# Patient Record
Sex: Female | Born: 1995 | Race: White | Hispanic: No | Smoking: Never smoker
Health system: Southern US, Community
[De-identification: ages and names within clinical notes are randomized; demographics above are authoritative.]

## PROBLEM LIST (undated history)

## (undated) DIAGNOSIS — I776 Arteritis, unspecified: Secondary | ICD-10-CM

## (undated) DIAGNOSIS — D219 Benign neoplasm of connective and other soft tissue, unspecified: Secondary | ICD-10-CM

## (undated) DIAGNOSIS — G901 Familial dysautonomia [Riley-Day]: Secondary | ICD-10-CM

## (undated) DIAGNOSIS — I499 Cardiac arrhythmia, unspecified: Secondary | ICD-10-CM

## (undated) DIAGNOSIS — R51 Headache: Secondary | ICD-10-CM

## (undated) DIAGNOSIS — T783XXA Angioneurotic edema, initial encounter: Secondary | ICD-10-CM

## (undated) DIAGNOSIS — L709 Acne, unspecified: Secondary | ICD-10-CM

## (undated) DIAGNOSIS — J45909 Unspecified asthma, uncomplicated: Secondary | ICD-10-CM

## (undated) DIAGNOSIS — J189 Pneumonia, unspecified organism: Secondary | ICD-10-CM

## (undated) DIAGNOSIS — L508 Other urticaria: Secondary | ICD-10-CM

## (undated) HISTORY — DX: Headache: R51

## (undated) HISTORY — PX: TOOTH EXTRACTION: SUR596

## (undated) HISTORY — DX: Angioneurotic edema, initial encounter: T78.3XXA

## (undated) HISTORY — DX: Other urticaria: L50.8

## (undated) HISTORY — DX: Acne, unspecified: L70.9

## (undated) HISTORY — DX: Unspecified asthma, uncomplicated: J45.909

## (undated) HISTORY — DX: Benign neoplasm of connective and other soft tissue, unspecified: D21.9

## (undated) HISTORY — PX: TONSILLECTOMY AND ADENOIDECTOMY: SUR1326

## (undated) HISTORY — DX: Arteritis, unspecified: I77.6

## (undated) HISTORY — PX: ADENOIDECTOMY: SUR15

## (undated) HISTORY — PX: BREAST LUMPECTOMY: SHX2

---
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
## 2015-01-17 ENCOUNTER — Other Ambulatory Visit: Payer: Self-pay | Admitting: Gastroenterology

## 2015-01-17 ENCOUNTER — Other Ambulatory Visit: Payer: Self-pay

## 2015-01-17 DIAGNOSIS — R109 Unspecified abdominal pain: Secondary | ICD-10-CM

## 2015-01-17 DIAGNOSIS — R634 Abnormal weight loss: Secondary | ICD-10-CM

## 2015-01-24 ENCOUNTER — Ambulatory Visit
Admission: RE | Admit: 2015-01-24 | Discharge: 2015-01-24 | Disposition: A | Discharge status: Home / Self Care | Payer: BLUE CROSS/BLUE SHIELD | Source: Ambulatory Visit

## 2015-01-24 DIAGNOSIS — R634 Abnormal weight loss: Secondary | ICD-10-CM

## 2015-01-24 DIAGNOSIS — R109 Unspecified abdominal pain: Secondary | ICD-10-CM

## 2015-01-24 MED ORDER — IOPAMIDOL (ISOVUE-300) INJECTION 61%
100.0000 mL | Freq: Once | INTRAVENOUS | Status: AC | PRN
  Administered 2015-01-24: 100 mL via INTRAVENOUS

## 2015-03-05 ENCOUNTER — Emergency Department
Admission: EM | Admit: 2015-03-05 | Discharge: 2015-03-05 | Disposition: A | Discharge status: Home / Self Care | Payer: BLUE CROSS/BLUE SHIELD | Attending: Emergency Medicine | Admitting: Emergency Medicine

## 2015-03-05 ENCOUNTER — Encounter: Payer: Self-pay | Admitting: *Deleted

## 2015-03-05 ENCOUNTER — Emergency Department: Payer: BLUE CROSS/BLUE SHIELD

## 2015-03-05 DIAGNOSIS — R002 Palpitations: Secondary | ICD-10-CM

## 2015-03-05 DIAGNOSIS — R1011 Right upper quadrant pain: Secondary | ICD-10-CM

## 2015-03-05 DIAGNOSIS — R55 Syncope and collapse: Secondary | ICD-10-CM | POA: Diagnosis present

## 2015-03-05 DIAGNOSIS — R42 Dizziness and giddiness: Secondary | ICD-10-CM | POA: Diagnosis not present

## 2015-03-05 DIAGNOSIS — Z793 Long term (current) use of hormonal contraceptives: Secondary | ICD-10-CM | POA: Diagnosis not present

## 2015-03-05 DIAGNOSIS — R1013 Epigastric pain: Secondary | ICD-10-CM | POA: Diagnosis not present

## 2015-03-05 DIAGNOSIS — Z79899 Other long term (current) drug therapy: Secondary | ICD-10-CM | POA: Insufficient documentation

## 2015-03-05 HISTORY — DX: Familial dysautonomia (riley-day): G90.1

## 2015-03-05 LAB — CBC WITH DIFFERENTIAL/PLATELET
Basophils Absolute: 0 10*3/uL (ref 0–0.1)
Basophils Relative: 0 %
Eosinophils Absolute: 0 10*3/uL (ref 0–0.7)
Eosinophils Relative: 0 %
HCT: 39.9 % (ref 35.0–47.0)
Hemoglobin: 13.8 g/dL (ref 12.0–16.0)
Lymphocytes Relative: 22 %
Lymphs Abs: 4.3 10*3/uL — ABNORMAL HIGH (ref 1.0–3.6)
MCH: 29.8 pg (ref 26.0–34.0)
MCHC: 34.5 g/dL (ref 32.0–36.0)
MCV: 86.3 fL (ref 80.0–100.0)
Monocytes Absolute: 1.2 10*3/uL — ABNORMAL HIGH (ref 0.2–0.9)
Monocytes Relative: 7 %
Neutro Abs: 13.6 10*3/uL — ABNORMAL HIGH (ref 1.4–6.5)
Neutrophils Relative %: 71 %
Platelets: 307 10*3/uL (ref 150–440)
RBC: 4.62 MIL/uL (ref 3.80–5.20)
RDW: 12.8 % (ref 11.5–14.5)
WBC: 19.2 10*3/uL — ABNORMAL HIGH (ref 3.6–11.0)

## 2015-03-05 LAB — BASIC METABOLIC PANEL
Anion gap: 10 (ref 5–15)
BUN: 11 mg/dL (ref 6–20)
CO2: 22 mmol/L (ref 22–32)
Calcium: 9 mg/dL (ref 8.9–10.3)
Chloride: 105 mmol/L (ref 101–111)
Creatinine, Ser: 0.76 mg/dL (ref 0.44–1.00)
GFR calc Af Amer: >60 mL/min (ref >60)
GFR calc non Af Amer: >60 mL/min (ref >60)
Glucose, Bld: 201 mg/dL — ABNORMAL HIGH (ref 65–99)
Potassium: 4.4 mmol/L (ref 3.5–5.1)
Sodium: 137 mmol/L (ref 135–145)

## 2015-03-05 LAB — HEPATIC FUNCTION PANEL
ALT: 10 U/L — ABNORMAL LOW (ref 14–54)
AST: 18 U/L (ref 15–41)
Albumin: 4.2 g/dL (ref 3.5–5.0)
Alkaline Phosphatase: 52 U/L (ref 38–126)
Bilirubin, Direct: 0.1 mg/dL (ref 0.1–0.5)
Indirect Bilirubin: 0.9 mg/dL (ref 0.3–0.9)
Total Bilirubin: 1 mg/dL (ref 0.3–1.2)
Total Protein: 7.4 g/dL (ref 6.5–8.1)

## 2015-03-05 LAB — TSH: TSH: 1.214 u[IU]/mL (ref 0.350–4.500)

## 2015-03-05 LAB — MAGNESIUM: Magnesium: 1.7 mg/dL (ref 1.7–2.4)

## 2015-03-05 LAB — LIPASE, BLOOD: Lipase: 26 U/L (ref 22–51)

## 2015-03-05 MED ORDER — GI COCKTAIL ~~LOC~~
30.0000 mL | Freq: Once | ORAL | Status: AC
  Administered 2015-03-05: 30 mL via ORAL

## 2015-03-05 MED ORDER — SODIUM CHLORIDE 0.9 % IV BOLUS (SEPSIS)
1000.0000 mL | Freq: Once | INTRAVENOUS | Status: AC
  Administered 2015-03-05: 1000 mL via INTRAVENOUS

## 2015-03-05 MED ORDER — GI COCKTAIL ~~LOC~~
ORAL | Status: AC
  Administered 2015-03-05: 30 mL via ORAL
  Filled 2015-03-05: qty 30

## 2015-03-05 MED ORDER — FAMOTIDINE IN NACL 20-0.9 MG/50ML-% IV SOLN
20.0000 mg | Freq: Once | INTRAVENOUS | Status: AC
  Administered 2015-03-05: 20 mg via INTRAVENOUS

## 2015-03-05 MED ORDER — SODIUM CHLORIDE 0.9 % IV BOLUS (SEPSIS): 1000.0000 mL | Freq: Once | INTRAVENOUS | Status: DC

## 2015-03-05 MED ORDER — FAMOTIDINE IN NACL 20-0.9 MG/50ML-% IV SOLN
INTRAVENOUS | Status: AC
  Administered 2015-03-05: 20 mg via INTRAVENOUS
  Filled 2015-03-05: qty 50

## 2015-03-05 NOTE — ED Notes (Signed)
Mother at bedside.

## 2015-03-05 NOTE — ED Notes (Signed)
Pt alert and in NAD at time of d/c to mother.

## 2015-03-05 NOTE — ED Notes (Signed)
Assisted pt to bedside commode, tolerated well. Steady gait.

## 2015-03-05 NOTE — ED Provider Notes (Signed)
[facility] Emergency Department Provider Note  ____________________________________________  Time seen: 7:55 PM  I have reviewed the triage vital signs and the nursing notes.   HISTORY  Chief Complaint Near Syncope    HPI Faith Jordan is a 19 y.o. female with a history of dysautonomia who reports that this morning she felt like she her heart was racing and she was having palpitations. She has had episodes of dizziness today. His feels somewhat like her dysautonomia. However, she is also have severe burning central chest pain that is nonradiating. No associated shortness of breath, diaphoresis or vomiting. It is not exertional or pleuritic. At work. The patient was feeling worse again and feeling lightheaded. She also felt like her throat was swelling. She had no known exposure to any known or potential allergens. A friend of hers administered an EpiPen, but the patient felt like it did not work, so EMS administered IM epinephrine. Afterward, the patient now complains of a headache.  The patient was starting a new job today and woke up feeling very anxious. She normally has to keep her still very well-hydrated and has forgotten to drink water all morning.   Past Medical History  Diagnosis Date  . Angioedema   . Acne   . Aquagenic angio-edema-urticaria   . Vasculitis   . Headache(784.0)   . Dysrhythmia     sinus tachycardia  . Asthma     no problems /not used recently  . Pneumonia     hx  6th grade  . Dysautonomia     There are no active problems to display for this patient.   Past Surgical History  Procedure Laterality Date  . Tonsillectomy and adenoidectomy    . Adenoidectomy    . Tooth extraction    . Mass excision Right 10/07/2014    Procedure: EXCISION OF RIGHT BREAST MASS;  Surgeon: Jackolyn Confer, MD;  Location: [facility];  Service: General;  Laterality: Right;  . Breast lumpectomy Right   . Breast lumpectomy Right     Current  Outpatient Rx  Name  Route  Sig  Dispense  Refill  . cetirizine (ZYRTEC) 10 MG chewable tablet   Oral   Chew 10 mg by mouth daily as needed (Angio Edema).          . diphenhydrAMINE (BENADRYL) 25 MG tablet   Oral   Take 50 mg by mouth daily as needed (Angio Edema). Takes with Zantac         . EPINEPHrine 0.3 mg/0.3 mL IJ SOAJ injection   Intramuscular   Inject into the muscle once.         . norgestimate-ethinyl estradiol (ORTHO-CYCLEN,SPRINTEC,PREVIFEM) 0.25-35 MG-MCG tablet   Oral   Take 1 tablet by mouth daily.         . Probiotic Product (PROBIOTIC DAILY PO)   Oral   Take 1 tablet by mouth.         . propranolol (INDERAL) 20 MG tablet   Oral   Take 20 mg by mouth daily.      4     Allergies Advil; Nsaids; and Sulfa antibiotics  Family History  Problem Relation Age of Onset  . Cancer Maternal Grandmother   . Cancer Maternal Grandfather   . Asthma Other   . Cancer Other   . Epilepsy Other     Social History History  Substance Use Topics  . Smoking status: Never Smoker   . Smokeless tobacco: Not on file  .  Alcohol Use: No    Review of Systems  Constitutional: No fever or chills. No weight changes Eyes:No blurry vision or double vision.  ENT: No sore throat. Cardiovascular: No chest pain. Respiratory: No dyspnea or cough. Gastrointestinal: Chronic GI upset and postprandial abdominal pain. She is seeing GI for this and started on probiotics with some improvement. No prior ultrasounds Genitourinary: Negative for dysuria, urinary retention, bloody urine, or difficulty urinating. Musculoskeletal: Negative for back pain. No joint swelling or pain. Skin: Negative for rash. Neurological: Negative for headaches, focal weakness or numbness. Psychiatric:No anxiety or depression.   Endocrine:No hot/cold intolerance, changes in energy, or sleep difficulty.  10-point ROS otherwise negative.  ____________________________________________   PHYSICAL  EXAM:  VITAL SIGNS: ED Triage Vitals  Enc Vitals Group     BP 03/05/15 1947 125/85 mmHg     Pulse Rate 03/05/15 1947 44     Resp --      Temp --      Temp src --      SpO2 03/05/15 1947 100 %     Weight 03/05/15 1947 120 lb (54.432 kg)     Height 03/05/15 1947 5\' 7"  (1.702 m)     Head Cir --      Peak Flow --      Pain Score --      Pain Loc --      Pain Edu? --      Excl. in [place]? --      Constitutional: Alert and oriented. Well appearing and in no distress. Eyes: No scleral icterus. No conjunctival pallor. PERRL. EOMI ENT   Head: Normocephalic and atraumatic.   Nose: No congestion/rhinnorhea. No septal hematoma   Mouth/Throat: MMM, no pharyngeal erythema. No peritonsillar mass. No uvula shift.   Neck: No stridor. No SubQ emphysema. No meningismus. Hematological/Lymphatic/Immunilogical: No cervical lymphadenopathy. Cardiovascular: RRR. Heart rate approximately 70 on my exam. Normal and symmetric distal pulses are present in all extremities. No murmurs, rubs, or gallops. Respiratory: Normal respiratory effort without tachypnea nor retractions. Breath sounds are clear and equal bilaterally. No wheezes/rales/rhonchi. Gastrointestinal: Right upper quadrant and epigastric tenderness. No distention. There is no CVA tenderness.  No rebound, rigidity, or guarding. Genitourinary: deferred Musculoskeletal: Nontender with normal range of motion in all extremities. No joint effusions.  No lower extremity tenderness.  No edema. Neurologic:   Normal speech and language.  CN 2-10 normal. Motor grossly intact. No pronator drift.  Normal gait. No gross focal neurologic deficits are appreciated.  Skin:  Skin is warm, dry and intact. No rash noted.  No petechiae, purpura, or bullae. Psychiatric: Mood and affect are normal. Speech and behavior are normal. Patient exhibits appropriate insight and judgment.  ____________________________________________    LABS (pertinent  positives/negatives) (all labs ordered are listed, but only abnormal results are displayed) Labs Reviewed  BASIC METABOLIC PANEL - Abnormal; Notable for the following:    Glucose, Bld 201 (*)    All other components within normal limits  HEPATIC FUNCTION PANEL - Abnormal; Notable for the following:    ALT 10 (*)    All other components within normal limits  CBC WITH DIFFERENTIAL/PLATELET - Abnormal; Notable for the following:    WBC 19.2 (*)    Neutro Abs 13.6 (*)    Lymphs Abs 4.3 (*)    Monocytes Absolute 1.2 (*)    All other components within normal limits  LIPASE, BLOOD  TSH  MAGNESIUM   ____________________________________________   EKG  Normal sinus rhythm, rate  61, normal axis and intervals. Incomplete right bundle-branch block, normal ST segments and T waves.  ____________________________________________    RADIOLOGY  Ultrasound right upper quadrant unremarkable  ____________________________________________   PROCEDURES  ____________________________________________   INITIAL IMPRESSION / ASSESSMENT AND PLAN / ED COURSE  Pertinent labs & imaging results that were available during my care of the patient were reviewed by me and considered in my medical decision making (see chart for details).  Patient presents with palpitations chest pain and upper abdominal tenderness.  Check IV fluids. Labs and ultrasound of the gallbladder. I'll give her a GI cocktail and reassess.  ----------------------------------------- 11:40 PM on 03/05/2015 -----------------------------------------  Workup unremarkable. Patient feels much better currently. Low suspicion for ectopic or other pregnancy complication. She has regular menses. Will follow-up with her cardiologist for continued monitoring of her symptoms and continue her usual beta blocker therapy. Dysautonomia syndrome, likely exacerbated due to the acute stress of starting a new job and  dehydration.  ____________________________________________   FINAL CLINICAL IMPRESSION(S) / ED DIAGNOSES  Final diagnoses:  Palpitations  Dizziness      Carrie Mew, MD 03/05/15 [phone]

## 2015-03-05 NOTE — Discharge Instructions (Signed)
Palpitations A palpitation is the feeling that your heartbeat is irregular or is faster than normal. It may feel like your heart is fluttering or skipping a beat. Palpitations are usually not a serious problem. However, in some cases, you may need further medical evaluation. CAUSES  Palpitations can be caused by:  Smoking.  Caffeine or other stimulants, such as diet pills or energy drinks.  Alcohol.  Stress and anxiety.  Strenuous physical activity.  Fatigue.  Certain medicines.  Heart disease, especially if you have a history of irregular heart rhythms (arrhythmias), such as atrial fibrillation, atrial flutter, or supraventricular tachycardia.  An improperly working pacemaker or defibrillator. DIAGNOSIS  To find the cause of your palpitations, your health care provider will take your medical history and perform a physical exam. Your health care provider may also have you take a test called an ambulatory electrocardiogram (ECG). An ECG records your heartbeat patterns over a 24-hour period. You may also have other tests, such as:  Transthoracic echocardiogram (TTE). During echocardiography, sound waves are used to evaluate how blood flows through your heart.  Transesophageal echocardiogram (TEE).  Cardiac monitoring. This allows your health care provider to monitor your heart rate and rhythm in real time.  Holter monitor. This is a portable device that records your heartbeat and can help diagnose heart arrhythmias. It allows your health care provider to track your heart activity for several days, if needed.  Stress tests by exercise or by giving medicine that makes the heart beat faster. TREATMENT  Treatment of palpitations depends on the cause of your symptoms and can vary greatly. Most cases of palpitations do not require any treatment other than time, relaxation, and monitoring your symptoms. Other causes, such as atrial fibrillation, atrial flutter, or supraventricular  tachycardia, usually require further treatment. HOME CARE INSTRUCTIONS   Avoid:  Caffeinated coffee, tea, soft drinks, diet pills, and energy drinks.  Chocolate.  Alcohol.  Stop smoking if you smoke.  Reduce your stress and anxiety. Things that can help you relax include:  A method of controlling things in your body, such as your heartbeats, with your mind (biofeedback).  Yoga.  Meditation.  Physical activity such as swimming, jogging, or walking.  Get plenty of rest and sleep. SEEK MEDICAL CARE IF:   You continue to have a fast or irregular heartbeat beyond 24 hours.  Your palpitations occur more often. SEEK IMMEDIATE MEDICAL CARE IF:  You have chest pain or shortness of breath.  You have a severe headache.  You feel dizzy or you faint. MAKE SURE YOU:  Understand these instructions.  Will watch your condition.  Will get help right away if you are not doing well or get worse. Document Released: 10/01/2000 Document Revised: 10/09/2013 Document Reviewed: 12/03/2011 [facility] Patient Information 2015 [place], [place]. This information is not intended to replace advice given to you by your health care provider. Make sure you discuss any questions you have with your health care provider.  Near-Syncope Near-syncope (commonly known as near fainting) is sudden weakness, dizziness, or feeling like you might pass out. This can happen when getting up or while standing for a long time. It is caused by a sudden decrease in blood flow to the brain, which can occur for various reasons. Most of the reasons are not serious.  HOME CARE Watch your condition for any changes.  Have someone stay with you until you feel stable.  If you feel like you are going to pass out:  Lie down right away.  Prop your feet up if you can.  Breathe deeply and steadily.  Move only when the feeling has gone away. Most of the time, this feeling lasts only a few minutes. You may feel tired for  several hours.  Drink enough fluids to keep your pee (urine) clear or pale yellow.  If you are taking blood pressure or heart medicine, stand up slowly.  Follow up with your doctor as told. GET HELP RIGHT AWAY IF:   You have a severe headache.  You have unusual pain in the chest, belly (abdomen), or back.  You have bleeding from the mouth or butt (rectum), or you have black or tarry poop (stool).  You feel your heart beat differently than normal, or you have a very fast pulse.  You pass out, or you twitch and shake when you pass out.  You pass out when sitting or lying down.  You feel confused.  You have trouble walking.  You are weak.  You have vision problems. MAKE SURE YOU:   Understand these instructions.  Will watch your condition.  Will get help right away if you are not doing well or get worse. Document Released: 03/22/2008 Document Revised: 10/09/2013 Document Reviewed: 03/09/2013 [facility] Patient Information 2015 [place], [place]. This information is not intended to replace advice given to you by your health care provider. Make sure you discuss any questions you have with your health care provider.

## 2015-03-05 NOTE — ED Notes (Addendum)
Pt reports waking up this am and feeling like heart was "racing and irregular". Pt reports relaxing and then at work this afternoon pt began sweating and becoming dizzy. Pt sat down and denies LOC or hitting head. Pt reports hx of dysautonomia with sinus tachycardia. Pt reports the near syncope is similar to episodes in the past, however reports new onset of headache and a burning feeling in throat which is not like episodes in the past. Pts co-worker administered pts Epipen on scene and EMS reports pt stated she did not feel the Epipen "went in" EMS then administered 0.5 of Epi IM. Pt alert and oriented upon arrival.

## 2015-03-05 NOTE — ED Notes (Signed)
Pt to US.

## 2015-04-22 ENCOUNTER — Ambulatory Visit (INDEPENDENT_AMBULATORY_CARE_PROVIDER_SITE_OTHER): Payer: BLUE CROSS/BLUE SHIELD | Admitting: Cardiovascular Disease

## 2015-04-22 ENCOUNTER — Encounter: Payer: Self-pay | Admitting: Cardiovascular Disease

## 2015-04-22 VITALS — BP 102/62 | HR 65 | Ht 66.0 in | Wt 126.6 lb

## 2015-04-22 DIAGNOSIS — G901 Familial dysautonomia [Riley-Day]: Secondary | ICD-10-CM | POA: Diagnosis not present

## 2015-04-22 NOTE — Patient Instructions (Signed)
Dr Berry recommends that you schedule a follow-up appointment in 1 year. You will receive a reminder letter in the mail two months in advance. If you don't receive a letter, please call our office to schedule the follow-up appointment. 

## 2015-04-22 NOTE — Progress Notes (Signed)
04/22/2015 Faith Jordan   [birth year]  [id number]  Primary Physician Deforest Hoyles, MD Primary Cardiologist: Lorretta Harp MD Renae Gloss   HPI:  Faith Jordan is a delightful  19 year old fit appearing single Caucasian female who is transitioning her care from her pediatric cardiologist, Dr. Darcus Austin at [facility], to myself. She is a copied by her mother who is a patient of mine as well. 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 She is controlled on low-dose propranolol.   Current Outpatient Prescriptions  Medication Sig Dispense Refill  . cetirizine (ZYRTEC) 10 MG chewable tablet Chew 10 mg by mouth daily as needed (Angio Edema).     . diphenhydrAMINE (BENADRYL) 25 MG tablet Take 50 mg by mouth daily as needed (Angio Edema). Takes with Zantac    . EPINEPHrine 0.3 mg/0.3 mL IJ SOAJ injection Inject into the muscle once.    . norgestimate-ethinyl estradiol (ORTHO-CYCLEN,SPRINTEC,PREVIFEM) 0.25-35 MG-MCG tablet Take 1 tablet by mouth daily.    . Probiotic Product (PROBIOTIC DAILY PO) Take 1 tablet by mouth.    . propranolol (INDERAL) 20 MG tablet Take 20 mg by mouth daily.  4   No current facility-administered medications for this visit.    Allergies  Allergen Reactions  . 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 Nsaids Dermatitis    Reaction similar to Stephens-Johnson, blistering peeling rash  . Sulfa Antibiotics Swelling    Facial swelling    History   Social History  . Marital Status: Single    Spouse Name: N/A  . Number of Children: N/A  . Years of Education: N/A   Occupational History  . Not on file.   Social History Main Topics  . Smoking status: Never Smoker   . Smokeless tobacco: Not on file  . Alcohol Use: No  . Drug Use: No  . Sexual Activity: Not on file   Other Topics  Concern  . Not on file   Social History Narrative     Review of Systems: General: negative for chills, fever, night sweats or weight changes.  Cardiovascular: negative for chest pain, dyspnea on exertion, edema, orthopnea, palpitations, paroxysmal nocturnal dyspnea or shortness of breath Dermatological: negative for rash Respiratory: negative for cough or wheezing Urologic: negative for hematuria Abdominal: negative for nausea, vomiting, diarrhea, bright red blood per rectum, melena, or hematemesis Neurologic: negative for visual changes, syncope, or dizziness All other systems reviewed and are otherwise negative except as noted above.    Blood pressure 102/62, pulse 65, height 5\' 6"  (1.676 m), weight 126 lb 9.6 oz (57.425 kg).  General appearance: alert and no distress Neck: no adenopathy, no carotid bruit, no JVD, supple, symmetrical, trachea midline and thyroid not enlarged, symmetric, no tenderness/mass/nodules Lungs: clear to auscultation bilaterally Heart: regular rate and rhythm, S1, S2 normal, no murmur, click, rub or gallop Extremities: extremities normal, atraumatic, no cyanosis or edema  EKG normal/65 without ST or T-wave changes. I personally reviewed this EKG  ASSESSMENT AND PLAN:   Dysautonomia, familial Faith Jordan is a 19 year old thin and fit appearing single Caucasian female whose mother is also a patient of mine and his accompanying her today. She is transitioning from her pediatric cardiologist, Dr. Darcus Austin, to me for ongoing coronary vascular care. She is a history of dysautonomia diagnosed several years ago with inappropriate sinus tachycardia. She is  on low-dose propranolol and is clinically controlled. She is aware of the importance of hydration.      Lorretta Harp MD FACP,FACC,FAHA, [facility] 04/22/2015 11:50 AM

## 2015-04-22 NOTE — Assessment & Plan Note (Signed)
Faith Jordan is a 19 year old thin and fit appearing single Caucasian female whose mother is also a patient of mine and his accompanying her today. She is transitioning from her pediatric cardiologist, Dr. Darcus Austin, to me for ongoing coronary vascular care. She is a history of dysautonomia diagnosed several years ago with inappropriate sinus tachycardia. She is on low-dose propranolol and is clinically controlled. She is aware of the importance of hydration.

## 2015-11-21 ENCOUNTER — Telehealth: Payer: Self-pay | Admitting: *Deleted

## 2015-11-21 ENCOUNTER — Telehealth: Payer: Self-pay | Admitting: Cardiovascular Disease

## 2015-11-21 ENCOUNTER — Encounter: Payer: Self-pay | Admitting: *Deleted

## 2015-11-21 ENCOUNTER — Ambulatory Visit (INDEPENDENT_AMBULATORY_CARE_PROVIDER_SITE_OTHER): Payer: BLUE CROSS/BLUE SHIELD | Admitting: *Deleted

## 2015-11-21 VITALS — BP 110/80 | HR 73

## 2015-11-21 DIAGNOSIS — R9431 Abnormal electrocardiogram [ECG] [EKG]: Secondary | ICD-10-CM

## 2015-11-21 NOTE — Progress Notes (Signed)
1.) Reason for visit: patient had EKG  X 3  EARLIER TODAY IN CLINICAL AT [place],  patient concerned will like to have one by office to make sure everything is okay,    2.) Name of MD requesting visit: Dr Sallyanne Kuster  3.) H&P  4.) ROS related to problem no chest pain noted , or other symptoms.,only a headache  5.) Assessment and plan per MD: EKG OBTAINED, REVIEWED AND EKG NORMAL

## 2015-11-21 NOTE — Telephone Encounter (Signed)
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 Mother states only issue patient has been having headaches for the last 3-4 weeks Being evaluated by primary and will be seeing a neurologist  patient will come to office today for ekg at 2:30 pm

## 2015-11-21 NOTE — Telephone Encounter (Signed)
Pt's mother called stating that the pt was in class performing EKG's and the test that ws ran on her came out abnormal 3 times but when the same machine was used on someone else it came out normal. Pt is on the way home from [facility] wanting to have one done in the office . Please f/u with mother asap.   Thanks

## 2015-12-22 DIAGNOSIS — G901 Familial dysautonomia [Riley-Day]: Secondary | ICD-10-CM | POA: Insufficient documentation

## 2015-12-22 DIAGNOSIS — L509 Urticaria, unspecified: Secondary | ICD-10-CM | POA: Insufficient documentation

## 2015-12-22 DIAGNOSIS — N6489 Other specified disorders of breast: Secondary | ICD-10-CM | POA: Insufficient documentation

## 2015-12-22 DIAGNOSIS — D241 Benign neoplasm of right breast: Secondary | ICD-10-CM | POA: Insufficient documentation

## 2015-12-22 DIAGNOSIS — R000 Tachycardia, unspecified: Secondary | ICD-10-CM | POA: Insufficient documentation

## 2015-12-22 HISTORY — DX: Other specified disorders of breast: N64.89

## 2015-12-22 HISTORY — DX: Urticaria, unspecified: L50.9

## 2015-12-24 NOTE — Telephone Encounter (Signed)
OPENED IN ERROR

## 2015-12-29 DIAGNOSIS — R519 Headache, unspecified: Secondary | ICD-10-CM | POA: Insufficient documentation

## 2016-04-27 DIAGNOSIS — T783XXA Angioneurotic edema, initial encounter: Secondary | ICD-10-CM

## 2016-04-27 HISTORY — DX: Angioneurotic edema, initial encounter: T78.3XXA

## 2016-05-07 ENCOUNTER — Ambulatory Visit (INDEPENDENT_AMBULATORY_CARE_PROVIDER_SITE_OTHER): Payer: BLUE CROSS/BLUE SHIELD | Admitting: Cardiovascular Disease

## 2016-05-07 ENCOUNTER — Encounter: Payer: Self-pay | Admitting: Cardiovascular Disease

## 2016-05-07 DIAGNOSIS — R002 Palpitations: Secondary | ICD-10-CM | POA: Diagnosis not present

## 2016-05-07 NOTE — Progress Notes (Signed)
05/07/2016 Faith Jordan   09/22/1996  [id number]  Primary Physician Deforest Hoyles, MD Primary Cardiologist: Lorretta Harp MD Renae Gloss  HPI:  Faith Jordan is a delightful 20 year old fit appearing single Caucasian female who is transitioning her care from her pediatric cardiologist, Dr. Darcus Austin at [facility], to myself. She is a copied by her mother who is a patient of mine as well. I last saw her in the office 04/22/15. 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 She was diagnosed with dysautonomia to 4 years ago because of inappropriate sinus tachycardia. She is controlled on low-dose propranolol.   Current Outpatient Prescriptions  Medication Sig Dispense Refill  . diphenhydrAMINE (BENADRYL) 25 MG tablet Take 50 mg by mouth daily as needed (Angio Edema). Takes with Zantac    . EPINEPHrine 0.3 mg/0.3 mL IJ SOAJ injection Inject into the muscle once.    . magnesium oxide (MAG-OX) 400 MG tablet Take 200 mg by mouth daily.    . norgestimate-ethinyl estradiol (ORTHO-CYCLEN,SPRINTEC,PREVIFEM) 0.25-35 MG-MCG tablet Take 1 tablet by mouth daily.    . nortriptyline (PAMELOR) 10 MG capsule Take 10 mg by mouth daily.    . propranolol (INDERAL) 20 MG tablet Take 20 mg by mouth daily.  4  . SUMAtriptan (IMITREX) 100 MG tablet Take 50 mg by mouth as needed.     No current facility-administered medications for this visit.    Allergies  Allergen Reactions  . 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 Nsaids Dermatitis    Reaction similar to Stephens-Johnson, blistering peeling rash  . Sulfa Antibiotics Swelling    Facial swelling    Social History   Social History  . Marital Status: Single    Spouse Name: N/A  . Number of Children: N/A  . Years of Education: N/A   Occupational History  . Not on file.   Social History Main Topics  . Smoking status: Never Smoker   . Smokeless tobacco: Not on file  .  Alcohol Use: No  . Drug Use: No  . Sexual Activity: Not on file   Other Topics Concern  . Not on file   Social History Narrative     Review of Systems: General: negative for chills, fever, night sweats or weight changes.  Cardiovascular: negative for chest pain, dyspnea on exertion, edema, orthopnea, palpitations, paroxysmal nocturnal dyspnea or shortness of breath Dermatological: negative for rash Respiratory: negative for cough or wheezing Urologic: negative for hematuria Abdominal: negative for nausea, vomiting, diarrhea, bright red blood per rectum, melena, or hematemesis Neurologic: negative for visual changes, syncope, or dizziness All other systems reviewed and are otherwise negative except as noted above.    Blood pressure 108/80, pulse 65, height 5\' 7"  (1.702 m), weight 131 lb 12.8 oz (59.784 kg), SpO2 99 %.  General appearance: alert and no distress Neck: no adenopathy, no carotid bruit, no JVD, supple, symmetrical, trachea midline and thyroid not enlarged, symmetric, no tenderness/mass/nodules Lungs: clear to auscultation bilaterally Heart: regular rate and rhythm, S1, S2 normal, no murmur, click, rub or gallop Extremities: extremities normal, atraumatic, no cyanosis or edema  EKG normal sinus rhythm at 65 without ST or T-wave changes. I personally reviewed this EKG  ASSESSMENT AND PLAN:   Dysautonomia, familial History of familial dysautonomia on propranolol and nortriptyline. She has not had any episodes since August of last year and her heart rate and blood pressure has been fairly  stable      Lorretta Harp MD FACP,FACC,FAHA, [facility] 05/07/2016 10:08 AM

## 2016-05-07 NOTE — Patient Instructions (Signed)
Medication Instructions:  Your physician recommends that you continue on your current medications as directed. Please refer to the Current Medication list given to you today.   Follow-Up: Your physician wants you to follow-up in: [place]. You will receive a reminder letter in the mail two months in advance. If you don't receive a letter, please call our office to schedule the follow-up appointment.   If you need a refill on your cardiac medications before your next appointment, please call your pharmacy.

## 2016-05-07 NOTE — Assessment & Plan Note (Signed)
History of familial dysautonomia on propranolol and nortriptyline. She has not had any episodes since August of last year and her heart rate and blood pressure has been fairly stable

## 2016-07-02 ENCOUNTER — Encounter: Payer: Self-pay | Admitting: Emergency Medicine

## 2016-07-02 ENCOUNTER — Emergency Department
Admission: EM | Admit: 2016-07-02 | Discharge: 2016-07-02 | Disposition: A | Discharge status: Home / Self Care | Payer: BLUE CROSS/BLUE SHIELD | Attending: Student in an Organized Health Care Education/Training Program | Admitting: Student in an Organized Health Care Education/Training Program

## 2016-07-02 DIAGNOSIS — G43909 Migraine, unspecified, not intractable, without status migrainosus: Secondary | ICD-10-CM | POA: Diagnosis present

## 2016-07-02 DIAGNOSIS — G43001 Migraine without aura, not intractable, with status migrainosus: Secondary | ICD-10-CM | POA: Diagnosis not present

## 2016-07-02 DIAGNOSIS — Z79899 Other long term (current) drug therapy: Secondary | ICD-10-CM | POA: Diagnosis not present

## 2016-07-02 DIAGNOSIS — J45909 Unspecified asthma, uncomplicated: Secondary | ICD-10-CM | POA: Insufficient documentation

## 2016-07-02 MED ORDER — BUTALBITAL-APAP-CAFFEINE 50-325-40 MG PO TABS
2.0000 | ORAL_TABLET | Freq: Once | ORAL | Status: AC
  Administered 2016-07-02: 2 via ORAL
  Filled 2016-07-02: qty 2

## 2016-07-02 MED ORDER — METOCLOPRAMIDE HCL 5 MG/ML IJ SOLN
10.0000 mg | Freq: Once | INTRAMUSCULAR | Status: AC
  Administered 2016-07-02: 10 mg via INTRAVENOUS
  Filled 2016-07-02: qty 2

## 2016-07-02 MED ORDER — BUTALBITAL-APAP-CAFFEINE 50-325-40 MG PO TABS: 1.0000 | ORAL_TABLET | Freq: Four times a day (QID) | ORAL | 0 refills | Status: DC | PRN

## 2016-07-02 MED ORDER — DIPHENHYDRAMINE HCL 50 MG/ML IJ SOLN
12.5000 mg | Freq: Once | INTRAMUSCULAR | Status: AC
  Administered 2016-07-02: 12.5 mg via INTRAVENOUS
  Filled 2016-07-02: qty 1

## 2016-07-02 NOTE — ED Triage Notes (Signed)
Patient presents to the ED with migraine x 1 week.  Patient reports history of migraines and reports taking medication for migraines but states this migraine has not improved with medication.  Patient states it feels like a typical migraine except that it has persisted x 1 week.  Patient ambulatory to triage with no obvious distress.

## 2016-07-02 NOTE — ED Provider Notes (Signed)
[facility] Emergency Department Provider Note  ____________________________________________  Time seen: Approximately 2:11 PM  I have reviewed the triage vital signs and the nursing notes.   HISTORY  Chief Complaint Migraine    HPI Faith Jordan is a 20 y.o. female presents with complaints of having a migraine headache 1 week. Patient states that this migraine has not improved with her normal medications. Feels like it's part of her normal migraines is not the worst migraine of her life. Denies any nausea or vomiting. Denies any photophobia. Patient reports that the pain is located right behind and in between the eyes.   Past Medical History:  Diagnosis Date  . Acne   . Angioedema   . Aquagenic angio-edema-urticaria   . Asthma    no problems /not used recently  . Dysautonomia   . Dysrhythmia    sinus tachycardia  . Headache(784.0)   . Pneumonia    hx  6th grade  . Vasculitis [facility])     Patient Active Problem List   Diagnosis Date Noted  . Angio-edema 04/27/2016  . Dysautonomia, familial ([place]) 04/22/2015  . 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 H/O disease 05/09/2013  . Neurocardiogenic syncope 04/06/2013  . Intermittent palpitations 04/06/2013    Past Surgical History:  Procedure Laterality Date  . ADENOIDECTOMY    . BREAST LUMPECTOMY Right   . BREAST LUMPECTOMY Right   . MASS EXCISION Right 10/07/2014   Procedure: EXCISION OF RIGHT BREAST MASS;  Surgeon: Jackolyn Confer, MD;  Location: [facility];  Service: General;  Laterality: Right;  . TONSILLECTOMY AND ADENOIDECTOMY    . TOOTH EXTRACTION      Prior to Admission medications   Medication Sig Start Date End Date Taking? Authorizing Provider  butalbital-acetaminophen-caffeine (FIORICET, ESGIC) [phone] MG tablet Take 1-2 tablets by mouth every 6 (six) hours as needed for headache. 07/02/16   Arlyss Repress, PA-C  diphenhydrAMINE (BENADRYL) 25 MG tablet Take 50 mg by  mouth daily as needed (Angio Edema). Takes with Zantac    Historical Provider, MD  EPINEPHrine 0.3 mg/0.3 mL IJ SOAJ injection Inject into the muscle once.    Historical Provider, MD  magnesium oxide (MAG-OX) 400 MG tablet Take 200 mg by mouth daily.    Historical Provider, MD  norgestimate-ethinyl estradiol (ORTHO-CYCLEN,SPRINTEC,PREVIFEM) 0.25-35 MG-MCG tablet Take 1 tablet by mouth daily.    Historical Provider, MD  nortriptyline (PAMELOR) 10 MG capsule Take 10 mg by mouth daily. 03/02/16   Historical Provider, MD  propranolol (INDERAL) 20 MG tablet Take 20 mg by mouth daily. 07/19/14   Historical Provider, MD  SUMAtriptan (IMITREX) 100 MG tablet Take 50 mg by mouth as needed. 04/06/16   Historical Provider, MD    Allergies Advil [ibuprofen]; Nsaids; and Sulfa antibiotics  Family History  Problem Relation Age of Onset  . Cancer Maternal Grandmother   . Cancer Maternal Grandfather   . Asthma Other   . Cancer Other   . Epilepsy Other     Social History Social History  Substance Use Topics  . Smoking status: Never Smoker  . Smokeless tobacco: Never Used  . Alcohol use No    Review of Systems Constitutional: No fever/chills Eyes: No visual changes. ENT: No sore throat. Cardiovascular: Denies chest pain. Respiratory: Denies shortness of breath. Gastrointestinal: No abdominal pain.  No nausea, no vomiting.  No diarrhea.  No constipation. Musculoskeletal: Negative for back pain. Skin: Negative for rash. Neurological: Positive for headaches, focal weakness or numbness.  10-point ROS otherwise negative.  ____________________________________________   PHYSICAL EXAM:  VITAL SIGNS: ED Triage Vitals  Enc Vitals Group     BP 07/02/16 1342 120/75     Pulse Rate 07/02/16 1342 82     Resp 07/02/16 1342 20     Temp 07/02/16 1342 98 F (36.7 C)     Temp Source 07/02/16 1342 Oral     SpO2 07/02/16 1342 98 %     Weight 07/02/16 1343 130 lb (59 kg)     Height 07/02/16 1343 5\' 7"   (1.702 m)     Head Circumference --      Peak Flow --      Pain Score 07/02/16 1343 8     Pain Loc --      Pain Edu? --      Excl. in [place]? --     Constitutional: Alert and oriented. Well appearing and in no acute distress. Eyes: Conjunctivae are normal. PERRL. EOMI.Fundi unremarkable Head: Atraumatic. Nose: No congestion/rhinnorhea. Mouth/Throat: Mucous membranes are moist.  Oropharynx non-erythematous. Neck: No stridor.   Cardiovascular: Normal rate, regular rhythm. Grossly normal heart sounds.  Good peripheral circulation. Respiratory: Normal respiratory effort.  No retractions. Lungs CTAB. Gastrointestinal: Soft and nontender. No distention. No CVA tenderness. Musculoskeletal: No lower extremity tenderness nor edema.  No joint effusions. Neurologic:  Normal speech and language. No gross focal neurologic deficits are appreciated. No gait instability. Skin:  Skin is warm, dry and intact. No rash noted. Psychiatric: Mood and affect are normal. Speech and behavior are normal.  ____________________________________________   LABS (all labs ordered are listed, but only abnormal results are displayed)  Labs Reviewed - No data to display ____________________________________________  EKG   ____________________________________________  RADIOLOGY   ____________________________________________   PROCEDURES  Procedure(s) performed: None  Critical Care performed: No  ____________________________________________   INITIAL IMPRESSION / ASSESSMENT AND PLAN / ED COURSE  Pertinent labs & imaging results that were available during my care of the patient were reviewed by me and considered in my medical decision making (see chart for details). Review of the [place] CSRS was performed in accordance of the [place] prior to dispensing any controlled drugs.  Patient was given Benadryl Reglan IV and 2 Fioricet. Patient states improvement in her headache symptoms discharged home with a  prescription for Fioricet. She voices no other emergency medical complaints this time.  Clinical Course    ____________________________________________   FINAL CLINICAL IMPRESSION(S) / ED DIAGNOSES  Final diagnoses:  Migraine without aura and with status migrainosus, not intractable     This chart was dictated using voice recognition software/Dragon. Despite best efforts to proofread, errors can occur which can change the meaning. Any change was purely unintentional.    Arlyss Repress, PA-C 07/02/16 1600    Merlyn Lot, MD 07/03/16 1057

## 2016-10-22 ENCOUNTER — Other Ambulatory Visit: Payer: Self-pay | Admitting: Cardiovascular Disease

## 2016-10-22 NOTE — Telephone Encounter (Signed)
Follow up   Pt verbalized that she needs a new prescription propranolol 20mg  written by Avelina Laine

## 2017-04-08 ENCOUNTER — Other Ambulatory Visit: Payer: Self-pay | Admitting: Cardiovascular Disease

## 2017-04-15 ENCOUNTER — Encounter: Payer: Self-pay | Admitting: Cardiovascular Disease

## 2017-04-15 ENCOUNTER — Ambulatory Visit (INDEPENDENT_AMBULATORY_CARE_PROVIDER_SITE_OTHER): Payer: BLUE CROSS/BLUE SHIELD | Admitting: Cardiovascular Disease

## 2017-04-15 VITALS — BP 106/64 | HR 97 | Ht 67.0 in | Wt 130.2 lb

## 2017-04-15 DIAGNOSIS — G901 Familial dysautonomia [Riley-Day]: Secondary | ICD-10-CM

## 2017-04-15 NOTE — Progress Notes (Signed)
04/15/2017 PAIDYN MCFERRAN   15-Oct-1996  [id number]  Primary Physician Karleen Dolphin, MD Primary Cardiologist: Lorretta Harp MD Renae Gloss  HPI:  Faith Jordan is a delightful 21 year old fit appearing single Caucasian female who is transitioning her care from her pediatric cardiologist, Dr. Darcus Austin at [facility], to myself. She is Accompanied by her mother who is a patient of mine as well. I last saw her in the office 05/07/16. 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 She is controlled on low-dose propranolol. 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 Over the last several weeks she's noticed persistently elevated heart rates compared to prior to that although she is relatively a symptomatically.   Current Outpatient Prescriptions  Medication Sig Dispense Refill  . BACLOFEN PO Take 50 mg by mouth as directed.    . diphenhydrAMINE (BENADRYL) 25 MG tablet Take 50 mg by mouth daily as needed (Angio Edema). Takes with Zantac    . doxycycline (VIBRA-TABS) 100 MG tablet Take 1 tablet by mouth daily.  3  . EPINEPHrine 0.3 mg/0.3 mL IJ SOAJ injection Inject into the muscle once.    Marland Kitchen imipramine (TOFRANIL) 25 MG tablet Take 1 tablet by mouth daily.  3  . norgestimate-ethinyl estradiol (ORTHO-CYCLEN,SPRINTEC,PREVIFEM) 0.25-35 MG-MCG tablet Take 1 tablet by mouth daily.    . promethazine (PHENERGAN) 25 MG tablet Take 25 mg by mouth as directed.     No current facility-administered medications for this visit.     Allergies  Allergen Reactions  . 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 Nsaids Dermatitis    Reaction similar to Stephens-Johnson, blistering peeling rash  . Sulfa Antibiotics Swelling    Facial swelling    Social History   Social History    . Marital status: Single    Spouse name: N/A  . Number of children: N/A  . Years of education: N/A   Occupational History  . Not on file.   Social History Main Topics  . Smoking status: Never Smoker  . Smokeless tobacco: Never Used  . Alcohol use No  . Drug use: No  . Sexual activity: Not on file   Other Topics Concern  . Not on file   Social History Narrative  . No narrative on file     Review of Systems: General: negative for chills, fever, night sweats or weight changes.  Cardiovascular: negative for chest pain, dyspnea on exertion, edema, orthopnea, palpitations, paroxysmal nocturnal dyspnea or shortness of breath Dermatological: negative for rash Respiratory: negative for cough or wheezing Urologic: negative for hematuria Abdominal: negative for nausea, vomiting, diarrhea, bright red blood per rectum, melena, or hematemesis Neurologic: negative for visual changes, syncope, or dizziness All other systems reviewed and are otherwise negative except as noted above.    Blood pressure 106/64, pulse 97.  General appearance: alert and no distress Neck: no adenopathy, no carotid bruit, no JVD, supple, symmetrical, trachea midline and thyroid not enlarged, symmetric, no tenderness/mass/nodules Lungs: clear to auscultation bilaterally Heart: regular rate and rhythm, S1, S2 normal, no murmur, click, rub or gallop Extremities: extremities normal, atraumatic, no cyanosis or edema  EKG Sinus rhythm 97 without ST or T-wave changes. I  Personally reviewed this EKG.  ASSESSMENT AND PLAN:   Dysautonomia, familial  Kimberle returns now after being seen 2 years ago for dysautonomia and an inappropriate sinus tachycardia. She has a low-dose propranolol. Over the last several weeks she's noticed her heart rate has been persistently elevated compared to prior to that although she is relatively asymptomatic. We will see her back again in 3-4 months to further evaluate.      Lorretta Harp MD FACP,FACC,FAHA, [facility] 04/15/2017 2:44 PM

## 2017-04-15 NOTE — Assessment & Plan Note (Signed)
Faith Jordan returns now after being seen 2 years ago for dysautonomia and an inappropriate sinus tachycardia. She has a low-dose propranolol. Over the last several weeks she's noticed her heart rate has been persistently elevated compared to prior to that although she is relatively asymptomatic. We will see her back again in 3-4 months to further evaluate.

## 2017-04-15 NOTE — Patient Instructions (Signed)
Medication Instructions: Your physician recommends that you continue on your current medications as directed. Please refer to the Current Medication list given to you today.  Follow-Up: Your physician recommends that you schedule a follow-up appointment in: 3-4 months.  If you need a refill on your cardiac medications before your next appointment, please call your pharmacy.

## 2017-05-02 ENCOUNTER — Telehealth: Payer: Self-pay | Admitting: Cardiovascular Disease

## 2017-05-02 DIAGNOSIS — G901 Familial dysautonomia [Riley-Day]: Secondary | ICD-10-CM

## 2017-05-02 DIAGNOSIS — R002 Palpitations: Secondary | ICD-10-CM

## 2017-05-02 NOTE — Telephone Encounter (Signed)
New message       Pt was seen 1 month ago.  She is still having tachycardia---heart rate is up in the 140's with sob. Dr Gwenlyn Found talked about maybe having pt wear a monitor.  She is calling to see if she can go ahead and get the monitor put on.  Please call

## 2017-05-02 NOTE — Telephone Encounter (Signed)
Please order an event monitor

## 2017-05-02 NOTE — Telephone Encounter (Signed)
Per review of last MD note on 04/15/17 - no mention of heart monitor.   Returned call to patient. Her HR has been elevated for about 1 week - 120s - 140 - she has chest tightness & shortness of breath. She states Dr. Gwenlyn Found mentioned to her about wearing a heart monitor before any med adjustments would be made. Advised would need to send him a message regarding order & she would be notified.

## 2017-05-02 NOTE — Telephone Encounter (Signed)
Event monitor ordered and staff message sent to [facility] scheduling pool to contact patient to arrange appt.

## 2017-05-05 ENCOUNTER — Encounter (HOSPITAL_COMMUNITY): Payer: Self-pay | Admitting: Emergency Medicine

## 2017-05-05 ENCOUNTER — Telehealth: Payer: Self-pay | Admitting: Cardiovascular Disease

## 2017-05-05 ENCOUNTER — Encounter (INDEPENDENT_AMBULATORY_CARE_PROVIDER_SITE_OTHER): Payer: BLUE CROSS/BLUE SHIELD

## 2017-05-05 ENCOUNTER — Emergency Department (HOSPITAL_COMMUNITY): Payer: BLUE CROSS/BLUE SHIELD

## 2017-05-05 ENCOUNTER — Emergency Department (HOSPITAL_COMMUNITY)
Admission: EM | Admit: 2017-05-05 | Discharge: 2017-05-05 | Disposition: A | Discharge status: Home / Self Care | Payer: BLUE CROSS/BLUE SHIELD | Attending: Emergency Medicine | Admitting: Emergency Medicine

## 2017-05-05 ENCOUNTER — Other Ambulatory Visit: Payer: Self-pay

## 2017-05-05 DIAGNOSIS — J45909 Unspecified asthma, uncomplicated: Secondary | ICD-10-CM | POA: Insufficient documentation

## 2017-05-05 DIAGNOSIS — R002 Palpitations: Secondary | ICD-10-CM

## 2017-05-05 DIAGNOSIS — R0789 Other chest pain: Secondary | ICD-10-CM | POA: Diagnosis not present

## 2017-05-05 DIAGNOSIS — G901 Familial dysautonomia [Riley-Day]: Secondary | ICD-10-CM | POA: Diagnosis not present

## 2017-05-05 DIAGNOSIS — R0602 Shortness of breath: Secondary | ICD-10-CM | POA: Diagnosis not present

## 2017-05-05 DIAGNOSIS — Z79899 Other long term (current) drug therapy: Secondary | ICD-10-CM | POA: Insufficient documentation

## 2017-05-05 LAB — CBC
HCT: 39.1 % (ref 36.0–46.0)
Hemoglobin: 13 g/dL (ref 12.0–15.0)
MCH: 29.1 pg (ref 26.0–34.0)
MCHC: 33.2 g/dL (ref 30.0–36.0)
MCV: 87.5 fL (ref 78.0–100.0)
Platelets: 309 10*3/uL (ref 150–400)
RBC: 4.47 MIL/uL (ref 3.87–5.11)
RDW: 12.8 % (ref 11.5–15.5)
WBC: 7.4 10*3/uL (ref 4.0–10.5)

## 2017-05-05 LAB — BASIC METABOLIC PANEL
Anion gap: 8 (ref 5–15)
BUN: 7 mg/dL (ref 6–20)
CO2: 24 mmol/L (ref 22–32)
Calcium: 9.1 mg/dL (ref 8.9–10.3)
Chloride: 105 mmol/L (ref 101–111)
Creatinine, Ser: 0.76 mg/dL (ref 0.44–1.00)
GFR calc Af Amer: >60 mL/min (ref >60)
GFR calc non Af Amer: >60 mL/min (ref >60)
Glucose, Bld: 70 mg/dL (ref 65–99)
Potassium: 3.9 mmol/L (ref 3.5–5.1)
Sodium: 137 mmol/L (ref 135–145)

## 2017-05-05 LAB — I-STAT TROPONIN, ED: Troponin i, poc: 0 ng/mL (ref 0.00–0.08)

## 2017-05-05 LAB — D-DIMER, QUANTITATIVE: D-Dimer, Quant: <0.27 ug/mL-FEU (ref 0.00–0.50)

## 2017-05-05 LAB — I-STAT BETA HCG BLOOD, ED (MC, WL, AP ONLY): I-stat hCG, quantitative: <5 m[IU]/mL (ref <5)

## 2017-05-05 LAB — TSH: TSH: 1.475 u[IU]/mL (ref 0.350–4.500)

## 2017-05-05 NOTE — Discharge Instructions (Signed)
Please read attached information regarding her condition. Follow up with cardiologist for further evaluation. Continue home medications as previously prescribed. Return to ED for chest pain, trouble breathing, coughing up blood, leg swelling, head injury, loss of consciousness.

## 2017-05-05 NOTE — ED Provider Notes (Signed)
[facility] DEPT Provider Note   CSN: [id number] Arrival date & time: 05/05/17  1210     History   Chief Complaint Chief Complaint  Patient presents with  . Tachycardia    HPI Faith Jordan is a 21 y.o. female.  HPI  Patient presents to ED for evaluation of palpitations, chest tightness and shortness of breath that have occurred for the past week. She states that she has a history of sinus tachycardia and dysautonomia and is followed by cardiologist for several years. She states however that her propranolol usually keeps this under control. She is unsure why for the past week it has been causing her palpitations. She reports having an appointment with her cardiologist today at 3:00 PM for a react event monitor but she was sent here to the emergency room due to her increasing palpitations and chest tightness. She reports that her heart rate will sometimes get up to 120 to 130 even while she is sitting down with no activity. She denies any hemoptysis, prior MI, prior PE or DVT. She states that she is on birth control pills, as well as medication for her migraines. She denies any recent surgeries but states that she had a lumpectomy and reconstruction surgery done on her right breast 2 years ago due to benign lump that was found. She denies any leg swelling, cough, fever, chills, viral URI symptoms, abdominal pain, nausea, vomiting, urinary or vaginal complaints.  Past Medical History:  Diagnosis Date  . Acne   . Angioedema   . Aquagenic angio-edema-urticaria   . Asthma    no problems /not used recently  . Dysautonomia   . Dysrhythmia    sinus tachycardia  . Headache(784.0)   . Pneumonia    hx  6th grade  . Vasculitis [facility])     Patient Active Problem List   Diagnosis Date Noted  . Angio-edema 04/27/2016  . 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 H/O disease 05/09/2013  . Neurocardiogenic syncope 04/06/2013  .  Intermittent palpitations 04/06/2013    Past Surgical History:  Procedure Laterality Date  . ADENOIDECTOMY    . BREAST LUMPECTOMY Right   . BREAST LUMPECTOMY Right   . MASS EXCISION Right 10/07/2014   Procedure: EXCISION OF RIGHT BREAST MASS;  Surgeon: Jackolyn Confer, MD;  Location: [place];  Service: General;  Laterality: Right;  . TONSILLECTOMY AND ADENOIDECTOMY    . TOOTH EXTRACTION      OB History    No data available       Home Medications    Prior to Admission medications   Medication Sig Start Date End Date Taking? Authorizing Provider  BACLOFEN PO Take 50 mg by mouth as directed.    [provider]  diphenhydrAMINE (BENADRYL) 25 MG tablet Take 50 mg by mouth daily as needed (Angio Edema). Takes with Zantac    [provider]  doxycycline (VIBRA-TABS) 100 MG tablet Take 1 tablet by mouth daily. 04/08/17   [provider]  EPINEPHrine 0.3 mg/0.3 mL IJ SOAJ injection Inject into the muscle once.    [provider]  imipramine (TOFRANIL) 25 MG tablet Take 1 tablet by mouth daily. 04/04/17   [provider]  norgestimate-ethinyl estradiol (ORTHO-CYCLEN,SPRINTEC,PREVIFEM) 0.25-35 MG-MCG tablet Take 1 tablet by mouth daily.    [provider]  promethazine (PHENERGAN) 25 MG tablet Take 25 mg by mouth as directed.    [provider]  propranolol (INDERAL) 20 MG tablet  Take 20 mg by mouth 3 (three) times daily.    [provider]    Family History Family History  Problem Relation Age of Onset  . Cancer Maternal Grandmother   . Cancer Maternal Grandfather   . Asthma Other   . Cancer Other   . Epilepsy Other     Social History Social History  Substance Use Topics  . Smoking status: Never Smoker  . Smokeless tobacco: Never Used  . Alcohol use No     Allergies   Advil [ibuprofen]; Nsaids; and Sulfa antibiotics   Review of Systems Review of Systems  Constitutional: Negative for appetite change,  chills and fever.  HENT: Negative for ear pain, rhinorrhea, sneezing and sore throat.   Eyes: Negative for photophobia and visual disturbance.  Respiratory: Positive for shortness of breath. Negative for cough, chest tightness and wheezing.   Cardiovascular: Positive for chest pain and palpitations.  Gastrointestinal: Negative for abdominal pain, blood in stool, constipation, diarrhea, nausea and vomiting.  Genitourinary: Negative for dysuria, hematuria and urgency.  Musculoskeletal: Negative for myalgias.  Skin: Negative for rash.  Neurological: Negative for dizziness, weakness and light-headedness.     Physical Exam Updated Vital Signs BP 123/74   Pulse 92   Temp 97.9 F (36.6 C) (Oral)   Resp 18   LMP 04/14/2017   SpO2 100%   Physical Exam  Constitutional: She appears well-developed and well-nourished. No distress.  HENT:  Head: Normocephalic and atraumatic.  Nose: Nose normal.  Eyes: Conjunctivae and EOM are normal. Left eye exhibits no discharge. No scleral icterus.  Neck: Normal range of motion. Neck supple.  Cardiovascular: Regular rhythm, normal heart sounds and intact distal pulses.  Tachycardia present.  Exam reveals no gallop and no friction rub.   No murmur heard. Pulmonary/Chest: Effort normal and breath sounds normal. No respiratory distress.  Abdominal: Soft. Bowel sounds are normal. She exhibits no distension. There is no tenderness. There is no guarding.  Musculoskeletal: Normal range of motion. She exhibits no edema.  Neurological: She is alert. She exhibits normal muscle tone. Coordination normal.  Skin: Skin is warm and dry. No rash noted.  Psychiatric: She has a normal mood and affect.  Nursing note and vitals reviewed.    ED Treatments / Results  Labs (all labs ordered are listed, but only abnormal results are displayed) Labs Reviewed  BASIC METABOLIC PANEL  CBC  D-DIMER, QUANTITATIVE (NOT AT [facility])  TSH  I-STAT TROPONIN, ED  I-STAT BETA HCG  BLOOD, ED ([facility], [facility], [facility] ONLY)    EKG  EKG Interpretation  Date/Time:  Thursday May 05 2017 12:14:55 EDT Ventricular Rate:  97 PR Interval:  106 QRS Duration: 80 QT Interval:  338 QTC Calculation: 429 R Axis:   88 Text Interpretation:  Sinus rhythm with short PR Otherwise normal ECG Confirmed by Lajean Saver [phone]) on 05/05/2017 12:27:21 PM       Radiology Dg Chest 2 View  Result Date: 05/05/2017 CLINICAL DATA:  Chest pain and tachycardia EXAM: CHEST  2 VIEW COMPARISON:  03/31/2013 FINDINGS: The heart size and mediastinal contours are within normal limits. Both lungs are clear. The visualized skeletal structures are unremarkable. IMPRESSION: No active cardiopulmonary disease. Electronically Signed   By: Inez Catalina M.D.   On: 05/05/2017 12:37    Procedures Procedures (including critical care time)  Medications Ordered in ED Medications - No data to display   Initial Impression / Assessment and Plan / ED Course  I have reviewed the triage  vital signs and the nursing notes.  Pertinent labs & imaging results that were available during my care of the patient were reviewed by me and considered in my medical decision making (see chart for details).     Patient presents to ED for evaluation of palpitations. She states that she has had history of sinus tachycardia for the past 2 years of her life and has seen a cardiologist. She states she is on propranolol for the past few years as well. She had a cardiac event monitor for 48 hours several years ago which showed sinus tachycardia. She states that in the past the propanolol has symptoms under control but she is unsure why for the past week she continues to have palpitations, intermittent chest tightness and shortness of breath. Denies any previous history of thyroid problems, PE, DVT or MI. On physical exam there is no leg swelling present. She has heart rate in the 90s here in the ED. Blood pressure normal. She does not appear in  respiratory distress. Troponin negative 1. CBC, BMP unremarkable. EKG showed normal sinus rhythm. HCG negative. Chest x-ray negative for acute cardiopulmonary disease. She is afebrile with no history of fever. TSH was checked and was normal because patient has not had this checked in about one year, and she reports strong family history of thyroid problems. D-dimer was obtained to rule out PE due to risk factor of use of birth control. Return as negative as well. Patient has follow-up appointment with cardiologist in about one hour for event monitoring for 30 days. I informed her of her negative workup at this time and reassured her. I advised patient and mother to follow up with cardiologist for further evaluation and any medication adjustments needed. Patient encouraged to increase fluid intake. Patient appears stable for discharge at this time. Strict return precautions given.  Final Clinical Impressions(s) / ED Diagnoses   Final diagnoses:  Palpitations    New Prescriptions New Prescriptions   No medications on file     Delia Heady, Hershal Coria 05/05/17 1834    Lajean Saver, MD 05/06/17 2059

## 2017-05-05 NOTE — ED Triage Notes (Signed)
Pt here for tachycardia x 1 week; pt sts hx of same with SOB at times

## 2017-05-05 NOTE — Telephone Encounter (Signed)
New message    Pt mother is calling stating that pt is getting worse. Her mother states that her hr yesterday was between 120-150 all day yesterday.    Pt c/o BP issue: STAT if pt c/o blurred vision, one-sided weakness or slurred speech  1. What are your last 5 BP readings? 131/95  2. Are you having any other symptoms (ex. Dizziness, headache, blurred vision, passed out)? Dizziness, faint, winded, tight in her chest  3. What is your BP issue? Pt bp was high yesterday per mother.

## 2017-05-05 NOTE — Telephone Encounter (Signed)
S/w pt mother(DPR) she states that pt's HR  yesterday was between 120-150 all day yesterday, BP was running 131/95 (she states that this is high for pt)c/o  Dizziness, faint, winded, tightness her chest and fatigue. She states that pt is taking propranolol 20 mg daily. She states that she would like to have monitor placed earlier than next week, I did book 3pm appt to have monitor placed (VM left for [facility] monitors) informed mother that having monitor place will not relieve symptoms so pt should be evaluated in the ER to have sx treated, verbalizes understanding.

## 2017-05-17 NOTE — Telephone Encounter (Signed)
New Message     Pt c/o Shortness Of Breath: STAT if SOB developed within the last 24 hours or pt is noticeably SOB on the phone  1. Are you currently SOB (can you hear that pt is SOB on the phone)?  no  2. How long have you been experiencing SOB? Since she started the heart monitor   3. Are you SOB when sitting or when up moving around?  Both   4. Are you currently experiencing any other symptoms? Chest pressure   Pt wants you to change her medication before she take the heart monitor off on August 18th

## 2017-05-17 NOTE — Telephone Encounter (Signed)
Spoke with pt, she is currently taking propranolol 20 mg once daily and it is not controlling her palpitations. She also reports her bp has been running high. Will forward to dr berry to review and advise if okay to increase propranolol.

## 2017-05-18 NOTE — Telephone Encounter (Signed)
Increase to 40 mg day

## 2017-05-19 ENCOUNTER — Telehealth: Payer: Self-pay | Admitting: Cardiovascular Disease

## 2017-05-19 MED ORDER — PROPRANOLOL HCL 40 MG PO TABS: 40.0000 mg | ORAL_TABLET | Freq: Every day | ORAL | 11 refills | Status: DC

## 2017-05-19 NOTE — Telephone Encounter (Signed)
Faith Jordan is calling to find out if she is to increase Propranolol 20mg  . Please call

## 2017-05-19 NOTE — Telephone Encounter (Signed)
See other note - Dr. Gwenlyn Found OK'd increase of reported dosing of 20mg  propranolol ONCE DAILY to 40mg  DAILY. I called patient, she confirms that she has been taking this medication once daily, as previously noted (on her medication list it shows up as TID). I called [facility] [address] in [place] to confirm dose form - sent in new rx. Pt aware and voiced thanks.

## 2017-05-23 ENCOUNTER — Encounter: Payer: Self-pay | Admitting: *Deleted

## 2017-05-23 MED ORDER — PROPRANOLOL HCL 40 MG PO TABS
40.0000 mg | ORAL_TABLET | Freq: Every day | ORAL | 11 refills | Status: DC
Start: 2017-05-23 — End: 2017-06-16

## 2017-05-23 NOTE — Telephone Encounter (Signed)
Called [facility] back, they had faxed a note asking for clarification of ER form vs immediate release. This has been filled previously by pharmacy for immediate release only. Spoke w patient, patient has only ever taken immediate release - advised to continue w immediate release form, only rx change is for dosing. Pharmacy tech Hinton Dyer voiced acknowledgment.

## 2017-05-23 NOTE — Telephone Encounter (Signed)
This encounter was created in error - please disregard.

## 2017-06-16 ENCOUNTER — Other Ambulatory Visit: Payer: Self-pay | Admitting: *Deleted

## 2017-06-16 MED ORDER — PROPRANOLOL HCL 40 MG PO TABS: 40.0000 mg | ORAL_TABLET | Freq: Every day | ORAL | 5 refills | Status: DC

## 2017-07-06 ENCOUNTER — Other Ambulatory Visit: Payer: Self-pay | Admitting: Cardiovascular Disease

## 2017-07-06 ENCOUNTER — Other Ambulatory Visit: Payer: Self-pay

## 2017-08-24 ENCOUNTER — Ambulatory Visit: Payer: Self-pay | Admitting: Allergy and Immunology

## 2017-08-26 ENCOUNTER — Encounter (INDEPENDENT_AMBULATORY_CARE_PROVIDER_SITE_OTHER): Payer: Self-pay

## 2017-08-26 ENCOUNTER — Ambulatory Visit (INDEPENDENT_AMBULATORY_CARE_PROVIDER_SITE_OTHER): Payer: BLUE CROSS/BLUE SHIELD | Admitting: Cardiovascular Disease

## 2017-08-26 ENCOUNTER — Encounter: Payer: Self-pay | Admitting: Cardiovascular Disease

## 2017-08-26 DIAGNOSIS — G901 Familial dysautonomia [Riley-Day]: Secondary | ICD-10-CM

## 2017-08-26 NOTE — Assessment & Plan Note (Signed)
Faith Jordan returns today for 6 month follow-up. When I saw her before I increased her propranolol from 20-40 mg a day because of her inappropriate sinus tachycardia. This was demonstrated as well on event monitoring. Since I increased her propranolol she is complaining of nightmares which affect her sleep. On going to change her dosing to 20 mg twice a day and will have her see Erasmo Downer back in the office in 3 months to further evaluate. If she is still having nightmares she may be from an alternative beta blocker and/or a calcium channel blocker.

## 2017-08-26 NOTE — Patient Instructions (Signed)
Medication Instructions: Take propranolol 40 mg--1/2 tab (20 mg) twice daily.  Follow-Up: Your physician recommends that you schedule a follow-up appointment in: 3 months with PharmD to follow Propranolol.  Your physician wants you to follow-up in: 1 year with Dr. Gwenlyn Found. You will receive a reminder letter in the mail two months in advance. If you don't receive a letter, please call our office to schedule the follow-up appointment.  If you need a refill on your cardiac medications before your next appointment, please call your pharmacy.

## 2017-08-26 NOTE — Progress Notes (Signed)
08/26/2017 Faith Jordan   1995/10/27  [id number]  Primary Physician Faith Dolphin, MD Primary Cardiologist: Faith Harp MD Faith Jordan, [place]  HPI:  Faith Jordan is a 21 y.o.  fit appearing single Caucasian female who is transitioning her care from her pediatric cardiologist, Dr. Darcus Jordan at [facility], to myself. She is Accompanied by her mother who is a patient of mine as well. I last saw her in the office  04/15/17. 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 She is controlled on low-dose propranolol. 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 When I saw her 6 months ago I increased her propranolol from 20-40 mg a day extended release. Since that time she's noticed some nightmares which has affected her sleeping pattern. Her heart rate continues to be in the high 90s.      Current Meds  Medication Sig  . BACLOFEN PO Take 50 mg by mouth as directed.  . diphenhydrAMINE (BENADRYL) 25 MG tablet Take 50 mg by mouth daily as needed (Angio Edema). Takes with Zantac  . EPINEPHrine 0.3 mg/0.3 mL IJ SOAJ injection Inject into the muscle once.  Marland Kitchen imipramine (TOFRANIL-PM) 75 MG capsule Take 75 mg at bedtime by mouth.  . norgestimate-ethinyl estradiol (ORTHO-CYCLEN,SPRINTEC,PREVIFEM) 0.25-35 MG-MCG tablet Take 1 tablet by mouth daily.  . promethazine (PHENERGAN) 25 MG tablet Take 25 mg by mouth as directed.  . propranolol (INDERAL) 40 MG tablet Take 20 mg 2 (two) times daily by mouth.      Allergies  Allergen Reactions  . 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 Nsaids Dermatitis    Reaction similar to Stephens-Johnson, blistering peeling rash  . Sulfa Antibiotics Swelling    Facial swelling    Social History    Socioeconomic History  . Marital status: Single    Spouse name: Not on file  . Number of children: Not on file  . Years of education: Not on file  . Highest education level: Not on file  Social Needs  . Financial resource strain: Not on file  . Food insecurity - worry: Not on file  . Food insecurity - inability: Not on file  . Transportation needs - medical: Not on file  . Transportation needs - non-medical: Not on file  Occupational History  . Not on file  Tobacco Use  . Smoking status: Never Smoker  . Smokeless tobacco: Never Used  Substance and Sexual Activity  . Alcohol use: No  . Drug use: No  . Sexual activity: Not on file  Other Topics Concern  . Not on file  Social History Narrative  . Not on file     Review of Systems: General: negative for chills, fever, night sweats or weight changes.  Cardiovascular: negative for chest pain, dyspnea on exertion, edema, orthopnea, palpitations, paroxysmal nocturnal dyspnea or shortness of breath Dermatological: negative for rash Respiratory: negative for cough or wheezing Urologic: negative for hematuria Abdominal: negative for nausea, vomiting, diarrhea, bright red blood per rectum, melena, or hematemesis Neurologic: negative for visual changes, syncope, or dizziness All other systems reviewed and are otherwise negative except as noted above.    Blood pressure 100/64, pulse 96, height 5\' 7"  (1.702 m), weight 143 lb (  64.9 kg).  General appearance: alert and no distress Neck: no adenopathy, no carotid bruit, no JVD, supple, symmetrical, trachea midline and thyroid not enlarged, symmetric, no tenderness/mass/nodules Lungs: clear to auscultation bilaterally Heart: regular rate and rhythm, S1, S2 normal, no murmur, click, rub or gallop Extremities: extremities normal, atraumatic, no cyanosis or edema Pulses: 2+ and symmetric Skin: Skin color, texture, turgor normal. No rashes or lesions Neurologic: Alert and oriented X 3,  normal strength and tone. Normal symmetric reflexes. Normal coordination and gait  EKG not performed today  ASSESSMENT AND PLAN:   Dysautonomia, familial Faith Jordan returns today for 6 month follow-up. When I saw her before I increased her propranolol from 20-40 mg a day because of her inappropriate sinus tachycardia. This was demonstrated as well on event monitoring. Since I increased her propranolol she is complaining of nightmares which affect her sleep. On going to change her dosing to 20 mg twice a day and will have her see Faith Jordan back in the office in 3 months to further evaluate. If she is still having nightmares she may be from an alternative beta blocker and/or a calcium channel blocker.      Faith Harp MD FACP,FACC,FAHA, [facility] 08/26/2017 9:03 AM

## 2017-09-07 ENCOUNTER — Ambulatory Visit: Payer: Self-pay | Admitting: Allergy and Immunology

## 2017-09-12 ENCOUNTER — Telehealth: Payer: Self-pay | Admitting: Allergy and Immunology

## 2017-09-12 NOTE — Telephone Encounter (Signed)
Noted thank you

## 2017-09-12 NOTE — Telephone Encounter (Signed)
Patient was contacted Patient was advised to see her PCP in the interim to get some relief until she is re-evaluated by [facility] She would need to be re-evaluated by [facility] since it was so long since she was seen and new medications may or may not be given Patient expressed understanding, was going to keep appt in Dec with [facility] and will be contacting her PCP

## 2017-09-12 NOTE — Telephone Encounter (Signed)
Patient needs to be seen by pcp or urgent care since she has canceled appt 2 times 08/24/2017 and 09/07/17.

## 2017-09-12 NOTE — Telephone Encounter (Signed)
Patient has called in complaining of angioedema and hives Hands swelling causing pain Patient was last seen by [facility] in 2014/2015 Patient was sent up for New Patient appt to reestablish in Dec Patient's mother wants to know if the medication she was given previously can be called in and given to her to help until she is seen?  Doesn't feel that the patient is really new because she came consistently and has been symptom free for awhile in which she has not needed an appt Can patient be seen sooner, if not a new patient?? Please contact patient to answer any question about the medication or her angioedema.

## 2017-09-26 ENCOUNTER — Ambulatory Visit: Payer: BLUE CROSS/BLUE SHIELD | Admitting: Allergy

## 2017-09-28 ENCOUNTER — Ambulatory Visit (INDEPENDENT_AMBULATORY_CARE_PROVIDER_SITE_OTHER): Payer: BLUE CROSS/BLUE SHIELD | Admitting: Allergy & Immunology

## 2017-09-28 VITALS — BP 126/74 | HR 72 | Resp 16 | Ht 66.0 in | Wt 150.2 lb

## 2017-09-28 DIAGNOSIS — L508 Other urticaria: Secondary | ICD-10-CM

## 2017-09-28 DIAGNOSIS — T783XXD Angioneurotic edema, subsequent encounter: Secondary | ICD-10-CM | POA: Diagnosis not present

## 2017-09-28 DIAGNOSIS — R5383 Other fatigue: Secondary | ICD-10-CM

## 2017-09-28 MED ORDER — OMALIZUMAB 150 MG ~~LOC~~ SOLR
300.0000 mg | SUBCUTANEOUS | Status: DC
  Administered 2017-09-28: 300 mg via SUBCUTANEOUS

## 2017-09-28 NOTE — Patient Instructions (Addendum)
1. Angioedema with urticaria - We will get some labs to rule out serious causes of hives and swelling: thyroid antibodies, thyroid Profile, Allergens Zone 2, ANA, Sed Rate (ESR), C-reactive protein, Chronic Urticaria, CBC with Differential/Platelet, Comprehensive Metabolic Panel (CMET) - In the meantime, we are starting Xolair to help with hives and swelling. - Hopefully this will allow you to get off of cyproheptadine.  - I will spend some quality team with your chart and try to think of some other ideas.  2. Return in about 2 months (around 11/29/2017).    Please inform us of any Emergency Department visits, hospitalizations, or changes in symptoms. Call us before going to the ED for breathing or allergy symptoms since we might be able to fit you in for a sick visit. Feel free to contact us anytime with any questions, problems, or concerns.  It was a pleasure to meet you today! Enjoy the holiday season!  Websites that have reliable patient information: 1. American Academy of Asthma, Allergy, and Immunology: www.aaaai.org 2. Food Allergy Research and Education (FARE): foodallergy.org 3. Mothers of Asthmatics: http://www.asthmacommunitynetwork.org 4. American College of Allergy, Asthma, and Immunology: www.acaai.org

## 2017-09-28 NOTE — Progress Notes (Signed)
NEW PATIENT  Date of Service/Encounter:  09/28/17  Referring provider: Karleen Dolphin, MD   Assessment:   Idiopathic urticaria with angioedema  Fatigue   Plan/Recommendations:   1. Angioedema with urticaria - We will get some labs to rule out serious causes of hives and swelling: thyroid antibodies, thyroid Profile, Allergens Zone 2, ANA, Sed Rate (ESR), C-reactive protein, Chronic Urticaria Panel, CBC with Differential/Platelet, Comprehensive Metabolic Panel - We did not get a serum tryptase, but we will collect one when she comes in for her next Xolair.  - In the meantime, we are starting Xolair to help with hives and swelling. - Hopefully this will allow you to get off of cyproheptadine.  - I will spend some quality team with your chart and try to think of some other ideas.  2. Return in about 2 months (around 11/29/2017).  Subjective:   Faith Jordan is a 21 y.o. female presenting today for evaluation of  Chief Complaint  Patient presents with  . Angioedema    Faith Jordan has a history of the following: Patient Active Problem List   Diagnosis Date Noted  . Angio-edema 04/27/2016  . 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 H/O disease 05/09/2013  . Neurocardiogenic syncope 04/06/2013  . Intermittent palpitations 04/06/2013    History obtained from: chart review and patient.  Faith Jordan was referred by Karleen Dolphin, MD.     Faith Jordan is a 21 y.o. female presenting to re-establish care. She was last seen in our clinic in December 2013 by Dr. Neldon Mc. She has been followed here since she was quite young when she presented with urticaria. 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 She was not controlled on conventional antihistamines, and as of the last note in December 2013, she was on cyproheptadine 79m BID as well as cetirizine 11mBID. She had  previously been treated with Plaquenil which was quite successful for her. However, she developed retinopathy and had to stop. Her urticaria seemed to have a lot to do with her periods, but this improved once she was placed on Depo-Provera to control her periods. [facility] a [facility] rheumatologist; this workup was negative including ANA, RF, anti-TPO, and lupus anti-coagulant. [facility]; this did help.     She has a history of aquagenic urticaria. Now she is having angioedema of her eyes. Shje was given prednisone which did help. Hands started swelling and her back was sweling. She tried RTC benadryl, zantac, and cetirizine. She was then changed to cyproheptadine, but she has had marked weighyt gain.   Review of her chart shows that she did have a period of sinus tachycardia with syncope and the diagnosis of POTS was even discussed. She had a history of abdominal pain with a large workup that was negative. Fibromyalgia was discussed as a diagnosis with the history of rib pain and the negative rheumatologic labs. Inflammatory markers have been normal. She had an alpha gal panel sent in July 2014 that was negative. She has has a multitude of normal CBCs and CMPs. Heliobacter titers were negative as of August 2013. She has had several tryptases; only one was slightly elevated at 13.6 (with a normal range of 1.9-13.5).   She has a remote history of asthma and reports rib  pain when she was younger, which would improve with albuterol. She has no history of environmental allergy symptoms. She tolerates all of the major food allergens without adverse event. However, this has not been an issue for several years. Otherwise, there is no history of other atopic diseases, including drug allergies, or stinging insect allergies. There is no significant infectious history.  Vaccinations are up to date.    Past Medical History: Patient Active Problem List   Diagnosis Date Noted  . Angio-edema 04/27/2016  . 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 H/O disease 05/09/2013  . Neurocardiogenic syncope 04/06/2013  . Intermittent palpitations 04/06/2013     Medication List:  Allergies as of 09/28/2017      Reactions   Advil [ibuprofen] Dermatitis   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, blistering peeling skin   Nsaids Dermatitis   Reaction similar to Stephens-Johnson, blistering peeling rash   Sulfa Antibiotics Swelling   Facial swelling      Medication List        Accurate as of 09/28/17 11:59 PM. Always use your most recent med list.          BACLOFEN PO Take 50 mg by mouth as directed.   baclofen 10 MG tablet Commonly known as:  LIORESAL Take 10 mg by mouth daily.   cyproheptadine 4 MG tablet Commonly known as:  PERIACTIN Take 4 mg by mouth 2 (two) times daily.   diphenhydrAMINE 25 MG tablet Commonly known as:  BENADRYL Take 50 mg by mouth daily as needed (Angio Edema). Takes with Zantac   EPINEPHrine 0.3 mg/0.3 mL Soaj injection Commonly known as:  EPI-PEN Inject into the muscle once.   imipramine 25 MG tablet Commonly known as:  TOFRANIL Take 25 mg by mouth daily.   norgestimate-ethinyl estradiol 0.25-35 MG-MCG tablet Commonly known as:  ORTHO-CYCLEN,SPRINTEC,PREVIFEM Take 1 tablet by mouth daily.   promethazine 25 MG tablet Commonly known as:  PHENERGAN Take 25 mg by mouth as directed.   propranolol 40 MG tablet Commonly known as:  INDERAL Take 20 mg 2 (two) times daily by mouth.   TOFRANIL-PM 75 MG capsule Generic drug:  imipramine Take 75 mg at bedtime by mouth.       Birth History: non-contributory. Born at term without complications.   Developmental History: Faith Jordan has met all milestones on time. She has required no speech therapy, occupational therapy,  or physical therapy.   Past Surgical History: Past Surgical History:  Procedure Laterality Date  . ADENOIDECTOMY    . BREAST LUMPECTOMY Right   . BREAST LUMPECTOMY Right   . MASS EXCISION Right 10/07/2014   Procedure: EXCISION OF RIGHT BREAST MASS;  Surgeon: Jackolyn Confer, MD;  Location: [place];  Service: General;  Laterality: Right;  . TONSILLECTOMY AND ADENOIDECTOMY    . TOOTH EXTRACTION       Family History: Family History  Problem Relation Age of Onset  . Cancer Maternal Grandmother   . Cancer Maternal Grandfather   . Asthma Other   . Cancer Other   . Epilepsy Other      Social History: Faith Jordan lives at home in a house that is 21yr old. There are rugs and hardwoods in the main living areas and carpeting in the bedrooms. There are dogs and cats in the home. There are no dust mite coverings on the bedding. There is no tobacco exposure. 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 She has  been under quite a bit of stress lately with finals and whatnot.     Review of Systems: a 14-point review of systems is pertinent for what is mentioned in HPI.  Otherwise, all other systems were negative. Constitutional: negative other than that listed in the HPI Eyes: negative other than that listed in the HPI Ears, nose, mouth, throat, and face: negative other than that listed in the HPI Respiratory: negative other than that listed in the HPI Cardiovascular: negative other than that listed in the HPI Gastrointestinal: negative other than that listed in the HPI Genitourinary: negative other than that listed in the HPI Integument: negative other than that listed in the HPI Hematologic: negative other than that listed in the HPI Musculoskeletal: negative other than that listed in the HPI Neurological: negative other than that listed in the HPI Allergy/Immunologic: negative other than that listed in the HPI    Objective:   Blood pressure 126/74, pulse 72, resp.  rate 16, height _0  (1.676 m), weight 150 lb 3.2 oz (68.1 kg). Body mass index is 24.24 kg/m.   Physical Exam:  General: Alert, interactive, in no acute distress. Pleasant female.  Eyes: No conjunctival injection bilaterally, no discharge on the right, no discharge on the left and no Horner-Trantas dots present. PERRL bilaterally. EOMI without pain. No photophobia.  Ears: Right TM pearly gray with normal light reflex, Left TM pearly gray with normal light reflex, Right TM intact without perforation and Left TM intact without perforation.  Nose/Throat: External nose within normal limits and septum midline. Turbinates edematous and pale with clear discharge. Posterior oropharynx erythematous without cobblestoning in the posterior oropharynx. Tonsils 2+ without exudates.  Tongue without thrush. Neck: Supple without thyromegaly. Trachea midline. Adenopathy: no enlarged lymph nodes appreciated in the anterior cervical, occipital, axillary, epitrochlear, inguinal, or popliteal regions. Lungs: Clear to auscultation without wheezing, rhonchi or rales. No increased work of breathing. CV: Normal S1/S2. No murmurs. Capillary refill <2 seconds.  Abdomen: Nondistended, nontender. No guarding or rebound tenderness. Bowel sounds present in all fields and hypoactive  Skin: Warm and dry, without lesions or rashes. Extremities:  No clubbing, cyanosis or edema. Neuro:   Grossly intact. No focal deficits appreciated. Responsive to questions.  Diagnostic studies: none     Salvatore Marvel, MD [facility]

## 2017-09-30 ENCOUNTER — Encounter: Payer: Self-pay | Admitting: Allergy & Immunology

## 2017-10-05 ENCOUNTER — Telehealth: Payer: Self-pay

## 2017-10-05 NOTE — Telephone Encounter (Signed)
ERR

## 2017-10-05 NOTE — Progress Notes (Signed)
Referral was placed in to [employer]

## 2017-10-12 ENCOUNTER — Ambulatory Visit: Payer: BLUE CROSS/BLUE SHIELD | Admitting: Allergy & Immunology

## 2017-10-12 ENCOUNTER — Telehealth: Payer: Self-pay

## 2017-10-12 NOTE — Telephone Encounter (Signed)
Pt came in for clarification with her Mother of lab results. Advised of referral to Endocrinology.

## 2017-10-14 LAB — IGE+ALLERGENS ZONE 2(30)

## 2017-10-14 LAB — COMPREHENSIVE METABOLIC PANEL
ALT: 8 IU/L (ref 0–32)
AST: 14 IU/L (ref 0–40)
Albumin/Globulin Ratio: 1.6 (ref 1.2–2.2)
Albumin: 4 g/dL (ref 3.5–5.5)
Alkaline Phosphatase: 70 IU/L (ref 39–117)
BUN/Creatinine Ratio: 4 — ABNORMAL LOW (ref 9–23)
BUN: 3 mg/dL — ABNORMAL LOW (ref 6–20)
Bilirubin Total: 0.2 mg/dL (ref 0.0–1.2)
CO2: 25 mmol/L (ref 20–29)
Calcium: 9.2 mg/dL (ref 8.7–10.2)
Chloride: 105 mmol/L (ref 96–106)
Creatinine, Ser: 0.68 mg/dL (ref 0.57–1.00)
GFR calc Af Amer: 145 mL/min/{1.73_m2} (ref >59)
GFR calc non Af Amer: 125 mL/min/{1.73_m2} (ref >59)
Globulin, Total: 2.5 g/dL (ref 1.5–4.5)
Glucose: 86 mg/dL (ref 65–99)
Potassium: 4.8 mmol/L (ref 3.5–5.2)
Sodium: 142 mmol/L (ref 134–144)
Total Protein: 6.5 g/dL (ref 6.0–8.5)

## 2017-10-14 LAB — CBC WITH DIFFERENTIAL/PLATELET
Basophils Absolute: 0 10*3/uL (ref 0.0–0.2)
Basos: 0 %
EOS (ABSOLUTE): 0 10*3/uL (ref 0.0–0.4)
Eos: 0 %
Hematocrit: 36.7 % (ref 34.0–46.6)
Hemoglobin: 11.8 g/dL (ref 11.1–15.9)
Immature Grans (Abs): 0 10*3/uL (ref 0.0–0.1)
Immature Granulocytes: 0 %
Lymphocytes Absolute: 2.8 10*3/uL (ref 0.7–3.1)
Lymphs: 40 %
MCH: 28.2 pg (ref 26.6–33.0)
MCHC: 32.2 g/dL (ref 31.5–35.7)
MCV: 88 fL (ref 79–97)
Monocytes Absolute: 0.6 10*3/uL (ref 0.1–0.9)
Monocytes: 9 %
Neutrophils Absolute: 3.7 10*3/uL (ref 1.4–7.0)
Neutrophils: 51 %
Platelets: 368 10*3/uL (ref 150–379)
RBC: 4.18 x10E6/uL (ref 3.77–5.28)
RDW: 13.1 % (ref 12.3–15.4)
WBC: 7.1 10*3/uL (ref 3.4–10.8)

## 2017-10-14 LAB — THYROID PANEL
Free Thyroxine Index: 1.6 (ref 1.2–4.9)
T3 Uptake Ratio: 21 % — ABNORMAL LOW (ref 24–39)
T4, Total: 7.6 ug/dL (ref 4.5–12.0)

## 2017-10-14 LAB — SEDIMENTATION RATE: Sed Rate: 5 mm/hr (ref 0–32)

## 2017-10-14 LAB — THYROID ANTIBODIES
Thyroglobulin Antibody: 1.3 IU/mL — ABNORMAL HIGH (ref 0.0–0.9)
Thyroperoxidase Ab SerPl-aCnc: <6 IU/mL (ref 0–34)

## 2017-10-14 LAB — CHRONIC URTICARIA: cu index: 17.2 — ABNORMAL HIGH (ref <10)

## 2017-10-14 LAB — C-REACTIVE PROTEIN: CRP: 3.1 mg/L (ref 0.0–4.9)

## 2017-10-14 LAB — ANA: Anti Nuclear Antibody(ANA): NEGATIVE

## 2017-10-26 ENCOUNTER — Ambulatory Visit: Payer: Self-pay

## 2017-11-02 ENCOUNTER — Encounter: Payer: Self-pay | Admitting: Endocrinology

## 2017-11-02 ENCOUNTER — Ambulatory Visit (INDEPENDENT_AMBULATORY_CARE_PROVIDER_SITE_OTHER): Payer: BLUE CROSS/BLUE SHIELD | Admitting: Endocrinology

## 2017-11-02 ENCOUNTER — Ambulatory Visit (INDEPENDENT_AMBULATORY_CARE_PROVIDER_SITE_OTHER): Payer: BLUE CROSS/BLUE SHIELD

## 2017-11-02 VITALS — BP 102/68 | HR 89 | Ht 66.0 in | Wt 149.0 lb

## 2017-11-02 DIAGNOSIS — L500 Allergic urticaria: Secondary | ICD-10-CM | POA: Diagnosis not present

## 2017-11-02 DIAGNOSIS — R5383 Other fatigue: Secondary | ICD-10-CM | POA: Diagnosis not present

## 2017-11-02 DIAGNOSIS — R635 Abnormal weight gain: Secondary | ICD-10-CM

## 2017-11-02 LAB — TSH: TSH: 0.77 u[IU]/mL (ref 0.35–4.50)

## 2017-11-02 LAB — T4, FREE: Free T4: 0.84 ng/dL (ref 0.60–1.60)

## 2017-11-02 MED ORDER — OMALIZUMAB 150 MG ~~LOC~~ SOLR
300.0000 mg | SUBCUTANEOUS | Status: DC
  Administered 2017-11-02 – 2019-04-04 (×16): 300 mg via SUBCUTANEOUS

## 2017-11-02 NOTE — Progress Notes (Signed)
Please call to let patient know that the thyroid lab results are normal and no further action or follow-up needed.  She needs to consider that weight gain may be related to taking cyproheptadine and imipramine

## 2017-11-02 NOTE — Progress Notes (Signed)
Patient ID: Faith Jordan, female   DOB: 08-Dec-1995, 22 y.o.   MRN: [id number]           Chief complaint: Tiredness  History of Present Illness:  She has been referred by Dr. Salvatore Marvel, her allergist for evaluation of thyroid  Patient has a strong family history of hypothyroidism and out of the thyroid diseases For the last 4-5 months she has had increasing fatigue although she has had some tiredness before She tends to sleep longer and still does not feel refreshed She may tend to feel more sleepy However he is able to concentrate and do her studies  She does not complain of any cold intolerance, constipation She has gained about 14 pounds over the last few months No history of hair loss  WEIGHT gain: This has been gradual and progressive She does not complain of any muscle weakness or easy bruising She does have light menstrual cycles No history of facial hair  She does take medications for various problems including imipramine and cyproheptadine  Lab Results  Component Value Date   TSH 1.475 05/05/2017   TSH 1.214 03/05/2015      Past Medical History:  Diagnosis Date  . Acne   . Angioedema   . Aquagenic angio-edema-urticaria   . Asthma    no problems /not used recently  . Dysautonomia ([place])   . Dysrhythmia    sinus tachycardia  . Headache(784.0)   . Pneumonia    hx  6th grade  . Vasculitis [facility])     Past Surgical History:  Procedure Laterality Date  . ADENOIDECTOMY    . BREAST LUMPECTOMY Right   . BREAST LUMPECTOMY Right   . MASS EXCISION Right 10/07/2014   Procedure: EXCISION OF RIGHT BREAST MASS;  Surgeon: Jackolyn Confer, MD;  Location: [place];  Service: General;  Laterality: Right;  . TONSILLECTOMY AND ADENOIDECTOMY    . TOOTH EXTRACTION      Family History  Problem Relation Age of Onset  . Cancer Maternal Grandmother   . Cancer Maternal Grandfather   . Asthma Other   . Cancer Other   . Epilepsy Other     Social History:  reports  that  has never smoked. she has never used smokeless tobacco. She reports that she does not drink alcohol or use drugs.  Allergies:  Allergies  Allergen Reactions  . 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 Nsaids Dermatitis    Reaction similar to Stephens-Johnson, blistering peeling rash  . Sulfa Antibiotics Swelling    Facial swelling    Allergies as of 11/02/2017      Reactions   Advil [ibuprofen] Dermatitis   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, blistering peeling skin   Nsaids Dermatitis   Reaction similar to Stephens-Johnson, blistering peeling rash   Sulfa Antibiotics Swelling   Facial swelling      Medication List        Accurate as of 11/02/17  4:15 PM. Always use your most recent med list.          BACLOFEN PO Take 50 mg by mouth as directed.   baclofen 10 MG tablet Commonly known as:  LIORESAL Take 10 mg by mouth daily.   cyproheptadine 4 MG tablet Commonly known as:  PERIACTIN Take 4 mg by mouth 2 (two) times daily.   diphenhydrAMINE 25 MG tablet Commonly known as:  BENADRYL Take 50 mg by mouth daily as needed (Angio Edema). Takes with  Zantac   EPINEPHrine 0.3 mg/0.3 mL Soaj injection Commonly known as:  EPI-PEN Inject into the muscle once.   imipramine 25 MG tablet Commonly known as:  TOFRANIL Take 25 mg by mouth daily.   norgestimate-ethinyl estradiol 0.25-35 MG-MCG tablet Commonly known as:  ORTHO-CYCLEN,SPRINTEC,PREVIFEM Take 1 tablet by mouth daily.   promethazine 25 MG tablet Commonly known as:  PHENERGAN Take 25 mg by mouth as directed.   propranolol 40 MG tablet Commonly known as:  INDERAL Take 20 mg 2 (two) times daily by mouth.   TOFRANIL-PM 75 MG capsule Generic drug:  imipramine Take 75 mg at bedtime by mouth.   XOLAIR 150 MG injection Generic drug:  omalizumab every 30 (thirty) days.            Review of Systems  Constitutional:       Has gained about 14  pounds over the last few months  HENT: Positive for headaches.        Has occasional migraines  Respiratory: Positive for daytime sleepiness.   Cardiovascular:       History of palpitations and tachycardia, now well controlled on Inderal and followed by cardiologist  Gastrointestinal: Negative for constipation and abdominal pain.  Endocrine: Positive for menstrual changes and fatigue.       Menstrual cycles for the last few months have been lighter, sometimes just for 1 day despite continuing her birth control pill Has only minimal lightheadedness if getting up quickly from sitting or lying down  Musculoskeletal: Negative for joint pain.  Skin: Positive for dry skin. Negative for rash.  Allergic/Immunologic: Positive for hives.       She has had history of angioedema and hives controlled with Xolair  Neurological:       May have occasional transient numbness in her toes      PHYSICAL EXAM:  BP 102/68   Pulse 89   Ht 5\' 6"  (1.676 m)   Wt 149 lb (67.6 kg)   SpO2 99%   BMI 24.05 kg/m   GENERAL:  Well-built and nourished Cushingoid features of central obesity absent  No pallor, clubbing, lymphadenopathy or edema.    Skin:  no rash or pigmentation.  No hirsutism or alopecia  EYES:  Externally normal.  Fundii:  normal discs and vessels.  ENT: Oral mucosa and tongue normal.  THYROID:  Not palpable.  HEART:  Normal  S1 and S2; no murmur or click.  CHEST:  Normal shape.  Lungs: Vescicular breath sounds heard equally.  No crepitations/ wheeze.  ABDOMEN:  No distention.  Liver and spleen not palpable.  No other mass or tenderness.  NEUROLOGICAL: .Reflexes are bilaterally normal at biceps .  JOINTS:  Normal peripheral joints.   ASSESSMENT:    She does not have any thyroid disease.  Her low T3 uptake is because of her being on a birth control pill and increase thyroxine binding globulin.  No minimal increase in  thyroglobulin antibodies is occasionally seen in family  members of patients with our disease which she is prevalent in her family.  She has a negative TPO antibody which is more significant for Hashimoto's thyroiditis.  No evidence of thyroid enlargement on exam today  Fatigue and somnolence may be related to medications that she is on  Weight gain: No evidence of adrenal hormone excess on exam or any suggestive history.  Most likely this is related to her taking cyproheptadine and imipramine   She does have several other medical problems addressed by other physicians  PLAN:    Recheck TSH as it has been 6 months since this has been done and she is having more fatigue recently.  If this is normal no further endocrine evaluation will be needed   Consultation note sent to the referring physician  Elayne Snare 11/02/2017, 4:15 PM   ADDENDUM: Free T4 and TSH are normal, no further evaluation needed

## 2017-11-23 ENCOUNTER — Encounter: Payer: Self-pay | Admitting: Allergy & Immunology

## 2017-11-23 ENCOUNTER — Ambulatory Visit (INDEPENDENT_AMBULATORY_CARE_PROVIDER_SITE_OTHER): Payer: BLUE CROSS/BLUE SHIELD | Admitting: Allergy & Immunology

## 2017-11-23 VITALS — BP 102/60 | HR 82 | Resp 16 | Ht 66.0 in | Wt 149.0 lb

## 2017-11-23 DIAGNOSIS — L500 Allergic urticaria: Secondary | ICD-10-CM | POA: Diagnosis not present

## 2017-11-23 NOTE — Patient Instructions (Addendum)
1. Allergic urticaria - Continue with Xolair at the current dosing. - There is room to increase the frequency if needed. - We will get thyroid studies every six months given the positive anti-thyroglobulin antibodies. - Antihistamines as needed. - She will follow up annually to renew Xolair script.   2. Return in about 1 year (around 11/23/2018).     Please inform us of any Emergency Department visits, hospitalizations, or changes in symptoms. Call us before going to the ED for breathing or allergy symptoms since we might be able to fit you in for a sick visit. Feel free to contact us anytime with any questions, problems, or concerns.  It was a pleasure to see you again today! Happy New Year!   Websites that have reliable patient information: 1. American Academy of Asthma, Allergy, and Immunology: www.aaaai.org 2. Food Allergy Research and Education (FARE): foodallergy.org 3. Mothers of Asthmatics: http://www.asthmacommunitynetwork.org 4. American College of Allergy, Asthma, and Immunology: www.acaai.org

## 2017-11-23 NOTE — Progress Notes (Signed)
FOLLOW UP  Date of Service/Encounter:  11/23/17   Assessment:   Allergic idiopathic urticaria  Plan/Recommendations:   1. Allergic urticaria - Continue with Xolair at the current dosing. - There is room to increase the frequency if needed. - We will get thyroid studies every six months given the positive anti-thyroglobulin antibodies. - Antihistamines as needed. - She will follow up annually to renew Xolair script.   2. Return in about 1 year (around 11/23/2018).    Subjective:   Faith Jordan Jordan is Faith Jordan 22 y.o. female presenting today for follow up of  Chief Complaint  Patient presents with  . Urticaria    no issues since last seen. doing great.  . Angioedema    Faith Jordan Jordan has Faith Jordan history of the following: Patient Active Problem List   Diagnosis Date Noted  . Angio-edema 04/27/2016  . 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 H/O disease 05/09/2013  . Neurocardiogenic syncope 04/06/2013  . Intermittent palpitations 04/06/2013    History obtained from: chart review and patient.  Faith Jordan Jordan Primary Care Provider is Karleen Dolphin, MD.     Faith Jordan Jordan is Faith Jordan 22 y.o. female presenting for Faith Jordan follow up visit. She was last seen in December 2018 as Faith Jordan new patient for an evaluation of urticaria. At that time, she was having some fatigue as well, therefore we obtained some labs including thyroid studies, thyroid antibodies, chronic urticaria panel, and inflammatory markers. We also obtained Faith Jordan CBC and Faith Jordan CMP, both of which were normal. Labs were only notable for elevated thyroglobulin antibodies. We initiated Xolair at the last visit since she was so miserable. We referred to Endocrinology because of the fatigue and the thyroid antibodies.  Since the last visit, she has done very well. She is receiving her Xolair monthly and tolerating this without Faith Jordan problem. In fact, she is on absolutely no antihistamines with the use of the  Xolair. 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 She did go to see Dr. Dwyane Dee in Endocrinology, who did not feel that Faith Jordan further workup was warranted. She is seeing him PRN at this point. He felt that the lack of TPO antibodies made Hashimoto's unlikely. He felt that the low T3 uptake was secondary to her being on an OCP and the subsequent increase in thyroxine binding globulin.   Otherwise, there have been no changes to her past medical history, surgical history, family history, or social history.    Review of Systems: Faith Jordan 14-point review of systems is pertinent for what is mentioned in HPI.  Otherwise, all other systems were negative. Constitutional: negative other than that listed in the HPI Eyes: negative other than that listed in the HPI Ears, nose, mouth, throat, and face: negative other than that listed in the HPI Respiratory: negative other than that listed in the HPI Cardiovascular: negative other than that listed in the HPI Gastrointestinal: negative other than that listed in the HPI Genitourinary: negative other than that listed in the HPI Integument: negative other than that listed in the HPI Hematologic: negative other than that listed in the HPI Musculoskeletal: negative other than that listed in the HPI Neurological: negative other than that listed in the HPI Allergy/Immunologic: negative other than that listed in the HPI    Objective:   Blood pressure 102/60, pulse 82, resp. rate 16, height 5\' 6"  (1.676 m), weight 149 lb (67.6 kg), SpO2  99 %. Body mass index is 24.05 kg/m.   Physical Exam:  General: Alert, interactive, in no acute distress. Pleasant and very thankful female.  Eyes: No conjunctival injection bilaterally, no discharge on the right, no discharge on the left and no Horner-Trantas dots present. PERRL bilaterally. EOMI without pain. No photophobia.  Ears: Right TM pearly gray with normal light reflex, Left TM pearly  gray with normal light reflex, Right TM intact without perforation and Left TM intact without perforation.  Nose/Throat: External nose within normal limits, nasal crease present and septum midline. Turbinates edematous and pale with clear discharge. Posterior oropharynx erythematous without cobblestoning in the posterior oropharynx. Tonsils 2+ without exudates.  Tongue without thrush. Adenopathy: no enlarged lymph nodes appreciated in the anterior cervical, occipital, axillary, epitrochlear, inguinal, or popliteal regions. Lungs: Clear to auscultation without wheezing, rhonchi or rales. No increased work of breathing. CV: Normal S1/S2. No murmurs. Capillary refill <2 seconds.  Skin: Warm and dry, without lesions or rashes. Neuro:   Grossly intact. No focal deficits appreciated. Responsive to questions.  Diagnostic studies: none       Salvatore Marvel, MD [facility]

## 2017-11-30 ENCOUNTER — Ambulatory Visit (INDEPENDENT_AMBULATORY_CARE_PROVIDER_SITE_OTHER): Payer: BLUE CROSS/BLUE SHIELD | Admitting: *Deleted

## 2017-11-30 DIAGNOSIS — L500 Allergic urticaria: Secondary | ICD-10-CM | POA: Diagnosis not present

## 2017-12-01 ENCOUNTER — Encounter (HOSPITAL_COMMUNITY): Payer: Self-pay | Admitting: Emergency Medicine

## 2017-12-01 ENCOUNTER — Telehealth: Payer: Self-pay | Admitting: Cardiovascular Disease

## 2017-12-01 ENCOUNTER — Emergency Department (HOSPITAL_COMMUNITY)
Admission: EM | Admit: 2017-12-01 | Discharge: 2017-12-01 | Disposition: A | Discharge status: Home / Self Care | Payer: BLUE CROSS/BLUE SHIELD | Attending: Emergency Medicine | Admitting: Emergency Medicine

## 2017-12-01 DIAGNOSIS — G901 Familial dysautonomia [Riley-Day]: Secondary | ICD-10-CM | POA: Diagnosis not present

## 2017-12-01 DIAGNOSIS — J45909 Unspecified asthma, uncomplicated: Secondary | ICD-10-CM | POA: Insufficient documentation

## 2017-12-01 DIAGNOSIS — Z79899 Other long term (current) drug therapy: Secondary | ICD-10-CM | POA: Diagnosis not present

## 2017-12-01 DIAGNOSIS — R55 Syncope and collapse: Secondary | ICD-10-CM

## 2017-12-01 LAB — BASIC METABOLIC PANEL
Anion gap: 10 (ref 5–15)
BUN: 9 mg/dL (ref 6–20)
CO2: 21 mmol/L — ABNORMAL LOW (ref 22–32)
Calcium: 8.8 mg/dL — ABNORMAL LOW (ref 8.9–10.3)
Chloride: 105 mmol/L (ref 101–111)
Creatinine, Ser: 0.77 mg/dL (ref 0.44–1.00)
GFR calc Af Amer: >60 mL/min (ref >60)
GFR calc non Af Amer: >60 mL/min (ref >60)
Glucose, Bld: 110 mg/dL — ABNORMAL HIGH (ref 65–99)
Potassium: 4.1 mmol/L (ref 3.5–5.1)
Sodium: 136 mmol/L (ref 135–145)

## 2017-12-01 LAB — I-STAT TROPONIN, ED: Troponin i, poc: 0 ng/mL (ref 0.00–0.08)

## 2017-12-01 LAB — CBC
HCT: 32.4 % — ABNORMAL LOW (ref 36.0–46.0)
Hemoglobin: 10.3 g/dL — ABNORMAL LOW (ref 12.0–15.0)
MCH: 25.8 pg — ABNORMAL LOW (ref 26.0–34.0)
MCHC: 31.8 g/dL (ref 30.0–36.0)
MCV: 81 fL (ref 78.0–100.0)
Platelets: 344 10*3/uL (ref 150–400)
RBC: 4 MIL/uL (ref 3.87–5.11)
RDW: 13.3 % (ref 11.5–15.5)
WBC: 8.4 10*3/uL (ref 4.0–10.5)

## 2017-12-01 LAB — I-STAT BETA HCG BLOOD, ED (MC, WL, AP ONLY): I-stat hCG, quantitative: <5 m[IU]/mL (ref <5)

## 2017-12-01 LAB — CBG MONITORING, ED: Glucose-Capillary: 94 mg/dL (ref 65–99)

## 2017-12-01 MED ORDER — SODIUM CHLORIDE 0.9 % IV BOLUS (SEPSIS)
1000.0000 mL | Freq: Once | INTRAVENOUS | Status: AC
  Administered 2017-12-01: 1000 mL via INTRAVENOUS

## 2017-12-01 NOTE — ED Triage Notes (Signed)
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 Pt had syncopal event while in chair was diaphoretic.Arrives in ED pale and still lethargic. Pt is alert but drowsy only shaking her head yes and no. Pt has a cardiac history and followed by MD Gwenlyn Found recently wore a heart monitor.

## 2017-12-01 NOTE — ED Notes (Signed)
Walked patient to bathroom states she feels ok walking steady gait. Mother at bedside.

## 2017-12-01 NOTE — Discharge Instructions (Signed)
As we discussed, I would go back to your 25 mg dose of propranolol tonight.  I would advise you to call cardiology as well to discuss possible medication changes.  Some medications, like metoprolol, may drop your blood pressure less than the propranolol.  Drink plenty of fluids.

## 2017-12-01 NOTE — ED Notes (Signed)
Attempted to walk to bathroom states her legs felt weak , went in wheelchair.

## 2017-12-01 NOTE — ED Notes (Signed)
Pt asked if she felt steady enough to perform orthostatic VS and she said not yet--said to try again in 30 minutes.

## 2017-12-01 NOTE — Telephone Encounter (Signed)
New message  Patient seen in ED today;  Dysautonomia ([place])  Syncope, unspecified syncope type  Pt c/o Syncope: STAT if syncope occurred within 30 minutes and pt complains of lightheadedness High Priority if episode of passing out, completely, today or in last 24 hours   1. Did you pass out today? yes  2. When is the last time you passed out? Today 12/01/17  3. Has this occurred multiple times? No  4. Did you have any symptoms prior to passing out? Lightheaded, dizziness, heart racing

## 2017-12-01 NOTE — ED Provider Notes (Signed)
[facility] EMERGENCY DEPARTMENT Provider Note   CSN: [id number] Arrival date & time: 12/01/17  2229     History   Chief Complaint Chief Complaint  Patient presents with  . Loss of Consciousness    HPI ALETTE Jordan is a 22 y.o. female.  HPI   22 year-old female with history of dysautonomia here with syncopal episode.  Patient reportedly just increased her beta-blocker.  She was shadowing to watch a cardiac surgery today when she began to feel acutely lightheaded.  She began to feel weak and numb "all over."  She tried to sit down but then syncopized.  She was noted to go pale.  She regained consciousness upon lying on the floor.  There was no significant head trauma.  Patient states she feels generally very weak right now.  Denies any other complaints.  Denies any focal numbness or weakness.  She had no chest pain or palpitations with this episode.  She did take her medication last night, which is an increased dose as mentioned.  No lower extremity swelling.  No history of DVT or PE.  Past Medical History:  Diagnosis Date  . Acne   . Angioedema   . Aquagenic angio-edema-urticaria   . Asthma    no problems /not used recently  . Dysautonomia ([place])   . Dysrhythmia    sinus tachycardia  . Headache(784.0)   . Pneumonia    hx  6th grade  . Vasculitis [facility])     Patient Active Problem List   Diagnosis Date Noted  . Angio-edema 04/27/2016  . 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 H/O disease 05/09/2013  . Neurocardiogenic syncope 04/06/2013  . Intermittent palpitations 04/06/2013    Past Surgical History:  Procedure Laterality Date  . ADENOIDECTOMY    . BREAST LUMPECTOMY Right   . BREAST LUMPECTOMY Right   . MASS EXCISION Right 10/07/2014   Procedure: EXCISION OF RIGHT BREAST MASS;  Surgeon: Jackolyn Confer, MD;  Location: [facility];  Service: General;  Laterality: Right;  . TONSILLECTOMY AND  ADENOIDECTOMY    . TOOTH EXTRACTION      OB History    No data available       Home Medications    Prior to Admission medications   Medication Sig Start Date End Date Taking? Authorizing Provider  baclofen (LIORESAL) 10 MG tablet Take 10 mg by mouth daily. 09/11/17   [provider]  cyproheptadine (PERIACTIN) 4 MG tablet Take 4 mg by mouth 2 (two) times daily. 09/13/17   [provider]  diphenhydrAMINE (BENADRYL) 25 MG tablet Take 50 mg by mouth daily as needed (Angio Edema). Takes with Zantac    [provider]  EPINEPHrine 0.3 mg/0.3 mL IJ SOAJ injection Inject into the muscle once.    [provider]  imipramine (TOFRANIL) 25 MG tablet Take 50 mg by mouth at bedtime.  09/24/17   [provider]  norgestimate-ethinyl estradiol (ORTHO-CYCLEN,SPRINTEC,PREVIFEM) 0.25-35 MG-MCG tablet Take 1 tablet by mouth daily.    [provider]  promethazine (PHENERGAN) 25 MG tablet Take 25 mg by mouth as directed.    [provider]  propranolol (INDERAL) 40 MG tablet Take 20 mg 2 (two) times daily by mouth.     [provider]  XOLAIR 150 MG injection every 30 (thirty) days.  10/17/17   [provider]    Family History Family History  Problem Relation Age of Onset  .  Cancer Maternal Grandmother   . Cancer Maternal Grandfather   . Asthma Other   . Cancer Other   . Epilepsy Other     Social History Social History   Tobacco Use  . Smoking status: Never Smoker  . Smokeless tobacco: Never Used  Substance Use Topics  . Alcohol use: No  . Drug use: No     Allergies   Advil [ibuprofen]; Nsaids; and Sulfa antibiotics   Review of Systems Review of Systems  Constitutional: Positive for fatigue. Negative for chills and fever.  HENT: Negative for congestion and rhinorrhea.   Eyes: Negative for visual disturbance.  Respiratory: Negative for cough, shortness of breath and wheezing.   Cardiovascular:  Negative for chest pain and leg swelling.  Gastrointestinal: Negative for abdominal pain, diarrhea, nausea and vomiting.  Genitourinary: Negative for dysuria and flank pain.  Musculoskeletal: Negative for neck pain and neck stiffness.  Skin: Negative for rash and wound.  Allergic/Immunologic: Negative for immunocompromised state.  Neurological: Positive for syncope, weakness and light-headedness. Negative for headaches.  All other systems reviewed and are negative.    Physical Exam Updated Vital Signs BP 118/61 (BP Location: Right Arm)   Pulse 100   Temp 98.2 F (36.8 C) (Oral)   Resp 15   LMP 11/24/2017   SpO2 100%   Physical Exam  Constitutional: She is oriented to person, place, and time. She appears well-developed and well-nourished. No distress.  HENT:  Head: Normocephalic and atraumatic.  Mildly dry mucous membranes  Eyes: Conjunctivae are normal.  Neck: Neck supple.  Cardiovascular: Normal rate, regular rhythm and normal heart sounds. Exam reveals no friction rub.  No murmur heard. Pulmonary/Chest: Effort normal and breath sounds normal. No respiratory distress. She has no wheezes. She has no rales.  Abdominal: She exhibits no distension.  Musculoskeletal: She exhibits no edema.  Neurological: She is alert and oriented to person, place, and time. She exhibits normal muscle tone.  Skin: Skin is warm. Capillary refill takes less than 2 seconds.  Psychiatric: She has a normal mood and affect.  Nursing note and vitals reviewed.   ED Treatments / Results  Labs (all labs ordered are listed, but only abnormal results are displayed) Labs Reviewed  CBC - Abnormal; Notable for the following components:      Result Value   Hemoglobin 10.3 (*)    HCT 32.4 (*)    MCH 25.8 (*)    All other components within normal limits  BASIC METABOLIC PANEL - Abnormal; Notable for the following components:   CO2 21 (*)    Glucose, Bld 110 (*)    Calcium 8.8 (*)    All other components  within normal limits  I-STAT BETA HCG BLOOD, ED ([facility], [facility], [facility] ONLY)  I-STAT TROPONIN, ED  CBG MONITORING, ED    EKG  EKG Interpretation  Date/Time:  Thursday December 01 2017 09:54:20 EST Ventricular Rate:  88 PR Interval:    QRS Duration: 66 QT Interval:  370 QTC Calculation: 448 R Axis:   79 Text Interpretation:  Sinus rhythm RSR' in V1 or V2, probably normal variant Baseline wander in lead(s) V6 No significant change since last tracing Confirmed by Duffy Bruce [phone]) on 12/01/2017 10:08:18 AM       Radiology No results found.  Procedures Procedures (including critical care time)  Medications Ordered in ED Medications  sodium chloride 0.9 % bolus 1,000 mL (0 mLs Intravenous Stopped 12/01/17 1346)  sodium chloride 0.9 % bolus 1,000 mL (0 mLs  Intravenous Stopped 12/01/17 1434)     Initial Impression / Assessment and Plan / ED Course  I have reviewed the triage vital signs and the nursing notes.  Pertinent labs & imaging results that were available during my care of the patient were reviewed by me and considered in my medical decision making (see chart for details).     22 year old female with past medical history of dysautonomia and recurrent syncope here with syncopal episode.  This occurs in the setting of increasing her propanolol dose and not eating and drinking very much while watching his surgery today.  I suspect this is secondary to dehydration with increased propranolol dose and subsequent orthostasis.  She is markedly orthostatic here.  Lab work is reassuring.  She has some mild anemia and I have advised her to take over-the-counter iron supplements but no signs of significant blood loss.  She feels markedly improved after fluids and monitoring in the ED.  Given that this is a recurring issue with no arrhythmia or ectopy on EKG and well-established diagnosis of recurrent syncope due to dysautonomia, will advise her to cut back her propanolol dose, call her  cardiologist, and encourage fluids.  Final Clinical Impressions(s) / ED Diagnoses   Final diagnoses:  Dysautonomia ([place])  Syncope, unspecified syncope type    ED Discharge Orders    None       Duffy Bruce, MD 12/01/17 [phone]

## 2017-12-01 NOTE — Telephone Encounter (Signed)
Spoke with pt, the MD she saw in the ER today wanted her to contact dr berry about changing from propranolol to metoprolol because it would help control her heart rate without dropping her bp so low. She would like to know if dr berry agrees with the switch. Will forward to dr berry to review and advise.

## 2017-12-01 NOTE — ED Notes (Signed)
ED Provider at bedside. 

## 2017-12-01 NOTE — ED Notes (Addendum)
Pt ambulated to bathroom with RN. Complained of mild nausea. Gait was slow but steady.  Given coca-cola with saltines.

## 2017-12-06 NOTE — Telephone Encounter (Signed)
Follow Up:   Pt said she talked to somebody on 12-01-17,said she was waiting to hear something.

## 2017-12-07 ENCOUNTER — Ambulatory Visit (INDEPENDENT_AMBULATORY_CARE_PROVIDER_SITE_OTHER): Payer: BLUE CROSS/BLUE SHIELD | Admitting: Adult Health

## 2017-12-07 ENCOUNTER — Encounter: Payer: Self-pay | Admitting: Adult Health

## 2017-12-07 VITALS — BP 104/60 | HR 78 | Ht 67.0 in | Wt 148.0 lb

## 2017-12-07 DIAGNOSIS — G901 Familial dysautonomia [Riley-Day]: Secondary | ICD-10-CM | POA: Diagnosis not present

## 2017-12-07 DIAGNOSIS — R002 Palpitations: Secondary | ICD-10-CM | POA: Diagnosis not present

## 2017-12-07 MED ORDER — PROPRANOLOL HCL 20 MG PO TABS: 20.0000 mg | ORAL_TABLET | Freq: Every day | ORAL | 3 refills | Status: DC

## 2017-12-07 MED ORDER — MIDODRINE HCL 2.5 MG PO TABS: 2.5000 mg | ORAL_TABLET | Freq: Three times a day (TID) | ORAL | 3 refills | Status: DC

## 2017-12-07 NOTE — Progress Notes (Signed)
Cardiology Office Note   Date:  12/07/2017   ID:  Faith Jordan, DOB 11/02/1995, MRN [id number]  PCP:  Karleen Dolphin, MD  Cardiologist: Dr. Gwenlyn Found  Chief Complaint  Patient presents with  . Follow-up    hospital visit   . Loss of Consciousness  . Tachycardia     History of Present Illness: Faith Jordan is a 22 y.o. female who presents for ongoing assessment and management of dysautonomia because of inappropriate sinus tachycardia. She had medication increase of propranolol to 40 mg but was unable to tolerate this due to nightmares. This was decreased to 20 mg BID. She is a Presenter, broadcasting at [employer].   Since seeing Dr. Gwenlyn Found, she was unable to tolerate the 20 mg twice daily, as she felt very lightheaded and dizzy after taking the morning dose of propanolol.  She changed it back to 40 mg p.o. at nighttime.  Was doing well on it until about 3 weeks ago.  The patient states that she was doing her nursing clinicals, was in the OR watching CABG, when she began to feel flushed dizzy and she felt as if she was going to pass out.  She sat down and OR personnel with her to help her to lift her legs but she lost consciousness.  She was taken to the emergency room where she was not found to be orthostatic, she did have mild anemia with a hemoglobin of 10.4.  The patient states that she is given blood the week before and following that began to have her menses.  The patient states that she is been noticing near syncope more often over the last 3 weeks.  She continues her clinicals, her studies at school, she states that she works out 3 times a week because it makes her feel better.  But she is becoming a little frustrated because it is interfering with her life and she wants to be able to complete nursing school without any further incidences.    Past Medical History:  Diagnosis Date  . Acne   . Angioedema   . Aquagenic angio-edema-urticaria   . Asthma    no problems /not used recently  .  Dysautonomia ([place])   . Dysrhythmia    sinus tachycardia  . Headache(784.0)   . Pneumonia    hx  6th grade  . Vasculitis [facility])     Past Surgical History:  Procedure Laterality Date  . ADENOIDECTOMY    . BREAST LUMPECTOMY Right   . BREAST LUMPECTOMY Right   . MASS EXCISION Right 10/07/2014   Procedure: EXCISION OF RIGHT BREAST MASS;  Surgeon: Jackolyn Confer, MD;  Location: [facility];  Service: General;  Laterality: Right;  . TONSILLECTOMY AND ADENOIDECTOMY    . TOOTH EXTRACTION       Current Outpatient Medications  Medication Sig Dispense Refill  . baclofen (LIORESAL) 10 MG tablet Take 10 mg by mouth daily.  0  . diphenhydrAMINE (BENADRYL) 25 MG tablet Take 50 mg by mouth daily as needed (Angio Edema). Takes with Zantac    . EPINEPHrine 0.3 mg/0.3 mL IJ SOAJ injection Inject into the muscle once.    Marland Kitchen imipramine (TOFRANIL) 25 MG tablet Take 50 mg by mouth at bedtime.     . norgestimate-ethinyl estradiol (ORTHO-CYCLEN,SPRINTEC,PREVIFEM) 0.25-35 MG-MCG tablet Take 1 tablet by mouth daily.    . promethazine (PHENERGAN) 25 MG tablet Take 25 mg by mouth as directed.    . propranolol (INDERAL) 20 MG tablet Take 1 tablet (20  mg total) by mouth daily. IN THE AFTERNOON 30 tablet 3  . XOLAIR 150 MG injection every 30 (thirty) days.     . midodrine (PROAMATINE) 2.5 MG tablet Take 1 tablet (2.5 mg total) by mouth 3 (three) times daily with meals. 90 tablet 3   Current Facility-Administered Medications  Medication Dose Route Frequency Provider Last Rate Last Dose  . omalizumab Arvid Right) injection 300 mg  300 mg Subcutaneous Q28 days Valentina Shaggy, MD   300 mg at 11/30/17 5638    Allergies:   Advil [ibuprofen]; Nsaids; and Sulfa antibiotics    Social History:  The patient  reports that  has never smoked. she has never used smokeless tobacco. She reports that she does not drink alcohol or use drugs.   Family History:  The patient's family history includes Asthma in her other; Cancer in  her maternal grandfather, maternal grandmother, and other; Epilepsy in her other.    ROS: All other systems are reviewed and negative. Unless otherwise mentioned in H&P    PHYSICAL EXAM: VS:  BP 104/60   Pulse 78   Ht 5\' 7"  (1.702 m)   Wt 148 lb (67.1 kg)   LMP 11/24/2017   SpO2 98%   BMI 23.18 kg/m  , BMI Body mass index is 23.18 kg/m. GEN: Well nourished, well developed, in no acute distress  HEENT: normal  Neck: no JVD, carotid bruits, or masses Cardiac: RRR; no murmurs, rubs, or gallops,no edema  Respiratory:  clear to auscultation bilaterally, normal work of breathing GI: soft, nontender, nondistended, + BS MS: no deformity or atrophy  Skin: warm and dry, no rash Neuro:  Strength and sensation are intact Psych: euthymic mood, full affect   EKG: EKG normal sinus rhythm with RSR pattern noted in V1 and V2, heart rate of 88 bpm (on 12/01/2017 in ER)  Recent Labs: 09/28/2017: ALT 8 11/02/2017: TSH 0.77 12/01/2017: BUN 9; Creatinine, Ser 0.77; Hemoglobin 10.3; Platelets 344; Potassium 4.1; Sodium 136    Lipid Panel No results found for: CHOL, TRIG, HDL, CHOLHDL, VLDL, LDLCALC, LDLDIRECT    Wt Readings from Last 3 Encounters:  12/07/17 148 lb (67.1 kg)  11/23/17 149 lb (67.6 kg)  11/02/17 149 lb (67.6 kg)      Other studies Reviewed: Cardiac monitor dated 05/05/2017 Read as sinus bradycardia/sinus rhythm/sinus tachycardia without arrhythmias.  ASSESSMENT AND PLAN:  1.  Dysautonomia: Frequent medication adjustments with propanolol, most recent changed to propanolol 20 mg twice daily.  The patient stopped taking the a.m. dose stating that it was making her really lightheaded and dizzy in the morning.  She started taking the 40 mg at at bedtime with absence of morning time symptoms.  However she has been having worsening symptoms of syncope, near syncope and dizziness.  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  I discussed this with Dr.  Gwenlyn Found who is on site today, as he is very familiar with her.  We also discussed adding admitted drain to assist her with her dizziness although she was not found to be significantly hypotensive.  It is his recommendation that we begin Midrin.  I am going to start her on 2.5 mg 3 times daily.  She can avoid the nighttime dose if she does not feel dizzy or lightheaded but should take it in the morning and in the afternoon.  We are decreasing propanolol to 20 mg at at bedtime, and eliminating the a.m. dose.  Also, Dr. Alvester Chou has recommended that she see Dr. Lovena Le for evaluation of POTS.  She has been advised to stay very hydrated, increase her salt intake, and record her blood pressures throughout the day on the recording sheet and bring it back with her on follow-up appointments with Dr. Lovena Le, and Dr. Gwenlyn Found.   2.Tachycardia: The patient states with use of atenolol this has helped some.  She states that her heart rate is better controlled but she is passing out more.  Hopefully reduction of propranolol to 20 mg at at bedtime will be helpful, will refer to Dr. Lovena Le for any further recommendations.  Current medicines are reviewed at length with the patient today.    Labs/ tests ordered today include:   Phill Myron. [place] Pugh, ANP, AACC   12/07/2017 12:29 PM    [facility] [address], [place], [place] [address] Phone: [phone]; Fax: [phone]

## 2017-12-07 NOTE — Patient Instructions (Signed)
Medication Instructions:  DECREASE PROPRANOLOL 20MG  IN THE EVENING  START MIDODRINE 2.5MG  TAKE IN THE MORNING AND AFTERNOON AND IF NEEDED IN THE EVENING  If you need a refill on your cardiac medications before your next appointment, please call your pharmacy.   Special Instructions:  INCREASE SALT INTAKE EX...POTATO CHIPS, ETC  REFER TO DR Marya Amsler TAYLOR FOR POTTS  TAKE BP DAILY AND LOG BRING WITH YOU TO YOUR APPTS  Follow-Up: Your physician wants you to follow-up in: 3 MONTHS WITH DR Gwenlyn Found.   Thank you for choosing [facility]!!

## 2017-12-07 NOTE — Telephone Encounter (Signed)
Returned call to patient.She stated she has been having fast heart beat.Stated she passed out last week and went to ED.Stated ED Dr.advised she change propranolol to metoprolol.Appointment scheduled with Jory Sims DNP this morning at 11:30 am.

## 2017-12-07 NOTE — Telephone Encounter (Signed)
Pt waiting on call back from several messages she has left.

## 2017-12-12 ENCOUNTER — Telehealth: Payer: Self-pay | Admitting: Adult Health

## 2017-12-12 NOTE — Telephone Encounter (Signed)
Returned the call to [facility] pharmacist. She stated that the patient's mother called them saying that the patient was having depression and anxiety since starting the Midodrine. The pharmacist stated that there is an interaction between the midodrine and the propranolol.  A message has been left on the mother's voicemail to call back for more information.

## 2017-12-12 NOTE — Telephone Encounter (Signed)
New message  Pt c/o medication issue:  1. Name of Medication: midodrine (PROAMATINE) 2.5 MG tablet  2. How are you currently taking this medication (dosage and times per day)? Take 1 tablet (2.5 mg total) by mouth 3 (three) times daily with meals.  3. Are you having a reaction (difficulty breathing--STAT)? yes  4. What is your medication issue? The medication is causing anxiety,and phycological issues. Please call

## 2017-12-12 NOTE — Telephone Encounter (Signed)
Mother called in stating that the daughter has had severe anxiety since starting the Midodrine. She stated that there is also an interaction between the Midodrine and Imipramine (not the propanolol). She would like to know if the daughter can continue with the increased salt intake and water or if there is another medication she can try.

## 2017-12-12 NOTE — Telephone Encounter (Signed)
Patient called back and made aware. She will try taking it once in the morning for a week and call back if the symptoms do not subside.

## 2017-12-12 NOTE — Telephone Encounter (Signed)
Thank you for following up on medications for asthma and side effects correlating with her symptoms.

## 2017-12-12 NOTE — Telephone Encounter (Signed)
D/W MOM Xolair s/e and that it also has many s/e that are similar to the sx that she has been having also (chest tightness, or trouble breathing low blood pressure, dizziness, fainting, rapid or weak heartbeat, anxiety, or feeling of "impending doom" flushing, itching, hives, anxiety, or feeling warm ) informed her to d/w allergist ans see if they have any input with the medications. She states that she will and let us know what they time or have them call to speak with someone here to discuss.

## 2017-12-12 NOTE — Telephone Encounter (Signed)
Reviewed with Jory Sims.  Would like to try to keep patient on this. Have her take only 1 tablet each morning, then only prn later in the day if symptoms of hypotension begin to develop.  See if she can do this for a week to see if anxiety symptoms fade.  She should continue with high salt/water intake.  Interaction with imipramine can actually cause an increase in BP, although the evidence of interaction is somewhat vague and not well defined.

## 2017-12-22 ENCOUNTER — Encounter: Payer: Self-pay | Admitting: Internal Medicine

## 2017-12-22 ENCOUNTER — Ambulatory Visit (INDEPENDENT_AMBULATORY_CARE_PROVIDER_SITE_OTHER): Payer: BLUE CROSS/BLUE SHIELD | Admitting: Internal Medicine

## 2017-12-22 VITALS — BP 136/82 | HR 110 | Ht 67.0 in | Wt 147.0 lb

## 2017-12-22 DIAGNOSIS — G901 Familial dysautonomia [Riley-Day]: Secondary | ICD-10-CM | POA: Diagnosis not present

## 2017-12-22 DIAGNOSIS — R002 Palpitations: Secondary | ICD-10-CM

## 2017-12-22 NOTE — Progress Notes (Signed)
ELECTROPHYSIOLOGY CONSULT NOTE  Patient ID: Faith Jordan, MRN: [id number], DOB/AGE: 1996-09-13 21 y.o. Admit date: (Not on file) Date of Consult: 12/22/2017  Primary Physician: Karleen Dolphin, MD Primary Cardiologist: Deboraha Sprang Chapmon is a 22 y.o. female who is being seen today for the evaluation of tachycardia  at the request of Dr Hiram Comber.    HPI Faith Jordan is a 22 y.o. 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  She has a history of syncope with a stereo typical prodrome  Tingly, flushing.  Episodes can be associated with protracted decreased responsiveness and residual orthostatic intolerance. Recovery diaphoresis.    She is heat intolerant shower intolerant exercise intolerance.  She had been doing relatively well but has started clinicals in nursing.  The first semester she tolerated reasonably second semester she passed out in the operating room during a surgery despite trying to rock while she stood.  She had been up for 4 hours.  She has significant fatigue.  She is been having worsening palpitations with dyspnea on exertion   She is very sad--with anxiety and depression using prn xanax ;  she identifies last 3 or 4 years of her life is extremely stressful.  She has complex migraines as do her sister and mother.  She is treated with imipramine and trigger injections  Her sister has been diagnosed also with POTS  They have tried ProAmatine without benefit.  She has been on Inderal initially with significant benefit but more recently not.  She also has water urticaria  She had breast reconstruction.  With this she had a chest and abdomen wrap.  When she became unwrapped, she lost consciousness.   Past Medical History:  Diagnosis Date  . Acne   . Angioedema   . Aquagenic angio-edema-urticaria   . Asthma    no problems /not used recently  . Dysautonomia ([place])     . Dysrhythmia    sinus tachycardia  . Headache(784.0)   . Pneumonia    hx  6th grade  . Vasculitis [facility])       Surgical History:  Past Surgical History:  Procedure Laterality Date  . ADENOIDECTOMY    . BREAST LUMPECTOMY Right   . BREAST LUMPECTOMY Right   . MASS EXCISION Right 10/07/2014   Procedure: EXCISION OF RIGHT BREAST MASS;  Surgeon: Jackolyn Confer, MD;  Location: [place];  Service: General;  Laterality: Right;  . TONSILLECTOMY AND ADENOIDECTOMY    . TOOTH EXTRACTION       Home Meds: Prior to Admission medications   Medication Sig Start Date End Date Taking? Authorizing Provider  ALPRAZolam Duanne Moron) 0.25 MG tablet Take 0.5 tablets by mouth daily. 12/12/17  Yes [provider]  baclofen (LIORESAL) 10 MG tablet Take 10 mg by mouth daily. 09/11/17  Yes [provider]  diphenhydrAMINE (BENADRYL) 25 MG tablet Take 50 mg by mouth daily as needed (Angio Edema). Takes with Zantac   Yes [provider]  EPINEPHrine 0.3 mg/0.3 mL IJ SOAJ injection Inject into the muscle once.   Yes [provider]  imipramine (TOFRANIL) 25 MG tablet Take 50 mg by mouth at bedtime.  09/24/17  Yes [provider]  midodrine (PROAMATINE) 2.5 MG tablet Take 1 tablet (2.5 mg total) by mouth 3 (three) times daily with meals. 12/07/17  Yes Lendon Colonel, NP  norgestimate-ethinyl  estradiol (ORTHO-CYCLEN,SPRINTEC,PREVIFEM) 0.25-35 MG-MCG tablet Take 1 tablet by mouth daily.   Yes [provider]  promethazine (PHENERGAN) 25 MG tablet Take 25 mg by mouth as directed.   Yes [provider]  propranolol (INDERAL) 20 MG tablet Take 1 tablet (20 mg total) by mouth daily. IN THE AFTERNOON 12/07/17  Yes Lendon Colonel, NP  XOLAIR 150 MG injection every 30 (thirty) days.  10/17/17  Yes [provider]    Allergies:  Allergies  Allergen Reactions  . Advil [Ibuprofen] Dermatitis    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, blistering peeling  skin  . Nsaids Dermatitis    Reaction similar to Stephens-Johnson, blistering peeling rash  . Sulfa Antibiotics Swelling    Facial swelling    Social History   Socioeconomic History  . Marital status: Single    Spouse name: Not on file  . Number of children: Not on file  . Years of education: Not on file  . Highest education level: Not on file  Social Needs  . Financial resource strain: Not on file  . Food insecurity - worry: Not on file  . Food insecurity - inability: Not on file  . Transportation needs - medical: Not on file  . Transportation needs - non-medical: Not on file  Occupational History  . Not on file  Tobacco Use  . Smoking status: Never Smoker  . Smokeless tobacco: Never Used  Substance and Sexual Activity  . Alcohol use: No  . Drug use: No  . Sexual activity: Not on file  Other Topics Concern  . Not on file  Social History Narrative  . Not on file     Family History  Problem Relation Age of Onset  . Cancer Maternal Grandmother   . Cancer Maternal Grandfather   . Asthma Other   . Cancer Other   . Epilepsy Other      ROS:  Please see the history of present illness.     All other systems reviewed and negative.    Physical Exam: Blood pressure 136/82, pulse (!) 110, height 5\' 7"  (1.702 m), weight 147 lb (66.7 kg), last menstrual period 11/24/2017, SpO2 98 %. General: Well developed, well nourished female in no acute distress. Head: Normocephalic, atraumatic, sclera non-icteric, no xanthomas, nares are without discharge. EENT: normal  Lymph Nodes:  none Neck: Negative for carotid bruits. JVD not elevated. Back:without scoliosis kyphosis  Lungs: Clear bilaterally to auscultation without wheezes, rales, or rhonchi. Breathing is unlabored. Heart: RRR with S1 S2. No  murmur . No rubs, or gallops appreciated. Abdomen: Soft, non-tender, non-distended with normoactive bowel sounds. No hepatomegaly. No rebound/guarding. No obvious abdominal masses. [name]:   Strength and tone appear normal for age. Extremities: No clubbing or cyanosis. No edema.  Distal pedal pulses are 2+ and equal bilaterally. Skin: Warm and Dry Neuro: Alert and oriented X 3. CN III-XII intact Grossly normal sensory and motor function . Psych:  Responds to questions appropriately with a normal affect.      Labs: Cardiac Enzymes No results for input(s): CKTOTAL, CKMB, TROPONINI in the last 72 hours. CBC Lab Results  Component Value Date   WBC 8.4 12/01/2017   HGB 10.3 (L) 12/01/2017   HCT 32.4 (L) 12/01/2017   MCV 81.0 12/01/2017   PLT 344 12/01/2017   PROTIME: No results for input(s): LABPROT, INR in the last 72 hours. Chemistry No results for input(s): NA, K, CL, CO2, BUN, CREATININE, CALCIUM, PROT, BILITOT, ALKPHOS, ALT, AST, GLUCOSE in the last  168 hours.  Invalid input(s): LABALBU Lipids No results found for: CHOL, HDL, LDLCALC, TRIG BNP No results found for: PROBNP Thyroid Function Tests: No results for input(s): TSH, T4TOTAL, T3FREE, THYROIDAB in the last 72 hours.  Invalid input(s): FREET3 Miscellaneous Lab Results  Component Value Date   DDIMER <0.27 05/05/2017    Radiology/Studies:  No results found.  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  She has evidence of resting tachycardia today which is quite distinct from her visit earlier this calendar year.  She was noted to be anemic 3 weeks ago.  This occurred in the context of having given blood.  She has modestly heavy periods although they are regulated by birth control.  We discussed extensively the issues of dysautonomia, the physiology of orthstasis and positional stress.  We discussed the role of salt and water repletion, the importance of exercise, often needing to be  started in the recumbent position, and the awareness of triggers and the role of ambient heat and dehydration  She is exercising and this is to her credit.     she is salt averse.  Have given her prescription names for salt supplements to take 2 twice a day.  I have asked her to follow-up with her gynecologist to consider strategies to try to further eliminate her menses  Encouraged "soul care "to deal with the depression and anxiety that have been part and parcel of this chronic illness which informs every day with concerns about going to school--either in terms of pharmacotherapy and/or cognitive therapy  I have mentioned that we will be glad to give her letters to seek accommodation for driving  and school  She should not give blood.  Avoid alcohol.  Compressive wear for example spanks        Virl Axe

## 2017-12-22 NOTE — Patient Instructions (Signed)
Medication Instructions:  Your physician has recommended you make the following change in your medication:  1) Stop midodrine (PROAMATINE) 2.5 MG tablet   Labwork: None ordered   Testing/Procedures: None ordered   Follow-Up: Your physician recommends that you schedule a follow-up appointment in: 6 weeks with Dr Caryl Comes   Any Other Special Instructions Will Be Listed Below (If Applicable).  Try the salt replacements that Dr Caryl Comes suggested and wrote down for you SPANX--knee to under breast--full body OBGYN--call to discuss suppression of period--Dr Dian Queen Do not stand for hours Also look into GENOMIND--for testing      If you need a refill on your cardiac medications before your next appointment, please call your pharmacy.

## 2017-12-29 ENCOUNTER — Ambulatory Visit: Payer: Self-pay

## 2017-12-30 ENCOUNTER — Ambulatory Visit (INDEPENDENT_AMBULATORY_CARE_PROVIDER_SITE_OTHER): Payer: BLUE CROSS/BLUE SHIELD

## 2017-12-30 DIAGNOSIS — L500 Allergic urticaria: Secondary | ICD-10-CM | POA: Diagnosis not present

## 2018-01-04 ENCOUNTER — Ambulatory Visit (INDEPENDENT_AMBULATORY_CARE_PROVIDER_SITE_OTHER): Payer: BLUE CROSS/BLUE SHIELD | Admitting: Primary Care

## 2018-01-04 ENCOUNTER — Encounter: Payer: Self-pay | Admitting: Primary Care

## 2018-01-04 VITALS — BP 104/64 | HR 87 | Temp 98.0°F | Ht 66.75 in | Wt 148.2 lb

## 2018-01-04 DIAGNOSIS — T783XXD Angioneurotic edema, subsequent encounter: Secondary | ICD-10-CM | POA: Diagnosis not present

## 2018-01-04 DIAGNOSIS — G901 Familial dysautonomia [Riley-Day]: Secondary | ICD-10-CM

## 2018-01-04 DIAGNOSIS — D649 Anemia, unspecified: Secondary | ICD-10-CM | POA: Insufficient documentation

## 2018-01-04 LAB — CBC
HCT: 34.5 % — ABNORMAL LOW (ref 36.0–46.0)
Hemoglobin: 11.3 g/dL — ABNORMAL LOW (ref 12.0–15.0)
MCHC: 32.8 g/dL (ref 30.0–36.0)
MCV: 74.9 fl — ABNORMAL LOW (ref 78.0–100.0)
Platelets: 350 10*3/uL (ref 150.0–400.0)
RBC: 4.6 Mil/uL (ref 3.87–5.11)
RDW: 14.8 % (ref 11.5–15.5)
WBC: 7.7 10*3/uL (ref 4.0–10.5)

## 2018-01-04 NOTE — Assessment & Plan Note (Signed)
Repeat CBC today. Continue oral iron once weekly.

## 2018-01-04 NOTE — Assessment & Plan Note (Addendum)
Following with allergy clinic and receiving monthly injections of Xolair. Continue same.

## 2018-01-04 NOTE — Assessment & Plan Note (Addendum)
Following with cardiology. Continue propranolol 20 mg tablets. Discussed to use alprazolam very sparingly to avoid addiction/abuse. Anxiety should be improving since off of midodrine. Cannot take SSRI's.  Repeat TSH this Summer.

## 2018-01-04 NOTE — Progress Notes (Signed)
Subjective:    Patient ID: Faith Jordan, female    DOB: 06-12-1996, 22 y.o.   MRN: [id number]  HPI  Faith Jordan is a 22 year old female who presents today to establish care and discuss the problems mentioned below. Will obtain old records. Her last physical was in March 2018.   1) Autonomic Nervous System Disease/Neurocardiogenic Syncope: Diagnosed in 2015. She is currently following with cardiology and is under work up for POTS. Currently managed on propranolol 20 mg, alprazolam 0.25 mg PRN. She's using her alprazolam every 1-2 weeks. She doesn't have a problem with anxiety, but did have anxiety when put on midodrine which was discontinued recently.   2) Hives/Angio-Edema: Diagnosed in 5th grade. Currently managed on Xolair 150 mg injections monthly. Was told by allergist that she is at risk for hypothyroidism. She experiences symptoms of fatigue, weight fluctuations. Family history of hypothyroidism in her mother.  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 Currently managed on imipramine 50 mg HS;  promethazine and baclofen PRN. She also receives trigger injection therapy every 2 weeks.   4) Anemia: Hemoglobin of 10.3 in February 2019 after donating blood and a heavy menstrual cycle. She is currently managed on 325 mg of Ferrous Sulfate once weekly. She has felt more energetic lately. She is managed on OCP's for migraines.   Review of Systems  Constitutional: Negative for fatigue.  Respiratory: Negative for shortness of breath.   Cardiovascular: Negative for chest pain.  Allergic/Immunologic: Positive for environmental allergies.  Neurological: Negative for dizziness.       History of migraines  Psychiatric/Behavioral: The patient is not nervous/anxious.        Past Medical History:  Diagnosis Date  . Acne   . Angioedema   . Aquagenic angio-edema-urticaria   . Asthma    no problems /not used recently  . Dysautonomia ([place])   .  Dysrhythmia    sinus tachycardia  . Fibroid    right breast, adenoma  . Headache(784.0)   . Pneumonia    hx  6th grade  . Vasculitis ([place])      Social History   Socioeconomic History  . Marital status: Single    Spouse name: Not on file  . Number of children: Not on file  . Years of education: Not on file  . Highest education level: Not on file  Social Needs  . Financial resource strain: Not on file  . Food insecurity - worry: Not on file  . Food insecurity - inability: Not on file  . Transportation needs - medical: Not on file  . Transportation needs - non-medical: Not on file  Occupational History  . Not on file  Tobacco Use  . Smoking status: Never Smoker  . Smokeless tobacco: Never Used  Substance and Sexual Activity  . Alcohol use: No  . Drug use: No  . Sexual activity: Not on file  Other Topics Concern  . Not on file  Social History Narrative   Student at [employer], [place].    Enjoys reading.   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     Past Surgical History:  Procedure Laterality Date  . BREAST LUMPECTOMY Right   . BREAST LUMPECTOMY Right   . MASS EXCISION Right 10/07/2014   Procedure: EXCISION OF RIGHT BREAST MASS;  Surgeon: Jackolyn Confer, MD;  Location: [place];  Service: General;  Laterality: Right;  . TONSILLECTOMY AND ADENOIDECTOMY    .  TOOTH EXTRACTION      Family History  Problem Relation Age of Onset  . Breast cancer Maternal Grandmother 20  . Myasthenia gravis Maternal Grandfather   . Migraines Mother   . Narcolepsy Mother   . Hypothyroidism Mother   . Asthma Other   . Cancer Other   . Epilepsy Other     Allergies  Allergen Reactions  . 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 Nsaids Dermatitis    Reaction similar to Stephens-Johnson, blistering peeling rash  . Sulfa Antibiotics Swelling    Facial swelling    Current Outpatient Medications on File Prior to Visit    Medication Sig Dispense Refill  . ALPRAZolam (XANAX) 0.25 MG tablet Take 0.5 tablets by mouth daily.  0  . baclofen (LIORESAL) 10 MG tablet Take 10 mg by mouth daily.  0  . diphenhydrAMINE (BENADRYL) 25 MG tablet Take 50 mg by mouth daily as needed (Angio Edema). Takes with Zantac    . EPINEPHrine 0.3 mg/0.3 mL IJ SOAJ injection Inject into the muscle once.    Marland Kitchen imipramine (TOFRANIL) 25 MG tablet Take 50 mg by mouth at bedtime.     . norgestimate-ethinyl estradiol (ORTHO-CYCLEN,SPRINTEC,PREVIFEM) 0.25-35 MG-MCG tablet Take 1 tablet by mouth daily.    . promethazine (PHENERGAN) 25 MG tablet Take 25 mg by mouth as directed.    . propranolol (INDERAL) 20 MG tablet Take 1 tablet (20 mg total) by mouth daily. IN THE AFTERNOON 30 tablet 3  . XOLAIR 150 MG injection every 30 (thirty) days.      Current Facility-Administered Medications on File Prior to Visit  Medication Dose Route Frequency Provider Last Rate Last Dose  . omalizumab Arvid Right) injection 300 mg  300 mg Subcutaneous Q28 days Valentina Shaggy, MD   300 mg at 12/30/17 0928    BP 104/64   Pulse 87   Temp 98 F (36.7 C) (Oral)   Ht 5' 6.75" (1.695 m)   Wt 148 lb 4 oz (67.2 kg)   LMP 12/25/2017   SpO2 99%   BMI 23.39 kg/m    Objective:   Physical Exam  Constitutional: She appears well-nourished.  Neck: Neck supple.  Cardiovascular: Normal rate and regular rhythm.  Pulmonary/Chest: Effort normal and breath sounds normal.  Skin: Skin is warm and dry.  Psychiatric: She has a normal mood and affect.          Assessment & Plan:

## 2018-01-04 NOTE — Patient Instructions (Signed)
Stop by the lab prior to leaving today. I will notify you of your results once received.   Please schedule a physical with me in July 2019, we'll repeat some labs at that point.  It was a pleasure to meet you today! Please don't hesitate to call or message me with any questions. Welcome to [facility]!

## 2018-01-05 ENCOUNTER — Telehealth: Payer: Self-pay

## 2018-01-05 ENCOUNTER — Encounter: Payer: Self-pay | Admitting: *Deleted

## 2018-01-05 DIAGNOSIS — Z111 Encounter for screening for respiratory tuberculosis: Secondary | ICD-10-CM

## 2018-01-05 NOTE — Telephone Encounter (Signed)
Copied from [place] [id number]. Topic: Appointment Scheduling - Scheduling Inquiry for Clinic >> Jan 05, 2018  3:30 PM Percell Belt A wrote: Reason for CRM: pt would like to know is she could get the TB blood draw Lab test instead of the 2 step skin test?  Could an order be put in for pt to go to lab?

## 2018-01-06 NOTE — Telephone Encounter (Signed)
Lab order placed.

## 2018-01-06 NOTE — Telephone Encounter (Signed)
Left message on VM per DPR for pt to call the office and schedule a Lab Visit.

## 2018-01-10 ENCOUNTER — Other Ambulatory Visit (INDEPENDENT_AMBULATORY_CARE_PROVIDER_SITE_OTHER): Payer: BLUE CROSS/BLUE SHIELD

## 2018-01-10 DIAGNOSIS — Z111 Encounter for screening for respiratory tuberculosis: Secondary | ICD-10-CM | POA: Diagnosis not present

## 2018-01-12 ENCOUNTER — Encounter: Payer: Self-pay | Admitting: *Deleted

## 2018-01-12 ENCOUNTER — Encounter (INDEPENDENT_AMBULATORY_CARE_PROVIDER_SITE_OTHER): Payer: Self-pay

## 2018-01-12 LAB — QUANTIFERON-TB GOLD PLUS
Mitogen-NIL: >10 IU/mL
NIL: 0.04 IU/mL
QuantiFERON-TB Gold Plus: NEGATIVE
TB1-NIL: <0 IU/mL
TB2-NIL: <0 IU/mL

## 2018-01-27 ENCOUNTER — Ambulatory Visit (INDEPENDENT_AMBULATORY_CARE_PROVIDER_SITE_OTHER): Payer: BLUE CROSS/BLUE SHIELD | Admitting: *Deleted

## 2018-01-27 DIAGNOSIS — L500 Allergic urticaria: Secondary | ICD-10-CM

## 2018-02-02 IMAGING — DX DG CHEST 2V
2 series · 2 of 2 positions shown · non-contrast
Comparison: 03/31/2013

CLINICAL DATA: Chest pain and tachycardia

EXAM:
CHEST  2 VIEW

[w chest pa]
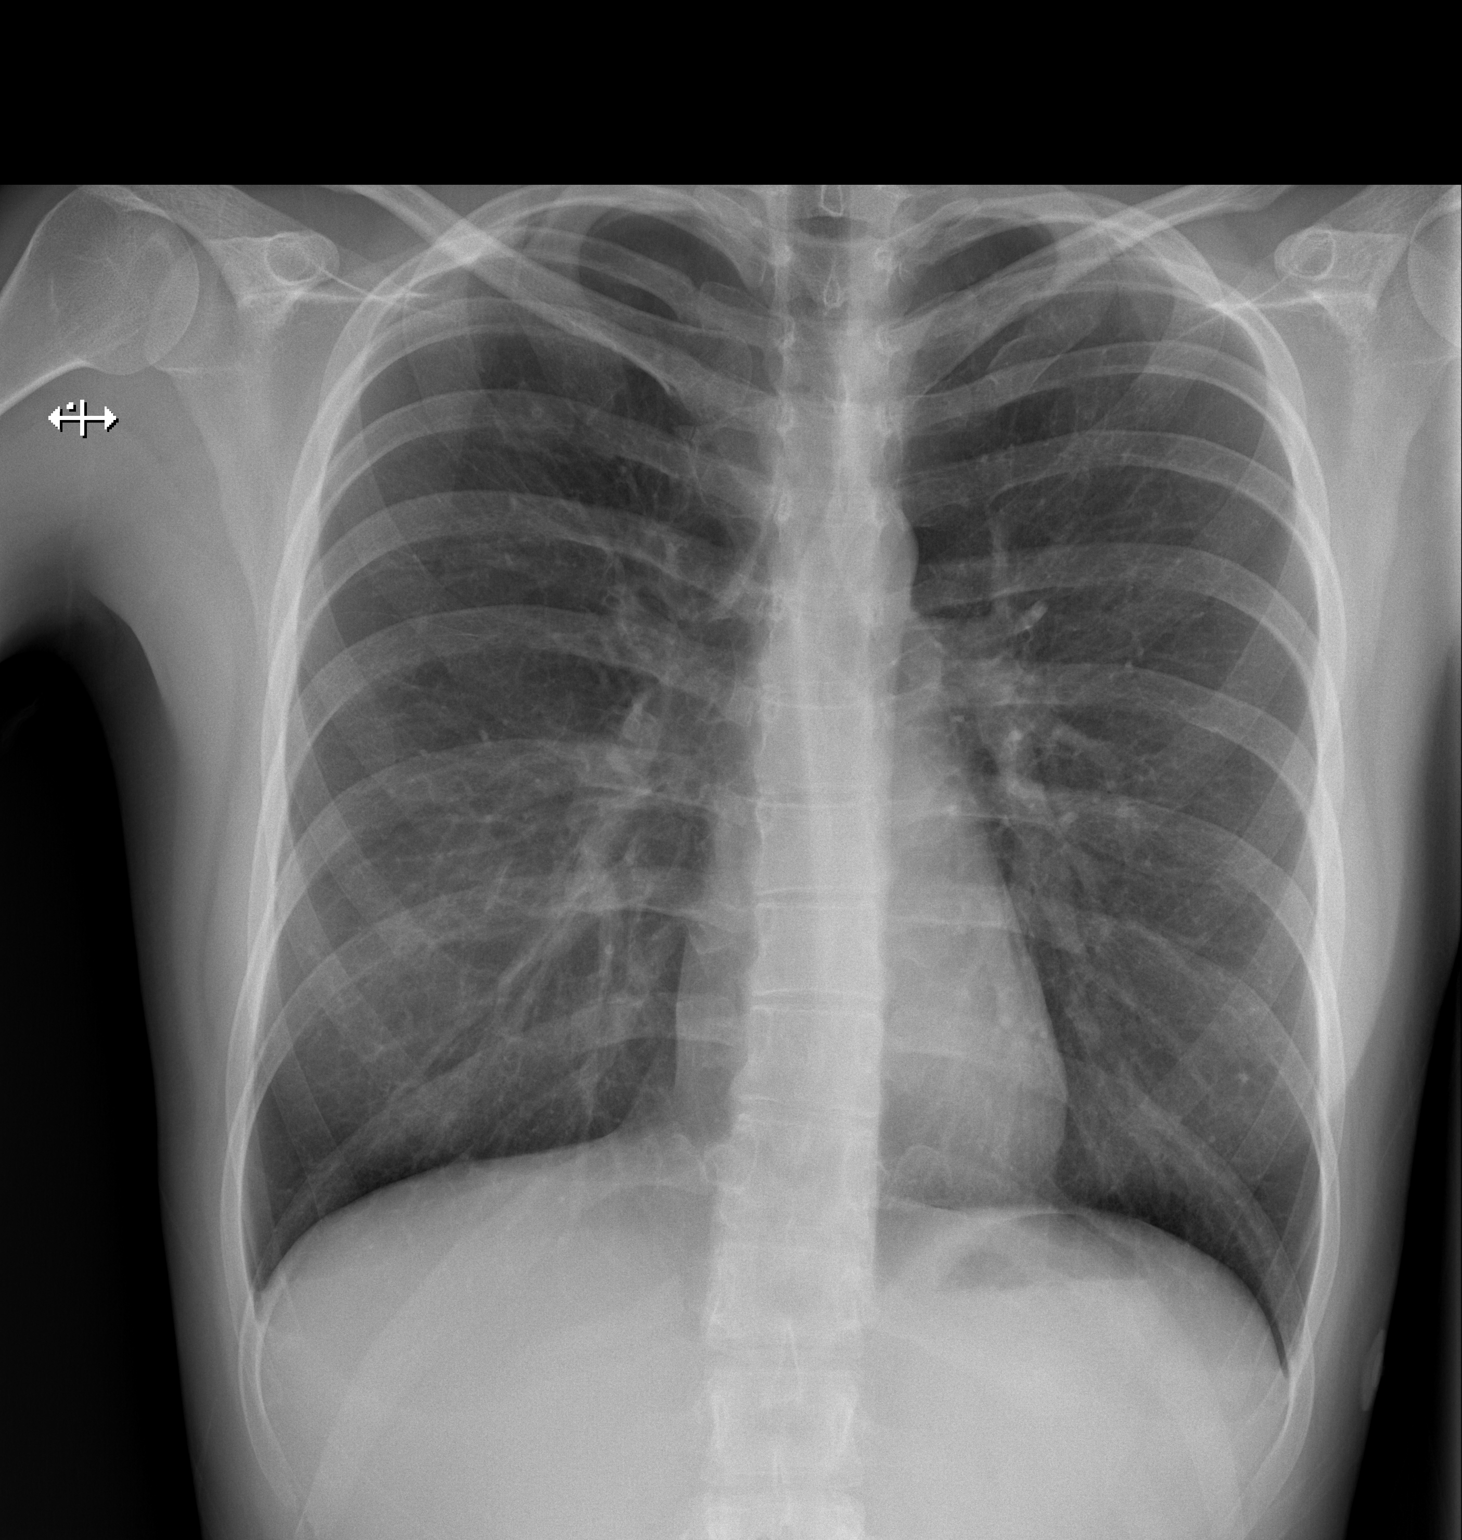

[w chest lat]
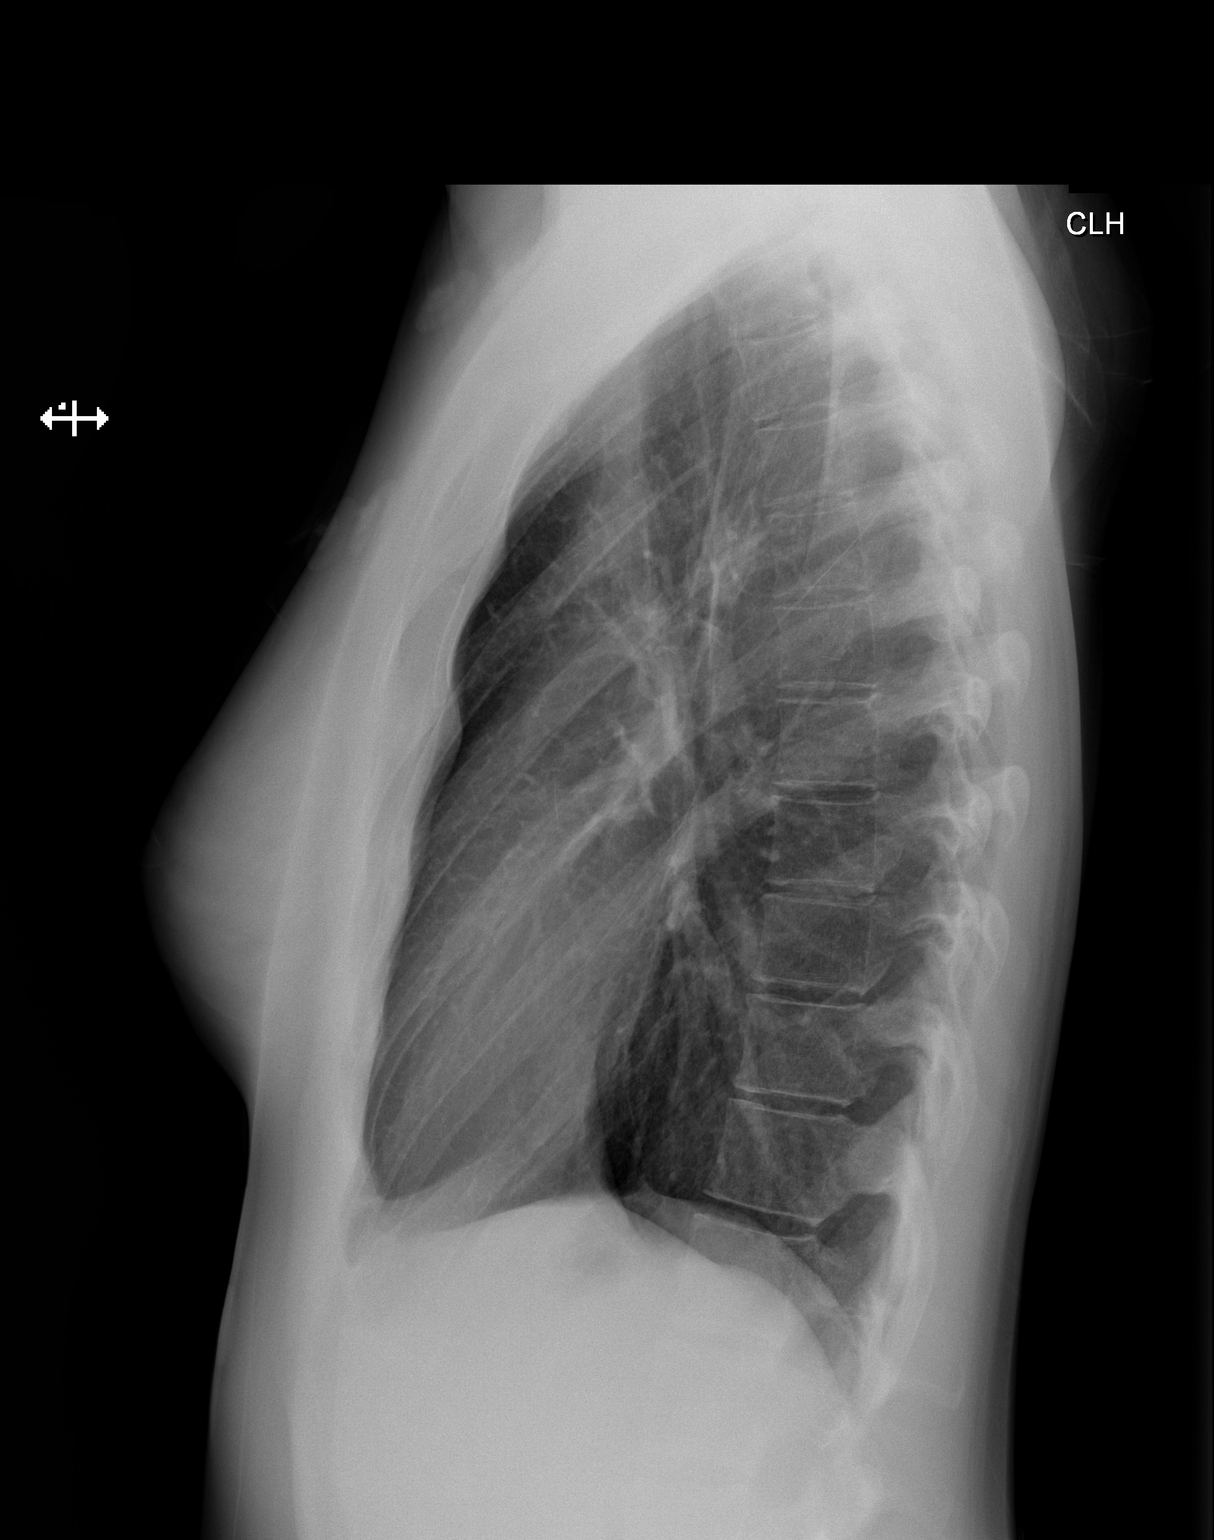

[2 of 2 positions shown; findings below may reference images not displayed]

FINDINGS: The heart size and mediastinal contours are within normal limits.
Both lungs are clear. The visualized skeletal structures are
unremarkable.
IMPRESSION: No active cardiopulmonary disease.

## 2018-02-14 ENCOUNTER — Ambulatory Visit (INDEPENDENT_AMBULATORY_CARE_PROVIDER_SITE_OTHER): Payer: BLUE CROSS/BLUE SHIELD | Admitting: Internal Medicine

## 2018-02-14 VITALS — BP 110/70 | HR 94 | Ht 67.0 in | Wt 149.0 lb

## 2018-02-14 DIAGNOSIS — R002 Palpitations: Secondary | ICD-10-CM | POA: Diagnosis not present

## 2018-02-14 DIAGNOSIS — G901 Familial dysautonomia [Riley-Day]: Secondary | ICD-10-CM | POA: Diagnosis not present

## 2018-02-14 NOTE — Progress Notes (Signed)
Patient Care Team: Pleas Koch, NP as PCP - General (Internal Medicine)   HPI  Faith Jordan is a 22 y.o. female Is seen in follow-up for dysautonomia and migraine headaches  She is moderately improved.  She is taking her showers at night which is helped.  She is continuing to exercise albeit she finds it recumbent exercises easier.  Salt and water repletion is ongoing.  She is seeing her gynecologist who will be changing her contraception to every 3 months.  She is also talked with her headache doctor; he is reluctant to stop imipramine.  She continues to struggle with standing.  She is concerned about the impact of the upcoming heat     Records and Results Reviewed   Past Medical History:  Diagnosis Date  . Acne   . Angioedema   . Aquagenic angio-edema-urticaria   . Asthma    no problems /not used recently  . Dysautonomia ([place])   . Dysrhythmia    sinus tachycardia  . Fibroid    right breast, adenoma  . Headache(784.0)   . Pneumonia    hx  6th grade  . Vasculitis [facility])     Past Surgical History:  Procedure Laterality Date  . BREAST LUMPECTOMY Right   . BREAST LUMPECTOMY Right   . MASS EXCISION Right 10/07/2014   Procedure: EXCISION OF RIGHT BREAST MASS;  Surgeon: Jackolyn Confer, MD;  Location: [place];  Service: General;  Laterality: Right;  . TONSILLECTOMY AND ADENOIDECTOMY    . TOOTH EXTRACTION      Current Meds  Medication Sig  . ALPRAZolam (XANAX) 0.25 MG tablet Take 0.25 mg by mouth daily as needed for anxiety.  . baclofen (LIORESAL) 10 MG tablet Take 10 mg by mouth daily as needed for muscle spasms (and headaches).  . diphenhydrAMINE (BENADRYL) 25 MG tablet Take 50 mg by mouth daily as needed (Angio Edema). Takes with Zantac  . EPINEPHrine 0.3 mg/0.3 mL IJ SOAJ injection Inject into the muscle once.  Marland Kitchen imipramine (TOFRANIL) 25 MG tablet Take 50 mg by mouth at bedtime.   . norgestimate-ethinyl estradiol (ORTHO-CYCLEN,SPRINTEC,PREVIFEM)  0.25-35 MG-MCG tablet Take 1 tablet by mouth daily.  . promethazine (PHENERGAN) 25 MG tablet Take 25 mg by mouth as directed.  . propranolol (INDERAL) 20 MG tablet Take 1 tablet (20 mg total) by mouth daily. IN THE AFTERNOON  . XOLAIR 150 MG injection every 30 (thirty) days.    Current Facility-Administered Medications for the 02/14/18 encounter (Office Visit) with Deboraha Sprang, MD  Medication  . omalizumab Arvid Right) injection 300 mg    Allergies  Allergen Reactions  . 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 Nsaids Dermatitis    Reaction similar to Stephens-Johnson, blistering peeling rash  . Sulfa Antibiotics Swelling    Facial swelling      Review of Systems negative except from HPI and PMH  Physical Exam BP 110/70   Pulse 94   Ht 5\' 7"  (1.702 m)   Wt 149 lb (67.6 kg)   SpO2 99%   BMI 23.34 kg/m  Well developed and well nourished in no acute distress HENT normal E scleral and icterus clear Neck Supple JVP flat; carotids brisk and full Clear to ausculation Regular rate and rhythm, no murmurs gallops or rub Soft with active bowel sounds No clubbing cyanosis Edema Alert and oriented, grossly normal motor and sensory function Skin Warm and Dry  Assessment and  Plan   Dysautonomia  Migraine headaches  Water urticaria  Elevated blood pressure  Anemia  Her dysautonomia is somewhat improved.  She has not tolerated the spanks because of heat.  I suggested thigh sleeves as well as compression knee highs.  Perhaps spanks work better in the winter.  Encouraged ongoing exercise with rehydration.  She finds walking to the hospital is hard in the summer related to parking, will give her a handicap sticker.  If she is asked for referral for a second opinion on her headaches.  >>  [facility].  Blood pressure doing well but some edema   Compression stockings as   More than 50% of 45 min was spent in  counseling related to the above   Current medicines are reviewed at length with the patient today .  The patient does not  have concerns regarding medicines.

## 2018-02-14 NOTE — Patient Instructions (Addendum)
  Medication Instructions:  Your physician recommends that you continue on your current medications as directed. Please refer to the Current Medication list given to you today.  Labwork: None ordered.  Testing/Procedures: None ordered.  Follow-Up: Your physician recommends that you schedule a follow-up appointment in:  3 months with Dr Caryl Comes   We have sent a referral to [facility], Neurology- Dr Jaynee Eagles  Any Other Special Instructions Will Be Listed Below (If Applicable).     If you need a refill on your cardiac medications before your next appointment, please call your pharmacy.

## 2018-02-23 ENCOUNTER — Ambulatory Visit (HOSPITAL_COMMUNITY): Payer: BLUE CROSS/BLUE SHIELD | Attending: Internal Medicine

## 2018-02-23 ENCOUNTER — Ambulatory Visit (INDEPENDENT_AMBULATORY_CARE_PROVIDER_SITE_OTHER): Payer: BLUE CROSS/BLUE SHIELD

## 2018-02-23 ENCOUNTER — Other Ambulatory Visit: Payer: Self-pay

## 2018-02-23 DIAGNOSIS — G43909 Migraine, unspecified, not intractable, without status migrainosus: Secondary | ICD-10-CM | POA: Insufficient documentation

## 2018-02-23 DIAGNOSIS — L500 Allergic urticaria: Secondary | ICD-10-CM | POA: Diagnosis not present

## 2018-02-23 DIAGNOSIS — R55 Syncope and collapse: Secondary | ICD-10-CM | POA: Diagnosis not present

## 2018-02-23 DIAGNOSIS — J45909 Unspecified asthma, uncomplicated: Secondary | ICD-10-CM | POA: Diagnosis not present

## 2018-02-23 DIAGNOSIS — R000 Tachycardia, unspecified: Secondary | ICD-10-CM | POA: Insufficient documentation

## 2018-02-23 DIAGNOSIS — G901 Familial dysautonomia [Riley-Day]: Secondary | ICD-10-CM | POA: Insufficient documentation

## 2018-02-23 DIAGNOSIS — R002 Palpitations: Secondary | ICD-10-CM | POA: Insufficient documentation

## 2018-02-27 ENCOUNTER — Other Ambulatory Visit: Payer: Self-pay | Admitting: Adult Health

## 2018-02-28 ENCOUNTER — Ambulatory Visit: Payer: BLUE CROSS/BLUE SHIELD | Admitting: Cardiovascular Disease

## 2018-03-02 ENCOUNTER — Other Ambulatory Visit: Payer: Self-pay | Admitting: Adult Health

## 2018-03-23 ENCOUNTER — Ambulatory Visit: Payer: BLUE CROSS/BLUE SHIELD

## 2018-03-23 ENCOUNTER — Telehealth: Payer: Self-pay

## 2018-03-23 ENCOUNTER — Ambulatory Visit (INDEPENDENT_AMBULATORY_CARE_PROVIDER_SITE_OTHER): Payer: BLUE CROSS/BLUE SHIELD

## 2018-03-23 ENCOUNTER — Encounter: Payer: Self-pay | Admitting: Allergy & Immunology

## 2018-03-23 DIAGNOSIS — L500 Allergic urticaria: Secondary | ICD-10-CM

## 2018-03-23 NOTE — Telephone Encounter (Signed)
I left a message for Faith Jordan to call back with an update for today after she had gotten home from the office. I let her know that I would not be in the office, but someone would call and follow up on her tomorrow as well.

## 2018-03-23 NOTE — Telephone Encounter (Signed)
Patient had reaction to Xolair 03-23-18. Please follow up and see how she is doing today.

## 2018-03-23 NOTE — Progress Notes (Signed)
Patient received 300 mg Xolair subcutaneously at approximately 9 am. She sat down in injection lobby and approximately 5 minutes later c/o lightheadedness and just not feeling right. I attempted to escort her to an exam room at which time she fainted. I caught her prior to hitting the floor. I alerted the LPN in the injection room with me, C. Ardisson, to go get the MD in office. She went to do this immediately. Patient was lowered safely to ground. I called for help and was met by all nursing/CMA staff and Dr. Ernst Bowler. Dr. Ernst Bowler and I lifted patient and carried to an exam room for evaluation by MD. Patient was administered Auvi-Q 0.3 mg (by Ernesta Amble, LPN), initial vitals taken upon administration of the injection. Patient's color returned, she was able to focus on my face. Patient was then lifted to exam table by 3-4 nurses/cma. She was monitored for 1.5 hours. Mother was notified of issue, mom came in and was driving patient home.

## 2018-03-23 NOTE — Telephone Encounter (Signed)
Patient called back and states she is doing okay now and just a little sluggish. She has been napping all afternoon. I advised to call us with any concerns and that if she called after hours there would be someone available to speak with her.   Please route back to clinical pool.

## 2018-03-24 NOTE — Telephone Encounter (Signed)
Faith Jordan seems to have had a likely vasovagal response to her Xolair after previously tolerating it on multiple occasions. She does have a history of fainting and in fact wears a bracelet warning people about this. Vitals remained stable during the entire episode. We did get AuviQ immediately to which she responded well. She did develop some nausea shortly thereafter, so we did given 20mg  cetirizine to help combat this. I was still not convinced that this was anaphylactic in nature, so I deferred on prednisone. The normal vital signs throughout were very reassuring.   Faith Jordan does not wish to stop the Xolair since this has been "life changing" for her. Therefore we will institute some precautions including sitting for every injection and premedicating with an antihistamine on the morning of injections. Patient and her mother in agreement with the plan.  Salvatore Marvel, MD [facility] [place] [facility] [place]

## 2018-04-24 ENCOUNTER — Telehealth: Payer: Self-pay

## 2018-04-24 ENCOUNTER — Ambulatory Visit (INDEPENDENT_AMBULATORY_CARE_PROVIDER_SITE_OTHER): Payer: BLUE CROSS/BLUE SHIELD

## 2018-04-24 DIAGNOSIS — L500 Allergic urticaria: Secondary | ICD-10-CM | POA: Diagnosis not present

## 2018-04-24 MED ORDER — EPINEPHRINE (ANAPHYLAXIS) 1 MG/ML IJ SOLN
0.3000 mg | Freq: Once | INTRAMUSCULAR | Status: AC
  Administered 2018-04-24: 0.3 mg via INTRAMUSCULAR

## 2018-04-24 NOTE — Telephone Encounter (Signed)
Patient came in today and received her Xolair injections and passed out a few mins after. Patient was seated during her injections. Could someone please call her tomorrow 04/25/2018 and followed up.

## 2018-04-24 NOTE — Addendum Note (Signed)
Addended by: Horris Latino on: 04/24/2018 04:56 PM   Modules accepted: Orders

## 2018-04-24 NOTE — Progress Notes (Signed)
Pt came today for Xolair injection.  Pt was seated for this injection today as she had a vasovagal episode with last xolair injection (she was standing for that injection).  After receiving her xolair injection today she again vasovagaled.  Mother present who states she has dysautonomia and has fainting episodes (followed by Dr. Virl Axe [phone]).   Mother states this episode was "mild".   Pt was quickly examined and given a dose of IM epi.   She had good breath sounds, HR and with 99% O2 sats.  BP elevated in 150s/80s.   After receiving epi dose she was brought to room and laid in supine position.  She was able to converse.  After 30 minutes of supine position she stated she was feeling better and she was changed in to inclined position which she was observed for another 15-20 minutes.  She states she was feeling back to normal and was ready to leave.   Discharge vitals obtained and were normal.   Will discuss with Dr. Ernst Bowler for future injections.  Did discuss with pt tentative plan will be to have her schedule an appt to have a room and will have her in supine position for her injection with observation time afterward.

## 2018-04-25 ENCOUNTER — Ambulatory Visit (INDEPENDENT_AMBULATORY_CARE_PROVIDER_SITE_OTHER): Payer: BLUE CROSS/BLUE SHIELD | Admitting: Neurology

## 2018-04-25 ENCOUNTER — Encounter: Payer: Self-pay | Admitting: Neurology

## 2018-04-25 VITALS — BP 121/84 | HR 86 | Ht 67.0 in | Wt 156.0 lb

## 2018-04-25 DIAGNOSIS — G43409 Hemiplegic migraine, not intractable, without status migrainosus: Secondary | ICD-10-CM | POA: Diagnosis not present

## 2018-04-25 DIAGNOSIS — G43401 Hemiplegic migraine, not intractable, with status migrainosus: Secondary | ICD-10-CM | POA: Diagnosis not present

## 2018-04-25 DIAGNOSIS — G43711 Chronic migraine without aura, intractable, with status migrainosus: Secondary | ICD-10-CM | POA: Diagnosis not present

## 2018-04-25 DIAGNOSIS — G43909 Migraine, unspecified, not intractable, without status migrainosus: Secondary | ICD-10-CM | POA: Insufficient documentation

## 2018-04-25 MED ORDER — GALCANEZUMAB-GNLM 120 MG/ML ~~LOC~~ SOAJ: 120.0000 mg | SUBCUTANEOUS | 11 refills | Status: DC

## 2018-04-25 MED ORDER — GALCANEZUMAB-GNLM 120 MG/ML ~~LOC~~ SOAJ: 240.0000 mg | Freq: Once | SUBCUTANEOUS | 0 refills | Status: AC

## 2018-04-25 NOTE — Patient Instructions (Signed)
Galcanezumab: Drug information [facility] here. Copyright [phone] [employer] All rights reserved. (For additional information see "Galcanezumab: Patient drug information")  For abbreviations and symbols that may be used in [employer] (show table) Brand Names: [place]  Emgality  Pharmacologic Category  Monoclonal Antibody, Calcitonin Gene-Related Peptide (CGRP) Receptor Antagonist  Dosing: Adult Cluster headache (prevention): SubQ: 300 mg at the onset of the cluster period and then once monthly until the end of the cluster period. Migraine prophylaxis: SubQ: Initial: 240 mg as a single loading dose, followed by 120 mg once monthly. Missed dose: Administer missed doses as soon as possible and schedule monthly from the date of the last injection. Dosing: Renal Impairment: Adult There are no dosage adjustments provided in the manufacturer's labeling (has not been studied); renal impairment is not expected to change the pharmacokinetics of galcanezumab. Dosing: Hepatic Impairment: Adult There are no dosage adjustments provided in the manufacturer's labeling (has not been studied); hepatic impairment is not expected to change the pharmacokinetics of galcanezumab. Dosing: Geriatric Refer to adult dosing. Dosage Forms: [place] Excipient information presented when available (limited, particularly for generics); consult specific product labeling. Solution Auto-injector, Subcutaneous [preservative free]:  Emgality: galcanezumab-gnlm 120 mg/mL (1 mL) [contains polysorbate 80] Solution Prefilled Syringe, Subcutaneous [preservative free]:  Emgality: Galcanezumab-gnlm 100 mg/mL (1 mL); galcanezumab-gnlm 120 mg/mL (1 mL) [latex free; contains polysorbate 80] Generic Equivalent Available: [place] No Administration: Adult SubQ: For subcutaneous use only; intended for self-administration. Keep out of direct sunlight. Prior to administration, allow to come to room temperature for 30 minutes. Do not  warm using a heat source (eg, microwave or hot water). Do not shake. Administer in abdomen (avoiding 2 inches around the navel), thigh, upper arm, or buttocks avoiding areas of skin that are tender, bruised, red or hard. Deliver entire contents of single-use prefilled pen or syringe. Administer the 240 mg loading dose as 2 injections of 120 mg, one after another, and the 300 mg dose as 3 injections of 100 mg, one after another. Use: Labeled Indications Cluster headache (prevention): Preventative treatment of cluster headache during cluster episodes in adults. Migraine prophylaxis: Preventive treatment of migraine in adults. Adverse Reactions >10%: Immunologic: Antibody development (5% to 13%; neutralizing: ?50%) Local: Injection site reaction (18%) Frequency not defined: Hypersensitivity: Hypersensitivity reaction Contraindications Serious hypersensitivity to galcanezumab or any component of the formulation. Warnings/Precautions Concerns related to adverse effects: Marland Kitchen Hypersensitivity: Hypersensitivity reactions, including dyspnea, rash, and urticaria have been reported; reactions may occur days after administration and may be prolonged. Discontinue use and initiate appropriate therapy if hypersensitivity reactions occur. Disease-related concerns: . Cardiovascular disease: Patients with ECG abnormalities compatible with an acute cardiovascular event and patients with a recent history (within 6 months) of stroke, MI, unstable angina, percutaneous coronary intervention, coronary artery bypass grafting, deep vein thrombosis, or pulmonary embolism were excluded from clinical trials; use with caution in these patients. . Peripheral vascular disease: Patients with history of stroke, intracranial or carotid aneurysm, intracranial hemorrhage, vasospastic angina or Raynaud disease, or clinical evidence of peripheral vascular disease were excluded from cluster headache clinical trials; use with caution in these  patients. Dosage form specific issues: . Polysorbate 80: Some dosage forms may contain polysorbate 80 (also known as Tweens). Hypersensitivity reactions, usually a delayed reaction, have been reported following exposure to pharmaceutical products containing polysorbate 80 in certain individuals (Isaksson 2002; Lucente 2000; [place]). Thrombocytopenia, ascites, pulmonary deterioration, and renal and hepatic failure have been reported in premature neonates after receiving parenteral products containing polysorbate 80 (Alade  1986; CDC 1984). See manufacturer's labeling. Other warnings/precautions: . Appropriate use: Patients on any other migraine or cluster headache preventive treatment and patients with medication overuse headache were excluded from clinical trials; use with caution in these patients. . Immunogenicity: Anti-galcanezumab antibodies and neutralizing antibodies may develop. Metabolism/Transport Effects None known. Drug Interactions   (For additional information: Launch drug interactions program)   Belimumab: Monoclonal Antibodies may enhance the adverse/toxic effect of Belimumab.  Risk X: Avoid combination Pregnancy Implications Adverse events were not observed in animal reproduction studies. Galcanezumab is a monoclonal antibody; consider the long half-life prior to use in females who are or may become pregnant until information related to pregnancy is available (Tepper 2018). Breast-Feeding Considerations It is not known if galcanezumab is present in breast milk. According to the manufacturer, the decision to breastfeed during therapy should consider the risk of infant exposure, the benefits of breastfeeding to the infant, and benefits of treatment to the mother. Monitoring Parameters Number of monthly migraine days or weekly cluster headache attack frequency Mechanism of Action Galcanezumab is a humanized monoclonal antibody that binds to calcitonin gene-related peptide  (CGRP) ligand and blocks its binding to the receptor. Pharmacodynamics and Pharmacokinetics Distribution: Vd: 7.3 L Metabolism: Expected to be degraded into small peptides and amino acids via catabolic pathways similar to that which is seen with endogenous IgG Half-life elimination: 27 days Time to peak: 5 days Pricing: [place] Solution Auto-injector (Emgality Subcutaneous) 120 mg/mL (per mL): $690.00 Solution Prefilled Syringe (Emgality Subcutaneous) 100 mg/mL (per mL): $575.00 120 mg/mL (per mL): $690.00 Disclaimer: A representative AWP (Average Wholesale Price) price or price range is provided as reference price only. A range is provided when more than one manufacturer's AWP price is available and uses the low and high price reported by the manufacturers to determine the range. The pricing data should be used for benchmarking purposes only, and as such should not be used alone to set or adjudicate any prices for reimbursement or purchasing functions or considered to be an exact price for a single product and/or manufacturer. [employer] expressly disclaims all warranties of any kind or nature, whether express or implied, and assumes no liability with respect to accuracy of price or price range data published in its solutions. In no event shall [employer] be liable for special, indirect, incidental, or consequential damages arising from use of price or price range data. Pricing data is updated monthly. Brand Names: Airline pilot (AT, EE, HR, HU, LT, PT)   For country abbreviations used in [place] (show table) Use of UpToDate is subject to the Subscription and License Agreement.

## 2018-04-25 NOTE — Telephone Encounter (Signed)
Tried calling pt left voicemail

## 2018-04-25 NOTE — Progress Notes (Signed)
[facility]    Provider:  Dr Jaynee Eagles Referring Provider: Pleas Koch, NP Primary Care Physician:  Pleas Koch, NP  CC:  Migraines  HPI:  Faith Jordan is a 22 y.o. female here as a referral from Dr. Carlis Abbott for migraines.  Past medical history angioedema, dysautonomia , dysrhythmia, fibroids, migraines, vasculitis.  Migraines started as a toddler.  Mother is here and also provides information.  Extensive family history of hemiplegic migraines in several first-degree relatives including her mother.  Worsened in the setting of college and became daily. She has been suffering for years. She tried everything, baclofen, tylenol, imipramine and multiple other preventative and acute medications. She has seen multiple neurologists (Dr. Melrose Nakayama and Dr. Domingo Cocking). She saw to Dr. Melrose Nakayama and tried multiple meds and nerve blocks. [facility]. She has hemiplegic migraines, her left side gets weak, then she gets weak eveywhere, her right eye gets blurry and feels like she is drunk. She gets pain behind the eye can be unilateral but either side, can last up to 3 days, pulsating/stabbiong, light sensitivity, nausea, vomiting/dry having, mvement makes it worse. Daily headaches. 10 migraine days a month and ongoing for several years.  Quality, quantity, severity has not changed and there are no vision, gait, motor abnormalities outside of the migraine event.  Can be severe and last 24-72 hours. Also has migraines without aura. No medication overuse. No other focal neurologic deficits, associated symptoms, inciting events or modifiable factors.   Effexor, imipramine, topiramate, propranolol, magnesium, butterbur, baclofen, phenergan, fioricet, imitrex  Reviewed notes, labs and imaging from outside physicians, which showed:   Reviewed notes from Dr. Caryl Comes who referred patient for migraine headaches.  He is seeing her for dysautonomia and palpitations.  Taking  showers at night is helped.  She is continuing to exercise better with recumbent exercises.  Salt and water repletion is ongoing.  She is seeing her gynecologist who will be changing her contraception to every 3 months.  Migraine specialist reluctant to stop imipramine.  She continues to struggle with standing.  CT of the head was normal in 2006 personally reviewed images  Review of Systems: Patient complains of symptoms per HPI as well as the following symptoms: Migraines. Pertinent negatives and positives per HPI. All others negative.   Social History   Socioeconomic History  . Marital status: Single    Spouse name: Not on file  . Number of children: Not on file  . Years of education: Not on file  . Highest education level: Not on file  Occupational History  . Not on file  Social Needs  . Financial resource strain: Not on file  . Food insecurity:    Worry: Not on file    Inability: Not on file  . Transportation needs:    Medical: Not on file    Non-medical: Not on file  Tobacco Use  . Smoking status: Never Smoker  . Smokeless tobacco: Never Used  Substance and Sexual Activity  . Alcohol use: No  . Drug use: No  . Sexual activity: Not on file  Lifestyle  . Physical activity:    Days per week: Not on file    Minutes per session: Not on file  . Stress: Not on file  Relationships  . Social connections:    Talks on phone: Not on file    Gets together: Not on file    Attends religious service: Not on file    Active member  of club or organization: Not on file    Attends meetings of clubs or organizations: Not on file    Relationship status: Not on file  . Intimate partner violence:    Fear of current or ex partner: Not on file    Emotionally abused: Not on file    Physically abused: Not on file    Forced sexual activity: Not on file  Other Topics Concern  . Not on file  Social History Narrative   Student at [employer], [place].    Enjoys reading.   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     Family History  Problem Relation Age of Onset  . Breast cancer Maternal Grandmother 3  . Myasthenia gravis Maternal Grandfather   . Migraines Mother   . Narcolepsy Mother   . Hypothyroidism Mother   . Asthma Other   . Cancer Other   . Epilepsy Other     Past Medical History:  Diagnosis Date  . Acne   . Angioedema   . Aquagenic angio-edema-urticaria   . Asthma    no problems /not used recently  . Dysautonomia ([place])   . Dysrhythmia    sinus tachycardia  . Fibroid    right breast, adenoma  . Headache(784.0)   . Pneumonia    hx  6th grade  . Vasculitis [facility])     Past Surgical History:  Procedure Laterality Date  . BREAST LUMPECTOMY Right   . BREAST LUMPECTOMY Right   . MASS EXCISION Right 10/07/2014   Procedure: EXCISION OF RIGHT BREAST MASS;  Surgeon: Jackolyn Confer, MD;  Location: [facility];  Service: General;  Laterality: Right;  . TONSILLECTOMY AND ADENOIDECTOMY    . TOOTH EXTRACTION      Current Outpatient Medications  Medication Sig Dispense Refill  . ALPRAZolam (XANAX) 0.25 MG tablet Take 0.25 mg by mouth daily as needed for anxiety.    . baclofen (LIORESAL) 10 MG tablet Take 10 mg by mouth daily as needed for muscle spasms (and headaches).    . diphenhydrAMINE (BENADRYL) 25 MG tablet Take 50 mg by mouth daily as needed (Angio Edema). Takes with Zantac    . EPINEPHrine 0.3 mg/0.3 mL IJ SOAJ injection Inject into the muscle once.    . norgestimate-ethinyl estradiol (ORTHO-CYCLEN,SPRINTEC,PREVIFEM) 0.25-35 MG-MCG tablet Take 1 tablet by mouth daily.    . promethazine (PHENERGAN) 25 MG tablet Take 25 mg by mouth as directed.    . propranolol (INDERAL) 20 MG tablet TAKE 1 TABLET (20 MG TOTAL) BY MOUTH DAILY. IN THE AFTERNOON 30 tablet 8  . XOLAIR 150 MG injection every 30 (thirty) days.     . Galcanezumab-gnlm (EMGALITY) 120 MG/ML SOAJ Inject 120 mg into the skin every 30 (thirty) days. 1 pen 11  . Galcanezumab-gnlm (EMGALITY) 120  MG/ML SOAJ Inject 240 mg into the skin once for 1 dose. Then inject 120 mg into the skin every 30 days 4 pen 0   Current Facility-Administered Medications  Medication Dose Route Frequency Provider Last Rate Last Dose  . omalizumab Arvid Right) injection 300 mg  300 mg Subcutaneous Q28 days Valentina Shaggy, MD   300 mg at 04/24/18 9024    Allergies as of 04/25/2018 - Review Complete 04/25/2018  Allergen Reaction Noted  . Advil [ibuprofen] Dermatitis 03/31/2013  . Nsaids Dermatitis 03/31/2013  . Sulfa antibiotics Swelling 11/26/2012    Vitals: BP 121/84   Pulse 86   Ht 5\' 7"  (1.702 m)   Wt 156 lb (  70.8 kg)   BMI 24.43 kg/m  Last Weight:  Wt Readings from Last 1 Encounters:  04/25/18 156 lb (70.8 kg)   Last Height:   Ht Readings from Last 1 Encounters:  04/25/18 5\' 7"  (1.702 m)   Physical exam: Exam: Gen: NAD, conversant, well nourised, well groomed                     CV: RRR, no MRG. No Carotid Bruits. No peripheral edema, warm, nontender Eyes: Conjunctivae clear without exudates or hemorrhage  Neuro: Detailed Neurologic Exam  Speech:    Speech is normal; fluent and spontaneous with normal comprehension.  Cognition:    The patient is oriented to person, place, and time;     recent and remote memory intact;     language fluent;     normal attention, concentration,     fund of knowledge Cranial Nerves:    The pupils are equal, round, and reactive to light. The fundi are normal and spontaneous venous pulsations are present. Visual fields are full to finger confrontation. Extraocular movements are intact. Trigeminal sensation is intact and the muscles of mastication are normal. The face is symmetric. The palate elevates in the midline. Hearing intact. Voice is normal. Shoulder shrug is normal. The tongue has normal motion without fasciculations.   Coordination:    Normal finger to nose and heel to shin. Normal rapid alternating movements.   Gait:    Heel-toe and  tandem gait are normal.   Motor Observation:    No asymmetry, no atrophy, and no involuntary movements noted. Tone:    Normal muscle tone.    Posture:    Posture is normal. normal erect    Strength:    Strength is V/V in the upper and lower limbs.      Sensation: intact to LT     Reflex Exam:  DTR's:    Deep tendon reflexes in the upper and lower extremities are normal bilaterally.   Toes:    The toes are downgoing bilaterally.   Clonus:    Clonus is absent.       Assessment/Plan: This is a 22 year old patient with chronic intractable migraines and hemiplegic migraines with a significant family history.  She is failed multiple preventative and acute management medications.  Mother was started on a cgrp med Emgality and is significantly improved, this is what patient would like to try as well.  We will start patient on the same, discussed side effects, do not get pregnant.  Discussed that this is a biologic, she is already on another biologic however there are no contraindications or interactions with other medications per labeling of this medication and Biologics are very specific I do think that this would be fine but I recommend her also discuss with her physician that prescribes Xolair possibly take them 2 weeks apart.  Discussed: To prevent or relieve headaches, try the following: Cool Compress. Lie down and place a cool compress on your head.  Avoid headache triggers. If certain foods or odors seem to have triggered your migraines in the past, avoid them. A headache diary might help you identify triggers.  Include physical activity in your daily routine. Try a daily walk or other moderate aerobic exercise.  Manage stress. Find healthy ways to cope with the stressors, such as delegating tasks on your to-do list.  Practice relaxation techniques. Try deep breathing, yoga, massage and visualization.  Eat regularly. Eating regularly scheduled meals and maintaining a healthy diet  might help prevent headaches. Also, drink plenty of fluids.  Follow a regular sleep schedule. Sleep deprivation might contribute to headaches Consider biofeedback. With this mind-body technique, you learn to control certain bodily functions - such as muscle tension, heart rate and blood pressure - to prevent headaches or reduce headache pain.    Proceed to emergency room if you experience new or worsening symptoms or symptoms do not resolve, if you have new neurologic symptoms or if headache is severe, or for any concerning symptom.   Provided education and documentation from American headache Society toolbox including articles on: chronic migraine medication overuse headache, chronic migraines, prevention of migraines, behavioral and other nonpharmacologic treatments for headache.  Discussed:  There is increased risk for stroke in women with migraine with aura and a  Contraindication for the combined contraceptive pill for use by women who have migraine with aura, which is in line with World Health Organisation recommendations. The risk for women with migraine without aura is lower and other risk factors like smoking are far more likely to increase stroke risk than migraine. There is a recommendation for no smoking and for the use of low estrogen or progestogen only pills particularly for women with migraine with aura. It is important however that women with migraine who are taking the pill do not decide to suddenly stop taking it without discussing this with their doctor. Please discuss with her OB/GYN.  Meds ordered this encounter  Medications  . Galcanezumab-gnlm (EMGALITY) 120 MG/ML SOAJ    Sig: Inject 120 mg into the skin every 30 (thirty) days.    Dispense:  1 pen    Refill:  11  . Galcanezumab-gnlm (EMGALITY) 120 MG/ML SOAJ    Sig: Inject 240 mg into the skin once for 1 dose. Then inject 120 mg into the skin every 30 days    Dispense:  4 pen    Refill:  0    LOT: [id number] EXP: 12/20     Cc: Pleas Koch, NP  Sarina Ill, MD  [facility] [address] [place], [place] [address]  Phone [phone] Fax [phone]

## 2018-04-26 ENCOUNTER — Ambulatory Visit: Payer: Self-pay

## 2018-04-26 NOTE — Telephone Encounter (Signed)
Patient called and when Faith Jordan was transferring call to me patient was no longer on phone.

## 2018-04-26 NOTE — Telephone Encounter (Signed)
Called and left a message informing patient to call office, that we were following up from her visit Monday.

## 2018-04-27 ENCOUNTER — Encounter: Payer: Self-pay | Admitting: Primary Care

## 2018-04-27 ENCOUNTER — Ambulatory Visit (INDEPENDENT_AMBULATORY_CARE_PROVIDER_SITE_OTHER): Payer: BLUE CROSS/BLUE SHIELD | Admitting: Primary Care

## 2018-04-27 VITALS — BP 100/58 | HR 69 | Temp 98.3°F | Ht 67.0 in | Wt 154.8 lb

## 2018-04-27 DIAGNOSIS — G901 Familial dysautonomia [Riley-Day]: Secondary | ICD-10-CM

## 2018-04-27 DIAGNOSIS — G43711 Chronic migraine without aura, intractable, with status migrainosus: Secondary | ICD-10-CM | POA: Diagnosis not present

## 2018-04-27 DIAGNOSIS — Z0000 Encounter for general adult medical examination without abnormal findings: Secondary | ICD-10-CM

## 2018-04-27 DIAGNOSIS — R002 Palpitations: Secondary | ICD-10-CM

## 2018-04-27 DIAGNOSIS — R55 Syncope and collapse: Secondary | ICD-10-CM

## 2018-04-27 DIAGNOSIS — D649 Anemia, unspecified: Secondary | ICD-10-CM

## 2018-04-27 LAB — LIPID PANEL
Cholesterol: 200 mg/dL (ref 0–200)
HDL: 57.8 mg/dL (ref >39.00)
LDL Cholesterol: 111 mg/dL — ABNORMAL HIGH (ref 0–99)
NonHDL: 142.02
Total CHOL/HDL Ratio: 3
Triglycerides: 153 mg/dL — ABNORMAL HIGH (ref 0.0–149.0)
VLDL: 30.6 mg/dL (ref 0.0–40.0)

## 2018-04-27 LAB — CBC
HCT: 36.8 % (ref 36.0–46.0)
Hemoglobin: 11.9 g/dL — ABNORMAL LOW (ref 12.0–15.0)
MCHC: 32.4 g/dL (ref 30.0–36.0)
MCV: 75 fl — ABNORMAL LOW (ref 78.0–100.0)
Platelets: 313 10*3/uL (ref 150.0–400.0)
RBC: 4.91 Mil/uL (ref 3.87–5.11)
RDW: 15.4 % (ref 11.5–15.5)
WBC: 7.4 10*3/uL (ref 4.0–10.5)

## 2018-04-27 LAB — COMPREHENSIVE METABOLIC PANEL
ALT: 12 U/L (ref 0–35)
AST: 16 U/L (ref 0–37)
Albumin: 4 g/dL (ref 3.5–5.2)
Alkaline Phosphatase: 60 U/L (ref 39–117)
BUN: 8 mg/dL (ref 6–23)
CO2: 26 mEq/L (ref 19–32)
Calcium: 9.1 mg/dL (ref 8.4–10.5)
Chloride: 105 mEq/L (ref 96–112)
Creatinine, Ser: 0.68 mg/dL (ref 0.40–1.20)
GFR: 114.65 mL/min (ref >60.00)
Glucose, Bld: 98 mg/dL (ref 70–99)
Potassium: 4 mEq/L (ref 3.5–5.1)
Sodium: 138 mEq/L (ref 135–145)
Total Bilirubin: 0.4 mg/dL (ref 0.2–1.2)
Total Protein: 7.1 g/dL (ref 6.0–8.3)

## 2018-04-27 LAB — TSH: TSH: 1.32 u[IU]/mL (ref 0.35–4.50)

## 2018-04-27 NOTE — Assessment & Plan Note (Signed)
Following with cardiology, continues to take beta blockers. No recent use of alprazolam.

## 2018-04-27 NOTE — Assessment & Plan Note (Signed)
Repeat CBC pending. 

## 2018-04-27 NOTE — Assessment & Plan Note (Signed)
Continues and is compliant to beta blocker. Following with cardiology. Rate and rhythm regular today.

## 2018-04-27 NOTE — Assessment & Plan Note (Signed)
Immunizations UTD. Pap smear UTD. Recommended to work on exercise gradually as tolerated.  Work on diet, increase vegetables/fruit. Exam unremarkable. Labs pending. Follow up in 1 year for CPE.

## 2018-04-27 NOTE — Progress Notes (Signed)
Subjective:    Patient ID: Faith Jordan, female    DOB: June 11, 1996, 22 y.o.   MRN: [id number]  HPI  Ms. Faith Jordan is a 22 year old female who presents today for complete physical.  She has noticed intermittent right pelvic sharp pains with sneezing, coughing, and with certain movements in bed laying down (turing from side to side in bed). This has been present for 3-4 months. She denies heavy menstrual cycles, vomiting, fevers, abdominal pain, vaginal discharge. She does have a family history of endometriosis in her mother. She plans on notifying her GYN regarding her symptoms.   Immunizations: -Tetanus: Completed in 2015 -Influenza: Completed last season   Diet: She endorses a healthy diet Breakfast: Eggs, toast, protein shakes Lunch: Protein shake, fast food, salad Dinner: Fast food, salads, protein shake Snacks: Chips, fruit, granola bar Desserts: None Beverages: Water, Gatorade, protein shakes  Exercise: She is not exercising, does have anxiety about potentially passing out.  Eye exam: Completed in Fall 2018 Dental exam: Completes semi-annually  Pap Smear: Completed in March 2019   Review of Systems  Constitutional: Negative for unexpected weight change.  HENT: Negative for rhinorrhea.   Respiratory: Negative for cough and shortness of breath.   Cardiovascular: Positive for palpitations. Negative for chest pain.  Gastrointestinal: Negative for constipation and diarrhea.  Genitourinary: Negative for difficulty urinating and menstrual problem.  Musculoskeletal: Negative for arthralgias and myalgias.  Skin: Negative for rash.  Allergic/Immunologic: Negative for environmental allergies.  Neurological: Negative for dizziness and numbness.       Recent syncope, chronic migraines  Psychiatric/Behavioral:       Some anxiety, feels that she manages well on her own       Past Medical History:  Diagnosis Date  . Acne   . Angioedema   . Aquagenic angio-edema-urticaria     . Asthma    no problems /not used recently  . Dysautonomia ([place])   . Dysrhythmia    sinus tachycardia  . Fibroid    right breast, adenoma  . Headache(784.0)   . Pneumonia    hx  6th grade  . Vasculitis ([place])      Social History   Socioeconomic History  . Marital status: Single    Spouse name: Not on file  . Number of children: Not on file  . Years of education: Not on file  . Highest education level: Not on file  Occupational History  . Not on file  Social Needs  . Financial resource strain: Not on file  . Food insecurity:    Worry: Not on file    Inability: Not on file  . Transportation needs:    Medical: Not on file    Non-medical: Not on file  Tobacco Use  . Smoking status: Never Smoker  . Smokeless tobacco: Never Used  Substance and Sexual Activity  . Alcohol use: No  . Drug use: No  . Sexual activity: Not on file  Lifestyle  . Physical activity:    Days per week: Not on file    Minutes per session: Not on file  . Stress: Not on file  Relationships  . Social connections:    Talks on phone: Not on file    Gets together: Not on file    Attends religious service: Not on file    Active member of club or organization: Not on file    Attends meetings of clubs or organizations: Not on file    Relationship status: Not on  file  . Intimate partner violence:    Fear of current or ex partner: Not on file    Emotionally abused: Not on file    Physically abused: Not on file    Forced sexual activity: Not on file  Other Topics Concern  . Not on file  Social History Narrative   Student at [employer], [place].    Enjoys reading.   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     Past Surgical History:  Procedure Laterality Date  . BREAST LUMPECTOMY Right   . BREAST LUMPECTOMY Right   . MASS EXCISION Right 10/07/2014   Procedure: EXCISION OF RIGHT BREAST MASS;  Surgeon: Jackolyn Confer, MD;  Location: [facility];  Service: General;  Laterality: Right;  .  TONSILLECTOMY AND ADENOIDECTOMY    . TOOTH EXTRACTION      Family History  Problem Relation Age of Onset  . Breast cancer Maternal Grandmother 60  . Myasthenia gravis Maternal Grandfather   . Migraines Mother   . Narcolepsy Mother   . Hypothyroidism Mother   . Asthma Other   . Cancer Other   . Epilepsy Other     Allergies  Allergen Reactions  . 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 Nsaids Dermatitis    Reaction similar to Stephens-Johnson, blistering peeling rash  . Sulfa Antibiotics Swelling    Facial swelling    Current Outpatient Medications on File Prior to Visit  Medication Sig Dispense Refill  . ALPRAZolam (XANAX) 0.25 MG tablet Take 0.25 mg by mouth daily as needed for anxiety.    . baclofen (LIORESAL) 10 MG tablet Take 10 mg by mouth daily as needed for muscle spasms (and headaches).    . diphenhydrAMINE (BENADRYL) 25 MG tablet Take 50 mg by mouth daily as needed (Angio Edema). Takes with Zantac    . EPINEPHrine 0.3 mg/0.3 mL IJ SOAJ injection Inject into the muscle once.    . Galcanezumab-gnlm (EMGALITY) 120 MG/ML SOAJ Inject 120 mg into the skin every 30 (thirty) days. 1 pen 11  . norgestimate-ethinyl estradiol (ORTHO-CYCLEN,SPRINTEC,PREVIFEM) 0.25-35 MG-MCG tablet Take 1 tablet by mouth daily.    . promethazine (PHENERGAN) 25 MG tablet Take 25 mg by mouth as directed.    . propranolol (INDERAL) 20 MG tablet TAKE 1 TABLET (20 MG TOTAL) BY MOUTH DAILY. IN THE AFTERNOON 30 tablet 8  . XOLAIR 150 MG injection every 30 (thirty) days.      Current Facility-Administered Medications on File Prior to Visit  Medication Dose Route Frequency Provider Last Rate Last Dose  . omalizumab Arvid Right) injection 300 mg  300 mg Subcutaneous Q28 days Valentina Shaggy, MD   300 mg at 04/24/18 0859    BP (!) 100/58   Pulse 69   Temp 98.3 F (36.8 C) (Oral)   Ht 5\' 7"  (1.702 m)   Wt 154 lb 12 oz (70.2 kg)   LMP 04/23/2018    SpO2 99%   BMI 24.24 kg/m    Objective:   Physical Exam  Constitutional: She is oriented to person, place, and time. She appears well-nourished.  HENT:  Mouth/Throat: No oropharyngeal exudate.  Eyes: Pupils are equal, round, and reactive to light. EOM are normal.  Neck: Neck supple. No thyromegaly present.  Cardiovascular: Normal rate and regular rhythm.  Respiratory: Effort normal and breath sounds normal.  GI: Soft. Bowel sounds are normal. There is no tenderness.  Musculoskeletal: Normal range of motion.  Neurological: She is alert and oriented to person, place, and time.  Skin: Skin is warm and dry.  Psychiatric: She has a normal mood and affect.           Assessment & Plan:

## 2018-04-27 NOTE — Assessment & Plan Note (Signed)
Following with neurology, on Emgality as of recent. Continue current regimen.

## 2018-04-27 NOTE — Assessment & Plan Note (Addendum)
Has had syncopal episodes recently, allergist thinks this is due to the allergy shots. Allergist will be getting in touch with cardiology. Exam today unremarkable.

## 2018-04-27 NOTE — Patient Instructions (Signed)
Stop by the lab prior to leaving today. I will notify you of your results once received.   Start exercising. You should be getting 150 minutes of moderate intensity exercise weekly.  Increase consumption of vegetables, fruit, whole grains, lean protein.  Ensure you are consuming 64 ounces of water daily.  Please notify your GYN regarding your symptoms as you will likely need an ultrasound.   It was a pleasure to see you today!   Preventive Care 18-39 Years, Female Preventive care refers to lifestyle choices and visits with your health care provider that can promote health and wellness. What does preventive care include?  A yearly physical exam. This is also called an annual well check.  Dental exams once or twice a year.  Routine eye exams. Ask your health care provider how often you should have your eyes checked.  Personal lifestyle choices, including: ? Daily care of your teeth and gums. ? Regular physical activity. ? Eating a healthy diet. ? Avoiding tobacco and drug use. ? Limiting alcohol use. ? Practicing safe sex. ? Taking vitamin and mineral supplements as recommended by your health care provider. What happens during an annual well check? The services and screenings done by your health care provider during your annual well check will depend on your age, overall health, lifestyle risk factors, and family history of disease. Counseling Your health care provider may ask you questions about your:  Alcohol use.  Tobacco use.  Drug use.  Emotional well-being.  Home and relationship well-being.  Sexual activity.  Eating habits.  Work and work Statistician.  Method of birth control.  Menstrual cycle.  Pregnancy history.  Screening You may have the following tests or measurements:  Height, weight, and BMI.  Diabetes screening. This is done by checking your blood sugar (glucose) after you have not eaten for a while (fasting).  Blood pressure.  Lipid and  cholesterol levels. These may be checked every 5 years starting at age 46.  Skin check.  Hepatitis C blood test.  Hepatitis B blood test.  Sexually transmitted disease (STD) testing.  BRCA-related cancer screening. This may be done if you have a family history of breast, ovarian, tubal, or peritoneal cancers.  Pelvic exam and Pap test. This may be done every 3 years starting at age 102. Starting at age 69, this may be done every 5 years if you have a Pap test in combination with an HPV test.  Discuss your test results, treatment options, and if necessary, the need for more tests with your health care provider. Vaccines Your health care provider may recommend certain vaccines, such as:  Influenza vaccine. This is recommended every year.  Tetanus, diphtheria, and acellular pertussis (Tdap, Td) vaccine. You may need a Td booster every 10 years.  Varicella vaccine. You may need this if you have not been vaccinated.  HPV vaccine. If you are 74 or younger, you may need three doses over 6 months.  Measles, mumps, and rubella (MMR) vaccine. You may need at least one dose of MMR. You may also need a second dose.  Pneumococcal 13-valent conjugate (PCV13) vaccine. You may need this if you have certain conditions and were not previously vaccinated.  Pneumococcal polysaccharide (PPSV23) vaccine. You may need one or two doses if you smoke cigarettes or if you have certain conditions.  Meningococcal vaccine. One dose is recommended if you are age 55-21 years and a first-year college student living in a residence hall, or if you have one of several  medical conditions. You may also need additional booster doses.  Hepatitis A vaccine. You may need this if you have certain conditions or if you travel or work in places where you may be exposed to hepatitis A.  Hepatitis B vaccine. You may need this if you have certain conditions or if you travel or work in places where you may be exposed to hepatitis  B.  Haemophilus influenzae type b (Hib) vaccine. You may need this if you have certain risk factors.  Talk to your health care provider about which screenings and vaccines you need and how often you need them. This information is not intended to replace advice given to you by your health care provider. Make sure you discuss any questions you have with your health care provider. Document Released: 11/30/2001 Document Revised: 06/23/2016 Document Reviewed: 08/05/2015 Elsevier Interactive Patient Education  [employer].

## 2018-04-28 NOTE — Telephone Encounter (Signed)
I called patient and left message advising to call back with update to [facility] or [place] office.

## 2018-04-28 NOTE — Telephone Encounter (Signed)
Patient called back and I spoke with her she stated she is feeling fine. She was a little tired after her reaction but has been doing good since.

## 2018-05-22 ENCOUNTER — Ambulatory Visit: Payer: Self-pay

## 2018-05-24 ENCOUNTER — Ambulatory Visit: Payer: BLUE CROSS/BLUE SHIELD | Admitting: Allergy & Immunology

## 2018-05-24 ENCOUNTER — Ambulatory Visit: Payer: BLUE CROSS/BLUE SHIELD

## 2018-06-19 ENCOUNTER — Other Ambulatory Visit: Payer: Self-pay | Admitting: Neurology

## 2018-06-19 MED ORDER — GALCANEZUMAB-GNLM 120 MG/ML ~~LOC~~ SOAJ
120.0000 mg | SUBCUTANEOUS | 11 refills | Status: DC
Start: 2018-06-19 — End: 2019-06-27

## 2018-06-22 ENCOUNTER — Ambulatory Visit (INDEPENDENT_AMBULATORY_CARE_PROVIDER_SITE_OTHER): Payer: BLUE CROSS/BLUE SHIELD | Admitting: *Deleted

## 2018-06-22 DIAGNOSIS — L500 Allergic urticaria: Secondary | ICD-10-CM | POA: Diagnosis not present

## 2018-07-17 ENCOUNTER — Ambulatory Visit (INDEPENDENT_AMBULATORY_CARE_PROVIDER_SITE_OTHER): Payer: BLUE CROSS/BLUE SHIELD | Admitting: Neurology

## 2018-07-17 ENCOUNTER — Telehealth: Payer: Self-pay | Admitting: Neurology

## 2018-07-17 ENCOUNTER — Other Ambulatory Visit: Payer: Self-pay | Admitting: Neurology

## 2018-07-17 VITALS — BP 129/86 | HR 72

## 2018-07-17 DIAGNOSIS — G43011 Migraine without aura, intractable, with status migrainosus: Secondary | ICD-10-CM

## 2018-07-17 DIAGNOSIS — R519 Headache, unspecified: Secondary | ICD-10-CM | POA: Insufficient documentation

## 2018-07-17 DIAGNOSIS — R51 Headache: Secondary | ICD-10-CM | POA: Diagnosis not present

## 2018-07-17 MED ORDER — RIZATRIPTAN BENZOATE 10 MG PO TBDP: 10.0000 mg | ORAL_TABLET | ORAL | 11 refills | Status: DC | PRN

## 2018-07-17 MED ORDER — METHYLPREDNISOLONE 4 MG PO TBPK: ORAL_TABLET | ORAL | 1 refills | Status: DC

## 2018-07-17 NOTE — Telephone Encounter (Signed)
Spoke with patient. She stated she has two specific places on her head around her eyebrow that will not stop hurting. She would like a nerve block. She will start her steroid dose pack. Pt offered appt today. She will come in at 10:00 for the nerve block. She verbalized appreciation.

## 2018-07-17 NOTE — Progress Notes (Signed)
Tried imitrex,  Had side effects will try maxalt at onset of headache. This on estarted a week ago refractory to mesdcations. May be due to her menses, at next menses may try to pre-treat starting 24 hours beforehand.  Performed by Dr. Jaynee Eagles M.D. All procedures a documented blood were medically necessary, reasonable and appropriate based on the patient's history, medical diagnosis and physician opinion. Verbal informed consent was obtained from the patient, patient was informed of potential risk of procedure, including bruising, bleeding, hematoma formation, infection, muscle weakness, muscle pain, numbness, transient hypertension, transient hyperglycemia and transient insomnia among others. All areas injected were topically clean with isopropyl rubbing alcohol. Nonsterile nonlatex gloves were worn during the procedure.  Supraorbital nerve block (64400): Supraorbital nerve site was identified along the incision of the frontal bone on the orbital/supraorbital ridge. Medication was injected into the left and right supraorbital nerve areas. Patient's condition is associated with inflammation of the supraorbital and associated muscle groups. Injection was deemed medically necessary, reasonable and appropriate. Injection represents a separate and unique surgical service.

## 2018-07-17 NOTE — Patient Instructions (Addendum)
Rizatriptan: Please take one tablet at the onset of your headache. If it does not improve the symptoms please take one additional tablet. Do not take more then 2 tablets in 24hrs. Do not take use more then 2 to 3 times in a week. May take with alleve or ibuprofen or tylenol or phenergan. At next menses try to pre-treat, start will alleve twice daily 24 hours prior to onset and take for several days. May also take Maxalt during this time.    Rizatriptan disintegrating tablets What is this medicine? RIZATRIPTAN (rye za TRIP tan) is used to treat migraines with or without aura. An aura is a strange feeling or visual disturbance that warns you of an attack. It is not used to prevent migraines. This medicine may be used for other purposes; ask your health care provider or pharmacist if you have questions. COMMON BRAND NAME(S): Maxalt-MLT What should I tell my health care provider before I take this medicine? They need to know if you have any of these conditions: -bowel disease or colitis -diabetes -family history of heart disease -fast or irregular heart beat -heart or blood vessel disease, angina (chest pain), or previous heart attack -high blood pressure -high cholesterol -history of stroke, transient ischemic attacks (TIAs or mini-strokes), or intracranial bleeding -kidney or liver disease -overweight -poor circulation -postmenopausal or surgical removal of uterus and ovaries -an unusual or allergic reaction to rizatriptan, other medicines, foods, dyes, or preservatives -pregnant or trying to get pregnant -breast-feeding How should I use this medicine? Take this medicine by mouth. Follow the directions on the prescription label. This medicine is taken at the first symptoms of a migraine. It is not for everyday use. Leave the tablet in the foil package until you are ready to take it. Do not push the tablet through the blister pack. Peel open the blister pack with dry hands and place the  tablet on your tongue. The tablet will dissolve rapidly and be swallowed in your saliva. It is not necessary to drink any water to take this medicine. If your migraine headache returns after one dose, you can take another dose as directed. You must leave at least 2 hours between doses, and do not take more than 30 mg total in 24 hours. If there is no improvement at all after the first dose, do not take a second dose without talking to your doctor or health care professional. Do not take your medicine more often than directed. Talk to your pediatrician regarding the use of this medicine in children. While this drug may be prescribed for children as young as 6 years for selected conditions, precautions do apply. Overdosage: If you think you have taken too much of this medicine contact a poison control center or emergency room at once. NOTE: This medicine is only for you. Do not share this medicine with others. What if I miss a dose? This does not apply; this medicine is not for regular use. What may interact with this medicine? Do not take this medicine with any of the following medicines: -amphetamine, dextroamphetamine or cocaine -dihydroergotamine, ergotamine, ergoloid mesylates, methysergide, or ergot-type medication - do not take within 24 hours of taking rizatriptan -feverfew -MAOIs like Carbex, Eldepryl, Marplan, Nardil, and Parnate - do not take rizatriptan within 2 weeks of stopping MAOI therapy. -other migraine medicines like almotriptan, eletriptan, naratriptan, sumatriptan, zolmitriptan - do not take within 24 hours of taking rizatriptan -tryptophan This medicine may also interact with the following medications: -medicines for mental depression,  anxiety or mood problems -propranolol This list may not describe all possible interactions. Give your health care provider a list of all the medicines, herbs, non-prescription drugs, or dietary supplements you use. Also tell them if you smoke,  drink alcohol, or use illegal drugs. Some items may interact with your medicine. What should I watch for while using this medicine? Only take this medicine for a migraine headache. Take it if you get warning symptoms or at the start of a migraine attack. It is not for regular use to prevent migraine attacks. You may get drowsy or dizzy. Do not drive, use machinery, or do anything that needs mental alertness until you know how this medicine affects you. To reduce dizzy or fainting spells, do not sit or stand up quickly, especially if you are an older patient. Alcohol can increase drowsiness, dizziness and flushing. Avoid alcoholic drinks. Smoking cigarettes may increase the risk of heart-related side effects from using this medicine. If you take migraine medicines for 10 or more days a month, your migraines may get worse. Keep a diary of headache days and medicine use. Contact your healthcare professional if your migraine attacks occur more frequently. What side effects may I notice from receiving this medicine? Side effects that you should report to your doctor or health care professional as soon as possible: -allergic reactions like skin rash, itching or hives, swelling of the face, lips, or tongue -fast, slow, or irregular heart beat -increased or decreased blood pressure -seizures -severe stomach pain and cramping, bloody diarrhea -signs and symptoms of a blood clot such as breathing problems; changes in vision; chest pain; severe, sudden headache; pain, swelling, warmth in the leg; trouble speaking; sudden numbness or weakness of the face, arm or leg -tingling, pain, or numbness in the face, hands, or feet Side effects that usually do not require medical attention (report to your doctor or health care professional if they continue or are bothersome): -drowsiness -dry mouth -feeling warm, flushing, or redness of the face -headache -muscle cramps, pain -nausea, vomiting -unusually weak or  tired This list may not describe all possible side effects. Call your doctor for medical advice about side effects. You may report side effects to FDA at 1-800-FDA-1088. Where should I keep my medicine? Keep out of the reach of children. Store at room temperature between 15 and 30 degrees C (59 and 86 degrees F). Protect from light and moisture. Throw away any unused medicine after the expiration date. NOTE: This sheet is a summary. It may not cover all possible information. If you have questions about this medicine, talk to your doctor, pharmacist, or health care provider.  2018 Elsevier/Gold Standard (2013-06-05 10:17:42)

## 2018-07-17 NOTE — Progress Notes (Signed)
Nerve block w/o steroid: Pt signed consent  0.5% Bupivocaine 6 mL total LOT: [id number] EXP: 06/2021 NDC: 06386-854-88  & LOT: [id number] EXP: 03/18/2020  2% Lidocaine 6 mL LOT: [id number] EXP: 11/19/2019 NDC: 7331-2508-71

## 2018-07-17 NOTE — Telephone Encounter (Signed)
Patient calling to schedule a nerve block as discussed with Dr. Jaynee Eagles.

## 2018-07-20 ENCOUNTER — Ambulatory Visit (INDEPENDENT_AMBULATORY_CARE_PROVIDER_SITE_OTHER): Payer: BLUE CROSS/BLUE SHIELD | Admitting: *Deleted

## 2018-07-20 DIAGNOSIS — L500 Allergic urticaria: Secondary | ICD-10-CM

## 2018-08-17 ENCOUNTER — Ambulatory Visit (INDEPENDENT_AMBULATORY_CARE_PROVIDER_SITE_OTHER): Payer: BLUE CROSS/BLUE SHIELD | Admitting: *Deleted

## 2018-08-17 DIAGNOSIS — L500 Allergic urticaria: Secondary | ICD-10-CM

## 2018-09-13 ENCOUNTER — Other Ambulatory Visit: Payer: Self-pay | Admitting: Allergy & Immunology

## 2018-09-13 ENCOUNTER — Ambulatory Visit (INDEPENDENT_AMBULATORY_CARE_PROVIDER_SITE_OTHER): Payer: BLUE CROSS/BLUE SHIELD | Admitting: *Deleted

## 2018-09-13 DIAGNOSIS — L500 Allergic urticaria: Secondary | ICD-10-CM

## 2018-09-18 ENCOUNTER — Telehealth: Payer: Self-pay

## 2018-09-18 NOTE — Telephone Encounter (Signed)
Called patient. No answer.  Unable to leave a message.  

## 2018-09-18 NOTE — Telephone Encounter (Signed)
Spoke with patient.  She stated that Saturday she had an episode of extreme fatigue and was unable to catch her breath. She stated that it was so bad that her family called 911 and once they arrived she received Albuterol and Epi. Patient is concerned and wants to know if she needs to continue Xolair because she has never had a reaction to anything else in the past.   Please advise.

## 2018-09-18 NOTE — Telephone Encounter (Signed)
Patient received a Xolair injection last wed. She had to use her epi pen on Saturday. Patient is wondering could she have had a delayed reaction to Xolair. She wanted to make sure everything was okay before her next Xolair.   Please Advise

## 2018-09-19 NOTE — Telephone Encounter (Signed)
I have never heard of a reaction occurring that long from the Xolair administration. Maybe this is related to her POTS/dysautonomia diagnosis. I would recommend continuing the Xolair, but if she prefers to stop it that is fine as well.  Salvatore Marvel, MD [facility]

## 2018-09-19 NOTE — Telephone Encounter (Signed)
Left a message for pt to call back concerning her Xolair.

## 2018-09-19 NOTE — Telephone Encounter (Signed)
Advised patient of Dr Gillermina Hu response and she would like to continue her Xolair injections

## 2018-10-13 ENCOUNTER — Ambulatory Visit (INDEPENDENT_AMBULATORY_CARE_PROVIDER_SITE_OTHER): Payer: BLUE CROSS/BLUE SHIELD

## 2018-10-13 DIAGNOSIS — L500 Allergic urticaria: Secondary | ICD-10-CM

## 2018-11-08 ENCOUNTER — Ambulatory Visit (INDEPENDENT_AMBULATORY_CARE_PROVIDER_SITE_OTHER): Payer: BLUE CROSS/BLUE SHIELD

## 2018-11-08 DIAGNOSIS — L500 Allergic urticaria: Secondary | ICD-10-CM | POA: Diagnosis not present

## 2018-11-09 ENCOUNTER — Ambulatory Visit: Payer: BLUE CROSS/BLUE SHIELD | Admitting: Adult Health

## 2018-11-09 ENCOUNTER — Ambulatory Visit: Payer: Self-pay

## 2018-11-09 ENCOUNTER — Telehealth: Payer: Self-pay | Admitting: *Deleted

## 2018-11-09 NOTE — Telephone Encounter (Signed)
Called patient and informed her the NP is sick, need to reschedule. Rescheduled her with Leandrew Koyanagi, NP for Dr Jaynee Eagles and put her on wait list at her request. She verbalized understanding.

## 2018-11-16 ENCOUNTER — Other Ambulatory Visit: Payer: Self-pay | Admitting: Adult Health

## 2018-11-17 NOTE — Telephone Encounter (Signed)
Rx(s) sent to pharmacy electronically.  

## 2018-11-22 NOTE — Progress Notes (Signed)
PATIENT: Faith Jordan DOB: 1996-05-11  REASON FOR VISIT: follow up HISTORY FROM: patient  Chief Complaint  Patient presents with  . Migraines    Hallway room. St. supraorbital nerve block given at last ov helped for the rest of that day. Sts. Medrol dose pack did not help. Rizatriptan not very effective. Would like to discuss other tx options/fim     HISTORY OF PRESENT ILLNESS: Today 11/29/18 Faith Jordan is a 23 y.o. female here today for follow up. She reports significant relief in daily headaches since starting Emgality. She does continue to have breakthrough headaches, specifically around her menstrual cycle. She reports that rizatriptan sometimes helps and sometimes does not. It makes her feel really sleepy at night as well. She is taking Tylenol 1000mg  then four hours she will take Aleve 220mg . She is having to do this 1-2 times a week. She denies adverse effects from [employer].    HISTORY: (copied from Dr Janeann Merl note on 04/25/2018) HPI:  Faith Jordan is a 23 y.o. female here as a referral from Dr. Carlis Abbott for migraines.  Past medical history angioedema, dysautonomia , dysrhythmia, fibroids, migraines, vasculitis. Migraines started as a toddler.  Mother is here and also provides information.  Extensive family history of hemiplegic migraines in several first-degree relatives including her mother.  Worsened in the setting of college and became daily. She has been suffering for years. She tried everything, baclofen, tylenol, imipramine and multiple other preventative and acute medications. She has seen multiple neurologists (Dr. Melrose Nakayama and Dr. Domingo Cocking). She saw to Dr. Melrose Nakayama and tried multiple meds and nerve blocks. [facility]. She has hemiplegic migraines, her left side gets weak, then she gets weak eveywhere, her right eye gets blurry and feels like she is drunk. She gets pain behind the eye can be unilateral but either side, can last up to 3 days,  pulsating/stabbiong, light sensitivity, nausea, vomiting/dry having, mvement makes it worse. Daily headaches. 10 migraine days a month and ongoing for several years.  Quality, quantity, severity has not changed and there are no vision, gait, motor abnormalities outside of the migraine event.  Can be severe and last 24-72 hours. Also has migraines without aura. No medication overuse. No other focal neurologic deficits, associated symptoms, inciting events or modifiable factors.   Effexor, imipramine, topiramate, propranolol, magnesium, butterbur, baclofen, phenergan, fioricet, imitrex  REVIEW OF SYSTEMS: Out of a complete 14 system review of symptoms, the patient complains only of the following symptoms, headaches and all other reviewed systems are negative.  ALLERGIES: Allergies  Allergen Reactions  . 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 Nsaids Dermatitis    Reaction similar to Stephens-Johnson, blistering peeling rash  . Sulfa Antibiotics Swelling    Facial swelling    HOME MEDICATIONS: Outpatient Medications Prior to Visit  Medication Sig Dispense Refill  . ALPRAZolam (XANAX) 0.25 MG tablet Take 0.25 mg by mouth daily as needed for anxiety.    . baclofen (LIORESAL) 10 MG tablet Take 10 mg by mouth daily as needed for muscle spasms (and headaches).    . diphenhydrAMINE (BENADRYL) 25 MG tablet Take 50 mg by mouth daily as needed (Angio Edema). Takes with Zantac    . EPINEPHrine 0.3 mg/0.3 mL IJ SOAJ injection Inject into the muscle once.    . Galcanezumab-gnlm (EMGALITY) 120 MG/ML SOAJ Inject 120 mg into the skin every 30 (thirty) days. 1 pen 11  .  norgestimate-ethinyl estradiol (ORTHO-CYCLEN,SPRINTEC,PREVIFEM) 0.25-35 MG-MCG tablet Take 1 tablet by mouth daily.    . promethazine (PHENERGAN) 25 MG tablet Take 25 mg by mouth as directed.    . propranolol (INDERAL) 20 MG tablet Take 1 tablet (20 mg total) by mouth daily. NEEDS  APPOINTMENT FOR FUTURE REFILLS 90 tablet 0  . XOLAIR 150 MG injection INJECT 300 MG SUBCUTANEOUSLY EVERY 4 WEEKS. 2 each 10  . methylPREDNISolone (MEDROL DOSEPAK) 4 MG TBPK tablet Take pills all together daily in the morning with food for 6 days. 21 tablet 1  . rizatriptan (MAXALT-MLT) 10 MG disintegrating tablet Take 1 tablet (10 mg total) by mouth as needed for migraine. May repeat in 2 hours. Max 2x/day. May take with nsaids or tylenol. 9 tablet 11   Facility-Administered Medications Prior to Visit  Medication Dose Route Frequency Provider Last Rate Last Dose  . omalizumab Arvid Right) injection 300 mg  300 mg Subcutaneous Q28 days Valentina Shaggy, MD   300 mg at 11/08/18 1341    PAST MEDICAL HISTORY: Past Medical History:  Diagnosis Date  . Acne   . Angioedema   . Aquagenic angio-edema-urticaria   . Asthma    no problems /not used recently  . Dysautonomia ([place])   . Dysrhythmia    sinus tachycardia  . Fibroid    right breast, adenoma  . Headache(784.0)   . Pneumonia    hx  6th grade  . Vasculitis ([place])     PAST SURGICAL HISTORY: Past Surgical History:  Procedure Laterality Date  . BREAST LUMPECTOMY Right   . BREAST LUMPECTOMY Right   . MASS EXCISION Right 10/07/2014   Procedure: EXCISION OF RIGHT BREAST MASS;  Surgeon: Jackolyn Confer, MD;  Location: [facility];  Service: General;  Laterality: Right;  . TONSILLECTOMY AND ADENOIDECTOMY    . TOOTH EXTRACTION      FAMILY HISTORY: Family History  Problem Relation Age of Onset  . Breast cancer Maternal Grandmother 10  . Myasthenia gravis Maternal Grandfather   . Migraines Mother   . Narcolepsy Mother   . Hypothyroidism Mother   . Asthma Other   . Cancer Other   . Epilepsy Other     SOCIAL HISTORY: Social History   Socioeconomic History  . Marital status: Single    Spouse name: Not on file  . Number of children: Not on file  . Years of education: Not on file  . Highest education level: Not on file  Occupational  History  . Not on file  Social Needs  . Financial resource strain: Not on file  . Food insecurity:    Worry: Not on file    Inability: Not on file  . Transportation needs:    Medical: Not on file    Non-medical: Not on file  Tobacco Use  . Smoking status: Never Smoker  . Smokeless tobacco: Never Used  Substance and Sexual Activity  . Alcohol use: No  . Drug use: No  . Sexual activity: Not on file  Lifestyle  . Physical activity:    Days per week: Not on file    Minutes per session: Not on file  . Stress: Not on file  Relationships  . Social connections:    Talks on phone: Not on file    Gets together: Not on file    Attends religious service: Not on file    Active member of club or organization: Not on file    Attends meetings of clubs or organizations: Not on  file    Relationship status: Not on file  . Intimate partner violence:    Fear of current or ex partner: Not on file    Emotionally abused: Not on file    Physically abused: Not on file    Forced sexual activity: Not on file  Other Topics Concern  . Not on file  Social History Narrative   Student at [employer], [place].    Enjoys reading.   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       PHYSICAL EXAM  Vitals:   11/29/18 1254  BP: 136/77  Pulse: 81  Resp: 14  Weight: 154 lb (69.9 kg)  Height: 5\' 7"  (1.702 m)   Body mass index is 24.12 kg/m.  Generalized: Well developed, in no acute distress  Cardiology: normal rate and rhythm, no murmur noted Neurological examination  Mentation: Alert oriented to time, place, history taking. Follows all commands speech and language fluent Cranial nerve II-XII: Pupils were equal round reactive to light. Extraocular movements were full, visual field were full on confrontational test. Facial sensation and strength were normal. Uvula tongue midline. Head turning and shoulder shrug  were normal and symmetric. Motor: The motor testing reveals 5 over 5 strength of all  4 extremities. Good symmetric motor tone is noted throughout.  Sensory: Sensory testing is intact to soft touch on all 4 extremities. No evidence of extinction is noted.  Coordination: Cerebellar testing reveals good finger-nose-finger and heel-to-shin bilaterally.  Gait and station: Gait is normal.   DIAGNOSTIC DATA (LABS, IMAGING, TESTING) - I reviewed patient records, labs, notes, testing and imaging myself where available.  No flowsheet data found.   Lab Results  Component Value Date   WBC 7.4 04/27/2018   HGB 11.9 (L) 04/27/2018   HCT 36.8 04/27/2018   MCV 75.0 (L) 04/27/2018   PLT 313.0 04/27/2018      Component Value Date/Time   NA 138 04/27/2018 1014   NA 142 09/28/2017 1459   K 4.0 04/27/2018 1014   CL 105 04/27/2018 1014   CO2 26 04/27/2018 1014   GLUCOSE 98 04/27/2018 1014   BUN 8 04/27/2018 1014   BUN 3 (L) 09/28/2017 1459   CREATININE 0.68 04/27/2018 1014   CALCIUM 9.1 04/27/2018 1014   PROT 7.1 04/27/2018 1014   PROT 6.5 09/28/2017 1459   ALBUMIN 4.0 04/27/2018 1014   ALBUMIN 4.0 09/28/2017 1459   AST 16 04/27/2018 1014   ALT 12 04/27/2018 1014   ALKPHOS 60 04/27/2018 1014   BILITOT 0.4 04/27/2018 1014   BILITOT 0.2 09/28/2017 1459   GFRNONAA >60 12/01/2017 1018   GFRAA >60 12/01/2017 1018   Lab Results  Component Value Date   CHOL 200 04/27/2018   HDL 57.80 04/27/2018   LDLCALC 111 (H) 04/27/2018   TRIG 153.0 (H) 04/27/2018   CHOLHDL 3 04/27/2018   No results found for: HGBA1C No results found for: VITAMINB12 Lab Results  Component Value Date   TSH 1.32 04/27/2018     ASSESSMENT AND PLAN 23 y.o. year old female  has a past medical history of Acne, Angioedema, Aquagenic angio-edema-urticaria, Asthma, Dysautonomia ([place]), Dysrhythmia, Fibroid, Headache(784.0), Pneumonia, and Vasculitis ([place]). here with     ICD-10-CM   1. Chronic migraine without aura, with intractable migraine, so stated, with status migrainosus G43.711     She reports  significant relief with Emgality and has noticed reduction of migraines. We will try Ubrelvy for breakthrough migraines. Stop rizatriptan as it is making  her groggy. I have advised that she avoid OTC analgesics due to potential for rebound headaches. Adequate hydration advised. We have also discussed possible pretreatment as needed prior to menstrual cycles. She will follow up with me in 1 year, sooner if needed.     No orders of the defined types were placed in this encounter.    Meds ordered this encounter  Medications  . Ubrogepant (UBRELVY) 100 MG TABS    Sig: Take 100 mg by mouth daily as needed.    Dispense:  10 tablet    Refill:  11    Order Specific Question:   Supervising Provider    Answer:   Melvenia Beam [id number]      I spent 15 minutes with the patient. 50% of this time was spent counseling and educating patient on plan of care and medications.    Debbora Presto, FNP-C 11/29/2018, 1:57 PM [facility] [address] [place], [place] [address] [phone]

## 2018-11-23 ENCOUNTER — Telehealth: Payer: Self-pay | Admitting: Primary Care

## 2018-11-23 DIAGNOSIS — Z111 Encounter for screening for respiratory tuberculosis: Secondary | ICD-10-CM

## 2018-11-23 NOTE — Telephone Encounter (Signed)
Pt need a Quantiferon Gold test for school. Please advise pt

## 2018-11-23 NOTE — Telephone Encounter (Signed)
Okay to schedule.  Thanks 

## 2018-11-24 NOTE — Telephone Encounter (Signed)
Lvm asking pt to call office to schedule lab visit.

## 2018-11-29 ENCOUNTER — Other Ambulatory Visit: Payer: Self-pay

## 2018-11-29 ENCOUNTER — Encounter: Payer: Self-pay | Admitting: Family Medicine

## 2018-11-29 ENCOUNTER — Ambulatory Visit (INDEPENDENT_AMBULATORY_CARE_PROVIDER_SITE_OTHER): Payer: BLUE CROSS/BLUE SHIELD | Admitting: Family Medicine

## 2018-11-29 VITALS — BP 136/77 | HR 81 | Resp 14 | Ht 67.0 in | Wt 154.0 lb

## 2018-11-29 DIAGNOSIS — G43711 Chronic migraine without aura, intractable, with status migrainosus: Secondary | ICD-10-CM | POA: Diagnosis not present

## 2018-11-29 MED ORDER — UBROGEPANT 100 MG PO TABS: 100.0000 mg | ORAL_TABLET | Freq: Every day | ORAL | 11 refills | Status: DC | PRN

## 2018-11-29 NOTE — Patient Instructions (Signed)
Start Faith Jordan  Stop rizatriptan  Avoid OTC analgesics like Tylenol or Aleve   Analgesic Rebound Headache An analgesic rebound headache, sometimes called a medication overuse headache, is a headache that comes after pain medicine (analgesic) taken to treat the original (primary) headache has worn off. Any type of primary headache can return as a rebound headache if a person regularly takes analgesics more than three times a week to treat it. The types of primary headaches that are commonly associated with rebound headaches include:  Migraines.  Headaches that arise from tense muscles in the head and neck area (tension headaches).  Headaches that develop and happen again (recur) on one side of the head and around the eye (cluster headaches). If rebound headaches continue, they become chronic daily headaches. What are the causes? This condition may be caused by frequent use of:  Over-the-counter medicines such as aspirin, ibuprofen, and acetaminophen.  Sinus relief medicines and other medicines that contain caffeine.  Narcotic pain medicines such as codeine and oxycodone. What are the signs or symptoms? The symptoms of a rebound headache are the same as the symptoms of the original headache. Some of the symptoms of specific types of headaches include: Migraine headache  Pulsing or throbbing pain on one or both sides of the head.  Severe pain that interferes with daily activities.  Pain that is worsened by physical activity.  Nausea, vomiting, or both.  Pain with exposure to bright light, loud noises, or strong smells.  General sensitivity to bright light, loud noises, or strong smells.  Visual changes.  Numbness of one or both arms. Tension headache  Pressure around the head.  Dull, aching head pain.  Pain felt over the front and sides of the head.  Tenderness in the muscles of the head, neck, and shoulders. Cluster headache  Severe pain that begins in or around one  eye or temple.  Redness and tearing in the eye on the same side as the pain.  Droopy or swollen eyelid.  One-sided head pain.  Nausea.  Runny nose.  Sweaty, pale facial skin.  Restlessness. How is this diagnosed? This condition is diagnosed by:  Reviewing your medical history. This includes the nature of your primary headaches.  Reviewing the types of pain medicines that you have been using to treat your headaches and how often you take them. How is this treated? This condition may be treated or managed by:  Discontinuing frequent use of the analgesic medicine. Doing this may worsen your headaches at first, but the pain should eventually become more manageable, less frequent, and less severe.  Seeing a headache specialist. He or she may be able to help you manage your headaches and help make sure there is not another cause of the headaches.  Using methods of stress relief, such as acupuncture, counseling, biofeedback, and massage. Talk with your health care provider about which methods might be good for you. Follow these instructions at home:  Take over-the-counter and prescription medicines only as told by your health care provider.  Stop the repeated use of pain medicine as told by your health care provider. Stopping can be difficult. Carefully follow instructions from your health care provider.  Avoid triggers that are known to cause your primary headaches.  Keep all follow-up visits as told by your health care provider. This is important. Contact a health care provider if:  You continue to experience headaches after following treatments that your health care provider recommended. Get help right away if:  You develop  new headache pain.  You develop headache pain that is different than what you have experienced in the past.  You develop numbness or tingling in your arms or legs.  You develop changes in your speech or vision. This information is not intended to  replace advice given to you by your health care provider. Make sure you discuss any questions you have with your health care provider. Document Released: 12/25/2003 Document Revised: 04/23/2016 Document Reviewed: 03/08/2016 Elsevier Interactive Patient Education  2019 [employer].

## 2018-11-30 ENCOUNTER — Other Ambulatory Visit (INDEPENDENT_AMBULATORY_CARE_PROVIDER_SITE_OTHER): Payer: BLUE CROSS/BLUE SHIELD

## 2018-11-30 ENCOUNTER — Telehealth: Payer: Self-pay

## 2018-11-30 DIAGNOSIS — Z111 Encounter for screening for respiratory tuberculosis: Secondary | ICD-10-CM | POA: Diagnosis not present

## 2018-11-30 NOTE — Progress Notes (Signed)
Made any corrections needed, and agree with history, physical, neuro exam,assessment and plan as stated.     Israel Wunder, MD [facility]  

## 2018-11-30 NOTE — Telephone Encounter (Signed)
Pending approval for Ubrevly 100 mg Key: [id number]  Rx #: [id number] ICD 10 code: I68.032 [facility]

## 2018-11-30 NOTE — Telephone Encounter (Signed)
Received a fax from [place] approving the medication from 11/30/2018 through 12/01/2019. A copy of the letter has been faxed to patients pharmacy  [id number] - [place], [place] [phone] (Phone) [phone] (Fax)   Confirmation faxed has been received.

## 2018-12-02 LAB — QUANTIFERON-TB GOLD PLUS
Mitogen-NIL: >10 IU/mL
NIL: 0.03 IU/mL
QuantiFERON-TB Gold Plus: NEGATIVE
TB1-NIL: 0 IU/mL
TB2-NIL: 0.01 IU/mL

## 2018-12-03 DIAGNOSIS — Z20828 Contact with and (suspected) exposure to other viral communicable diseases: Secondary | ICD-10-CM

## 2018-12-04 ENCOUNTER — Encounter: Payer: Self-pay | Admitting: *Deleted

## 2018-12-04 MED ORDER — OSELTAMIVIR PHOSPHATE 75 MG PO CAPS: 75.0000 mg | ORAL_CAPSULE | Freq: Every day | ORAL | 0 refills | Status: AC

## 2018-12-06 ENCOUNTER — Ambulatory Visit: Payer: Self-pay

## 2018-12-13 ENCOUNTER — Ambulatory Visit (INDEPENDENT_AMBULATORY_CARE_PROVIDER_SITE_OTHER): Payer: BLUE CROSS/BLUE SHIELD

## 2018-12-13 DIAGNOSIS — L500 Allergic urticaria: Secondary | ICD-10-CM

## 2019-01-05 ENCOUNTER — Ambulatory Visit: Payer: BLUE CROSS/BLUE SHIELD | Admitting: Family Medicine

## 2019-01-05 ENCOUNTER — Telehealth: Payer: Self-pay

## 2019-01-05 ENCOUNTER — Telehealth: Payer: BLUE CROSS/BLUE SHIELD | Admitting: Family

## 2019-01-05 DIAGNOSIS — R6889 Other general symptoms and signs: Secondary | ICD-10-CM

## 2019-01-05 MED ORDER — BENZONATATE 100 MG PO CAPS: 100.0000 mg | ORAL_CAPSULE | Freq: Three times a day (TID) | ORAL | 0 refills | Status: DC | PRN

## 2019-01-05 NOTE — Telephone Encounter (Signed)
pts mom calling (DPR signed) on 01/04/19 started with prod cough with clear phlegm; last night SOB; pt used moms albuterol inhaler which helped. This morning pt had t 100.7, started aching all over. Pt has used albuterol inhaler again this morning.Pt has hx of angioedema; this morning pt has some spasm and tightening of throat,pt thought might have some swelling in throat but pt can breathe per pts mom.pt has epipen. Pt had appt with allergist today but since pt has fever the allergist cancelled appt. Pt is so hoarse that pts father cannot understand what she is saying on cell phone (pt is upstairs). 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 Per Gentry Fitz NP if swelling or tightness in throat pt to go to ED. pts mom said will not go to ED. I spoke with D gessner FNP who had appt this afternoon said pt needs to go to ED for eval of angioedema symptoms. pts mom said again will not go to ED but will go to [facility]. Glenda Chroman FNP said to go to [facility] and for pts mom to call [facility] prior to going to give pts information including symptoms and possible exposures. Mrs Freda Munro voiced understanding. FYI to Gentry Fitz NP and Doris Cheadle FNP.

## 2019-01-05 NOTE — Telephone Encounter (Signed)
Patient's mother called back stated Faith Jordan had a fever of 100.7 and had been exposed to her brother who had laryngitis. Her brother was at a school in [place] who is being tested for COVID-19. Advise patient to see PCP or go to one of the Health tents near by her location to seek medical assistance-per [facility].

## 2019-01-05 NOTE — Telephone Encounter (Signed)
Noted  

## 2019-01-05 NOTE — Telephone Encounter (Signed)
Patient's mother called C/O broncho spasms that started yesterday. Patient's mother stated patient gets Xolair injections monthly for angioedema and stated she is having SOB. Patient was given a dose of her mother's rescue inhaler and father who is an EMT gave her O2 from his truck. Patient was last seen for an OV on 11/23/2017. Patient is being seen today at 2 pm for her sx.

## 2019-01-05 NOTE — Telephone Encounter (Signed)
Noted and agree. 

## 2019-01-05 NOTE — Progress Notes (Signed)
E-Visit for Corona Virus Screening  Based on your current symptoms, you may very well have the virus, however your symptoms are mild. Currently, not all patients are being tested. If the symptoms are mild and there is not a known exposure, performing the test is not indicated.   Approximately 5 minutes was spent documenting and reviewing patient's chart.    Coronavirus disease 2019 (COVID-19) is a respiratory illness that can spread from person to person. The virus that causes COVID-19 is a new virus that was first identified in the country of [place] but is now found in multiple other countries and has spread to the [place].  Symptoms associated with the virus are mild to severe fever, cough, and shortness of breath. There is currently no vaccine to protect against COVID-19, and there is no specific antiviral treatment for the virus.   To be considered HIGH RISK for Coronavirus (COVID-19), you have to meet the following criteria:  . Traveled to [place], [place], [place], [place] or [place]; or in the [place] to [place], [place], [place], or [place]; and have fever, cough, and shortness of breath within the last 2 weeks of travel OR  . Been in close contact with a person diagnosed with COVID-19 within the last 2 weeks and have fever, cough, and shortness of breath  . IF YOU DO NOT MEET THESE CRITERIA, YOU ARE CONSIDERED LOW RISK FOR COVID-19.   It is vitally important that if you feel that you have an infection such as this virus or any other virus that you stay home and away from places where you may spread it to others.  You should self-quarantine for 14 days if you have symptoms that could potentially be coronavirus and avoid contact with people age 65 and older.   You can use medication such as A prescription cough medication called Tessalon Perles 100 mg. You may take 1-2 capsules every 8 hours as needed for cough  You may also take acetaminophen (Tylenol) as needed for  fever.   Reduce your risk of any infection by using the same precautions used for avoiding the common cold or flu:  . Wash your hands often with soap and warm water for at least 20 seconds.  If soap and water are not readily available, use an alcohol-based hand sanitizer with at least 60% alcohol.  . If coughing or sneezing, cover your mouth and nose by coughing or sneezing into the elbow areas of your shirt or coat, into a tissue or into your sleeve (not your hands). . Avoid shaking hands with others and consider head nods or verbal greetings only. . Avoid touching your eyes, nose, or mouth with unwashed hands.  . Avoid close contact with people who are sick. . Avoid places or events with large numbers of people in one location, like concerts or sporting events. . Carefully consider travel plans you have or are making. . If you are planning any travel outside or inside the [place], visit the CDC's Travelers' Health webpage for the latest health notices. . If you have some symptoms but not all symptoms, continue to monitor at home and seek medical attention if your symptoms worsen. . If you are having a medical emergency, call 911.  HOME CARE . Only take medications as instructed by your medical team. . Drink plenty of fluids and get plenty of rest. . A steam or ultrasonic humidifier can help if you have congestion.   GET HELP RIGHT AWAY IF: .   You develop worsening fever. . You become short of breath . You cough up blood. . Your symptoms become more severe MAKE SURE YOU   Understand these instructions.  Will watch your condition.  Will get help right away if you are not doing well or get worse.  Your e-visit answers were reviewed by a board certified advanced clinical practitioner to complete your personal care plan.  Depending on the condition, your plan could have included both over the counter or prescription medications.  If there is a problem please reply once you have received a  response from your provider. Your safety is important to us.  If you have drug allergies check your prescription carefully.    You can use MyChart to ask questions about today's visit, request a non-urgent call back, or ask for a work or school excuse for 24 hours related to this e-Visit. If it has been greater than 24 hours you will need to follow up with your provider, or enter a new e-Visit to address those concerns. You will get an e-mail in the next two days asking about your experience.  I hope that your e-visit has been valuable and will speed your recovery. Thank you for using e-visits.    

## 2019-01-10 ENCOUNTER — Ambulatory Visit: Payer: Self-pay

## 2019-01-16 ENCOUNTER — Other Ambulatory Visit: Payer: Self-pay

## 2019-01-16 ENCOUNTER — Ambulatory Visit (INDEPENDENT_AMBULATORY_CARE_PROVIDER_SITE_OTHER): Payer: BLUE CROSS/BLUE SHIELD | Admitting: Family Medicine

## 2019-01-16 ENCOUNTER — Other Ambulatory Visit: Payer: Self-pay | Admitting: Family Medicine

## 2019-01-16 ENCOUNTER — Encounter: Payer: Self-pay | Admitting: Family Medicine

## 2019-01-16 ENCOUNTER — Ambulatory Visit: Payer: BLUE CROSS/BLUE SHIELD | Admitting: Family Medicine

## 2019-01-16 DIAGNOSIS — G43711 Chronic migraine without aura, intractable, with status migrainosus: Secondary | ICD-10-CM

## 2019-01-16 MED ORDER — AMITRIPTYLINE HCL 25 MG PO TABS: 25.0000 mg | ORAL_TABLET | Freq: Every day | ORAL | 3 refills | Status: DC

## 2019-01-16 MED ORDER — UBROGEPANT 100 MG PO TABS: 100.0000 mg | ORAL_TABLET | Freq: Every day | ORAL | 11 refills | Status: DC | PRN

## 2019-01-16 MED ORDER — TOPIRAMATE 25 MG PO TABS: 25.0000 mg | ORAL_TABLET | Freq: Every day | ORAL | 11 refills | Status: DC

## 2019-01-16 NOTE — Progress Notes (Signed)
PATIENT: Faith Jordan DOB: 11-25-1995  REASON FOR VISIT: follow up HISTORY FROM: patient  Virtual Visit via Telephone Note  I connected with [name] on 01/16/19 at  1:00 PM EDT by telephone and verified that I am speaking with the correct person using two identifiers.   I discussed the limitations, risks, security and privacy concerns of performing an evaluation and management service by telephone and the availability of in person appointments. I also discussed with the patient that there may be a patient responsible charge related to this service. The patient expressed understanding and agreed to proceed.   History of Present Illness:  01/16/19 Faith Jordan is a 23 y.o. female seen today for follow up.  She reports that her migraines have worsened specifically over the last month.  She feels that stress is definitely a trigger.  She is in school and finishing up with exams at this time.  She knows that weather also contributes.  She notices significant worsening around the time of her menstrual cycle.  Pain is typically right-sided retro-orbital pressure.  Occasionally there is throbbing, light sensitivity and nausea.  She continues Emgality every month.  She is using Roselyn Meier for abortive therapy that does help temporarily.  She is also on propranolol for tachycardia.  She reports a history of syncope as well that is followed closely by her cardiologist.  She has tried topiramate in the past but is unsure why this medication was stopped.  She is also tried imipemide but reports that it did not help.  She has taken Maxalt and Imitrex for abortive therapy but reports tachycardia with a triptan therapy.  She is allergic to NSAIDs.   Observations/Objective:  Generalized: Well developed, in no acute distress  Mentation: Alert oriented to time, place, history taking. Follows all commands speech and language fluent   Assessment and Plan:  23 y.o. year old female  has a past  medical history of Acne, Angioedema, Aquagenic angio-edema-urticaria, Asthma, Dysautonomia ([place]), Dysrhythmia, Fibroid, Headache(784.0), Pneumonia, and Vasculitis ([place]). here with    ICD-10-CM   1. Chronic migraine without aura, with intractable migraine, so stated, with status migrainosus G43.711    Unfortunately Kambryn is suffering from worsening of her migraines recently most likely due to menstrual cycle and increased stress.  She continues to tolerate [place].  We will add topiramate 25 mg at night for 1 week and then increase to 50 mg at night for prevention. I will also increase Ubrelvy to 100mg  as prescribed for abortive therapy.  She was advised to follow-up closely with me for any worsening or unresolved symptoms.  Breiana verbalizes understanding and agreement with the plan.  No orders of the defined types were placed in this encounter.   Meds ordered this encounter  Medications  . DISCONTD: amitriptyline (ELAVIL) 25 MG tablet    Sig: Take 1 tablet (25 mg total) by mouth at bedtime.    Dispense:  30 tablet    Refill:  3    Order Specific Question:   Supervising Provider    Answer:   Melvenia Beam [id number]  . Ubrogepant (UBRELVY) 100 MG TABS    Sig: Take 100 mg by mouth daily as needed. Take one tablet at onset of headache, may repeat 1 tablet in 2 hours, no more than 2 tablets in 24 hours    Dispense:  10 tablet    Refill:  11    Order Specific Question:   Supervising Provider  AnswerMelvenia Beam [[id number]]  . topiramate (TOPAMAX) 25 MG tablet    Sig: Take 1 tablet (25 mg total) by mouth daily. Start 1 tablet daily for 1 week, then increase to 2 tablets    Dispense:  60 tablet    Refill:  11    Order Specific Question:   Supervising Provider    Answer:   Melvenia Beam [[id number]]     Follow Up Instructions:  I discussed the assessment and treatment plan with the patient. The patient was provided an opportunity to ask questions and all were  answered. The patient agreed with the plan and demonstrated an understanding of the instructions.   The patient was advised to call back or seek an in-person evaluation if the symptoms worsen or if the condition fails to improve as anticipated.  I provided 25 minutes of non-face-to-face time during this encounter.   Debbora Presto, NP

## 2019-01-17 MED ORDER — UBROGEPANT 50 MG PO TABS: 100.0000 mg | ORAL_TABLET | ORAL | 2 refills | Status: DC | PRN

## 2019-01-17 NOTE — Telephone Encounter (Signed)
Phone in instructions for ubrelvy 50mg  tabs to [facility] (LMVM).  I LMVM for pt relating to backorder on 100mg  tabs and new tab strength 50mg , (take 2 tabs onset migraine, may repeat in 2 hours if needed, max 4 tab/ 24hours).  She is to call back if questions.

## 2019-01-17 NOTE — Progress Notes (Signed)
Made any corrections needed, and agree with history, physical, neuro exam,assessment and plan as stated.     Antonia Ahern, MD [facility]  

## 2019-01-17 NOTE — Addendum Note (Signed)
Addended by: Brandon Melnick on: 01/17/2019 10:32 AM   Modules accepted: Orders

## 2019-01-17 NOTE — Telephone Encounter (Signed)
Received request that ubrelvy 100mg  tabs on backorder. Requested 50mg  tabs.

## 2019-01-22 ENCOUNTER — Ambulatory Visit (INDEPENDENT_AMBULATORY_CARE_PROVIDER_SITE_OTHER): Payer: BLUE CROSS/BLUE SHIELD | Admitting: *Deleted

## 2019-01-22 DIAGNOSIS — L500 Allergic urticaria: Secondary | ICD-10-CM | POA: Diagnosis not present

## 2019-02-06 LAB — HM PAP SMEAR

## 2019-02-09 ENCOUNTER — Other Ambulatory Visit: Payer: Self-pay | Admitting: Cardiovascular Disease

## 2019-02-09 ENCOUNTER — Telehealth: Payer: Self-pay | Admitting: Cardiovascular Disease

## 2019-02-09 NOTE — Telephone Encounter (Signed)
LVM to schedule recall. °

## 2019-02-09 NOTE — Telephone Encounter (Signed)
Inderal 20 mg refilled.

## 2019-02-12 ENCOUNTER — Encounter: Payer: Self-pay | Admitting: Primary Care

## 2019-02-12 ENCOUNTER — Other Ambulatory Visit: Payer: Self-pay

## 2019-02-12 ENCOUNTER — Ambulatory Visit (INDEPENDENT_AMBULATORY_CARE_PROVIDER_SITE_OTHER): Payer: BLUE CROSS/BLUE SHIELD | Admitting: Primary Care

## 2019-02-12 ENCOUNTER — Other Ambulatory Visit: Payer: BLUE CROSS/BLUE SHIELD

## 2019-02-12 DIAGNOSIS — R350 Frequency of micturition: Secondary | ICD-10-CM

## 2019-02-12 DIAGNOSIS — R300 Dysuria: Secondary | ICD-10-CM

## 2019-02-12 HISTORY — DX: Frequency of micturition: R35.0

## 2019-02-12 LAB — POC URINALSYSI DIPSTICK (AUTOMATED)
Bilirubin, UA: NEGATIVE
Blood, UA: NEGATIVE
Glucose, UA: NEGATIVE
Ketones, UA: NEGATIVE
Nitrite, UA: NEGATIVE
Protein, UA: NEGATIVE
Spec Grav, UA: 1.01 (ref 1.010–1.025)
Urobilinogen, UA: 0.2 E.U./dL
pH, UA: 6.5 (ref 5.0–8.0)

## 2019-02-12 NOTE — Progress Notes (Signed)
Subjective:    Patient ID: Faith Jordan, female    DOB: 06-03-1996, 23 y.o.   MRN: [id number]  HPI  Virtual Visit via Video Note  I connected with Faith Jordan on 02/12/19 at 12:20 PM EDT by a video enabled telemedicine application and verified that I am speaking with the correct person using two identifiers.   I discussed the limitations of evaluation and management by telemedicine and the availability of in person appointments. The patient expressed understanding and agreed to proceed. She is at home, I am in the office.  We attempted a video visit but the video failed. We had to complete our visit via phone.   History of Present Illness:  Ms. Faith Jordan is a 23 year old female with a history of neurogenic syncope, chronic migraines, anemia who presents today with a chief complaint of urinary frequency.  She also reports urgency, dysuria. Symptoms began yesterday. She's taken AZO OTC yesterday with some improvement. She completed an at home UTI test which was "positive". She dropped off a specimen to our office earlier this morning. This morning she's noticed some improvement but continues with frequency. She denies vaginal itching/discharge, abdominal pain, nausea, hematuria.    Observations/Objective:  Alert and oriented. Appears well. No distress.  Assessment and Plan:  Symptoms do sound similar to acute cystitis, doesn't have any vaginal symptoms to suggest BV/yeast infections. UA today with 1+ leuks, negative nitrites and blood. Culture sent. Her home positive test could be from contamination during collection.  Discussed to push intake of water.  Will await culture results.  Follow Up Instructions:  Continue to stay hydrated with water as discussed.  You may continue to take the medication you bought at the pharmacy.  I will be in touch once we receive your urine test.  It was a pleasure to see you today! Allie Bossier, NP-C    I discussed the assessment and  treatment plan with the patient. The patient was provided an opportunity to ask questions and all were answered. The patient agreed with the plan and demonstrated an understanding of the instructions.   The patient was advised to call back or seek an in-person evaluation if the symptoms worsen or if the condition fails to improve as anticipated.     Pleas Koch, NP    Review of Systems  Constitutional: Negative for fever.  Gastrointestinal: Negative for abdominal pain and nausea.  Genitourinary: Positive for frequency and urgency. Negative for flank pain, hematuria, pelvic pain and vaginal discharge.       Past Medical History:  Diagnosis Date  . Acne   . Angioedema   . Aquagenic angio-edema-urticaria   . Asthma    no problems /not used recently  . Dysautonomia ([place])   . Dysrhythmia    sinus tachycardia  . Fibroid    right breast, adenoma  . Headache(784.0)   . Pneumonia    hx  6th grade  . Vasculitis ([place])      Social History   Socioeconomic History  . Marital status: Single    Spouse name: Not on file  . Number of children: Not on file  . Years of education: Not on file  . Highest education level: Not on file  Occupational History  . Not on file  Social Needs  . Financial resource strain: Not on file  . Food insecurity:    Worry: Not on file    Inability: Not on file  . Transportation needs:  Medical: Not on file    Non-medical: Not on file  Tobacco Use  . Smoking status: Never Smoker  . Smokeless tobacco: Never Used  Substance and Sexual Activity  . Alcohol use: No  . Drug use: No  . Sexual activity: Not on file  Lifestyle  . Physical activity:    Days per week: Not on file    Minutes per session: Not on file  . Stress: Not on file  Relationships  . Social connections:    Talks on phone: Not on file    Gets together: Not on file    Attends religious service: Not on file    Active member of club or organization: Not on file    Attends  meetings of clubs or organizations: Not on file    Relationship status: Not on file  . Intimate partner violence:    Fear of current or ex partner: Not on file    Emotionally abused: Not on file    Physically abused: Not on file    Forced sexual activity: Not on file  Other Topics Concern  . Not on file  Social History Narrative   Student at [employer], [place].    Enjoys reading.   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     Past Surgical History:  Procedure Laterality Date  . BREAST LUMPECTOMY Right   . BREAST LUMPECTOMY Right   . MASS EXCISION Right 10/07/2014   Procedure: EXCISION OF RIGHT BREAST MASS;  Surgeon: Jackolyn Confer, MD;  Location: [place];  Service: General;  Laterality: Right;  . TONSILLECTOMY AND ADENOIDECTOMY    . TOOTH EXTRACTION      Family History  Problem Relation Age of Onset  . Breast cancer Maternal Grandmother 54  . Myasthenia gravis Maternal Grandfather   . Migraines Mother   . Narcolepsy Mother   . Hypothyroidism Mother   . Asthma Other   . Cancer Other   . Epilepsy Other     Allergies  Allergen Reactions  . 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 Nsaids Dermatitis    Reaction similar to Stephens-Johnson, blistering peeling rash  . Sulfa Antibiotics Swelling    Facial swelling    Current Outpatient Medications on File Prior to Visit  Medication Sig Dispense Refill  . ALPRAZolam (XANAX) 0.25 MG tablet Take 0.25 mg by mouth daily as needed for anxiety.    . baclofen (LIORESAL) 10 MG tablet Take 10 mg by mouth daily as needed for muscle spasms (and headaches).    . benzonatate (TESSALON PERLES) 100 MG capsule Take 1 capsule (100 mg total) by mouth 3 (three) times daily as needed. 20 capsule 0  . diphenhydrAMINE (BENADRYL) 25 MG tablet Take 50 mg by mouth daily as needed (Angio Edema). Takes with Zantac    . EPINEPHrine 0.3 mg/0.3 mL IJ SOAJ injection Inject into the muscle once.     . Galcanezumab-gnlm (EMGALITY) 120 MG/ML SOAJ Inject 120 mg into the skin every 30 (thirty) days. 1 pen 11  . norgestimate-ethinyl estradiol (ORTHO-CYCLEN,SPRINTEC,PREVIFEM) 0.25-35 MG-MCG tablet Take 1 tablet by mouth daily.    . promethazine (PHENERGAN) 25 MG tablet Take 25 mg by mouth as directed.    . propranolol (INDERAL) 20 MG tablet TAKE 1 TABLET (20 MG TOTAL) BY MOUTH DAILY. NEEDS APPOINTMENT FOR FUTURE REFILLS 30 tablet 0  . topiramate (TOPAMAX) 25 MG tablet Take 1 tablet (25 mg total) by mouth daily. Start  1 tablet daily for 1 week, then increase to 2 tablets 60 tablet 11  . Ubrogepant (UBRELVY) 50 MG TABS Take 100 mg by mouth as needed (takes 2 tablets onset migraine, may repeat in 2 hours (mx 4tabs/24hours)). 20 tablet 2  . XOLAIR 150 MG injection INJECT 300 MG SUBCUTANEOUSLY EVERY 4 WEEKS. 2 each 10   Current Facility-Administered Medications on File Prior to Visit  Medication Dose Route Frequency Provider Last Rate Last Dose  . omalizumab Arvid Right) injection 300 mg  300 mg Subcutaneous Q28 days Valentina Shaggy, MD   300 mg at 01/22/19 1422    LMP 01/07/2019    Objective:   Physical Exam  Constitutional: She is oriented to person, place, and time. She appears well-nourished.  Respiratory: Effort normal.  Neurological: She is alert and oriented to person, place, and time.  Psychiatric: She has a normal mood and affect.           Assessment & Plan:

## 2019-02-12 NOTE — Assessment & Plan Note (Signed)
Symptoms do sound similar to acute cystitis, doesn't have any vaginal symptoms to suggest BV/yeast infections. UA today with 1+ leuks, negative nitrites and blood. Culture sent. Her home positive test could be from contamination during collection.  Discussed to push intake of water.  Will await culture results.

## 2019-02-12 NOTE — Patient Instructions (Signed)
Continue to stay hydrated with water as discussed.  You may continue to take the medication you bought at the pharmacy.  I will be in touch once we receive your urine test.  It was a pleasure to see you today! Allie Bossier, NP-C

## 2019-02-13 LAB — URINE CULTURE
MICRO NUMBER:: 424000
SPECIMEN QUALITY:: ADEQUATE

## 2019-02-20 ENCOUNTER — Ambulatory Visit: Payer: BLUE CROSS/BLUE SHIELD

## 2019-02-21 ENCOUNTER — Ambulatory Visit (INDEPENDENT_AMBULATORY_CARE_PROVIDER_SITE_OTHER): Payer: BLUE CROSS/BLUE SHIELD | Admitting: *Deleted

## 2019-02-21 ENCOUNTER — Other Ambulatory Visit: Payer: Self-pay

## 2019-02-21 DIAGNOSIS — L500 Allergic urticaria: Secondary | ICD-10-CM | POA: Diagnosis not present

## 2019-02-27 ENCOUNTER — Ambulatory Visit (INDEPENDENT_AMBULATORY_CARE_PROVIDER_SITE_OTHER): Payer: BLUE CROSS/BLUE SHIELD | Admitting: Allergy & Immunology

## 2019-02-27 ENCOUNTER — Encounter: Payer: Self-pay | Admitting: Allergy & Immunology

## 2019-02-27 ENCOUNTER — Other Ambulatory Visit: Payer: Self-pay

## 2019-02-27 DIAGNOSIS — L500 Allergic urticaria: Secondary | ICD-10-CM

## 2019-02-27 NOTE — Patient Instructions (Addendum)
1. Allergic urticaria - We are going to space out the Xolair to every 6 weeks to see how you do. - we can do that for 6 months and then re-assess with possible spacing to every 8 weeks.  - I will let Tammy know of our plans.   2. Return in about 6 months (around 08/30/2019).     Please inform us of any Emergency Department visits, hospitalizations, or changes in symptoms. Call us before going to the ED for breathing or allergy symptoms since we might be able to fit you in for a sick visit. Feel free to contact us anytime with any questions, problems, or concerns.  It was a pleasure to see you again today! Happy New Year!   Websites that have reliable patient information: 1. American Academy of Asthma, Allergy, and Immunology: www.aaaai.org 2. Food Allergy Research and Education (FARE): foodallergy.org 3. Mothers of Asthmatics: http://www.asthmacommunitynetwork.org 4. American College of Allergy, Asthma, and Immunology: www.acaai.org

## 2019-02-27 NOTE — Progress Notes (Signed)
RE: Faith Jordan MRN: [id number] DOB: Apr 23, 1996 Date of Telemedicine Visit: 02/27/2019  Referring provider: Pleas Koch, NP Primary care provider: Pleas Koch, NP  Chief Complaint: Urticaria   Telemedicine Follow Up Visit via Telephone: I connected with Faith Jordan for a follow up on 02/27/19 by telephone and verified that I am speaking with the correct person using two identifiers.   I discussed the limitations, risks, security and privacy concerns of performing an evaluation and management service by telephone and the availability of in person appointments. I also discussed with the patient that there may be a patient responsible charge related to this service. The patient expressed understanding and agreed to proceed.  Patient is at home accompanied by herself who provided/contributed to the history.  Provider is at the office.  Visit start time: 9:20 AM Visit end time: 9:45 AM Insurance consent/check in by: [employer] consent and medical assistant/nurse: Garlon Hatchet  History of Present Illness:  She is a 23 y.o. 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 Her previous allergy office visit was in 2019 with Dr. Ernst Bowler. She has been stable on Xolair for over one year. She did have an Endocrinology visit and there was no concern for hypothyroidism.   Faith Jordan is a 23 y.o. female presenting for a follow up visit for allergic uticaria.  Patient currently uses Xolair 300mg  every 4 weeks.  She denies side effects from the medication.  She reports a history of fainting after injection administration but states now she lays down when injecting and this has resolved fainting episodes.  She states that her prior symptoms of hand and feet swelling and hives has resolved with Xolair. Patient expressed interest in getting off of Xolair.  Discussed spacing out the injections and she was agreeable.    Otherwise, there have been no  changes to her past medical history, surgical history, family history, or social history.  Assessment and Plan:  Faith Jordan is a 23 y.o. 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 Allergic urticaria - We are going to space out the Xolair to every 6 weeks to see how you do. - we can do that for 6 months and then re-assess with possible spacing to every 8 weeks.  - I will let Tammy know of our plans.   2. Return in about 6 months (around 08/30/2019).      Diagnostics: None.  Medication List:  Current Outpatient Medications  Medication Sig Dispense Refill   ALPRAZolam (XANAX) 0.25 MG tablet Take 0.25 mg by mouth daily as needed for anxiety.     baclofen (LIORESAL) 10 MG tablet Take 10 mg by mouth daily as needed for muscle spasms (and headaches).     benzonatate (TESSALON PERLES) 100 MG capsule Take 1 capsule (100 mg total) by mouth 3 (three) times daily as needed. 20 capsule 0   diphenhydrAMINE (BENADRYL) 25 MG tablet Take 50 mg by mouth daily as needed (Angio Edema). Takes with Zantac     EPINEPHrine 0.3 mg/0.3 mL IJ SOAJ injection Inject into the muscle once.     Galcanezumab-gnlm (EMGALITY) 120 MG/ML SOAJ Inject 120 mg into the skin every 30 (thirty) days. 1 pen 11   norgestimate-ethinyl estradiol (ORTHO-CYCLEN,SPRINTEC,PREVIFEM) 0.25-35 MG-MCG tablet Take 1 tablet by mouth daily.     promethazine (PHENERGAN) 25 MG tablet Take 25 mg by mouth as directed.     propranolol (INDERAL) 20 MG tablet TAKE 1  TABLET (20 MG TOTAL) BY MOUTH DAILY. NEEDS APPOINTMENT FOR FUTURE REFILLS 30 tablet 0   topiramate (TOPAMAX) 50 MG tablet Take 50 mg by mouth daily.     Ubrogepant (UBRELVY) 50 MG TABS Take 100 mg by mouth as needed (takes 2 tablets onset migraine, may repeat in 2 hours (mx 4tabs/24hours)). 20 tablet 2   XOLAIR 150 MG injection INJECT 300 MG SUBCUTANEOUSLY EVERY 4 WEEKS. 2 each 10   Current Facility-Administered Medications  Medication Dose  Route Frequency Provider Last Rate Last Dose   omalizumab Arvid Right) injection 300 mg  300 mg Subcutaneous Q28 days Valentina Shaggy, MD   300 mg at 02/21/19 9417   Allergies: 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, blistering peeling skin   Nsaids Dermatitis    Reaction similar to Stephens-Johnson, blistering peeling rash   Sulfa Antibiotics Swelling    Facial swelling   I reviewed her past medical history, social history, family history, and environmental history and no significant changes have been reported from previous visits.  Review of Systems  Constitutional: Negative for activity change and appetite change.  HENT: Negative for congestion, postnasal drip, rhinorrhea, sinus pressure and sore throat.   Eyes: Negative for pain, discharge, redness and itching.  Respiratory: Negative for shortness of breath, wheezing and stridor.   Gastrointestinal: Negative for diarrhea, nausea and vomiting.  Musculoskeletal: Negative for arthralgias, joint swelling and myalgias.  Skin: Negative for rash.  Allergic/Immunologic: Negative for environmental allergies and food allergies.    Objective:  Physical exam not obtained as encounter was done via telephone.   Previous notes and tests were reviewed.  I discussed the assessment and treatment plan with the patient. The patient was provided an opportunity to ask questions and all were answered. The patient agreed with the plan and demonstrated an understanding of the instructions.   The patient was advised to call back or seek an in-person evaluation if the symptoms worsen or if the condition fails to improve as anticipated.  I provided 25 minutes of non-face-to-face time during this encounter.  It was my pleasure to participate in [place] care today. Please feel free to contact me with any questions or concerns.   Sincerely,  Valentina Shaggy, MD

## 2019-02-27 NOTE — Progress Notes (Signed)
Start 920 a Location Home End: 9:45 a

## 2019-03-05 ENCOUNTER — Other Ambulatory Visit: Payer: Self-pay | Admitting: Primary Care

## 2019-03-05 ENCOUNTER — Other Ambulatory Visit: Payer: Self-pay | Admitting: Cardiovascular Disease

## 2019-03-05 ENCOUNTER — Other Ambulatory Visit: Payer: Self-pay

## 2019-03-05 ENCOUNTER — Other Ambulatory Visit (INDEPENDENT_AMBULATORY_CARE_PROVIDER_SITE_OTHER): Payer: BLUE CROSS/BLUE SHIELD

## 2019-03-05 DIAGNOSIS — Z111 Encounter for screening for respiratory tuberculosis: Secondary | ICD-10-CM

## 2019-03-07 LAB — QUANTIFERON-TB GOLD PLUS
Mitogen-NIL: 8.1 IU/mL
NIL: 0.02 IU/mL
QuantiFERON-TB Gold Plus: NEGATIVE
TB1-NIL: 0.01 IU/mL
TB2-NIL: 0.01 IU/mL

## 2019-03-21 ENCOUNTER — Ambulatory Visit: Payer: Self-pay

## 2019-03-30 ENCOUNTER — Other Ambulatory Visit: Payer: Self-pay | Admitting: Cardiovascular Disease

## 2019-04-04 ENCOUNTER — Other Ambulatory Visit: Payer: Self-pay

## 2019-04-04 ENCOUNTER — Ambulatory Visit (INDEPENDENT_AMBULATORY_CARE_PROVIDER_SITE_OTHER): Payer: BC Managed Care – PPO

## 2019-04-04 DIAGNOSIS — L500 Allergic urticaria: Secondary | ICD-10-CM

## 2019-04-04 MED ORDER — OMALIZUMAB 150 MG ~~LOC~~ SOLR
300.0000 mg | SUBCUTANEOUS | Status: AC
  Administered 2019-05-16 – 2019-08-09 (×3): 300 mg via SUBCUTANEOUS

## 2019-04-05 ENCOUNTER — Other Ambulatory Visit: Payer: Self-pay | Admitting: Cardiovascular Disease

## 2019-05-05 ENCOUNTER — Other Ambulatory Visit: Payer: Self-pay | Admitting: Cardiovascular Disease

## 2019-05-16 ENCOUNTER — Other Ambulatory Visit: Payer: Self-pay

## 2019-05-16 ENCOUNTER — Ambulatory Visit (INDEPENDENT_AMBULATORY_CARE_PROVIDER_SITE_OTHER): Payer: BC Managed Care – PPO

## 2019-05-16 DIAGNOSIS — L500 Allergic urticaria: Secondary | ICD-10-CM | POA: Diagnosis not present

## 2019-05-30 ENCOUNTER — Ambulatory Visit: Payer: BLUE CROSS/BLUE SHIELD | Admitting: Family Medicine

## 2019-05-30 NOTE — Patient Instructions (Addendum)
Supraorbital nerve block today for acute migraine. Take Ubrelvy and benadryl once home. I recommend rest today  Start magnesium 400mg  daily, as well as naproxen 500mg  twice daily two weeks prior to menstrual cycle every three months. Continue through menstrual cycle. May add Ubrelvy 100mg  two days prior to cycle and continue for 5 days.   Continue topiramate 50mg  daily, Emgality 120mg  monthly and propranlol (as recommended by Cardiology)  Follow up in 1 year, sooner if needed    ; Migraine Headache A migraine headache is a very strong throbbing pain on one side or both sides of your head. This type of headache can also cause other symptoms. It can last from 4 hours to 3 days. Talk with your doctor about what things may bring on (trigger) this condition. What are the causes? The exact cause of this condition is not known. This condition may be triggered or caused by:  Drinking alcohol.  Smoking.  Taking medicines, such as: ? Medicine used to treat chest pain (nitroglycerin). ? Birth control pills. ? Estrogen. ? Some blood pressure medicines.  Eating or drinking certain products.  Doing physical activity. Other things that may trigger a migraine headache include:  Having a menstrual period.  Pregnancy.  Hunger.  Stress.  Not getting enough sleep or getting too much sleep.  Weather changes.  Tiredness (fatigue). What increases the risk?  Being 29-59 years old.  Being female.  Having a family history of migraine headaches.  Being Caucasian.  Having depression or anxiety.  Being very overweight. What are the signs or symptoms?  A throbbing pain. This pain may: ? Happen in any area of the head, such as on one side or both sides. ? Make it hard to do daily activities. ? Get worse with physical activity. ? Get worse around bright lights or loud noises.  Other symptoms may include: ? Feeling sick to your stomach (nauseous). ? Vomiting. ? Dizziness. ? Being  sensitive to bright lights, loud noises, or smells.  Before you get a migraine headache, you may get warning signs (an aura). An aura may include: ? Seeing flashing lights or having blind spots. ? Seeing bright spots, halos, or zigzag lines. ? Having tunnel vision or blurred vision. ? Having numbness or a tingling feeling. ? Having trouble talking. ? Having weak muscles.  Some people have symptoms after a migraine headache (postdromal phase), such as: ? Tiredness. ? Trouble thinking (concentrating). How is this treated?  Taking medicines that: ? Relieve pain. ? Relieve the feeling of being sick to your stomach. ? Prevent migraine headaches.  Treatment may also include: ? Having acupuncture. ? Avoiding foods that bring on migraine headaches. ? Learning ways to control your body functions (biofeedback). ? Therapy to help you know and deal with negative thoughts (cognitive behavioral therapy). Follow these instructions at home: Medicines  Take over-the-counter and prescription medicines only as told by your doctor.  Ask your doctor if the medicine prescribed to you: ? Requires you to avoid driving or using heavy machinery. ? Can cause trouble pooping (constipation). You may need to take these steps to prevent or treat trouble pooping:  Drink enough fluid to keep your pee (urine) pale yellow.  Take over-the-counter or prescription medicines.  Eat foods that are high in fiber. These include beans, whole grains, and fresh fruits and vegetables.  Limit foods that are high in fat and sugar. These include fried or sweet foods. Lifestyle  Do not drink alcohol.  Do not use any  products that contain nicotine or tobacco, such as cigarettes, e-cigarettes, and chewing tobacco. If you need help quitting, ask your doctor.  Get at least 8 hours of sleep every night.  Limit and deal with stress. General instructions      Keep a journal to find out what may bring on your migraine  headaches. For example, write down: ? What you eat and drink. ? How much sleep you get. ? Any change in what you eat or drink. ? Any change in your medicines.  If you have a migraine headache: ? Avoid things that make your symptoms worse, such as bright lights. ? It may help to lie down in a dark, quiet room. ? Do not drive or use heavy machinery. ? Ask your doctor what activities are safe for you.  Keep all follow-up visits as told by your doctor. This is important. Contact a doctor if:  You get a migraine headache that is different or worse than others you have had.  You have more than 15 headache days in one month. Get help right away if:  Your migraine headache gets very bad.  Your migraine headache lasts longer than 72 hours.  You have a fever.  You have a stiff neck.  You have trouble seeing.  Your muscles feel weak or like you cannot control them.  You start to lose your balance a lot.  You start to have trouble walking.  You pass out (faint).  You have a seizure. Summary  A migraine headache is a very strong throbbing pain on one side or both sides of your head. These headaches can also cause other symptoms.  This condition may be treated with medicines and changes to your lifestyle.  Keep a journal to find out what may bring on your migraine headaches.  Contact a doctor if you get a migraine headache that is different or worse than others you have had.  Contact your doctor if you have more than 15 headache days in a month. This information is not intended to replace advice given to you by your health care provider. Make sure you discuss any questions you have with your health care provider. Document Released: 07/13/2008 Document Revised: 01/26/2019 Document Reviewed: 11/16/2018 Elsevier Patient Education  2020 [employer].

## 2019-05-30 NOTE — Progress Notes (Signed)
PATIENT: Faith Jordan DOB: 26-Nov-1995  REASON FOR VISIT: follow up HISTORY FROM: patient  Chief Complaint  Patient presents with  . Follow-up    Rm 7, alone.  Has migraine when has period.  Nothing works.    . Migraine     HISTORY OF PRESENT ILLNESS: Today 05/31/19 Faith Jordan is a 23 y.o. female here today for follow up of migraines. She was restarted on topiramate 50 mg at night about 4 months ago. She continued Emgality injections and propranalol (originally prescribed for tachycardia).   She is taking Amethia for birth control. She is taking OCP continuously for three months with quarterly menstrual cycles. She reports that migraines are very well managed until the week of menstrual cycle. She states that migraine is usually retro orbital (usually right but can be on the left) with pounding and light/sound sensitivity. She has tried [place], Aleve, and Tylenol intermittently for acute management but does not feel anything helps. She can not tolerate triptans due to tachycardia. She has a reported allergy of dermatitis with ibuprofen. She does not have any reaction with Aleve.   She is currently on her menstrual cycle. She has a terrible migraine that is mostly located behind her right eye. She does have mild pain of left forehead as well. She is requesting a nerve block today as this has worked well in the past.   HISTORY: (copied from my note on 01/16/2019)  Faith Jordan is a 23 y.o. female seen today for follow up.  She reports that her migraines have worsened specifically over the last month.  She feels that stress is definitely a trigger.  She is in school and finishing up with exams at this time.  She knows that weather also contributes.  She notices significant worsening around the time of her menstrual cycle.  Pain is typically right-sided retro-orbital pressure.  Occasionally there is throbbing, light sensitivity and nausea.  She continues Emgality every month.   She is using Roselyn Meier for abortive therapy that does help temporarily.  She is also on propranolol for tachycardia.  She reports a history of syncope as well that is followed closely by her cardiologist.  She has tried topiramate in the past but is unsure why this medication was stopped.  She is also tried imipemide but reports that it did not help.  She has taken Maxalt and Imitrex for abortive therapy but reports tachycardia with a triptan therapy.  She is allergic to NSAIDs.   REVIEW OF SYSTEMS: Out of a complete 14 system review of symptoms, the patient complains only of the following symptoms, headaches and all other reviewed systems are negative.   ALLERGIES: Allergies  Allergen Reactions  . 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 Nsaids Dermatitis    Reaction similar to Stephens-Johnson, blistering peeling rash  . Sulfa Antibiotics Swelling    Facial swelling    HOME MEDICATIONS: Outpatient Medications Prior to Visit  Medication Sig Dispense Refill  . ALPRAZolam (XANAX) 0.25 MG tablet Take 0.25 mg by mouth daily as needed for anxiety.    . baclofen (LIORESAL) 10 MG tablet Take 10 mg by mouth daily as needed for muscle spasms (and headaches).    . benzonatate (TESSALON PERLES) 100 MG capsule Take 1 capsule (100 mg total) by mouth 3 (three) times daily as needed. 20 capsule 0  . diphenhydrAMINE (BENADRYL) 25 MG tablet Take 50 mg by mouth daily as needed (  Angio Edema). Takes with Zantac    . EPINEPHrine 0.3 mg/0.3 mL IJ SOAJ injection Inject into the muscle once.    . Galcanezumab-gnlm (EMGALITY) 120 MG/ML SOAJ Inject 120 mg into the skin every 30 (thirty) days. 1 pen 11  . Levonorgestrel-Ethinyl Estradiol (AMETHIA) 0.15-0.03 &0.01 MG tablet Take 1 tablet by mouth daily.    . promethazine (PHENERGAN) 25 MG tablet Take 25 mg by mouth as directed.    . propranolol (INDERAL) 20 MG tablet TAKE 1 TABLET (20 MG TOTAL) BY MOUTH DAILY.  NEEDS APPOINTMENT FOR FUTURE REFILLS 40 tablet 0  . topiramate (TOPAMAX) 50 MG tablet Take 50 mg by mouth daily.    Arvid Right 150 MG injection INJECT 300 MG SUBCUTANEOUSLY EVERY 4 WEEKS. 2 each 10  . Ubrogepant (UBRELVY) 50 MG TABS Take 100 mg by mouth as needed (takes 2 tablets onset migraine, may repeat in 2 hours (mx 4tabs/24hours)). 20 tablet 2  . norgestimate-ethinyl estradiol (ORTHO-CYCLEN,SPRINTEC,PREVIFEM) 0.25-35 MG-MCG tablet Take 1 tablet by mouth daily.     Facility-Administered Medications Prior to Visit  Medication Dose Route Frequency Provider Last Rate Last Dose  . omalizumab Arvid Right) injection 300 mg  300 mg Subcutaneous Q28 days Valentina Shaggy, MD   300 mg at 04/04/19 0836  . omalizumab Arvid Right) injection 300 mg  300 mg Subcutaneous Q6 weeks Valentina Shaggy, MD   300 mg at 05/16/19 1515    PAST MEDICAL HISTORY: Past Medical History:  Diagnosis Date  . Acne   . Angioedema   . Aquagenic angio-edema-urticaria   . Asthma    no problems /not used recently  . Dysautonomia ([place])   . Dysrhythmia    sinus tachycardia  . Fibroid    right breast, adenoma  . Headache(784.0)   . Pneumonia    hx  6th grade  . Vasculitis ([place])     PAST SURGICAL HISTORY: Past Surgical History:  Procedure Laterality Date  . BREAST LUMPECTOMY Right   . BREAST LUMPECTOMY Right   . MASS EXCISION Right 10/07/2014   Procedure: EXCISION OF RIGHT BREAST MASS;  Surgeon: Jackolyn Confer, MD;  Location: [facility];  Service: General;  Laterality: Right;  . TONSILLECTOMY AND ADENOIDECTOMY    . TOOTH EXTRACTION      FAMILY HISTORY: Family History  Problem Relation Age of Onset  . Breast cancer Maternal Grandmother 56  . Myasthenia gravis Maternal Grandfather   . Migraines Mother   . Narcolepsy Mother   . Hypothyroidism Mother   . Asthma Other   . Cancer Other   . Epilepsy Other     SOCIAL HISTORY: Social History   Socioeconomic History  . Marital status: Single    Spouse name:  Not on file  . Number of children: Not on file  . Years of education: Not on file  . Highest education level: Not on file  Occupational History  . Not on file  Social Needs  . Financial resource strain: Not on file  . Food insecurity    Worry: Not on file    Inability: Not on file  . Transportation needs    Medical: Not on file    Non-medical: Not on file  Tobacco Use  . Smoking status: Never Smoker  . Smokeless tobacco: Never Used  Substance and Sexual Activity  . Alcohol use: No  . Drug use: No  . Sexual activity: Not on file  Lifestyle  . Physical activity    Days per week: Not on file  Minutes per session: Not on file  . Stress: Not on file  Relationships  . Social Herbalist on phone: Not on file    Gets together: Not on file    Attends religious service: Not on file    Active member of club or organization: Not on file    Attends meetings of clubs or organizations: Not on file    Relationship status: Not on file  . Intimate partner violence    Fear of current or ex partner: Not on file    Emotionally abused: Not on file    Physically abused: Not on file    Forced sexual activity: Not on file  Other Topics Concern  . Not on file  Social History Narrative   Student at [employer], [place].    Enjoys reading.   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       PHYSICAL EXAM  Vitals:   05/31/19 0847  BP: (!) 140/91  Pulse: 77  Temp: 98 F (36.7 C)  Weight: 144 lb 6.4 oz (65.5 kg)  Height: 5\' 7"  (1.702 m)   Body mass index is 22.62 kg/m.  Generalized: Well developed, in no acute distress  Neurological examination  Mentation: Alert oriented to time, place, history taking. Follows all commands speech and language fluent Cranial nerve II-XII: Pupils were equal round reactive to light. Extraocular movements were full Motor: The motor testing reveals 5 over 5 strength of all 4 extremities. Good symmetric motor tone is noted throughout.    Gait and station: Gait is normal.    DIAGNOSTIC DATA (LABS, IMAGING, TESTING) - I reviewed patient records, labs, notes, testing and imaging myself where available.  No flowsheet data found.   Lab Results  Component Value Date   WBC 7.4 04/27/2018   HGB 11.9 (L) 04/27/2018   HCT 36.8 04/27/2018   MCV 75.0 (L) 04/27/2018   PLT 313.0 04/27/2018      Component Value Date/Time   NA 138 04/27/2018 1014   NA 142 09/28/2017 1459   K 4.0 04/27/2018 1014   CL 105 04/27/2018 1014   CO2 26 04/27/2018 1014   GLUCOSE 98 04/27/2018 1014   BUN 8 04/27/2018 1014   BUN 3 (L) 09/28/2017 1459   CREATININE 0.68 04/27/2018 1014   CALCIUM 9.1 04/27/2018 1014   PROT 7.1 04/27/2018 1014   PROT 6.5 09/28/2017 1459   ALBUMIN 4.0 04/27/2018 1014   ALBUMIN 4.0 09/28/2017 1459   AST 16 04/27/2018 1014   ALT 12 04/27/2018 1014   ALKPHOS 60 04/27/2018 1014   BILITOT 0.4 04/27/2018 1014   BILITOT 0.2 09/28/2017 1459   GFRNONAA >60 12/01/2017 1018   GFRAA >60 12/01/2017 1018   Lab Results  Component Value Date   CHOL 200 04/27/2018   HDL 57.80 04/27/2018   LDLCALC 111 (H) 04/27/2018   TRIG 153.0 (H) 04/27/2018   CHOLHDL 3 04/27/2018   No results found for: HGBA1C No results found for: VITAMINB12 Lab Results  Component Value Date   TSH 1.32 04/27/2018       ASSESSMENT AND PLAN 23 y.o. year old female  has a past medical history of Acne, Angioedema, Aquagenic angio-edema-urticaria, Asthma, Dysautonomia ([place]), Dysrhythmia, Fibroid, Headache(784.0), Pneumonia, and Vasculitis ([place]). here with     ICD-10-CM   1. Chronic migraine without aura without status migrainosus, not intractable  G43.709     Deshae is doing very well from a migraine perspective with the exception the week  of her menstrual cycle.  She reports that this time medications do not typically help her.  We will perform a supra orbital nerve block today per her request.  I have also advised that she go home and take Ubrelvy  100mg  and Benadryl 25mg . Rest for today. She may return to work tomorrow.  I have advised that she start magnesium 400 mg daily OTC as well as naproxen 500 mg twice daily 2 weeks prior to menstrual cycle. Continue throughout cycle then stop. Allergy to ibuprofen documented in chart. Patient is adamant that she can take Aleve with no adverse reactions. She was advised to monitor closely for reaction and stop immediate if any occur. Seek medical attention immediately for any trouble breathing, swallowing, lip or facial swelling. Two days prior to menstrual cycle she may add Ubrelvy 100mg  daily for 5 days.  We will continue topiramate 50 mg at bedtime, Emgality 120mg  monthly as well as propranolol 20 mg daily prescribed by her cardiologist.  I will increase Ubrelvy to 100 mg tablets at onset of migraine.  She may repeat 1 tablet in 2 hours if needed.  No more than 2 tablets in 24 hours or 10 tablets in 1 month. She will follow up with me in 1 year, sooner if needed. She verbalizes understanding and agreement with this plan.    No orders of the defined types were placed in this encounter.    Meds ordered this encounter  Medications  . Ubrogepant (UBRELVY) 100 MG TABS    Sig: Take 100 mg by mouth daily as needed. Take one tablet at onset of headache, may repeat 1 tablet in 2 hours, no more than 2 tablets in 24 hours    Dispense:  10 tablet    Refill:  11    Order Specific Question:   Supervising Provider    Answer:   Melvenia Beam [[id number]]  . naproxen (NAPROSYN) 500 MG tablet    Sig: Take 1 tablet (500 mg total) by mouth every 12 (twelve) hours as needed.    Dispense:  60 tablet    Refill:  2    Order Specific Question:   Supervising Provider    Answer:   Melvenia Beam [id number]      I spent 15 minutes with the patient. 50% of this time was spent counseling and educating patient on plan of care and medications.    Debbora Presto, FNP-C 05/31/2019, 9:56 AM [facility] [address], [place], [place] [address] [phone]

## 2019-05-31 ENCOUNTER — Encounter: Payer: Self-pay | Admitting: Family Medicine

## 2019-05-31 ENCOUNTER — Other Ambulatory Visit: Payer: Self-pay

## 2019-05-31 ENCOUNTER — Ambulatory Visit (INDEPENDENT_AMBULATORY_CARE_PROVIDER_SITE_OTHER): Payer: BC Managed Care – PPO | Admitting: Family Medicine

## 2019-05-31 VITALS — BP 140/91 | HR 77 | Temp 98.0°F | Ht 67.0 in | Wt 144.4 lb

## 2019-05-31 DIAGNOSIS — G43709 Chronic migraine without aura, not intractable, without status migrainosus: Secondary | ICD-10-CM | POA: Diagnosis not present

## 2019-05-31 MED ORDER — UBRELVY 100 MG PO TABS: 100.0000 mg | ORAL_TABLET | Freq: Every day | ORAL | 11 refills | Status: DC | PRN

## 2019-05-31 MED ORDER — NAPROXEN 500 MG PO TABS: 500.0000 mg | ORAL_TABLET | Freq: Two times a day (BID) | ORAL | 2 refills | Status: DC | PRN

## 2019-05-31 NOTE — Progress Notes (Signed)
Nerve block w/o steroid: Pt signed consent  0.5% Bupivocaine 1 mL LOT: [id number] EXP: 05/2020 NDC: 57505-183-35  2% Lidocaine 1 mL LOT: [id number] EXP: 04/2020 NDC: 4210-3128-11

## 2019-05-31 NOTE — Progress Notes (Signed)
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 All procedures as documented were medically necessary, reasonable and appropriate based on the patient's history, medical diagnosis and physician opinion. Verbal informed consent was obtained from the patient, patient was informed of potential risk of procedure, including bruising, bleeding, hematoma formation, infection, muscle weakness, muscle pain, numbness, transient hypertension, transient hyperglycemia and transient insomnia among others. All areas injected were topically clean with isopropyl rubbing alcohol. Nonsterile nonlatex gloves were worn during the procedure.    Supraorbital nerve block (64400): Supraorbital nerve site was identified along the incision of the frontal bone on the orbital/supraorbital ridge. Medication was injected into the left and right supraorbital nerve areas. Patient's condition is associated with inflammation of the supraorbital and associated muscle groups. Injection was deemed medically necessary, reasonable and appropriate. Injection represents a separate and unique surgical service.   The patient tolerated the injections well, no complications of the procedure were noted. Injections were made with a 30-gauge needle.

## 2019-06-08 ENCOUNTER — Other Ambulatory Visit: Payer: Self-pay | Admitting: Cardiovascular Disease

## 2019-06-10 NOTE — Progress Notes (Signed)
Made any corrections needed, and agree with history, physical, neuro exam,assessment and plan as stated.     Antonia Ahern, MD [facility]  

## 2019-06-27 ENCOUNTER — Other Ambulatory Visit: Payer: Self-pay | Admitting: Neurology

## 2019-06-28 ENCOUNTER — Ambulatory Visit (INDEPENDENT_AMBULATORY_CARE_PROVIDER_SITE_OTHER): Payer: BC Managed Care – PPO | Admitting: *Deleted

## 2019-06-28 DIAGNOSIS — L501 Idiopathic urticaria: Secondary | ICD-10-CM

## 2019-07-06 ENCOUNTER — Other Ambulatory Visit: Payer: Self-pay

## 2019-07-06 ENCOUNTER — Ambulatory Visit (INDEPENDENT_AMBULATORY_CARE_PROVIDER_SITE_OTHER): Payer: BC Managed Care – PPO | Admitting: Primary Care

## 2019-07-06 ENCOUNTER — Encounter: Payer: Self-pay | Admitting: Primary Care

## 2019-07-06 ENCOUNTER — Ambulatory Visit (INDEPENDENT_AMBULATORY_CARE_PROVIDER_SITE_OTHER)
Admission: RE | Admit: 2019-07-06 | Discharge: 2019-07-06 | Disposition: A | Discharge status: Home / Self Care | Payer: BC Managed Care – PPO | Source: Ambulatory Visit | Attending: Primary Care | Admitting: Primary Care

## 2019-07-06 DIAGNOSIS — M549 Dorsalgia, unspecified: Secondary | ICD-10-CM | POA: Insufficient documentation

## 2019-07-06 DIAGNOSIS — M5441 Lumbago with sciatica, right side: Secondary | ICD-10-CM | POA: Diagnosis not present

## 2019-07-06 DIAGNOSIS — G8929 Other chronic pain: Secondary | ICD-10-CM

## 2019-07-06 MED ORDER — PREDNISONE 10 MG PO TABS: ORAL_TABLET | ORAL | 0 refills | Status: DC

## 2019-07-06 NOTE — Assessment & Plan Note (Signed)
Chronic for years, worse over last one month with new occupation.   Exam today without alarm signs and overall benign. Given chronic symptoms we will start with plain films of the lumbar spine. Rx for prednisone course given sciatica and no improvement with NSAID's. Referral placed to PT for further management.  She will update.

## 2019-07-06 NOTE — Progress Notes (Signed)
Subjective:    Patient ID: Faith Jordan, female    DOB: 1996/04/29, 23 y.o.   MRN: [id number]  HPI  Faith Jordan is a 23 year old female with a history of neurocardiogenic syncope, chronic migraines, palpitations, anemia, anxiety who presents today with a chief complaint of back pain.  Her pain is located to the right lower back which has been chronic and intermittent for years. Over the past one month she's noticed more frequent pain with radiation down to her right posterior knee. 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 Her pain occurs with standing, sitting, laying down, driving. Her pain initially bothered her when she was about half way through her shift, now she wakes up with the discomfort.  She denies weakness, numbness/tingling. She's tried taking exercising, Aleve, Tylenol, heat/ice. She denies injury/trauma.   Review of Systems  Genitourinary:       Denies changes in bowel/bladder control  Musculoskeletal: Positive for back pain.  Neurological: Negative for weakness and numbness.       Past Medical History:  Diagnosis Date  . Acne   . Angioedema   . Aquagenic angio-edema-urticaria   . Asthma    no problems /not used recently  . Dysautonomia ([place])   . Dysrhythmia    sinus tachycardia  . Fibroid    right breast, adenoma  . Headache(784.0)   . Pneumonia    hx  6th grade  . Vasculitis ([place])      Social History   Socioeconomic History  . Marital status: Single    Spouse name: Not on file  . Number of children: Not on file  . Years of education: Not on file  . Highest education level: Not on file  Occupational History  . Not on file  Social Needs  . Financial resource strain: Not on file  . Food insecurity    Worry: Not on file    Inability: Not on file  . Transportation needs    Medical: Not on file    Non-medical: Not on file  Tobacco Use  . Smoking status: Never Smoker  . Smokeless tobacco: Never Used   Substance and Sexual Activity  . Alcohol use: No  . Drug use: No  . Sexual activity: Not on file  Lifestyle  . Physical activity    Days per week: Not on file    Minutes per session: Not on file  . Stress: Not on file  Relationships  . Social Herbalist on phone: Not on file    Gets together: Not on file    Attends religious service: Not on file    Active member of club or organization: Not on file    Attends meetings of clubs or organizations: Not on file    Relationship status: Not on file  . Intimate partner violence    Fear of current or ex partner: Not on file    Emotionally abused: Not on file    Physically abused: Not on file    Forced sexual activity: Not on file  Other Topics Concern  . Not on file  Social History Narrative   Student at [employer], [place].    Enjoys reading.   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     Past Surgical History:  Procedure Laterality Date  . BREAST LUMPECTOMY Right   . BREAST LUMPECTOMY Right   . MASS EXCISION Right 10/07/2014  Procedure: EXCISION OF RIGHT BREAST MASS;  Surgeon: Jackolyn Confer, MD;  Location: [facility];  Service: General;  Laterality: Right;  . TONSILLECTOMY AND ADENOIDECTOMY    . TOOTH EXTRACTION      Family History  Problem Relation Age of Onset  . Breast cancer Maternal Grandmother 1  . Myasthenia gravis Maternal Grandfather   . Migraines Mother   . Narcolepsy Mother   . Hypothyroidism Mother   . Asthma Other   . Cancer Other   . Epilepsy Other     Allergies  Allergen Reactions  . 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 Nsaids Dermatitis    Reaction similar to Stephens-Johnson, blistering peeling rash  . Sulfa Antibiotics Swelling    Facial swelling    Current Outpatient Medications on File Prior to Visit  Medication Sig Dispense Refill  . ALPRAZolam (XANAX) 0.25 MG tablet Take 0.25 mg by mouth daily as needed for anxiety.     . diphenhydrAMINE (BENADRYL) 25 MG tablet Take 50 mg by mouth daily as needed (Angio Edema). Takes with Zantac    . EMGALITY 120 MG/ML SOAJ INJECT 120 MG INTO THE SKIN EVERY 30 (THIRTY) DAYS. 1 pen 10  . EPINEPHrine 0.3 mg/0.3 mL IJ SOAJ injection Inject into the muscle once.    . Levonorgestrel-Ethinyl Estradiol (AMETHIA) 0.15-0.03 &0.01 MG tablet Take 1 tablet by mouth daily.    . naproxen (NAPROSYN) 500 MG tablet Take 1 tablet (500 mg total) by mouth every 12 (twelve) hours as needed. 60 tablet 2  . promethazine (PHENERGAN) 25 MG tablet Take 25 mg by mouth as directed.    . propranolol (INDERAL) 20 MG tablet Take 1 tablet (20 mg total) by mouth daily. Please make annual appt for future refills. [phone]. 1st attempt. 30 tablet 0  . topiramate (TOPAMAX) 50 MG tablet Take 50 mg by mouth daily.    Marland Kitchen Ubrogepant (UBRELVY) 100 MG TABS Take 100 mg by mouth daily as needed. Take one tablet at onset of headache, may repeat 1 tablet in 2 hours, no more than 2 tablets in 24 hours 10 tablet 11  . XOLAIR 150 MG injection INJECT 300 MG SUBCUTANEOUSLY EVERY 4 WEEKS. 2 each 10   Current Facility-Administered Medications on File Prior to Visit  Medication Dose Route Frequency Provider Last Rate Last Dose  . omalizumab Arvid Right) injection 300 mg  300 mg Subcutaneous Q6 weeks Valentina Shaggy, MD   300 mg at 06/28/19 1815    BP 118/80   Pulse 72   Temp 98 F (36.7 C) (Temporal)   Ht 5\' 7"  (1.702 m)   Wt 143 lb (64.9 kg)   LMP 06/05/2019   SpO2 99%   BMI 22.40 kg/m    Objective:   Physical Exam  Constitutional: She appears well-nourished.  Respiratory: Effort normal.  Musculoskeletal:     Lumbar back: She exhibits pain. She exhibits normal range of motion, no tenderness and no bony tenderness.       Back:  Skin: Skin is warm and dry.           Assessment & Plan:

## 2019-07-06 NOTE — Patient Instructions (Signed)
Start prednisone tablets. Take three tablets for 3 days, then two tablets for 2 days, then one tablet for 2 days.  Complete xray(s) prior to leaving today. I will notify you of your results once received.  You will be contacted regarding your referral to physical therapy.  Please let us know if you have not been contacted within one week.   It was a pleasure to see you today!

## 2019-07-13 NOTE — Telephone Encounter (Signed)
Faith Ferries, do you want me to notify patient to allow another week for a call back for the PT referral?

## 2019-08-09 ENCOUNTER — Ambulatory Visit (INDEPENDENT_AMBULATORY_CARE_PROVIDER_SITE_OTHER): Payer: BC Managed Care – PPO | Admitting: *Deleted

## 2019-08-09 ENCOUNTER — Other Ambulatory Visit: Payer: Self-pay

## 2019-08-09 DIAGNOSIS — L500 Allergic urticaria: Secondary | ICD-10-CM

## 2019-08-16 ENCOUNTER — Other Ambulatory Visit: Payer: Self-pay | Admitting: Cardiovascular Disease

## 2019-09-04 ENCOUNTER — Ambulatory Visit: Payer: Self-pay | Admitting: Allergy & Immunology

## 2019-09-11 ENCOUNTER — Other Ambulatory Visit: Payer: Self-pay

## 2019-09-11 ENCOUNTER — Ambulatory Visit (INDEPENDENT_AMBULATORY_CARE_PROVIDER_SITE_OTHER): Payer: BC Managed Care – PPO | Admitting: Allergy & Immunology

## 2019-09-11 ENCOUNTER — Encounter: Payer: Self-pay | Admitting: Allergy & Immunology

## 2019-09-11 ENCOUNTER — Ambulatory Visit: Payer: BC Managed Care – PPO | Admitting: Orthopaedic Surgery

## 2019-09-11 VITALS — BP 108/70 | HR 72 | Temp 98.0°F | Resp 16 | Ht 66.0 in | Wt 145.6 lb

## 2019-09-11 DIAGNOSIS — L500 Allergic urticaria: Secondary | ICD-10-CM | POA: Diagnosis not present

## 2019-09-11 NOTE — Patient Instructions (Addendum)
1. Allergic urticaria - We are going to space out the Xolair to every 8 weeks at this point to see how you do.  - Epinephrine auto-injector refilled today.  2. Return in about 6 months (around 03/10/2020). This can be an in-person, a virtual Webex or a telephone follow up visit.   Please inform us of any Emergency Department visits, hospitalizations, or changes in symptoms. Call us before going to the ED for breathing or allergy symptoms since we might be able to fit you in for a sick visit. Feel free to contact us anytime with any questions, problems, or concerns.  It was a pleasure to see you again today!  Websites that have reliable patient information: 1. American Academy of Asthma, Allergy, and Immunology: www.aaaai.org 2. Food Allergy Research and Education (FARE): foodallergy.org 3. Mothers of Asthmatics: http://www.asthmacommunitynetwork.org 4. American College of Allergy, Asthma, and Immunology: www.acaai.org  "Like" [place] on Facebook and Instagram for our latest updates!      Make sure you are registered to vote! If you have moved or changed any of your contact information, you will need to get this updated before voting!  In some cases, you MAY be able to register to vote online: [email or web address]

## 2019-09-11 NOTE — Progress Notes (Signed)
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 Allergic urticaria - We are going to space out the Xolair to every 8 weeks at this point to see how you do.  - Epinephrine auto-injector refilled today.  2. Return in about 6 months (around 03/10/2020). This can be an in-person, a virtual Webex or a telephone follow up visit.  Subjective:   Faith Jordan is a 23 y.o. female presenting today for follow up of  Chief Complaint  Patient presents with  . Urticaria    hives are better since starting [place] has a history of the following: Patient Active Problem List   Diagnosis Date Noted  . Chronic back pain 07/06/2019  . Urinary frequency 02/12/2019  . Preventative health care 04/27/2018  . Migraines 04/25/2018  . 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 Anemia 01/04/2018  . Angio-edema 04/27/2016  . Headache disorder 12/29/2015  . Acquired breast deformity 12/22/2015  . 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  . Sinus tachycardia 12/22/2015  . Hives 12/22/2015  . Dysautonomia ([place]) 12/22/2015  . 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 Neurocardiogenic syncope 04/06/2013  . Intermittent palpitations 04/06/2013    History obtained from: chart review and patient.  Faith Jordan is a 23 y.o. female presenting for a follow up visit.  She was last seen in May 2020.  At that time, we decided to space out her Xolair for her chronic idiopathic urticaria.  She was very open to this.  She has the presence of antithyroid antibodies and has been evaluated by endocrinology.  There was no concern for hypothyroidism.  She has not had any problems with six weeks at all.  She is open to spacing this out even further.  She clearly does not want to have any breakouts, but she has remained  symptom-free for 6 months at this point.  She has had no adverse reactions to the Xolair.  She was having fainting episodes at one point, but these have even improved.  She has received her last 2 injections without any reactions while sitting in the chair.  Work is going well.  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  She has been around several COVID-19 positive patients.  She has not caught it herself thankfully.  She is going to get the vaccine, but is planning to wait some time before doing that.  Otherwise, there have been no changes to her past medical history, surgical history, family history, or social history.    Review of Systems  Constitutional: Negative.  Negative for chills, fever, malaise/fatigue and weight loss.  HENT: Negative.  Negative for congestion, ear discharge, ear pain and sore throat.   Eyes: Negative for pain, discharge and redness.  Respiratory: Negative for cough, sputum production, shortness of breath and wheezing.   Cardiovascular: Negative.  Negative for chest pain and palpitations.  Gastrointestinal: Negative for abdominal pain, constipation, diarrhea, heartburn, nausea and vomiting.  Skin: Negative.  Negative for itching and rash.  Neurological: Negative for dizziness and headaches.  Endo/Heme/Allergies: Negative for environmental allergies. Does not bruise/bleed easily.       Objective:   Blood pressure 108/70, pulse 72, temperature 98 F (36.7 C), temperature source Temporal, resp. rate 16, height 5\' 6"  (1.676 m), weight 145 lb  9.6 oz (66 kg), SpO2 99 %. Body mass index is 23.5 kg/m.   Physical Exam:  Physical Exam  Constitutional: She appears well-developed.  HENT:  Head: Normocephalic and atraumatic.  Right Ear: Tympanic membrane, external ear and ear canal normal.  Left Ear: Tympanic membrane and ear canal normal.  Nose: No mucosal edema, rhinorrhea, nasal deformity or septal deviation. No epistaxis. Right  sinus exhibits no maxillary sinus tenderness and no frontal sinus tenderness. Left sinus exhibits no maxillary sinus tenderness and no frontal sinus tenderness.  Mouth/Throat: Uvula is midline and oropharynx is clear and moist. Mucous membranes are not pale and not dry.  Eyes: Pupils are equal, round, and reactive to light. Conjunctivae and EOM are normal. Right eye exhibits no chemosis and no discharge. Left eye exhibits no chemosis and no discharge. Right conjunctiva is not injected. Left conjunctiva is not injected.  Cardiovascular: Normal rate, regular rhythm and normal heart sounds.  Respiratory: Effort normal and breath sounds normal. No accessory muscle usage. No tachypnea. No respiratory distress. She has no wheezes. She has no rhonchi. She has no rales. She exhibits no tenderness.  Lymphadenopathy:    She has no cervical adenopathy.  Neurological: She is alert.  Skin: No abrasion, no petechiae and no rash noted. Rash is not papular, not vesicular and not urticarial. No erythema. No pallor.  No urticarial or eczematous lesions noted.  Psychiatric: She has a normal mood and affect.     Diagnostic studies: none     Salvatore Marvel, MD  All[facility] and [place] [facility]

## 2019-09-12 ENCOUNTER — Encounter: Payer: Self-pay | Admitting: Orthopaedic Surgery

## 2019-09-12 ENCOUNTER — Ambulatory Visit (INDEPENDENT_AMBULATORY_CARE_PROVIDER_SITE_OTHER): Payer: BC Managed Care – PPO | Admitting: Cardiovascular Disease

## 2019-09-12 ENCOUNTER — Ambulatory Visit (INDEPENDENT_AMBULATORY_CARE_PROVIDER_SITE_OTHER): Payer: BC Managed Care – PPO | Admitting: Orthopaedic Surgery

## 2019-09-12 ENCOUNTER — Encounter: Payer: Self-pay | Admitting: Cardiovascular Disease

## 2019-09-12 VITALS — BP 110/70 | HR 71 | Ht 66.0 in | Wt 142.0 lb

## 2019-09-12 DIAGNOSIS — G8929 Other chronic pain: Secondary | ICD-10-CM | POA: Diagnosis not present

## 2019-09-12 DIAGNOSIS — R002 Palpitations: Secondary | ICD-10-CM | POA: Diagnosis not present

## 2019-09-12 DIAGNOSIS — M5441 Lumbago with sciatica, right side: Secondary | ICD-10-CM | POA: Diagnosis not present

## 2019-09-12 DIAGNOSIS — R000 Tachycardia, unspecified: Secondary | ICD-10-CM | POA: Diagnosis not present

## 2019-09-12 MED ORDER — PROPRANOLOL HCL 20 MG PO TABS: 20.0000 mg | ORAL_TABLET | Freq: Every day | ORAL | 0 refills | Status: DC

## 2019-09-12 NOTE — Assessment & Plan Note (Signed)
Ms. Faith Jordan returns today for follow-up of dysautonomia and inappropriate sinus tachycardia.  She is on propranolol.  I referred her to Dr. Caryl Comes who recommended hydration and "spanks".  Since I saw her last she seems to have "outgrown" her symptoms.  She no longer has tachypalpitations or inappropriate tachycardia.  She graduated from [employer] and is working as a Marine scientist at [employer].  She is relatively asymptomatic and exercises as well.

## 2019-09-12 NOTE — Progress Notes (Signed)
Office Visit Note   Patient: Faith Jordan           Date of Birth: [birth year]           MRN: [id number] Visit Date: 09/12/2019              Requested by: Pleas Koch, NP [place],  [place] [address] PCP: Pleas Koch, NP   Assessment & Plan: Visit Diagnoses:  1. Chronic right-sided low back pain with right-sided sciatica     Plan: Given the patient's continued radicular symptoms combined with the failure of rest, ice, heat, anti-inflammatories, steroid taper and formal physical therapy, a MRI of her lumbar spine at this point is medically warranted to rule out nerve compression.  We had a long thorough discussion about this.  I went over x-rays with her and her clinical exam findings.  I do feel this is medically warranted based on everything that we just set in her exam.  All question concerns were answered addressed.  We will see her in follow-up once we have an MRI of her lumbar spine.  Follow-Up Instructions: No follow-ups on file.   Orders:  No orders of the defined types were placed in this encounter.  No orders of the defined types were placed in this encounter.     Procedures: No procedures performed   Clinical Data: No additional findings.   Subjective: Chief Complaint  Patient presents with  . Lower Back - Pain  Patient is on sling for the first time.  She is a 23 year old female with right-sided low back pain that now has a radicular component with pain radiating into her sciatic region on the right side all the way to her knee.  This is been going on since July of this year.  She is already been through a steroid taper which did help.  She is tried anti-inflammatories.  She is worked on rest and activity modification.  She has been through 10 weeks of formal physical therapy.  Her symptoms are still persistent and seem to be worsening.  She is not a diabetic.  She is never had issues like this before.  She denies any radicular  components on the left side and only radiates down to her knee on the right side.  She has not a smoker.  She works as a Marine scientist.  She says it used to hurt just with flexion extension but now hurts when she is sitting and laying down.  HPI  Review of Systems She currently denies any headache, chest pain, shortness of breath, fever, chills, nausea, vomiting  Objective: Vital Signs: There were no vitals taken for this visit.  Physical Exam She is alert and orient x3 and in no acute distress Ortho Exam On examination she has limited flexion and extension of her lumbar spine.  She can only touch down to her ankles.  She has pain with hyperextension of her lumbar spine.  She does have a positive straight leg raise to the right side.  She has excellent strength in her bilateral lower extremities.  Her reflexes are normal. Specialty Comments:  No specialty comments available.  Imaging: No results found. 2 views of the lumbar spine that are seen and reviewed independently on the canopy system shows just some slight scoliosis but otherwise no acute findings.  PMFS History: Patient Active Problem List   Diagnosis Date Noted  . Chronic back pain 07/06/2019  . Urinary frequency 02/12/2019  .  Preventative health care 04/27/2018  . Migraines 04/25/2018  . 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 Anemia 01/04/2018  . Angio-edema 04/27/2016  . Headache disorder 12/29/2015  . Acquired breast deformity 12/22/2015  . 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  . Sinus tachycardia 12/22/2015  . Hives 12/22/2015  . Dysautonomia ([place]) 12/22/2015  . 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 Neurocardiogenic syncope 04/06/2013  . Intermittent palpitations 04/06/2013   Past Medical History:  Diagnosis Date  . Acne   . Angioedema   . Aquagenic angio-edema-urticaria   . Asthma    no problems /not used recently  . Dysautonomia ([place])   .  Dysrhythmia    sinus tachycardia  . Fibroid    right breast, adenoma  . Headache(784.0)   . Pneumonia    hx  6th grade  . Vasculitis ([place])     Family History  Problem Relation Age of Onset  . Breast cancer Maternal Grandmother 31  . Myasthenia gravis Maternal Grandfather   . Migraines Mother   . Narcolepsy Mother   . Hypothyroidism Mother   . Asthma Other   . Cancer Other   . Epilepsy Other     Past Surgical History:  Procedure Laterality Date  . ADENOIDECTOMY    . BREAST LUMPECTOMY Right   . BREAST LUMPECTOMY Right   . MASS EXCISION Right 10/07/2014   Procedure: EXCISION OF RIGHT BREAST MASS;  Surgeon: Jackolyn Confer, MD;  Location: [facility];  Service: General;  Laterality: Right;  . TONSILLECTOMY AND ADENOIDECTOMY    . TOOTH EXTRACTION     Social History   Occupational History  . Not on file  Tobacco Use  . Smoking status: Never Smoker  . Smokeless tobacco: Never Used  Substance and Sexual Activity  . Alcohol use: No  . Drug use: No  . Sexual activity: Not on file

## 2019-09-12 NOTE — Progress Notes (Signed)
09/12/2019 Faith Jordan   1996-04-01  [id number]  Primary Physician Faith Koch, NP Primary Cardiologist: Faith Harp MD Faith Jordan, [place]  HPI:  Faith Jordan is a 23 y.o.   fit appearing single Caucasian female who is transitioning her care from her pediatric cardiologist, Dr. Darcus Jordan at [facility], to myself. She isAccompanied byher mother who is a patient of mine as well. I last saw her in the office  08/26/2017. 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 She was diagnosed with dysautonomia to6years ago because of inappropriate sinus tachycardia. 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 When I saw her 6 months ago I increased her propranolol from 20-40 mg a day extended release. Since that time she's noticed some nightmares which has affected her sleeping pattern. Her heart rate continues to be in the high 90s.  I did refer her to Dr. Caryl Jordan for EP evaluation.  He saw her on 02/14/2018 and agreed that she had dysautonomia and made some recommendations regarding rehydration and ongoing exercise.  Since I saw her a year ago her dizzy episodes, and inappropriate tachycardia have improved.  She remains on propanolol.  She has since graduated from [place] and is working as a Marine scientist at [employer].  .   Current Meds  Medication Sig  . ALPRAZolam (XANAX) 0.25 MG tablet Take 0.25 mg by mouth daily as needed for anxiety.  Marland Kitchen EMGALITY 120 MG/ML SOAJ INJECT 120 MG INTO THE SKIN EVERY 30 (THIRTY) DAYS.  Marland Kitchen EPINEPHrine 0.3 mg/0.3 mL IJ SOAJ injection Inject into the muscle once.  . Levonorgestrel-Ethinyl Estradiol (AMETHIA) 0.15-0.03 &0.01 MG tablet Take 1 tablet by mouth daily.  . naproxen (NAPROSYN) 500 MG tablet Take 1 tablet (500 mg total) by mouth every 12 (twelve) hours as needed.  . propranolol (INDERAL) 20  MG tablet Take 1 tablet (20 mg total) by mouth daily. Please make annual appt for future refills. [phone]. 1st attempt.  . topiramate (TOPAMAX) 50 MG tablet Take 50 mg by mouth daily.  Marland Kitchen Ubrogepant (UBRELVY) 100 MG TABS Take 100 mg by mouth daily as needed. Take one tablet at onset of headache, may repeat 1 tablet in 2 hours, no more than 2 tablets in 24 hours  . XOLAIR 150 MG injection INJECT 300 MG SUBCUTANEOUSLY EVERY 4 WEEKS.  . [DISCONTINUED] propranolol (INDERAL) 20 MG tablet Take 1 tablet (20 mg total) by mouth daily. Please make annual appt for future refills. [phone]. 1st attempt.     Allergies  Allergen Reactions  . 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 Nsaids Dermatitis    Reaction similar to Stephens-Johnson, blistering peeling rash  . Sulfa Antibiotics Swelling    Facial swelling    Social History   Socioeconomic History  . Marital status: Single    Spouse name: Not on file  . Number of children: Not on file  . Years of education: Not on file  . Highest education level: Not on file  Occupational History  . Not on file  Social Needs  . Financial resource strain: Not on file  . Food insecurity    Worry: Not on file    Inability: Not on file  . Transportation needs    Medical: Not on file  Non-medical: Not on file  Tobacco Use  . Smoking status: Never Smoker  . Smokeless tobacco: Never Used  Substance and Sexual Activity  . Alcohol use: No  . Drug use: No  . Sexual activity: Not on file  Lifestyle  . Physical activity    Days per week: Not on file    Minutes per session: Not on file  . Stress: Not on file  Relationships  . Social Herbalist on phone: Not on file    Gets together: Not on file    Attends religious service: Not on file    Active member of club or organization: Not on file    Attends meetings of clubs or organizations: Not on file    Relationship status: Not on  file  . Intimate partner violence    Fear of current or ex partner: Not on file    Emotionally abused: Not on file    Physically abused: Not on file    Forced sexual activity: Not on file  Other Topics Concern  . Not on file  Social History Narrative   Student at [employer], [place].    Enjoys reading.   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      Review of Systems: General: negative for chills, fever, night sweats or weight changes.  Cardiovascular: negative for chest pain, dyspnea on exertion, edema, orthopnea, palpitations, paroxysmal nocturnal dyspnea or shortness of breath Dermatological: negative for rash Respiratory: negative for cough or wheezing Urologic: negative for hematuria Abdominal: negative for nausea, vomiting, diarrhea, bright red blood per rectum, melena, or hematemesis Neurologic: negative for visual changes, syncope, or dizziness All other systems reviewed and are otherwise negative except as noted above.    Blood pressure 110/70, pulse 71, height 5\' 6"  (1.676 m), weight 142 lb (64.4 kg), SpO2 99 %.  General appearance: alert and no distress Neck: no adenopathy, no carotid bruit, no JVD, supple, symmetrical, trachea midline and thyroid not enlarged, symmetric, no tenderness/mass/nodules Lungs: clear to auscultation bilaterally Heart: regular rate and rhythm, S1, S2 normal, no murmur, click, rub or gallop Extremities: extremities normal, atraumatic, no cyanosis or edema Pulses: 2+ and symmetric Skin: Skin color, texture, turgor normal. No rashes or lesions Neurologic: Alert and oriented X 3, normal strength and tone. Normal symmetric reflexes. Normal coordination and gait  EKG sinus rhythm at 71 without ST or T wave changes.  I personally reviewed this EKG.  ASSESSMENT AND PLAN:   Sinus tachycardia Ms. Faith Jordan returns today for follow-up of dysautonomia and inappropriate sinus tachycardia.  She is on propranolol.  I referred her to Dr. Caryl Jordan who  recommended hydration and "spanks".  Since I saw her last she seems to have "outgrown" her symptoms.  She no longer has tachypalpitations or inappropriate tachycardia.  She graduated from [place] and is working as a Marine scientist at [employer].  She is relatively asymptomatic and exercises as well.      Faith Harp MD FACP,FACC,FAHA, [facility] 09/12/2019 11:59 AM

## 2019-09-12 NOTE — Patient Instructions (Signed)
Medication Instructions:  Your physician recommends that you continue on your current medications as directed. Please refer to the Current Medication list given to you today.  If you need a refill on your cardiac medications before your next appointment, please call your pharmacy.   Lab work: NONE  Testing/Procedures: NONE  Follow-Up: At [facility], you and your health needs are our priority.  As part of our continuing mission to provide you with exceptional heart care, we have created designated Provider Care Teams.  These Care Teams include your primary Cardiologist (physician) and Advanced Practice Providers (APPs -  Physician Assistants and Nurse Practitioners) who all work together to provide you with the care you need, when you need it. You may see Dr Berry or one of the following Advanced Practice Providers on your designated Care Team:    Luke Kilroy, PA-C  Callie Goodrich, PA-C  Jesse Cleaver, FNP Your physician wants you to follow-up as needed.     

## 2019-09-14 ENCOUNTER — Other Ambulatory Visit: Payer: Self-pay | Admitting: Cardiovascular Disease

## 2019-09-18 ENCOUNTER — Other Ambulatory Visit: Payer: Self-pay

## 2019-09-18 ENCOUNTER — Encounter: Payer: Self-pay | Admitting: Orthopaedic Surgery

## 2019-09-18 DIAGNOSIS — M4807 Spinal stenosis, lumbosacral region: Secondary | ICD-10-CM

## 2019-09-19 ENCOUNTER — Ambulatory Visit: Payer: Self-pay

## 2019-09-24 ENCOUNTER — Other Ambulatory Visit: Payer: Self-pay | Admitting: Allergy & Immunology

## 2019-10-05 ENCOUNTER — Ambulatory Visit
Admission: RE | Admit: 2019-10-05 | Discharge: 2019-10-05 | Disposition: A | Discharge status: Home / Self Care | Payer: BC Managed Care – PPO | Source: Ambulatory Visit | Attending: Orthopaedic Surgery | Admitting: Orthopaedic Surgery

## 2019-10-05 ENCOUNTER — Ambulatory Visit (INDEPENDENT_AMBULATORY_CARE_PROVIDER_SITE_OTHER): Payer: BC Managed Care – PPO | Admitting: *Deleted

## 2019-10-05 ENCOUNTER — Other Ambulatory Visit: Payer: Self-pay

## 2019-10-05 DIAGNOSIS — L500 Allergic urticaria: Secondary | ICD-10-CM

## 2019-10-05 DIAGNOSIS — M4807 Spinal stenosis, lumbosacral region: Secondary | ICD-10-CM | POA: Insufficient documentation

## 2019-10-05 MED ORDER — OMALIZUMAB 150 MG ~~LOC~~ SOLR
300.0000 mg | Freq: Once | SUBCUTANEOUS | Status: AC
  Administered 2019-10-05: 300 mg via SUBCUTANEOUS

## 2019-10-08 ENCOUNTER — Ambulatory Visit (INDEPENDENT_AMBULATORY_CARE_PROVIDER_SITE_OTHER): Payer: BC Managed Care – PPO | Admitting: Orthopaedic Surgery

## 2019-10-08 ENCOUNTER — Other Ambulatory Visit: Payer: Self-pay

## 2019-10-08 ENCOUNTER — Encounter: Payer: Self-pay | Admitting: Orthopaedic Surgery

## 2019-10-08 DIAGNOSIS — M5441 Lumbago with sciatica, right side: Secondary | ICD-10-CM

## 2019-10-08 DIAGNOSIS — G8929 Other chronic pain: Secondary | ICD-10-CM

## 2019-10-08 DIAGNOSIS — M4807 Spinal stenosis, lumbosacral region: Secondary | ICD-10-CM

## 2019-10-08 MED ORDER — METHYLPREDNISOLONE 4 MG PO TABS: ORAL_TABLET | ORAL | 0 refills | Status: DC

## 2019-10-08 MED ORDER — MELOXICAM 15 MG PO TABS: 15.0000 mg | ORAL_TABLET | Freq: Every day | ORAL | 0 refills | Status: DC

## 2019-10-08 NOTE — Progress Notes (Signed)
The patient comes in today to go over an MRI of her lumbar spine.  She has been having right-sided low back pain and sciatic symptoms that radiates down her leg for many months now.  She went through 10 weeks of physical therapy.  A steroid taper did help once.  She then had the pain come back.  After the failure of all modalities of physical therapy and other conservative treatment measures combined with naproxen and activity modification, we sent her for a MRI of her lumbar spine to see if there was anything that would shed light on why she hurts the way that she does.  She denies any change in bowel or bladder function or weakness in her legs.  There is no numbness and tingling in her feet.  She is petite.  She works as a Marine scientist.  It still been the same pain that she has had all along.  On exam she hurts mainly in the low back to the right side in the sciatic region.  She is got good strength in her lower extremities and her hip exam on the right side is normal.  I did pull up the MRI with her and we looked at it together as well as the radiology report.  The MRI of her lumbar spine is essentially normal.  I would like to try 1 more steroid taper and meloxicam.  I will send her to Dr. Ernestina Patches for mainly a consultation visit first to see if there is the possibility for an intervention in her lumbar spine.  She is asking about this type of consultation as well.  We will see about her seeing him sometime in the next month or so.  All question concerns were answered and addressed.

## 2019-10-09 ENCOUNTER — Other Ambulatory Visit: Payer: Self-pay

## 2019-10-09 ENCOUNTER — Encounter: Payer: Self-pay | Admitting: Orthopaedic Surgery

## 2019-10-09 DIAGNOSIS — G8929 Other chronic pain: Secondary | ICD-10-CM

## 2019-10-09 DIAGNOSIS — M4807 Spinal stenosis, lumbosacral region: Secondary | ICD-10-CM

## 2019-10-09 DIAGNOSIS — M5441 Lumbago with sciatica, right side: Secondary | ICD-10-CM

## 2019-10-23 ENCOUNTER — Ambulatory Visit: Payer: BC Managed Care – PPO | Admitting: Internal Medicine

## 2019-10-31 ENCOUNTER — Other Ambulatory Visit: Payer: Self-pay | Admitting: Orthopaedic Surgery

## 2019-11-06 ENCOUNTER — Encounter: Payer: Self-pay | Admitting: Primary Care

## 2019-11-06 ENCOUNTER — Other Ambulatory Visit: Payer: Self-pay

## 2019-11-06 ENCOUNTER — Ambulatory Visit (INDEPENDENT_AMBULATORY_CARE_PROVIDER_SITE_OTHER): Payer: BC Managed Care – PPO | Admitting: Primary Care

## 2019-11-06 DIAGNOSIS — E538 Deficiency of other specified B group vitamins: Secondary | ICD-10-CM

## 2019-11-06 DIAGNOSIS — R5383 Other fatigue: Secondary | ICD-10-CM | POA: Diagnosis not present

## 2019-11-06 LAB — COMPREHENSIVE METABOLIC PANEL
ALT: 9 U/L (ref 0–35)
AST: 11 U/L (ref 0–37)
Albumin: 4.2 g/dL (ref 3.5–5.2)
Alkaline Phosphatase: 43 U/L (ref 39–117)
BUN: 10 mg/dL (ref 6–23)
CO2: 21 mEq/L (ref 19–32)
Calcium: 9 mg/dL (ref 8.4–10.5)
Chloride: 106 mEq/L (ref 96–112)
Creatinine, Ser: 0.82 mg/dL (ref 0.40–1.20)
GFR: 85.75 mL/min (ref >60.00)
Glucose, Bld: 109 mg/dL — ABNORMAL HIGH (ref 70–99)
Potassium: 3.6 mEq/L (ref 3.5–5.1)
Sodium: 137 mEq/L (ref 135–145)
Total Bilirubin: 0.5 mg/dL (ref 0.2–1.2)
Total Protein: 6.7 g/dL (ref 6.0–8.3)

## 2019-11-06 LAB — CBC
HCT: 41.1 % (ref 36.0–46.0)
Hemoglobin: 13.6 g/dL (ref 12.0–15.0)
MCHC: 33.1 g/dL (ref 30.0–36.0)
MCV: 88.5 fl (ref 78.0–100.0)
Platelets: 284 10*3/uL (ref 150.0–400.0)
RBC: 4.64 Mil/uL (ref 3.87–5.11)
RDW: 13.7 % (ref 11.5–15.5)
WBC: 8.9 10*3/uL (ref 4.0–10.5)

## 2019-11-06 LAB — IBC + FERRITIN
Ferritin: 4.7 ng/mL — ABNORMAL LOW (ref 10.0–291.0)
Iron: 83 ug/dL (ref 42–145)
Saturation Ratios: 15.3 % — ABNORMAL LOW (ref 20.0–50.0)
Transferrin: 388 mg/dL — ABNORMAL HIGH (ref 212.0–360.0)

## 2019-11-06 LAB — TSH: TSH: 1.58 u[IU]/mL (ref 0.35–4.50)

## 2019-11-06 LAB — VITAMIN B12: Vitamin B-12: 152 pg/mL — ABNORMAL LOW (ref 211–911)

## 2019-11-06 LAB — VITAMIN D 25 HYDROXY (VIT D DEFICIENCY, FRACTURES): VITD: 16.28 ng/mL — ABNORMAL LOW (ref 30.00–100.00)

## 2019-11-06 NOTE — Assessment & Plan Note (Signed)
Likely secondary to altered shift work pattern, switching back and forth from afternoons to evenings to nights.   Discussed to have a discussion with her direct report regarding chronic symptoms. Also recommended to avoid oversleeping on days off, avoid use of black out curtains when needing to shift to a normal day schedule, start regular exercise.  Check labs today to rule out other causes, TSH, CBC, CMP, B12, vitamin D.

## 2019-11-06 NOTE — Progress Notes (Signed)
Subjective:    Patient ID: Faith Jordan, female    DOB: Mar 27, 1996, 24 y.o.   MRN: [id number]  HPI  This visit occurred during the SARS-CoV-2 public health emergency.  Safety protocols were in place, including screening questions prior to the visit, additional usage of staff PPE, and extensive cleaning of exam room while observing appropriate contact time as indicated for disinfecting solutions.   Ms. Faith Jordan is a 24 year old female with a history of neurocardiogenic syncope, migraines, dysautonomia, palpitations, anemia, sinus tachycardia who presents today with a chief complaint of fatigue.  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 She will work 11a-11p, 3p-3a, 7p-7a depending on the departments needs. She does have to take Melatonin at times to fall asleep, especially when having one day off in between shifts. During her days off she'll sleep over 12 hours and feel tired.   She is not exercising, walks a lot at work. She denies feeling down/depressed, doesn't feel like doing anything after work because she's so tired.   Review of Systems  Constitutional: Positive for fatigue.  Respiratory: Negative for shortness of breath.   Cardiovascular: Negative for chest pain.  Neurological: Negative for dizziness.  Psychiatric/Behavioral:       Altered sleep schedule        Past Medical History:  Diagnosis Date  . Acne   . Angioedema   . Aquagenic angio-edema-urticaria   . Asthma    no problems /not used recently  . Dysautonomia ([place])   . Dysrhythmia    sinus tachycardia  . Fibroid    right breast, adenoma  . Headache(784.0)   . Pneumonia    hx  6th grade  . Vasculitis ([place])      Social History   Socioeconomic History  . Marital status: Single    Spouse name: Not on file  . Number of children: Not on file  . Years of education: Not on file  .  Highest education level: Not on file  Occupational History  . Not on file  Tobacco Use  . Smoking status: Never Smoker  . Smokeless tobacco: Never Used  Substance and Sexual Activity  . Alcohol use: No  . Drug use: No  . Sexual activity: Not on file  Other Topics Concern  . Not on file  Social History Narrative   Student at [employer], [place].    Enjoys reading.   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    Social Determinants of Health   Financial Resource Strain:   . Difficulty of Paying Living Expenses: Not on file  Food Insecurity:   . Worried About Charity fundraiser in the Last Year: Not on file  . Ran Out of Food in the Last Year: Not on file  Transportation Needs:   . Lack of Transportation (Medical): Not on file  . Lack of Transportation (Non-Medical): Not on file  Physical Activity:   . Days of Exercise per Week: Not on file  . Minutes of Exercise per Session: Not on file  Stress:   . Feeling of Stress : Not on file  Social Connections:   . Frequency of Communication with Friends and Family: Not on file  . Frequency of Social Gatherings with Friends and Family: Not on file  . Attends Religious Services: Not on file  . Active Member of Clubs or  Organizations: Not on file  . Attends Archivist Meetings: Not on file  . Marital Status: Not on file  Intimate Partner Violence:   . Fear of Current or Ex-Partner: Not on file  . Emotionally Abused: Not on file  . Physically Abused: Not on file  . Sexually Abused: Not on file    Past Surgical History:  Procedure Laterality Date  . ADENOIDECTOMY    . BREAST LUMPECTOMY Right   . BREAST LUMPECTOMY Right   . MASS EXCISION Right 10/07/2014   Procedure: EXCISION OF RIGHT BREAST MASS;  Surgeon: Jackolyn Confer, MD;  Location: [facility];  Service: General;  Laterality: Right;  . TONSILLECTOMY AND ADENOIDECTOMY    . TOOTH EXTRACTION      Family History  Problem Relation Age of Onset  . Breast cancer  Maternal Grandmother 46  . Myasthenia gravis Maternal Grandfather   . Migraines Mother   . Narcolepsy Mother   . Hypothyroidism Mother   . Asthma Other   . Cancer Other   . Epilepsy Other     Allergies  Allergen Reactions  . 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 Nsaids Dermatitis    Reaction similar to Stephens-Johnson, blistering peeling rash  . Sulfa Antibiotics Swelling    Facial swelling    Current Outpatient Medications on File Prior to Visit  Medication Sig Dispense Refill  . ALPRAZolam (XANAX) 0.25 MG tablet Take 0.25 mg by mouth daily as needed for anxiety.    Marland Kitchen EMGALITY 120 MG/ML SOAJ INJECT 120 MG INTO THE SKIN EVERY 30 (THIRTY) DAYS. 1 pen 10  . EPINEPHrine 0.3 mg/0.3 mL IJ SOAJ injection Inject into the muscle once.    . Levonorgestrel-Ethinyl Estradiol (AMETHIA) 0.15-0.03 &0.01 MG tablet Take 1 tablet by mouth daily.    . meloxicam (MOBIC) 15 MG tablet TAKE 1 TABLET BY MOUTH EVERY DAY 30 tablet 0  . methylPREDNISolone (MEDROL) 4 MG tablet Medrol dose pack. Take as instructed 21 tablet 0  . naproxen (NAPROSYN) 500 MG tablet Take 1 tablet (500 mg total) by mouth every 12 (twelve) hours as needed. 60 tablet 2  . propranolol (INDERAL) 20 MG tablet TAKE 1 TABLET BY MOUTH EVERY DAY MUST MAKE APPT FOR REFILLS 30 tablet 11  . topiramate (TOPAMAX) 50 MG tablet Take 50 mg by mouth daily.    Marland Kitchen Ubrogepant (UBRELVY) 100 MG TABS Take 100 mg by mouth daily as needed. Take one tablet at onset of headache, may repeat 1 tablet in 2 hours, no more than 2 tablets in 24 hours 10 tablet 11  . XOLAIR 150 MG injection INJECT 300 MG SUBCUTANEOUSLY EVERY 4 WEEKS. 2 each 10   No current facility-administered medications on file prior to visit.    BP 116/72   Pulse 80   Temp (!) 96.4 F (35.8 C) (Temporal)   Ht 5\' 6"  (1.676 m)   Wt 140 lb (63.5 kg)   SpO2 100%   BMI 22.60 kg/m    Objective:   Physical Exam  Constitutional:  She appears well-nourished.  Cardiovascular: Normal rate and regular rhythm.  Respiratory: Effort normal and breath sounds normal.  Musculoskeletal:     Cervical back: Neck supple.  Skin: Skin is warm and dry.  Psychiatric: She has a normal mood and affect.           Assessment & Plan:

## 2019-11-06 NOTE — Patient Instructions (Addendum)
Stop by the lab prior to leaving today. I will notify you of your results once received.   Work on a healthy diet, plenty of water consumption.  Avoid oversleeping on days off.  Avoid black out curtains when switching back to a normal schedule. Discuss symptoms with your direct report as discussed.  It was a pleasure to see you today!

## 2019-11-07 MED ORDER — "SYRINGE/NEEDLE (DISP) 23G X 1"" 1 ML MISC": 0 refills | Status: DC

## 2019-11-07 MED ORDER — CYANOCOBALAMIN 1000 MCG/ML IJ SOLN: INTRAMUSCULAR | 0 refills | Status: DC

## 2019-11-16 ENCOUNTER — Other Ambulatory Visit: Payer: Self-pay

## 2019-11-16 ENCOUNTER — Ambulatory Visit (INDEPENDENT_AMBULATORY_CARE_PROVIDER_SITE_OTHER): Payer: BC Managed Care – PPO | Admitting: *Deleted

## 2019-11-16 DIAGNOSIS — L500 Allergic urticaria: Secondary | ICD-10-CM

## 2019-11-16 MED ORDER — OMALIZUMAB 150 MG ~~LOC~~ SOLR
300.0000 mg | SUBCUTANEOUS | Status: DC
  Administered 2019-11-16: 300 mg via SUBCUTANEOUS

## 2019-11-27 ENCOUNTER — Other Ambulatory Visit: Payer: Self-pay | Admitting: Orthopaedic Surgery

## 2019-12-28 ENCOUNTER — Ambulatory Visit: Payer: Self-pay

## 2019-12-31 ENCOUNTER — Ambulatory Visit (INDEPENDENT_AMBULATORY_CARE_PROVIDER_SITE_OTHER): Payer: BC Managed Care – PPO

## 2019-12-31 ENCOUNTER — Other Ambulatory Visit: Payer: Self-pay

## 2019-12-31 DIAGNOSIS — L500 Allergic urticaria: Secondary | ICD-10-CM

## 2019-12-31 MED ORDER — OMALIZUMAB 150 MG ~~LOC~~ SOLR
300.0000 mg | SUBCUTANEOUS | Status: DC
  Administered 2019-12-31 – 2020-02-21 (×2): 300 mg via SUBCUTANEOUS

## 2020-01-12 ENCOUNTER — Encounter: Payer: Self-pay | Admitting: Family Medicine

## 2020-01-14 MED ORDER — TOPIRAMATE 50 MG PO TABS: 50.0000 mg | ORAL_TABLET | Freq: Every day | ORAL | 1 refills | Status: DC

## 2020-01-29 ENCOUNTER — Other Ambulatory Visit: Payer: Self-pay | Admitting: Primary Care

## 2020-01-29 DIAGNOSIS — E538 Deficiency of other specified B group vitamins: Secondary | ICD-10-CM

## 2020-01-29 DIAGNOSIS — E559 Vitamin D deficiency, unspecified: Secondary | ICD-10-CM

## 2020-01-30 NOTE — Telephone Encounter (Signed)
Last prescribed on 11/07/2019 . Last appointment on  11/06/2019. Next future lab appointment on 02/05/2020

## 2020-01-31 NOTE — Telephone Encounter (Signed)
Lab appointment scheduled for 02/05/20. Will await results.

## 2020-02-05 ENCOUNTER — Other Ambulatory Visit (INDEPENDENT_AMBULATORY_CARE_PROVIDER_SITE_OTHER): Payer: BC Managed Care – PPO

## 2020-02-05 DIAGNOSIS — E559 Vitamin D deficiency, unspecified: Secondary | ICD-10-CM | POA: Diagnosis not present

## 2020-02-05 DIAGNOSIS — E538 Deficiency of other specified B group vitamins: Secondary | ICD-10-CM | POA: Diagnosis not present

## 2020-02-05 LAB — VITAMIN D 25 HYDROXY (VIT D DEFICIENCY, FRACTURES): VITD: 32.54 ng/mL (ref 30.00–100.00)

## 2020-02-05 LAB — VITAMIN B12: Vitamin B-12: 251 pg/mL (ref 211–911)

## 2020-02-11 ENCOUNTER — Ambulatory Visit: Payer: Self-pay

## 2020-02-12 ENCOUNTER — Other Ambulatory Visit: Payer: Self-pay | Admitting: Radiology

## 2020-02-12 DIAGNOSIS — N6320 Unspecified lump in the left breast, unspecified quadrant: Secondary | ICD-10-CM

## 2020-02-13 ENCOUNTER — Ambulatory Visit: Payer: Self-pay

## 2020-02-21 ENCOUNTER — Ambulatory Visit (INDEPENDENT_AMBULATORY_CARE_PROVIDER_SITE_OTHER): Payer: BC Managed Care – PPO

## 2020-02-21 ENCOUNTER — Other Ambulatory Visit: Payer: Self-pay

## 2020-02-21 DIAGNOSIS — L500 Allergic urticaria: Secondary | ICD-10-CM

## 2020-02-25 ENCOUNTER — Ambulatory Visit: Payer: Self-pay

## 2020-03-03 ENCOUNTER — Other Ambulatory Visit: Payer: BC Managed Care – PPO

## 2020-03-03 NOTE — Telephone Encounter (Signed)
Does she need a virtual visit?

## 2020-03-03 NOTE — Telephone Encounter (Signed)
Yes, needs virtual visit. Please schedule.

## 2020-03-04 NOTE — Telephone Encounter (Signed)
Lvm asking pt to call office 

## 2020-03-11 ENCOUNTER — Ambulatory Visit (INDEPENDENT_AMBULATORY_CARE_PROVIDER_SITE_OTHER): Payer: BC Managed Care – PPO | Admitting: Allergy & Immunology

## 2020-03-11 ENCOUNTER — Other Ambulatory Visit: Payer: Self-pay

## 2020-03-11 ENCOUNTER — Encounter: Payer: Self-pay | Admitting: Allergy & Immunology

## 2020-03-11 VITALS — BP 110/70 | HR 76 | Temp 97.0°F | Resp 16

## 2020-03-11 DIAGNOSIS — L500 Allergic urticaria: Secondary | ICD-10-CM | POA: Diagnosis not present

## 2020-03-11 MED ORDER — EPINEPHRINE 0.3 MG/0.3ML IJ SOAJ: 0.3000 mg | Freq: Once | INTRAMUSCULAR | 1 refills | Status: AC

## 2020-03-11 NOTE — Patient Instructions (Addendum)
1. Allergic urticaria - We are going to stop the Xolair to see how you do without it.  - Call us with any issues.  - We can always restart it if needed.   2. Return in about 6 months (around 09/11/2020). This can be an in-person, a virtual Webex or a telephone follow up visit.   Please inform us of any Emergency Department visits, hospitalizations, or changes in symptoms. Call us before going to the ED for breathing or allergy symptoms since we might be able to fit you in for a sick visit. Feel free to contact us anytime with any questions, problems, or concerns.  It was a pleasure to see you again today!  Websites that have reliable patient information: 1. American Academy of Asthma, Allergy, and Immunology: www.aaaai.org 2. Food Allergy Research and Education (FARE): foodallergy.org 3. Mothers of Asthmatics: http://www.asthmacommunitynetwork.org 4. American College of Allergy, Asthma, and Immunology: www.acaai.org  "Like" [place] on Facebook and Instagram for our latest updates!      Make sure you are registered to vote! If you have moved or changed any of your contact information, you will need to get this updated before voting!  In some cases, you MAY be able to register to vote online: [email or web address]

## 2020-03-11 NOTE — Progress Notes (Signed)
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 Allergic urticaria - We are going to stop the Xolair to see how you do without it.  - Call us with any issues.  - We can always restart it if needed.   2. Return in about 6 months (around 09/11/2020). This can be an in-person, a virtual Webex or a telephone follow up visit.  Subjective:   Faith Jordan is a 24 y.o. female presenting today for follow up of  Chief Complaint  Patient presents with  . Urticaria    No episodes    MAUDENA BONTEMPS has a history of the following: Patient Active Problem List   Diagnosis Date Noted  . Fatigue 11/06/2019  . Chronic back pain 07/06/2019  . Urinary frequency 02/12/2019  . Preventative health care 04/27/2018  . Migraines 04/25/2018  . 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 Anemia 01/04/2018  . Angio-edema 04/27/2016  . Headache disorder 12/29/2015  . Acquired breast deformity 12/22/2015  . 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  . Sinus tachycardia 12/22/2015  . Hives 12/22/2015  . Dysautonomia ([place]) 12/22/2015  . 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 Neurocardiogenic syncope 04/06/2013  . Intermittent palpitations 04/06/2013    History obtained from: chart review and patient.  Faith Jordan is a 24 y.o. female presenting for a follow up visit. She has a long standing history of CIU and her workup in the past has demonstrated a positive anti thyroglobulin antibodies. She has been very well controlled on Xolair and at the last visit, we decided to space it out to every 8 weeks.   Since the last visit, she has done well. She has spaced out to every 8 weeks without a problem. She did previously have a prodrome where she could tell if she was going to have a  breakthrough. She is not having back swelling at all. She is not using any antihistamines at all. She is open to stopping the Xolair completely.  She is going to get the COVID19 vaccination but she is nervous. Her mother has a complicated history and she herself has had problems with syncope.   Otherwise, there have been no changes to her past medical history, surgical history, family history, or social history.    Review of Systems  Constitutional: Negative.  Negative for chills, fever, malaise/fatigue and weight loss.  HENT: Negative.  Negative for congestion, ear discharge, ear pain, nosebleeds, sinus pain and sore throat.   Eyes: Negative for pain, discharge and redness.  Respiratory: Negative for cough, sputum production, shortness of breath and wheezing.   Cardiovascular: Negative.  Negative for chest pain and palpitations.  Gastrointestinal: Negative for abdominal pain, constipation, diarrhea, heartburn, nausea and vomiting.  Skin: Negative.  Negative for itching and rash.  Neurological: Negative for dizziness and headaches.  Endo/Heme/Allergies: Negative for environmental allergies. Does not bruise/bleed easily.       Objective:   Blood pressure 110/70, pulse 76, temperature (!) 97 F (36.1 C), temperature source Temporal, resp. rate 16, SpO2 100 %. There is no height or weight on file to calculate BMI.   Physical Exam:  Physical Exam  Constitutional: She appears well-developed.  HENT:  Head: Normocephalic and atraumatic.  Right Ear: Tympanic membrane, external ear and ear canal normal.  Left  Ear: Tympanic membrane and ear canal normal.  Nose: No mucosal edema, rhinorrhea, nasal deformity or septal deviation. No epistaxis. Right sinus exhibits no maxillary sinus tenderness and no frontal sinus tenderness. Left sinus exhibits no maxillary sinus tenderness and no frontal sinus tenderness.  Mouth/Throat: Uvula is midline and oropharynx is clear and moist. Mucous membranes  are not pale and not dry.  Eyes: Pupils are equal, round, and reactive to light. Conjunctivae and EOM are normal. Right eye exhibits no chemosis and no discharge. Left eye exhibits no chemosis and no discharge. Right conjunctiva is not injected. Left conjunctiva is not injected.  Cardiovascular: Normal rate, regular rhythm and normal heart sounds.  Respiratory: Effort normal and breath sounds normal. No accessory muscle usage. No tachypnea. No respiratory distress. She has no wheezes. She has no rhonchi. She has no rales. She exhibits no tenderness.  Neurological: She is alert.  Skin: No abrasion, no petechiae and no rash noted. Rash is not papular, not vesicular and not urticarial. No erythema. No pallor.  Psychiatric: She has a normal mood and affect.     Diagnostic studies: none     Salvatore Marvel, MD  [facility]

## 2020-03-18 ENCOUNTER — Encounter: Payer: Self-pay | Admitting: Primary Care

## 2020-03-18 ENCOUNTER — Other Ambulatory Visit: Payer: Self-pay

## 2020-03-18 ENCOUNTER — Ambulatory Visit (INDEPENDENT_AMBULATORY_CARE_PROVIDER_SITE_OTHER): Payer: BC Managed Care – PPO | Admitting: Primary Care

## 2020-03-18 VITALS — BP 116/72 | HR 60 | Temp 96.5°F | Ht 66.0 in | Wt 130.5 lb

## 2020-03-18 DIAGNOSIS — F411 Generalized anxiety disorder: Secondary | ICD-10-CM | POA: Diagnosis not present

## 2020-03-18 MED ORDER — SERTRALINE HCL 50 MG PO TABS: 50.0000 mg | ORAL_TABLET | Freq: Every day | ORAL | 1 refills | Status: DC

## 2020-03-18 NOTE — Patient Instructions (Signed)
Start sertraline (Zoloft) 50 mg once daily for anxiety. Start by taking 1/2 tablet for 8 days, then increase to one full tablet thereafter.  Please schedule a follow up visit for 6 weeks as discussed.   It was a pleasure to see you today!

## 2020-03-18 NOTE — Assessment & Plan Note (Signed)
Chronic for the last 5 years, worse over the last one year. GAD 7 score of 13 today, denies symptoms of depression.   Discussed options for treatment, she would like to proceed with medication. Rx for Zoloft 50 mg sent to pharmacy.  Patient is to take 1/2 tablet daily for 8 days, then advance to 1 full tablet thereafter. We discussed possible side effects of headache, GI upset, drowsiness, and SI/HI. If thoughts of SI/HI develop, we discussed to present to the emergency immediately. Patient verbalized understanding.   Follow up in 6 weeks for re-evaluation.

## 2020-03-18 NOTE — Progress Notes (Signed)
Subjective:    Patient ID: Faith Jordan, female    DOB: Jan 12, 1996, 23 y.o.   MRN: [id number]  HPI  This visit occurred during the SARS-CoV-2 public health emergency.  Safety protocols were in place, including screening questions prior to the visit, additional usage of staff PPE, and extensive cleaning of exam room while observing appropriate contact time as indicated for disinfecting solutions.   Faith Jordan is a 24 year old female with a history of migraines, ANS, palpitations, chronic back pain, sinus tachycardia, fatigue, anxiety who presents today to discuss anxiety.  Prior history of anxiety several years ago "during nursing school" was placed on Xanax for which she used PRN. She's been talking with a nurse practitioner colleague of hers who recommended a daily medication. She was taking imipramine for migraines years ago.   Symptoms include difficulty sleeping, over thinking things, feeling stressed, feeling anxious. Symptoms began about 5 year ago, worse over the last one year. GAD 7 score of 13 today. She denies feeling down/sad.   BP Readings from Last 3 Encounters:  03/18/20 116/72  03/11/20 110/70  11/06/19 116/72     Review of Systems  Respiratory: Negative for shortness of breath.   Cardiovascular: Negative for chest pain and palpitations.  Psychiatric/Behavioral: Positive for sleep disturbance. The patient is nervous/anxious.        See HPI       Past Medical History:  Diagnosis Date  . Acne   . Angioedema   . Aquagenic angio-edema-urticaria   . Asthma    no problems /not used recently  . Dysautonomia ([place])   . Dysrhythmia    sinus tachycardia  . Fibroid    right breast, adenoma  . Headache(784.0)   . Pneumonia    hx  6th grade  . Vasculitis ([place])      Social History   Socioeconomic History  . Marital status: Single    Spouse name: Not on file  . Number of children: Not on file  . Years of education: Not on file  . Highest education level:  Not on file  Occupational History  . Not on file  Tobacco Use  . Smoking status: Never Smoker  . Smokeless tobacco: Never Used  Substance and Sexual Activity  . Alcohol use: No  . Drug use: No  . Sexual activity: Not on file  Other Topics Concern  . Not on file  Social History Narrative   Student at [employer], [place].    Enjoys reading.   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    Social Determinants of Health   Financial Resource Strain:   . Difficulty of Paying Living Expenses:   Food Insecurity:   . Worried About Charity fundraiser in the Last Year:   . Arboriculturist in the Last Year:   Transportation Needs:   . Film/video editor (Medical):   Marland Kitchen Lack of Transportation (Non-Medical):   Physical Activity:   . Days of Exercise per Week:   . Minutes of Exercise per Session:   Stress:   . Feeling of Stress :   Social Connections:   . Frequency of Communication with Friends and Family:   . Frequency of Social Gatherings with Friends and Family:   . Attends Religious Services:   . Active Member of Clubs or Organizations:   . Attends Archivist Meetings:   Marland Kitchen Marital Status:   Intimate Partner Violence:   . Fear of Current  or Ex-Partner:   . Emotionally Abused:   Marland Kitchen Physically Abused:   . Sexually Abused:     Past Surgical History:  Procedure Laterality Date  . ADENOIDECTOMY    . BREAST LUMPECTOMY Right   . BREAST LUMPECTOMY Right   . MASS EXCISION Right 10/07/2014   Procedure: EXCISION OF RIGHT BREAST MASS;  Surgeon: Jackolyn Confer, MD;  Location: [facility];  Service: General;  Laterality: Right;  . TONSILLECTOMY AND ADENOIDECTOMY    . TOOTH EXTRACTION      Family History  Problem Relation Age of Onset  . Breast cancer Maternal Grandmother 39  . Myasthenia gravis Maternal Grandfather   . Migraines Mother   . Narcolepsy Mother   . Hypothyroidism Mother   . Asthma Other   . Cancer Other   . Epilepsy Other   . Allergic rhinitis Neg Hx     . Angioedema Neg Hx   . Atopy Neg Hx   . Eczema Neg Hx   . Immunodeficiency Neg Hx   . Urticaria Neg Hx     Allergies  Allergen Reactions  . 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 Nsaids Dermatitis    Reaction similar to Stephens-Johnson, blistering peeling rash  . Sulfa Antibiotics Swelling    Facial swelling    Current Outpatient Medications on File Prior to Visit  Medication Sig Dispense Refill  . cyanocobalamin (,VITAMIN B-12,) 1000 MCG/ML injection Inject 1 ml intramuscularly every other week for 2 months, then once during month 3. 5 mL 0  . EMGALITY 120 MG/ML SOAJ INJECT 120 MG INTO THE SKIN EVERY 30 (THIRTY) DAYS. 1 pen 10  . Levonorgestrel-Ethinyl Estradiol (AMETHIA) 0.15-0.03 &0.01 MG tablet Take 1 tablet by mouth daily.    . meloxicam (MOBIC) 15 MG tablet TAKE 1 TABLET BY MOUTH EVERY DAY 30 tablet 0  . propranolol (INDERAL) 20 MG tablet TAKE 1 TABLET BY MOUTH EVERY DAY MUST MAKE APPT FOR REFILLS 30 tablet 11  . topiramate (TOPAMAX) 50 MG tablet Take 1 tablet (50 mg total) by mouth daily. 90 tablet 1  . Ubrogepant (UBRELVY) 100 MG TABS Take 100 mg by mouth daily as needed. Take one tablet at onset of headache, may repeat 1 tablet in 2 hours, no more than 2 tablets in 24 hours 10 tablet 11  . XOLAIR 150 MG injection INJECT 300 MG SUBCUTANEOUSLY EVERY 4 WEEKS. 2 each 10  . ALPRAZolam (XANAX) 0.25 MG tablet Take 0.25 mg by mouth daily as needed for anxiety.    Marland Kitchen EPINEPHrine 0.3 mg/0.3 mL IJ SOAJ injection SMARTSIG:0.3 Milligram(s) IM Once PRN     No current facility-administered medications on file prior to visit.    BP 116/72   Pulse 60   Temp (!) 96.5 F (35.8 C) (Temporal)   Ht 5\' 6"  (1.676 m)   Wt 130 lb 8 oz (59.2 kg)   SpO2 98%   BMI 21.06 kg/m    Objective:   Physical Exam  Constitutional: She appears well-nourished.  Cardiovascular: Normal rate and regular rhythm.  Respiratory: Effort normal and  breath sounds normal.  Musculoskeletal:     Cervical back: Neck supple.  Skin: Skin is warm and dry.  Psychiatric: She has a normal mood and affect.           Assessment & Plan:

## 2020-03-26 DIAGNOSIS — F411 Generalized anxiety disorder: Secondary | ICD-10-CM

## 2020-04-01 MED ORDER — ESCITALOPRAM OXALATE 10 MG PO TABS: 10.0000 mg | ORAL_TABLET | Freq: Every day | ORAL | 1 refills | Status: DC

## 2020-04-17 ENCOUNTER — Ambulatory Visit: Payer: Self-pay

## 2020-04-28 ENCOUNTER — Encounter: Payer: Self-pay | Admitting: Family Medicine

## 2020-04-29 ENCOUNTER — Telehealth: Payer: Self-pay | Admitting: Family Medicine

## 2020-04-29 NOTE — Telephone Encounter (Signed)
PA started for Emgality 120mg  via [email or web address]. Key is [id number]. Will await the question set from [place].   PA started for Ubrelvy 100mg  via [email or web address]. Key is [id number]. PA has been approved from 04/29/2020-04/29/2021. Pharmacy is aware of approval.

## 2020-04-30 NOTE — Telephone Encounter (Signed)
Received a response from [facility]. Per [facility], a PA is not needed for [employer]. Information has been sent over to the pharmacy. Will call the pharmacy after lunch to see if they are able to process the prescription.

## 2020-05-12 ENCOUNTER — Encounter: Payer: Self-pay | Admitting: Primary Care

## 2020-05-12 ENCOUNTER — Ambulatory Visit (INDEPENDENT_AMBULATORY_CARE_PROVIDER_SITE_OTHER): Payer: BC Managed Care – PPO | Admitting: Primary Care

## 2020-05-12 ENCOUNTER — Other Ambulatory Visit: Payer: Self-pay

## 2020-05-12 DIAGNOSIS — F411 Generalized anxiety disorder: Secondary | ICD-10-CM

## 2020-05-12 MED ORDER — ESCITALOPRAM OXALATE 10 MG PO TABS: 10.0000 mg | ORAL_TABLET | Freq: Every day | ORAL | 0 refills | Status: DC

## 2020-05-12 NOTE — Progress Notes (Signed)
Subjective:    Patient ID: Faith Jordan, female    DOB: June 22, 1996, 24 y.o.   MRN: [id number]  HPI  This visit occurred during the SARS-CoV-2 public health emergency.  Safety protocols were in place, including screening questions prior to the visit, additional usage of staff PPE, and extensive cleaning of exam room while observing appropriate contact time as indicated for disinfecting solutions.   [name] is a 24 year old female with a history of migraines, neurocardiogenic syncope, palpitations, chronic back pain, sinus tachycardia, GAD who presents today for follow up of anxiety.  She was last evaluated on 03/18/20 for symptoms of anxiety. Anxiety had been chronic for the last five years but worse over the last one year. Symptoms included difficulty sleeping, over thinking things, feeling stressed. GAD 7 score of 13 so we initiated Zoloft 50 mg and asked her to follow up today.   She sent a follow up message a few days later reporting that she didn't feel well on Zoloft, actually increased anxiety, so we switched her to Lexapro.  Since her switch to Lexapro she's feeling better. Positive effects from Lexapro include improved mood, feels happier at work, feeling less stressed, improved sleeping. She denies SI/HI, GI upset. She has been on vacation for the last week, is returning this week and is feeling anxious about returning.    Review of Systems  Respiratory: Negative for shortness of breath.   Cardiovascular: Negative for chest pain.  Gastrointestinal: Negative for nausea.  Psychiatric/Behavioral:       See HPI       Past Medical History:  Diagnosis Date  . Acne   . Angioedema   . Aquagenic angio-edema-urticaria   . Asthma    no problems /not used recently  . Dysautonomia ([place])   . Dysrhythmia    sinus tachycardia  . Fibroid    right breast, adenoma  . Headache(784.0)   . Pneumonia    hx  6th grade  . Vasculitis ([place])      Social History   Socioeconomic  History  . Marital status: Single    Spouse name: Not on file  . Number of children: Not on file  . Years of education: Not on file  . Highest education level: Not on file  Occupational History  . Not on file  Tobacco Use  . Smoking status: Never Smoker  . Smokeless tobacco: Never Used  Vaping Use  . Vaping Use: Never used  Substance and Sexual Activity  . Alcohol use: No  . Drug use: No  . Sexual activity: Not on file  Other Topics Concern  . Not on file  Social History Narrative   Student at [employer], [place].    Enjoys reading.   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    Social Determinants of Health   Financial Resource Strain:   . Difficulty of Paying Living Expenses:   Food Insecurity:   . Worried About Charity fundraiser in the Last Year:   . Arboriculturist in the Last Year:   Transportation Needs:   . Film/video editor (Medical):   Marland Kitchen Lack of Transportation (Non-Medical):   Physical Activity:   . Days of Exercise per Week:   . Minutes of Exercise per Session:   Stress:   . Feeling of Stress :   Social Connections:   . Frequency of Communication with Friends and Family:   . Frequency of Social Gatherings with Friends  and Family:   . Attends Religious Services:   . Active Member of Clubs or Organizations:   . Attends Archivist Meetings:   Marland Kitchen Marital Status:   Intimate Partner Violence:   . Fear of Current or Ex-Partner:   . Emotionally Abused:   Marland Kitchen Physically Abused:   . Sexually Abused:     Past Surgical History:  Procedure Laterality Date  . ADENOIDECTOMY    . BREAST LUMPECTOMY Right   . BREAST LUMPECTOMY Right   . MASS EXCISION Right 10/07/2014   Procedure: EXCISION OF RIGHT BREAST MASS;  Surgeon: Jackolyn Confer, MD;  Location: [facility];  Service: General;  Laterality: Right;  . TONSILLECTOMY AND ADENOIDECTOMY    . TOOTH EXTRACTION      Family History  Problem Relation Age of Onset  . Breast cancer Maternal Grandmother  10  . Myasthenia gravis Maternal Grandfather   . Migraines Mother   . Narcolepsy Mother   . Hypothyroidism Mother   . Asthma Other   . Cancer Other   . Epilepsy Other   . Allergic rhinitis Neg Hx   . Angioedema Neg Hx   . Atopy Neg Hx   . Eczema Neg Hx   . Immunodeficiency Neg Hx   . Urticaria Neg Hx     Allergies  Allergen Reactions  . 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 Nsaids Dermatitis    Reaction similar to Stephens-Johnson, blistering peeling rash  . Sulfa Antibiotics Swelling    Facial swelling    Current Outpatient Medications on File Prior to Visit  Medication Sig Dispense Refill  . ALPRAZolam (XANAX) 0.25 MG tablet Take 0.25 mg by mouth daily as needed for anxiety.    . cyanocobalamin (,VITAMIN B-12,) 1000 MCG/ML injection Inject 1 ml intramuscularly every other week for 2 months, then once during month 3. 5 mL 0  . EMGALITY 120 MG/ML SOAJ INJECT 120 MG INTO THE SKIN EVERY 30 (THIRTY) DAYS. 1 pen 10  . EPINEPHrine 0.3 mg/0.3 mL IJ SOAJ injection SMARTSIG:0.3 Milligram(s) IM Once PRN    . Levonorgestrel-Ethinyl Estradiol (AMETHIA) 0.15-0.03 &0.01 MG tablet Take 1 tablet by mouth daily.    . meloxicam (MOBIC) 15 MG tablet TAKE 1 TABLET BY MOUTH EVERY DAY 30 tablet 0  . propranolol (INDERAL) 20 MG tablet TAKE 1 TABLET BY MOUTH EVERY DAY MUST MAKE APPT FOR REFILLS 30 tablet 11  . topiramate (TOPAMAX) 50 MG tablet Take 1 tablet (50 mg total) by mouth daily. 90 tablet 1  . Ubrogepant (UBRELVY) 100 MG TABS Take 100 mg by mouth daily as needed. Take one tablet at onset of headache, may repeat 1 tablet in 2 hours, no more than 2 tablets in 24 hours 10 tablet 11   No current facility-administered medications on file prior to visit.    BP 110/70   Pulse 82   Temp (!) 96.1 F (35.6 C) (Temporal)   Ht 5\' 6"  (1.676 m)   Wt 135 lb 8 oz (61.5 kg)   SpO2 98%   BMI 21.87 kg/m    Objective:   Physical  Exam Cardiovascular:     Rate and Rhythm: Normal rate and regular rhythm.  Pulmonary:     Effort: Pulmonary effort is normal.     Breath sounds: Normal breath sounds.  Musculoskeletal:     Cervical back: Neck supple.  Skin:    General: Skin is warm and dry.  Assessment & Plan:

## 2020-05-12 NOTE — Assessment & Plan Note (Signed)
Could not tolerate Zoloft, is feeling better with Lexapro. She does appear more calm, but I do sense she may have some anxiety when returning to work.  For now we will continue Lexapro 10 mg as she is improving. Consider increasing dose to 20 mg if needed. She will call to update.

## 2020-05-12 NOTE — Patient Instructions (Signed)
Continue taking escitalopram (Lexapro) 10 mg daily for anxiety. Please update me if anything changes.  It was a pleasure to see you today!

## 2020-06-03 ENCOUNTER — Ambulatory Visit (INDEPENDENT_AMBULATORY_CARE_PROVIDER_SITE_OTHER): Payer: BC Managed Care – PPO | Admitting: Family Medicine

## 2020-06-03 ENCOUNTER — Encounter: Payer: Self-pay | Admitting: Family Medicine

## 2020-06-03 VITALS — BP 107/71 | HR 75 | Ht 66.0 in | Wt 135.8 lb

## 2020-06-03 DIAGNOSIS — G43709 Chronic migraine without aura, not intractable, without status migrainosus: Secondary | ICD-10-CM | POA: Diagnosis not present

## 2020-06-03 NOTE — Progress Notes (Addendum)
PATIENT: LOTA LEAMER DOB: [birth year]  REASON FOR VISIT: follow up HISTORY FROM: patient  Chief Complaint  Patient presents with  . Follow-up    63yr f/u for migraines. States she has been doing well since last visit.   Marland Kitchen room 2    alone      HISTORY OF PRESENT ILLNESS: Today 06/03/20 PERLINE AWE is a 24 y.o. female here today for follow up for migraines. She continues topiramate, Emgality and propranolol for prevention. Roselyn Meier helps with migraine abortion. She reports that she is doing very well from a migraine perspective. She can not remember the last time she needed to take [place]. She may have 1-2 mild headaches just prior to due date for Emgality. 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 She is staying very busy.   HISTORY: (copied from my note on 05/31/2019)  RAMANDA PAULES is a 24 y.o. female here today for follow up of migraines. She was restarted on topiramate 50 mg at night about 4 months ago. She continued Emgality injections and propranalol (originally prescribed for tachycardia).   She is taking Amethia for birth control. She is taking OCP continuously for three months with quarterly menstrual cycles. She reports that migraines are very well managed until the week of menstrual cycle. She states that migraine is usually retro orbital (usually right but can be on the left) with pounding and light/sound sensitivity. She has tried Iran, Aleve, and Tylenol intermittently for acute management but does not feel anything helps. She can not tolerate triptans due to tachycardia. She has a reported allergy of dermatitis with ibuprofen. She does not have any reaction with Aleve.   She is currently on her menstrual cycle. She has a terrible migraine that is mostly located behind her right eye. She does have mild pain of left forehead as well. She is requesting a nerve block today as this has worked well in the past.   HISTORY: (copied from my note on  01/16/2019)  Ellise Kovack Chapmonis a 24 y.o.femaleseentoday for follow up. She reports that her migraines have worsened specifically over the last month. She feels that stress is definitely a trigger. She is in school and finishing up with exams at this time. She knows that weather also contributes. She notices significant worsening around the time of her menstrual cycle. Pain is typically right-sided retro-orbital pressure. Occasionally there is throbbing, light sensitivity and nausea. She continues Emgality every month. She is using Ubrelvyfor abortive therapy that does help temporarily. She is also on propranolol for tachycardia. She reports a history of syncope as well that is followed closely by her cardiologist.  She has tried topiramate in the past but is unsure why this medication was stopped. She is also tried imipemide but reports that it did not help. She has taken Maxalt and Imitrex for abortive therapy but reports tachycardia with a triptan therapy. She is allergic to NSAIDs.   REVIEW OF SYSTEMS: Out of a complete 14 system review of symptoms, the patient complains only of the following symptoms, none and all other reviewed systems are negative.  ALLERGIES: Allergies  Allergen Reactions  . 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 Nsaids Dermatitis    Reaction similar to Stephens-Johnson, blistering peeling rash  . Sulfa Antibiotics Swelling    Facial swelling    HOME MEDICATIONS: Outpatient Medications Prior to Visit  Medication Sig Dispense Refill  . ALPRAZolam (  XANAX) 0.25 MG tablet Take 0.25 mg by mouth daily as needed for anxiety.    . cyanocobalamin (,VITAMIN B-12,) 1000 MCG/ML injection Inject 1 ml intramuscularly every other week for 2 months, then once during month 3. 5 mL 0  . EMGALITY 120 MG/ML SOAJ INJECT 120 MG INTO THE SKIN EVERY 30 (THIRTY) DAYS. 1 pen 10  . EPINEPHrine 0.3 mg/0.3 mL IJ SOAJ  injection SMARTSIG:0.3 Milligram(s) IM Once PRN    . escitalopram (LEXAPRO) 10 MG tablet Take 1 tablet (10 mg total) by mouth daily. For anxiety. 90 tablet 0  . Levonorgestrel-Ethinyl Estradiol (AMETHIA) 0.15-0.03 &0.01 MG tablet Take 1 tablet by mouth daily.    . meloxicam (MOBIC) 15 MG tablet TAKE 1 TABLET BY MOUTH EVERY DAY 30 tablet 0  . propranolol (INDERAL) 20 MG tablet TAKE 1 TABLET BY MOUTH EVERY DAY MUST MAKE APPT FOR REFILLS 30 tablet 11  . topiramate (TOPAMAX) 50 MG tablet Take 1 tablet (50 mg total) by mouth daily. 90 tablet 1  . Ubrogepant (UBRELVY) 100 MG TABS Take 100 mg by mouth daily as needed. Take one tablet at onset of headache, may repeat 1 tablet in 2 hours, no more than 2 tablets in 24 hours 10 tablet 11   No facility-administered medications prior to visit.    PAST MEDICAL HISTORY: Past Medical History:  Diagnosis Date  . Acne   . Angioedema   . Aquagenic angio-edema-urticaria   . Asthma    no problems /not used recently  . Dysautonomia ([place])   . Dysrhythmia    sinus tachycardia  . Fibroid    right breast, adenoma  . Headache(784.0)   . Pneumonia    hx  6th grade  . Vasculitis ([place])     PAST SURGICAL HISTORY: Past Surgical History:  Procedure Laterality Date  . ADENOIDECTOMY    . BREAST LUMPECTOMY Right   . BREAST LUMPECTOMY Right   . MASS EXCISION Right 10/07/2014   Procedure: EXCISION OF RIGHT BREAST MASS;  Surgeon: Jackolyn Confer, MD;  Location: [facility];  Service: General;  Laterality: Right;  . TONSILLECTOMY AND ADENOIDECTOMY    . TOOTH EXTRACTION      FAMILY HISTORY: Family History  Problem Relation Age of Onset  . Breast cancer Maternal Grandmother 95  . Myasthenia gravis Maternal Grandfather   . Migraines Mother   . Narcolepsy Mother   . Hypothyroidism Mother   . Asthma Other   . Cancer Other   . Epilepsy Other   . Allergic rhinitis Neg Hx   . Angioedema Neg Hx   . Atopy Neg Hx   . Eczema Neg Hx   . Immunodeficiency Neg Hx   .  Urticaria Neg Hx     SOCIAL HISTORY: Social History   Socioeconomic History  . Marital status: Single    Spouse name: Not on file  . Number of children: Not on file  . Years of education: Not on file  . Highest education level: Not on file  Occupational History  . Not on file  Tobacco Use  . Smoking status: Never Smoker  . Smokeless tobacco: Never Used  Vaping Use  . Vaping Use: Never used  Substance and Sexual Activity  . Alcohol use: No  . Drug use: No  . Sexual activity: Not on file  Other Topics Concern  . Not on file  Social History Narrative   Student at [employer], [place].    Enjoys reading.   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    Social Determinants of Health   Financial Resource Strain:   . Difficulty of Paying Living Expenses:   Food Insecurity:   . Worried About Charity fundraiser in the Last Year:   . Arboriculturist in the Last Year:   Transportation Needs:   . Film/video editor (Medical):   Marland Kitchen Lack of Transportation (Non-Medical):   Physical Activity:   . Days of Exercise per Week:   . Minutes of Exercise per Session:   Stress:   . Feeling of Stress :   Social Connections:   . Frequency of Communication with Friends and Family:   . Frequency of Social Gatherings with Friends and Family:   . Attends Religious Services:   . Active Member of Clubs or Organizations:   . Attends Archivist Meetings:   Marland Kitchen Marital Status:   Intimate Partner Violence:   . Fear of Current or Ex-Partner:   . Emotionally Abused:   Marland Kitchen Physically Abused:   . Sexually Abused:       PHYSICAL EXAM  Vitals:   06/03/20 1522  BP: 107/71  Pulse: 75  Weight: 135 lb 12.8 oz (61.6 kg)  Height: 5\' 6"  (1.676 m)   Body mass index is 21.92 kg/m.  Generalized: Well developed, in no acute distress  Cardiology: normal rate and rhythm, no murmur noted Respiratory: clear to auscultation bilaterally  Neurological examination  Mentation: Alert oriented  to time, place, history taking. Follows all commands speech and language fluent Cranial nerve II-XII: Pupils were equal round reactive to light. Extraocular movements were full, visual field were full  Motor: The motor testing reveals 5 over 5 strength of all 4 extremities. Good symmetric motor tone is noted throughout.  Gait and station: Gait is normal.   DIAGNOSTIC DATA (LABS, IMAGING, TESTING) - I reviewed patient records, labs, notes, testing and imaging myself where available.  No flowsheet data found.   Lab Results  Component Value Date   WBC 8.9 11/06/2019   HGB 13.6 11/06/2019   HCT 41.1 11/06/2019   MCV 88.5 11/06/2019   PLT 284.0 11/06/2019      Component Value Date/Time   NA 137 11/06/2019 0855   NA 142 09/28/2017 1459   K 3.6 11/06/2019 0855   CL 106 11/06/2019 0855   CO2 21 11/06/2019 0855   GLUCOSE 109 (H) 11/06/2019 0855   BUN 10 11/06/2019 0855   BUN 3 (L) 09/28/2017 1459   CREATININE 0.82 11/06/2019 0855   CALCIUM 9.0 11/06/2019 0855   PROT 6.7 11/06/2019 0855   PROT 6.5 09/28/2017 1459   ALBUMIN 4.2 11/06/2019 0855   ALBUMIN 4.0 09/28/2017 1459   AST 11 11/06/2019 0855   ALT 9 11/06/2019 0855   ALKPHOS 43 11/06/2019 0855   BILITOT 0.5 11/06/2019 0855   BILITOT 0.2 09/28/2017 1459   GFRNONAA >60 12/01/2017 1018   GFRAA >60 12/01/2017 1018   Lab Results  Component Value Date   CHOL 200 04/27/2018   HDL 57.80 04/27/2018   LDLCALC 111 (H) 04/27/2018   TRIG 153.0 (H) 04/27/2018   CHOLHDL 3 04/27/2018   No results found for: HGBA1C Lab Results  Component Value Date   VITAMINB12 251 02/05/2020   Lab Results  Component Value Date   TSH 1.58 11/06/2019       ASSESSMENT AND PLAN 24 y.o. year old female  has a past medical history of Acne, Angioedema, Aquagenic angio-edema-urticaria, Asthma, Dysautonomia ([place]), Dysrhythmia, Fibroid,  Headache(784.0), Pneumonia, and Vasculitis ([place]). here with     ICD-10-CM   1. Chronic migraine without aura  without status migrainosus, not intractable  G43.709     brianna is doing very well. Headaches are well managed on current treatment plan. We will continue topiramate 50mg  daily and Emgality monthly for prevention as well as Ubrelvy for for prevention. She will continue propranolol 20mg  daily for tachycardia, managed by cardiology. She will continue healthy lifestyle habits. She will follow up with me in 1 year, sooner if needed.    No orders of the defined types were placed in this encounter.    No orders of the defined types were placed in this encounter.     I spent 15 minutes with the patient. 50% of this time was spent counseling and educating patient on plan of care and medications.    Debbora Presto, FNP-C 06/03/2020, 3:31 PM [facility] [address], [place], [place] [address] [phone]  Made any corrections needed, and agree with history, physical, neuro exam,assessment and plan as stated.     Sarina Ill, MD [facility]

## 2020-06-03 NOTE — Patient Instructions (Signed)
We will continue current treatment plan.   Stay well hydrated. Stay active. Try to eat a well balanced diet.   Follow up in 1 year, sooner if needed.   Migraine Headache A migraine headache is a very strong throbbing pain on one side or both sides of your head. This type of headache can also cause other symptoms. It can last from 4 hours to 3 days. Talk with your doctor about what things may bring on (trigger) this condition. What are the causes? The exact cause of this condition is not known. This condition may be triggered or caused by:  Drinking alcohol.  Smoking.  Taking medicines, such as: ? Medicine used to treat chest pain (nitroglycerin). ? Birth control pills. ? Estrogen. ? Some blood pressure medicines.  Eating or drinking certain products.  Doing physical activity. Other things that may trigger a migraine headache include:  Having a menstrual period.  Pregnancy.  Hunger.  Stress.  Not getting enough sleep or getting too much sleep.  Weather changes.  Tiredness (fatigue). What increases the risk?  Being 23-26 years old.  Being female.  Having a family history of migraine headaches.  Being Caucasian.  Having depression or anxiety.  Being very overweight. What are the signs or symptoms?  A throbbing pain. This pain may: ? Happen in any area of the head, such as on one side or both sides. ? Make it hard to do daily activities. ? Get worse with physical activity. ? Get worse around bright lights or loud noises.  Other symptoms may include: ? Feeling sick to your stomach (nauseous). ? Vomiting. ? Dizziness. ? Being sensitive to bright lights, loud noises, or smells.  Before you get a migraine headache, you may get warning signs (an aura). An aura may include: ? Seeing flashing lights or having blind spots. ? Seeing bright spots, halos, or zigzag lines. ? Having tunnel vision or blurred vision. ? Having numbness or a tingling  feeling. ? Having trouble talking. ? Having weak muscles.  Some people have symptoms after a migraine headache (postdromal phase), such as: ? Tiredness. ? Trouble thinking (concentrating). How is this treated?  Taking medicines that: ? Relieve pain. ? Relieve the feeling of being sick to your stomach. ? Prevent migraine headaches.  Treatment may also include: ? Having acupuncture. ? Avoiding foods that bring on migraine headaches. ? Learning ways to control your body functions (biofeedback). ? Therapy to help you know and deal with negative thoughts (cognitive behavioral therapy). Follow these instructions at home: Medicines  Take over-the-counter and prescription medicines only as told by your doctor.  Ask your doctor if the medicine prescribed to you: ? Requires you to avoid driving or using heavy machinery. ? Can cause trouble pooping (constipation). You may need to take these steps to prevent or treat trouble pooping:  Drink enough fluid to keep your pee (urine) pale yellow.  Take over-the-counter or prescription medicines.  Eat foods that are high in fiber. These include beans, whole grains, and fresh fruits and vegetables.  Limit foods that are high in fat and sugar. These include fried or sweet foods. Lifestyle  Do not drink alcohol.  Do not use any products that contain nicotine or tobacco, such as cigarettes, e-cigarettes, and chewing tobacco. If you need help quitting, ask your doctor.  Get at least 8 hours of sleep every night.  Limit and deal with stress. General instructions      Keep a journal to find out what may  bring on your migraine headaches. For example, write down: ? What you eat and drink. ? How much sleep you get. ? Any change in what you eat or drink. ? Any change in your medicines.  If you have a migraine headache: ? Avoid things that make your symptoms worse, such as bright lights. ? It may help to lie down in a dark, quiet  room. ? Do not drive or use heavy machinery. ? Ask your doctor what activities are safe for you.  Keep all follow-up visits as told by your doctor. This is important. Contact a doctor if:  You get a migraine headache that is different or worse than others you have had.  You have more than 15 headache days in one month. Get help right away if:  Your migraine headache gets very bad.  Your migraine headache lasts longer than 72 hours.  You have a fever.  You have a stiff neck.  You have trouble seeing.  Your muscles feel weak or like you cannot control them.  You start to lose your balance a lot.  You start to have trouble walking.  You pass out (faint).  You have a seizure. Summary  A migraine headache is a very strong throbbing pain on one side or both sides of your head. These headaches can also cause other symptoms.  This condition may be treated with medicines and changes to your lifestyle.  Keep a journal to find out what may bring on your migraine headaches.  Contact a doctor if you get a migraine headache that is different or worse than others you have had.  Contact your doctor if you have more than 15 headache days in a month. This information is not intended to replace advice given to you by your health care provider. Make sure you discuss any questions you have with your health care provider. Document Revised: 01/26/2019 Document Reviewed: 11/16/2018 Elsevier Patient Education  [place].

## 2020-07-11 ENCOUNTER — Encounter: Payer: Self-pay | Admitting: Family Medicine

## 2020-07-11 ENCOUNTER — Telehealth: Payer: Self-pay | Admitting: Family Medicine

## 2020-07-11 NOTE — Telephone Encounter (Signed)
Pt called stating she is needing a refill on her topiramate (TOPAMAX) 50 MG tablet sent in to [facility]

## 2020-07-14 ENCOUNTER — Other Ambulatory Visit: Payer: Self-pay | Admitting: *Deleted

## 2020-07-14 MED ORDER — TOPIRAMATE 50 MG PO TABS: 50.0000 mg | ORAL_TABLET | Freq: Every day | ORAL | 1 refills | Status: DC

## 2020-07-14 NOTE — Telephone Encounter (Signed)
Complete

## 2020-08-01 ENCOUNTER — Encounter: Payer: Self-pay | Admitting: Cardiovascular Disease

## 2020-08-01 ENCOUNTER — Other Ambulatory Visit: Payer: Self-pay

## 2020-08-01 ENCOUNTER — Ambulatory Visit (INDEPENDENT_AMBULATORY_CARE_PROVIDER_SITE_OTHER): Payer: BC Managed Care – PPO | Admitting: Cardiovascular Disease

## 2020-08-01 VITALS — BP 108/70 | HR 60 | Ht 66.0 in | Wt 136.0 lb

## 2020-08-01 DIAGNOSIS — R000 Tachycardia, unspecified: Secondary | ICD-10-CM | POA: Diagnosis not present

## 2020-08-01 DIAGNOSIS — G901 Familial dysautonomia [Riley-Day]: Secondary | ICD-10-CM | POA: Diagnosis not present

## 2020-08-01 MED ORDER — PROPRANOLOL HCL 20 MG PO TABS: ORAL_TABLET | ORAL | 11 refills | Status: DC

## 2020-08-01 NOTE — Assessment & Plan Note (Signed)
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  She has seen Dr. Caryl Comes, EP for evaluation in the past who made some recommendations with regards to hydration and exercise.  She is on low-dose propranolol.  Over the last year she is done extremely well.  She has had no tachycardia other than several weeks ago when she had some respiratory symptoms.

## 2020-08-01 NOTE — Progress Notes (Signed)
08/01/2020 CHANICE BRENTON   1996/09/24  [id number]  Primary Physician Pleas Koch, NP Primary Cardiologist: Lorretta Harp MD [name], [place]  HPI:  Faith Jordan is a 24 y.o.  fit appearing single Caucasian female who is transitioning her care from her pediatric cardiologist, Dr. Darcus Austin at [facility], to myself.  I last saw her in the office  09/12/2019. 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 She was diagnosed with dysautonomia to6years ago because of inappropriate sinus tachycardia. 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 When I saw her 6 months ago I increased her propranolol from 20-40 mg a day extended release. Since that time she's noticed some nightmares which has affected her sleeping pattern. Her heart rate continues to be in the high 90s.  I did refer her to Dr. Caryl Comes for EP evaluation.  He saw her on 02/14/2018 and agreed that she had dysautonomia and made some recommendations regarding rehydration and ongoing exercise.  Since I saw her a year ago her dizzy episodes, and inappropriate tachycardia have improved.  She remains on propanolol.   She continues to work in the [place] at [employer] and has been asymptomatic essentially for the last year.  She continues to take propranolol low-dose.  Current Meds  Medication Sig  . ALPRAZolam (XANAX) 0.25 MG tablet Take 0.25 mg by mouth daily as needed for anxiety.  . cyanocobalamin (,VITAMIN B-12,) 1000 MCG/ML injection Inject 1 ml intramuscularly every other week for 2 months, then once during month 3.  . EMGALITY 120 MG/ML SOAJ INJECT 120 MG INTO THE SKIN EVERY 30 (THIRTY) DAYS.  Marland Kitchen EPINEPHrine 0.3 mg/0.3 mL IJ SOAJ injection SMARTSIG:0.3 Milligram(s) IM Once PRN  . escitalopram (LEXAPRO) 10 MG tablet Take 1 tablet (10 mg total) by mouth daily. For anxiety.  .  Levonorgestrel-Ethinyl Estradiol (AMETHIA) 0.15-0.03 &0.01 MG tablet Take 1 tablet by mouth daily.  . propranolol (INDERAL) 20 MG tablet TAKE 1 TABLET BY MOUTH EVERY DAY  . topiramate (TOPAMAX) 50 MG tablet Take 1 tablet (50 mg total) by mouth daily.  Marland Kitchen Ubrogepant (UBRELVY) 100 MG TABS Take 100 mg by mouth daily as needed. Take one tablet at onset of headache, may repeat 1 tablet in 2 hours, no more than 2 tablets in 24 hours  . [DISCONTINUED] meloxicam (MOBIC) 15 MG tablet TAKE 1 TABLET BY MOUTH EVERY DAY  . [DISCONTINUED] propranolol (INDERAL) 20 MG tablet TAKE 1 TABLET BY MOUTH EVERY DAY MUST MAKE APPT FOR REFILLS     Allergies  Allergen Reactions  . 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 Nsaids Dermatitis    Reaction similar to Stephens-Johnson, blistering peeling rash  . Sulfa Antibiotics Swelling    Facial swelling    Social History   Socioeconomic History  . Marital status: Single    Spouse name: Not on file  . Number of children: Not on file  . Years of education: Not on file  . Highest education level: Not on file  Occupational History  . Not on file  Tobacco Use  . Smoking status: Never Smoker  . Smokeless tobacco: Never Used  Vaping Use  . Vaping Use: Never used  Substance and Sexual Activity  . Alcohol use: No  . Drug use: No  . Sexual  activity: Not on file  Other Topics Concern  . Not on file  Social History Narrative   Student at [employer], [place].    Enjoys reading.   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    Social Determinants of Health   Financial Resource Strain:   . Difficulty of Paying Living Expenses: Not on file  Food Insecurity:   . Worried About Charity fundraiser in the Last Year: Not on file  . Ran Out of Food in the Last Year: Not on file  Transportation Needs:   . Lack of Transportation (Medical): Not on file  . Lack of Transportation (Non-Medical): Not on file    Physical Activity:   . Days of Exercise per Week: Not on file  . Minutes of Exercise per Session: Not on file  Stress:   . Feeling of Stress : Not on file  Social Connections:   . Frequency of Communication with Friends and Family: Not on file  . Frequency of Social Gatherings with Friends and Family: Not on file  . Attends Religious Services: Not on file  . Active Member of Clubs or Organizations: Not on file  . Attends Archivist Meetings: Not on file  . Marital Status: Not on file  Intimate Partner Violence:   . Fear of Current or Ex-Partner: Not on file  . Emotionally Abused: Not on file  . Physically Abused: Not on file  . Sexually Abused: Not on file     Review of Systems: General: negative for chills, fever, night sweats or weight changes.  Cardiovascular: negative for chest pain, dyspnea on exertion, edema, orthopnea, palpitations, paroxysmal nocturnal dyspnea or shortness of breath Dermatological: negative for rash Respiratory: negative for cough or wheezing Urologic: negative for hematuria Abdominal: negative for nausea, vomiting, diarrhea, bright red blood per rectum, melena, or hematemesis Neurologic: negative for visual changes, syncope, or dizziness All other systems reviewed and are otherwise negative except as noted above.    Blood pressure 108/70, pulse 60, height 5\' 6"  (1.676 m), weight 136 lb (61.7 kg).  General appearance: alert and no distress Neck: no adenopathy, no carotid bruit, no JVD, supple, symmetrical, trachea midline and thyroid not enlarged, symmetric, no tenderness/mass/nodules Lungs: clear to auscultation bilaterally Heart: regular rate and rhythm, S1, S2 normal, no murmur, click, rub or gallop Extremities: extremities normal, atraumatic, no cyanosis or edema Pulses: 2+ and symmetric Skin: Skin color, texture, turgor normal. No rashes or lesions Neurologic: Alert and oriented X 3, normal strength and tone. Normal symmetric reflexes.  Normal coordination and gait  EKG sinus rhythm at 60 with RSR prime in lead V1 and V2 suggesting RV conduction delay.  I personally reviewed this EKG.  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  She has seen Dr. Caryl Comes, EP for evaluation in the past who made some recommendations with regards to hydration and exercise.  She is on low-dose propranolol.  Over the last year she is done extremely well.  She has had no tachycardia other than several weeks ago when she had some respiratory symptoms.      Lorretta Harp MD FACP,FACC,FAHA, [facility] 08/01/2020 11:45 AM

## 2020-08-01 NOTE — Patient Instructions (Signed)
Medication Instructions:  No changes *If you need a refill on your cardiac medications before your next appointment, please call your pharmacy*   Lab Work: None ordered If you have labs (blood work) drawn today and your tests are completely normal, you will receive your results only by: . MyChart Message (if you have MyChart) OR . A paper copy in the mail If you have any lab test that is abnormal or we need to change your treatment, we will call you to review the results.   Testing/Procedures: None ordered   Follow-Up: At [facility], you and your health needs are our priority.  As part of our continuing mission to provide you with exceptional heart care, we have created designated Provider Care Teams.  These Care Teams include your primary Cardiologist (physician) and Advanced Practice Providers (APPs -  Physician Assistants and Nurse Practitioners) who all work together to provide you with the care you need, when you need it.  We recommend signing up for the patient portal called "MyChart".  Sign up information is provided on this After Visit Summary.  MyChart is used to connect with patients for Virtual Visits (Telemedicine).  Patients are able to view lab/test results, encounter notes, upcoming appointments, etc.  Non-urgent messages can be sent to your provider as well.   To learn more about what you can do with MyChart, go to [email or web address].    Your next appointment:   12 month(s)  The format for your next appointment:   In Person  Provider:   You may see Dr. Berry or one of the following Advanced Practice Providers on your designated Care Team:    Luke Kilroy, PA-C  Callie Goodrich, PA-C  Jesse Cleaver, FNP   

## 2020-08-11 ENCOUNTER — Encounter: Payer: Self-pay | Admitting: Family Medicine

## 2020-08-11 ENCOUNTER — Other Ambulatory Visit: Payer: Self-pay | Admitting: *Deleted

## 2020-08-11 MED ORDER — EMGALITY 120 MG/ML ~~LOC~~ SOAJ: SUBCUTANEOUS | 11 refills | Status: DC

## 2020-08-23 ENCOUNTER — Other Ambulatory Visit: Payer: Self-pay | Admitting: Primary Care

## 2020-08-23 DIAGNOSIS — F411 Generalized anxiety disorder: Secondary | ICD-10-CM

## 2020-09-06 ENCOUNTER — Other Ambulatory Visit: Payer: Self-pay | Admitting: Cardiovascular Disease

## 2020-09-16 ENCOUNTER — Ambulatory Visit: Payer: BC Managed Care – PPO | Admitting: Allergy & Immunology

## 2020-11-20 DIAGNOSIS — F411 Generalized anxiety disorder: Secondary | ICD-10-CM

## 2020-11-21 ENCOUNTER — Emergency Department: Payer: BC Managed Care – PPO

## 2020-11-21 ENCOUNTER — Encounter: Payer: BC Managed Care – PPO | Admitting: Primary Care

## 2020-11-21 ENCOUNTER — Emergency Department
Admission: EM | Admit: 2020-11-21 | Discharge: 2020-11-21 | Disposition: A | Discharge status: Home / Self Care | Payer: BC Managed Care – PPO | Attending: Emergency Medicine | Admitting: Emergency Medicine

## 2020-11-21 ENCOUNTER — Telehealth (INDEPENDENT_AMBULATORY_CARE_PROVIDER_SITE_OTHER): Payer: BC Managed Care – PPO | Admitting: Primary Care

## 2020-11-21 ENCOUNTER — Ambulatory Visit (INDEPENDENT_AMBULATORY_CARE_PROVIDER_SITE_OTHER)
Admission: RE | Admit: 2020-11-21 | Discharge: 2020-11-21 | Disposition: A | Discharge status: Home / Self Care | Payer: BC Managed Care – PPO | Source: Ambulatory Visit | Attending: Primary Care | Admitting: Primary Care

## 2020-11-21 ENCOUNTER — Other Ambulatory Visit: Payer: Self-pay

## 2020-11-21 DIAGNOSIS — U071 COVID-19: Secondary | ICD-10-CM

## 2020-11-21 DIAGNOSIS — R0789 Other chest pain: Secondary | ICD-10-CM

## 2020-11-21 DIAGNOSIS — Z8616 Personal history of COVID-19: Secondary | ICD-10-CM | POA: Insufficient documentation

## 2020-11-21 DIAGNOSIS — R0609 Other forms of dyspnea: Secondary | ICD-10-CM

## 2020-11-21 DIAGNOSIS — R0602 Shortness of breath: Secondary | ICD-10-CM | POA: Diagnosis present

## 2020-11-21 DIAGNOSIS — J45909 Unspecified asthma, uncomplicated: Secondary | ICD-10-CM | POA: Insufficient documentation

## 2020-11-21 DIAGNOSIS — R0600 Dyspnea, unspecified: Secondary | ICD-10-CM

## 2020-11-21 HISTORY — DX: Personal history of COVID-19: Z86.16

## 2020-11-21 LAB — CBC
HCT: 41 % (ref 36.0–46.0)
Hemoglobin: 14.1 g/dL (ref 12.0–15.0)
MCH: 30 pg (ref 26.0–34.0)
MCHC: 34.4 g/dL (ref 30.0–36.0)
MCV: 87.2 fL (ref 80.0–100.0)
Platelets: 314 10*3/uL (ref 150–400)
RBC: 4.7 MIL/uL (ref 3.87–5.11)
RDW: 12.7 % (ref 11.5–15.5)
WBC: 8.8 10*3/uL (ref 4.0–10.5)
nRBC: 0 % (ref 0.0–0.2)

## 2020-11-21 LAB — BASIC METABOLIC PANEL
Anion gap: 8 (ref 5–15)
BUN: 11 mg/dL (ref 6–20)
CO2: 20 mmol/L — ABNORMAL LOW (ref 22–32)
Calcium: 8.9 mg/dL (ref 8.9–10.3)
Chloride: 109 mmol/L (ref 98–111)
Creatinine, Ser: 0.61 mg/dL (ref 0.44–1.00)
GFR, Estimated: >60 mL/min (ref >60)
Glucose, Bld: 99 mg/dL (ref 70–99)
Potassium: 3.5 mmol/L (ref 3.5–5.1)
Sodium: 137 mmol/L (ref 135–145)

## 2020-11-21 LAB — BRAIN NATRIURETIC PEPTIDE: B Natriuretic Peptide: 58.8 pg/mL (ref 0.0–100.0)

## 2020-11-21 LAB — TROPONIN I (HIGH SENSITIVITY): Troponin I (High Sensitivity): <2 ng/L (ref <18)

## 2020-11-21 LAB — FIBRIN DERIVATIVES D-DIMER (ARMC ONLY): Fibrin derivatives D-dimer (ARMC): 121.19 ng/mL (FEU) (ref 0.00–499.00)

## 2020-11-21 NOTE — Progress Notes (Signed)
Subjective:    Patient ID: Faith Jordan, female    DOB: 03/15/1996, 25 y.o.   MRN: [id number]  HPI  Virtual Visit via Video Note  I connected with Faith Jordan on 11/21/20 at  8:00 AM EST by a video enabled telemedicine application and verified that I am speaking with the correct person using two identifiers.  Location: Patient: Work Provider: Office Participants: Patient and myself   I discussed the limitations of evaluation and management by telemedicine and the availability of in person appointments. The patient expressed understanding and agreed to proceed.  History of Present Illness:  Faith Jordan is a 25 year old female with a history of migraines,dysautonomia, angioedema, neuro cardiogenic syncope, palpitations, fatigue who presents today with a chief complaint of shortness of breath.  Symptoms for Covid-19 began on 10/10/20, tested positive a few days later. Her bout of Covid "hit me pretty hard", experienced symptoms of sore throat, chest pressure, fever, SOB, cough.   Since her diagnosis she's never felt completely recovered but she has improved overall. About one week ago she started to feel "bad again" with symptoms of nasal congestion worsening fatigue, shortness of breath, chest pressure. She was evaluated at [facility] five days ago, negative chest xray, tested negative for influenza.   She continues to notice some SOB with exertion and chest pressure. Her stats are running 97%. She is on OCP's. She doesn't feel that she has a PE. She is an emergency department nurse.  Recently she had one of the physicians in her department listen to her lungs, she endorses that the physician heard "crackles" and recommended she go to the ED for evaluation.  She does not feel bad enough to go to the ED at this time.  She is afraid that she might be developing a pneumonia.  She was tested recently for COVID-19, and tested negative.   Observations/Objective:  Alert and  oriented. Appears well, not sickly. No distress. Speaking in complete sentences. No cough.  Assessment and Plan:  Ongoing symptoms since COVID-19 infection which initially occurred around Christmas 2021.  Her shortness of breath and chest pressure are concerning for PE, we discussed this in detail today.  She is on OCPs and had Covid which puts her at greater risk.  She does not feel that she has a PE, and currently declines my recommendation for CT angio chest.  We will start with a chest x-ray given the "crackles" that was noted on exam today by another provider.  If chest x-ray negative, and her symptoms persist then we need to move towards CT.  She will come this afternoon for her chest x-ray.  Follow Up Instructions:  Come by the office today for your chest x-ray as discussed.  If your chest pressure and shortness of breath gets worse, please go to the emergency department.  It was a pleasure to see you today! Allie Bossier, NP-C    I discussed the assessment and treatment plan with the patient. The patient was provided an opportunity to ask questions and all were answered. The patient agreed with the plan and demonstrated an understanding of the instructions.   The patient was advised to call back or seek an in-person evaluation if the symptoms worsen or if the condition fails to improve as anticipated.    Pleas Koch, NP      Review of Systems  Constitutional: Positive for fatigue. Negative for fever.  HENT: Positive for congestion. Negative for sore throat.  Respiratory: Positive for chest tightness and shortness of breath.   Neurological: Negative for headaches.       Past Medical History:  Diagnosis Date  . Acne   . Angioedema   . Aquagenic angio-edema-urticaria   . Asthma    no problems /not used recently  . Dysautonomia ([place])   . Dysrhythmia    sinus tachycardia  . Fibroid    right breast, adenoma  . Headache(784.0)   . Pneumonia    hx  6th  grade  . Vasculitis ([place])      Social History   Socioeconomic History  . Marital status: Single    Spouse name: Not on file  . Number of children: Not on file  . Years of education: Not on file  . Highest education level: Not on file  Occupational History  . Not on file  Tobacco Use  . Smoking status: Never Smoker  . Smokeless tobacco: Never Used  Vaping Use  . Vaping Use: Never used  Substance and Sexual Activity  . Alcohol use: No  . Drug use: No  . Sexual activity: Not on file  Other Topics Concern  . Not on file  Social History Narrative   Student at [employer], [place].    Enjoys reading.   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    Social Determinants of Health   Financial Resource Strain: Not on file  Food Insecurity: Not on file  Transportation Needs: Not on file  Physical Activity: Not on file  Stress: Not on file  Social Connections: Not on file  Intimate Partner Violence: Not on file    Past Surgical History:  Procedure Laterality Date  . ADENOIDECTOMY    . BREAST LUMPECTOMY Right   . BREAST LUMPECTOMY Right   . MASS EXCISION Right 10/07/2014   Procedure: EXCISION OF RIGHT BREAST MASS;  Surgeon: Jackolyn Confer, MD;  Location: [facility];  Service: General;  Laterality: Right;  . TONSILLECTOMY AND ADENOIDECTOMY    . TOOTH EXTRACTION      Family History  Problem Relation Age of Onset  . Breast cancer Maternal Grandmother 7  . Myasthenia gravis Maternal Grandfather   . Migraines Mother   . Narcolepsy Mother   . Hypothyroidism Mother   . Asthma Other   . Cancer Other   . Epilepsy Other   . Allergic rhinitis Neg Hx   . Angioedema Neg Hx   . Atopy Neg Hx   . Eczema Neg Hx   . Immunodeficiency Neg Hx   . Urticaria Neg Hx     Allergies  Allergen Reactions  . 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 Nsaids Dermatitis    Reaction similar to Stephens-Johnson, blistering peeling rash   . Sulfa Antibiotics Swelling    Facial swelling    Current Outpatient Medications on File Prior to Visit  Medication Sig Dispense Refill  . ALPRAZolam (XANAX) 0.25 MG tablet Take 0.25 mg by mouth daily as needed for anxiety.    . cyanocobalamin (,VITAMIN B-12,) 1000 MCG/ML injection Inject 1 ml intramuscularly every other week for 2 months, then once during month 3. 5 mL 0  . EPINEPHrine 0.3 mg/0.3 mL IJ SOAJ injection SMARTSIG:0.3 Milligram(s) IM Once PRN    . escitalopram (LEXAPRO) 10 MG tablet TAKE 1 TABLET (10 MG TOTAL) BY MOUTH DAILY. FOR ANXIETY. 90 tablet 1  . Galcanezumab-gnlm (EMGALITY) 120 MG/ML SOAJ INJECT 120 MG INTO THE SKIN EVERY  30 (THIRTY) DAYS. 1 mL 11  . Levonorgestrel-Ethinyl Estradiol (AMETHIA) 0.15-0.03 &0.01 MG tablet Take 1 tablet by mouth daily.    . propranolol (INDERAL) 20 MG tablet TAKE 1 TABLET BY MOUTH EVERY DAY MUST MAKE APPT FOR REFILLS 90 tablet 3  . topiramate (TOPAMAX) 50 MG tablet Take 1 tablet (50 mg total) by mouth daily. 90 tablet 1  . Ubrogepant (UBRELVY) 100 MG TABS Take 100 mg by mouth daily as needed. Take one tablet at onset of headache, may repeat 1 tablet in 2 hours, no more than 2 tablets in 24 hours 10 tablet 11   No current facility-administered medications on file prior to visit.    There were no vitals taken for this visit.   Objective:   Physical Exam Constitutional:      General: She is not in acute distress.    Appearance: She is not ill-appearing.  Pulmonary:     Effort: Pulmonary effort is normal.     Comments: No cough noted during exam. Neurological:     Mental Status: She is alert and oriented to person, place, and time.  Psychiatric:        Mood and Affect: Mood normal.            Assessment & Plan:

## 2020-11-21 NOTE — Assessment & Plan Note (Signed)
Ongoing symptoms since COVID-19 infection which initially occurred around Christmas 2021.  Her shortness of breath and chest pressure are concerning for PE, we discussed this in detail today.  She is on OCPs and had Covid which puts her at greater risk.  She does not feel that she has a PE, and currently declines my recommendation for CT angio chest.  We will start with a chest x-ray given the "crackles" that was noted on exam today by another provider.  If chest x-ray negative, and her symptoms persist then we need to move towards CT.  She will come this afternoon for her chest x-ray.

## 2020-11-21 NOTE — Patient Instructions (Signed)
Come by the office today for your chest x-ray as discussed.  If your chest pressure and shortness of breath gets worse, please go to the emergency department.  It was a pleasure to see you today! Allie Bossier, NP-C

## 2020-11-21 NOTE — ED Provider Notes (Signed)
[facility] Emergency Department Provider Note   ____________________________________________   None    (approximate)  I have reviewed the triage vital signs and the nursing notes.   HISTORY  Chief Complaint Shortness of Breath    HPI Faith Jordan is a 25 y.o. female history of sinus tachycardia, distant pneumonia, vasculitis  Patient presents today for concerns of ongoing feeling of chest pressure and shortness of breath abdomen ongoing for several days now since about Sunday  She was seen and evaluated by urgent care who did a chest x-ray, they advised her to come to the ER for consideration of a further work-up of shortness of breath.  No nausea vomiting.  No fevers or chills.  She is not coughing.  Just feels like she has a slight feeling of shortness of breath and a slight feeling of pressure in her chest since at least Sunday.  She had Covid and around Christmas time and the symptoms had gone away, but now experiencing some shortness of breath.  she tested herself and she tested negative for Covid today.   Additionally, she needed a ambulatory pulse ox and reports she never dropped her oxygen saturation below 95%.  Also reports she is treated for anxiety with Lexapro, and is hard for her to tell if she feels short of breath or if this might be feeling anxious  No history of heart disease.  No leg swelling.  Does take estrogen birth control.  Does not smoke.  No history of blood clots  Past Medical History:  Diagnosis Date  . Acne   . Angioedema   . Aquagenic angio-edema-urticaria   . Asthma    no problems /not used recently  . Dysautonomia ([place])   . Dysrhythmia    sinus tachycardia  . Fibroid    right breast, adenoma  . Headache(784.0)   . Pneumonia    hx  6th grade  . Vasculitis [facility])     Patient Active Problem List   Diagnosis Date Noted  . COVID-19 virus infection 11/21/2020  . GAD (generalized anxiety disorder) 03/18/2020  .  Fatigue 11/06/2019  . Chronic back pain 07/06/2019  . Urinary frequency 02/12/2019  . Preventative health care 04/27/2018  . Migraines 04/25/2018  . 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 Anemia 01/04/2018  . Angio-edema 04/27/2016  . Headache disorder 12/29/2015  . Acquired breast deformity 12/22/2015  . 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  . Sinus tachycardia 12/22/2015  . Hives 12/22/2015  . Dysautonomia ([place]) 12/22/2015  . 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 Neurocardiogenic syncope 04/06/2013  . Intermittent palpitations 04/06/2013    Past Surgical History:  Procedure Laterality Date  . ADENOIDECTOMY    . BREAST LUMPECTOMY Right   . BREAST LUMPECTOMY Right   . MASS EXCISION Right 10/07/2014   Procedure: EXCISION OF RIGHT BREAST MASS;  Surgeon: Jackolyn Confer, MD;  Location: [place];  Service: General;  Laterality: Right;  . TONSILLECTOMY AND ADENOIDECTOMY    . TOOTH EXTRACTION      Prior to Admission medications   Medication Sig Start Date End Date Taking? Authorizing Provider  ALPRAZolam Duanne Moron) 0.25 MG tablet Take 0.25 mg by mouth daily as needed for anxiety.    [provider]  cyanocobalamin (,VITAMIN B-12,) 1000 MCG/ML injection Inject 1 ml intramuscularly every other week for 2 months, then once during month 3. 11/07/19   Pleas Koch, NP  EPINEPHrine 0.3 mg/0.3 mL IJ  SOAJ injection SMARTSIG:0.3 Milligram(s) IM Once PRN 03/11/20   [provider]  escitalopram (LEXAPRO) 10 MG tablet TAKE 1 TABLET (10 MG TOTAL) BY MOUTH DAILY. FOR ANXIETY. 08/24/20   Pleas Koch, NP  Galcanezumab-gnlm (EMGALITY) 120 MG/ML SOAJ INJECT 120 MG INTO THE SKIN EVERY 30 (THIRTY) DAYS. 08/11/20   Lomax, Amy, NP  Levonorgestrel-Ethinyl Estradiol (AMETHIA) 0.15-0.03 &0.01 MG tablet Take 1 tablet by mouth daily. 05/04/19   [provider]  propranolol (INDERAL) 20 MG tablet TAKE 1 TABLET BY  MOUTH EVERY DAY MUST MAKE APPT FOR REFILLS 09/08/20   Croitoru, Mihai, MD  topiramate (TOPAMAX) 50 MG tablet Take 1 tablet (50 mg total) by mouth daily. 07/14/20   Lomax, Amy, NP  Ubrogepant (UBRELVY) 100 MG TABS Take 100 mg by mouth daily as needed. Take one tablet at onset of headache, may repeat 1 tablet in 2 hours, no more than 2 tablets in 24 hours 05/31/19   Lomax, Amy, NP    Allergies Advil [ibuprofen], Nsaids, and Sulfa antibiotics  Family History  Problem Relation Age of Onset  . Breast cancer Maternal Grandmother 17  . Myasthenia gravis Maternal Grandfather   . Migraines Mother   . Narcolepsy Mother   . Hypothyroidism Mother   . Asthma Other   . Cancer Other   . Epilepsy Other   . Allergic rhinitis Neg Hx   . Angioedema Neg Hx   . Atopy Neg Hx   . Eczema Neg Hx   . Immunodeficiency Neg Hx   . Urticaria Neg Hx     Social History Social History   Tobacco Use  . Smoking status: Never Smoker  . Smokeless tobacco: Never Used  Vaping Use  . Vaping Use: Never used  Substance Use Topics  . Alcohol use: No  . Drug use: No    Review of Systems Constitutional: No fever/chills Eyes: No visual changes.   Cardiovascular: Denies chest pain. Respiratory: Some mild to moderate feeling of shortness of breath throughout the last few days.  Nothing seems necessarily make it better or worse.  Notable while at work.  Does not noticeably worsen with exertion Gastrointestinal: No abdominal pain.  Denies pregnancy Genitourinary:  Musculoskeletal: Negative for back pain. Skin: Negative for rash except treatment for acne. Neurological: Negative for headaches or acute weakness    ____________________________________________   PHYSICAL EXAM:  VITAL SIGNS: ED Triage Vitals  Enc Vitals Group     BP 11/21/20 1900 130/90     Pulse Rate 11/21/20 1900 72     Resp 11/21/20 1900 18     Temp 11/21/20 1900 98.1 F (36.7 C)     Temp Source 11/21/20 1900 Oral     SpO2 11/21/20 1900  98 %     Weight 11/21/20 1901 140 lb (63.5 kg)     Height 11/21/20 1901 5\' 7"  (1.702 m)     Head Circumference --      Peak Flow --      Pain Score 11/21/20 1901 0     Pain Loc --      Pain Edu? --      Excl. in [place]? --     Constitutional: Alert and oriented. Well appearing and in no acute distress. Eyes: Conjunctivae are normal. Head: Atraumatic. Nose: No congestion/rhinnorhea. Mouth/Throat: Mucous membranes are moist. Neck: No stridor.  Cardiovascular: Normal rate, regular rhythm. Grossly normal heart sounds.  Good peripheral circulation. Respiratory: Normal respiratory effort.  No retractions. Lungs CTAB. Gastrointestinal: Soft and nontender.  No distention. Musculoskeletal: No lower extremity tenderness nor edema. Neurologic:  Normal speech and language. No gross focal neurologic deficits are appreciated.  Skin:  Skin is warm, dry and intact. No rash noted. Psychiatric: Mood and affect are normal. Speech and behavior are normal.  ____________________________________________   LABS (all labs ordered are listed, but only abnormal results are displayed)  Labs Reviewed  BASIC METABOLIC PANEL - Abnormal; Notable for the following components:      Result Value   CO2 20 (*)    All other components within normal limits  CBC  FIBRIN DERIVATIVES D-DIMER ([facility] ONLY)  BRAIN NATRIURETIC PEPTIDE  POC URINE PREG, ED  TROPONIN I (HIGH SENSITIVITY)   ____________________________________________  EKG  ED ECG REPORT I, Delman Kitten, the attending physician, personally viewed and interpreted this ECG.  Date: 11/21/2020 EKG Time: 1900 Rate: 70 Rhythm: normal sinus rhythm QRS Axis: normal Intervals: normal ST/T Wave abnormalities: normal Narrative Interpretation: no evidence of acute ischemia  ____________________________________________  RADIOLOGY  Reviewed patient's chest x-ray ordered through primary care today, I do not see acute findings.  Final radiology read is pending.   No cardiomegaly.  No infiltrates.  No consolidations.   ____________________________________________   PROCEDURES  Procedure(s) performed: None  Procedures  Critical Care performed: No  ____________________________________________   INITIAL IMPRESSION / ASSESSMENT AND PLAN / ED COURSE  Pertinent labs & imaging results that were available during my care of the patient were reviewed by me and considered in my medical decision making (see chart for details).   Patient presents for evaluation of dyspnea and slight chest pressure.  Ongoing since Sunday.  Recovered from Covid which she experienced at the end of December.  She denies any ongoing infectious symptoms such as fevers chills or cough.  There is no evidence of leg swelling JVD, or volume overload by exam.  Her imaging including chest x-ray from primary care is reviewed and looks good to me.  Given her symptoms and proximity to Covid as well as use of oral estrogen, decision made I discussed with the patient we will proceed with D-dimer.  If D-dimer is negative would refer for further follow-up with primary and possible follow-up with cardiology.  She has very reassuring evaluation, no hypoxia, normal work of breathing, and I do not see any signs of acute hypoxia or distress.  ----------------------------------------- 8:58 PM on 11/21/2020 -----------------------------------------  Normal D-dimer, normal troponin.  Lab work and evaluation here very reassuring.  Patient resting comfortably in hallway.  Friend at bedside.  Discussed with the patient reassuring results, she is comfortable with the plan for discharge and follow-up with PCP.  Also consideration for possible echocardiogram in the future if the symptoms persist.  Return precautions and treatment recommendations and follow-up discussed with the patient who is agreeable with the plan.       ____________________________________________   FINAL CLINICAL IMPRESSION(S) /  ED DIAGNOSES  Final diagnoses:  Other form of dyspnea        Note:  This document was prepared using Dragon voice recognition software and may include unintentional dictation errors       Delman Kitten, MD 11/21/20 2059

## 2020-11-21 NOTE — ED Triage Notes (Signed)
Pt reports that she was diagnosed with covid at the end of dec, states that she had been getting better, but recently has become more fatigued and sob has returned, pt states that even at rest she feels sob

## 2020-11-21 NOTE — ED Notes (Signed)
Pt states she had covid-19 this past December. Pt states she is currently having some shortness of breath and chest pressure, but denies chest pain. Significant other sitting with pt.

## 2020-11-23 NOTE — Progress Notes (Signed)
Duplicate. Error

## 2020-11-24 MED ORDER — BUSPIRONE HCL 5 MG PO TABS: 5.0000 mg | ORAL_TABLET | Freq: Two times a day (BID) | ORAL | 0 refills | Status: DC

## 2020-12-17 ENCOUNTER — Other Ambulatory Visit: Payer: Self-pay | Admitting: Primary Care

## 2020-12-17 DIAGNOSIS — F411 Generalized anxiety disorder: Secondary | ICD-10-CM

## 2020-12-17 NOTE — Telephone Encounter (Signed)
Patient may have had rash reaction to medication.  Will not refill BuSpar.

## 2020-12-25 ENCOUNTER — Other Ambulatory Visit: Payer: Self-pay | Admitting: Primary Care

## 2020-12-25 DIAGNOSIS — F411 Generalized anxiety disorder: Secondary | ICD-10-CM

## 2021-01-12 ENCOUNTER — Encounter: Payer: Self-pay | Admitting: Family Medicine

## 2021-01-12 NOTE — Progress Notes (Signed)
Cardiology Office Note   Date:  01/14/2021   ID:  Faith Jordan, DOB 01/07/1996, MRN [id number]  PCP:  Pleas Koch, NP  Cardiologist:  Quay Burow, MD EP: None  Chief Complaint  Patient presents with  . Shortness of Breath      History of Present Illness: Faith Jordan is a 25 y.o. female with a PMH of dysautonomia, migraines, anxiety, and COVID-19 infection 09/2020, who presents for the evaluation of SOB.   She was last evaluated by cardiology at an outpatient visit with Dr. Gwenlyn Found 07/2020, at which time she was reported to be doing well from a cardiology standpoint with improvement in her inappropriate sinus tachycardia with low dose propranolol. She has been seen by Dr. Caryl Comes in the past who agreed that patient had dysautonomia and encouraged hydration and exercise. Her last echocardiogram in 2019 showed EF 65-70%, normal LV diastolic function, no RWMA, and no significant valvular abnormalities.   Since her last visit with cardiology, she had COVID-19 09/2020. Unfortunately she has continued to experience SOB following recovery of her initial illness. She was seen by PCP and in the ED 11/21/20 with these complaints. CXR was without acute findings, BNP wnl, Ddimer and HsTrop were negative, and EKG was non-ischemic. She was recommended for ongoing outpatient evaluation, prompting her to schedule this visit.   She presents today with ongoing complaints of DOE. 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 She also notices SOB with talking, though never to the point that she is unable to speak in complete sentences. She notes some pressure in her chest associated with her SOB. She does note some mild orthopnea which improves with sleeping on her side, though no PND. She did have a couple days of positional dizziness ~1 month ago which resolved without recurrence. Otherwise no complaints of palpitations, LE edema, abdominal  bloating, nausea, vomiting, or chest pain.    Past Medical History:  Diagnosis Date  . Acne   . Angioedema   . Aquagenic angio-edema-urticaria   . Asthma    no problems /not used recently  . Dysautonomia ([place])   . Dysrhythmia    sinus tachycardia  . Fibroid    right breast, adenoma  . Headache(784.0)   . Pneumonia    hx  6th grade  . Vasculitis [facility])     Past Surgical History:  Procedure Laterality Date  . ADENOIDECTOMY    . BREAST LUMPECTOMY Right   . BREAST LUMPECTOMY Right   . MASS EXCISION Right 10/07/2014   Procedure: EXCISION OF RIGHT BREAST MASS;  Surgeon: Jackolyn Confer, MD;  Location: [place];  Service: General;  Laterality: Right;  . TONSILLECTOMY AND ADENOIDECTOMY    . TOOTH EXTRACTION       Current Outpatient Medications  Medication Sig Dispense Refill  . EPINEPHrine 0.3 mg/0.3 mL IJ SOAJ injection SMARTSIG:0.3 Milligram(s) IM Once PRN    . escitalopram (LEXAPRO) 10 MG tablet TAKE 1 TABLET (10 MG TOTAL) BY MOUTH DAILY. FOR ANXIETY. 90 tablet 1  . Galcanezumab-gnlm (EMGALITY) 120 MG/ML SOAJ INJECT 120 MG INTO THE SKIN EVERY 30 (THIRTY) DAYS. 1 mL 11  . Levonorgestrel-Ethinyl Estradiol (AMETHIA) 0.15-0.03 &0.01 MG tablet Take 1 tablet by mouth daily.    . propranolol (INDERAL) 20 MG tablet TAKE 1 TABLET BY MOUTH EVERY DAY MUST MAKE APPT FOR REFILLS 90 tablet 3  . spironolactone (ALDACTONE) 50 MG tablet Take 50 mg  by mouth in the morning and at bedtime.    . topiramate (TOPAMAX) 50 MG tablet Take 1 tablet (50 mg total) by mouth daily. 90 tablet 3  . Ubrogepant (UBRELVY) 100 MG TABS Take 100 mg by mouth daily as needed. Take one tablet at onset of headache, may repeat 1 tablet in 2 hours, no more than 2 tablets in 24 hours 10 tablet 11   No current facility-administered medications for this visit.    Allergies:   Advil [ibuprofen], Nsaids, and Sulfa antibiotics    Social History:  The patient  reports that she has never smoked. She has never used smokeless  tobacco. She reports that she does not drink alcohol and does not use drugs.   Family History:  The patient's family history includes Asthma in an other family member; Breast cancer (age of onset: 12) in her maternal grandmother; Cancer in an other family member; Epilepsy in an other family member; Hypothyroidism in her mother; Migraines in her mother; Myasthenia gravis in her maternal grandfather; Narcolepsy in her mother.    ROS:  Please see the history of present illness.   Otherwise, review of systems are positive for none.   All other systems are reviewed and negative.    PHYSICAL EXAM: VS:  BP 108/72   Pulse 73   Ht _0  (1.702 m)   Wt 146 lb 6.4 oz (66.4 kg)   SpO2 99%   BMI 22.93 kg/m  , BMI Body mass index is 22.93 kg/m. GEN: Well nourished, well developed, in no acute distress HEENT: sclera anicteric Neck: no JVD, carotid bruits, or masses Cardiac: RRR; no murmurs, rubs, or gallops, no edema  Respiratory:  clear to auscultation bilaterally, normal work of breathing GI: soft, nontender, nondistended, + BS MS: no deformity or atrophy Skin: warm and dry, no rash Neuro:  Strength and sensation are intact Psych: euthymic mood, full affect   EKG:  EKG is not ordered today.   Recent Labs: 11/21/2020: B Natriuretic Peptide 58.8; BUN 11; Creatinine, Ser 0.61; Hemoglobin 14.1; Platelets 314; Potassium 3.5; Sodium 137    Lipid Panel    Component Value Date/Time   CHOL 200 04/27/2018 1014   TRIG 153.0 (H) 04/27/2018 1014   HDL 57.80 04/27/2018 1014   CHOLHDL 3 04/27/2018 1014   VLDL 30.6 04/27/2018 1014   LDLCALC 111 (H) 04/27/2018 1014      Wt Readings from Last 3 Encounters:  01/14/21 146 lb 6.4 oz (66.4 kg)  08/01/20 136 lb (61.7 kg)  06/03/20 135 lb 12.8 oz (61.6 kg)      Other studies Reviewed: Additional studies/ records that were reviewed today include:   Echocardiogram 2019: - Left ventricle: The cavity size was normal. Systolic function was   vigorous. The estimated ejection fraction was in the range of 65%  to 70%.     ASSESSMENT AND PLAN:  1. 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  - Will check an echocardiogram to evaluate LV function - if negative, low threshold to refer to pulmonology for long-haul COVID symptoms.  - Will check ESR/CPR to evaluate for possible inflammatory process contributing to her DOE.   2. Dysautonomia: HR 73 today. No symptoms to suggest poor control recently - Continue propranolol  3. Acne: on spironolactone. Low threshold to lower dose if dizziness occurs more frequently.  - Continue management per derm/PCP   Current medicines are reviewed at length with the patient today.  The patient does not have concerns regarding medicines.  The following changes have been made:  As above  Labs/ tests ordered today include:   Orders Placed This Encounter  Procedures  . Sed Rate (ESR)  . C-reactive protein  . ECHOCARDIOGRAM COMPLETE     Disposition:   FU with me in 3 months  Signed, Abigail Butts, PA-C  01/14/2021 8:49 AM

## 2021-01-13 MED ORDER — TOPIRAMATE 50 MG PO TABS: 50.0000 mg | ORAL_TABLET | Freq: Every day | ORAL | 3 refills | Status: DC

## 2021-01-14 ENCOUNTER — Other Ambulatory Visit: Payer: Self-pay

## 2021-01-14 ENCOUNTER — Encounter: Payer: Self-pay | Admitting: Medical

## 2021-01-14 ENCOUNTER — Ambulatory Visit (INDEPENDENT_AMBULATORY_CARE_PROVIDER_SITE_OTHER): Payer: BC Managed Care – PPO | Admitting: Medical

## 2021-01-14 VITALS — BP 108/72 | HR 73 | Ht 67.0 in | Wt 146.4 lb

## 2021-01-14 DIAGNOSIS — U071 COVID-19: Secondary | ICD-10-CM | POA: Diagnosis not present

## 2021-01-14 DIAGNOSIS — G901 Familial dysautonomia [Riley-Day]: Secondary | ICD-10-CM | POA: Diagnosis not present

## 2021-01-14 DIAGNOSIS — R0600 Dyspnea, unspecified: Secondary | ICD-10-CM

## 2021-01-14 DIAGNOSIS — R0609 Other forms of dyspnea: Secondary | ICD-10-CM

## 2021-01-14 NOTE — Patient Instructions (Addendum)
Medication Instructions:  Continue current medications  *If you need a refill on your cardiac medications before your next appointment, please call your pharmacy*   Lab Work: ESR and CRP  Testing/Procedures: Your physician has requested that you have an echocardiogram. Echocardiography is a painless test that uses sound waves to create images of your heart. It provides your doctor with information about the size and shape of your heart and how well your heart's chambers and valves are working. This procedure takes approximately one hour. There are no restrictions for this procedure.    Follow-Up: At [facility], you and your health needs are our priority.  As part of our continuing mission to provide you with exceptional heart care, we have created designated Provider Care Teams.  These Care Teams include your primary Cardiologist (physician) and Advanced Practice Providers (APPs -  Physician Assistants and Nurse Practitioners) who all work together to provide you with the care you need, when you need it.  We recommend signing up for the patient portal called "MyChart".  Sign up information is provided on this After Visit Summary.  MyChart is used to connect with patients for Virtual Visits (Telemedicine).  Patients are able to view lab/test results, encounter notes, upcoming appointments, etc.  Non-urgent messages can be sent to your provider as well.   To learn more about what you can do with MyChart, go to [email or web address].    Your next appointment:   3 month(s)  The format for your next appointment:   In Person  Provider:   You will see one of the following Advanced Practice Providers on your designated Care Team:     Roby Lofts, [place]

## 2021-01-15 LAB — SEDIMENTATION RATE: Sed Rate: 3 mm/hr (ref 0–32)

## 2021-01-15 LAB — C-REACTIVE PROTEIN: CRP: 4 mg/L (ref 0–10)

## 2021-01-24 ENCOUNTER — Other Ambulatory Visit: Payer: Self-pay | Admitting: Primary Care

## 2021-01-24 DIAGNOSIS — F411 Generalized anxiety disorder: Secondary | ICD-10-CM

## 2021-02-03 ENCOUNTER — Encounter: Payer: Self-pay | Admitting: Primary Care

## 2021-02-03 ENCOUNTER — Other Ambulatory Visit: Payer: Self-pay

## 2021-02-03 ENCOUNTER — Ambulatory Visit (INDEPENDENT_AMBULATORY_CARE_PROVIDER_SITE_OTHER): Payer: BC Managed Care – PPO | Admitting: Primary Care

## 2021-02-03 DIAGNOSIS — F411 Generalized anxiety disorder: Secondary | ICD-10-CM

## 2021-02-03 DIAGNOSIS — Z8616 Personal history of COVID-19: Secondary | ICD-10-CM

## 2021-02-03 DIAGNOSIS — G43709 Chronic migraine without aura, not intractable, without status migrainosus: Secondary | ICD-10-CM | POA: Diagnosis not present

## 2021-02-03 DIAGNOSIS — R002 Palpitations: Secondary | ICD-10-CM | POA: Diagnosis not present

## 2021-02-03 MED ORDER — ESCITALOPRAM OXALATE 10 MG PO TABS: 15.0000 mg | ORAL_TABLET | Freq: Every day | ORAL | 1 refills | Status: DC

## 2021-02-03 NOTE — Progress Notes (Signed)
Subjective:    Patient ID: Faith Jordan, female    DOB: 1996/09/12, 25 y.o.   MRN: [id number]  HPI  Faith Jordan is a very pleasant 25 y.o. female with a history of GAD, migraines, sinus tachycardia, palpitations, ANS who presents today for follow up of anxiety and migraines.   She was last evaluated for anxiety in July 2021, endorsed several year history of uncontrolled anxiety, worse at the time. She didn't feel well on Zoloft, did feel better with Lexapro. Buspar was added several months ago, but unfortunately she reported in late February 2022 that Buspar was making her feel worse.  She is following with cardiology for chronic palpitations and managed on propranolol 20 mg daily. She is scheduled for an echocardiogram for late April 2022, if this is normal then it was recommended she see pulmonology for exertional dyspnea. She's struggled with exertional dyspnea since she had Covid-19. She questions whether her exertional dyspnea has caused anxiety.   She continues to struggle with "turning my brain off", feeling anxious. She is compliant to Lexapro 10 mg daily and felt well on this regimen for quite some time until recently.   Overall migraines have significantly improved since starting Emgality. Following with neurology. She continues to take Topamax 50 mg daily.   Review of Systems  Respiratory: Positive for shortness of breath.   Neurological: Negative for headaches.  Psychiatric/Behavioral: The patient is nervous/anxious.        See HPI         Past Medical History:  Diagnosis Date  . Acne   . Angioedema   . Aquagenic angio-edema-urticaria   . Asthma    no problems /not used recently  . Dysautonomia ([place])   . Dysrhythmia    sinus tachycardia  . Fibroid    right breast, adenoma  . Headache(784.0)   . Pneumonia    hx  6th grade  . Vasculitis ([place])     Social History   Socioeconomic History  . Marital status: Single    Spouse name: Not on file  . Number  of children: Not on file  . Years of education: Not on file  . Highest education level: Not on file  Occupational History  . Not on file  Tobacco Use  . Smoking status: Never Smoker  . Smokeless tobacco: Never Used  Vaping Use  . Vaping Use: Never used  Substance and Sexual Activity  . Alcohol use: No  . Drug use: No  . Sexual activity: Not on file  Other Topics Concern  . Not on file  Social History Narrative   Student at [employer], [place].    Enjoys reading.   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    Social Determinants of Health   Financial Resource Strain: Not on file  Food Insecurity: Not on file  Transportation Needs: Not on file  Physical Activity: Not on file  Stress: Not on file  Social Connections: Not on file  Intimate Partner Violence: Not on file    Past Surgical History:  Procedure Laterality Date  . ADENOIDECTOMY    . BREAST LUMPECTOMY Right   . BREAST LUMPECTOMY Right   . MASS EXCISION Right 10/07/2014   Procedure: EXCISION OF RIGHT BREAST MASS;  Surgeon: Jackolyn Confer, MD;  Location: [facility];  Service: General;  Laterality: Right;  . TONSILLECTOMY AND ADENOIDECTOMY    . TOOTH EXTRACTION      Family History  Problem Relation Age of Onset  .  Breast cancer Maternal Grandmother 76  . Myasthenia gravis Maternal Grandfather   . Migraines Mother   . Narcolepsy Mother   . Hypothyroidism Mother   . Asthma Other   . Cancer Other   . Epilepsy Other   . Allergic rhinitis Neg Hx   . Angioedema Neg Hx   . Atopy Neg Hx   . Eczema Neg Hx   . Immunodeficiency Neg Hx   . Urticaria Neg Hx     Allergies  Allergen Reactions  . 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 Nsaids Dermatitis    Reaction similar to Stephens-Johnson, blistering peeling rash  . Sulfa Antibiotics Swelling    Facial swelling    Current Outpatient Medications on File Prior to Visit  Medication Sig Dispense Refill   . EPINEPHrine 0.3 mg/0.3 mL IJ SOAJ injection SMARTSIG:0.3 Milligram(s) IM Once PRN    . Galcanezumab-gnlm (EMGALITY) 120 MG/ML SOAJ INJECT 120 MG INTO THE SKIN EVERY 30 (THIRTY) DAYS. 1 mL 11  . propranolol (INDERAL) 20 MG tablet TAKE 1 TABLET BY MOUTH EVERY DAY MUST MAKE APPT FOR REFILLS 90 tablet 3  . spironolactone (ALDACTONE) 50 MG tablet Take 50 mg by mouth in the morning and at bedtime.    . topiramate (TOPAMAX) 50 MG tablet Take 1 tablet (50 mg total) by mouth daily. 90 tablet 3  . Ubrogepant (UBRELVY) 100 MG TABS Take 100 mg by mouth daily as needed. Take one tablet at onset of headache, may repeat 1 tablet in 2 hours, no more than 2 tablets in 24 hours 10 tablet 11   No current facility-administered medications on file prior to visit.    BP 118/70   Pulse 79   Temp 97.8 F (36.6 C) (Temporal)   Ht 5\' 7"  (1.702 m)   Wt 152 lb 12 oz (69.3 kg)   SpO2 98%   BMI 23.92 kg/m  Objective:   Physical Exam Cardiovascular:     Rate and Rhythm: Normal rate and regular rhythm.  Pulmonary:     Effort: Pulmonary effort is normal.     Breath sounds: Normal breath sounds.  Musculoskeletal:     Cervical back: Neck supple.  Skin:    General: Skin is warm and dry.           Assessment & Plan:      This visit occurred during the SARS-CoV-2 public health emergency.  Safety protocols were in place, including screening questions prior to the visit, additional usage of staff PPE, and extensive cleaning of exam room while observing appropriate contact time as indicated for disinfecting solutions.

## 2021-02-03 NOTE — Assessment & Plan Note (Signed)
Overall doing well on propranolol 20 mg daily, continue same.

## 2021-02-03 NOTE — Assessment & Plan Note (Signed)
Deteriorated and seems to be secondary to Covid-19 infection several months ago.  Did not feel well on Zoloft, felt worse on Buspar. We decided to increase Lexapro to 15 mg daily for now, new Rx sent to pharmacy. She will update.

## 2021-02-03 NOTE — Patient Instructions (Signed)
We increased your escitalopram (Lexapro) to 15 mg, take 1 and 1/2 tablet daily.   Please update me in 3-4 weeks.   It was a pleasure to see you today!

## 2021-02-03 NOTE — Assessment & Plan Note (Signed)
Continues to experience PRN exertional dyspnea, following with cardiology and will undergo echocardiogram.

## 2021-02-03 NOTE — Assessment & Plan Note (Signed)
Significant improvement on Emgality injections, continue same.   Also continue Topamax 50 mg daily, and PRN Ubrelvy 100 mg. Follows with neurology.

## 2021-02-09 ENCOUNTER — Encounter: Payer: Self-pay | Admitting: Primary Care

## 2021-02-10 ENCOUNTER — Other Ambulatory Visit: Payer: Self-pay

## 2021-02-10 ENCOUNTER — Ambulatory Visit (HOSPITAL_COMMUNITY): Payer: BC Managed Care – PPO | Attending: Cardiology

## 2021-02-10 DIAGNOSIS — R0600 Dyspnea, unspecified: Secondary | ICD-10-CM | POA: Diagnosis not present

## 2021-02-10 DIAGNOSIS — R0609 Other forms of dyspnea: Secondary | ICD-10-CM

## 2021-02-10 LAB — ECHOCARDIOGRAM COMPLETE
Area-P 1/2: 4.41 cm2
S' Lateral: 2.5 cm

## 2021-02-11 NOTE — Telephone Encounter (Signed)
   Attempted to call patient to discuss echocardiogram results after reviewing her MyChart message. Please see echocardiogram results from 02/10/21 for further details. Patient instructed to send another MyChart message or call the office if any further questions.   Abigail Butts, PA-C 02/11/21; 3:49 PM

## 2021-04-22 ENCOUNTER — Ambulatory Visit: Payer: BC Managed Care – PPO | Admitting: Medical

## 2021-06-04 ENCOUNTER — Encounter: Payer: Self-pay | Admitting: Family Medicine

## 2021-06-04 ENCOUNTER — Telehealth: Payer: Self-pay | Admitting: *Deleted

## 2021-06-04 ENCOUNTER — Ambulatory Visit (INDEPENDENT_AMBULATORY_CARE_PROVIDER_SITE_OTHER): Payer: BC Managed Care – PPO | Admitting: Family Medicine

## 2021-06-04 VITALS — BP 113/76 | HR 73 | Ht 67.0 in | Wt 154.6 lb

## 2021-06-04 DIAGNOSIS — G43709 Chronic migraine without aura, not intractable, without status migrainosus: Secondary | ICD-10-CM

## 2021-06-04 MED ORDER — EMGALITY 120 MG/ML ~~LOC~~ SOAJ: SUBCUTANEOUS | 3 refills | Status: DC

## 2021-06-04 MED ORDER — UBRELVY 100 MG PO TABS: 100.0000 mg | ORAL_TABLET | Freq: Every day | ORAL | 11 refills | Status: DC | PRN

## 2021-06-04 MED ORDER — TOPIRAMATE 50 MG PO TABS: 50.0000 mg | ORAL_TABLET | Freq: Every day | ORAL | 3 refills | Status: DC

## 2021-06-04 NOTE — Telephone Encounter (Signed)
PA approved effective  06/04/2021 to 06/04/2022.

## 2021-06-04 NOTE — Patient Instructions (Addendum)
Below is our plan:  We will continue Emgality monthly and Ubrelvy as needed. Try to wean topiramate. Take '25mg'$  (1/2 tablet) every day for 2-3 weeks, then decrease to '25mg'$  every other day for 2 weeks then stop.   Please make sure you are staying well hydrated. I recommend 50-60 ounces daily. Well balanced diet and regular exercise encouraged. Consistent sleep schedule with 6-8 hours recommended.   Please continue follow up with care team as directed.   Follow up with me in 1 year  You may receive a survey regarding today's visit. I encourage you to leave honest feed back as I do use this information to improve patient care. Thank you for seeing me today!

## 2021-06-04 NOTE — Progress Notes (Signed)
PATIENT: Faith Jordan DOB: Jun 05, 1996  REASON FOR VISIT: follow up HISTORY FROM: patient  Chief Complaint  Patient presents with   Follow-up    New room, alone. Last seen 06/03/20. Doing well, no issues. Just needed yearly to continue getting med refills.      HISTORY OF PRESENT ILLNESS: 06/04/21 ALL: Faith Jordan returns for follow up for migraines. She continues Emgality, topiramate '50mg'$  daily and Ubrelvy as needed. She is doing very well. She rarely has headaches. She can not remember the last time she took [place]. She recently switched birth control to [place]. She is feeling well today and without concerns.   06/03/2020 ALL:  Faith Jordan is a 25 y.o. female here today for follow up for migraines. She continues topiramate, Emgality and propranolol for prevention. Roselyn Meier helps with migraine abortion. She reports that she is doing very well from a migraine perspective. She can not remember the last time she needed to take [place]. She may have 1-2 mild headaches just prior to due date for Emgality. 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 She is staying very busy.   HISTORY: (copied from my note on 05/31/2019)  Faith Jordan is a 25 y.o. female here today for follow up of migraines. She was restarted on topiramate 50 mg at night about 4 months ago. She continued Emgality injections and propranalol (originally prescribed for tachycardia).    She is taking Amethia for birth control. She is taking OCP continuously for three months with quarterly menstrual cycles. She reports that migraines are very well managed until the week of menstrual cycle. She states that migraine is usually retro orbital (usually right but can be on the left) with pounding and light/sound sensitivity. She has tried [place], Aleve, and Tylenol intermittently for acute management but does not feel anything helps. She can not tolerate triptans due to tachycardia. She has a reported allergy of  dermatitis with ibuprofen. She does not have any reaction with Aleve.    She is currently on her menstrual cycle. She has a terrible migraine that is mostly located behind her right eye. She does have mild pain of left forehead as well. She is requesting a nerve block today as this has worked well in the past.    HISTORY: (copied from my note on 01/16/2019)   Faith Jordan is a 25 y.o. female seen today for follow up.  She reports that her migraines have worsened specifically over the last month.  She feels that stress is definitely a trigger.  She is in school and finishing up with exams at this time.  She knows that weather also contributes.  She notices significant worsening around the time of her menstrual cycle.  Pain is typically right-sided retro-orbital pressure.  Occasionally there is throbbing, light sensitivity and nausea.  She continues Emgality every month.  She is using Roselyn Meier for abortive therapy that does help temporarily.  She is also on propranolol for tachycardia.  She reports a history of syncope as well that is followed closely by her cardiologist.   She has tried topiramate in the past but is unsure why this medication was stopped.  She is also tried imipemide but reports that it did not help.  She has taken Maxalt and Imitrex for abortive therapy but reports tachycardia with a triptan therapy.  She is allergic to NSAIDs.   REVIEW OF SYSTEMS: Out of a complete 14 system review of symptoms, the patient complains  only of the following symptoms, none and all other reviewed systems are negative.  ALLERGIES: 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, blistering peeling skin   Nsaids Dermatitis    Reaction similar to Stephens-Johnson, blistering peeling rash   Sulfa Antibiotics Swelling    Facial swelling    HOME MEDICATIONS: Outpatient Medications Prior to Visit  Medication Sig Dispense Refill   buPROPion  (WELLBUTRIN XL) 150 MG 24 hr tablet Take 150 mg by mouth daily.     EPINEPHrine 0.3 mg/0.3 mL IJ SOAJ injection SMARTSIG:0.3 Milligram(s) IM Once PRN     escitalopram (LEXAPRO) 10 MG tablet Take 1.5 tablets (15 mg total) by mouth daily. For anxiety. 135 tablet 1   propranolol (INDERAL) 20 MG tablet TAKE 1 TABLET BY MOUTH EVERY DAY MUST MAKE APPT FOR REFILLS 90 tablet 3   spironolactone (ALDACTONE) 50 MG tablet Take 50 mg by mouth in the morning and at bedtime.     Galcanezumab-gnlm (EMGALITY) 120 MG/ML SOAJ INJECT 120 MG INTO THE SKIN EVERY 30 (THIRTY) DAYS. 1 mL 11   topiramate (TOPAMAX) 50 MG tablet Take 1 tablet (50 mg total) by mouth daily. 90 tablet 3   Ubrogepant (UBRELVY) 100 MG TABS Take 100 mg by mouth daily as needed. Take one tablet at onset of headache, may repeat 1 tablet in 2 hours, no more than 2 tablets in 24 hours 10 tablet 11   No facility-administered medications prior to visit.    PAST MEDICAL HISTORY: 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   Angio-edema 04/27/2016   Angioedema    Aquagenic angio-edema-urticaria    Asthma    no problems /not used recently   Dysautonomia ([place])    Dysrhythmia    sinus tachycardia   Fibroid    right breast, adenoma   Headache(784.0)    Hives 12/22/2015   Pneumonia    hx  6th grade   Vasculitis ([place])     PAST SURGICAL HISTORY: Past Surgical History:  Procedure Laterality Date   ADENOIDECTOMY     BREAST LUMPECTOMY Right    BREAST LUMPECTOMY Right    MASS EXCISION Right 10/07/2014   Procedure: EXCISION OF RIGHT BREAST MASS;  Surgeon: Jackolyn Confer, MD;  Location: [facility];  Service: General;  Laterality: Right;   TONSILLECTOMY AND ADENOIDECTOMY     TOOTH EXTRACTION      FAMILY HISTORY: Family History  Problem Relation Age of Onset   Breast cancer Maternal Grandmother 7   Myasthenia gravis Maternal Grandfather    Migraines Mother    Narcolepsy Mother    Hypothyroidism Mother     Asthma Other    Cancer Other    Epilepsy Other    Allergic rhinitis Neg Hx    Angioedema Neg Hx    Atopy Neg Hx    Eczema Neg Hx    Immunodeficiency Neg Hx    Urticaria Neg Hx     SOCIAL HISTORY: Social History   Socioeconomic History   Marital status: Single    Spouse name: Not on file   Number of children: Not on file   Years of education: Not on file   Highest education level: Not on file  Occupational History   Not on file  Tobacco Use   Smoking status: Never   Smokeless tobacco: Never  Vaping Use   Vaping Use: Never used  Substance and Sexual Activity  Alcohol use: No   Drug use: No   Sexual activity: Not on file  Other Topics Concern   Not on file  Social History Narrative   Student at [employer], [place].    Enjoys reading.   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    Social Determinants of Health   Financial Resource Strain: Not on file  Food Insecurity: Not on file  Transportation Needs: Not on file  Physical Activity: Not on file  Stress: Not on file  Social Connections: Not on file  Intimate Partner Violence: Not on file      PHYSICAL EXAM  Vitals:   06/04/21 1439  BP: 113/76  Pulse: 73  Weight: 154 lb 9.6 oz (70.1 kg)  Height: '5\' 7"'$  (1.702 m)    Body mass index is 24.21 kg/m.  Generalized: Well developed, in no acute distress  Cardiology: normal rate and rhythm, no murmur noted Respiratory: clear to auscultation bilaterally  Neurological examination  Mentation: Alert oriented to time, place, history taking. Follows all commands speech and language fluent Cranial nerve II-XII: Pupils were equal round reactive to light. Extraocular movements were full, visual field were full  Motor: The motor testing reveals 5 over 5 strength of all 4 extremities. Good symmetric motor tone is noted throughout.  Gait and station: Gait is normal.   DIAGNOSTIC DATA (LABS, IMAGING, TESTING) - I reviewed patient records, labs, notes, testing and  imaging myself where available.  No flowsheet data found.   Lab Results  Component Value Date   WBC 8.8 11/21/2020   HGB 14.1 11/21/2020   HCT 41.0 11/21/2020   MCV 87.2 11/21/2020   PLT 314 11/21/2020      Component Value Date/Time   NA 137 11/21/2020 1909   NA 142 09/28/2017 1459   K 3.5 11/21/2020 1909   CL 109 11/21/2020 1909   CO2 20 (L) 11/21/2020 1909   GLUCOSE 99 11/21/2020 1909   BUN 11 11/21/2020 1909   BUN 3 (L) 09/28/2017 1459   CREATININE 0.61 11/21/2020 1909   CALCIUM 8.9 11/21/2020 1909   PROT 6.7 11/06/2019 0855   PROT 6.5 09/28/2017 1459   ALBUMIN 4.2 11/06/2019 0855   ALBUMIN 4.0 09/28/2017 1459   AST 11 11/06/2019 0855   ALT 9 11/06/2019 0855   ALKPHOS 43 11/06/2019 0855   BILITOT 0.5 11/06/2019 0855   BILITOT 0.2 09/28/2017 1459   GFRNONAA >60 11/21/2020 1909   GFRAA >60 12/01/2017 1018   Lab Results  Component Value Date   CHOL 200 04/27/2018   HDL 57.80 04/27/2018   LDLCALC 111 (H) 04/27/2018   TRIG 153.0 (H) 04/27/2018   CHOLHDL 3 04/27/2018   No results found for: HGBA1C Lab Results  Component Value Date   VITAMINB12 251 02/05/2020   Lab Results  Component Value Date   TSH 1.58 11/06/2019       ASSESSMENT AND PLAN 25 y.o. year old female  has a past medical history of Acne, Acquired breast deformity (12/22/2015), Angio-edema (04/27/2016), Angioedema, Aquagenic angio-edema-urticaria, Asthma, Dysautonomia ([place]), Dysrhythmia, Fibroid, Headache(784.0), Hives (12/22/2015), Pneumonia, and Vasculitis ([place]). here with     ICD-10-CM   1. Chronic migraine without aura without status migrainosus, not intractable  G43.709        Faith Jordan is doing very well. Headaches are well managed on current treatment plan. We will continue Emgality monthly for prevention as well as Ubrelvy for for prevention. I will have her wean topiramate to '25mg'$  daily for 2-3  weeks then '25mg'$  every other day for 2-3 weeks then stop. May resume if headaches worsen. She will  continue propranolol '20mg'$  daily for tachycardia, managed by cardiology. Advised against pregnancy. She will continue healthy lifestyle habits. She will follow up with me in 1 year, sooner if needed.    No orders of the defined types were placed in this encounter.    Meds ordered this encounter  Medications   Galcanezumab-gnlm (EMGALITY) 120 MG/ML SOAJ    Sig: INJECT 120 MG INTO THE SKIN EVERY 30 (THIRTY) DAYS.    Dispense:  3 mL    Refill:  3    Order Specific Question:   Supervising Provider    Answer:   Melvenia Beam [id number]   topiramate (TOPAMAX) 50 MG tablet    Sig: Take 1 tablet (50 mg total) by mouth daily.    Dispense:  90 tablet    Refill:  3    Order Specific Question:   Supervising Provider    Answer:   Melvenia Beam [id number]   Ubrogepant (UBRELVY) 100 MG TABS    Sig: Take 100 mg by mouth daily as needed. Take one tablet at onset of headache, may repeat 1 tablet in 2 hours, no more than 2 tablets in 24 hours    Dispense:  10 tablet    Refill:  11    Order Specific Question:   Supervising Provider    Answer:   Melvenia Beam [id number]       [place], FNP-C 06/04/2021, 3:04 PM [facility] [address], [place], [place] [address] [phone]

## 2021-06-04 NOTE — Telephone Encounter (Signed)
Submitted PA Ubrelvy on [facility]. Key: [id number]. Waiting on determination from [place].

## 2021-06-27 ENCOUNTER — Other Ambulatory Visit: Payer: Self-pay | Admitting: Cardiovascular Disease

## 2021-06-27 ENCOUNTER — Other Ambulatory Visit: Payer: Self-pay | Admitting: Primary Care

## 2021-06-27 DIAGNOSIS — F411 Generalized anxiety disorder: Secondary | ICD-10-CM

## 2021-07-09 DIAGNOSIS — F411 Generalized anxiety disorder: Secondary | ICD-10-CM

## 2021-07-10 MED ORDER — ESCITALOPRAM OXALATE 20 MG PO TABS: 20.0000 mg | ORAL_TABLET | Freq: Every day | ORAL | 0 refills | Status: DC

## 2021-07-30 ENCOUNTER — Ambulatory Visit: Payer: BC Managed Care – PPO | Admitting: Primary Care

## 2021-07-30 ENCOUNTER — Encounter: Payer: Self-pay | Admitting: Primary Care

## 2021-07-30 ENCOUNTER — Ambulatory Visit (INDEPENDENT_AMBULATORY_CARE_PROVIDER_SITE_OTHER): Payer: BC Managed Care – PPO | Admitting: Primary Care

## 2021-07-30 ENCOUNTER — Other Ambulatory Visit: Payer: Self-pay

## 2021-07-30 VITALS — BP 110/68 | HR 69 | Temp 98.0°F | Ht 67.0 in | Wt 156.0 lb

## 2021-07-30 DIAGNOSIS — E559 Vitamin D deficiency, unspecified: Secondary | ICD-10-CM | POA: Insufficient documentation

## 2021-07-30 DIAGNOSIS — Z1159 Encounter for screening for other viral diseases: Secondary | ICD-10-CM

## 2021-07-30 DIAGNOSIS — R000 Tachycardia, unspecified: Secondary | ICD-10-CM

## 2021-07-30 DIAGNOSIS — Z114 Encounter for screening for human immunodeficiency virus [HIV]: Secondary | ICD-10-CM | POA: Diagnosis not present

## 2021-07-30 DIAGNOSIS — R55 Syncope and collapse: Secondary | ICD-10-CM

## 2021-07-30 DIAGNOSIS — Z8349 Family history of other endocrine, nutritional and metabolic diseases: Secondary | ICD-10-CM

## 2021-07-30 DIAGNOSIS — F411 Generalized anxiety disorder: Secondary | ICD-10-CM | POA: Diagnosis not present

## 2021-07-30 DIAGNOSIS — E538 Deficiency of other specified B group vitamins: Secondary | ICD-10-CM

## 2021-07-30 DIAGNOSIS — G43709 Chronic migraine without aura, not intractable, without status migrainosus: Secondary | ICD-10-CM

## 2021-07-30 DIAGNOSIS — G901 Familial dysautonomia [Riley-Day]: Secondary | ICD-10-CM

## 2021-07-30 LAB — VITAMIN D 25 HYDROXY (VIT D DEFICIENCY, FRACTURES): VITD: 40.35 ng/mL (ref 30.00–100.00)

## 2021-07-30 LAB — FOLLICLE STIMULATING HORMONE: FSH: 2 m[IU]/mL

## 2021-07-30 LAB — LUTEINIZING HORMONE: LH: 3.97 m[IU]/mL

## 2021-07-30 LAB — TSH: TSH: 0.83 u[IU]/mL (ref 0.35–5.50)

## 2021-07-30 LAB — T4, FREE: Free T4: 0.72 ng/dL (ref 0.60–1.60)

## 2021-07-30 LAB — VITAMIN B12: Vitamin B-12: 373 pg/mL (ref 211–911)

## 2021-07-30 MED ORDER — ESCITALOPRAM OXALATE 20 MG PO TABS
20.0000 mg | ORAL_TABLET | Freq: Every day | ORAL | 3 refills | Status: DC
Start: 2021-07-30 — End: 2022-02-11

## 2021-07-30 NOTE — Assessment & Plan Note (Signed)
Following with cardiology. 

## 2021-07-30 NOTE — Assessment & Plan Note (Signed)
Stable and doing well on current regimen. Following with Neurology.  Continue Topamax 50 mg HS, Emgality monthly, Ubrevly 100 mg PRN.

## 2021-07-30 NOTE — Progress Notes (Signed)
Subjective:    Patient ID: Faith Jordan, female    DOB: 18-Jul-1996, 25 y.o.   MRN: [id number]  HPI  Faith Jordan is a very pleasant 25 y.o. female with a history of GAD, migraines, fatigue, sinus tachycardia, dysautonomia who presents today for follow up and medication refills.  Following with neurology for migraines, managed on Topamax 50 mg HS, Emgality monthly, and Ubrevly 100 mg PRN. Overall feels well managed. Tried to wean off Topamax but this caused an increase in migraines.   Managed on Lexapro 20 mg for chronic anxiety and bupropion XL 150 mg for depression. Dose of Lexapro increased from 10 mg to 20 mg per GYN in Summer 2022 due to increased symptoms after IUD was placed. Also bupropion XL 150 mg was added by GYN for same reasons. Overall she's feeling better than this Summer, not quite herself. She would like hormone levels checked.   Following with cardiology for sinus tachycardia and dysautonomia, managed on propranolol 20 mg daily.   She would like to have her B12 and vitamin D levels checked as well as hormone levels. She has a strong family history of hypothyroidism.   BP Readings from Last 3 Encounters:  07/30/21 110/68  06/04/21 113/76  02/03/21 118/70      Review of Systems  Cardiovascular:  Negative for palpitations.  Neurological:  Negative for headaches.  Psychiatric/Behavioral:         See HPI        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   Angio-edema 04/27/2016   Angioedema    Aquagenic angio-edema-urticaria    Asthma    no problems /not used recently   Dysautonomia ([place])    Dysrhythmia    sinus tachycardia   Fibroid    right breast, adenoma   Headache(784.0)    Hives 12/22/2015   Pneumonia    hx  6th grade   Vasculitis ([facility])     Social History   Socioeconomic History   Marital status: Single    Spouse name: Not on file   Number of children: Not on file   Years of education: Not on  file   Highest education level: Not on file  Occupational History   Not on file  Tobacco Use   Smoking status: Never   Smokeless tobacco: Never  Vaping Use   Vaping Use: Never used  Substance and Sexual Activity   Alcohol use: No   Drug use: No   Sexual activity: Not on file  Other Topics Concern   Not on file  Social History Narrative   Student at [employer], [place].    Enjoys reading.   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    Social Determinants of Health   Financial Resource Strain: Not on file  Food Insecurity: Not on file  Transportation Needs: Not on file  Physical Activity: Not on file  Stress: Not on file  Social Connections: Not on file  Intimate Partner Violence: Not on file    Past Surgical History:  Procedure Laterality Date   ADENOIDECTOMY     BREAST LUMPECTOMY Right    BREAST LUMPECTOMY Right    MASS EXCISION Right 10/07/2014   Procedure: EXCISION OF RIGHT BREAST MASS;  Surgeon: Jackolyn Confer, MD;  Location: [facility];  Service: General;  Laterality: Right;   TONSILLECTOMY AND ADENOIDECTOMY     TOOTH EXTRACTION      Family History  Problem Relation Age of Onset   Breast cancer Maternal Grandmother 35   Myasthenia gravis Maternal Grandfather    Migraines Mother    Narcolepsy Mother    Hypothyroidism Mother    Asthma Other    Cancer Other    Epilepsy Other    Allergic rhinitis Neg Hx    Angioedema Neg Hx    Atopy Neg Hx    Eczema Neg Hx    Immunodeficiency Neg Hx    Urticaria Neg Hx     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, blistering peeling skin   Nsaids Dermatitis    Reaction similar to Stephens-Johnson, blistering peeling rash   Sulfa Antibiotics Swelling    Facial swelling    Current Outpatient Medications on File Prior to Visit  Medication Sig Dispense Refill   buPROPion (WELLBUTRIN XL) 150 MG 24 hr tablet Take 150 mg by mouth daily.     EPINEPHrine 0.3  mg/0.3 mL IJ SOAJ injection SMARTSIG:0.3 Milligram(s) IM Once PRN     escitalopram (LEXAPRO) 20 MG tablet Take 1 tablet (20 mg total) by mouth daily. 30 tablet 0   Galcanezumab-gnlm (EMGALITY) 120 MG/ML SOAJ INJECT 120 MG INTO THE SKIN EVERY 30 (THIRTY) DAYS. 3 mL 3   propranolol (INDERAL) 20 MG tablet TAKE 1 TABLET BY MOUTH EVERY DAY MUST MAKE APPT FOR REFILLS 90 tablet 3   spironolactone (ALDACTONE) 50 MG tablet Take 50 mg by mouth in the morning and at bedtime.     topiramate (TOPAMAX) 50 MG tablet Take 1 tablet (50 mg total) by mouth daily. 90 tablet 3   Ubrogepant (UBRELVY) 100 MG TABS Take 100 mg by mouth daily as needed. Take one tablet at onset of headache, may repeat 1 tablet in 2 hours, no more than 2 tablets in 24 hours 10 tablet 11   No current facility-administered medications on file prior to visit.    BP 110/68   Pulse 69   Temp 98 F (36.7 C) (Temporal)   Ht 5\' 7"  (1.702 m)   Wt 156 lb (70.8 kg)   SpO2 98%   BMI 24.43 kg/m  Objective:   Physical Exam Constitutional:      General: She is not in acute distress. Cardiovascular:     Rate and Rhythm: Normal rate and regular rhythm.  Pulmonary:     Effort: Pulmonary effort is normal.     Breath sounds: Normal breath sounds.  Musculoskeletal:     Cervical back: Neck supple.  Skin:    General: Skin is warm and dry.  Psychiatric:        Mood and Affect: Mood normal.          Assessment & Plan:      This visit occurred during the SARS-CoV-2 public health emergency.  Safety protocols were in place, including screening questions prior to the visit, additional usage of staff PPE, and extensive cleaning of exam room while observing appropriate contact time as indicated for disinfecting solutions.

## 2021-07-30 NOTE — Assessment & Plan Note (Signed)
Improved with dose increase of Lexapro to 20 mg and addition of bupropion XL 150 mg. Continue same.

## 2021-07-30 NOTE — Assessment & Plan Note (Signed)
In sister and maternal cousin.  Checking TSH, Free T4, TPO. Prior labs reviewed and are negative.

## 2021-07-30 NOTE — Assessment & Plan Note (Signed)
Repeat levels pending.

## 2021-07-30 NOTE — Assessment & Plan Note (Signed)
Following with cardiology. Continue propranolol 20 mg.

## 2021-07-30 NOTE — Assessment & Plan Note (Signed)
Following with cardiology, continue propranolol 20 mg daily.

## 2021-07-30 NOTE — Patient Instructions (Addendum)
Stop by the lab prior to leaving today. I will notify you of your results once received.   It was a pleasure to see you today!  

## 2021-08-06 LAB — HIV ANTIBODY (ROUTINE TESTING W REFLEX): HIV 1&2 Ab, 4th Generation: NONREACTIVE

## 2021-08-06 LAB — HEPATITIS C ANTIBODY
Hepatitis C Ab: NONREACTIVE
SIGNAL TO CUT-OFF: 0.05 (ref <1.00)

## 2021-08-06 LAB — ESTROGENS, TOTAL: Estrogen: 612.1 pg/mL — ABNORMAL HIGH

## 2021-08-06 LAB — THYROID PEROXIDASE ANTIBODY: Thyroperoxidase Ab SerPl-aCnc: 1 IU/mL (ref <9)

## 2021-08-21 IMAGING — DX DG CHEST 2V
2 series · 2 of 2 positions shown · non-contrast
Comparison: Chest radiographs from 05/05/2017.

CLINICAL DATA: Shortness of breath, chest pressure.

EXAM:
CHEST - 2 VIEW

[chest pa]
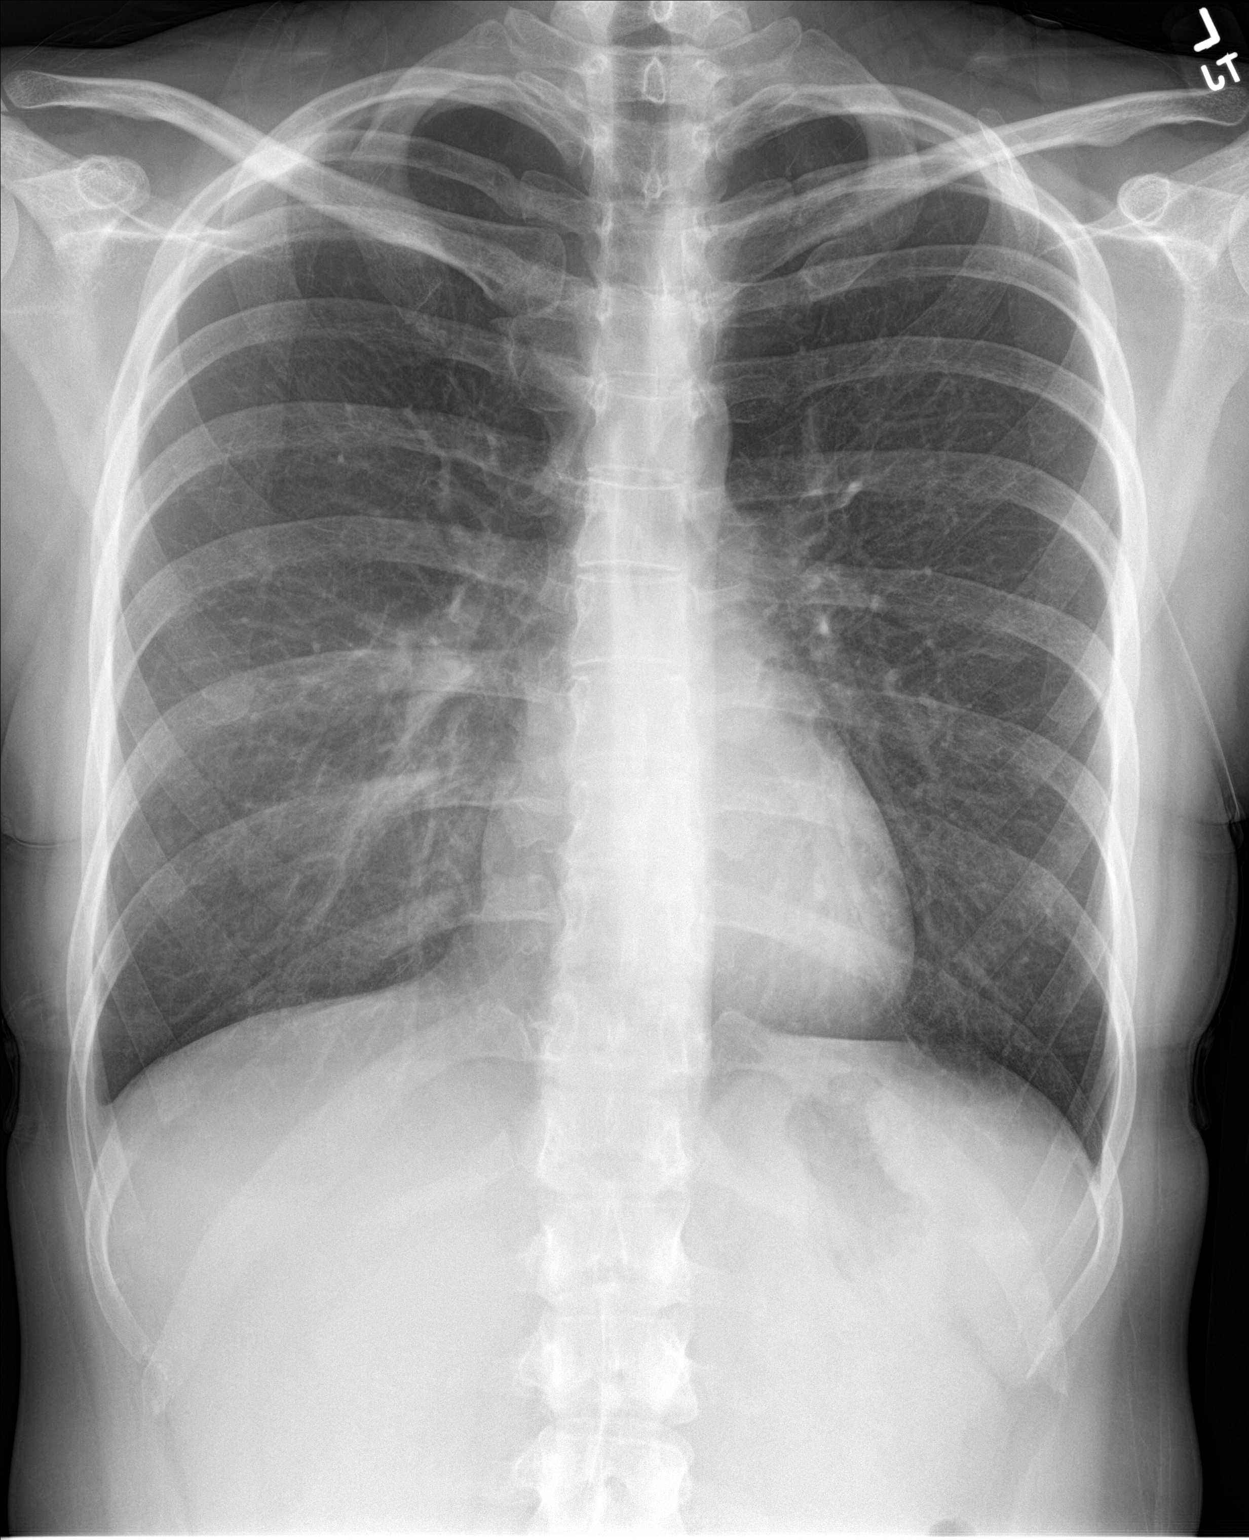

[chest lat]
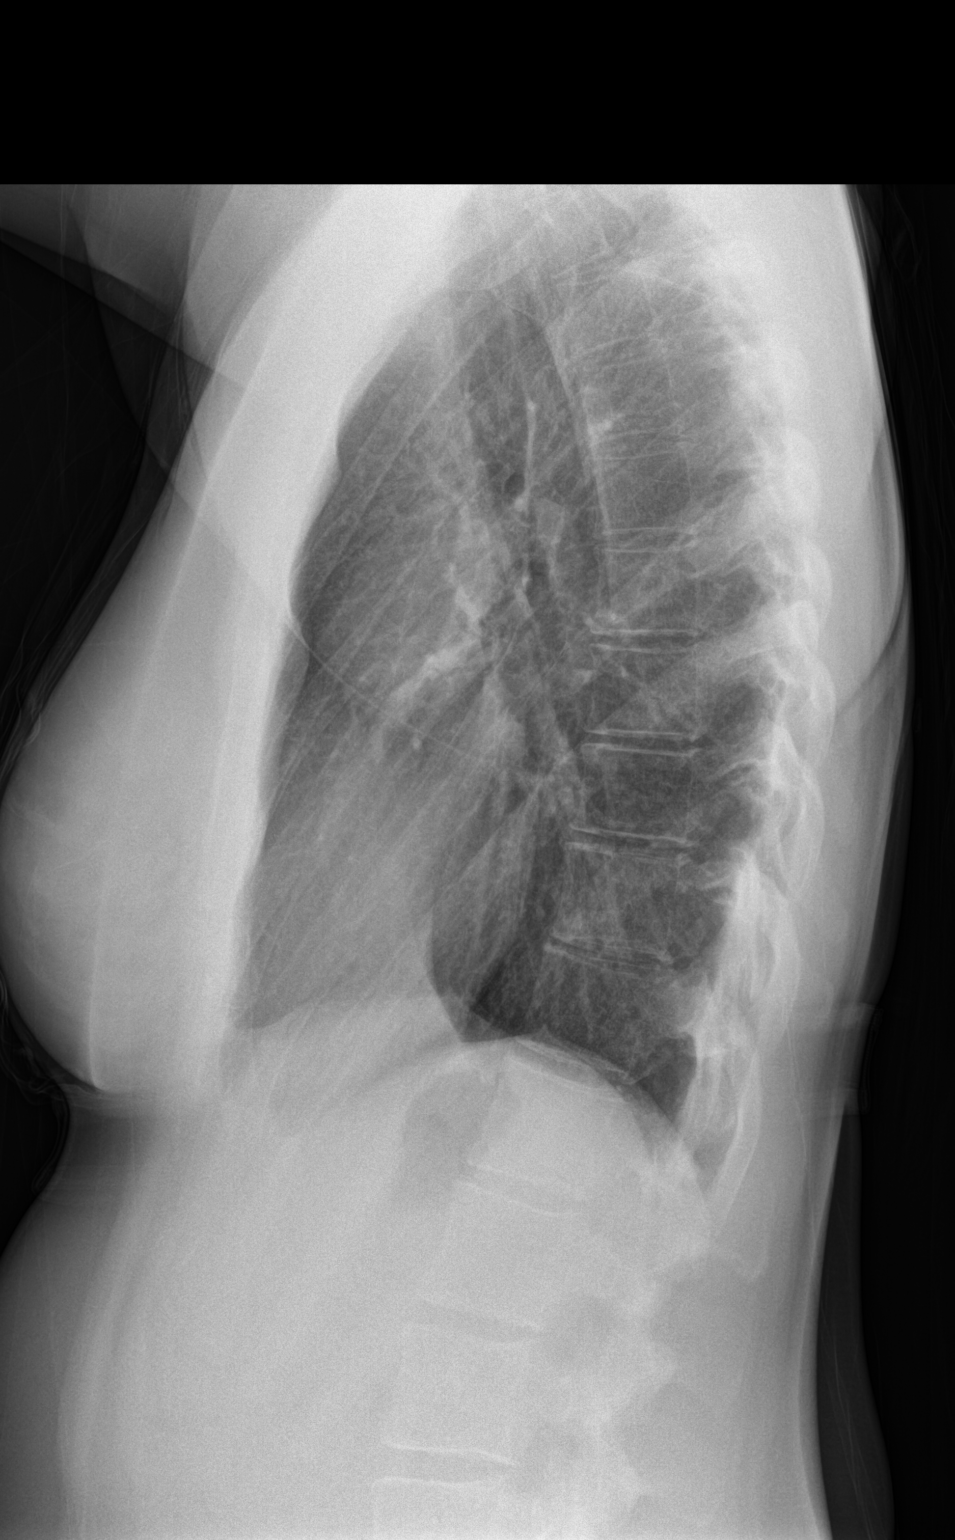

[2 of 2 positions shown; findings below may reference images not displayed]

FINDINGS: The heart size and mediastinal contours are within normal limits.
Both lungs are clear. No visible pleural effusions or pneumothorax.
No acute osseous abnormality.
IMPRESSION: No active cardiopulmonary disease.

## 2021-09-07 DIAGNOSIS — J069 Acute upper respiratory infection, unspecified: Secondary | ICD-10-CM

## 2021-09-07 DIAGNOSIS — F411 Generalized anxiety disorder: Secondary | ICD-10-CM

## 2021-09-21 ENCOUNTER — Encounter: Payer: Self-pay | Admitting: Physician Assistant

## 2021-09-21 ENCOUNTER — Telehealth: Payer: BC Managed Care – PPO | Admitting: Physician Assistant

## 2021-09-21 DIAGNOSIS — U071 COVID-19: Secondary | ICD-10-CM

## 2021-09-21 MED ORDER — MOLNUPIRAVIR EUA 200MG CAPSULE: 4.0000 | ORAL_CAPSULE | Freq: Two times a day (BID) | ORAL | 0 refills | Status: AC

## 2021-09-21 MED ORDER — ONDANSETRON HCL 4 MG PO TABS: 4.0000 mg | ORAL_TABLET | Freq: Three times a day (TID) | ORAL | 0 refills | Status: DC | PRN

## 2021-09-21 MED ORDER — ONDANSETRON HCL 4 MG PO TABS
4.0000 mg | ORAL_TABLET | Freq: Three times a day (TID) | ORAL | 0 refills | Status: DC | PRN
Start: 2021-09-21 — End: 2022-03-17

## 2021-09-21 MED ORDER — MOLNUPIRAVIR EUA 200MG CAPSULE: 4.0000 | ORAL_CAPSULE | Freq: Two times a day (BID) | ORAL | 0 refills | Status: DC

## 2021-09-21 MED ORDER — MOLNUPIRAVIR EUA 200MG CAPSULE
4.0000 | ORAL_CAPSULE | Freq: Two times a day (BID) | ORAL | 0 refills | Status: AC
Start: 2021-09-21 — End: 2021-09-26

## 2021-09-21 NOTE — Patient Instructions (Signed)
[name], thank you for joining Mar Daring, PA-C for today's virtual visit.  While this provider is not your primary care provider (PCP), if your PCP is located in our provider database this encounter information will be shared with them immediately following your visit.  Consent: (Patient) Faith Jordan provided verbal consent for this virtual visit at the beginning of the encounter.  Current Medications:  Current Outpatient Medications:    molnupiravir EUA (LAGEVRIO) 200 mg CAPS capsule, Take 4 capsules (800 mg total) by mouth 2 (two) times daily for 5 days., Disp: 40 capsule, Rfl: 0   ondansetron (ZOFRAN) 4 MG tablet, Take 1 tablet (4 mg total) by mouth every 8 (eight) hours as needed for nausea or vomiting., Disp: 20 tablet, Rfl: 0   buPROPion (WELLBUTRIN XL) 150 MG 24 hr tablet, Take 150 mg by mouth daily., Disp: , Rfl:    EPINEPHrine 0.3 mg/0.3 mL IJ SOAJ injection, SMARTSIG:0.3 Milligram(s) IM Once PRN, Disp: , Rfl:    escitalopram (LEXAPRO) 20 MG tablet, Take 1 tablet (20 mg total) by mouth daily. For anxiety., Disp: 90 tablet, Rfl: 3   Galcanezumab-gnlm (EMGALITY) 120 MG/ML SOAJ, INJECT 120 MG INTO THE SKIN EVERY 30 (THIRTY) DAYS., Disp: 3 mL, Rfl: 3   propranolol (INDERAL) 20 MG tablet, TAKE 1 TABLET BY MOUTH EVERY DAY MUST MAKE APPT FOR REFILLS, Disp: 90 tablet, Rfl: 3   spironolactone (ALDACTONE) 50 MG tablet, Take 50 mg by mouth in the morning and at bedtime., Disp: , Rfl:    topiramate (TOPAMAX) 50 MG tablet, Take 1 tablet (50 mg total) by mouth daily., Disp: 90 tablet, Rfl: 3   Ubrogepant (UBRELVY) 100 MG TABS, Take 100 mg by mouth daily as needed. Take one tablet at onset of headache, may repeat 1 tablet in 2 hours, no more than 2 tablets in 24 hours, Disp: 10 tablet, Rfl: 11   Medications ordered in this encounter:  Meds ordered this encounter  Medications   molnupiravir EUA (LAGEVRIO) 200 mg CAPS capsule    Sig: Take 4 capsules (800 mg total) by mouth 2  (two) times daily for 5 days.    Dispense:  40 capsule    Refill:  0    Order Specific Question:   Supervising Provider    Answer:   MILLER, BRIAN [[id number]]   ondansetron (ZOFRAN) 4 MG tablet    Sig: Take 1 tablet (4 mg total) by mouth every 8 (eight) hours as needed for nausea or vomiting.    Dispense:  20 tablet    Refill:  0    Order Specific Question:   Supervising Provider    Answer:   Sabra Heck, BRIAN [[id number]]     *If you need refills on other medications prior to your next appointment, please contact your pharmacy*  Follow-Up: Call back or seek an in-person evaluation if the symptoms worsen or if the condition fails to improve as anticipated.  Other Instructions You are being prescribed MOLNUPIRAVIR for COVID-19 infection.   Please call the pharmacy or go through the drive through vs going inside if you are picking up the mediation yourself to prevent further spread. If prescribed to a [facility] affiliated pharmacy, a pharmacist will bring the medication out to your car.   ADMINISTRATION INSTRUCTIONS: Take with or without food. Swallow the tablets whole. Don't chew, crush, or break the medications because it might not work as well  For each dose of the medication, you should be taking FOUR tablets at  one time, TWICE a day   Finish your full five-day course of Molnupiravir even if you feel better before you're done. Stopping this medication too early can make it less effective to prevent severe illness related to [place].    Molnupiravir is prescribed for YOU ONLY. Don't share it with others, even if they have similar symptoms as you. This medication might not be right for everyone.   Make sure to take steps to protect yourself and others while you're taking this medication in order to get well soon and to prevent others from getting sick with COVID-19.   **If you are of childbearing potential (any gender) - it is advised to not get pregnant while taking this medication and  recommended that condoms are used for female partners the next 3 months after taking the medication out of extreme caution    COMMON SIDE EFFECTS: Diarrhea Nausea  Dizziness    If your COVID-19 symptoms get worse, get medical help right away. Call 911 if you experience symptoms such as worsening cough, trouble breathing, chest pain that doesn't go away, confusion, a hard time staying awake, and pale or blue-colored skin. This medication won't prevent all COVID-19 cases from getting worse.    10 Things You Can Do to Manage Your COVID-19 Symptoms at Home If you have possible or confirmed COVID-19 Stay home except to get medical care. Monitor your symptoms carefully. If your symptoms get worse, call your healthcare provider immediately. Get rest and stay hydrated. If you have a medical appointment, call the healthcare provider ahead of time and tell them that you have or may have COVID-19. For medical emergencies, call 911 and notify the dispatch personnel that you have or may have COVID-19. Cover your cough and sneezes with a tissue or use the inside of your elbow. Wash your hands often with soap and water for at least 20 seconds or clean your hands with an alcohol-based hand sanitizer that contains at least 60% alcohol. As much as possible, stay in a specific room and away from other people in your home. Also, you should use a separate bathroom, if available. If you need to be around other people in or outside of the home, wear a mask. Avoid sharing personal items with other people in your household, like dishes, towels, and bedding. Clean all surfaces that are touched often, like counters, tabletops, and doorknobs. Use household cleaning sprays or wipes according to the label instructions. [email or web address] 05/02/2020 This information is not intended to replace advice given to you by your health care provider. Make sure you discuss any questions you have with your health care  provider. Document Revised: 06/26/2021 Document Reviewed: 06/26/2021 Elsevier Patient Education  2022 [employer].    If you have been instructed to have an in-person evaluation today at a local Urgent Care facility, please use the link below. It will take you to a list of all of our available [facility], including address, phone number and hours of operation. Please do not delay care.  [facility]  If you or a family member do not have a primary care provider, use the link below to schedule a visit and establish care. When you choose a [facility] primary care physician or advanced practice provider, you gain a long-term partner in health. Find a Primary Care Provider  Learn more about [facility] in-office and virtual care options: [facility]

## 2021-09-21 NOTE — Addendum Note (Signed)
Addended by: Carvel Getting on: 09/21/2021 02:29 PM   Modules accepted: Orders

## 2021-09-21 NOTE — Progress Notes (Signed)
Virtual Visit Consent   [name], you are scheduled for a virtual visit with a [facility] provider today.     Just as with appointments in the office, your consent must be obtained to participate.  Your consent will be active for this visit and any virtual visit you may have with one of our providers in the next 365 days.     If you have a MyChart account, a copy of this consent can be sent to you electronically.  All virtual visits are billed to your insurance company just like a traditional visit in the office.    As this is a virtual visit, video technology does not allow for your provider to perform a traditional examination.  This may limit your provider's ability to fully assess your condition.  If your provider identifies any concerns that need to be evaluated in person or the need to arrange testing (such as labs, EKG, etc.), we will make arrangements to do so.     Although advances in technology are sophisticated, we cannot ensure that it will always work on either your end or our end.  If the connection with a video visit is poor, the visit may have to be switched to a telephone visit.  With either a video or telephone visit, we are not always able to ensure that we have a secure connection.     I need to obtain your verbal consent now.   Are you willing to proceed with your visit today?    Faith Jordan has provided verbal consent on 09/21/2021 for a virtual visit (video or telephone).   Faith Daring, PA-C   Date: 09/21/2021 11:44 AM   Virtual Visit via Video Note   I, Faith Jordan, connected with  Faith Jordan  ([id number], 03-Faith-1997) on 09/21/21 at 11:40 AM EST by a video-enabled telemedicine application and verified that I am speaking with the correct person using two identifiers.  Location: Patient: Virtual Visit Location Patient: Home Provider: Virtual Visit Location Provider: Home Office   I discussed the limitations of evaluation and  management by telemedicine and the availability of in person appointments. The patient expressed understanding and agreed to proceed.    History of Present Illness: Faith Jordan is a 25 y.o. who identifies as a female who was assigned female at birth, and is being seen today for Covid 63.  HPI: URI  This is a new problem. The current episode started in the past 7 days (tested positive for Covid today; symptoms started Saturday afternoon). The problem has been gradually worsening. There has been no fever. Associated symptoms include congestion, coughing (with food intake and talking a lot), headaches, nausea, a plugged ear sensation (yesterday), sinus pain and a sore throat. Pertinent negatives include no diarrhea, rhinorrhea or vomiting. Associated symptoms comments: Loss of appetite, dizziness. She has tried acetaminophen and decongestant (started tamiflu from UC) for the symptoms. The treatment provided no relief.   Was seen at [facility] yesterday and flu testing was negative. Had been given tamiflu as she had multiple positive flu exposures. She has since stopped this medication since testing positive for Covid. She did have a patient that was covid positive on Friday, her symptoms started Saturday afternoon.  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[place]) 04/22/2015   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   Neurocardiogenic syncope  04/06/2013   Intermittent palpitations 04/06/2013    Allergies:  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, blistering peeling skin   Nsaids Dermatitis    Reaction similar to Stephens-Johnson, blistering peeling rash   Sulfa Antibiotics Swelling    Facial swelling   Medications:  Current Outpatient Medications:    molnupiravir EUA (LAGEVRIO) 200 mg CAPS capsule, Take 4 capsules (800 mg total) by mouth 2 (two) times daily for 5 days., Disp: 40 capsule, Rfl: 0   ondansetron (ZOFRAN) 4 MG tablet, Take 1 tablet (4 mg total) by mouth every 8 (eight) hours as needed for nausea or vomiting., Disp: 20 tablet, Rfl: 0   buPROPion (WELLBUTRIN XL) 150 MG 24 hr tablet, Take 150 mg by mouth daily., Disp: , Rfl:    EPINEPHrine 0.3 mg/0.3 mL IJ SOAJ injection, SMARTSIG:0.3 Milligram(s) IM Once PRN, Disp: , Rfl:    escitalopram (LEXAPRO) 20 MG tablet, Take 1 tablet (20 mg total) by mouth daily. For anxiety., Disp: 90 tablet, Rfl: 3   Galcanezumab-gnlm (EMGALITY) 120 MG/ML SOAJ, INJECT 120 MG INTO THE SKIN EVERY 30 (THIRTY) DAYS., Disp: 3 mL, Rfl: 3   propranolol (INDERAL) 20 MG tablet, TAKE 1 TABLET BY MOUTH EVERY DAY MUST MAKE APPT FOR REFILLS, Disp: 90 tablet, Rfl: 3   spironolactone (ALDACTONE) 50 MG tablet, Take 50 mg by mouth in the morning and at bedtime., Disp: , Rfl:    topiramate (TOPAMAX) 50 MG tablet, Take 1 tablet (50 mg total) by mouth daily., Disp: 90 tablet, Rfl: 3   Ubrogepant (UBRELVY) 100 MG TABS, Take 100 mg by mouth daily as needed. Take one tablet at onset of headache, may repeat 1 tablet in 2 hours, no more than 2 tablets in 24 hours, Disp: 10 tablet, Rfl: 11  Observations/Objective: Patient is well-developed, well-nourished in no acute distress.  Appears ill Resting comfortably at home.  Head is normocephalic, atraumatic.  No labored breathing.  Speech is clear and coherent with logical content.  Patient is alert and  oriented at baseline.    Assessment and Plan: 1. COVID-19 - molnupiravir EUA (LAGEVRIO) 200 mg CAPS capsule; Take 4 capsules (800 mg total) by mouth 2 (two) times daily for 5 days.  Dispense: 40 capsule; Refill: 0 - ondansetron (ZOFRAN) 4 MG tablet; Take 1 tablet (4 mg total) by mouth every 8 (eight) hours as needed for nausea or vomiting.  Dispense: 20 tablet; Refill: 0 - MyChart COVID-19 home monitoring program; Future  - Continue OTC symptomatic management of choice - Will send OTC vitamins and supplement information through AVS - Molnupiravir and zofran prescribed - Patient enrolled in MyChart symptom monitoring - Push fluids - Rest as needed - Discussed return precautions and when to seek in-person evaluation, sent via AVS as well   Follow Up Instructions: I discussed the assessment and treatment plan with the patient. The patient was provided an opportunity to ask questions and all were answered. The patient agreed with the plan and demonstrated an understanding of the instructions.  A  copy of instructions were sent to the patient via MyChart unless otherwise noted below.    The patient was advised to call back or seek an in-person evaluation if the symptoms worsen or if the condition fails to improve as anticipated.  Time:  I spent 12 minutes with the patient via telehealth technology discussing the above problems/concerns.    Faith Daring, PA-C

## 2021-09-21 NOTE — Telephone Encounter (Signed)
Spoke to patient by telephone and was advised that she went to [facility] yesterday . Patient stated that they did a flu test but did not do a coivd test since she had gotten a negative result at home.  Patient stated now she is positive for covid. Advised patient that we would need to do at least a virtual visit with a provider in order to prescribe medication. Patient stated that she is frustrated because she has tried to contact the urgent care that she went to yesterday and can not get anyone to answer the phone. Patient was advised that we do not have any appointments available today and Allie Bossier NP is not in the office today. Patient stated that she does not feel that she should have to pay for another visit since she went to [facility] yesterday. Patient was advised how to do a mychart e-visit with [facility]. Patient stated that she will plan on doing that today.

## 2021-09-30 NOTE — Telephone Encounter (Signed)
Joellen, will you add her to 12/15 at 12:20 pm? I placed a hold on the spot.

## 2021-09-30 NOTE — Telephone Encounter (Signed)
Patient added to schedule as advised.

## 2021-10-01 ENCOUNTER — Encounter: Payer: Self-pay | Admitting: Primary Care

## 2021-10-01 ENCOUNTER — Other Ambulatory Visit: Payer: Self-pay

## 2021-10-01 ENCOUNTER — Ambulatory Visit (INDEPENDENT_AMBULATORY_CARE_PROVIDER_SITE_OTHER): Payer: BC Managed Care – PPO | Admitting: Primary Care

## 2021-10-01 DIAGNOSIS — J069 Acute upper respiratory infection, unspecified: Secondary | ICD-10-CM | POA: Insufficient documentation

## 2021-10-01 LAB — POC COVID19 BINAXNOW: SARS Coronavirus 2 Ag: NEGATIVE

## 2021-10-01 LAB — POCT INFLUENZA A/B
Influenza A, POC: NEGATIVE
Influenza B, POC: NEGATIVE

## 2021-10-01 MED ORDER — HYDROCOD POLST-CPM POLST ER 10-8 MG/5ML PO SUER
5.0000 mL | Freq: Two times a day (BID) | ORAL | 0 refills | Status: DC | PRN
Start: 2021-10-01 — End: 2022-03-17

## 2021-10-01 NOTE — Assessment & Plan Note (Signed)
Symptoms, presentation, and HPI representative of viral illness. Exam with clear lungs and not suggestive of pneumonia.   Rapid Covid negative. Rapid Flu negative.  Discussed symptomatic care including Flonase, Mucinex, Tylenol/Ibuprofen.   Rx for Tussionex provided to use HS PRN. Drowsiness precautions provided. She will update in about 1 week if no improvement.

## 2021-10-01 NOTE — Progress Notes (Signed)
Subjective:    Patient ID: Faith Jordan, female    DOB: 12-16-1995, 25 y.o.   MRN: [id number]  HPI  Faith Jordan is a very pleasant 25 y.o. female with a history of migraines, ANS, intermittent palpitations, neurocardiogenic syncope, Covid-19 infection who presents today to discuss post Covid-19 symptoms.   Symptom onset 09/19/21 with nausea, sore throat. She then progressed with dizziness, fatigue, decreased appetite. She was treated with Tamiflu at the time because she thought she may have flu. She then tested positive for Covid-19 on 09/21/21, stopped Tamiflu, treated with molnupiravir on 09/21/21.   She began feeling better with nearly all symptoms resolved and  was cleared for work on 09/24/21. She has been doing fine until two days ago. She tested negative for Covid-19 one week ago.   Two days ago she woke up with a scratchy throat, rhinorrhea, dry cough, now cough with mucous production, ear pain with fullness, sinus pressure.  She's not sure if she has pneumonia or rebound Covid-19 symptoms or something else. She works as an Games developer at [employer].    Review of Systems  Constitutional:  Positive for fatigue. Negative for fever.  HENT:  Positive for congestion and ear pain.        Scratchy throat, ear fullness  Respiratory:  Positive for cough.   Cardiovascular:  Negative for chest pain.  Gastrointestinal:  Negative for nausea.  Neurological:  Positive for dizziness.        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   Angio-edema 04/27/2016   Angioedema    Aquagenic angio-edema-urticaria    Asthma    no problems /not used recently   Dysautonomia ([place])    Dysrhythmia    sinus tachycardia   Fibroid    right breast, adenoma   Headache(784.0)    Hives 12/22/2015   Pneumonia    hx  6th grade   Vasculitis ([facility])     Social History   Socioeconomic History   Marital status: Single    Spouse name: Not on file    Number of children: Not on file   Years of education: Not on file   Highest education level: Not on file  Occupational History   Not on file  Tobacco Use   Smoking status: Never   Smokeless tobacco: Never  Vaping Use   Vaping Use: Never used  Substance and Sexual Activity   Alcohol use: No   Drug use: No   Sexual activity: Not on file  Other Topics Concern   Not on file  Social History Narrative   Student at [employer], [place].    Enjoys reading.   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    Social Determinants of Health   Financial Resource Strain: Not on file  Food Insecurity: Not on file  Transportation Needs: Not on file  Physical Activity: Not on file  Stress: Not on file  Social Connections: Not on file  Intimate Partner Violence: Not on file    Past Surgical History:  Procedure Laterality Date   ADENOIDECTOMY     BREAST LUMPECTOMY Right    BREAST LUMPECTOMY Right    MASS EXCISION Right 10/07/2014   Procedure: EXCISION OF RIGHT BREAST MASS;  Surgeon: Jackolyn Confer, MD;  Location: [facility];  Service: General;  Laterality: Right;   TONSILLECTOMY AND ADENOIDECTOMY     TOOTH EXTRACTION      Family History  Problem Relation Age of Onset   Breast cancer Maternal Grandmother 53   Myasthenia gravis Maternal Grandfather    Migraines Mother    Narcolepsy Mother    Hypothyroidism Mother    Asthma Other    Cancer Other    Epilepsy Other    Allergic rhinitis Neg Hx    Angioedema Neg Hx    Atopy Neg Hx    Eczema Neg Hx    Immunodeficiency Neg Hx    Urticaria Neg Hx     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, blistering peeling skin   Nsaids Dermatitis    Reaction similar to Stephens-Johnson, blistering peeling rash   Sulfa Antibiotics Swelling    Facial swelling    Current Outpatient Medications on File Prior to Visit  Medication Sig Dispense Refill   buPROPion (WELLBUTRIN XL) 150 MG 24  hr tablet Take 150 mg by mouth daily.     EPINEPHrine 0.3 mg/0.3 mL IJ SOAJ injection SMARTSIG:0.3 Milligram(s) IM Once PRN     escitalopram (LEXAPRO) 20 MG tablet Take 1 tablet (20 mg total) by mouth daily. For anxiety. 90 tablet 3   Galcanezumab-gnlm (EMGALITY) 120 MG/ML SOAJ INJECT 120 MG INTO THE SKIN EVERY 30 (THIRTY) DAYS. 3 mL 3   ondansetron (ZOFRAN) 4 MG tablet Take 1 tablet (4 mg total) by mouth every 8 (eight) hours as needed for nausea or vomiting. 20 tablet 0   ondansetron (ZOFRAN) 4 MG tablet Take 1 tablet (4 mg total) by mouth every 8 (eight) hours as needed for nausea or vomiting. 20 tablet 0   propranolol (INDERAL) 20 MG tablet TAKE 1 TABLET BY MOUTH EVERY DAY MUST MAKE APPT FOR REFILLS 90 tablet 3   spironolactone (ALDACTONE) 50 MG tablet Take 50 mg by mouth in the morning and at bedtime.     topiramate (TOPAMAX) 50 MG tablet Take 1 tablet (50 mg total) by mouth daily. 90 tablet 3   Ubrogepant (UBRELVY) 100 MG TABS Take 100 mg by mouth daily as needed. Take one tablet at onset of headache, may repeat 1 tablet in 2 hours, no more than 2 tablets in 24 hours 10 tablet 11   No current facility-administered medications on file prior to visit.    BP 108/62    Pulse 68    Temp 98 F (36.7 C) (Temporal)    Ht 5\' 7"  (1.702 m)    Wt 160 lb (72.6 kg)    SpO2 97%    BMI 25.06 kg/m  Objective:   Physical Exam Constitutional:      Appearance: She is ill-appearing.  HENT:     Right Ear: Tympanic membrane normal.     Left Ear: Tympanic membrane and ear canal normal.     Ears:     Comments: Mild fluid noted behind right TM    Nose:     Right Sinus: No maxillary sinus tenderness or frontal sinus tenderness.     Left Sinus: No maxillary sinus tenderness or frontal sinus tenderness.     Mouth/Throat:     Pharynx: No posterior oropharyngeal erythema.  Eyes:     Conjunctiva/sclera: Conjunctivae normal.  Neck:     Comments: Mild left sided cervical lymphadenopathy  Cardiovascular:      Rate and Rhythm: Normal rate and regular rhythm.  Pulmonary:     Effort: Pulmonary effort is normal.     Breath sounds: Normal breath sounds. No wheezing or rales.  Lymphadenopathy:     Cervical: Cervical adenopathy present.  Skin:    General: Skin is warm and dry.          Assessment & Plan:      This visit occurred during the SARS-CoV-2 public health emergency.  Safety protocols were in place, including screening questions prior to the visit, additional usage of staff PPE, and extensive cleaning of exam room while observing appropriate contact time as indicated for disinfecting solutions.

## 2021-10-01 NOTE — Patient Instructions (Addendum)
You can try a few things over the counter to help with your symptoms including:  Cough: Delsym or Robitussin (get the off brand, works just as well) Chest Congestion: Mucinex (plain) Nasal Congestion/Ear Pressure/Sinus Pressure: Try using Flonase (fluticasone) nasal spray. Instill 1 spray in each nostril twice daily. This can be purchased over the counter. Body aches, fevers, headache: Ibuprofen (not to exceed 2400 mg in 24 hours) or Acetaminophen-Tylenol (not to exceed 3000 mg in 24 hours) Runny Nose/Throat Drainage/Sneezing/Itchy or Watery Eyes: An antihistamine such as Zyrtec, Claritin, Xyzal, Allegra  You should be feeling better by day seven of symptoms, but please do contact me if this is not the case.  You may take the cough suppressant every 12 hours as needed for cough and rest. Caution this medication contains codeine which may cause drowsiness.   Therapy options:  -[facility]    [facility]    [phone]  [facility]    [phone]  - [facility]    [phone]  -[place]    [phone]  -[place]    [phone]

## 2021-10-02 ENCOUNTER — Ambulatory Visit: Payer: BC Managed Care – PPO | Admitting: Primary Care

## 2021-10-02 MED ORDER — PREDNISONE 20 MG PO TABS: ORAL_TABLET | ORAL | 0 refills | Status: DC

## 2021-10-07 ENCOUNTER — Ambulatory Visit: Payer: BC Managed Care – PPO | Admitting: Primary Care

## 2021-10-14 ENCOUNTER — Ambulatory Visit: Payer: BC Managed Care – PPO | Admitting: Psychology

## 2021-10-21 ENCOUNTER — Encounter: Payer: Self-pay | Admitting: Family Medicine

## 2021-10-21 ENCOUNTER — Other Ambulatory Visit: Payer: Self-pay

## 2021-10-21 MED ORDER — UBRELVY 100 MG PO TABS: ORAL_TABLET | ORAL | 10 refills | Status: DC

## 2021-10-21 MED ORDER — LEVOCETIRIZINE DIHYDROCHLORIDE 5 MG PO TABS: ORAL_TABLET | ORAL | 2 refills | Status: DC

## 2021-10-21 MED ORDER — ONDANSETRON HCL 4 MG PO TABS: ORAL_TABLET | ORAL | 0 refills | Status: DC

## 2021-10-21 MED ORDER — ESCITALOPRAM OXALATE 10 MG PO TABS: ORAL_TABLET | ORAL | 0 refills | Status: DC

## 2021-10-21 MED ORDER — BUPROPION HCL ER (XL) 150 MG PO TB24
ORAL_TABLET | ORAL | 1 refills | Status: DC
  Filled 2021-11-28: qty 90, 90d supply, fill #0

## 2021-10-21 MED ORDER — SPIRONOLACTONE 50 MG PO TABS: ORAL_TABLET | ORAL | 0 refills | Status: DC

## 2021-10-21 MED ORDER — TOPIRAMATE 50 MG PO TABS
ORAL_TABLET | ORAL | 1 refills | Status: DC
  Filled 2021-11-28: qty 90, 90d supply, fill #0

## 2021-10-21 MED ORDER — EMGALITY 120 MG/ML ~~LOC~~ SOAJ
SUBCUTANEOUS | 9 refills | Status: DC
  Filled 2021-10-21 – 2021-10-28 (×5): qty 1, 30d supply, fill #0
  Filled 2021-12-14: qty 1, 30d supply, fill #1

## 2021-10-21 MED ORDER — PROPRANOLOL HCL 20 MG PO TABS
ORAL_TABLET | ORAL | 2 refills | Status: DC
  Filled 2021-11-28: qty 90, 90d supply, fill #0

## 2021-10-21 MED ORDER — ESCITALOPRAM OXALATE 20 MG PO TABS
ORAL_TABLET | ORAL | 2 refills | Status: DC
  Filled 2021-11-28: qty 90, 90d supply, fill #0

## 2021-10-21 NOTE — Telephone Encounter (Signed)
Sample:1 box emaglity 120mg  Lot: [id number], exp: 05/10/2023

## 2021-10-23 ENCOUNTER — Other Ambulatory Visit: Payer: Self-pay

## 2021-10-26 ENCOUNTER — Telehealth: Payer: Self-pay | Admitting: Neurology

## 2021-10-26 ENCOUNTER — Other Ambulatory Visit: Payer: Self-pay

## 2021-10-26 NOTE — Telephone Encounter (Signed)
PA completed on CMM/medimpact [facility] Will await determination

## 2021-10-27 ENCOUNTER — Other Ambulatory Visit: Payer: Self-pay

## 2021-10-28 ENCOUNTER — Other Ambulatory Visit: Payer: Self-pay

## 2021-10-28 NOTE — Telephone Encounter (Signed)
Submitted PA Emgality on CMM. Key: [id number]. Received instant approval: "The request has been approved. The authorization is effective for a maximum of 12 fills from 10/28/2021 to 10/27/2022, as long as the member is enrolled in their current health plan. The request was approved with a quantity restriction. This has been approved for a quantity limit of 1 with a day supply limit of 30. A written notification letter will follow with additional details."

## 2021-10-29 ENCOUNTER — Other Ambulatory Visit: Payer: Self-pay

## 2021-10-29 NOTE — Telephone Encounter (Signed)
Approved-request has been approved. The authorization is effective from 10/27/2021 to 11/26/2021, as long as the member is enrolled in their current health plan. The request was approved as submitted. This request is approved for two 120mg /mL syringes per 30 days. An additional prior authorization has been entered for Emgality 120mg /mL for 1 syringe per 30 days effective 11/19/2021 through 04/17/2022

## 2021-11-16 ENCOUNTER — Other Ambulatory Visit: Payer: Self-pay

## 2021-11-16 MED ORDER — PRAZOSIN HCL 1 MG PO CAPS
ORAL_CAPSULE | ORAL | 0 refills | Status: DC
  Filled 2021-11-16: qty 45, 30d supply, fill #0

## 2021-11-24 ENCOUNTER — Other Ambulatory Visit: Payer: Self-pay

## 2021-11-24 MED ORDER — CEPHALEXIN 500 MG PO CAPS
ORAL_CAPSULE | ORAL | 0 refills | Status: DC
  Filled 2021-11-24: qty 21, 7d supply, fill #0

## 2021-11-30 ENCOUNTER — Other Ambulatory Visit: Payer: Self-pay

## 2021-12-02 ENCOUNTER — Other Ambulatory Visit: Payer: Self-pay

## 2021-12-02 MED ORDER — PRAZOSIN HCL 1 MG PO CAPS
ORAL_CAPSULE | ORAL | 0 refills | Status: DC
  Filled 2021-12-02: qty 60, 30d supply, fill #0

## 2021-12-02 MED ORDER — TRAZODONE HCL 50 MG PO TABS
ORAL_TABLET | ORAL | 0 refills | Status: DC
  Filled 2021-12-02: qty 30, 30d supply, fill #0

## 2021-12-14 ENCOUNTER — Other Ambulatory Visit: Payer: Self-pay

## 2021-12-15 LAB — HM PAP SMEAR

## 2022-01-04 DIAGNOSIS — F411 Generalized anxiety disorder: Secondary | ICD-10-CM | POA: Diagnosis not present

## 2022-01-05 ENCOUNTER — Other Ambulatory Visit: Payer: Self-pay

## 2022-01-05 MED ORDER — DULOXETINE HCL 20 MG PO CPEP
ORAL_CAPSULE | ORAL | 0 refills | Status: DC
  Filled 2022-01-05: qty 7, 7d supply, fill #0

## 2022-01-11 ENCOUNTER — Other Ambulatory Visit: Payer: Self-pay

## 2022-01-12 DIAGNOSIS — F411 Generalized anxiety disorder: Secondary | ICD-10-CM | POA: Diagnosis not present

## 2022-01-21 ENCOUNTER — Other Ambulatory Visit: Payer: Self-pay

## 2022-01-22 DIAGNOSIS — N6310 Unspecified lump in the right breast, unspecified quadrant: Secondary | ICD-10-CM | POA: Diagnosis not present

## 2022-01-23 DIAGNOSIS — F411 Generalized anxiety disorder: Secondary | ICD-10-CM | POA: Diagnosis not present

## 2022-02-03 ENCOUNTER — Ambulatory Visit: Payer: BC Managed Care – PPO | Admitting: Primary Care

## 2022-02-03 DIAGNOSIS — F411 Generalized anxiety disorder: Secondary | ICD-10-CM | POA: Diagnosis not present

## 2022-02-03 DIAGNOSIS — Z1321 Encounter for screening for nutritional disorder: Secondary | ICD-10-CM | POA: Diagnosis not present

## 2022-02-03 DIAGNOSIS — Z130 Encounter for screening for diseases of the blood and blood-forming organs and certain disorders involving the immune mechanism: Secondary | ICD-10-CM | POA: Diagnosis not present

## 2022-02-03 DIAGNOSIS — Z1329 Encounter for screening for other suspected endocrine disorder: Secondary | ICD-10-CM | POA: Diagnosis not present

## 2022-02-03 DIAGNOSIS — Z13228 Encounter for screening for other metabolic disorders: Secondary | ICD-10-CM | POA: Diagnosis not present

## 2022-02-03 DIAGNOSIS — Z131 Encounter for screening for diabetes mellitus: Secondary | ICD-10-CM | POA: Diagnosis not present

## 2022-02-09 ENCOUNTER — Encounter: Payer: Self-pay | Admitting: Family Medicine

## 2022-02-11 ENCOUNTER — Ambulatory Visit (INDEPENDENT_AMBULATORY_CARE_PROVIDER_SITE_OTHER): Payer: BC Managed Care – PPO | Admitting: Family Medicine

## 2022-02-11 ENCOUNTER — Encounter: Payer: Self-pay | Admitting: Family Medicine

## 2022-02-11 VITALS — BP 110/66 | HR 91 | Ht 67.0 in | Wt 161.7 lb

## 2022-02-11 DIAGNOSIS — F411 Generalized anxiety disorder: Secondary | ICD-10-CM

## 2022-02-11 DIAGNOSIS — R5383 Other fatigue: Secondary | ICD-10-CM

## 2022-02-11 DIAGNOSIS — R0683 Snoring: Secondary | ICD-10-CM | POA: Insufficient documentation

## 2022-02-11 DIAGNOSIS — F410 Panic disorder [episodic paroxysmal anxiety] without agoraphobia: Secondary | ICD-10-CM

## 2022-02-11 HISTORY — DX: Snoring: R06.83

## 2022-02-11 MED ORDER — HYDROXYZINE HCL 10 MG PO TABS: 10.0000 mg | ORAL_TABLET | Freq: Three times a day (TID) | ORAL | 0 refills | Status: DC | PRN

## 2022-02-11 MED ORDER — DULOXETINE HCL 60 MG PO CPEP: 60.0000 mg | ORAL_CAPSULE | Freq: Every day | ORAL | 3 refills | Status: DC

## 2022-02-11 NOTE — Assessment & Plan Note (Signed)
Chronic, likely multifactorial ? ?She has requested evaluation for sleep apnea through neurology with Dr. Brett Fairy.  She states recent lab work-up has been thorough and normal. ?Her mood is likely contributing to her fatigue. ?

## 2022-02-11 NOTE — Assessment & Plan Note (Signed)
Chronic, poor control ? ?She is having trouble getting in contact with her online psychiatrist.  She is also unhappy that she has not been given a rescue medication for panic attacks.  We spent a significant portion of time talking about risks of benzodiazepines.   ?Recent worsening of anxiety since the change from Lexapro to Cymbalta may be due to the fact that she is not on the typical maintenance dose of Cymbalta yet.  We will have her increase to 60 mg daily.   ?I will prescribe hydroxyzine 10 mg p.o. 3 times daily as needed to use for panic attacks if needed. ?I placed a referral for psychiatry to see if we can get her someone in person.  If she would like to discuss temporary benzodiazepine use further she will make a follow-up appointment with her PCP who has a long-term relationship with her or she can discuss it with the in person psychiatrist. ?

## 2022-02-11 NOTE — Patient Instructions (Addendum)
Increase 60 mg Cymbalta. ? Do a trial of hydroxyzine.. call if it not helping. ? Continue counseling. ? Referral are in process for sleep study and the psychiatry. ?

## 2022-02-11 NOTE — Assessment & Plan Note (Addendum)
Acute, poorly controlled ? ?This is a new symptom for her.  She has never had panic attacks before.  There are are no clear secondary causes and per the patient she recently had lab evaluation including CBC thyroid and vitamin levels. ?States this does not feel like it is associated with her past issue with dysautonomia. ?

## 2022-02-11 NOTE — Assessment & Plan Note (Signed)
Chronic, inadequate control ? ?She had childhood sleep apnea that was treated with tonsillectomy and adenoidectomy.  At that time she was also told she had a palate issue.  She is concerned that current sleep apnea may be affecting her daytime energy.  Referral to neurology for sleep testing was made.  Dr. Brett Fairy. ?

## 2022-02-11 NOTE — Progress Notes (Signed)
? ? Patient ID: Faith Jordan, female    DOB: April 27, 1996, 26 y.o.   MRN: [id number] ? ?This visit was conducted in person. ? ?BP 110/66   Pulse 91   Ht '5\' 7"'$  (1.702 m)   Wt 161 lb 11.2 oz (73.3 kg)   SpO2 98%   BMI 25.33 kg/m?   ? ?CC: ?Chief Complaint  ?Patient presents with  ? Anxiety  ?  Pt has 3 panic attacks over the weekend,  pt just started taking cymbalta  she isn't sure it that is the cause of  it , not really feel her self.  ? ? ?Subjective:  ? ?HPI: ?Faith Jordan is a 26 y.o. female presenting on 02/11/2022 for Anxiety (Pt has 3 panic attacks over the weekend,  pt just started taking cymbalta  she isn't sure it that is the cause of  it , not really feel her self.) ? ? GAD: She has bee having issues for year. Mood had been worsened with IUD but this is out now. Progesterone only pill.. stopped. ?She had been on lexapro and wellbutrin for several months tho a year. ? She weaned down on leaxapro.. increased up on cymbalta to 30 mg daily. ( Has been on this in the last 3 weeks) ? Stopped the wellbutrin. ? ? She is doing Furniture conservator/restorer and psychiatry. She has not been able to reach them. ? ? Now in last 3 days she has had new panic attacks. ? She has been irritable, anxious. ?She is using trazodone for sleep.. this helps. ? No trigger. ? ?   ? ?Relevant past medical, surgical, family and social history reviewed and updated as indicated. Interim medical history since our last visit reviewed. ?Allergies and medications reviewed and updated. ?Outpatient Medications Prior to Visit  ?Medication Sig Dispense Refill  ? Galcanezumab-gnlm (EMGALITY) 120 MG/ML SOAJ INJECT 120 MG INTO THE SKIN EVERY 30 (THIRTY) DAYS. 3 mL 3  ? Galcanezumab-gnlm (EMGALITY) 120 MG/ML SOAJ Inject 120 mg into the skin every 30 days 1 mL 9  ? propranolol (INDERAL) 20 MG tablet TAKE 1 TABLET BY MOUTH EVERY DAY MUST MAKE APPT FOR REFILLS 90 tablet 3  ? Ubrogepant (UBRELVY) 100 MG TABS Take 100 mg by mouth daily as needed. Take one  tablet at onset of headache, may repeat 1 tablet in 2 hours, no more than 2 tablets in 24 hours 10 tablet 11  ? Ubrogepant (UBRELVY) 100 MG TABS Take as directed 10 tablet 10  ? buPROPion (WELLBUTRIN XL) 150 MG 24 hr tablet Take 150 mg by mouth daily. (Patient not taking: Reported on 02/11/2022)    ? buPROPion (WELLBUTRIN XL) 150 MG 24 hr tablet Take 1 tablet by mouth once daily. (Patient not taking: Reported on 02/11/2022) 90 tablet 1  ? cephALEXin (KEFLEX) 500 MG capsule Take 1 capsule by mouth three times daily for 7 days (Patient not taking: Reported on 02/11/2022) 21 capsule 0  ? chlorpheniramine-HYDROcodone (TUSSIONEX PENNKINETIC ER) 10-8 MG/5ML SUER Take 5 mLs by mouth every 12 (twelve) hours as needed for cough. 50 mL 0  ? DULoxetine (CYMBALTA) 20 MG capsule take 1 capsule everyday x 7 days (Patient not taking: Reported on 02/11/2022) 7 capsule 0  ? EPINEPHrine 0.3 mg/0.3 mL IJ SOAJ injection SMARTSIG:0.3 Milligram(s) IM Once PRN (Patient not taking: Reported on 02/11/2022)    ? escitalopram (LEXAPRO) 10 MG tablet Take 1 & 1/2 tablets by mouth daily for anxiety (Patient not taking: Reported on 02/11/2022) 270 tablet  0  ? escitalopram (LEXAPRO) 20 MG tablet Take 1 tablet (20 mg total) by mouth daily. For anxiety. (Patient not taking: Reported on 02/11/2022) 90 tablet 3  ? escitalopram (LEXAPRO) 20 MG tablet Take 1 tablet by mouth daily for anxiety (Patient not taking: Reported on 02/11/2022) 90 tablet 2  ? levocetirizine (XYZAL) 5 MG tablet Take 1 tablet by mouth once daily in the evening (Patient not taking: Reported on 02/11/2022) 90 tablet 2  ? ondansetron (ZOFRAN) 4 MG tablet Take 1 tablet (4 mg total) by mouth every 8 (eight) hours as needed for nausea or vomiting. (Patient not taking: Reported on 02/11/2022) 20 tablet 0  ? ondansetron (ZOFRAN) 4 MG tablet Take 1 tablet (4 mg total) by mouth every 8 (eight) hours as needed for nausea or vomiting. (Patient not taking: Reported on 02/11/2022) 20 tablet 0  ?  ondansetron (ZOFRAN) 4 MG tablet Take 1 tablet by mouth every 8 hours as needed for nausea and vomiting (Patient not taking: Reported on 02/11/2022) 20 tablet 0  ? prazosin (MINIPRESS) 1 MG capsule take 2 tabs at night (Patient not taking: Reported on 02/11/2022) 60 capsule 0  ? spironolactone (ALDACTONE) 50 MG tablet Take 1 tablet by mouth once daily (Patient not taking: Reported on 02/11/2022) 30 tablet 0  ? topiramate (TOPAMAX) 50 MG tablet Take 1 tablet (50 mg total) by mouth daily. 90 tablet 3  ? topiramate (TOPAMAX) 50 MG tablet Take 1 tablet by mouth once daily 90 tablet 1  ? traZODone (DESYREL) 50 MG tablet take 1 tab at bedtime as needed 30 tablet 0  ? predniSONE (DELTASONE) 20 MG tablet Take 2 tablets by mouth once daily for 5 days. 40 tablet 0  ? propranolol (INDERAL) 20 MG tablet Take 1 tablet by mouth every day 90 tablet 2  ? spironolactone (ALDACTONE) 50 MG tablet Take 50 mg by mouth in the morning and at bedtime.    ? ?No facility-administered medications prior to visit.  ?  ? ?Per HPI unless specifically indicated in ROS section below ?Review of Systems  ?Constitutional:  Negative for chills and fever.  ?HENT:  Negative for congestion and ear pain.   ?Eyes:  Negative for pain and redness.  ?Respiratory:  Negative for cough and shortness of breath.   ?Cardiovascular:  Negative for chest pain, palpitations and leg swelling.  ?Gastrointestinal:  Negative for abdominal pain, blood in stool, constipation, diarrhea, nausea and vomiting.  ?Genitourinary:  Negative for dysuria.  ?Musculoskeletal:  Negative for myalgias.  ?Skin:  Negative for rash.  ?Neurological:  Negative for dizziness.  ?Psychiatric/Behavioral:  Positive for agitation, decreased concentration and dysphoric mood. Negative for self-injury and suicidal ideas. The patient is not nervous/anxious.   ?Objective:  ?BP 110/66   Pulse 91   Ht '5\' 7"'$  (1.702 m)   Wt 161 lb 11.2 oz (73.3 kg)   SpO2 98%   BMI 25.33 kg/m?   ?Wt Readings from Last 3  Encounters:  ?02/11/22 161 lb 11.2 oz (73.3 kg)  ?10/01/21 160 lb (72.6 kg)  ?07/30/21 156 lb (70.8 kg)  ?  ?  ?Physical Exam ?Constitutional:   ?   General: She is not in acute distress. ?   Appearance: Normal appearance. She is well-developed. She is not ill-appearing or toxic-appearing.  ?HENT:  ?   Head: Normocephalic.  ?   Right Ear: Hearing, tympanic membrane, ear canal and external ear normal. Tympanic membrane is not erythematous, retracted or bulging.  ?   Left Ear:  Hearing, tympanic membrane, ear canal and external ear normal. Tympanic membrane is not erythematous, retracted or bulging.  ?   Nose: No mucosal edema or rhinorrhea.  ?   Right Sinus: No maxillary sinus tenderness or frontal sinus tenderness.  ?   Left Sinus: No maxillary sinus tenderness or frontal sinus tenderness.  ?   Mouth/Throat:  ?   Pharynx: Uvula midline.  ?Eyes:  ?   General: Lids are normal. Lids are everted, no foreign bodies appreciated.  ?   Conjunctiva/sclera: Conjunctivae normal.  ?   Pupils: Pupils are equal, round, and reactive to light.  ?Neck:  ?   Thyroid: No thyroid mass or thyromegaly.  ?   Vascular: No carotid bruit.  ?   Trachea: Trachea normal.  ?Cardiovascular:  ?   Rate and Rhythm: Normal rate and regular rhythm.  ?   Pulses: Normal pulses.  ?   Heart sounds: Normal heart sounds, S1 normal and S2 normal. No murmur heard. ?  No friction rub. No gallop.  ?Pulmonary:  ?   Effort: Pulmonary effort is normal. No tachypnea or respiratory distress.  ?   Breath sounds: Normal breath sounds. No decreased breath sounds, wheezing, rhonchi or rales.  ?Abdominal:  ?   General: Bowel sounds are normal.  ?   Palpations: Abdomen is soft.  ?   Tenderness: There is no abdominal tenderness.  ?Musculoskeletal:  ?   Cervical back: Normal range of motion and neck supple.  ?Skin: ?   General: Skin is warm and dry.  ?   Findings: No rash.  ?Neurological:  ?   Mental Status: She is alert and oriented to person, place, and time.  ?   GCS: GCS  eye subscore is 4. GCS verbal subscore is 5. GCS motor subscore is 6.  ?   Cranial Nerves: No cranial nerve deficit.  ?   Sensory: No sensory deficit.  ?   Motor: No abnormal muscle tone.  ?   Coordination: Coordination nor

## 2022-02-12 ENCOUNTER — Encounter: Payer: Self-pay | Admitting: *Deleted

## 2022-02-12 NOTE — Progress Notes (Signed)
Patient notified by MyChart to schedule follow up with K. Clark in 1 to 2 weeks to follow up Anxiety per Dr. Diona Browner.  See MyChart response. ?

## 2022-02-16 DIAGNOSIS — F411 Generalized anxiety disorder: Secondary | ICD-10-CM | POA: Diagnosis not present

## 2022-02-21 ENCOUNTER — Encounter: Payer: Self-pay | Admitting: Family Medicine

## 2022-02-22 ENCOUNTER — Other Ambulatory Visit: Payer: Self-pay | Admitting: *Deleted

## 2022-02-22 MED ORDER — EMGALITY 120 MG/ML ~~LOC~~ SOAJ: SUBCUTANEOUS | 0 refills | Status: DC

## 2022-02-23 NOTE — Telephone Encounter (Signed)
Submitted PA on CMM. Key: [id number]. Waiting on determination from [place]. ?

## 2022-02-26 ENCOUNTER — Encounter (INDEPENDENT_AMBULATORY_CARE_PROVIDER_SITE_OTHER): Payer: BC Managed Care – PPO

## 2022-02-26 DIAGNOSIS — F419 Anxiety disorder, unspecified: Secondary | ICD-10-CM

## 2022-02-26 NOTE — Telephone Encounter (Signed)
Please see the MyChart message reply(ies) for my assessment and plan.  The patient gave consent for this Medical Advice Message and is aware that it may result in a bill to their insurance company as well as the possibility that this may result in a co-payment or deductible. They are an established patient, but are not seeking medical advice exclusively about a problem treated during an in person or video visit in the last 7 days. I did not recommend an in person or video visit within 7 days of my reply.  I spent a total of 15 minutes cumulative time within 7 days through MyChart messaging Tametha Banning K Philip Kotlyar, NP  

## 2022-03-01 ENCOUNTER — Other Ambulatory Visit: Payer: Self-pay | Admitting: *Deleted

## 2022-03-01 DIAGNOSIS — G43709 Chronic migraine without aura, not intractable, without status migrainosus: Secondary | ICD-10-CM

## 2022-03-01 MED ORDER — AJOVY 225 MG/1.5ML ~~LOC~~ SOAJ: 1.5000 mL | SUBCUTANEOUS | 1 refills | Status: DC

## 2022-03-01 NOTE — Telephone Encounter (Signed)
Submitted PA on CMM. Key: [id number]. Waiting on determination from [employer].  ?

## 2022-03-02 NOTE — Telephone Encounter (Signed)
PA [place] submitted on CMM. Key: [id number]. Waiting on determination from [employer]. ?

## 2022-03-05 ENCOUNTER — Other Ambulatory Visit: Payer: Self-pay | Admitting: Family Medicine

## 2022-03-15 ENCOUNTER — Encounter: Payer: Self-pay | Admitting: Family Medicine

## 2022-03-17 ENCOUNTER — Encounter: Payer: Self-pay | Admitting: Primary Care

## 2022-03-17 ENCOUNTER — Ambulatory Visit (INDEPENDENT_AMBULATORY_CARE_PROVIDER_SITE_OTHER): Payer: BC Managed Care – PPO | Admitting: Primary Care

## 2022-03-17 VITALS — BP 110/62 | HR 72 | Temp 98.3°F | Ht 67.0 in | Wt 159.0 lb

## 2022-03-17 DIAGNOSIS — R0683 Snoring: Secondary | ICD-10-CM

## 2022-03-17 DIAGNOSIS — F410 Panic disorder [episodic paroxysmal anxiety] without agoraphobia: Secondary | ICD-10-CM

## 2022-03-17 DIAGNOSIS — R5383 Other fatigue: Secondary | ICD-10-CM | POA: Diagnosis not present

## 2022-03-17 DIAGNOSIS — G479 Sleep disorder, unspecified: Secondary | ICD-10-CM | POA: Insufficient documentation

## 2022-03-17 DIAGNOSIS — F411 Generalized anxiety disorder: Secondary | ICD-10-CM | POA: Diagnosis not present

## 2022-03-17 MED ORDER — GABAPENTIN 100 MG PO CAPS: 100.0000 mg | ORAL_CAPSULE | Freq: Every day | ORAL | 0 refills | Status: DC

## 2022-03-17 NOTE — Assessment & Plan Note (Signed)
Improved with dose adjustment of Cymbalta to 60 mg.  Continue Cymbalta 60 mg daily.  She has failed multiple prescription options for treatment of anxiety, therefore we will proceed with psychiatry evaluation. New referral placed to psychiatry and therapy.  Long discussion regarding the use of benzodiazepines as treatment for her symptoms. We decided not to proceed.  We decided to initiate gabapentin 100 mg-200 mg at bedtime for chronic anxiety treatment.  She will hold trazodone at night when starting gabapentin.  I instructed her to notify me in 2 weeks if she has not heard from the therapy or psychiatry referral.   We did discuss intermittent FMLA for panic attacks, she will think about this and update if she would like to proceed.

## 2022-03-17 NOTE — Assessment & Plan Note (Signed)
Improved with dose adjustment of Cymbalta to 60 mg.  Continue Cymbalta 60 mg daily.  She has failed multiple prescription options for treatment of anxiety, therefore we will proceed with psychiatry evaluation. New referral placed to psychiatry and therapy.  Long discussion regarding the use of benzodiazepines as treatment for her symptoms. We decided not to proceed.  We decided to initiate gabapentin 100 mg-200 mg at bedtime for chronic anxiety treatment.  She will hold trazodone at night when starting gabapentin.  I instructed her to notify me in 2 weeks if she has not heard from the therapy or psychiatry referral.

## 2022-03-17 NOTE — Progress Notes (Signed)
Subjective:    Patient ID: Faith Jordan, female    DOB: 01/10/1996, 26 y.o.   MRN: [id number]  HPI  Faith Jordan is a very pleasant 26 y.o. female with a history of migraines, dysautonomia, neurocardiogenic syncope, anemia, chronic back pain, GAD, panic attacks who presents today to discuss anxiety and sleep disturbance.  Her mother joins [place] today.  Chronic history of anxiety dating back to childhood. Currently managed on duloxetine 60 mg daily, propranolol 20 mg daily (for sinus tachycardia), Trazodone 50 mg HS, hydroxyzine 10 mg PRN.  Over the last few months her anxiety has increased. Symptoms include over thinking things, worrying, chest tightness, nausea, shortness of breath, palpitations, "uncontrolled crying". Evaluated by Dr. Diona Browner in late April 2023 who increased her dose of duloxetine from 30 mg to 60 mg.   She contacted [place] via MyChart a few weeks ago reporting increased panic attacks which have caused significant physical effects, more so than mental effects.  She will get caught in a cycle of inability to control her physical symptoms of panic attacks, has had to leave work.  Since her dose increase of Cymbalta to 60 mg she's noticed improvement in her mental symptoms, reduction in the frequency of panic attacks.   Currently following with psychiatry and therapy, [facility]. She doesn't feel that her psychiatrist is working with her to control her symptoms. She was referred to [facility] in late April 2023, has not heard anything from this referral.  She continues to meet with her therapist 1-2 times monthly, has good rapport, but visits are cost prohibitive.  Previously tried Buspar for anxiety which made symptoms worse. Her mother is asking for a prescription for Xanax as this has helped historically. A bottle of 10 tablets lasts for 2 years. This was originally prescribed by her pediatrician.   Her mother mentions that she has a history of sleep apnea as a baby, had to have  tonsils and adenoids removed due to this. She is currently managed on Trazodone for sleep which is effecitve, but if she doesn't take Trazodone she will wake multiple times during the night. She has a personal history of sleep walking as a child. She has a significant family history of narcolepsy and other sleep disorders in her mother and her mother's side of the family. She's not undergone sleep study.  She does follow with neurology for her chronic migraines.  She was told that she needed a referral for evaluation of sleep disorder   Review of Systems  Constitutional:  Positive for fatigue.  Respiratory:  Positive for chest tightness and shortness of breath.   Cardiovascular:  Positive for palpitations.       Physical symptoms of anxiety  Psychiatric/Behavioral:  Positive for sleep disturbance. Negative for suicidal ideas. The patient is nervous/anxious.         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   Angio-edema 04/27/2016   Angioedema    Aquagenic angio-edema-urticaria    Asthma    no problems /not used recently   Dysautonomia ([place])    Dysrhythmia    sinus tachycardia   Fibroid    right breast, adenoma   Headache(784.0)    Hives 12/22/2015   Pneumonia    hx  6th grade   Vasculitis ([facility])     Social History   Socioeconomic History   Marital status: Single    Spouse name: Not on file   Number of  children: Not on file   Years of education: Not on file   Highest education level: Not on file  Occupational History   Not on file  Tobacco Use   Smoking status: Never   Smokeless tobacco: Never  Vaping Use   Vaping Use: Never used  Substance and Sexual Activity   Alcohol use: No   Drug use: No   Sexual activity: Not on file  Other Topics Concern   Not on file  Social History Narrative   Student at [employer], [place].    Enjoys reading.   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    Social Determinants of Health    Financial Resource Strain: Not on file  Food Insecurity: Not on file  Transportation Needs: Not on file  Physical Activity: Not on file  Stress: Not on file  Social Connections: Not on file  Intimate Partner Violence: Not on file    Past Surgical History:  Procedure Laterality Date   ADENOIDECTOMY     BREAST LUMPECTOMY Right    BREAST LUMPECTOMY Right    MASS EXCISION Right 10/07/2014   Procedure: EXCISION OF RIGHT BREAST MASS;  Surgeon: Jackolyn Confer, MD;  Location: [facility];  Service: General;  Laterality: Right;   TONSILLECTOMY AND ADENOIDECTOMY     TOOTH EXTRACTION      Family History  Problem Relation Age of Onset   Breast cancer Maternal Grandmother 78   Myasthenia gravis Maternal Grandfather    Migraines Mother    Narcolepsy Mother    Hypothyroidism Mother    Asthma Other    Cancer Other    Epilepsy Other    Allergic rhinitis Neg Hx    Angioedema Neg Hx    Atopy Neg Hx    Eczema Neg Hx    Immunodeficiency Neg Hx    Urticaria Neg Hx     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, blistering peeling skin   Nsaids Dermatitis    Reaction similar to Stephens-Johnson, blistering peeling rash   Sulfa Antibiotics Swelling    Facial swelling    Current Outpatient Medications on File Prior to Visit  Medication Sig Dispense Refill   DULoxetine (CYMBALTA) 60 MG capsule TAKE 1 CAPSULE BY MOUTH EVERY DAY 90 capsule 0   Fremanezumab-vfrm (AJOVY) 225 MG/1.5ML SOAJ Inject 1.5 mLs into the skin every 30 (thirty) days. 4.5 mL 1   hydrOXYzine (ATARAX) 10 MG tablet Take 1 tablet (10 mg total) by mouth 3 (three) times daily as needed for anxiety. 30 tablet 0   NIKKI 3-0.02 MG tablet Take 1 tablet by mouth daily.     propranolol (INDERAL) 20 MG tablet TAKE 1 TABLET BY MOUTH EVERY DAY MUST MAKE APPT FOR REFILLS 90 tablet 3   topiramate (TOPAMAX) 50 MG tablet Take 1 tablet by mouth once daily 90 tablet 1   traZODone  (DESYREL) 50 MG tablet take 1 tab at bedtime as needed 30 tablet 0   Ubrogepant (UBRELVY) 100 MG TABS Take as directed 10 tablet 10   No current facility-administered medications on file prior to visit.    BP 110/62   Pulse 72   Temp 98.3 F (36.8 C) (Oral)   Ht '5\' 7"'$  (1.702 m)   Wt 159 lb (72.1 kg)   SpO2 100%   BMI 24.90 kg/m  Objective:   Physical Exam Cardiovascular:     Rate and Rhythm: Normal rate and regular rhythm.  Pulmonary:     Effort: Pulmonary effort is normal.     Breath sounds: Normal breath sounds.  Musculoskeletal:     Cervical back: Neck supple.  Skin:    General: Skin is warm and dry.  Psychiatric:        Mood and Affect: Mood normal.     Comments: Tearful during visit.          Assessment & Plan:

## 2022-03-17 NOTE — Patient Instructions (Signed)
You will be contacted regarding your referral to neurology, therapy, psychiatry.  Please let us know if you have not been contacted within two weeks.   Start gabapentin 100 mg at bedtime for anxiety.  Okay to increase to 200 mg after 1 week if no side effects.  Please schedule a follow up visit for 1 month.  It was a pleasure to see you today!

## 2022-03-17 NOTE — Assessment & Plan Note (Signed)
Referral placed to her neurology practice for evaluation of sleep disorder.

## 2022-03-25 NOTE — Telephone Encounter (Signed)
Do you want me to call and add patient for virtual?

## 2022-03-25 NOTE — Telephone Encounter (Signed)
Yes, please have her scheduled virtually for late June/early July

## 2022-03-29 ENCOUNTER — Telehealth: Payer: Self-pay

## 2022-03-29 DIAGNOSIS — F411 Generalized anxiety disorder: Secondary | ICD-10-CM | POA: Diagnosis not present

## 2022-03-29 NOTE — Telephone Encounter (Signed)
Patent has appointment no further action needed.

## 2022-03-29 NOTE — Telephone Encounter (Signed)
Pt called to check on status of referral. Pt states she has not heard back and has had no missed call. Confirmed number on file. Per pt please leave VM if she is unable to pick up call as she is working.

## 2022-04-05 ENCOUNTER — Other Ambulatory Visit: Payer: Self-pay

## 2022-04-05 ENCOUNTER — Other Ambulatory Visit: Payer: Self-pay | Admitting: Cardiovascular Disease

## 2022-04-05 MED ORDER — HYDROXYZINE HCL 10 MG PO TABS: 10.0000 mg | ORAL_TABLET | Freq: Three times a day (TID) | ORAL | 0 refills | Status: DC | PRN

## 2022-04-05 NOTE — Addendum Note (Signed)
Addended by: Waunita Schooner R on: 04/05/2022 10:16 AM   Modules accepted: Orders

## 2022-04-15 ENCOUNTER — Ambulatory Visit: Payer: BC Managed Care – PPO | Admitting: Primary Care

## 2022-04-21 DIAGNOSIS — F411 Generalized anxiety disorder: Secondary | ICD-10-CM | POA: Diagnosis not present

## 2022-05-04 ENCOUNTER — Other Ambulatory Visit: Payer: Self-pay | Admitting: Primary Care

## 2022-05-04 DIAGNOSIS — F411 Generalized anxiety disorder: Secondary | ICD-10-CM

## 2022-05-10 ENCOUNTER — Telehealth: Payer: Self-pay | Admitting: *Deleted

## 2022-05-10 NOTE — Telephone Encounter (Signed)
Submitted PA Ubrelvy on [facility]. Key: [id number]. Waiting on determination from [employer] Part D.

## 2022-05-10 NOTE — Telephone Encounter (Signed)
Request Reference Number: [id number]. UBRELVY TAB '100MG'$  is approved through 08/10/2022. Your patient may now fill this prescription and it will be covered.

## 2022-05-14 DIAGNOSIS — M7541 Impingement syndrome of right shoulder: Secondary | ICD-10-CM | POA: Diagnosis not present

## 2022-05-20 ENCOUNTER — Telehealth (INDEPENDENT_AMBULATORY_CARE_PROVIDER_SITE_OTHER): Payer: BC Managed Care – PPO | Admitting: Primary Care

## 2022-05-20 ENCOUNTER — Encounter: Payer: Self-pay | Admitting: Primary Care

## 2022-05-20 ENCOUNTER — Ambulatory Visit (INDEPENDENT_AMBULATORY_CARE_PROVIDER_SITE_OTHER): Payer: BC Managed Care – PPO | Admitting: Psychology

## 2022-05-20 VITALS — Ht 67.0 in | Wt 150.0 lb

## 2022-05-20 DIAGNOSIS — G479 Sleep disorder, unspecified: Secondary | ICD-10-CM

## 2022-05-20 DIAGNOSIS — F411 Generalized anxiety disorder: Secondary | ICD-10-CM

## 2022-05-20 MED ORDER — TRAZODONE HCL 50 MG PO TABS: 50.0000 mg | ORAL_TABLET | Freq: Every day | ORAL | 0 refills | Status: DC

## 2022-05-20 NOTE — Patient Instructions (Signed)
  Follow-up with psychiatry and therapy as scheduled.  I sent refills of your trazodone to your pharmacy.  It was a pleasure to see you today!

## 2022-05-20 NOTE — Progress Notes (Signed)
C   Patient ID: Faith Jordan, female    DOB: Jul 26, 1996, 26 y.o.   MRN: [id number]  Virtual visit completed through [place], a video enabled telemedicine application. Due to national recommendations of social distancing due to COVID-19, a virtual visit is felt to be most appropriate for this patient at this time. Reviewed limitations, risks, security and privacy concerns of performing a virtual visit and the availability of in person appointments. I also reviewed that there may be a patient responsible charge related to this service. The patient agreed to proceed.   Patient location: home Provider location: [place] at [facility], office Persons participating in this virtual visit: patient, provider   If any vitals were documented, they were collected by patient at home unless specified below.    Ht '5\' 7"'$  (1.702 m)   Wt 150 lb (68 kg)   BMI 23.49 kg/m    CC: Follow up for Anxiety Subjective:   HPI: Faith Jordan is a 26 y.o. female with a history of chronic anxiety, sinus tachycardia, ANS, dysautonomia, neurocardiogenic syncope, panic attacks presenting on 05/20/2022 for Anxiety  She was last evaluated on 03/17/22 for evaluation of ongoing anxiety and sleep disturbance. During this visit her Cymbalta had just been increased to 60 mg so we continued this dose. We also referred her to psychiatry given numerous failed medication attempts for treatment. Gabapentin 100-200 mg HS to use for sleep and anxiety. She was also referred to therapy.  Since her last visit she's feeling better overall. Her mood has improved an she's feeling less irritable. She's had one visit with her new psychologist. She thinks this visit went well. She will begin meeting weekly.   She has an appointment scheduled at the end of August 2023 with psychiatry. She's been taking gabapentin 100 mg HS along with Trazodone 50 mg HS. She denies SI/HI.      Relevant past medical, surgical, family and social history  reviewed and updated as indicated. Interim medical history since our last visit reviewed. Allergies and medications reviewed and updated. Outpatient Medications Prior to Visit  Medication Sig Dispense Refill   DULoxetine (CYMBALTA) 60 MG capsule TAKE 1 CAPSULE BY MOUTH EVERY DAY 90 capsule 0   Fremanezumab-vfrm (AJOVY) 225 MG/1.5ML SOAJ Inject 1.5 mLs into the skin every 30 (thirty) days. 4.5 mL 1   gabapentin (NEURONTIN) 100 MG capsule TAKE 1-2 CAPSULES (100-200 MG TOTAL) BY MOUTH AT BEDTIME. FOR ANXIETY. 60 capsule 0   hydrOXYzine (ATARAX) 10 MG tablet Take 1 tablet (10 mg total) by mouth 3 (three) times daily as needed for anxiety. 30 tablet 0   NIKKI 3-0.02 MG tablet Take 1 tablet by mouth daily.     propranolol (INDERAL) 20 MG tablet TAKE 1 TABLET BY MOUTH EVERY DAY 90 tablet 3   topiramate (TOPAMAX) 50 MG tablet Take 1 tablet by mouth once daily 90 tablet 1   Ubrogepant (UBRELVY) 100 MG TABS Take as directed 10 tablet 10   traZODone (DESYREL) 50 MG tablet take 1 tab at bedtime as needed 30 tablet 0   No facility-administered medications prior to visit.     Per HPI unless specifically indicated in ROS section below Review of Systems  Psychiatric/Behavioral:  Negative for sleep disturbance and suicidal ideas. The patient is nervous/anxious.        See HPI   Objective:  Ht '5\' 7"'$  (1.702 m)   Wt 150 lb (68 kg)   BMI 23.49 kg/m   Wt Readings from Last  3 Encounters:  05/20/22 150 lb (68 kg)  03/17/22 159 lb (72.1 kg)  02/11/22 161 lb 11.2 oz (73.3 kg)       Physical exam: General: Alert and oriented x 3, no distress, does not appear sickly  Pulmonary: Speaks in complete sentences without increased work of breathing, no cough during visit.  Psychiatric: Improved mood, smiling today.     Results for orders placed or performed in visit on 10/01/21  Influenza A/B  Result Value Ref Range   Influenza A, POC Negative Negative   Influenza B, POC Negative Negative  POC COVID-19   Result Value Ref Range   SARS Coronavirus 2 Ag Negative Negative   Assessment & Plan:   Problem List Items Addressed This Visit       Other   GAD (generalized anxiety disorder) - Primary   Relevant Medications   traZODone (DESYREL) 50 MG tablet   Sleep disturbance   Relevant Medications   traZODone (DESYREL) 50 MG tablet     Meds ordered this encounter  Medications   traZODone (DESYREL) 50 MG tablet    Sig: Take 1 tablet (50 mg total) by mouth at bedtime. For sleep    Dispense:  90 tablet    Refill:  0    Order Specific Question:   Supervising Provider    Answer:   BEDSOLE, AMY E [[id number]]   No orders of the defined types were placed in this encounter.   I discussed the assessment and treatment plan with the patient. The patient was provided an opportunity to ask questions and all were answered. The patient agreed with the plan and demonstrated an understanding of the instructions. The patient was advised to call back or seek an in-person evaluation if the symptoms worsen or if the condition fails to improve as anticipated.  Follow up plan:  Follow-up with psychiatry and therapy as scheduled.  I sent refills of your trazodone to your pharmacy.  It was a pleasure to see you today!   Pleas Koch, NP

## 2022-05-20 NOTE — Assessment & Plan Note (Signed)
Improving!  Continue Cymbalta 60 mg daily, gabapentin 100 mg at bedtime, trazodone 50 mg at bedtime. Follow-up with psychiatry and therapy as scheduled.
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
## 2022-05-26 ENCOUNTER — Ambulatory Visit (INDEPENDENT_AMBULATORY_CARE_PROVIDER_SITE_OTHER): Payer: BC Managed Care – PPO | Admitting: Neurology

## 2022-05-26 ENCOUNTER — Encounter: Payer: Self-pay | Admitting: Neurology

## 2022-05-26 ENCOUNTER — Ambulatory Visit (INDEPENDENT_AMBULATORY_CARE_PROVIDER_SITE_OTHER): Payer: BC Managed Care – PPO | Admitting: Psychology

## 2022-05-26 VITALS — BP 128/83 | HR 83 | Ht 67.0 in | Wt 157.0 lb

## 2022-05-26 DIAGNOSIS — M2619 Other specified anomalies of jaw-cranial base relationship: Secondary | ICD-10-CM

## 2022-05-26 DIAGNOSIS — R6889 Other general symptoms and signs: Secondary | ICD-10-CM

## 2022-05-26 DIAGNOSIS — G478 Other sleep disorders: Secondary | ICD-10-CM | POA: Diagnosis not present

## 2022-05-26 DIAGNOSIS — R0683 Snoring: Secondary | ICD-10-CM | POA: Diagnosis not present

## 2022-05-26 DIAGNOSIS — F411 Generalized anxiety disorder: Secondary | ICD-10-CM | POA: Diagnosis not present

## 2022-05-26 DIAGNOSIS — G9331 Postviral fatigue syndrome: Secondary | ICD-10-CM

## 2022-05-26 DIAGNOSIS — G4719 Other hypersomnia: Secondary | ICD-10-CM | POA: Diagnosis not present

## 2022-05-26 DIAGNOSIS — G4753 Recurrent isolated sleep paralysis: Secondary | ICD-10-CM

## 2022-05-26 NOTE — Progress Notes (Signed)
SLEEP MEDICINE CLINIC    Provider:  Larey Seat, MD  Primary Care Physician:  Pleas Koch, NP [place] [id number]     Referring Provider: Jinny Sanders, Md [address] [place],  [place] [address]     Primary Neurologist is dr Sarina Ill , MD      Chief Complaint according to patient   Patient presents with:     New Patient (Initial Visit)     05-26-2022: alone. Pt referred for sleep consult due to excessive fatigue, even after 10hrs of sleep, and snoring. No prior SS. Pt has hx of sleep apnea when younger that was resolved after having her tonsils and adenoids removed. Family hx of sleep disorders.       HISTORY OF PRESENT ILLNESS:  Faith Jordan is a 26 y.o. Caucasian female patient is  seen here upon PCP referral on 05/26/2022  for an unspecified sleep disorder.  Chief concern according to patient :  " The patient states that she often feels very sleepy in the later afternoon and she does not stay up very long during the day due to her feeling of urge to go to sleep.  When she was a toddler may be in kindergarten she underwent tonsillectomy and adenoidectomy after being diagnosed with sleep apnea.  She has not been told that she is snoring again.  There is also history of initial problems to go to sleep for which trazodone had been prescribed which seems to have worked.  Further mentioned her history of anxiety and panic disorder.  The patient was referred to psychiatry and online therapy. reports that even after prolonged sleep time she will not wake up refreshed or restored. 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  Her mother was treated here for night terrors, loud screaming, has parasomnia,    Faith Jordan  has a past medical history of Acne, Acquired breast deformity (12/22/2015), Angio-edema (04/27/2016), Aquagenic angio-edema-urticaria, Asthma, Dysautonomia ([place]), Dysrhythmia, Fibroid adenoma of the breast, chronic migraines  on ajovy  (784.0), Hives (12/22/2015), Pneumonia, and Vasculitis ([place]).   Sleep relevant medical history: Nocturia 2 times,  infant sleep apnea, childhood Sleep walking,  Night terrors or panic attacks,, Tonsillectomy, retrognathia.    Family medical /sleep history: MGM on CPAP with OSA, mother with night terror, sister with  insomnia, sleep walking in her brother .    Social history:  Patient is working as Therapist, sports- daytime and lives in a household with fiance ,used to work in shifts( Presenter, broadcasting,) Pets are present. One dog and 2 cats.  Tobacco use/ none.  ETOH use / rarely,  Caffeine intake in form of Coffee( 3-5 a week) no energy drinks. Regular exercise in form of walking the dogs. .       Sleep habits are as follows: Lifelong sleepiness. The patient's dinner time is between 5-6.30 PM. The patient goes to bed at 8.30-9 PM and continues to sleep for 8-10 hours, wakes for 2 bathroom breaks, the first time at 2 AM.   The preferred sleep position is laterally or supine with the support of 2 pillows.and on body pillow  Dreams are reportedly frequent/vivid.  The patient wakes up with an alarm.  5.45 AM is the usual rise time.   She reports not feeling refreshed or restored in AM, with symptoms such as dry mouth- always sleep with mouth open, and residual fatigue.  Naps are taken frequently, she feels  excessively sleepy- naps lasting from 1-2  hours and do not  affect her nocturnal sleep.    Review of Systems: Out of a complete 14 system review, the patient complains of only the following symptoms, and all other reviewed systems are negative.:  Fatigue, sleepiness , snoring, fragmented sleep. Can go to sleep on meds but has trouble to go back after waking up.    How likely are you to doze in the following situations: 0 = not likely, 1 = slight chance, 2 = moderate chance, 3 = high chance   Sitting and Reading? Watching Television? Sitting inactive in a public place (theater or meeting)? As a passenger  in a car for an hour without a break? Lying down in the afternoon when circumstances permit? Sitting and talking to someone? Sitting quietly after lunch without alcohol? In a car, while stopped for a few minutes in traffic?   Total = 12-14/ 24 points   FSS endorsed at 51/ 63 points.   Anxiety, depression, panic.   Migraine reduced under Ajovy, weather and menstrual cycle are triggers.   Social History   Socioeconomic History   Marital status: Significant Other    Spouse name: Not on file   Number of children: Not on file   Years of education: Not on file   Highest education level: Bachelor's degree (e.g., BA, AB, BS)  Occupational History   Not on file  Tobacco Use   Smoking status: Never   Smokeless tobacco: Never  Vaping Use   Vaping Use: Never used  Substance and Sexual Activity   Alcohol use: No   Drug use: No   Sexual activity: Not on file  Other Topics Concern   Not on file  Social History Narrative   Lives with fiance   R handed   Caffeine: 1 drink a day   Social Determinants of Health   Financial Resource Strain: Not on file  Food Insecurity: Not on file  Transportation Needs: Not on file  Physical Activity: Not on file  Stress: Not on file  Social Connections: Not on file    Family History  Problem Relation Age of Onset   Breast cancer Maternal Grandmother 30   Myasthenia gravis Maternal Grandfather    Migraines Mother    Narcolepsy Mother    Hypothyroidism Mother    Asthma Other    Cancer Other    Epilepsy Other    Allergic rhinitis Neg Hx    Angioedema Neg Hx    Atopy Neg Hx    Eczema Neg Hx    Immunodeficiency Neg Hx    Urticaria Neg Hx     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   Angio-edema 04/27/2016   Angioedema    Aquagenic angio-edema-urticaria    Asthma    no problems /not used recently   Dysautonomia ([place])    Dysrhythmia    sinus tachycardia   Fibroid    right breast, adenoma    Chronic migraine on Ajovy (784.0)    Hives 12/22/2015   Pneumonia    hx  6th grade   Vasculitis ([place])     Past Surgical History:  Procedure Laterality Date   ADENOIDECTOMY     BREAST LUMPECTOMY Right    BREAST LUMPECTOMY Right    MASS EXCISION Right 10/07/2014   Procedure: EXCISION OF RIGHT BREAST MASS;  Surgeon: Jackolyn Confer, MD;  Location: [facility];  Service: General;  Laterality: Right;  TONSILLECTOMY AND ADENOIDECTOMY     TOOTH EXTRACTION       Current Outpatient Medications on File Prior to Visit  Medication Sig Dispense Refill   DULoxetine (CYMBALTA) 60 MG capsule TAKE 1 CAPSULE BY MOUTH EVERY DAY 90 capsule 0   Fremanezumab-vfrm (AJOVY) 225 MG/1.5ML SOAJ Inject 1.5 mLs into the skin every 30 (thirty) days. 4.5 mL 1   gabapentin (NEURONTIN) 100 MG capsule TAKE 1-2 CAPSULES (100-200 MG TOTAL) BY MOUTH AT BEDTIME. FOR ANXIETY. 60 capsule 0   hydrOXYzine (ATARAX) 10 MG tablet Take 1 tablet (10 mg total) by mouth 3 (three) times daily as needed for anxiety. 30 tablet 0   NIKKI 3-0.02 MG tablet Take 1 tablet by mouth daily.     propranolol (INDERAL) 20 MG tablet TAKE 1 TABLET BY MOUTH EVERY DAY 90 tablet 3   topiramate (TOPAMAX) 50 MG tablet Take 1 tablet by mouth once daily 90 tablet 1   traZODone (DESYREL) 50 MG tablet Take 1 tablet (50 mg total) by mouth at bedtime. For sleep 90 tablet 0   Ubrogepant (UBRELVY) 100 MG TABS Take as directed 10 tablet 10   No current facility-administered medications on file prior to visit.    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, blistering peeling skin   Nsaids Dermatitis    Reaction similar to Stephens-Johnson, blistering peeling rash   Sulfa Antibiotics Swelling    Facial swelling    Physical exam:  Today's Vitals   05/26/22 1249  BP: 128/83  Pulse: 83  Weight: 157 lb (71.2 kg)  Height: 5' 7"  (1.702 m)   Body mass index is 24.59 kg/m.   Wt Readings from Last 3  Encounters:  05/26/22 157 lb (71.2 kg)  05/20/22 150 lb (68 kg)  03/17/22 159 lb (72.1 kg)     Ht Readings from Last 3 Encounters:  05/26/22 5' 7"  (1.702 m)  05/20/22 5' 7"  (1.702 m)  03/17/22 5' 7"  (1.702 m)      General: The patient is awake, alert and appears not in acute distress. The patient is well groomed. Head: Normocephalic, atraumatic. Neck is supple.  Mallampati 1-2,  neck circumference:13 inches .  Nasal airflow patent.  Retrognathia is seen. Recessed jaw.  Dental status: biological  Cardiovascular:  Regular rate and cardiac rhythm by pulse,  without distended neck veins. Respiratory: Lungs are clear to auscultation.  Skin:  Without evidence of ankle edema, or rash. Trunk: The patient's posture is erect.   Neurologic exam : The patient is awake and alert, oriented to place and time.   Memory subjective described as intact.  Attention span & concentration ability appears normal.  Speech is fluent,  without  dysarthria, dysphonia or aphasia.  Mood and affect are appropriate.   Cranial nerves: no loss of smell or taste reported  Pupils are equal and briskly reactive to light. Funduscopic exam deferred. .  Extraocular movements in vertical and horizontal planes were intact and without nystagmus. No Diplopia. Visual fields by finger perimetry are intact. Hearing was intact to soft voice and finger rubbing.    Facial sensation intact to fine touch.  Facial motor strength is symmetric and tongue and uvula move midline.  Neck ROM : rotation, tilt and flexion extension were normal for age and shoulder shrug was symmetrical.    Motor exam:  Symmetric bulk, tone and ROM.   Normal tone without cog -wheeling, symmetric grip strength .   Sensory:  Fine touch/ vibration were normal.  Proprioception tested in the upper extremities was normal.   Coordination: Rapid alternating movements in the fingers/hands were of normal speed.  The Finger-to-nose maneuver was intact without  evidence of ataxia, dysmetria or tremor.   Gait and station: Patient could rise unassisted from a seated position, walked without assistive device.  Stance is of normal width/ base and the patient turned with 3 steps.  Toe and heel walk were deferred.  Deep tendon reflexes: in the  upper and lower extremities are symmetric and brisk. Babinski response was deferred.       After spending a total time of 35 minutes face to face and additional time for physical and neurologic examination, review of laboratory studies,  personal review of imaging studies, reports and results of other testing and review of referral information / records as far as provided in visit, I have established the following assessments:  Also Faith Jordan is referred today by her primary care physician Dr. Garry Heater but she is already followed in this office for chronic migraine.  She has been referred to psychiatry for anxiety and panic disorders.  She weaned off Lexapro in the past increased and switched to Cymbalta and has discontinued Wellbutrin.  She changed from [place] to Ajovy due to insurance restrictions.  Her current medication include Inderal or propranolol 20 mg, trazodone at bedtime as needed and 50 mg topiramate 50 mg daily, she had been on Zofran for nausea in April of this year but has not needed to take it on a more frequent basis, Xyzal, she is she is no longer on Lexapro.    1) Fatigue The patient explains that she has a desire to go to sleep no difficulties at this time to fall asleep but that she will have a bathroom break or to and sometimes has trouble to reinitiate sleep after results. Viivd dreams may wake her up, too.   2)EDS She may still get a total of 8 or 10 hours in of sleep without feeling refreshed or restored when she wakes up.    47 OSA-Her risk factor for obstructive sleep apnea is observed snoring in the presence of retrognathia which affects the tongue position at night especially if she  has chosen to sleep in supine.  4)There could also be an overlap between migraine and sleep disorders also she does not wake up with headaches she reports.   My Plan is to proceed with:  1) I would like for the patient to undergo a screening test to rule in or rule out the presence of sleep apnea, we can also see if snoring is positional dependent and if it needs to be treated.  she had her adenoids and tonsils removed as an infant.   I would like to thank Carlis Abbott Leticia Penna, NP and Jinny Sanders, [place],  [place] [address] for allowing me to meet with and to take care of this pleasant patient.   In short, Faith Jordan is presenting with EDS/ Fatigue. 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 Narcolepsy HLA panel.   I plan to follow up either personally or through our NP within 2-3 months.   CC: I will share my notes with PCP and Dr Jaynee Eagles, MD .  Electronically signed by: Larey Seat, MD 05/26/2022 1:05 PM  [facility] Board certified by The [employer] of  Sleep Medicine and Diplomate of the [employer] Academy of Sleep Medicine. Board certified In Neurology through the [employer], Fellow of the [employer] of Neurology. Medical Director of [employer].

## 2022-05-28 ENCOUNTER — Encounter: Payer: Self-pay | Admitting: Family Medicine

## 2022-05-28 ENCOUNTER — Telehealth: Payer: BC Managed Care – PPO | Admitting: Physician Assistant

## 2022-05-28 DIAGNOSIS — R3989 Other symptoms and signs involving the genitourinary system: Secondary | ICD-10-CM | POA: Diagnosis not present

## 2022-05-28 MED ORDER — NITROFURANTOIN MONOHYD MACRO 100 MG PO CAPS: 100.0000 mg | ORAL_CAPSULE | Freq: Two times a day (BID) | ORAL | 0 refills | Status: DC

## 2022-05-28 NOTE — Telephone Encounter (Signed)
Noted  

## 2022-05-28 NOTE — Progress Notes (Signed)
E-Visit for Urinary Problems  We are sorry that you are not feeling well.  Here is how we plan to help!  Based on what you shared with me it looks like you most likely have a simple urinary tract infection.  A UTI (Urinary Tract Infection) is a bacterial infection of the bladder.  Most cases of urinary tract infections are simple to treat but a key part of your care is to encourage you to drink plenty of fluids and watch your symptoms carefully.  I have prescribed MacroBid 100 mg twice a day for 5 days.  Your symptoms should gradually improve. Call us if the burning in your urine worsens, you develop worsening fever, back pain or pelvic pain or if your symptoms do not resolve after completing the antibiotic.  Urinary tract infections can be prevented by drinking plenty of water to keep your body hydrated.  Also be sure when you wipe, wipe from front to back and don't hold it in!  If possible, empty your bladder every 4 hours.  HOME CARE Drink plenty of fluids Compete the full course of the antibiotics even if the symptoms resolve Remember, when you need to go.go. Holding in your urine can increase the likelihood of getting a UTI! GET HELP RIGHT AWAY IF: You cannot urinate You get a high fever Worsening back pain occurs You see blood in your urine You feel sick to your stomach or throw up You feel like you are going to pass out  MAKE SURE YOU  Understand these instructions. Will watch your condition. Will get help right away if you are not doing well or get worse.   Thank you for choosing an e-visit.  Your e-visit answers were reviewed by a board certified advanced clinical practitioner to complete your personal care plan. Depending upon the condition, your plan could have included both over the counter or prescription medications.  Please review your pharmacy choice. Make sure the pharmacy is open so you can pick up prescription now. If there is a problem, you may contact your  provider through [employer] and have the prescription routed to another pharmacy.  Your safety is important to [place]. If you have drug allergies check your prescription carefully.   For the next 24 hours you can use MyChart to ask questions about today's visit, request a non-urgent call back, or ask for a work or school excuse. You will get an email in the next two days asking about your experience. I hope that your e-visit has been valuable and will speed your recovery.  I provided 5 minutes of non face-to-face time during this encounter for chart review and documentation.

## 2022-05-28 NOTE — Telephone Encounter (Signed)
I spoke with pt and she has already had a [facility] telehealth visit and pt was given abx. Nothing further needed at this time. Pt appreciative of call sending note to Gentry Fitz NP and Joellen CMA as Juluis Rainier.

## 2022-05-28 NOTE — Progress Notes (Signed)
[facility] Counselor/Therapist Progress Note  Patient ID: MAGALIE ALMON, MRN: [id number],    Date: 05/26/2022  Time Spent: 60 minutes  Treatment Type: Individual Therapy - The patient was seen via video visit.  She gave verbal consent for the session to be on video on web ex.  The patient was in her home alone and therapist was in the office.  Reported Symptoms: anxiety, panic attacks  Mental Status Exam: Appearance:  Casual     Behavior: Appropriate  Motor: Normal  Speech/Language:  Normal Rate  Affect: Blunt  Mood: anxious  Thought process: normal  Thought content:   WNL  Sensory/Perceptual disturbances:   WNL  Orientation: oriented to person, place, time/date, and situation  Attention: Good  Concentration: Good  Memory: WNL  Fund of knowledge:  Good  Insight:   Fair  Judgment:  Good  Impulse Control: Good   Risk Assessment: Danger to Self:  No Self-injurious Behavior: No Danger to Others: No Duty to Warn:no Physical Aggression / Violence:No  Access to Firearms a concern: No  Gang Involvement:No   Subjective: The patient was seen for an individual therapy session via video visit today.  The patient gave verbal permission for the session to be on video on web ex.  The patient was in her home alone and therapist was in the office.  We started talking today about the things that are causing the patient more anxiety and she feels that she wants to work on how to deal with her mother and father and her sister.  The patient reports that a lot of her anxiety is around her sister.  She reports that she has not had any anxiety attacks since we spoke last.  I began educating the patient on codependency and talked about anxiety.  I explained that anxiety is related to a threat and ability to cope with the threat.  The patient feels responsible for what happens with her mother and her sister.  And this is causing her a great deal of angst.  We will be working on  helping her understand codependency and how to detach from things that are out of her control.  Interventions: Cognitive Behavioral Therapy and Assertiveness/Communication,, problem solving, psychoeducation,  EMDR as indicated  Diagnosis:GAD (generalized anxiety disorder)  Plan: Client Abilities/Strengths  Intelligent, insightful, motivated  Client Treatment Preferences  Outpatient Individual therapy every other week  Client Statement of Needs  " I feel better now, but still need some help with anxiety"  Treatment Level  Outpatient Individual therapy  Symptoms  Frustration and anxiety related to providing oversight and caretaking to an aging, ailing, and dependent  parent.: No Description Entered (Status: improved). Hypervigilance (e.g., feeling constantly on edge,  experiencing concentration difficulties, having trouble falling or staying asleep, exhibiting a general  state of irritability).: No Description Entered (Status: improved). Motor tension (e.g., restlessness,  tiredness, shakiness, muscle tension).: No Description Entered (Status: improved).  Problems Addressed  Anxiety, Phase Of Life Problems, Anxiety  Goals 1. Learn and implement coping skills that result in a reduction of anxiety  and worry, and improved daily functioning. Objective Learn and implement calming skills to reduce overall anxiety and manage anxiety symptoms. Target Date: 2024-08-09Frequency: Weekly Progress: 0 Modality: individual  Related Interventions 1. Teach the client calming/relaxation skills (e.g., applied relaxation, progressive muscle  relaxation, cue controlled relaxation; mindful breathing; biofeedback) and how to discriminate  better between relaxation and tension; teach the client how to apply these skills to his/her daily  life (e.g., New Directions in Progressive Muscle Relaxation by Casper Harrison, and  Hazlett-Stevens; Treating Generalized Anxiety Disorder by Rygh and  Amparo Bristol). Objective Identify, challenge, and replace biased, fearful self-talk with positive, realistic, and empowering selftalk. Target Date: 2023-05-27 Frequency: weekly Progress: 0 Modality: individual Related Interventions 1. Explore the client's schema and self-talk that mediate his/her fear response; assist him/her in  challenging the biases; replace the distorted messages with reality-based alternatives and  positive, realistic self-talk that will increase his/her self-confidence in coping with irrational  fears (see Cognitive Therapy of Anxiety Disorders by Alison Stalling). Objective Learn and implement problem-solving strategies for realistically addressing worries. Target Date: 2024-08-09Frequency: weekly Progress: 0 Modality: individual 2. Resolve conflicted feelings and adapt to the new life circumstances. Objective Apply problem-solving skills to current circumstances. Target Date: 2023-05-27 Frequency: weekly Progress: 0 Modality: individual Related Interventions 1. Teach the client problem-resolution skills (e.g., defining the problem clearly, brainstorming  multiple solutions, listing the pros and cons of each solution, seeking input from others,  selecting and implementing a plan of action, evaluating outcome, and readjusting plan as  necessary).   3. Stabilize anxiety level while increasing ability to function on a daily  basis. 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  The patient approved this plan.   Keiana Tavella G Etienne Millward, LCSW                  Jayke Caul G Laiana Fratus, LCSW

## 2022-05-30 ENCOUNTER — Ambulatory Visit: Payer: BC Managed Care – PPO

## 2022-05-31 ENCOUNTER — Other Ambulatory Visit: Payer: Self-pay | Admitting: Neurology

## 2022-05-31 ENCOUNTER — Encounter: Payer: Self-pay | Admitting: Emergency Medicine

## 2022-05-31 ENCOUNTER — Ambulatory Visit
Admission: EM | Admit: 2022-05-31 | Discharge: 2022-05-31 | Disposition: A | Discharge status: Home / Self Care | Payer: BC Managed Care – PPO | Attending: Urgent Care | Admitting: Urgent Care

## 2022-05-31 DIAGNOSIS — R110 Nausea: Secondary | ICD-10-CM | POA: Diagnosis not present

## 2022-05-31 DIAGNOSIS — R300 Dysuria: Secondary | ICD-10-CM | POA: Diagnosis not present

## 2022-05-31 DIAGNOSIS — B3749 Other urogenital candidiasis: Secondary | ICD-10-CM | POA: Insufficient documentation

## 2022-05-31 DIAGNOSIS — R102 Pelvic and perineal pain: Secondary | ICD-10-CM | POA: Insufficient documentation

## 2022-05-31 DIAGNOSIS — N898 Other specified noninflammatory disorders of vagina: Secondary | ICD-10-CM | POA: Diagnosis not present

## 2022-05-31 DIAGNOSIS — R109 Unspecified abdominal pain: Secondary | ICD-10-CM | POA: Insufficient documentation

## 2022-05-31 LAB — POCT URINALYSIS DIP (MANUAL ENTRY)
Bilirubin, UA: NEGATIVE
Glucose, UA: NEGATIVE mg/dL
Ketones, POC UA: NEGATIVE mg/dL
Leukocytes, UA: NEGATIVE
Nitrite, UA: NEGATIVE
Protein Ur, POC: NEGATIVE mg/dL
Spec Grav, UA: <=1.005 — AB (ref 1.010–1.025)
Urobilinogen, UA: 0.2 E.U./dL
pH, UA: 5.5 (ref 5.0–8.0)

## 2022-05-31 LAB — POCT URINE PREGNANCY: Preg Test, Ur: NEGATIVE

## 2022-05-31 MED ORDER — TOPIRAMATE 50 MG PO TABS: ORAL_TABLET | ORAL | 1 refills | Status: DC

## 2022-05-31 MED ORDER — METRONIDAZOLE 500 MG PO TABS
500.0000 mg | ORAL_TABLET | Freq: Two times a day (BID) | ORAL | 0 refills | Status: DC
Start: 2022-05-31 — End: 2022-06-10

## 2022-05-31 MED ORDER — PROMETHAZINE HCL 25 MG PO TABS: 25.0000 mg | ORAL_TABLET | Freq: Four times a day (QID) | ORAL | 0 refills | Status: DC | PRN

## 2022-05-31 NOTE — ED Provider Notes (Signed)
[place]   MRN: [id number] DOB: 01/19/1996  Subjective:   Faith Jordan is a 26 y.o. female presenting for 5 day history of acute onset dysuria, lower abdominal pain, vaginal discharge, now having dizziness, nausea without vomiting. She did a video visit and was started on Macrobid. No urine culture available. No concern for STI but is not opposed to being checked for this and in fact prefers to be checked.  She is trying to hydrate well, has used Zofran for her nausea and vomiting but is not working.  Would like something else.  Has a history of BV, last episode was about a year ago.  No current facility-administered medications for this encounter.  Current Outpatient Medications:    DULoxetine (CYMBALTA) 60 MG capsule, TAKE 1 CAPSULE BY MOUTH EVERY DAY, Disp: 90 capsule, Rfl: 0   Fremanezumab-vfrm (AJOVY) 225 MG/1.5ML SOAJ, Inject 1.5 mLs into the skin every 30 (thirty) days., Disp: 4.5 mL, Rfl: 1   gabapentin (NEURONTIN) 100 MG capsule, TAKE 1-2 CAPSULES (100-200 MG TOTAL) BY MOUTH AT BEDTIME. FOR ANXIETY., Disp: 60 capsule, Rfl: 0   hydrOXYzine (ATARAX) 10 MG tablet, Take 1 tablet (10 mg total) by mouth 3 (three) times daily as needed for anxiety., Disp: 30 tablet, Rfl: 0   NIKKI 3-0.02 MG tablet, Take 1 tablet by mouth daily., Disp: , Rfl:    nitrofurantoin, macrocrystal-monohydrate, (MACROBID) 100 MG capsule, Take 1 capsule (100 mg total) by mouth 2 (two) times daily., Disp: 10 capsule, Rfl: 0   propranolol (INDERAL) 20 MG tablet, TAKE 1 TABLET BY MOUTH EVERY DAY, Disp: 90 tablet, Rfl: 3   topiramate (TOPAMAX) 50 MG tablet, Take 1 tablet by mouth once daily, Disp: 90 tablet, Rfl: 1   traZODone (DESYREL) 50 MG tablet, Take 1 tablet (50 mg total) by mouth at bedtime. For sleep, Disp: 90 tablet, Rfl: 0   Ubrogepant (UBRELVY) 100 MG TABS, Take as directed, Disp: 10 tablet, Rfl: 10   Allergies  Allergen Reactions   Advil [Ibuprofen] Dermatitis    Similar to  Stevens-Johnson reaction, blistering peeling skin   Nsaids Dermatitis    Reaction similar to Stephens-Johnson, blistering peeling rash   Sulfa Antibiotics Swelling    Facial swelling    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   Angio-edema 04/27/2016   Angioedema    Aquagenic angio-edema-urticaria    Asthma    no problems /not used recently   Dysautonomia ([place])    Dysrhythmia    sinus tachycardia   Fibroid    right breast, adenoma   Headache(784.0)    Hives 12/22/2015   Pneumonia    hx  6th grade   Vasculitis ([place])      Past Surgical History:  Procedure Laterality Date   ADENOIDECTOMY     BREAST LUMPECTOMY Right    BREAST LUMPECTOMY Right    MASS EXCISION Right 10/07/2014   Procedure: EXCISION OF RIGHT BREAST MASS;  Surgeon: Jackolyn Confer, MD;  Location: [facility];  Service: General;  Laterality: Right;   TONSILLECTOMY AND ADENOIDECTOMY     TOOTH EXTRACTION      Family History  Problem Relation Age of Onset   Breast cancer Maternal Grandmother 31   Myasthenia gravis Maternal Grandfather    Migraines Mother    Narcolepsy Mother    Hypothyroidism Mother    Asthma Other    Cancer Other    Epilepsy Other  Allergic rhinitis Neg Hx    Angioedema Neg Hx    Atopy Neg Hx    Eczema Neg Hx    Immunodeficiency Neg Hx    Urticaria Neg Hx     Social History   Tobacco Use   Smoking status: Never   Smokeless tobacco: Never  Vaping Use   Vaping Use: Never used  Substance Use Topics   Alcohol use: No   Drug use: No    ROS   Objective:   Vitals: BP (!) 132/91   Pulse 73   Temp 98.1 F (36.7 C)   Resp 20   SpO2 98%   Physical Exam Constitutional:      General: She is not in acute distress.    Appearance: Normal appearance. She is well-developed. She is not ill-appearing, toxic-appearing or diaphoretic.  HENT:     Head: Normocephalic and atraumatic.     Nose: Nose normal.     Mouth/Throat:     Mouth: Mucous  membranes are moist.     Pharynx: Oropharynx is clear.  Eyes:     General: No scleral icterus.       Right eye: No discharge.        Left eye: No discharge.     Extraocular Movements: Extraocular movements intact.     Conjunctiva/sclera: Conjunctivae normal.  Cardiovascular:     Rate and Rhythm: Normal rate.  Pulmonary:     Effort: Pulmonary effort is normal.  Abdominal:     General: Bowel sounds are normal. There is no distension.     Palpations: Abdomen is soft. There is no mass.     Tenderness: There is abdominal tenderness in the right lower quadrant, periumbilical area, suprapubic area and left lower quadrant. There is no right CVA tenderness, left CVA tenderness, guarding or rebound.  Skin:    General: Skin is warm and dry.  Neurological:     General: No focal deficit present.     Mental Status: She is alert and oriented to person, place, and time.  Psychiatric:        Mood and Affect: Mood normal.        Behavior: Behavior normal.        Thought Content: Thought content normal.        Judgment: Judgment normal.     Results for orders placed or performed during the hospital encounter of 05/31/22 (from the past 24 hour(s))  POCT urine pregnancy     Status: None   Collection Time: 05/31/22 10:39 AM  Result Value Ref Range   Preg Test, Ur Negative Negative  POCT urinalysis dipstick     Status: Abnormal   Collection Time: 05/31/22 10:40 AM  Result Value Ref Range   Color, UA yellow yellow   Clarity, UA clear clear   Glucose, UA negative negative mg/dL   Bilirubin, UA negative negative   Ketones, POC UA negative negative mg/dL   Spec Grav, UA <=1.005 (A) 1.010 - 1.025   Blood, UA trace-intact (A) negative   pH, UA 5.5 5.0 - 8.0   Protein Ur, POC negative negative mg/dL   Urobilinogen, UA 0.2 0.2 or 1.0 E.U./dL   Nitrite, UA Negative Negative   Leukocytes, UA Negative Negative    Assessment and Plan :   PDMP not reviewed this encounter.  1. Acute pelvic pain,  female   2. Flank pain   3. Dysuria   4. Vaginal discharge   5. Nausea without vomiting    Will  cover for concurrent bacterial vaginosis with Flagyl given urinalysis was negative.  Urine culture and vaginal swab results pending.  Recommended supportive care otherwise including hydration, Tylenol and ibuprofen, Phenergan instead of Zofran for the vomiting. Counseled patient on potential for adverse effects with medications prescribed/recommended today, ER and return-to-clinic precautions discussed, patient verbalized understanding.    Jaynee Eagles, PA-C 05/31/22 1054

## 2022-05-31 NOTE — Discharge Instructions (Addendum)
Please start Flagyl to address a suspected BV infection.  You can finish out your Keflex dosing. Make sure you hydrate very well with plain water and a quantity of 64 ounces of water a day.  Please limit drinks that are considered urinary irritants such as soda, sweet tea, coffee, energy drinks, alcohol.  These can worsen your urinary and genital symptoms but also be the source of them.  I will let you know about your urine culture and vaginal swab results through MyChart to see if we need to prescribe or change your antibiotics based off of those results.

## 2022-05-31 NOTE — ED Triage Notes (Addendum)
Pt here with bilateral lower abdominal pain, burning during urination and abnormal vaginal discharge x 5 days. Pt is on Macrobid for suspected UTI via a video.

## 2022-06-01 ENCOUNTER — Other Ambulatory Visit: Payer: Self-pay | Admitting: Family Medicine

## 2022-06-01 ENCOUNTER — Encounter: Payer: Self-pay | Admitting: Family Medicine

## 2022-06-01 ENCOUNTER — Ambulatory Visit (INDEPENDENT_AMBULATORY_CARE_PROVIDER_SITE_OTHER): Payer: BC Managed Care – PPO | Admitting: Family Medicine

## 2022-06-01 ENCOUNTER — Ambulatory Visit: Payer: BC Managed Care – PPO | Admitting: Psychology

## 2022-06-01 VITALS — BP 126/86 | HR 96 | Ht 67.0 in | Wt 155.0 lb

## 2022-06-01 DIAGNOSIS — F411 Generalized anxiety disorder: Secondary | ICD-10-CM

## 2022-06-01 DIAGNOSIS — G4719 Other hypersomnia: Secondary | ICD-10-CM | POA: Diagnosis not present

## 2022-06-01 DIAGNOSIS — G43709 Chronic migraine without aura, not intractable, without status migrainosus: Secondary | ICD-10-CM | POA: Diagnosis not present

## 2022-06-01 LAB — CERVICOVAGINAL ANCILLARY ONLY
Bacterial Vaginitis (gardnerella): NEGATIVE
Candida Glabrata: NEGATIVE
Candida Vaginitis: POSITIVE — AB
Chlamydia: NEGATIVE
Comment: NEGATIVE
Comment: NEGATIVE
Comment: NEGATIVE
Comment: NEGATIVE
Comment: NEGATIVE
Comment: NORMAL
Neisseria Gonorrhea: NEGATIVE
Trichomonas: NEGATIVE

## 2022-06-01 MED ORDER — EMGALITY 120 MG/ML ~~LOC~~ SOAJ: 120.0000 mg | SUBCUTANEOUS | 3 refills | Status: DC

## 2022-06-01 NOTE — Progress Notes (Signed)
PATIENT: Faith Jordan DOB: 10-13-1996  REASON FOR VISIT: follow up HISTORY FROM: patient  Chief Complaint  Patient presents with   Follow-up    Rm 2 alone here for f/u. Reports since starting the ajovy 4 months ago migraines have been worse. St severity and frequency have been worse, Roselyn Meier does help when she takes it.      HISTORY OF PRESENT ILLNESS:  06/01/22 ALL: Faith Jordan returns for follow up for migraines. She was last seen 05/2021 and doing well. We advised to continue [place] and discussed weaning topiramate as headaches were rare. She was switched to [place] in 02/2022 due to insurance coverage and has continued topiramate. Since, migraines have worsened. She is having 4-5 migraines every month. Roselyn Meier continues to offer relief. She has had more dizziness and nausea with headaches.   She was seen in consult with Dr Brett Fairy 05/2022 for excessive sleepiness and witnessed apneic events. She has a history of OSA as a child resolved following tonsil and adenoids removed. Sleep study was ordered.   06/04/2021 ALL: Faith Jordan returns for follow up for migraines. She continues Emgality, topiramate '50mg'$  daily and Ubrelvy as needed. She is doing very well. She rarely has headaches. She can not remember the last time she took [place]. She recently switched birth control to [place]. She is feeling well today and without concerns.   06/03/2020 ALL:  Faith Jordan is a 26 y.o. female here today for follow up for migraines. She continues topiramate, Emgality and propranolol for prevention. Roselyn Meier helps with migraine abortion. She reports that she is doing very well from a migraine perspective. She can not remember the last time she needed to take [place]. She may have 1-2 mild headaches just prior to due date for Emgality. 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 She is staying very busy.   HISTORY: (copied from my note on 05/31/2019)  Faith Jordan is a 26 y.o.  female here today for follow up of migraines. She was restarted on topiramate 50 mg at night about 4 months ago. She continued Emgality injections and propranalol (originally prescribed for tachycardia).    She is taking Amethia for birth control. She is taking OCP continuously for three months with quarterly menstrual cycles. She reports that migraines are very well managed until the week of menstrual cycle. She states that migraine is usually retro orbital (usually right but can be on the left) with pounding and light/sound sensitivity. She has tried [place], Aleve, and Tylenol intermittently for acute management but does not feel anything helps. She can not tolerate triptans due to tachycardia. She has a reported allergy of dermatitis with ibuprofen. She does not have any reaction with Aleve.    She is currently on her menstrual cycle. She has a terrible migraine that is mostly located behind her right eye. She does have mild pain of left forehead as well. She is requesting a nerve block today as this has worked well in the past.    HISTORY: (copied from my note on 01/16/2019)   Faith Jordan is a 26 y.o. female seen today for follow up.  She reports that her migraines have worsened specifically over the last month.  She feels that stress is definitely a trigger.  She is in school and finishing up with exams at this time.  She knows that weather also contributes.  She notices significant worsening around the time of her menstrual cycle.  Pain  is typically right-sided retro-orbital pressure.  Occasionally there is throbbing, light sensitivity and nausea.  She continues Emgality every month.  She is using Roselyn Meier for abortive therapy that does help temporarily.  She is also on propranolol for tachycardia.  She reports a history of syncope as well that is followed closely by her cardiologist.   She has tried topiramate in the past but is unsure why this medication was stopped.  She is also tried  imipemide but reports that it did not help.  She has taken Maxalt and Imitrex for abortive therapy but reports tachycardia with a triptan therapy.  She is allergic to NSAIDs.   REVIEW OF SYSTEMS: Out of a complete 14 system review of symptoms, the patient complains only of the following symptoms, headaches, fatigue, dizziness  and all other reviewed systems are negative.  ALLERGIES: 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, blistering peeling skin   Nsaids Dermatitis    Reaction similar to Stephens-Johnson, blistering peeling rash   Sulfa Antibiotics Swelling    Facial swelling    HOME MEDICATIONS: Outpatient Medications Prior to Visit  Medication Sig Dispense Refill   DULoxetine (CYMBALTA) 60 MG capsule TAKE 1 CAPSULE BY MOUTH EVERY DAY 90 capsule 0   gabapentin (NEURONTIN) 100 MG capsule TAKE 1-2 CAPSULES (100-200 MG TOTAL) BY MOUTH AT BEDTIME. FOR ANXIETY. 60 capsule 0   hydrOXYzine (ATARAX) 10 MG tablet Take 1 tablet (10 mg total) by mouth 3 (three) times daily as needed for anxiety. 30 tablet 0   metroNIDAZOLE (FLAGYL) 500 MG tablet Take 1 tablet (500 mg total) by mouth 2 (two) times daily with a meal. DO NOT CONSUME ALCOHOL WHILE TAKING THIS MEDICATION. 14 tablet 0   NIKKI 3-0.02 MG tablet Take 1 tablet by mouth daily.     nitrofurantoin, macrocrystal-monohydrate, (MACROBID) 100 MG capsule Take 1 capsule (100 mg total) by mouth 2 (two) times daily. 10 capsule 0   promethazine (PHENERGAN) 25 MG tablet Take 1 tablet (25 mg total) by mouth every 6 (six) hours as needed for nausea or vomiting. 30 tablet 0   propranolol (INDERAL) 20 MG tablet TAKE 1 TABLET BY MOUTH EVERY DAY 90 tablet 3   topiramate (TOPAMAX) 50 MG tablet Take 1 tablet by mouth once daily 90 tablet 1   traZODone (DESYREL) 50 MG tablet Take 1 tablet (50 mg total) by mouth at bedtime. For sleep 90 tablet 0   Ubrogepant (UBRELVY) 100 MG TABS Take as directed 10  tablet 10   Fremanezumab-vfrm (AJOVY) 225 MG/1.5ML SOAJ Inject 1.5 mLs into the skin every 30 (thirty) days. 4.5 mL 1   No facility-administered medications prior to visit.    PAST MEDICAL HISTORY: 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   Angio-edema 04/27/2016   Angioedema    Aquagenic angio-edema-urticaria    Asthma    no problems /not used recently   Dysautonomia ([place])    Dysrhythmia    sinus tachycardia   Fibroid    right breast, adenoma   Headache(784.0)    Hives 12/22/2015   Pneumonia    hx  6th grade   Vasculitis ([place])     PAST SURGICAL HISTORY: Past Surgical History:  Procedure Laterality Date   ADENOIDECTOMY     BREAST LUMPECTOMY Right    BREAST LUMPECTOMY Right    MASS EXCISION Right 10/07/2014   Procedure: EXCISION OF RIGHT BREAST MASS;  Surgeon: Jackolyn Confer, MD;  Location: [facility];  Service: General;  Laterality: Right;   TONSILLECTOMY AND ADENOIDECTOMY     TOOTH EXTRACTION      FAMILY HISTORY: Family History  Problem Relation Age of Onset   Breast cancer Maternal Grandmother 71   Myasthenia gravis Maternal Grandfather    Migraines Mother    Narcolepsy Mother    Hypothyroidism Mother    Asthma Other    Cancer Other    Epilepsy Other    Allergic rhinitis Neg Hx    Angioedema Neg Hx    Atopy Neg Hx    Eczema Neg Hx    Immunodeficiency Neg Hx    Urticaria Neg Hx     SOCIAL HISTORY: Social History   Socioeconomic History   Marital status: Significant Other    Spouse name: Not on file   Number of children: Not on file   Years of education: Not on file   Highest education level: Bachelor's degree (e.g., BA, AB, BS)  Occupational History   Not on file  Tobacco Use   Smoking status: Never   Smokeless tobacco: Never  Vaping Use   Vaping Use: Never used  Substance and Sexual Activity   Alcohol use: No   Drug use: No   Sexual activity: Not on file  Other Topics Concern   Not on file  Social  History Narrative   Lives with fiance   R handed   Caffeine: 1 drink a day   Social Determinants of Health   Financial Resource Strain: Not on file  Food Insecurity: Not on file  Transportation Needs: Not on file  Physical Activity: Not on file  Stress: Not on file  Social Connections: Not on file  Intimate Partner Violence: Not on file      PHYSICAL EXAM  Vitals:   06/01/22 1442  BP: 126/86  Pulse: 96  Weight: 155 lb (70.3 kg)  Jordan: '5\' 7"'$  (1.702 m)     Body mass index is 24.28 kg/m.  Generalized: Well developed, in no acute distress  Cardiology: normal rate and rhythm, no murmur noted Respiratory: clear to auscultation bilaterally  Neurological examination  Mentation: Alert oriented to time, place, history taking. Follows all commands speech and language fluent Cranial nerve II-XII: Pupils were equal round reactive to light. Extraocular movements were full, visual field were full  Motor: The motor testing reveals 5 over 5 strength of all 4 extremities. Good symmetric motor tone is noted throughout.  Gait and station: Gait is normal.   DIAGNOSTIC DATA (LABS, IMAGING, TESTING) - I reviewed patient records, labs, notes, testing and imaging myself where available.      No data to display           Lab Results  Component Value Date   WBC 8.8 11/21/2020   HGB 14.1 11/21/2020   HCT 41.0 11/21/2020   MCV 87.2 11/21/2020   PLT 314 11/21/2020      Component Value Date/Time   NA 137 11/21/2020 1909   NA 142 09/28/2017 1459   K 3.5 11/21/2020 1909   CL 109 11/21/2020 1909   CO2 20 (L) 11/21/2020 1909   GLUCOSE 99 11/21/2020 1909   BUN 11 11/21/2020 1909   BUN 3 (L) 09/28/2017 1459   CREATININE 0.61 11/21/2020 1909   CALCIUM 8.9 11/21/2020 1909   PROT 6.7 11/06/2019 0855   PROT 6.5 09/28/2017 1459   ALBUMIN 4.2 11/06/2019 0855   ALBUMIN 4.0 09/28/2017 1459   AST  11 11/06/2019 0855   ALT 9 11/06/2019 0855   ALKPHOS 43 11/06/2019 0855   BILITOT 0.5  11/06/2019 0855   BILITOT 0.2 09/28/2017 1459   GFRNONAA >60 11/21/2020 1909   GFRAA >60 12/01/2017 1018   Lab Results  Component Value Date   CHOL 200 04/27/2018   HDL 57.80 04/27/2018   LDLCALC 111 (H) 04/27/2018   TRIG 153.0 (H) 04/27/2018   CHOLHDL 3 04/27/2018   No results found for: "HGBA1C" Lab Results  Component Value Date   VITAMINB12 373 07/30/2021   Lab Results  Component Value Date   TSH 0.83 07/30/2021     ASSESSMENT AND PLAN 26 y.o. year old female  has a past medical history of Acne, Acquired breast deformity (12/22/2015), Angio-edema (04/27/2016), Angioedema, Aquagenic angio-edema-urticaria, Asthma, Dysautonomia ([place]), Dysrhythmia, Fibroid, Headache(784.0), Hives (12/22/2015), Pneumonia, and Vasculitis ([place]). here with     ICD-10-CM   1. Chronic migraine without aura without status migrainosus, not intractable  G43.709     2. Excessive daytime sleepiness  G47.19         Brianna reports headaches have worsened since switching from [place] to [place]. Headaches were very well managed on Emgality. We will restart Emgality monthly and continue topiramate '50mg'$  for prevention and continue Ubrelvy for abortive therapy. She will continue propranolol '20mg'$  daily for tachycardia, managed by cardiology. Advised against pregnancy. She will continue healthy lifestyle habits. She will follow up with me in 4-6 months, sooner if needed.    No orders of the defined types were placed in this encounter.     Meds ordered this encounter  Medications   Galcanezumab-gnlm (EMGALITY) 120 MG/ML SOAJ    Sig: Inject 120 mg into the skin every 30 (thirty) days.    Dispense:  3 mL    Refill:  3    Order Specific Question:   Supervising Provider    Answer:   Melvenia Beam [[id number]]       KVQ QVZDG, FNP-C 06/01/2022, 3:28 PM [facility] [address] [place], [place] [address] [phone]

## 2022-06-01 NOTE — Patient Instructions (Addendum)
Below is our plan:  We will try to switch you back to [facility] now that we have failed Ajovy. Continue Ubrelvy for abortive therapy.   Please make sure you are staying well hydrated. I recommend 50-60 ounces daily. Well balanced diet and regular exercise encouraged. Consistent sleep schedule with 6-8 hours recommended.   Please continue follow up with care team as directed.   Follow up with me in 4-6 months   You may receive a survey regarding today's visit. I encourage you to leave honest feed back as I do use this information to improve patient care. Thank you for seeing me today!

## 2022-06-02 ENCOUNTER — Ambulatory Visit (INDEPENDENT_AMBULATORY_CARE_PROVIDER_SITE_OTHER)
Admission: RE | Admit: 2022-06-02 | Discharge: 2022-06-02 | Disposition: A | Discharge status: Home / Self Care | Payer: BC Managed Care – PPO | Source: Ambulatory Visit | Attending: Primary Care | Admitting: Primary Care

## 2022-06-02 ENCOUNTER — Telehealth (HOSPITAL_COMMUNITY): Payer: Self-pay | Admitting: Emergency Medicine

## 2022-06-02 ENCOUNTER — Encounter: Payer: Self-pay | Admitting: Primary Care

## 2022-06-02 ENCOUNTER — Ambulatory Visit (INDEPENDENT_AMBULATORY_CARE_PROVIDER_SITE_OTHER): Payer: BC Managed Care – PPO | Admitting: Primary Care

## 2022-06-02 VITALS — BP 116/62 | HR 74 | Temp 98.6°F | Ht 67.0 in | Wt 153.0 lb

## 2022-06-02 DIAGNOSIS — B3731 Acute candidiasis of vulva and vagina: Secondary | ICD-10-CM

## 2022-06-02 DIAGNOSIS — R109 Unspecified abdominal pain: Secondary | ICD-10-CM | POA: Insufficient documentation

## 2022-06-02 DIAGNOSIS — M545 Low back pain, unspecified: Secondary | ICD-10-CM | POA: Diagnosis not present

## 2022-06-02 HISTORY — DX: Unspecified abdominal pain: R10.9

## 2022-06-02 HISTORY — DX: Acute candidiasis of vulva and vagina: B37.31

## 2022-06-02 LAB — BASIC METABOLIC PANEL
BUN: 12 mg/dL (ref 6–23)
CO2: 22 mEq/L (ref 19–32)
Calcium: 9.7 mg/dL (ref 8.4–10.5)
Chloride: 106 mEq/L (ref 96–112)
Creatinine, Ser: 0.81 mg/dL (ref 0.40–1.20)
GFR: 100.18 mL/min (ref >60.00)
Glucose, Bld: 87 mg/dL (ref 70–99)
Potassium: 4.2 mEq/L (ref 3.5–5.1)
Sodium: 133 mEq/L — ABNORMAL LOW (ref 135–145)

## 2022-06-02 LAB — URINE CULTURE: Culture: <10000 — AB

## 2022-06-02 LAB — CBC WITH DIFFERENTIAL/PLATELET
Basophils Absolute: 0.1 10*3/uL (ref 0.0–0.1)
Basophils Relative: 0.5 % (ref 0.0–3.0)
Eosinophils Absolute: 0.1 10*3/uL (ref 0.0–0.7)
Eosinophils Relative: 0.7 % (ref 0.0–5.0)
HCT: 45.7 % (ref 36.0–46.0)
Hemoglobin: 15.5 g/dL — ABNORMAL HIGH (ref 12.0–15.0)
Lymphocytes Relative: 28.6 % (ref 12.0–46.0)
Lymphs Abs: 2.8 10*3/uL (ref 0.7–4.0)
MCHC: 33.9 g/dL (ref 30.0–36.0)
MCV: 87.9 fl (ref 78.0–100.0)
Monocytes Absolute: 0.8 10*3/uL (ref 0.1–1.0)
Monocytes Relative: 8.3 % (ref 3.0–12.0)
Neutro Abs: 6 10*3/uL (ref 1.4–7.7)
Neutrophils Relative %: 61.9 % (ref 43.0–77.0)
Platelets: 348 10*3/uL (ref 150.0–400.0)
RBC: 5.2 Mil/uL — ABNORMAL HIGH (ref 3.87–5.11)
RDW: 12.8 % (ref 11.5–15.5)
WBC: 9.7 10*3/uL (ref 4.0–10.5)

## 2022-06-02 LAB — POC URINALSYSI DIPSTICK (AUTOMATED)
Bilirubin, UA: NEGATIVE
Blood, UA: NEGATIVE
Glucose, UA: NEGATIVE
Ketones, UA: NEGATIVE
Nitrite, UA: NEGATIVE
Protein, UA: NEGATIVE
Spec Grav, UA: 1.015 (ref 1.010–1.025)
pH, UA: 6 (ref 5.0–8.0)

## 2022-06-02 MED ORDER — FLUCONAZOLE 150 MG PO TABS: 150.0000 mg | ORAL_TABLET | Freq: Once | ORAL | 0 refills | Status: AC

## 2022-06-02 NOTE — Telephone Encounter (Signed)
Called patient added to open appointment today.  No further action needed at this time.

## 2022-06-02 NOTE — Assessment & Plan Note (Addendum)
Reviewed labs from Urgent Care visit from 05/31/22. Urgent care sent Rx for fluconazole 150 mg. Discussed with patient. Remain off metronidazole.

## 2022-06-02 NOTE — Progress Notes (Signed)
Subjective:    Patient ID: Faith Jordan, female    DOB: 1996/04/02, 26 y.o.   MRN: [id number]  Back Pain Pertinent negatives include no abdominal pain, dysuria or pelvic pain.    Faith Jordan is a very pleasant 26 y.o. female with a history of neurocardiogenic syncope, palpitations, anemia, sinus tachycardia, fatigue, GAD with panic attacks who presents today to discuss back pain and fatigue.  Evaluated virtually on 05/28/2022 for similar symptoms, was prescribed Macrobid 100 mg twice daily x5 days.  No urine sample or culture was collected.  Evaluated at [facility] urgent care on 05/31/2022 for a 5-day history of dysuria, lower abdominal pain, vaginal discharge, dizziness, nausea without vomiting.  During her visit in urgent care her UA was negative.  She was prescribed metronidazole for presumed bacterial vaginosis.  Vaginal swab returned several days later which revealed Candida vaginitis. Urine pregnancy test was negative.   Today she reports continued nausea and left sided flank pain with intermittent radiation across her right mid back, diaphoresis, dizziness, and fatigue. Her left flank pain has progressed. She's taken Zofran and Phenergan with temporary improvement with nausea. Nausea is worse with eating. She was never treated for her vaginal yeast infection. She stopped her metronidazole yesterday as she saw that she tested negative for BV.   She denies fevers, dysuria, hematuria, abdominal pain, injury/trauma, heavy lifting, diarrhea/constipation, history of renal stones, history of pyelonephritis. She had to leave work two days ago due to these symptoms. She is compliant to her birth control daily as prescribed.   Review of Systems  Gastrointestinal:  Positive for nausea. Negative for abdominal pain, constipation, diarrhea and vomiting.  Genitourinary:  Positive for flank pain. Negative for difficulty urinating, dysuria, frequency, hematuria, pelvic pain and vaginal  discharge.  Musculoskeletal:  Positive for back pain.         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   Angio-edema 04/27/2016   Angioedema    Aquagenic angio-edema-urticaria    Asthma    no problems /not used recently   Dysautonomia ([place])    Dysrhythmia    sinus tachycardia   Fibroid    right breast, adenoma   Headache(784.0)    Hives 12/22/2015   Pneumonia    hx  6th grade   Vasculitis ([facility])     Social History   Socioeconomic History   Marital status: Significant Other    Spouse name: Not on file   Number of children: Not on file   Years of education: Not on file   Highest education level: Bachelor's degree (e.g., BA, AB, BS)  Occupational History   Not on file  Tobacco Use   Smoking status: Never   Smokeless tobacco: Never  Vaping Use   Vaping Use: Never used  Substance and Sexual Activity   Alcohol use: No   Drug use: No   Sexual activity: Not on file  Other Topics Concern   Not on file  Social History Narrative   Lives with fiance   R handed   Caffeine: 1 drink a day   Social Determinants of Health   Financial Resource Strain: Not on file  Food Insecurity: Not on file  Transportation Needs: Not on file  Physical Activity: Not on file  Stress: Not on file  Social Connections: Not on file  Intimate Partner Violence: Not on file    Past Surgical History:  Procedure Laterality Date   ADENOIDECTOMY  BREAST LUMPECTOMY Right    BREAST LUMPECTOMY Right    MASS EXCISION Right 10/07/2014   Procedure: EXCISION OF RIGHT BREAST MASS;  Surgeon: Jackolyn Confer, MD;  Location: [facility];  Service: General;  Laterality: Right;   TONSILLECTOMY AND ADENOIDECTOMY     TOOTH EXTRACTION      Family History  Problem Relation Age of Onset   Breast cancer Maternal Grandmother 76   Myasthenia gravis Maternal Grandfather    Migraines Mother    Narcolepsy Mother    Hypothyroidism Mother    Asthma Other    Cancer Other     Epilepsy Other    Allergic rhinitis Neg Hx    Angioedema Neg Hx    Atopy Neg Hx    Eczema Neg Hx    Immunodeficiency Neg Hx    Urticaria Neg Hx     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, blistering peeling skin   Nsaids Dermatitis    Reaction similar to Stephens-Johnson, blistering peeling rash   Sulfa Antibiotics Swelling    Facial swelling    Current Outpatient Medications on File Prior to Visit  Medication Sig Dispense Refill   DULoxetine (CYMBALTA) 60 MG capsule Take 1 capsule (60 mg total) by mouth daily. for anxiety 90 capsule 0   gabapentin (NEURONTIN) 100 MG capsule TAKE 1-2 CAPSULES (100-200 MG TOTAL) BY MOUTH AT BEDTIME. FOR ANXIETY. 60 capsule 0   hydrOXYzine (ATARAX) 10 MG tablet Take 1 tablet (10 mg total) by mouth 3 (three) times daily as needed for anxiety. 30 tablet 0   metroNIDAZOLE (FLAGYL) 500 MG tablet Take 1 tablet (500 mg total) by mouth 2 (two) times daily with a meal. DO NOT CONSUME ALCOHOL WHILE TAKING THIS MEDICATION. 14 tablet 0   NIKKI 3-0.02 MG tablet Take 1 tablet by mouth daily.     nitrofurantoin, macrocrystal-monohydrate, (MACROBID) 100 MG capsule Take 1 capsule (100 mg total) by mouth 2 (two) times daily. 10 capsule 0   promethazine (PHENERGAN) 25 MG tablet Take 1 tablet (25 mg total) by mouth every 6 (six) hours as needed for nausea or vomiting. 30 tablet 0   propranolol (INDERAL) 20 MG tablet TAKE 1 TABLET BY MOUTH EVERY DAY 90 tablet 3   topiramate (TOPAMAX) 50 MG tablet Take 1 tablet by mouth once daily 90 tablet 1   traZODone (DESYREL) 50 MG tablet Take 1 tablet (50 mg total) by mouth at bedtime. For sleep 90 tablet 0   Ubrogepant (UBRELVY) 100 MG TABS Take as directed 10 tablet 10   Galcanezumab-gnlm (EMGALITY) 120 MG/ML SOAJ Inject 120 mg into the skin every 30 (thirty) days. (Patient not taking: Reported on 06/02/2022) 3 mL 3   No current facility-administered medications on  file prior to visit.    BP 116/62   Pulse 74   Temp 98.6 F (37 C) (Oral)   Ht '5\' 7"'$  (1.702 m)   Wt 153 lb (69.4 kg)   SpO2 98%   BMI 23.96 kg/m  Objective:   Physical Exam Constitutional:      General: She is in acute distress.  Cardiovascular:     Rate and Rhythm: Normal rate and regular rhythm.  Pulmonary:     Effort: Pulmonary effort is normal.     Breath sounds: Normal breath sounds.  Abdominal:     Palpations: Abdomen is soft.     Tenderness: There is no abdominal tenderness. There is left CVA tenderness.  Musculoskeletal:  Cervical back: Neck supple.  Skin:    General: Skin is warm and dry.           Assessment & Plan:   Problem List Items Addressed This Visit       Genitourinary   Vaginal yeast infection    Reviewed labs from Urgent Care visit from 05/31/22. Urgent care sent Rx for fluconazole 150 mg. Discussed with patient. Remain off metronidazole.        Other   Acute flank pain - Primary    Appears uncomfortable today.  UA today with 1+ leuks, otherwise negative. Culture pending.  Checking KUB plain films in office. CBC and BMP pending. Await lab results.  Continue Zofran and Phenergan PRN.   Reviewed urgent care notes from 05/31/22 and E-visit notes from 05/28/22. Will treat vaginal yeast infection.       Relevant Orders   POCT Urinalysis Dipstick (Automated)   CBC with Differential/Platelet   Basic metabolic panel   DG Abd 1 View   Urine Culture       Pleas Koch, NP

## 2022-06-02 NOTE — Assessment & Plan Note (Addendum)
Appears uncomfortable today.  UA today with 1+ leuks, otherwise negative. Culture pending.  Checking KUB plain films in office. CBC and BMP pending. Await lab results.  Continue Zofran and Phenergan PRN.   Reviewed urgent care notes from 05/31/22 and E-visit notes from 05/28/22. Will treat vaginal yeast infection.

## 2022-06-02 NOTE — Addendum Note (Signed)
Addended by: Ellamae Sia on: 06/02/2022 09:24 AM   Modules accepted: Orders

## 2022-06-02 NOTE — Patient Instructions (Addendum)
Stop by the lab and xray prior to leaving today. I will notify you of your results once received.   [facility] from [facility].  Push intake of water as tolerated.   It was a pleasure to see you today!

## 2022-06-04 LAB — NARCOLEPSY EVALUATION
DQA1*01:02: NEGATIVE
DQB1*06:02: NEGATIVE

## 2022-06-04 LAB — URINE CULTURE
MICRO NUMBER:: 13787873
Result:: NO GROWTH
SPECIMEN QUALITY:: ADEQUATE

## 2022-06-07 ENCOUNTER — Ambulatory Visit: Payer: BC Managed Care – PPO | Admitting: Family Medicine

## 2022-06-07 ENCOUNTER — Telehealth: Payer: Self-pay

## 2022-06-07 NOTE — Telephone Encounter (Signed)
I called patient to discuss. No answer, left a message asking her to call us back. I will also send patient a mychart message.

## 2022-06-07 NOTE — Telephone Encounter (Signed)
-----  Message from Larey Seat, MD sent at 06/04/2022  1:52 PM EDT ----- Narcolepsy associated HLA is negative.

## 2022-06-08 ENCOUNTER — Telehealth: Payer: Self-pay | Admitting: *Deleted

## 2022-06-08 NOTE — Telephone Encounter (Signed)
PA pending on Cover My Meds. See phone note.

## 2022-06-08 NOTE — Telephone Encounter (Addendum)
Emgality PA completed on Cover My Meds Key: [id number]. Also faxed office notes to Cover My Meds to be attached to the key. Received a receipt of confirmation.  [facility]ed [facility] and obtained pharmacy benefit information:  [facility] BIN: [id number] PCN: IRX GroupJodene Nam ID: [id number]

## 2022-06-09 ENCOUNTER — Other Ambulatory Visit: Payer: Self-pay | Admitting: *Deleted

## 2022-06-09 ENCOUNTER — Encounter: Payer: Self-pay | Admitting: *Deleted

## 2022-06-09 MED ORDER — UBRELVY 100 MG PO TABS: ORAL_TABLET | ORAL | 3 refills | Status: DC
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
## 2022-06-09 NOTE — Telephone Encounter (Signed)
Appeal letter has been written and is pending Amy NP's signature. Fax number for appeal is: [phone].

## 2022-06-09 NOTE — Telephone Encounter (Signed)
Emgality denied due to not meeting criteria. We did submit criteria that showed patient met chronic migraine diagnosis criteria but she has not tried nor had a documented contraindication to Arab which is required before Emgality can be approved.   Per your health plan's criteria, this drug is covered if you meet the following: (1) Your doctor provides medical records (for example: chart notes) showing that you have greater than or equal to 15 headache days per month, of which at least 8 must be migraine days for at least 3 months. (2) There are paid claims or your doctor provides medical records (for example: chart notes) showing that you have tried or cannot use Aimovig . The information provided does not show that you meet the criteria listed above. Please speak with your doctor about your choices. Reviewed by: Addison [email or web address]  To appeal, send written comments, documents or other information to be considered to: [facility] [address] [place] [address] Phone: [phone] Fax: [phone]  Case ID: [id number]

## 2022-06-10 ENCOUNTER — Encounter: Payer: Self-pay | Admitting: Psychiatry

## 2022-06-10 ENCOUNTER — Ambulatory Visit (INDEPENDENT_AMBULATORY_CARE_PROVIDER_SITE_OTHER): Payer: BC Managed Care – PPO | Admitting: Psychology

## 2022-06-10 ENCOUNTER — Telehealth: Payer: BC Managed Care – PPO | Admitting: Primary Care

## 2022-06-10 ENCOUNTER — Ambulatory Visit (INDEPENDENT_AMBULATORY_CARE_PROVIDER_SITE_OTHER): Payer: BC Managed Care – PPO | Admitting: Psychiatry

## 2022-06-10 VITALS — BP 118/81 | HR 83 | Temp 98.7°F | Wt 159.4 lb

## 2022-06-10 DIAGNOSIS — F411 Generalized anxiety disorder: Secondary | ICD-10-CM | POA: Diagnosis not present

## 2022-06-10 DIAGNOSIS — F410 Panic disorder [episodic paroxysmal anxiety] without agoraphobia: Secondary | ICD-10-CM

## 2022-06-10 MED ORDER — DULOXETINE HCL 30 MG PO CPEP: 30.0000 mg | ORAL_CAPSULE | Freq: Every day | ORAL | 1 refills | Status: DC

## 2022-06-10 NOTE — Telephone Encounter (Signed)
Appeal has been faxed to [phone]. Received a receipt of confirmation. Will update when we have a determination. I did ask for urgent review.

## 2022-06-10 NOTE — Patient Instructions (Signed)
Increase duloxetine 90 mg daily Continue gabapentin 100 mg at night

## 2022-06-10 NOTE — Telephone Encounter (Signed)
Received fax from [facility]. Decision has been overturned and Emgality is now approved through 12/11/22. I have faxed the letter to pt's pharmacy. Received a receipt of confirmation.

## 2022-06-11 NOTE — Telephone Encounter (Signed)
At 3:04 on 06-10-22 pt left vm stating she was returning the call to [place], Therapist, sports.  Pt is asking for either a call back([phone]) or a my chart message.

## 2022-06-13 NOTE — Progress Notes (Signed)
[facility] Counselor/Therapist Progress Note  Patient ID: ONESHA KREBBS, MRN: [id number],    Date: 06/10/2022  Time Spent: 60 minutes  Treatment Type: Individual Therapy - The patient was seen via video visit.  She gave verbal consent for the session to be on video on web ex.  The patient was in her home alone and therapist was in the office.  Reported Symptoms: anxiety, panic attacks  Mental Status Exam: Appearance:  Casual     Behavior: Appropriate  Motor: Normal  Speech/Language:  Normal Rate  Affect: Blunt  Mood: anxious  Thought process: normal  Thought content:   WNL  Sensory/Perceptual disturbances:   WNL  Orientation: oriented to person, place, time/date, and situation  Attention: Good  Concentration: Good  Memory: WNL  Fund of knowledge:  Good  Insight:   Fair  Judgment:  Good  Impulse Control: Good   Risk Assessment: Danger to Self:  No Self-injurious Behavior: No Danger to Others: No Duty to Warn:no Physical Aggression / Violence:No  Access to Firearms a concern: No  Gang Involvement:No   Subjective: The patient was seen for an individual therapy session via video visit today.  The patient gave verbal permission for the session to be on video on web ex.  The patient was in her home alone and therapist was in the office.  The patient reports that she started reading the book codependent no more than I recommended to her and she says it is boring.  I asked her whether she would like to feel less anxious and that she needed to understand some of the dynamics that are going on with her and her family.  We started to talk about cognitive distortions however the patient continues to be extremely anxious.  She is not doing anything to offload her anxiety.  I recommended that she do mindfulness and meditation and we practiced some mindfulness and meditation exercises during the session.  I told her that I wanted her to do something at least 5 minutes a  day and gave her some resources to look at so that she can start this process.  I also recommended that she write down the things she is worried about and the steps that is going to take so she does not have to worry about and marked those things off the list.  Provided cognitive behavioral therapy today as well as mindfulness and meditation activities.  Interventions: Cognitive Behavioral Therapy and Assertiveness/Communication,, problem solving, psychoeducation,  EMDR as indicated  Diagnosis:GAD (generalized anxiety disorder)  Panic disorder  Plan: Client Abilities/Strengths  Intelligent, insightful, motivated  Client Treatment Preferences  Outpatient Individual therapy every other week  Client Statement of Needs  " I feel better now, but still need some help with anxiety"  Treatment Level  Outpatient Individual therapy  Symptoms  Frustration and anxiety related to providing oversight and caretaking to an aging, ailing, and dependent  parent.: No Description Entered (Status: improved). Hypervigilance (e.g., feeling constantly on edge,  experiencing concentration difficulties, having trouble falling or staying asleep, exhibiting a general  state of irritability).: No Description Entered (Status: improved). Motor tension (e.g., restlessness,  tiredness, shakiness, muscle tension).: No Description Entered (Status: improved).  Problems Addressed  Anxiety, Phase Of Life Problems, Anxiety  Goals 1. Learn and implement coping skills that result in a reduction of anxiety  and worry, and improved daily functioning. Objective Learn and implement calming skills to reduce overall anxiety and manage anxiety symptoms. Target Date: 2024-08-09Frequency: Weekly Progress: 0  Modality: individual  Related Interventions 1. Teach the client calming/relaxation skills (e.g., applied relaxation, progressive muscle  relaxation, cue controlled relaxation; mindful breathing; biofeedback) and how to  discriminate  better between relaxation and tension; teach the client how to apply these skills to his/her daily  life (e.g., New Directions in Progressive Muscle Relaxation by Casper Harrison, and  Hazlett-Stevens; Treating Generalized Anxiety Disorder by Rygh and Amparo Bristol). Objective Identify, challenge, and replace biased, fearful self-talk with positive, realistic, and empowering selftalk. Target Date: 2023-05-27 Frequency: weekly Progress: 0 Modality: individual Related Interventions 1. Explore the client's schema and self-talk that mediate his/her fear response; assist him/her in  challenging the biases; replace the distorted messages with reality-based alternatives and  positive, realistic self-talk that will increase his/her self-confidence in coping with irrational  fears (see Cognitive Therapy of Anxiety Disorders by Alison Stalling). Objective Learn and implement problem-solving strategies for realistically addressing worries. Target Date: 2024-08-09Frequency: weekly Progress: 0 Modality: individual 2. Resolve conflicted feelings and adapt to the new life circumstances. Objective Apply problem-solving skills to current circumstances. Target Date: 2023-05-27 Frequency: weekly Progress: 0 Modality: individual Related Interventions 1. Teach the client problem-resolution skills (e.g., defining the problem clearly, brainstorming  multiple solutions, listing the pros and cons of each solution, seeking input from others,  selecting and implementing a plan of action, evaluating outcome, and readjusting plan as  necessary).   3. Stabilize anxiety level while increasing ability to function on a daily  basis. 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  The patient approved this plan.   Edelmiro Innocent G Malike Foglio, LCSW                  Kennede Lusk G Tayna Smethurst, LCSW               Charnika Herbst G Glora Hulgan, LCSW

## 2022-06-14 NOTE — Telephone Encounter (Signed)
I called patient back. No answer, left a message asking her to call us back. I'm unsure of what the question is regarding. I have already sent her a mychart message with her HLA gene results. Please send call to POD 1.

## 2022-06-24 ENCOUNTER — Ambulatory Visit (INDEPENDENT_AMBULATORY_CARE_PROVIDER_SITE_OTHER): Payer: BC Managed Care – PPO | Admitting: Psychology

## 2022-06-24 DIAGNOSIS — F410 Panic disorder [episodic paroxysmal anxiety] without agoraphobia: Secondary | ICD-10-CM | POA: Diagnosis not present

## 2022-06-24 DIAGNOSIS — F411 Generalized anxiety disorder: Secondary | ICD-10-CM | POA: Diagnosis not present

## 2022-06-25 NOTE — Progress Notes (Signed)
[facility] Counselor/Therapist Progress Note  Patient ID: GOLDIA LIGMAN, MRN: [id number],    Date: 06/24/2022  Time Spent: 60 minutes  Treatment Type: Individual Therapy - The patient was seen via video visit.  She gave verbal consent for the session to be on video on web ex.  The patient was in her home alone and therapist was in the office.  Reported Symptoms: anxiety, panic attacks  Mental Status Exam: Appearance:  Casual     Behavior: Appropriate  Motor: Normal  Speech/Language:  Normal Rate  Affect: Blunt  Mood: anxious  Thought process: normal  Thought content:   WNL  Sensory/Perceptual disturbances:   WNL  Orientation: oriented to person, place, time/date, and situation  Attention: Good  Concentration: Good  Memory: WNL  Fund of knowledge:  Good  Insight:   Fair  Judgment:  Good  Impulse Control: Good   Risk Assessment: Danger to Self:  No Self-injurious Behavior: No Danger to Others: No Duty to Warn:no Physical Aggression / Violence:No  Access to Firearms a concern: No  Gang Involvement:No   Subjective: The patient was seen for an individual therapy session via video visit today.  The patient gave verbal permission for the session to be on video on web ex.  The patient was in her home alone and therapist was in the office.  The patient talked about being very anxious today and stressed.  The patient reports that her fianc went to the dentist and he has to have a lot of work done and it was going to cost a lot of money.  The patient has started already obsessing about how they are going to pay for it and she is upset because there is other things that she would like to do.  The patient reports that if she is trying to do the deep breathing some that we had talked about.  The patient has a lot of stress on her right now because she is getting married on October 7.  I recommended that she do the exercise of 3 good things.  I also recommended she  continue doing breathing and mindfulness.  In addition she is not happy with the medication provider that she has and I recommended that she call  [facility] to see if she can get in with one of their providers.  We made some appointments and she is going to implement some of the strategies that we discussed. Interventions: Cognitive Behavioral Therapy and Assertiveness/Communication,, problem solving, psychoeducation,  EMDR as indicated, Meditation and mindfulness  Diagnosis:GAD (generalized anxiety disorder)  Panic disorder  Plan: Client Abilities/Strengths  Intelligent, insightful, motivated  Client Treatment Preferences  Outpatient Individual therapy every other week  Client Statement of Needs  " I feel better now, but still need some help with anxiety"  Treatment Level  Outpatient Individual therapy  Symptoms  Frustration and anxiety related to providing oversight and caretaking to an aging, ailing, and dependent  parent.: No Description Entered (Status: improved). Hypervigilance (e.g., feeling constantly on edge,  experiencing concentration difficulties, having trouble falling or staying asleep, exhibiting a general  state of irritability).: No Description Entered (Status: improved). Motor tension (e.g., restlessness,  tiredness, shakiness, muscle tension).: No Description Entered (Status: improved).  Problems Addressed  Anxiety, Phase Of Life Problems, Anxiety  Goals 1. Learn and implement coping skills that result in a reduction of anxiety  and worry, and improved daily functioning. Objective Learn and implement calming skills to reduce overall anxiety and manage anxiety symptoms.  Target Date: 2024-08-09Frequency: Weekly Progress: 0 Modality: individual  Related Interventions 1. Teach the client calming/relaxation skills (e.g., applied relaxation, progressive muscle  relaxation, cue controlled relaxation; mindful breathing; biofeedback) and how to discriminate  better  between relaxation and tension; teach the client how to apply these skills to his/her daily  life (e.g., New Directions in Progressive Muscle Relaxation by Casper Harrison, and  Hazlett-Stevens; Treating Generalized Anxiety Disorder by Rygh and Amparo Bristol). Objective Identify, challenge, and replace biased, fearful self-talk with positive, realistic, and empowering selftalk. Target Date: 2023-05-27 Frequency: weekly Progress: 0 Modality: individual Related Interventions 1. Explore the client's schema and self-talk that mediate his/her fear response; assist him/her in  challenging the biases; replace the distorted messages with reality-based alternatives and  positive, realistic self-talk that will increase his/her self-confidence in coping with irrational  fears (see Cognitive Therapy of Anxiety Disorders by Alison Stalling). Objective Learn and implement problem-solving strategies for realistically addressing worries. Target Date: 2024-08-09Frequency: weekly Progress: 0 Modality: individual 2. Resolve conflicted feelings and adapt to the new life circumstances. Objective Apply problem-solving skills to current circumstances. Target Date: 2023-05-27 Frequency: weekly Progress: 0 Modality: individual Related Interventions 1. Teach the client problem-resolution skills (e.g., defining the problem clearly, brainstorming  multiple solutions, listing the pros and cons of each solution, seeking input from others,  selecting and implementing a plan of action, evaluating outcome, and readjusting plan as  necessary).   3. Stabilize anxiety level while increasing ability to function on a daily  basis. 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  The patient approved this plan.   Aldin Drees G Kyston Gonce, LCSW                                                Cosmo Tetreault G Handsome Anglin, LCSW

## 2022-07-05 ENCOUNTER — Telehealth: Payer: Self-pay

## 2022-07-05 NOTE — Telephone Encounter (Signed)
LVM for pt to call back to schedule sleep study.  

## 2022-07-06 ENCOUNTER — Other Ambulatory Visit: Payer: Self-pay | Admitting: Primary Care

## 2022-07-06 ENCOUNTER — Ambulatory Visit (INDEPENDENT_AMBULATORY_CARE_PROVIDER_SITE_OTHER): Payer: BC Managed Care – PPO | Admitting: Psychology

## 2022-07-06 DIAGNOSIS — F411 Generalized anxiety disorder: Secondary | ICD-10-CM

## 2022-07-06 DIAGNOSIS — F410 Panic disorder [episodic paroxysmal anxiety] without agoraphobia: Secondary | ICD-10-CM

## 2022-07-06 NOTE — Progress Notes (Signed)
                Faith Jordan Faith [name], LCSW

## 2022-07-13 ENCOUNTER — Telehealth: Payer: Self-pay

## 2022-07-13 ENCOUNTER — Other Ambulatory Visit: Payer: Self-pay | Admitting: Psychiatry

## 2022-07-13 MED ORDER — LORAZEPAM 0.5 MG PO TABS: 0.5000 mg | ORAL_TABLET | Freq: Two times a day (BID) | ORAL | 0 refills | Status: DC | PRN

## 2022-07-13 NOTE — Telephone Encounter (Signed)
pt called left a message that she is going to be flying in a couple weeks and she is so nervious and has a lot of anxiety. is it anything you can give her just to do to get threw the flight.

## 2022-07-13 NOTE — Telephone Encounter (Signed)
Sent mychart message to patient.  [employer] no auth req for either codes.  Ref # [id number]

## 2022-07-13 NOTE — Telephone Encounter (Signed)
spoke with patient she is good on trying the medication. she was also given instruction advised by dr. Modesta Messing about the hydroxyzine holding while taking the lorazepam.

## 2022-07-13 NOTE — Telephone Encounter (Signed)
Please ask her if she is interested in taking lorazepam 0.5 mg daily as needed. The use is limited only for flight. Side effects including drowsiness. Please hold hydroxyzine if she were to use this medication. If she is interested, I will order it to the pharmacy for limited amount.

## 2022-07-13 NOTE — Telephone Encounter (Signed)
Ordered

## 2022-07-14 NOTE — Telephone Encounter (Signed)
Faith Newport, NP

## 2022-07-15 NOTE — Telephone Encounter (Addendum)
New PA submitted via CMM  Key: [id number] Your information has been sent to [facility]. Awaiting determination

## 2022-07-20 ENCOUNTER — Encounter: Payer: Self-pay | Admitting: Primary Care

## 2022-07-20 ENCOUNTER — Ambulatory Visit (INDEPENDENT_AMBULATORY_CARE_PROVIDER_SITE_OTHER): Payer: BC Managed Care – PPO | Admitting: Primary Care

## 2022-07-20 VITALS — BP 120/76 | HR 75 | Temp 97.7°F | Ht 67.0 in | Wt 158.0 lb

## 2022-07-20 DIAGNOSIS — R350 Frequency of micturition: Secondary | ICD-10-CM | POA: Diagnosis not present

## 2022-07-20 DIAGNOSIS — R3915 Urgency of urination: Secondary | ICD-10-CM

## 2022-07-20 LAB — POC URINALSYSI DIPSTICK (AUTOMATED)
Bilirubin, UA: NEGATIVE
Blood, UA: NEGATIVE
Glucose, UA: NEGATIVE
Ketones, UA: NEGATIVE
Nitrite, UA: NEGATIVE
Protein, UA: NEGATIVE
Spec Grav, UA: 1.025 (ref 1.010–1.025)
Urobilinogen, UA: 0.2 E.U./dL
pH, UA: 5 (ref 5.0–8.0)

## 2022-07-20 MED ORDER — NITROFURANTOIN MONOHYD MACRO 100 MG PO CAPS: 100.0000 mg | ORAL_CAPSULE | Freq: Two times a day (BID) | ORAL | 0 refills | Status: AC

## 2022-07-20 NOTE — Patient Instructions (Signed)
Start Macrobid (nitrofurantoin) tablets for urinary tract infection. Take 1 tablet by mouth twice daily for five days.  You will be contacted regarding your referral to Urology.  Please let us know if you have not been contacted within two weeks.   It was a pleasure to see you today!

## 2022-07-20 NOTE — Assessment & Plan Note (Signed)
UA today with 1+ leuks, otherwise negative. Culture sent and pending.  Could be interstitial cystitis vs stress from upcoming wedding, discussed this today. Less likely pyelonephritis.   Given that her symptoms are intense, coupled with her upcoming wedding and UA results, will treat. She agrees and would like to start with treatment ASAP.   Start Macrobid (nitrofurantoin) tablets for urinary tract infection. Take 1 tablet by mouth twice daily for five days. Referral placed for Urology consult given her prior and current symptoms.

## 2022-07-20 NOTE — Progress Notes (Signed)
Subjective:    Patient ID: Faith Jordan, female    DOB: [birth year], 26 y.o.   MRN: [id number]  Urinary Frequency  Associated symptoms include frequency and urgency. Pertinent negatives include no hematuria.    Faith Jordan is a very pleasant 26 y.o. female with a history of cystitis in 2014, dysautonomia, migraines, chronic back pain, GAD, tachycardia who presents today to discuss urinary frequency.  Symptom onset four days ago with urinary urgency with frequency and a sensation of incomplete bladder emptying. She then began to develop pelvic cramping, mid thoracic back pain.   She denies dysuria, hematuria. Her last culture positive urine sample was in 2014. She was last treated for UTI symptoms via E-visit with Macrobid course in August 2023, symptoms did improve after treatment. No culture completed until several days after and while on antibiotic treatment.   She's taken Aleve, applied lidocaine cream without much improvement.   She drinks at least 64 hours of water daily for which she's done for years. She denies caffeine intake, eating spicy/acidic foods. She has been under increased stress as her wedding is this coming weekend.    Review of Systems  Constitutional:  Negative for fever.  Gastrointestinal:  Negative for abdominal pain.  Genitourinary:  Positive for frequency and urgency. Negative for dysuria and hematuria.  Musculoskeletal:  Positive for back pain.         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   Angio-edema 04/27/2016   Angioedema    Aquagenic angio-edema-urticaria    Asthma    no problems /not used recently   Dysautonomia ([place])    Dysrhythmia    sinus tachycardia   Fibroid    right breast, adenoma   Headache(784.0)    Hives 12/22/2015   Pneumonia    hx  6th grade   Vasculitis ([facility])     Social History   Socioeconomic History   Marital status: Significant Other    Spouse name: Not on file    Number of children: Not on file   Years of education: Not on file   Highest education level: Bachelor's degree (e.g., BA, AB, BS)  Occupational History   Not on file  Tobacco Use   Smoking status: Never   Smokeless tobacco: Never  Vaping Use   Vaping Use: Never used  Substance and Sexual Activity   Alcohol use: No   Drug use: No   Sexual activity: Yes  Other Topics Concern   Not on file  Social History Narrative   Lives with fiance   R handed   Caffeine: 1 drink a day   Social Determinants of Health   Financial Resource Strain: Not on file  Food Insecurity: Not on file  Transportation Needs: Not on file  Physical Activity: Not on file  Stress: Not on file  Social Connections: Not on file  Intimate Partner Violence: Not on file    Past Surgical History:  Procedure Laterality Date   ADENOIDECTOMY     BREAST LUMPECTOMY Right    BREAST LUMPECTOMY Right    MASS EXCISION Right 10/07/2014   Procedure: EXCISION OF RIGHT BREAST MASS;  Surgeon: Jackolyn Confer, MD;  Location: [facility];  Service: General;  Laterality: Right;   TONSILLECTOMY AND ADENOIDECTOMY     TOOTH EXTRACTION      Family History  Problem Relation Age of Onset   Depression Mother    Migraines Mother    Narcolepsy  Mother    Hypothyroidism Mother    Myasthenia gravis Maternal Grandfather    Breast cancer Maternal Grandmother 11   Asthma Other    Cancer Other    Epilepsy Other    Allergic rhinitis Neg Hx    Angioedema Neg Hx    Atopy Neg Hx    Eczema Neg Hx    Immunodeficiency Neg Hx    Urticaria Neg Hx     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, blistering peeling skin   Nsaids Dermatitis    Reaction similar to Stephens-Johnson, blistering peeling rash   Sulfa Antibiotics Swelling    Facial swelling    Current Outpatient Medications on File Prior to Visit  Medication Sig Dispense Refill   DULoxetine (CYMBALTA) 30 MG capsule Take 1  capsule (30 mg total) by mouth daily. Total of 90 mg daily. Take along with 60 mg cap 30 capsule 1   DULoxetine (CYMBALTA) 60 MG capsule Take 1 capsule (60 mg total) by mouth daily. for anxiety 90 capsule 0   EMGALITY 120 MG/ML SOAJ INJECT 120 MG INTO THE SKIN EVERY 30 (THIRTY) DAYS. 1 mL 3   gabapentin (NEURONTIN) 100 MG capsule Take 1 capsule (100 mg total) by mouth at bedtime. For anxiety. 90 capsule 0   hydrOXYzine (ATARAX) 10 MG tablet Take 1 tablet (10 mg total) by mouth 3 (three) times daily as needed for anxiety. 30 tablet 0   LORazepam (ATIVAN) 0.5 MG tablet Take 1 tablet (0.5 mg total) by mouth 2 (two) times daily as needed for up to 10 doses (for anxiety. before/during flight only). 10 tablet 0   propranolol (INDERAL) 20 MG tablet TAKE 1 TABLET BY MOUTH EVERY DAY 90 tablet 3   topiramate (TOPAMAX) 50 MG tablet Take 1 tablet by mouth once daily 90 tablet 1   traZODone (DESYREL) 50 MG tablet Take 1 tablet (50 mg total) by mouth at bedtime. For sleep 90 tablet 0   Ubrogepant (UBRELVY) 100 MG TABS Take 100 mg by mouth at onset of migraine. May repeat in 2 hours if needed. Do not exceed 200 mg (2 tablets) in 24 hours. 10 tablet 3   No current facility-administered medications on file prior to visit.    BP 120/76   Pulse 75   Temp 97.7 F (36.5 C) (Temporal)   Ht '5\' 7"'$  (1.702 m)   Wt 158 lb (71.7 kg)   LMP 07/05/2022 (Approximate)   SpO2 100%   BMI 24.75 kg/m  Objective:   Physical Exam Constitutional:      General: She is not in acute distress. Pulmonary:     Effort: Pulmonary effort is normal.  Abdominal:     Palpations: Abdomen is soft.     Tenderness: There is abdominal tenderness in the suprapubic area.           Assessment & Plan:   Problem List Items Addressed This Visit       Other   Urinary frequency - Primary    UA today with 1+ leuks, otherwise negative. Culture sent and pending.  Could be interstitial cystitis vs stress from upcoming wedding,  discussed this today. Less likely pyelonephritis.   Given that her symptoms are intense, coupled with her upcoming wedding and UA results, will treat. She agrees and would like to start with treatment ASAP.   Start Macrobid (nitrofurantoin) tablets for urinary tract infection. Take 1 tablet by mouth twice daily for five  days. Referral placed for Urology consult given her prior and current symptoms.       Relevant Medications   nitrofurantoin, macrocrystal-monohydrate, (MACROBID) 100 MG capsule   Other Relevant Orders   POCT Urinalysis Dipstick (Automated) (Completed)   Urine Culture   Ambulatory referral to Urology   Other Visit Diagnoses     Urinary urgency       Relevant Orders   Ambulatory referral to Urology          Pleas Koch, NP

## 2022-07-21 LAB — URINE CULTURE
MICRO NUMBER:: 14000831
SPECIMEN QUALITY:: ADEQUATE

## 2022-07-22 ENCOUNTER — Telehealth: Payer: Self-pay | Admitting: *Deleted

## 2022-07-22 NOTE — Telephone Encounter (Signed)
Submitted PA on CMM. Waiting on determination from [facility].

## 2022-07-22 NOTE — Telephone Encounter (Signed)
Note from [facility] 07/15/22: "New PA submitted via CMM Key: [id number]. Your information has been sent to [facility]. Awaiting determination"  PA denied. Initiated new PA on CMM. Key: [id number]. Waiting on questions to become available to proceed.

## 2022-07-23 ENCOUNTER — Ambulatory Visit: Payer: BC Managed Care – PPO | Admitting: Primary Care

## 2022-07-26 NOTE — Telephone Encounter (Signed)
PA approved: "Request Reference Number: [id number]. UBRELVY TAB '100MG'$  is approved through 07/23/2023. Your patient may now fill this prescription and it will be covered."

## 2022-08-04 ENCOUNTER — Ambulatory Visit (INDEPENDENT_AMBULATORY_CARE_PROVIDER_SITE_OTHER): Payer: BC Managed Care – PPO | Admitting: Psychology

## 2022-08-04 DIAGNOSIS — F411 Generalized anxiety disorder: Secondary | ICD-10-CM | POA: Diagnosis not present

## 2022-08-04 DIAGNOSIS — F410 Panic disorder [episodic paroxysmal anxiety] without agoraphobia: Secondary | ICD-10-CM | POA: Diagnosis not present

## 2022-08-04 NOTE — Progress Notes (Signed)
[facility] Counselor/Therapist Progress Note  Patient ID: Faith Jordan, MRN: [id number],    Date: 08/04/2022  Time Spent: 60 minutes  Treatment Type: Individual Therapy - The patient was seen via video visit.  She gave verbal consent for the session to be on video on web ex, but we ended up using caregility..  The patient was in her home alone and therapist was in the office.  Reported Symptoms: anxiety, panic attacks  Mental Status Exam: Appearance:  Casual     Behavior: Appropriate  Motor: Normal  Speech/Language:  Normal Rate  Affect: Blunt  Mood: anxious  Thought process: normal  Thought content:   WNL  Sensory/Perceptual disturbances:   WNL  Orientation: oriented to person, place, time/date, and situation  Attention: Good  Concentration: Good  Memory: WNL  Fund of knowledge:  Good  Insight:   Fair  Judgment:  Good  Impulse Control: Good   Risk Assessment: Danger to Self:  No Self-injurious Behavior: No Danger to Others: No Duty to Warn:no Physical Aggression / Violence:No  Access to Firearms a concern: No  Gang Involvement:No   Subjective: The patient was seen for an individual therapy session via video visit today.  The patient gave verbal permission for the session to be on video on web ex.  The patient was in her home alone and therapist was in the office.  The patient presents as pleasant and cooperative but maintains a level of anxiety.  The patient reports that she got married on October 7 and she went on her honeymoon and she is glad to be back home.  The patient does seem a little more really relaxed than she has previously.  I think she was very stressed related to the wedding and the honeymoon.  The patient talked about wanting to learn how to be less anxious and we have referred her to [facility] and she has an appointment at the end of the month to evaluate the possibility of getting on some different medication.  We also talked about  core beliefs and some of the things that she worries and obsesses about seem to be related to some of her upbringing and we talked about some of these patterns that she could have gotten into based on how she was raised by her parents.  The patient understood the concepts discussed and we talked about the need to discuss a little bit more about self talk and how self-talk impacts on her feelings.  Interventions: Cognitive Behavioral Therapy and Assertiveness/Communication,, problem solving, psychoeducation,  EMDR as indicated, Meditation and mindfulness  Diagnosis:GAD (generalized anxiety disorder)  Panic disorder  Plan: Client Abilities/Strengths  Intelligent, insightful, motivated  Client Treatment Preferences  Outpatient Individual therapy every other week  Client Statement of Needs  " I feel better now, but still need some help with anxiety"  Treatment Level  Outpatient Individual therapy  Symptoms  Frustration and anxiety related to providing oversight and caretaking to an aging, ailing, and dependent  parent.: No Description Entered (Status: improved). Hypervigilance (e.g., feeling constantly on edge,  experiencing concentration difficulties, having trouble falling or staying asleep, exhibiting a general  state of irritability).: No Description Entered (Status: improved). Motor tension (e.g., restlessness,  tiredness, shakiness, muscle tension).: No Description Entered (Status: improved).  Problems Addressed  Anxiety, Phase Of Life Problems, Anxiety  Goals 1. Learn and implement coping skills that result in a reduction of anxiety  and worry, and improved daily functioning. Objective Learn and implement calming  skills to reduce overall anxiety and manage anxiety symptoms. Target Date: 2024-08-09Frequency: Weekly Progress: 0 Modality: individual  Related Interventions 1. Teach the client calming/relaxation skills (e.g., applied relaxation, progressive muscle  relaxation, cue  controlled relaxation; mindful breathing; biofeedback) and how to discriminate  better between relaxation and tension; teach the client how to apply these skills to his/her daily  life (e.g., New Directions in Progressive Muscle Relaxation by Casper Harrison, and  Hazlett-Stevens; Treating Generalized Anxiety Disorder by Rygh and Amparo Bristol). Objective Identify, challenge, and replace biased, fearful self-talk with positive, realistic, and empowering selftalk. Target Date: 2023-05-27 Frequency: weekly Progress: 0 Modality: individual Related Interventions 1. Explore the client's schema and self-talk that mediate his/her fear response; assist him/her in  challenging the biases; replace the distorted messages with reality-based alternatives and  positive, realistic self-talk that will increase his/her self-confidence in coping with irrational  fears (see Cognitive Therapy of Anxiety Disorders by Alison Stalling). Objective Learn and implement problem-solving strategies for realistically addressing worries. Target Date: 2024-08-09Frequency: weekly Progress: 0 Modality: individual 2. Resolve conflicted feelings and adapt to the new life circumstances. Objective Apply problem-solving skills to current circumstances. Target Date: 2023-05-27 Frequency: weekly Progress: 0 Modality: individual Related Interventions 1. Teach the client problem-resolution skills (e.g., defining the problem clearly, brainstorming  multiple solutions, listing the pros and cons of each solution, seeking input from others,  selecting and implementing a plan of action, evaluating outcome, and readjusting plan as  necessary).   3. Stabilize anxiety level while increasing ability to function on a daily  basis. Diagnosis  300.02 (Generalized anxiety disorder) - Open - [Signifier: n/a]  Axis  none 309.28 (Adjustment disorder with mixed anxiety and depressed  mood) - Open - [Signifier: n/a]  Adjustment Disorder,   With Anxiety   Conditions For Discharge Achievement of treatment goals and objectives.  The patient approved this plan.   Rhona Fusilier G Khanh Tanori, LCSW

## 2022-08-10 ENCOUNTER — Ambulatory Visit: Payer: BC Managed Care – PPO | Admitting: Psychiatry

## 2022-08-10 ENCOUNTER — Ambulatory Visit (INDEPENDENT_AMBULATORY_CARE_PROVIDER_SITE_OTHER): Payer: BC Managed Care – PPO | Admitting: Psychology

## 2022-08-10 DIAGNOSIS — F411 Generalized anxiety disorder: Secondary | ICD-10-CM

## 2022-08-10 DIAGNOSIS — F429 Obsessive-compulsive disorder, unspecified: Secondary | ICD-10-CM | POA: Diagnosis not present

## 2022-08-10 NOTE — Progress Notes (Signed)
[facility] Counselor/Therapist Progress Note  Patient ID: ZILDA NO, MRN: [id number],    Date: 08/10/2022  Time Spent: 60 minutes  Treatment Type: Individual Therapy - The patient was seen via video visit.  She gave verbal consent for the session to be on video on web ex, but we ended up using caregility..  The patient was in her home alone and therapist was in the office.  Reported Symptoms: anxiety, panic attacks  Mental Status Exam: Appearance:  Casual     Behavior: Appropriate  Motor: Normal  Speech/Language:  Normal Rate  Affect: Blunt  Mood: anxious  Thought process: normal  Thought content:   WNL  Sensory/Perceptual disturbances:   WNL  Orientation: oriented to person, place, time/date, and situation  Attention: Good  Concentration: Good  Memory: WNL  Fund of knowledge:  Good  Insight:   Fair  Judgment:  Good  Impulse Control: Good   Risk Assessment: Danger to Self:  No Self-injurious Behavior: No Danger to Others: No Duty to Warn:no Physical Aggression / Violence:No  Access to Firearms a concern: No  Gang Involvement:No   Subjective: The patient was seen for an individual therapy session via video visit today.  The patient gave verbal permission for the session to be on video on web ex.  The patient was in her home alone and therapist was in the office.  The patient continues to present as anxious.  Today we talked about self talk and how she is creating scenarios that may not even happen in order for her to think she is prepared to handle them.  I confronted the patient today about trying to defend her way of thinking and being negative.  We talked about the need for her to be neutral or positive and I explained to her how the chemicals in her brain work.  The patient continued to argue her point of thinking the way she thinks and I explained to her that we needed to move past what she was doing because she would not be seeing me for therapy  if what was happening was working.  She does have an appointment to go see [facility] on Monday and hopefully they will evaluate her medication at that point in time. Interventions: Cognitive Behavioral Therapy and Assertiveness/Communication,, problem solving, psychoeducation,  EMDR as indicated, Meditation and mindfulness  Diagnosis:GAD (generalized anxiety disorder)  Obsessive-compulsive disorder, unspecified type  Plan: Client Abilities/Strengths  Intelligent, insightful, motivated  Client Treatment Preferences  Outpatient Individual therapy every other week  Client Statement of Needs  " I feel better now, but still need some help with anxiety"  Treatment Level  Outpatient Individual therapy  Symptoms  Frustration and anxiety related to providing oversight and caretaking to an aging, ailing, and dependent  parent.: No Description Entered (Status: improved). Hypervigilance (e.g., feeling constantly on edge,  experiencing concentration difficulties, having trouble falling or staying asleep, exhibiting a general  state of irritability).: No Description Entered (Status: improved). Motor tension (e.g., restlessness,  tiredness, shakiness, muscle tension).: No Description Entered (Status: improved).  Problems Addressed  Anxiety, Phase Of Life Problems, Anxiety  Goals 1. Learn and implement coping skills that result in a reduction of anxiety  and worry, and improved daily functioning. Objective Learn and implement calming skills to reduce overall anxiety and manage anxiety symptoms. Target Date: 2024-08-09Frequency: Weekly Progress: 0 Modality: individual  Related Interventions 1. Teach the client calming/relaxation skills (e.g., applied relaxation, progressive muscle  relaxation, cue controlled relaxation;  mindful breathing; biofeedback) and how to discriminate  better between relaxation and tension; teach the client how to apply these skills to his/her daily  life (e.g., New  Directions in Progressive Muscle Relaxation by Casper Harrison, and  Hazlett-Stevens; Treating Generalized Anxiety Disorder by Rygh and Amparo Bristol). Objective Identify, challenge, and replace biased, fearful self-talk with positive, realistic, and empowering selftalk. Target Date: 2023-05-27 Frequency: weekly Progress: 0 Modality: individual Related Interventions 1. Explore the client's schema and self-talk that mediate his/her fear response; assist him/her in  challenging the biases; replace the distorted messages with reality-based alternatives and  positive, realistic self-talk that will increase his/her self-confidence in coping with irrational  fears (see Cognitive Therapy of Anxiety Disorders by Alison Stalling). Objective Learn and implement problem-solving strategies for realistically addressing worries. Target Date: 2024-08-09Frequency: weekly Progress: 0 Modality: individual 2. Resolve conflicted feelings and adapt to the new life circumstances. Objective Apply problem-solving skills to current circumstances. Target Date: 2023-05-27 Frequency: weekly Progress: 0 Modality: individual Related Interventions 1. Teach the client problem-resolution skills (e.g., defining the problem clearly, brainstorming  multiple solutions, listing the pros and cons of each solution, seeking input from others,  selecting and implementing a plan of action, evaluating outcome, and readjusting plan as  necessary).   3. Stabilize anxiety level while increasing ability to function on a daily  basis. 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  The patient approved this plan.   Darielle Hancher G Lexey Fletes,  LCSW

## 2022-08-15 ENCOUNTER — Ambulatory Visit (INDEPENDENT_AMBULATORY_CARE_PROVIDER_SITE_OTHER): Payer: BC Managed Care – PPO | Admitting: Neurology

## 2022-08-15 DIAGNOSIS — R6889 Other general symptoms and signs: Secondary | ICD-10-CM

## 2022-08-15 DIAGNOSIS — G4719 Other hypersomnia: Secondary | ICD-10-CM

## 2022-08-15 DIAGNOSIS — R0683 Snoring: Secondary | ICD-10-CM | POA: Diagnosis not present

## 2022-08-15 DIAGNOSIS — G478 Other sleep disorders: Secondary | ICD-10-CM

## 2022-08-15 DIAGNOSIS — G4753 Recurrent isolated sleep paralysis: Secondary | ICD-10-CM

## 2022-08-15 DIAGNOSIS — M2619 Other specified anomalies of jaw-cranial base relationship: Secondary | ICD-10-CM

## 2022-08-15 DIAGNOSIS — G9331 Postviral fatigue syndrome: Secondary | ICD-10-CM

## 2022-08-16 ENCOUNTER — Other Ambulatory Visit: Payer: Self-pay | Admitting: Primary Care

## 2022-08-16 ENCOUNTER — Ambulatory Visit (INDEPENDENT_AMBULATORY_CARE_PROVIDER_SITE_OTHER): Payer: BC Managed Care – PPO | Admitting: Adult Health

## 2022-08-16 ENCOUNTER — Encounter: Payer: Self-pay | Admitting: Adult Health

## 2022-08-16 VITALS — BP 129/79 | HR 80 | Ht 67.0 in | Wt 157.0 lb

## 2022-08-16 DIAGNOSIS — F410 Panic disorder [episodic paroxysmal anxiety] without agoraphobia: Secondary | ICD-10-CM

## 2022-08-16 DIAGNOSIS — G479 Sleep disorder, unspecified: Secondary | ICD-10-CM

## 2022-08-16 DIAGNOSIS — F411 Generalized anxiety disorder: Secondary | ICD-10-CM | POA: Diagnosis not present

## 2022-08-16 DIAGNOSIS — F429 Obsessive-compulsive disorder, unspecified: Secondary | ICD-10-CM | POA: Diagnosis not present

## 2022-08-16 MED ORDER — TRAZODONE HCL 50 MG PO TABS: 50.0000 mg | ORAL_TABLET | Freq: Every day | ORAL | 0 refills | Status: DC

## 2022-08-16 MED ORDER — DULOXETINE HCL 60 MG PO CPEP: 60.0000 mg | ORAL_CAPSULE | Freq: Every day | ORAL | 0 refills | Status: DC

## 2022-08-16 MED ORDER — LORAZEPAM 0.5 MG PO TABS: ORAL_TABLET | ORAL | 2 refills | Status: DC

## 2022-08-16 NOTE — Progress Notes (Signed)
[facility] MD/PA/NP Initial Note  08/16/2022 11:24 AM Faith Jordan  MRN:  [id number]  Chief Complaint:   HPI:   Patient seen today for initial psychiatric evaluation.   Referred by therapist - Bambi Cottle.  Describes mood today as "not good". Pleasant. Reports increased tearfulness - "cries very easily". Mood symptoms - reports depression, anxiety, and irritability. Has always suffered with anxiety. Reports panic attacks after stopping Lexapro. Likes to have all her stuff done before she can sit down. Stating "I can't sit down until everything is done". Has to do things in certain orders. Feels like she can't sit down if things aren't done - "I feel over stimulated". Reports obsessive tendencies - worry and rumination. Over thinking over the simplest things. Varying interest and motivation. Taking medications as prescribed.  Energy levels lower. Evaluated in August for narcolepsy. Active, does not have a regular exercise routine. Works full-time. Enjoys some usual interests and activities. Recently married. Family local. Spending time with family. Appetite adequate. Weight stable. Sleeps well most nights. Averages 9 hours of sleep - broken, but wakes up tired. Has vivid dreams - stress - working in her sleep. Focus and concentration stable. Completing tasks. Managing aspects of household. 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 Denies SI or HI.  Denies AH or VH. Denies self harm. Denies substance use.  Previous medication trials:  Cymbalta, Gabapentin, Trazadone, Hydroxyzine, Propranolol, Wellbutrin, Lorazepam and Lexapro, Prozac, Zoloft, Prazosin  Visit Diagnosis:    ICD-10-CM   1. GAD (generalized anxiety disorder)  F41.1 LORazepam (ATIVAN) 0.5 MG tablet    DULoxetine (CYMBALTA) 60 MG capsule    2. Obsessive-compulsive disorder, unspecified type  F42.9     3. Panic disorder  F41.0 LORazepam (ATIVAN) 0.5 MG tablet    4. Sleep disturbance  G47.9  traZODone (DESYREL) 50 MG tablet      Past Psychiatric History: Denies psychiatric hospitalization.   Past Medical History:  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   Angio-edema 04/27/2016   Angioedema    Aquagenic angio-edema-urticaria    Asthma    no problems /not used recently   Dysautonomia ([place])    Dysrhythmia    sinus tachycardia   Fibroid    right breast, adenoma   Headache(784.0)    Hives 12/22/2015   Pneumonia    hx  6th grade   Vasculitis (H[facility])     Past Surgical History:  Procedure Laterality Date   ADENOIDECTOMY     BREAST LUMPECTOMY Right    BREAST LUMPECTOMY Right    MASS EXCISION Right 10/07/2014   Procedure: EXCISION OF RIGHT BREAST MASS;  Surgeon: Jackolyn Confer, MD;  Location: [facility];  Service: General;  Laterality: Right;   TONSILLECTOMY AND ADENOIDECTOMY     TOOTH EXTRACTION      Family Psychiatric History: Denies any family history of mental illness.   Family History:  Family History  Problem Relation Age of Onset   Depression Mother    Migraines Mother    Narcolepsy Mother    Hypothyroidism Mother    Myasthenia gravis Maternal Grandfather    Breast cancer Maternal Grandmother 80   Asthma Other    Cancer Other    Epilepsy Other    Allergic rhinitis Neg Hx    Angioedema Neg Hx    Atopy Neg Hx    Eczema Neg Hx    Immunodeficiency Neg Hx  Urticaria Neg Hx     Social History:  Social History   Socioeconomic History   Marital status: Significant Other    Spouse name: Not on file   Number of children: Not on file   Years of education: Not on file   Highest education level: Bachelor's degree (e.g., BA, AB, BS)  Occupational History   Not on file  Tobacco Use   Smoking status: Never   Smokeless tobacco: Never  Vaping Use   Vaping Use: Never used  Substance and Sexual Activity   Alcohol use: No   Drug use: No   Sexual activity: Yes  Other Topics Concern   Not on file  Social History  Narrative   Lives with fiance   R handed   Caffeine: 1 drink a day   Social Determinants of Health   Financial Resource Strain: Not on file  Food Insecurity: Not on file  Transportation Needs: Not on file  Physical Activity: Not on file  Stress: Not on file  Social Connections: Not on file    Allergies:  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, blistering peeling skin   Nsaids Dermatitis    Reaction similar to Stephens-Johnson, blistering peeling rash   Sulfa Antibiotics Swelling    Facial swelling    Metabolic Disorder Labs: No results found for: "HGBA1C", "MPG" No results found for: "PROLACTIN" Lab Results  Component Value Date   CHOL 200 04/27/2018   TRIG 153.0 (H) 04/27/2018   HDL 57.80 04/27/2018   CHOLHDL 3 04/27/2018   VLDL 30.6 04/27/2018   LDLCALC 111 (H) 04/27/2018   Lab Results  Component Value Date   TSH 0.83 07/30/2021   TSH 1.58 11/06/2019    Therapeutic Level Labs: No results found for: "LITHIUM" No results found for: "VALPROATE" No results found for: "CBMZ"  Current Medications: Current Outpatient Medications  Medication Sig Dispense Refill   DULoxetine (CYMBALTA) 60 MG capsule Take 1 capsule (60 mg total) by mouth daily. for anxiety 90 capsule 0   EMGALITY 120 MG/ML SOAJ INJECT 120 MG INTO THE SKIN EVERY 30 (THIRTY) DAYS. 1 mL 3   LORazepam (ATIVAN) 0.5 MG tablet Take one tablet twice daily as needed for anxiety and panic attacks. 60 tablet 2   propranolol (INDERAL) 20 MG tablet TAKE 1 TABLET BY MOUTH EVERY DAY 90 tablet 3   topiramate (TOPAMAX) 50 MG tablet Take 1 tablet by mouth once daily 90 tablet 1   traZODone (DESYREL) 50 MG tablet Take 1 tablet (50 mg total) by mouth at bedtime. For sleep 90 tablet 0   Ubrogepant (UBRELVY) 100 MG TABS Take 100 mg by mouth at onset of migraine. May repeat in 2 hours if needed. Do not exceed 200 mg (2 tablets) in 24 hours. 10 tablet 3   No  current facility-administered medications for this visit.    Medication Side Effects: none  Orders placed this visit:  No orders of the defined types were placed in this encounter.   Psychiatric Specialty Exam:  Review of Systems  Musculoskeletal:  Negative for gait problem.  Neurological:  Negative for tremors.  Psychiatric/Behavioral:         Please refer to HPI    Blood pressure 129/79, pulse 80, height '5\' 7"'$  (1.702 m), weight 157 lb (71.2 kg), last menstrual period 07/05/2022.Body mass index is 24.59 kg/m.  General Appearance: Casual and Neat  Eye Contact:  Good  Speech:  Clear and  Coherent and Normal Rate  Volume:  Normal  Mood:  Euthymic  Affect:  Appropriate and Congruent  Thought Process:  Coherent and Descriptions of Associations: Intact  Orientation:  Full (Time, Place, and Person)  Thought Content: Logical   Suicidal Thoughts:  No  Homicidal Thoughts:  No  Memory:  WNL  Judgement:  Good  Insight:  Good  Psychomotor Activity:  Normal  Concentration:  Concentration: Good and Attention Span: Good  Recall:  Good  Fund of Knowledge: Good  Language: Good  Assets:  Communication Skills Desire for Improvement Financial Resources/Insurance Housing Intimacy Leisure Time Physical Health Resilience Social Support Talents/Skills Transportation Vocational/Educational  ADL's:  Intact  Cognition: WNL  Prognosis:  Good   Screenings:  GAD-7    Flowsheet Row Office Visit from 03/18/2020 in [place] at [facility]  Total GAD-7 Score 13      PHQ2-9    [place] Office Visit from 06/02/2022 in [facility] at [facility] Visit from 07/30/2021 in [facility] at [facility] from 02/03/2021 in [facility] at [facility] from 04/27/2018 in [place] at [facility]  PHQ-2 Total Score 0 0 0 0  PHQ-9 Total Score 3 0 1 --      Flowsheet Row ED from 05/31/2022 in [facility] at  [place] No Risk       Receiving Psychotherapy: No   Treatment Plan/Recommendations:   Plan:  PDMP reviewed  Add Ativan 0.'5mg'$  BID prn anxiety  Cymbalta '90mg'$  daily - reduce to '60mg'$  daily Trazadone '50mg'$  at hs  Gabapentin '100mg'$  daily  Hydroxyzine - not taking Lorazepam - flight anxiety  Propranolol for tachycardia since age 60  Genesight testing initiated     Time spent with patient was 60 minutes. Greater than 50% of face to face time with patient was spent on counseling and coordination of care.    RTC 4 weeks  Patient advised to contact office with any questions, adverse effects, or acute worsening in signs and symptoms.      Aloha Gell, NP

## 2022-08-20 NOTE — Procedures (Signed)
[facility] POLYSOMNOGRAPHY  INTERPRETATION REPORT   STUDY DATE:  08/15/2022     PATIENT NAME:  Faith Jordan         DATE OF BIRTH:  03-20-1996  PATIENT ID:  [id number]    TYPE OF STUDY:  PSG  READING PHYSICIAN: Larey Seat, MD REFERRED BY: Eliezer Lofts, MD Neurologist : Dr Jaynee Eagles, MD  SCORING TECHNICIAN: Richard Miu, RPSGT   HISTORY:  Faith Jordan  is 26 year-old Female patient who was seen 05-26-2022 and has a chief complaint of non restorative sleep, feeling unrefreshed and fatigued even after 8-10 hours of sleep.  Past medical history : Former Medical illustrator Investment banker, corporate) sleep walker in childhood,  lifelong sleepier than others,  asthma, dysautonomia, chronic migraines and  vasculitis, dysrhythmia.  ADDITIONAL INFORMATION:  The Epworth Sleepiness Scale was endorsed at a 14 /24 points (scores above or equal to 10 are suggestive of hypersomnolence). FSS was endorsed at 51/63 points. Anxiety, panic disorder, depression.  Height: 67 in Weight: 157 lbs (BMI 24) Neck Size: 13" MEDICATIONS: Cymbalta, Ajovy, Neurontin, Atarax, Inderal, Topamax, Trazodone, Roselyn Meier  TECHNICAL DESCRIPTION: A registered sleep technologist ( RPSGT)  was in attendance for the duration of the recording.  Data collection, scoring, video monitoring, and reporting were performed in compliance with the AASM Manual for the Scoring of Sleep and Associated Events; (Hypopnea is scored based on the criteria listed in Section VIII D. 1b in the AASM Manual V2.6 V2.6 using 3% oxygen desaturation and /or arousal rule).   SLEEP CONTINUITY AND SLEEP ARCHITECTURE:  Lights-out was at 20:36: and lights-on at  05:20:, with  8.7 hours of recording time . Total sleep time (TST) was 482.5 minutes with a normal sleep efficiency at 92.1%.   Sleep latency was normal at 8.5 minutes.  REM sleep latency was increased at 316.5 minutes. Of the total sleep time, the percentage of stage N1 sleep was 1.6%, stage N2  sleep was 60%, stage N3 sleep was 22.7%, and REM sleep was 15.6%. Wake after sleep onset (WASO) time accounted for 33 minutes.    There were 2 Stage R periods observed on this study night, 18 awakenings (i.e. transitions to Stage W from any sleep stage), and 64 total stage transitions. BODY POSITION:  TST was divided between the following sleep positions: supine sleep was present for 167 minutes (35%), non-supine sleep 316 minutes (65%); right sided sleep for 213 minutes (44%), left 102 minutes (21%), and prone 00 minutes (0%). Total supine REM sleep time was 45 minutes (60% of total REM sleep).  RESPIRATORY MONITORING: Based on AASM criteria (using a 3% oxygen desaturation and /or arousal rule for scoring hypopneas), there were 0 apneas (0 obstructive; 0 central; 0 mixed), and 9 hypopneas. Apnea index was 0.0. Hypopnea index was 1.1. The apnea-hypopnea index was 1.1 overall (2.2 supine, 0 non-supine; 0.8 REM, 1.3 supine REM). There were 0 respiratory effort-related arousals (RERAs).  No snoring when in lateral sleep position. OXIMETRY: Oxyhemoglobin Saturation Nadir during sleep was at  93%) from a mean of 98%.  Of the Total sleep time (TST) hypoxemia (<89%) was present for 0.0 minutes, or 0.0% of total sleep time.  LIMB MOVEMENTS: There were 6 periodic limb movements of sleep (0.7/h), of which 0 (0.0/h) were associated with an arousal. AROUSAL: There were 74 arousals in total, for an arousal index of 7 arousals/hour.  Of these, 8 were identified as respiratory-related arousals (1 /h), 0 were PLM-related arousals (0 /h), and  59 were non-specific arousals (7 /h). EEG:  PSG EEG was of normal amplitude and frequency, with symmetric manifestation of sleep stages. EKG: The electrocardiogram documented NSR.  The average heart rate during sleep was 70 bpm. The heart rate during sleep varied between a minimum of 59 bpm and  a maximum of  92 bpm. AUDIO and VIDEO: The patient changed sleep positions frequently  without sleep interruption. There were some microarousals during REM sleep noted, but no PLMs and no prolonged periods of wakefulness.   IMPRESSION:  A clinically significant degree of sleep disordered breathing was not found. There were no apnea events recorded, nor oxygen desaturation and only mild snoring ( in supine position). The heart rate and rhythm was normal sinus rhythm without brady- or tachycardia.     3) Periodic limb movements were rare and did not cause arousals.  4) Total sleep time was within normal limits at 482.5 minutes.  Sleep efficiency was normal at 92.1%.   moist arousals were 'spontaneous' which means unrelated to physiological functions such as measured in a PSG. Pain can be the cause of such arousals.   RECOMMENDATIONS: No organic sleep disorder can be identified. Sleep architecture was essentially normal by proportion of sleep stages and their chronological appearance. Follow up is optional- I recommend to follow up with counseling and pain therapy.   Larey Seat, MD

## 2022-08-23 ENCOUNTER — Encounter: Payer: Self-pay | Admitting: Neurology

## 2022-08-23 ENCOUNTER — Telehealth: Payer: Self-pay | Admitting: Adult Health

## 2022-08-23 NOTE — Telephone Encounter (Signed)
Patient lvm stating that she decreased her Cymbalta prescription from '90mg'$  to '60mg'$  about a week ago and she has been experienceing bad side effects. She took '90mg'$  twice over the weekend. She would like a rtc to discuss. Ph: [phone] Appt 11/28

## 2022-08-24 NOTE — Telephone Encounter (Signed)
LVM to rtc 

## 2022-08-25 NOTE — Telephone Encounter (Signed)
Noted. Ty!

## 2022-08-25 NOTE — Telephone Encounter (Signed)
Pt stated when she decreased to 60 mg she had headaches and became emotional/irritable.It has gotten better now but she just wanted you to be aware

## 2022-08-27 ENCOUNTER — Ambulatory Visit (INDEPENDENT_AMBULATORY_CARE_PROVIDER_SITE_OTHER): Payer: BC Managed Care – PPO | Admitting: Psychology

## 2022-08-27 DIAGNOSIS — F429 Obsessive-compulsive disorder, unspecified: Secondary | ICD-10-CM | POA: Diagnosis not present

## 2022-08-27 DIAGNOSIS — F411 Generalized anxiety disorder: Secondary | ICD-10-CM

## 2022-08-29 NOTE — Progress Notes (Signed)
[facility] Counselor/Therapist Progress Note  Patient ID: Faith Jordan, MRN: [id number],    Date: 08/27/2022  Time Spent: 60 minutes  Treatment Type: Individual Therapy - The patient was seen via video visit.  She gave verbal consent for the session to be on video on web ex, but we ended up using caregility..  The patient was in her home alone and therapist was in the office.  Reported Symptoms: anxiety, panic attacks  Mental Status Exam: Appearance:  Casual     Behavior: Appropriate  Motor: Normal  Speech/Language:  Normal Rate  Affect: Blunt  Mood: Irritable  Thought process: normal  Thought content:   WNL  Sensory/Perceptual disturbances:   WNL  Orientation: oriented to person, place, time/date, and situation  Attention: Good  Concentration: Good  Memory: WNL  Fund of knowledge:  Good  Insight:   Fair  Judgment:  Good  Impulse Control: Good   Risk Assessment: Danger to Self:  No Self-injurious Behavior: No Danger to Others: No Duty to Warn:no Physical Aggression / Violence:No  Access to Firearms a concern: No  Gang Involvement:No   Subjective: The patient was seen for an individual therapy session via video visit today.  The patient gave verbal permission for the session to be on video on web ex.  The patient was in her home alone and therapist was in the office.  The patient reports that she is having some issues with irritability this week.  She did go to see Deloria Lair, NP and her medications were changed.  The patient states that she is taking her off of the cymbalta and put her on xanax.  The patient states that she also is having her menstrual cycle.  We talked about her possibly going through withdrawal symptoms and that she needs to take the medications that Dr. Dwaine Gale prescribed as it is written because she has not been taking the xanax like it is prescribed.  I also recommended that she contact Gina on Monday and make sure that she wants  her to take it regularly.   Interventions: Cognitive Behavioral Therapy and Assertiveness/Communication,, problem solving, psychoeducation,  EMDR as indicated, Meditation and mindfulness  Diagnosis:GAD (generalized anxiety disorder)  Obsessive-compulsive disorder, unspecified type  Plan: Client Abilities/Strengths  Intelligent, insightful, motivated  Client Treatment Preferences  Outpatient Individual therapy every other week  Client Statement of Needs  " I feel better now, but still need some help with anxiety"  Treatment Level  Outpatient Individual therapy  Symptoms  Frustration and anxiety related to providing oversight and caretaking to an aging, ailing, and dependent  parent.: No Description Entered (Status: improved). Hypervigilance (e.g., feeling constantly on edge,  experiencing concentration difficulties, having trouble falling or staying asleep, exhibiting a general  state of irritability).: No Description Entered (Status: improved). Motor tension (e.g., restlessness,  tiredness, shakiness, muscle tension).: No Description Entered (Status: improved).  Problems Addressed  Anxiety, Phase Of Life Problems, Anxiety  Goals 1. Learn and implement coping skills that result in a reduction of anxiety  and worry, and improved daily functioning. Objective Learn and implement calming skills to reduce overall anxiety and manage anxiety symptoms. Target Date: 2024-08-09Frequency: Weekly Progress: 0 Modality: individual  Related Interventions 1. Teach the client calming/relaxation skills (e.g., applied relaxation, progressive muscle  relaxation, cue controlled relaxation; mindful breathing; biofeedback) and how to discriminate  better between relaxation and tension; teach the client how to apply these skills to his/her daily  life (e.g., New Directions in Progressive  Muscle Relaxation by Casper Harrison, and  Hazlett-Stevens; Treating Generalized Anxiety Disorder by Rygh and  Amparo Bristol). Objective Identify, challenge, and replace biased, fearful self-talk with positive, realistic, and empowering selftalk. Target Date: 2023-05-27 Frequency: weekly Progress: 0 Modality: individual Related Interventions 1. Explore the client's schema and self-talk that mediate his/her fear response; assist him/her in  challenging the biases; replace the distorted messages with reality-based alternatives and  positive, realistic self-talk that will increase his/her self-confidence in coping with irrational  fears (see Cognitive Therapy of Anxiety Disorders by Alison Stalling). Objective Learn and implement problem-solving strategies for realistically addressing worries. Target Date: 2024-08-09Frequency: weekly Progress: 0 Modality: individual 2. Resolve conflicted feelings and adapt to the new life circumstances. Objective Apply problem-solving skills to current circumstances. Target Date: 2023-05-27 Frequency: weekly Progress: 0 Modality: individual Related Interventions 1. Teach the client problem-resolution skills (e.g., defining the problem clearly, brainstorming  multiple solutions, listing the pros and cons of each solution, seeking input from others,  selecting and implementing a plan of action, evaluating outcome, and readjusting plan as  necessary).   3. Stabilize anxiety level while increasing ability to function on a daily  basis. 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  The patient approved this plan.   Omir Cooprider G Foye Haggart, LCSW

## 2022-09-06 ENCOUNTER — Other Ambulatory Visit: Payer: Self-pay | Admitting: Adult Health

## 2022-09-06 ENCOUNTER — Telehealth: Payer: Self-pay | Admitting: Adult Health

## 2022-09-06 DIAGNOSIS — F410 Panic disorder [episodic paroxysmal anxiety] without agoraphobia: Secondary | ICD-10-CM

## 2022-09-06 DIAGNOSIS — F411 Generalized anxiety disorder: Secondary | ICD-10-CM

## 2022-09-06 MED ORDER — DULOXETINE HCL 30 MG PO CPEP: 30.0000 mg | ORAL_CAPSULE | Freq: Every day | ORAL | 2 refills | Status: DC

## 2022-09-06 NOTE — Telephone Encounter (Signed)
PT lvm that she has decreased the cymbalta and she is not doing well.. Please call her at [phone]

## 2022-09-06 NOTE — Telephone Encounter (Signed)
Pt stated Cymbalta was decreased to 60 mg 3 weeks ago and she is not doing well.She is very emotional and crying a lot.She is having mood changes and irritable.She had to leave work early due to crying and mood swings.Her anxiety has also increased,she feels out of breath like she is having a panic attack.She has an appt next week but asking for a resolution before then.

## 2022-09-07 ENCOUNTER — Ambulatory Visit (INDEPENDENT_AMBULATORY_CARE_PROVIDER_SITE_OTHER): Payer: BC Managed Care – PPO | Admitting: Psychology

## 2022-09-07 DIAGNOSIS — F429 Obsessive-compulsive disorder, unspecified: Secondary | ICD-10-CM | POA: Diagnosis not present

## 2022-09-07 DIAGNOSIS — F411 Generalized anxiety disorder: Secondary | ICD-10-CM

## 2022-09-08 ENCOUNTER — Ambulatory Visit: Payer: BC Managed Care – PPO | Admitting: Psychology

## 2022-09-08 ENCOUNTER — Encounter: Payer: Self-pay | Admitting: Urology

## 2022-09-08 ENCOUNTER — Ambulatory Visit (INDEPENDENT_AMBULATORY_CARE_PROVIDER_SITE_OTHER): Payer: BC Managed Care – PPO | Admitting: Urology

## 2022-09-08 VITALS — BP 121/80 | HR 77 | Ht 67.0 in | Wt 155.0 lb

## 2022-09-08 DIAGNOSIS — R350 Frequency of micturition: Secondary | ICD-10-CM

## 2022-09-08 DIAGNOSIS — R3915 Urgency of urination: Secondary | ICD-10-CM | POA: Diagnosis not present

## 2022-09-08 LAB — MICROSCOPIC EXAMINATION

## 2022-09-08 LAB — URINALYSIS, COMPLETE
Bilirubin, UA: NEGATIVE
Glucose, UA: NEGATIVE
Ketones, UA: NEGATIVE
Nitrite, UA: NEGATIVE
Protein,UA: NEGATIVE
RBC, UA: NEGATIVE
Specific Gravity, UA: 1.02 (ref 1.005–1.030)
Urobilinogen, Ur: 1 mg/dL (ref 0.2–1.0)
pH, UA: 6 (ref 5.0–7.5)

## 2022-09-08 LAB — BLADDER SCAN AMB NON-IMAGING: Scan Result: 0

## 2022-09-08 MED ORDER — OXYBUTYNIN CHLORIDE 5 MG PO TABS: 5.0000 mg | ORAL_TABLET | Freq: Three times a day (TID) | ORAL | 0 refills | Status: DC | PRN

## 2022-09-08 NOTE — Progress Notes (Signed)
09/08/2022 8:34 AM   [name] July 30, 1996 [id number]  Referring provider: Pleas Koch, NP [place],  [place] [address]  Chief Complaint  Patient presents with   Urinary Frequency    HPI: Faith Jordan is a 26 y.o. female referred for evaluation of urinary frequency.  Was seen by PCP 07/20/2022 with complaints of urinary frequency, urgency with sensation of incomplete bladder emptying.  She also had pelvic cramping and bilateral flank pain. Similar symptoms August 2023 and treated with a course of Macrobid after a video visit. Insignificant growth on urine cultures performed August 2023 x 2 and October 2023 Denies gross hematuria Or dysuria and flank pain have resolved.  Still having intermittent urinary frequency and urgency   PMH: 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   Angio-edema 04/27/2016   Angioedema    Aquagenic angio-edema-urticaria    Asthma    no problems /not used recently   Dysautonomia ([place])    Dysrhythmia    sinus tachycardia   Fibroid    right breast, adenoma   Headache(784.0)    Hives 12/22/2015   Pneumonia    hx  6th grade   Vasculitis ([place])     Surgical History: Past Surgical History:  Procedure Laterality Date   ADENOIDECTOMY     BREAST LUMPECTOMY Right    BREAST LUMPECTOMY Right    MASS EXCISION Right 10/07/2014   Procedure: EXCISION OF RIGHT BREAST MASS;  Surgeon: Jackolyn Confer, MD;  Location: [facility];  Service: General;  Laterality: Right;   TONSILLECTOMY AND ADENOIDECTOMY     TOOTH EXTRACTION      Home Medications:  Allergies as of 09/08/2022       Reactions   Advil [ibuprofen] Dermatitis   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 If you have any questions, ask your nurse or doctor.          DULoxetine 60 MG capsule Commonly known as: CYMBALTA Take 1 capsule (60 mg total) by mouth daily. for anxiety   DULoxetine 30 MG capsule Commonly known as: Cymbalta Take 1 capsule (30 mg total) by mouth daily.   Emgality 120 MG/ML Soaj Generic drug: Galcanezumab-gnlm INJECT 120 MG INTO THE SKIN EVERY 30 (THIRTY) DAYS.   LORazepam 0.5 MG tablet Commonly known as: ATIVAN Take one tablet twice daily as needed for anxiety and panic attacks.   oxybutynin 5 MG tablet Commonly known as: DITROPAN Take 1 tablet (5 mg total) by mouth every 8 (eight) hours as needed for bladder spasms. Started by: Abbie Sons, MD   propranolol 20 MG tablet Commonly known as: INDERAL TAKE 1 TABLET BY MOUTH EVERY DAY   topiramate 50 MG tablet Commonly known as: TOPAMAX Take 1 tablet by mouth once daily   traZODone 50 MG tablet Commonly known as: DESYREL Take 1 tablet (50 mg total) by mouth at bedtime. For sleep   Ubrelvy 100 MG Tabs Generic drug: Ubrogepant Take 100 mg by mouth at onset of migraine. May repeat in 2 hours if needed. Do not exceed 200 mg (2 tablets) in 24 hours.        Allergies:  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, blistering peeling skin   Nsaids Dermatitis    Reaction similar to Stephens-Johnson, blistering peeling rash   Sulfa Antibiotics Swelling    Facial swelling    Family History: Family History  Problem Relation Age of Onset   Depression Mother    Migraines Mother    Narcolepsy Mother    Hypothyroidism Mother    Myasthenia gravis Maternal Grandfather    Breast cancer Maternal Grandmother 46   Asthma Other    Cancer Other    Epilepsy Other    Allergic rhinitis Neg Hx    Angioedema Neg Hx    Atopy Neg Hx    Eczema Neg Hx    Immunodeficiency Neg Hx    Urticaria Neg Hx     Social History:  reports that she has never  smoked. She has never used smokeless tobacco. She reports that she does not drink alcohol and does not use drugs.   Physical Exam: BP 121/80   Pulse 77   Ht '5\' 7"'$  (1.702 m)   Wt 155 lb (70.3 kg)   BMI 24.28 kg/m   Constitutional:  Alert and oriented, No acute distress. HEENT: [place] AT Respiratory: Normal respiratory effort, no increased work of breathing. Psychiatric: Normal mood and affect.  Laboratory Data:  Urinalysis Dipstick 1+ leukocyte Microscopy 6-10 WBC   Assessment & Plan:    History of storage related voiding symptoms with dysuria and pelvic pain with negative UA/culture Currently with intermittent voiding symptoms We discussed potential etiologies including overactive bladder and IC/painful bladder syndrome UA today with mild pyuria and urine culture ordered Provided literature on IC smart diet to see if she can identify any dietary triggers Since her symptoms are intermittent presently trial oxybutynin IR 5 mg 3 times daily as needed Follow-up 1 month symptom recheck and instructed call earlier for any worsening symptoms   Abbie Sons, MD  [facility] [address], [place], [place] [address] [phone]

## 2022-09-08 NOTE — Progress Notes (Signed)
[facility] Counselor/Therapist Progress Note  Patient ID: Faith Jordan, MRN: [id number],    Date: 09/07/2022  Time Spent: 60 minutes  Treatment Type: Individual Therapy - The patient was seen via video visit.  She gave verbal consent for the session to be on video on web ex, but we ended up using caregility..  The patient was in her home alone and therapist was in the office.  Reported Symptoms: anxiety, panic attacks  Mental Status Exam: Appearance:  Casual     Behavior: Appropriate  Motor: Normal  Speech/Language:  Normal Rate  Affect: Blunt  Mood: Irritable  Thought process: normal  Thought content:   WNL  Sensory/Perceptual disturbances:   WNL  Orientation: oriented to person, place, time/date, and situation  Attention: Good  Concentration: Good  Memory: WNL  Fund of knowledge:  Good  Insight:   Fair  Judgment:  Good  Impulse Control: Good   Risk Assessment: Danger to Self:  No Self-injurious Behavior: No Danger to Others: No Duty to Warn:no Physical Aggression / Violence:No  Access to Firearms a concern: No  Gang Involvement:No   Subjective: The patient was seen for an individual therapy session via video visit today.  The patient gave verbal permission for the session to be on video on web ex.  The patient was in her home alone and therapist was in the office.  The patient had called earlier and asked for an earlier appointment this week.  The patient reports that she has been really struggling with her anxiety over the last week and her irritability.  The patient reports that some of this irritability is related to her sister and the dynamics that are going on in her family of origin.  She reports that she ended up having to leave work early because she was so emotionally distraught after finding out that her sister was saying she was suicidal again.  The patient also has had trouble because she has been coming off of Cymbalta and it is possible that  she could be having some withdrawal symptoms however I think the trigger has been that her sister and family have been having issues.  I spoke with the patient about managing her own emotions and how to detach from what is going on with her family of origin.  I explained to her that if she was unhappy about what was happening that she should likely leave any family gathering her sister is if it is causing her that much distress.  We will continue to work with the patient on trying to detach from family dynamics once we get her stabilized on the right medication.   Interventions: Cognitive Behavioral Therapy and Assertiveness/Communication,, problem solving, psychoeducation,  EMDR as indicated, Meditation and mindfulness  Diagnosis:GAD (generalized anxiety disorder)  Obsessive-compulsive disorder, unspecified type  Plan: Client Abilities/Strengths  Intelligent, insightful, motivated  Client Treatment Preferences  Outpatient Individual therapy every other week  Client Statement of Needs  " I feel better now, but still need some help with anxiety"  Treatment Level  Outpatient Individual therapy  Symptoms  Frustration and anxiety related to providing oversight and caretaking to an aging, ailing, and dependent  parent.: No Description Entered (Status: improved). Hypervigilance (e.g., feeling constantly on edge,  experiencing concentration difficulties, having trouble falling or staying asleep, exhibiting a general  state of irritability).: No Description Entered (Status: improved). Motor tension (e.g., restlessness,  tiredness, shakiness, muscle tension).: No Description Entered (Status: improved).  Problems Addressed  Anxiety, Phase Of  Life Problems, Anxiety  Goals 1. Learn and implement coping skills that result in a reduction of anxiety  and worry, and improved daily functioning. Objective Learn and implement calming skills to reduce overall anxiety and manage anxiety symptoms. Target  Date: 2024-08-09Frequency: Weekly Progress: 0 Modality: individual  Related Interventions 1. Teach the client calming/relaxation skills (e.g., applied relaxation, progressive muscle  relaxation, cue controlled relaxation; mindful breathing; biofeedback) and how to discriminate  better between relaxation and tension; teach the client how to apply these skills to his/her daily  life (e.g., New Directions in Progressive Muscle Relaxation by Casper Harrison, and  Hazlett-Stevens; Treating Generalized Anxiety Disorder by Rygh and Amparo Bristol). Objective Identify, challenge, and replace biased, fearful self-talk with positive, realistic, and empowering selftalk. Target Date: 2023-05-27 Frequency: weekly Progress: 0 Modality: individual Related Interventions 1. Explore the client's schema and self-talk that mediate his/her fear response; assist him/her in  challenging the biases; replace the distorted messages with reality-based alternatives and  positive, realistic self-talk that will increase his/her self-confidence in coping with irrational  fears (see Cognitive Therapy of Anxiety Disorders by Alison Stalling). Objective Learn and implement problem-solving strategies for realistically addressing worries. Target Date: 2024-08-09Frequency: weekly Progress: 0 Modality: individual 2. Resolve conflicted feelings and adapt to the new life circumstances. Objective Apply problem-solving skills to current circumstances. Target Date: 2023-05-27 Frequency: weekly Progress: 0 Modality: individual Related Interventions 1. Teach the client problem-resolution skills (e.g., defining the problem clearly, brainstorming  multiple solutions, listing the pros and cons of each solution, seeking input from others,  selecting and implementing a plan of action, evaluating outcome, and readjusting plan as  necessary).   3. Stabilize anxiety level while increasing ability to function on a daily   basis. 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  The patient approved this plan.   Juliah Scadden G Alyona Romack, LCSW

## 2022-09-11 LAB — CULTURE, URINE COMPREHENSIVE

## 2022-09-13 ENCOUNTER — Encounter: Payer: Self-pay | Admitting: *Deleted

## 2022-09-14 ENCOUNTER — Encounter: Payer: Self-pay | Admitting: Adult Health

## 2022-09-14 ENCOUNTER — Ambulatory Visit (INDEPENDENT_AMBULATORY_CARE_PROVIDER_SITE_OTHER): Payer: BC Managed Care – PPO | Admitting: Adult Health

## 2022-09-14 ENCOUNTER — Ambulatory Visit (INDEPENDENT_AMBULATORY_CARE_PROVIDER_SITE_OTHER): Payer: BC Managed Care – PPO | Admitting: Psychology

## 2022-09-14 DIAGNOSIS — F411 Generalized anxiety disorder: Secondary | ICD-10-CM | POA: Diagnosis not present

## 2022-09-14 DIAGNOSIS — G479 Sleep disorder, unspecified: Secondary | ICD-10-CM

## 2022-09-14 DIAGNOSIS — F429 Obsessive-compulsive disorder, unspecified: Secondary | ICD-10-CM

## 2022-09-14 DIAGNOSIS — F410 Panic disorder [episodic paroxysmal anxiety] without agoraphobia: Secondary | ICD-10-CM | POA: Diagnosis not present

## 2022-09-14 MED ORDER — FLUOXETINE HCL 10 MG PO CAPS: 10.0000 mg | ORAL_CAPSULE | Freq: Every day | ORAL | 2 refills | Status: DC

## 2022-09-14 NOTE — Progress Notes (Signed)
Faith Jordan [id number] [birth year] 26 y.o.  Subjective:   Patient ID:  Faith Jordan is a 26 y.o. (DOB 1995/11/11) female.  Chief Complaint: No chief complaint on file.   HPI Faith Jordan presents to the office today for follow-up of GAD, OCD, panic disorder, and insomnia.  Referred by therapist - Bambi Cottle.  Describes mood today as "better than last week". Pleasant. Tearful at times. Mood symptoms - reports decreased depression, anxiety, and irritability this week compared to the previous week when the Cymbalta was decreased. Reports panic attacks - "last week". Reports worry, rumination, and obsessive thoughts and acts. Reports over thinking. Mood has improved over past week. Reports she was unable to tolerate decrease in Cymbalta - willing to try a second attempt. Also agreeable to try a low dose of Prozac to help with the possible withdrawal and potentially continue to help manage current mood symptoms. Improved interest and motivation. Taking medications as prescribed.  Energy levels improved from last week. Evaluated in August for narcolepsy. Active, does not have a regular exercise routine.   Enjoys some usual interests and activities. Recently married. Family local. Spending time with family. Appetite decreased. Weight stable. Sleeps better some nights than others. Averages 8 to 10 hours of sleep - "broken". Focus and concentration improved from last week. Completing tasks. Managing aspects of household. 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 Denies SI or HI.  Denies AH or VH. Denies self harm. Denies substance use.  Previous medication trials:  Cymbalta, Gabapentin, Trazadone, Hydroxyzine, Propranolol, Wellbutrin, Lorazepam and Lexapro, Prozac, Zoloft, Prazosin, Effexor  GAD-7    Flowsheet Row Office Visit from 03/18/2020 in [place] at [facility]  Total GAD-7 Score 13      PHQ2-9    [place] Office Visit from 06/02/2022 in  [place] at [facility] Visit from 07/30/2021 in [place] at [facility] Visit from 02/03/2021 in [place] at [facility] Visit from 04/27/2018 in [place] at [facility] Total Score 0 0 0 0  PHQ-9 Total Score 3 0 1 --      [place] ED from 05/31/2022 in [facility] at [place] No Risk        Review of Systems:  Review of Systems  Musculoskeletal:  Negative for gait problem.  Neurological:  Negative for tremors.  Psychiatric/Behavioral:         Please refer to HPI    Medications: I have reviewed the patient's current medications.  Current Outpatient Medications  Medication Sig Dispense Refill   FLUoxetine (PROZAC) 10 MG capsule Take 1 capsule (10 mg total) by mouth daily. 30 capsule 2   DULoxetine (CYMBALTA) 60 MG capsule Take 1 capsule (60 mg total) by mouth daily. for anxiety 90 capsule 0   EMGALITY 120 MG/ML SOAJ INJECT 120 MG INTO THE SKIN EVERY 30 (THIRTY) DAYS. 1 mL 3   LORazepam (ATIVAN) 0.5 MG tablet Take one tablet twice daily as needed for anxiety and panic attacks. 60 tablet 2   oxybutynin (DITROPAN) 5 MG tablet Take 1 tablet (5 mg total) by mouth every 8 (eight) hours as needed for bladder spasms. 30 tablet 0   propranolol (INDERAL) 20 MG tablet TAKE 1 TABLET BY MOUTH EVERY DAY 90 tablet 3   topiramate (TOPAMAX) 50 MG tablet Take 1 tablet by mouth once daily 90 tablet 1   traZODone (DESYREL)  50 MG tablet Take 1 tablet (50 mg total) by mouth at bedtime. For sleep 90 tablet 0   Ubrogepant (UBRELVY) 100 MG TABS Take 100 mg by mouth at onset of migraine. May repeat in 2 hours if needed. Do not exceed 200 mg (2 tablets) in 24 hours. 10 tablet 3   No current facility-administered medications for this visit.    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,  blistering peeling skin   Nsaids Dermatitis    Reaction similar to Stephens-Johnson, blistering peeling rash   Sulfa Antibiotics Swelling    Facial swelling    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   Angio-edema 04/27/2016   Angioedema    Aquagenic angio-edema-urticaria    Asthma    no problems /not used recently   Dysautonomia ([place])    Dysrhythmia    sinus tachycardia   Fibroid    right breast, adenoma   Headache(784.0)    Hives 12/22/2015   Pneumonia    hx  6th grade   Vasculitis (HCC)     Past Medical History, Surgical history, Social history, and Family history were reviewed and updated as appropriate.   Please see review of systems for further details on the patient's review from today.   Objective:   Physical Exam:  There were no vitals taken for this visit.  Physical Exam Constitutional:      General: She is not in acute distress. Musculoskeletal:        General: No deformity.  Neurological:     Mental Status: She is alert and oriented to person, place, and time.     Coordination: Coordination normal.  Psychiatric:        Attention and Perception: Attention and perception normal. She does not perceive auditory or visual hallucinations.        Mood and Affect: Mood normal. Mood is not anxious or depressed. Affect is not labile, blunt, angry or inappropriate.        Speech: Speech normal.        Behavior: Behavior normal.        Thought Content: Thought content normal. Thought content is not paranoid or delusional. Thought content does not include homicidal or suicidal ideation. Thought content does not include homicidal or suicidal plan.        Cognition and Memory: Cognition and memory normal.        Judgment: Judgment normal.     Comments: Insight intact     Lab Review:     Component Value Date/Time   NA 133 (L) 06/02/2022 0924   NA 142 09/28/2017 1459   K 4.2 06/02/2022 0924   CL 106 06/02/2022 0924    CO2 22 06/02/2022 0924   GLUCOSE 87 06/02/2022 0924   BUN 12 06/02/2022 0924   BUN 3 (L) 09/28/2017 1459   CREATININE 0.81 06/02/2022 0924   CALCIUM 9.7 06/02/2022 0924   PROT 6.7 11/06/2019 0855   PROT 6.5 09/28/2017 1459   ALBUMIN 4.2 11/06/2019 0855   ALBUMIN 4.0 09/28/2017 1459   AST 11 11/06/2019 0855   ALT 9 11/06/2019 0855   ALKPHOS 43 11/06/2019 0855   BILITOT 0.5 11/06/2019 0855   BILITOT 0.2 09/28/2017 1459   GFRNONAA >60 11/21/2020 1909   GFRAA >60 12/01/2017 1018       Component Value Date/Time   WBC  9.7 06/02/2022 0924   RBC 5.20 (H) 06/02/2022 0924   HGB 15.5 (H) 06/02/2022 0924   HGB 11.8 09/28/2017 1459   HCT 45.7 06/02/2022 0924   HCT 36.7 09/28/2017 1459   PLT 348.0 06/02/2022 0924   PLT 368 09/28/2017 1459   MCV 87.9 06/02/2022 0924   MCV 88 09/28/2017 1459   MCH 30.0 11/21/2020 1909   MCHC 33.9 06/02/2022 0924   RDW 12.8 06/02/2022 0924   RDW 13.1 09/28/2017 1459   LYMPHSABS 2.8 06/02/2022 0924   LYMPHSABS 2.8 09/28/2017 1459   MONOABS 0.8 06/02/2022 0924   EOSABS 0.1 06/02/2022 0924   EOSABS 0.0 09/28/2017 1459   BASOSABS 0.1 06/02/2022 0924   BASOSABS 0.0 09/28/2017 1459    No results found for: "POCLITH", "LITHIUM"   No results found for: "PHENYTOIN", "PHENOBARB", "VALPROATE", "CBMZ"   .res Assessment: Plan:    Plan:  PDMP reviewed  Ativan 0.'5mg'$  BID prn anxiety  Cymbalta '90mg'$  daily - reduce to '60mg'$  daily - 2nd attempt Add Prozac '10mg'$  daily for mood and possible w/d of decreasing Cymbalta Trazadone '50mg'$  at hs  Add NAC '600mg'$  BID  Propranolol for tachycardia since age 59  Genesight testing reviewed.  Time spent with patient was 25 minutes. Greater than 50% of face to face time with patient was spent on counseling and coordination of care.    RTC 4 weeks  Patient advised to contact office with any questions, adverse effects, or acute worsening in signs and symptoms.  Diagnoses and all orders for this visit:  GAD  (generalized anxiety disorder)  Obsessive-compulsive disorder, unspecified type -     FLUoxetine (PROZAC) 10 MG capsule; Take 1 capsule (10 mg total) by mouth daily.  Panic disorder  Sleep disturbance     Please see After Visit Summary for patient specific instructions.  Future Appointments  Date Time Provider [place]  09/14/2022  5:00 PM Cottle, Lucious Groves, LCSW [facility] None  09/23/2022  8:20 AM Niasia Lanphear, Berdie Ogren, NP [facility] None  10/01/2022  4:00 PM Cottle, Bambi G, LCSW [facility] None  10/07/2022  8:30 AM Stoioff, Ronda Fairly, MD [facility] None  11/17/2022 11:00 AM Lomax, Amy, NP [facility] None    No orders of the defined types were placed in this encounter.   -------------------------------

## 2022-09-15 NOTE — Progress Notes (Signed)
[place] Counselor/Therapist Progress Note  Patient ID: Faith Jordan, MRN: [id number],    Date: 09/14/2022  Time Spent: 60 minutes  Treatment Type: Individual Therapy - The patient was seen via video visit.  She gave verbal consent for the session to be on video on web ex, but we ended up using caregility..  The patient was in her home alone and therapist was in the office.  Reported Symptoms: anxiety, panic attacks  Mental Status Exam: Appearance:  Casual     Behavior: Appropriate  Motor: Normal  Speech/Language:  Normal Rate  Affect: Blunt  Mood: sad  Thought process: normal  Thought content:   WNL  Sensory/Perceptual disturbances:   WNL  Orientation: oriented to person, place, time/date, and situation  Attention: Good  Concentration: Good  Memory: WNL  Fund of knowledge:  Good  Insight:   Fair  Judgment:  Good  Impulse Control: Good   Risk Assessment: Danger to Self:  No Self-injurious Behavior: No Danger to Others: No Duty to Warn:no Physical Aggression / Violence:No  Access to Firearms a concern: No  Gang Involvement:No   Subjective: The patient was seen for an individual therapy session via video visit today.  The patient gave verbal permission for the session to be on video on web ex.  The patient was in her home alone and therapist was in the office.  The patient reports that Thanksgiving went okay.  She states that she did see her sister but only briefly and ignored her.  The patient is still having difficulty in dealing with her sister and her parents and that seems to be much of her worry and anxiety.  We talked today about codependency and setting limits and boundaries.  We talked about how she is taking on her mother's feelings and this is not helpful for her mother and is not helpful for her.  We talked about how to detach from her sister and that she allowed her sister to disrupt her life when she got to the place of having to leave work  prior to Thanksgiving.  I encouraged the patient to read the book Codependent No More.  The patient also reports that she went to see [facility] and she put her on 10 mg of Prozac and they are going to decrease the Cymbalta again.  The patient does seem to be managing her emotions a little better today.  Interventions: Cognitive Behavioral Therapy and Assertiveness/Communication,, problem solving, psychoeducation,  EMDR as indicated, Meditation and mindfulness  Diagnosis:No diagnosis found.  Plan: Client Abilities/Strengths  Intelligent, insightful, motivated  Client Treatment Preferences  Outpatient Individual therapy every other week  Client Statement of Needs  " I feel better now, but still need some help with anxiety"  Treatment Level  Outpatient Individual therapy  Symptoms  Frustration and anxiety related to providing oversight and caretaking to an aging, ailing, and dependent  parent.: No Description Entered (Status: improved). Hypervigilance (e.g., feeling constantly on edge,  experiencing concentration difficulties, having trouble falling or staying asleep, exhibiting a general  state of irritability).: No Description Entered (Status: improved). Motor tension (e.g., restlessness,  tiredness, shakiness, muscle tension).: No Description Entered (Status: improved).  Problems Addressed  Anxiety, Phase Of Life Problems, Anxiety  Goals 1. Learn and implement coping skills that result in a reduction of anxiety  and worry, and improved daily functioning. Objective Learn and implement calming skills to reduce overall anxiety and manage anxiety symptoms. Target Date: 2024-08-09Frequency: Weekly Progress:  0 Modality: individual  Related Interventions 1. Teach the client calming/relaxation skills (e.g., applied relaxation, progressive muscle  relaxation, cue controlled relaxation; mindful breathing; biofeedback) and how to discriminate  better between relaxation and tension; teach  the client how to apply these skills to his/her daily  life (e.g., New Directions in Progressive Muscle Relaxation by Casper Harrison, and  Hazlett-Stevens; Treating Generalized Anxiety Disorder by Rygh and Amparo Bristol). Objective Identify, challenge, and replace biased, fearful self-talk with positive, realistic, and empowering selftalk. Target Date: 2023-05-27 Frequency: weekly Progress: 0 Modality: individual Related Interventions 1. Explore the client's schema and self-talk that mediate his/her fear response; assist him/her in  challenging the biases; replace the distorted messages with reality-based alternatives and  positive, realistic self-talk that will increase his/her self-confidence in coping with irrational  fears (see Cognitive Therapy of Anxiety Disorders by Alison Stalling). Objective Learn and implement problem-solving strategies for realistically addressing worries. Target Date: 2024-08-09Frequency: weekly Progress: 0 Modality: individual 2. Resolve conflicted feelings and adapt to the new life circumstances. Objective Apply problem-solving skills to current circumstances. Target Date: 2023-05-27 Frequency: weekly Progress: 0 Modality: individual Related Interventions 1. Teach the client problem-resolution skills (e.g., defining the problem clearly, brainstorming  multiple solutions, listing the pros and cons of each solution, seeking input from others,  selecting and implementing a plan of action, evaluating outcome, and readjusting plan as  necessary).   3. Stabilize anxiety level while increasing ability to function on a daily  basis. 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  The patient approved this  plan.   Hemi Chacko G Samule Life, LCSW

## 2022-09-23 ENCOUNTER — Ambulatory Visit: Payer: BC Managed Care – PPO | Admitting: Adult Health

## 2022-09-30 ENCOUNTER — Ambulatory Visit: Payer: BC Managed Care – PPO | Admitting: Family Medicine

## 2022-10-01 ENCOUNTER — Ambulatory Visit (INDEPENDENT_AMBULATORY_CARE_PROVIDER_SITE_OTHER): Payer: BC Managed Care – PPO | Admitting: Psychology

## 2022-10-01 DIAGNOSIS — F411 Generalized anxiety disorder: Secondary | ICD-10-CM | POA: Diagnosis not present

## 2022-10-01 DIAGNOSIS — F429 Obsessive-compulsive disorder, unspecified: Secondary | ICD-10-CM

## 2022-10-04 NOTE — Progress Notes (Signed)
[place] Counselor/Therapist Progress Note  Patient ID: Faith Jordan, MRN: [id number],    Date: 10/01/2022  Time Spent: 60 minutes  Treatment Type: Individual Therapy - The patient was seen via video visit.  She gave verbal consent for the session to be on video on web ex, but we ended up using caregility..  The patient was in her home alone and therapist was in the office.  Reported Symptoms: anxiety, panic attacks  Mental Status Exam: Appearance:  Casual     Behavior: Appropriate  Motor: Normal  Speech/Language:  Normal Rate  Affect: Blunt  Mood: sad  Thought process: normal  Thought content:   WNL  Sensory/Perceptual disturbances:   WNL  Orientation: oriented to person, place, time/date, and situation  Attention: Good  Concentration: Good  Memory: WNL  Fund of knowledge:  Good  Insight:   Fair  Judgment:  Good  Impulse Control: Good   Risk Assessment: Danger to Self:  No Self-injurious Behavior: No Danger to Others: No Duty to Warn:no Physical Aggression / Violence:No  Access to Firearms a concern: No  Gang Involvement:No   Subjective: The patient was seen for an individual therapy session via video visit today.  The patient gave verbal permission for the session to be on video on web ex.  The patient was in her home alone and therapist was in the office.  The patient presents as pleasant and cooperative but her affect is blunted.  The patient reports that she has been taking her medication as prescribed and currently she is on some Cymbalta, Prozac, and I think Xanax.  She talked about having a problem at work that she felt anxious about.  We talked this through and it seems that some of this anxiety is probably warranted in this situation.  We also talked about how the patient seems to be having trouble with neutral or positive thoughts.  She apparently asked some of her friends to help her recognize when she was not being positive but she did so in a  sarcastic way.  Encouraged the patient to continue to work with herself on her thoughts and changing her self talk.  I also recommended that if she had something that she was anxious about that she could work out a plan and follow the plan and then she might be able to offload some of her anxiety. Interventions: Cognitive Behavioral Therapy and Assertiveness/Communication,, problem solving, psychoeducation,  EMDR as indicated, Meditation and mindfulness  Diagnosis:GAD (generalized anxiety disorder)  Obsessive-compulsive disorder, unspecified type  Plan: Client Abilities/Strengths  Intelligent, insightful, motivated  Client Treatment Preferences  Outpatient Individual therapy every other week  Client Statement of Needs  " I feel better now, but still need some help with anxiety"  Treatment Level  Outpatient Individual therapy  Symptoms  Frustration and anxiety related to providing oversight and caretaking to an aging, ailing, and dependent  parent.: No Description Entered (Status: improved). Hypervigilance (e.g., feeling constantly on edge,  experiencing concentration difficulties, having trouble falling or staying asleep, exhibiting a general  state of irritability).: No Description Entered (Status: improved). Motor tension (e.g., restlessness,  tiredness, shakiness, muscle tension).: No Description Entered (Status: improved).  Problems Addressed  Anxiety, Phase Of Life Problems, Anxiety  Goals 1. Learn and implement coping skills that result in a reduction of anxiety  and worry, and improved daily functioning. Objective Learn and implement calming skills to reduce overall anxiety and manage anxiety symptoms. Target Date: 2024-08-09Frequency: Weekly Progress: 0 Modality: individual  Related Interventions 1. Teach the client calming/relaxation skills (e.g., applied relaxation, progressive muscle  relaxation, cue controlled relaxation; mindful breathing; biofeedback) and how to  discriminate  better between relaxation and tension; teach the client how to apply these skills to his/her daily  life (e.g., New Directions in Progressive Muscle Relaxation by Casper Harrison, and  Hazlett-Stevens; Treating Generalized Anxiety Disorder by Rygh and Amparo Bristol). Objective Identify, challenge, and replace biased, fearful self-talk with positive, realistic, and empowering selftalk. Target Date: 2023-05-27 Frequency: weekly Progress: 0 Modality: individual Related Interventions 1. Explore the client's schema and self-talk that mediate his/her fear response; assist him/her in  challenging the biases; replace the distorted messages with reality-based alternatives and  positive, realistic self-talk that will increase his/her self-confidence in coping with irrational  fears (see Cognitive Therapy of Anxiety Disorders by Alison Stalling). Objective Learn and implement problem-solving strategies for realistically addressing worries. Target Date: 2024-08-09Frequency: weekly Progress: 0 Modality: individual 2. Resolve conflicted feelings and adapt to the new life circumstances. Objective Apply problem-solving skills to current circumstances. Target Date: 2023-05-27 Frequency: weekly Progress: 0 Modality: individual Related Interventions 1. Teach the client problem-resolution skills (e.g., defining the problem clearly, brainstorming  multiple solutions, listing the pros and cons of each solution, seeking input from others,  selecting and implementing a plan of action, evaluating outcome, and readjusting plan as  necessary).   3. Stabilize anxiety level while increasing ability to function on a daily  basis. 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  The patient approved this plan.   Said Rueb G Dashaun Onstott, LCSW

## 2022-10-06 ENCOUNTER — Other Ambulatory Visit: Payer: Self-pay | Admitting: Adult Health

## 2022-10-06 DIAGNOSIS — F429 Obsessive-compulsive disorder, unspecified: Secondary | ICD-10-CM

## 2022-10-07 ENCOUNTER — Ambulatory Visit: Payer: BC Managed Care – PPO | Admitting: Urology

## 2022-10-13 ENCOUNTER — Ambulatory Visit (INDEPENDENT_AMBULATORY_CARE_PROVIDER_SITE_OTHER): Payer: BC Managed Care – PPO | Admitting: Psychology

## 2022-10-13 DIAGNOSIS — F411 Generalized anxiety disorder: Secondary | ICD-10-CM

## 2022-10-13 DIAGNOSIS — F429 Obsessive-compulsive disorder, unspecified: Secondary | ICD-10-CM | POA: Diagnosis not present

## 2022-10-13 DIAGNOSIS — G479 Sleep disorder, unspecified: Secondary | ICD-10-CM

## 2022-10-13 NOTE — Progress Notes (Signed)
[facility] Counselor/Therapist Progress Note  Patient ID: Faith Jordan, MRN: [id number],    Date: 10/13/2022  Time Spent: 60 minutes  Treatment Type: Individual Therapy - The patient was seen via video visit.  She gave verbal consent for the session to be on video on web ex, but we ended up using caregility..  The patient was in her home alone and therapist was in the office.  Reported Symptoms: anxiety, panic attacks  Mental Status Exam: Appearance:  Casual     Behavior: Appropriate  Motor: Normal  Speech/Language:  Normal Rate  Affect: Blunt  Mood: sad  Thought process: normal  Thought content:   WNL  Sensory/Perceptual disturbances:   WNL  Orientation: oriented to person, place, time/date, and situation  Attention: Good  Concentration: Good  Memory: WNL  Fund of knowledge:  Good  Insight:   Fair  Judgment:  Good  Impulse Control: Good   Risk Assessment: Danger to Self:  No Self-injurious Behavior: No Danger to Others: No Duty to Warn:no Physical Aggression / Violence:No  Access to Firearms a concern: No  Gang Involvement:No   Subjective: The patient was seen for an individual therapy session via video visit today.  The patient gave verbal permission for the session to be on video on web ex.  The patient was in her home alone and therapist was in the office.  The patient presents as irritable today.  The patient reports that she has been more irritable than usual.  I asked her about what the status was on her medication and coming off of the Cymbalta.  Apparently she is decreased the Cymbalta down to 60 mg a day and is on 10 mg of Prozac.  It is hard to decipher how much of this is related to behavioral things and how much is related to her medication issues.  In exploring things further the patient says that her husband is getting on her nerves.  As we looked at the situation even further it appears that her husband is being somewhat insensitive about  her feelings.  I explained to her that she is going to have to explain to him what is going on but she needs to know and understand what is going on herself.  We talked about ways to say what she needs to say so that she can express her feelings.    Interventions: Cognitive Behavioral Therapy and Assertiveness/Communication,, problem solving, psychoeducation,  EMDR as indicated, Meditation and mindfulness  Diagnosis:GAD (generalized anxiety disorder)  Obsessive-compulsive disorder, unspecified type  Sleep disturbance  Plan: Client Abilities/Strengths  Intelligent, insightful, motivated  Client Treatment Preferences  Outpatient Individual therapy every other week  Client Statement of Needs  " I feel better now, but still need some help with anxiety"  Treatment Level  Outpatient Individual therapy  Symptoms  Frustration and anxiety related to providing oversight and caretaking to an aging, ailing, and dependent  parent.: No Description Entered (Status: improved). Hypervigilance (e.g., feeling constantly on edge,  experiencing concentration difficulties, having trouble falling or staying asleep, exhibiting a general  state of irritability).: No Description Entered (Status: improved). Motor tension (e.g., restlessness,  tiredness, shakiness, muscle tension).: No Description Entered (Status: improved).  Problems Addressed  Anxiety, Phase Of Life Problems, Anxiety  Goals 1. Learn and implement coping skills that result in a reduction of anxiety  and worry, and improved daily functioning. Objective Learn and implement calming skills to reduce overall anxiety and manage anxiety symptoms. Target Date: 2024-08-09Frequency:  Weekly Progress: 0 Modality: individual  Related Interventions 1. Teach the client calming/relaxation skills (e.g., applied relaxation, progressive muscle  relaxation, cue controlled relaxation; mindful breathing; biofeedback) and how to discriminate  better between  relaxation and tension; teach the client how to apply these skills to his/her daily  life (e.g., New Directions in Progressive Muscle Relaxation by Casper Harrison, and  Hazlett-Stevens; Treating Generalized Anxiety Disorder by Rygh and Amparo Bristol). Objective Identify, challenge, and replace biased, fearful self-talk with positive, realistic, and empowering selftalk. Target Date: 2023-05-27 Frequency: weekly Progress: 0 Modality: individual Related Interventions 1. Explore the client's schema and self-talk that mediate his/her fear response; assist him/her in  challenging the biases; replace the distorted messages with reality-based alternatives and  positive, realistic self-talk that will increase his/her self-confidence in coping with irrational  fears (see Cognitive Therapy of Anxiety Disorders by Alison Stalling). Objective Learn and implement problem-solving strategies for realistically addressing worries. Target Date: 2024-08-09Frequency: weekly Progress: 0 Modality: individual 2. Resolve conflicted feelings and adapt to the new life circumstances. Objective Apply problem-solving skills to current circumstances. Target Date: 2023-05-27 Frequency: weekly Progress: 0 Modality: individual Related Interventions 1. Teach the client problem-resolution skills (e.g., defining the problem clearly, brainstorming  multiple solutions, listing the pros and cons of each solution, seeking input from others,  selecting and implementing a plan of action, evaluating outcome, and readjusting plan as  necessary).   3. Stabilize anxiety level while increasing ability to function on a daily  basis. 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  The  patient approved this plan.   Messina Kosinski G Aarush Stukey, LCSW

## 2022-10-19 ENCOUNTER — Ambulatory Visit (INDEPENDENT_AMBULATORY_CARE_PROVIDER_SITE_OTHER): Payer: BC Managed Care – PPO | Admitting: Psychology

## 2022-10-19 ENCOUNTER — Ambulatory Visit (INDEPENDENT_AMBULATORY_CARE_PROVIDER_SITE_OTHER): Payer: BC Managed Care – PPO | Admitting: Adult Health

## 2022-10-19 ENCOUNTER — Ambulatory Visit (INDEPENDENT_AMBULATORY_CARE_PROVIDER_SITE_OTHER): Payer: BC Managed Care – PPO | Admitting: Primary Care

## 2022-10-19 ENCOUNTER — Encounter: Payer: Self-pay | Admitting: Primary Care

## 2022-10-19 ENCOUNTER — Encounter: Payer: Self-pay | Admitting: Adult Health

## 2022-10-19 VITALS — BP 124/82 | HR 90 | Temp 97.4°F | Ht 67.0 in | Wt 157.0 lb

## 2022-10-19 DIAGNOSIS — R5383 Other fatigue: Secondary | ICD-10-CM

## 2022-10-19 DIAGNOSIS — F411 Generalized anxiety disorder: Secondary | ICD-10-CM

## 2022-10-19 DIAGNOSIS — G479 Sleep disorder, unspecified: Secondary | ICD-10-CM

## 2022-10-19 DIAGNOSIS — F429 Obsessive-compulsive disorder, unspecified: Secondary | ICD-10-CM

## 2022-10-19 DIAGNOSIS — F410 Panic disorder [episodic paroxysmal anxiety] without agoraphobia: Secondary | ICD-10-CM

## 2022-10-19 MED ORDER — DULOXETINE HCL 30 MG PO CPEP: ORAL_CAPSULE | ORAL | 0 refills | Status: DC

## 2022-10-19 MED ORDER — TRAZODONE HCL 50 MG PO TABS: ORAL_TABLET | ORAL | 0 refills | Status: DC
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
## 2022-10-19 NOTE — Progress Notes (Signed)
Faith Jordan [id number] 1996-09-28 27 y.o.  Subjective:   Patient ID:  Faith Jordan is a 27 y.o. (DOB [birth year]) female.  Chief Complaint: No chief complaint on file.   HPI Faith Jordan presents to the office today for follow-up of GAD, OCD, panic disorder, and insomnia.  Referred by therapist - Bambi Cottle.  Describes mood today as "better than last week". Pleasant. Tearful at times. Mood symptoms - reports decreased depression - decreased lability an sadness. Feels apathetic at times - "maybe the whole time".  Was able to laugh recently. Reports some anxiety - having near panic attacks. Reports decreased irritability. Reports worry, rumination, and obsessive thoughts and acts. Reports some over thinking - more so at work - letting some things at home go". Stating "I'm trying to feel more intentional" - "more positive". Reports some side effects with the Cymbalta and Prozac combination - but less side effects overall. Stating "I'm not feeling as emotional".  Mood has improved - >sadness is better". Has tolerated the decrease in Cymbalta with the Prozac added - denies withdrawal side effects. Would like to continue decreasing the dose of Cymbalta. Still having some untoward side effects - GI upset. Improved interest and motivation. Taking medications as prescribed.  Energy levels lower. Active, does not have a regular exercise routine.   Enjoys some usual interests and activities. Recently married. Family local. Spending time with family. Started reading a book.  Appetite varies. Weight stable. Sleeps better some nights than others. Averages 8 to 10 hours of broken sleep. Focus and concentration difficulties at times. Completing tasks. Managing aspects of household. 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 Denies SI or HI.  Denies AH or VH. Denies self harm. Denies substance use.  Previous medication trials:  Cymbalta, Gabapentin, Trazadone, Hydroxyzine,  Propranolol, Wellbutrin, Lorazepam and Lexapro, Prozac, Zoloft, Prazosin, Effexor    GAD-7    Flowsheet Row Office Visit from 03/18/2020 in [place] at [facility]  Total GAD-7 Score 13      PHQ2-9    [place] Office Visit from 06/02/2022 in [place] at [facility] Visit from 07/30/2021 in [place] at [facility] Visit from 02/03/2021 in [place] at [facility] Visit from 04/27/2018 in [place] at [facility] Total Score 0 0 0 0  PHQ-9 Total Score 3 0 1 --      [place] ED from 05/31/2022 in [facility] [place] No Risk        Review of Systems:  Review of Systems  Musculoskeletal:  Negative for gait problem.  Neurological:  Negative for tremors.  Psychiatric/Behavioral:         Please refer to HPI    Medications: I have reviewed the patient's current medications.  Current Outpatient Medications  Medication Sig Dispense Refill   DULoxetine (CYMBALTA) 60 MG capsule Take 1 capsule (60 mg total) by mouth daily. for anxiety 90 capsule 0   EMGALITY 120 MG/ML SOAJ INJECT 120 MG INTO THE SKIN EVERY 30 (THIRTY) DAYS. 1 mL 3   FLUoxetine (PROZAC) 10 MG capsule TAKE 1 CAPSULE BY MOUTH EVERY DAY 90 capsule 0   LORazepam (ATIVAN) 0.5 MG tablet Take one tablet twice daily as needed for anxiety and panic attacks. 60 tablet 2   oxybutynin (DITROPAN) 5 MG tablet Take 1 tablet (5 mg total) by mouth every 8 (eight) hours as  needed for bladder spasms. 30 tablet 0   propranolol (INDERAL) 20 MG tablet TAKE 1 TABLET BY MOUTH EVERY DAY 90 tablet 3   topiramate (TOPAMAX) 50 MG tablet Take 1 tablet by mouth once daily 90 tablet 1   traZODone (DESYREL) 50 MG tablet Take 1 tablet (50 mg total) by mouth at bedtime. For sleep 90 tablet 0   Ubrogepant (UBRELVY) 100 MG TABS Take 100 mg by mouth at onset of migraine. May repeat in 2 hours if needed. Do not exceed 200 mg (2  tablets) in 24 hours. 10 tablet 3   No current facility-administered medications for this visit.    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, blistering peeling skin   Nsaids Dermatitis    Reaction similar to Stephens-Johnson, blistering peeling rash   Sulfa Antibiotics Swelling    Facial swelling    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   Angio-edema 04/27/2016   Angioedema    Aquagenic angio-edema-urticaria    Asthma    no problems /not used recently   Dysautonomia ([place])    Dysrhythmia    sinus tachycardia   Fibroid    right breast, adenoma   Headache(784.0)    Hives 12/22/2015   Pneumonia    hx  6th grade   Vasculitis ([facility])     Past Medical History, Surgical history, Social history, and Family history were reviewed and updated as appropriate.   Please see review of systems for further details on the patient's review from today.   Objective:   Physical Exam:  There were no vitals taken for this visit.  Physical Exam Constitutional:      General: She is not in acute distress. Musculoskeletal:        General: No deformity.  Neurological:     Mental Status: She is alert and oriented to person, place, and time.     Coordination: Coordination normal.  Psychiatric:        Attention and Perception: Attention and perception normal. She does not perceive auditory or visual hallucinations.        Mood and Affect: Mood normal. Mood is not anxious or depressed. Affect is not labile, blunt, angry or inappropriate.        Speech: Speech normal.        Behavior: Behavior normal.        Thought Content: Thought content normal. Thought content is not paranoid or delusional. Thought content does not include homicidal or suicidal ideation. Thought content does not include homicidal or suicidal plan.        Cognition and Memory:  Cognition and memory normal.        Judgment: Judgment normal.     Comments: Insight intact     Lab Review:     Component Value Date/Time   NA 133 (L) 06/02/2022 0924   NA 142 09/28/2017 1459   K 4.2 06/02/2022 0924   CL 106 06/02/2022 0924   CO2 22 06/02/2022 0924   GLUCOSE 87 06/02/2022 0924   BUN 12 06/02/2022 0924   BUN 3 (L) 09/28/2017 1459   CREATININE 0.81 06/02/2022 0924   CALCIUM 9.7 06/02/2022 0924   PROT 6.7 11/06/2019 0855   PROT 6.5 09/28/2017 1459   ALBUMIN 4.2 11/06/2019 0855   ALBUMIN 4.0 09/28/2017 1459   AST 11 11/06/2019 0855   ALT  9 11/06/2019 0855   ALKPHOS 43 11/06/2019 0855   BILITOT 0.5 11/06/2019 0855   BILITOT 0.2 09/28/2017 1459   GFRNONAA >60 11/21/2020 1909   GFRAA >60 12/01/2017 1018       Component Value Date/Time   WBC 9.7 06/02/2022 0924   RBC 5.20 (H) 06/02/2022 0924   HGB 15.5 (H) 06/02/2022 0924   HGB 11.8 09/28/2017 1459   HCT 45.7 06/02/2022 0924   HCT 36.7 09/28/2017 1459   PLT 348.0 06/02/2022 0924   PLT 368 09/28/2017 1459   MCV 87.9 06/02/2022 0924   MCV 88 09/28/2017 1459   MCH 30.0 11/21/2020 1909   MCHC 33.9 06/02/2022 0924   RDW 12.8 06/02/2022 0924   RDW 13.1 09/28/2017 1459   LYMPHSABS 2.8 06/02/2022 0924   LYMPHSABS 2.8 09/28/2017 1459   MONOABS 0.8 06/02/2022 0924   EOSABS 0.1 06/02/2022 0924   EOSABS 0.0 09/28/2017 1459   BASOSABS 0.1 06/02/2022 0924   BASOSABS 0.0 09/28/2017 1459    No results found for: "POCLITH", "LITHIUM"   No results found for: "PHENYTOIN", "PHENOBARB", "VALPROATE", "CBMZ"   .res Assessment: Plan:    Plan:  PDMP reviewed  Ativan 0.'5mg'$  BID prn anxiety  Decrease Cymbalta '60mg'$  to '30mg'$  daily  Increase Prozac '10mg'$  to '20mg'$  daily for mood and possible w/d of decreasing Cymbalta Trazadone '50mg'$  at hs  NAC '600mg'$  BID  Propranolol for tachycardia since age 28  Genesight testing reviewed.  Time spent with patient was 25 minutes. Greater than 50% of face to face time with patient  was spent on counseling and coordination of care.    RTC 4 weeks  Patient advised to contact office with any questions, adverse effects, or acute worsening in signs and symptoms.  There are no diagnoses linked to this encounter.   Please see After Visit Summary for patient specific instructions.  Future Appointments  Date Time Provider [place]  10/19/2022  2:40 PM Pleas Koch, NP [facility]  10/19/2022  4:00 PM Cottle, Bambi G, LCSW [facility] None  11/05/2022  9:00 AM Cottle, Bambi G, LCSW [facility] None  11/11/2022  4:00 PM Cottle, Bambi G, LCSW [facility] None  11/17/2022 11:00 AM Lomax, Amy, NP [facility] None  11/17/2022  4:00 PM Cottle, Bambi G, LCSW [facility] None    No orders of the defined types were placed in this encounter.   -------------------------------

## 2022-10-19 NOTE — Patient Instructions (Signed)
Stop by the lab prior to leaving today. I will notify you of your results once received.   It was a pleasure to see you today!  

## 2022-10-19 NOTE — Assessment & Plan Note (Addendum)
Chronic, deteriorated.  Unclear etiology. It is possible that this could be a side effect of her medication.   No alarm signs.   Checking labs today including thyroid studies, CBC, iron studies, vitamin D and B12, etc.  Continue with therapy and psychiatry evaluation.

## 2022-10-19 NOTE — Progress Notes (Signed)
Subjective:    Patient ID: Faith Jordan, female    DOB: 07/01/1996, 27 y.o.   MRN: [id number]  HPI  Faith Jordan is a very pleasant 27 y.o. female with a history fo chronic fatigue, ANS, migraines, chronic back pain, sinus tachycardia, GAD who presents today to discuss fatigue.  Chronic for years, increased over the last few months. She is following therapy and psychiatry, her therapist suggested that she have some labs checked given her fatigue.   She is taking vitamin B12 gummies, she thinks she's taking 1000 mcg daily. She is taking vitamin D3, at least >1000 IU daily. She recently became more consistent in taking her B12 and vitamin D3.  She is tapering down on her Cymbalta. She recently underwent a sleep study which was negative for sleep apnea. She denies changes in menstrual cycles or other causes for fatigue.    Review of Systems  Constitutional:  Positive for fatigue.  Gastrointestinal:  Negative for blood in stool.  Genitourinary:  Negative for menstrual problem.  Psychiatric/Behavioral:  The patient is nervous/anxious.          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   Angio-edema 04/27/2016   Angioedema    Aquagenic angio-edema-urticaria    Asthma    no problems /not used recently   Dysautonomia ([place])    Dysrhythmia    sinus tachycardia   Fibroid    right breast, adenoma   Headache(784.0)    Hives 12/22/2015   Pneumonia    hx  6th grade   Vasculitis ([facility])     Social History   Socioeconomic History   Marital status: Significant Other    Spouse name: Not on file   Number of children: Not on file   Years of education: Not on file   Highest education level: Bachelor's degree (e.g., BA, AB, BS)  Occupational History   Not on file  Tobacco Use   Smoking status: Never   Smokeless tobacco: Never  Vaping Use   Vaping Use: Never used  Substance and Sexual Activity   Alcohol use: No   Drug use: No   Sexual  activity: Yes  Other Topics Concern   Not on file  Social History Narrative   Lives with fiance   R handed   Caffeine: 1 drink a day   Social Determinants of Health   Financial Resource Strain: Not on file  Food Insecurity: Not on file  Transportation Needs: Not on file  Physical Activity: Not on file  Stress: Not on file  Social Connections: Not on file  Intimate Partner Violence: Not on file    Past Surgical History:  Procedure Laterality Date   ADENOIDECTOMY     BREAST LUMPECTOMY Right    BREAST LUMPECTOMY Right    MASS EXCISION Right 10/07/2014   Procedure: EXCISION OF RIGHT BREAST MASS;  Surgeon: Jackolyn Confer, MD;  Location: [facility];  Service: General;  Laterality: Right;   TONSILLECTOMY AND ADENOIDECTOMY     TOOTH EXTRACTION      Family History  Problem Relation Age of Onset   Depression Mother    Migraines Mother    Narcolepsy Mother    Hypothyroidism Mother    Myasthenia gravis Maternal Grandfather    Breast cancer Maternal Grandmother 10   Asthma Other    Cancer Other    Epilepsy Other    Allergic rhinitis Neg Hx    Angioedema Neg Hx  Atopy Neg Hx    Eczema Neg Hx    Immunodeficiency Neg Hx    Urticaria Neg Hx     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 90 capsule 0   EMGALITY 120 MG/ML SOAJ INJECT 120 MG INTO THE SKIN EVERY 30 (THIRTY) DAYS. 1 mL 3   FLUoxetine (PROZAC) 10 MG capsule TAKE 1 CAPSULE BY MOUTH EVERY DAY 90 capsule 0   LORazepam (ATIVAN) 0.5 MG tablet Take one tablet twice daily as needed for anxiety and panic attacks. 60 tablet 2   propranolol (INDERAL) 20 MG  tablet TAKE 1 TABLET BY MOUTH EVERY DAY 90 tablet 3   topiramate (TOPAMAX) 50 MG tablet Take 1 tablet by mouth once daily 90 tablet 1   traZODone (DESYREL) 50 MG tablet Take one to 3 tablets at bedtime for sleep. 90 tablet 0   Ubrogepant (UBRELVY) 100 MG TABS Take 100 mg by mouth at onset of migraine. May repeat in 2 hours if needed. Do not exceed 200 mg (2 tablets) in 24 hours. 10 tablet 3   oxybutynin (DITROPAN) 5 MG tablet Take 1 tablet (5 mg total) by mouth every 8 (eight) hours as needed for bladder spasms. (Patient not taking: Reported on 10/19/2022) 30 tablet 0   No current facility-administered medications on file prior to visit.    BP 124/82   Pulse 90   Temp (!) 97.4 F (36.3 C) (Temporal)   Ht '5\' 7"'$  (1.702 m)   Wt 157 lb (71.2 kg)   SpO2 98%   BMI 24.59 kg/m  Objective:   Physical Exam Cardiovascular:     Rate and Rhythm: Normal rate and regular rhythm.  Pulmonary:     Effort: Pulmonary effort is normal.     Breath sounds: Normal breath sounds.  Musculoskeletal:     Cervical back: Neck supple.  Skin:    General: Skin is warm and dry.  Psychiatric:        Mood and Affect: Mood normal.           Assessment & Plan:   Problem List Items Addressed This Visit       Other   Fatigue - Primary    Chronic, deteriorated.  Unclear etiology. It is possible that this could be a side effect of her medication.   No alarm signs.   Checking labs today including thyroid studies, CBC, iron studies, vitamin D and B12, etc.  Continue with therapy and psychiatry evaluation.         Relevant Orders   CBC   Vitamin B12   VITAMIN D 25 Hydroxy (Vit-D Deficiency, Fractures)   TSH   T4, free   Basic metabolic panel   IBC + Ferritin       Pleas Koch, NP

## 2022-10-20 ENCOUNTER — Other Ambulatory Visit (INDEPENDENT_AMBULATORY_CARE_PROVIDER_SITE_OTHER): Payer: BC Managed Care – PPO

## 2022-10-20 DIAGNOSIS — R7309 Other abnormal glucose: Secondary | ICD-10-CM | POA: Diagnosis not present

## 2022-10-20 LAB — IBC + FERRITIN
Ferritin: 26.3 ng/mL (ref 10.0–291.0)
Iron: 87 ug/dL (ref 42–145)
Saturation Ratios: 18.4 % — ABNORMAL LOW (ref 20.0–50.0)
TIBC: 473.2 ug/dL — ABNORMAL HIGH (ref 250.0–450.0)
Transferrin: 338 mg/dL (ref 212.0–360.0)

## 2022-10-20 LAB — CBC
HCT: 40 % (ref 36.0–46.0)
Hemoglobin: 13.9 g/dL (ref 12.0–15.0)
MCHC: 34.7 g/dL (ref 30.0–36.0)
MCV: 84.8 fl (ref 78.0–100.0)
Platelets: 326 10*3/uL (ref 150.0–400.0)
RBC: 4.72 Mil/uL (ref 3.87–5.11)
RDW: 12.6 % (ref 11.5–15.5)
WBC: 9.2 10*3/uL (ref 4.0–10.5)

## 2022-10-20 LAB — BASIC METABOLIC PANEL
BUN: 12 mg/dL (ref 6–23)
CO2: 21 mEq/L (ref 19–32)
Calcium: 9.2 mg/dL (ref 8.4–10.5)
Chloride: 108 mEq/L (ref 96–112)
Creatinine, Ser: 0.78 mg/dL (ref 0.40–1.20)
GFR: 104.54 mL/min (ref >60.00)
Glucose, Bld: 127 mg/dL — ABNORMAL HIGH (ref 70–99)
Potassium: 3.9 mEq/L (ref 3.5–5.1)
Sodium: 139 mEq/L (ref 135–145)

## 2022-10-20 LAB — HEMOGLOBIN A1C: Hgb A1c MFr Bld: 5.5 % (ref 4.6–6.5)

## 2022-10-20 LAB — VITAMIN B12: Vitamin B-12: 249 pg/mL (ref 211–911)

## 2022-10-20 LAB — T4, FREE: Free T4: 0.71 ng/dL (ref 0.60–1.60)

## 2022-10-20 LAB — TSH: TSH: 0.93 u[IU]/mL (ref 0.35–5.50)

## 2022-10-20 LAB — VITAMIN D 25 HYDROXY (VIT D DEFICIENCY, FRACTURES): VITD: 40.78 ng/mL (ref 30.00–100.00)

## 2022-10-31 DIAGNOSIS — B349 Viral infection, unspecified: Secondary | ICD-10-CM | POA: Diagnosis not present

## 2022-11-04 ENCOUNTER — Telehealth: Payer: Self-pay | Admitting: Adult Health

## 2022-11-04 ENCOUNTER — Other Ambulatory Visit: Payer: Self-pay | Admitting: Adult Health

## 2022-11-04 DIAGNOSIS — F411 Generalized anxiety disorder: Secondary | ICD-10-CM

## 2022-11-04 NOTE — Telephone Encounter (Signed)
Pt called at 9:14a.  She said Gina tapered her Cymbalta a few weeks ago.  She said she not feeling good and has missed a few days of work.  She wants someone to call her back to discuss what she needs to do.  Next appt 1/31

## 2022-11-04 NOTE — Telephone Encounter (Signed)
Called and spoke with patient.

## 2022-11-04 NOTE — Telephone Encounter (Signed)
Pt stated she is emotional,crying alot for no reason and having overall mood issues.She is also nauseous and has body aches please advise

## 2022-11-05 ENCOUNTER — Telehealth: Payer: Self-pay | Admitting: Adult Health

## 2022-11-05 ENCOUNTER — Ambulatory Visit (INDEPENDENT_AMBULATORY_CARE_PROVIDER_SITE_OTHER): Payer: BC Managed Care – PPO | Admitting: Psychology

## 2022-11-05 DIAGNOSIS — F429 Obsessive-compulsive disorder, unspecified: Secondary | ICD-10-CM | POA: Diagnosis not present

## 2022-11-05 DIAGNOSIS — G479 Sleep disorder, unspecified: Secondary | ICD-10-CM

## 2022-11-05 DIAGNOSIS — F411 Generalized anxiety disorder: Secondary | ICD-10-CM | POA: Diagnosis not present

## 2022-11-05 NOTE — Telephone Encounter (Signed)
Pt lvm that she saw gina yesterday and that she would like a letter or note written to her work. She said that she will come pick it up. If you have any questions call her at [phone]

## 2022-11-05 NOTE — Telephone Encounter (Signed)
LVM to rtc 

## 2022-11-05 NOTE — Progress Notes (Signed)
[facility] Counselor/Therapist Progress Note  Patient ID: Faith Jordan, MRN: [id number],    Date:  11/05/2022  Time Spent: 60 minutes  Treatment Type: Individual Therapy - The patient was seen via video visit.  She gave verbal consent for the session to be on video on web ex..  The patient was in her home alone and therapist was in the office.  Reported Symptoms: anxiety, panic attacks  Mental Status Exam: Appearance:  Casual     Behavior: Appropriate  Motor: Normal  Speech/Language:  Normal Rate  Affect: depressed  Mood: depressed  Thought process: normal  Thought content:   WNL  Sensory/Perceptual disturbances:   WNL  Orientation: oriented to person, place, time/date, and situation  Attention: Good  Concentration: Good  Memory: WNL  Fund of knowledge:  Good  Insight:   Fair  Judgment:  Good  Impulse Control: Good   Risk Assessment: Danger to Self:  No Self-injurious Behavior: No Danger to Others: No Duty to Warn:no Physical Aggression / Violence:No  Access to Firearms a concern: No  Gang Involvement:No   Subjective: The patient was seen for an individual therapy session via video visit today.  The patient gave verbal permission for the session to be on video on web ex.  The patient was in her home alone and therapist was in the office.  The patient presents as sad and depressed.  The patient reports that her medication has been decreased again by 30 mg and she is struggling with coming off of the Cymbalta.  The patient has contacted her medication provider and they are going to work on a plan to continue to get her off of the Cymbalta.  I explained to her that it is possible that some of her sadness and depression could be related to coming off the medication.  I recommended that she get FMLA papers because she has called out a work twice this week.  We broached the subject of her job and she seems not very happy with where she works and this could be  causing some of the problem but we probably do not need to make a decision until she is off of this other medicine and on the right medicine.  The patient denies any suicidal ideations and I recommended that she do some things to take care of herself this weekend and distract herself.  We will continue to work on behavioral coping strategies once her medication gets a little more still stable.  Interventions: Cognitive Behavioral Therapy and Assertiveness/Communication,, problem solving, psychoeducation,  EMDR as indicated, Meditation and mindfulness  Diagnosis:GAD (generalized anxiety disorder)  Sleep disturbance  Obsessive-compulsive disorder, unspecified type  Plan: Client Abilities/Strengths  Intelligent, insightful, motivated  Client Treatment Preferences  Outpatient Individual therapy every other week  Client Statement of Needs  " I feel better now, but still need some help with anxiety"  Treatment Level  Outpatient Individual therapy  Symptoms  Frustration and anxiety related to providing oversight and caretaking to an aging, ailing, and dependent  parent.: No Description Entered (Status: improved). Hypervigilance (e.g., feeling constantly on edge,  experiencing concentration difficulties, having trouble falling or staying asleep, exhibiting a general  state of irritability).: No Description Entered (Status: improved). Motor tension (e.g., restlessness,  tiredness, shakiness, muscle tension).: No Description Entered (Status: improved).  Problems Addressed  Anxiety, Phase Of Life Problems, Anxiety  Goals 1. Learn and implement coping skills that result in a reduction of anxiety  and worry, and improved  daily functioning. Objective Learn and implement calming skills to reduce overall anxiety and manage anxiety symptoms. Target Date: 2024-08-09Frequency: Weekly Progress: 0 Modality: individual  Related Interventions 1. Teach the client calming/relaxation skills (e.g., applied  relaxation, progressive muscle  relaxation, cue controlled relaxation; mindful breathing; biofeedback) and how to discriminate  better between relaxation and tension; teach the client how to apply these skills to his/her daily  life (e.g., New Directions in Progressive Muscle Relaxation by Casper Harrison, and  Hazlett-Stevens; Treating Generalized Anxiety Disorder by Rygh and Amparo Bristol). Objective Identify, challenge, and replace biased, fearful self-talk with positive, realistic, and empowering selftalk. Target Date: 2023-05-27 Frequency: weekly Progress: 0 Modality: individual Related Interventions 1. Explore the client's schema and self-talk that mediate his/her fear response; assist him/her in  challenging the biases; replace the distorted messages with reality-based alternatives and  positive, realistic self-talk that will increase his/her self-confidence in coping with irrational  fears (see Cognitive Therapy of Anxiety Disorders by Alison Stalling). Objective Learn and implement problem-solving strategies for realistically addressing worries. Target Date: 2024-08-09Frequency: weekly Progress: 0 Modality: individual 2. Resolve conflicted feelings and adapt to the new life circumstances. Objective Apply problem-solving skills to current circumstances. Target Date: 2023-05-27 Frequency: weekly Progress: 0 Modality: individual Related Interventions 1. Teach the client problem-resolution skills (e.g., defining the problem clearly, brainstorming  multiple solutions, listing the pros and cons of each solution, seeking input from others,  selecting and implementing a plan of action, evaluating outcome, and readjusting plan as  necessary).   3. Stabilize anxiety level while increasing ability to function on a daily  basis. Diagnosis  300.02 (Generalized anxiety disorder) - Open - [Signifier: n/a]  Axis  none 309.28 (Adjustment disorder with mixed anxiety and depressed  mood) -  Open - [Signifier: n/a]  Adjustment Disorder,  With Anxiety   Conditions For Discharge Achievement of treatment goals and objectives.  The patient approved this plan.   Smantha Boakye G Shirl Ludington, LCSW

## 2022-11-08 NOTE — Telephone Encounter (Signed)
Pls call and get dates she needs a note for and we can get it ready for her.

## 2022-11-08 NOTE — Telephone Encounter (Signed)
Patient said she was only out on Thursday, 1/18. She was asking about a note to cover for future dates, but she isn't yet eligible for FMLA.

## 2022-11-09 NOTE — Telephone Encounter (Signed)
Letter written. Will have staff call to read and confirm information.

## 2022-11-10 ENCOUNTER — Other Ambulatory Visit: Payer: Self-pay | Admitting: Adult Health

## 2022-11-10 DIAGNOSIS — G479 Sleep disorder, unspecified: Secondary | ICD-10-CM

## 2022-11-10 NOTE — Telephone Encounter (Signed)
Pt sent you a message

## 2022-11-11 ENCOUNTER — Ambulatory Visit (INDEPENDENT_AMBULATORY_CARE_PROVIDER_SITE_OTHER): Payer: BC Managed Care – PPO | Admitting: Psychology

## 2022-11-11 DIAGNOSIS — F411 Generalized anxiety disorder: Secondary | ICD-10-CM | POA: Diagnosis not present

## 2022-11-11 DIAGNOSIS — F429 Obsessive-compulsive disorder, unspecified: Secondary | ICD-10-CM

## 2022-11-11 NOTE — Progress Notes (Signed)
[facility] Counselor/Therapist Progress Note  Patient ID: KHALANI NOVOA, MRN: [id number],    Date:  11/11/2022  Time Spent: 60 minutes  Treatment Type: Individual Therapy - The patient was seen via video visit.  She gave verbal consent for the session to be on video on web ex..  The patient was in her home alone and therapist was in the office.  Reported Symptoms: anxiety, panic attacks  Mental Status Exam: Appearance:  Casual     Behavior: Appropriate  Motor: Normal  Speech/Language:  Normal Rate  Affect: depressed  Mood: depressed  Thought process: normal  Thought content:   WNL  Sensory/Perceptual disturbances:   WNL  Orientation: oriented to person, place, time/date, and situation  Attention: Good  Concentration: Good  Memory: WNL  Fund of knowledge:  Good  Insight:   Fair  Judgment:  Good  Impulse Control: Good   Risk Assessment: Danger to Self:  No Self-injurious Behavior: No Danger to Others: No Duty to Warn:no Physical Aggression / Violence:No  Access to Firearms a concern: No  Gang Involvement:No   Subjective: The patient was seen for an individual therapy session via video visit today.  The patient gave verbal permission for the session to be on video on web ex.  The patient was in her home alone and therapist was in the office.  The patient was much less tearful today.  She presented today as somewhat apathetic.  She reports that she still is having some irritability but she is not feeling as tearful and as sad as she was last week.  I do believe this was probably related to her medication decrease.  We talked about some issues that she is having with her work and I explained that probably she does not need to make a decision right now while her medication is not correct.  We talked about how she has been getting frustrated with her husband and the way she has been communicating with him.  I explained to her that she might want to think about  communicating a different way as she sounds very critical of him.  The patient says that she has not really been as anxious but that she has been irritated by the things that she is obsessive about.  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   Interventions: Cognitive Behavioral Therapy and Assertiveness/Communication,, problem solving, psychoeducation,  EMDR as indicated, Meditation and mindfulness  Diagnosis:GAD (generalized anxiety disorder)  Obsessive-compulsive disorder, unspecified type  Plan: Client Abilities/Strengths  Intelligent, insightful, motivated  Client Treatment Preferences  Outpatient Individual therapy every other week  Client Statement of Needs  " I feel better now, but still need some help with anxiety"  Treatment Level  Outpatient Individual therapy  Symptoms  Frustration and anxiety related to providing oversight and caretaking to an aging, ailing, and dependent  parent.: No Description Entered (Status: improved). Hypervigilance (e.g., feeling constantly on edge,  experiencing concentration difficulties, having trouble falling or staying asleep, exhibiting a general  state of irritability).: No Description Entered (Status: improved). Motor tension (e.g., restlessness,  tiredness, shakiness, muscle tension).: No Description Entered (Status: improved).  Problems Addressed  Anxiety, Phase Of Life Problems, Anxiety  Goals 1. Learn and implement coping skills that result in a reduction of anxiety  and worry, and improved daily functioning. Objective Learn and implement calming skills to reduce overall anxiety and manage anxiety symptoms. Target Date:  2024-08-09Frequency: Weekly Progress: 0 Modality: individual  Related Interventions 1. Teach the client calming/relaxation skills (e.g., applied relaxation, progressive muscle  relaxation, cue controlled relaxation; mindful breathing; biofeedback) and how  to discriminate  better between relaxation and tension; teach the client how to apply these skills to his/her daily  life (e.g., New Directions in Progressive Muscle Relaxation by Casper Harrison, and  Hazlett-Stevens; Treating Generalized Anxiety Disorder by Rygh and Amparo Bristol). Objective Identify, challenge, and replace biased, fearful self-talk with positive, realistic, and empowering selftalk. Target Date: 2023-05-27 Frequency: weekly Progress: 0 Modality: individual Related Interventions 1. Explore the client's schema and self-talk that mediate his/her fear response; assist him/her in  challenging the biases; replace the distorted messages with reality-based alternatives and  positive, realistic self-talk that will increase his/her self-confidence in coping with irrational  fears (see Cognitive Therapy of Anxiety Disorders by Alison Stalling). Objective Learn and implement problem-solving strategies for realistically addressing worries. Target Date: 2024-08-09Frequency: weekly Progress: 0 Modality: individual 2. Resolve conflicted feelings and adapt to the new life circumstances. Objective Apply problem-solving skills to current circumstances. Target Date: 2023-05-27 Frequency: weekly Progress: 0 Modality: individual Related Interventions 1. Teach the client problem-resolution skills (e.g., defining the problem clearly, brainstorming  multiple solutions, listing the pros and cons of each solution, seeking input from others,  selecting and implementing a plan of action, evaluating outcome, and readjusting plan as  necessary).   3. Stabilize anxiety level while increasing ability to function on a daily  basis. 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  The patient approved this plan.   Annaliah Rivenbark G Bernell Haynie, LCSW

## 2022-11-17 ENCOUNTER — Ambulatory Visit (INDEPENDENT_AMBULATORY_CARE_PROVIDER_SITE_OTHER): Payer: BC Managed Care – PPO | Admitting: Psychology

## 2022-11-17 ENCOUNTER — Encounter: Payer: Self-pay | Admitting: Adult Health

## 2022-11-17 ENCOUNTER — Ambulatory Visit (INDEPENDENT_AMBULATORY_CARE_PROVIDER_SITE_OTHER): Payer: BC Managed Care – PPO | Admitting: Family Medicine

## 2022-11-17 ENCOUNTER — Ambulatory Visit (INDEPENDENT_AMBULATORY_CARE_PROVIDER_SITE_OTHER): Payer: BC Managed Care – PPO | Admitting: Adult Health

## 2022-11-17 ENCOUNTER — Encounter: Payer: Self-pay | Admitting: Family Medicine

## 2022-11-17 VITALS — BP 112/73 | HR 79 | Ht 67.0 in | Wt 159.5 lb

## 2022-11-17 DIAGNOSIS — F410 Panic disorder [episodic paroxysmal anxiety] without agoraphobia: Secondary | ICD-10-CM

## 2022-11-17 DIAGNOSIS — G43709 Chronic migraine without aura, not intractable, without status migrainosus: Secondary | ICD-10-CM

## 2022-11-17 DIAGNOSIS — F411 Generalized anxiety disorder: Secondary | ICD-10-CM

## 2022-11-17 DIAGNOSIS — F429 Obsessive-compulsive disorder, unspecified: Secondary | ICD-10-CM

## 2022-11-17 DIAGNOSIS — G4719 Other hypersomnia: Secondary | ICD-10-CM

## 2022-11-17 DIAGNOSIS — G479 Sleep disorder, unspecified: Secondary | ICD-10-CM

## 2022-11-17 MED ORDER — TOPIRAMATE 50 MG PO TABS
50.0000 mg | ORAL_TABLET | Freq: Every day | ORAL | 3 refills | Status: DC
  Filled 2023-01-25: qty 90, 90d supply, fill #0
  Filled 2023-06-17: qty 90, 90d supply, fill #1
  Filled 2023-10-30: qty 90, 90d supply, fill #2

## 2022-11-17 MED ORDER — FLUOXETINE HCL 10 MG PO CAPS: ORAL_CAPSULE | ORAL | 2 refills | Status: DC

## 2022-11-17 MED ORDER — EMGALITY 120 MG/ML ~~LOC~~ SOAJ
120.0000 mg | SUBCUTANEOUS | 3 refills | Status: DC
  Filled 2023-01-25: qty 3, 90d supply, fill #0
  Filled 2023-01-25: qty 3, fill #0
  Filled 2023-01-25: qty 1, 30d supply, fill #0
  Filled 2023-01-28: qty 3, 90d supply, fill #0
  Filled 2023-02-15 – 2023-05-16 (×5): qty 3, 90d supply, fill #1
  Filled 2023-08-21 – 2023-09-05 (×3): qty 3, 90d supply, fill #2

## 2022-11-17 MED ORDER — UBRELVY 100 MG PO TABS
ORAL_TABLET | ORAL | 3 refills | Status: DC
  Filled 2023-01-25: qty 10, 30d supply, fill #0

## 2022-11-17 NOTE — Progress Notes (Signed)
[place] Counselor/Therapist Progress Note  Patient ID: Faith Jordan, MRN: [id number],    Date:  11/17/2022  Time Spent: 60 minutes  Treatment Type: Individual Therapy - The patient was seen via video visit.  She gave verbal consent for the session to be on video on web ex..  The patient was in her home alone and therapist was in the office.  Reported Symptoms: anxiety, panic attacks  Mental Status Exam: Appearance:  Casual     Behavior: Appropriate  Motor: Normal  Speech/Language:  Normal Rate  Affect: normal  Mood: pleasant  Thought process: normal  Thought content:   WNL  Sensory/Perceptual disturbances:   WNL  Orientation: oriented to person, place, time/date, and situation  Attention: Good  Concentration: Good  Memory: WNL  Fund of knowledge:  Good  Insight:   Fair  Judgment:  Good  Impulse Control: Good   Risk Assessment: Danger to Self:  No Self-injurious Behavior: No Danger to Others: No Duty to Warn:no Physical Aggression / Violence:No  Access to Firearms a concern: No  Gang Involvement:No   Subjective: The patient was seen for an individual therapy session via video visit today.  The patient gave verbal permission for the session to be on video on web ex.  The patient was in her home alone and therapist was in the office.  The patient was  pleasant and cooperative today.  The patient reports that she went to a baby shower on Sunday and she ended up having a confrontation with her sister.  We processed what happened and it seems that the confrontation was appropriate in lots of ways.  Her sister was trying to tell a cousin of hers who was pregnant and having swelling not to go to the hospital.  The patient was the only medical professional there and felt that her cousin needed to go to the hospital and she basically told her sister that she needed to stay out of a conversation.  We talked about the only thing that might need to happen being that  she could have said something different in a different way.  The patient admitted that she probably could have used different communication.  The patient reports that she went to see Barnett Applebaum and they are taking her off of the rest of the Cymbalta and they increased her Prozac.  The patient seems to be feeling much better.  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  I gave the patient positive feedback as she seems to be doing better in regard to her anxiety.  Interventions: Cognitive Behavioral Therapy and Assertiveness/Communication,, problem solving, psychoeducation,  EMDR as indicated, Meditation and mindfulness  Diagnosis:GAD (generalized anxiety disorder)  Obsessive-compulsive disorder, unspecified type  Plan: Client Abilities/Strengths  Intelligent, insightful, motivated  Client Treatment Preferences  Outpatient Individual therapy every other week  Client Statement of Needs  " I feel better now, but still need some help with anxiety"  Treatment Level  Outpatient Individual therapy  Symptoms  Frustration and anxiety related to providing oversight and caretaking to an aging, ailing, and dependent  parent.: No Description Entered (Status: improved). Hypervigilance (e.g., feeling constantly on edge,  experiencing concentration difficulties, having trouble falling or staying asleep, exhibiting a general  state of irritability).: No Description Entered (Status: improved). Motor tension (e.g., restlessness,  tiredness, shakiness, muscle tension).: No Description Entered (Status: improved).  Problems  Addressed  Anxiety, Phase Of Life Problems, Anxiety  Goals 1. Learn and implement coping skills that result in a reduction of anxiety  and worry, and improved daily functioning. Objective Learn and implement calming skills to reduce overall anxiety and manage anxiety symptoms. Target Date:  2024-08-09Frequency: Weekly Progress: 0 Modality: individual  Related Interventions 1. Teach the client calming/relaxation skills (e.g., applied relaxation, progressive muscle  relaxation, cue controlled relaxation; mindful breathing; biofeedback) and how to discriminate  better between relaxation and tension; teach the client how to apply these skills to his/her daily  life (e.g., New Directions in Progressive Muscle Relaxation by Casper Harrison, and  Hazlett-Stevens; Treating Generalized Anxiety Disorder by Rygh and Amparo Bristol). Objective Identify, challenge, and replace biased, fearful self-talk with positive, realistic, and empowering selftalk. Target Date: 2023-05-27 Frequency: weekly Progress: 0 Modality: individual Related Interventions 1. Explore the client's schema and self-talk that mediate his/her fear response; assist him/her in  challenging the biases; replace the distorted messages with reality-based alternatives and  positive, realistic self-talk that will increase his/her self-confidence in coping with irrational  fears (see Cognitive Therapy of Anxiety Disorders by Alison Stalling). Objective Learn and implement problem-solving strategies for realistically addressing worries. Target Date: 2024-08-09Frequency: weekly Progress: 10 Modality: individual 2. Resolve conflicted feelings and adapt to the new life circumstances. Objective Apply problem-solving skills to current circumstances. Target Date: 2023-05-27 Frequency: weekly Progress: 10 Modality: individual Related Interventions 1. Teach the client problem-resolution skills (e.g., defining the problem clearly, brainstorming  multiple solutions, listing the pros and cons of each solution, seeking input from others,  selecting and implementing a plan of action, evaluating outcome, and readjusting plan as  necessary).   3. Stabilize anxiety level while increasing ability to function on a daily   basis. 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  The patient approved this plan.   Samarrah Tranchina G Avram Danielson, LCSW

## 2022-11-17 NOTE — Patient Instructions (Signed)
Below is our plan:  We will continue Emgality, topiramate and Ubrelvy   Please make sure you are staying well hydrated. I recommend 50-60 ounces daily. Well balanced diet and regular exercise encouraged. Consistent sleep schedule with 6-8 hours recommended.   Please continue follow up with care team as directed.   Follow up with me in 1 year   You may receive a survey regarding today's visit. I encourage you to leave honest feed back as I do use this information to improve patient care. Thank you for seeing me today!

## 2022-11-17 NOTE — Progress Notes (Signed)
PATIENT: Faith Jordan DOB: 08-16-1996  REASON FOR VISIT: follow up HISTORY FROM: patient  Chief Complaint  Patient presents with   Room 2    Pt is here Alone. Pt states that things have been going good with her migraines. Pt states that she get 0-1 migraines per week. Pt states that sometimes the weather will cause her to have a migraine.      HISTORY OF PRESENT ILLNESS:  11/17/22 ALL: [name] returns for follow up for migraines and excessive daytime sleepiness. PSG 07/2022 did not indicate any concerns for sleep breathing disorder or desaturations. Counseling and pain management recommended by Dr Brett Fairy. We switched her back to [facility] in 05/2022 as Ajovy was ineffective and continued topiramate '50mg'$  daily and Ubrelvy PRN. Migraines are stable. She may have 3-4 per month. Roselyn Meier works . She feels weather changes and menstrual cycle are common triggers.   06/01/2022 ALL: [name] returns for follow up for migraines. She was last seen 05/2021 and doing well. We advised to continue [place] and discussed weaning topiramate as headaches were rare. She was switched to [place] in 02/2022 due to insurance coverage and has continued topiramate. Since, migraines have worsened. She is having 4-5 migraines every month. Roselyn Meier continues to offer relief. She has had more dizziness and nausea with headaches.   She was seen in consult with Dr Brett Fairy 05/2022 for excessive sleepiness and witnessed apneic events. She has a history of OSA as a child resolved following tonsil and adenoids removed. Sleep study was ordered.   06/04/2021 ALL: [name] returns for follow up for migraines. She continues Emgality, topiramate '50mg'$  daily and Ubrelvy as needed. She is doing very well. She rarely has headaches. She can not remember the last time she took [place]. She recently switched birth control to [place]. She is feeling well today and without concerns.   06/03/2020 ALL:  Faith Jordan is a 27 y.o.  female here today for follow up for migraines. She continues topiramate, Emgality and propranolol for prevention. Roselyn Meier helps with migraine abortion. She reports that she is doing very well from a migraine perspective. She can not remember the last time she needed to take [place]. She may have 1-2 mild headaches just prior to due date for Emgality. 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 She is staying very busy.   HISTORY: (copied from my note on 05/31/2019)  Faith Jordan is a 27 y.o. female here today for follow up of migraines. She was restarted on topiramate 50 mg at night about 4 months ago. She continued Emgality injections and propranalol (originally prescribed for tachycardia).    She is taking Amethia for birth control. She is taking OCP continuously for three months with quarterly menstrual cycles. She reports that migraines are very well managed until the week of menstrual cycle. She states that migraine is usually retro orbital (usually right but can be on the left) with pounding and light/sound sensitivity. She has tried [place], Aleve, and Tylenol intermittently for acute management but does not feel anything helps. She can not tolerate triptans due to tachycardia. She has a reported allergy of dermatitis with ibuprofen. She does not have any reaction with Aleve.    She is currently on her menstrual cycle. She has a terrible migraine that is mostly located behind her right eye. She does have mild pain of left forehead as well. She is requesting a nerve block today as this has worked well  in the past.    HISTORY: (copied from my note on 01/16/2019)   Faith Jordan is a 27 y.o. female seen today for follow up.  She reports that her migraines have worsened specifically over the last month.  She feels that stress is definitely a trigger.  She is in school and finishing up with exams at this time.  She knows that weather also contributes.  She notices significant worsening  around the time of her menstrual cycle.  Pain is typically right-sided retro-orbital pressure.  Occasionally there is throbbing, light sensitivity and nausea.  She continues Emgality every month.  She is using Roselyn Meier for abortive therapy that does help temporarily.  She is also on propranolol for tachycardia.  She reports a history of syncope as well that is followed closely by her cardiologist.   She has tried topiramate in the past but is unsure why this medication was stopped.  She is also tried imipemide but reports that it did not help.  She has taken Maxalt and Imitrex for abortive therapy but reports tachycardia with a triptan therapy.  She is allergic to NSAIDs.   REVIEW OF SYSTEMS: Out of a complete 14 system review of symptoms, the patient complains only of the following symptoms, headaches, fatigue, dizziness  and all other reviewed systems are negative.  ALLERGIES: 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, blistering peeling skin   Nsaids Dermatitis    Reaction similar to Stephens-Johnson, blistering peeling rash   Sulfa Antibiotics Swelling    Facial swelling    HOME MEDICATIONS: Outpatient Medications Prior to Visit  Medication Sig Dispense Refill   DULoxetine (CYMBALTA) 30 MG capsule Take one capsule daily. 90 capsule 0   FLUoxetine (PROZAC) 10 MG capsule Take 3 capsules every day. 90 capsule 2   LORazepam (ATIVAN) 0.5 MG tablet Take one tablet twice daily as needed for anxiety and panic attacks. 60 tablet 2   propranolol (INDERAL) 20 MG tablet TAKE 1 TABLET BY MOUTH EVERY DAY 90 tablet 3   traZODone (DESYREL) 50 MG tablet TAKE ONE TO 3 TABLETS AT BEDTIME FOR SLEEP. 270 tablet 0   EMGALITY 120 MG/ML SOAJ INJECT 120 MG INTO THE SKIN EVERY 30 (THIRTY) DAYS. 1 mL 3   topiramate (TOPAMAX) 50 MG tablet Take 1 tablet by mouth once daily 90 tablet 1   Ubrogepant (UBRELVY) 100 MG TABS Take 100 mg by mouth at onset of  migraine. May repeat in 2 hours if needed. Do not exceed 200 mg (2 tablets) in 24 hours. 10 tablet 3   oxybutynin (DITROPAN) 5 MG tablet Take 1 tablet (5 mg total) by mouth every 8 (eight) hours as needed for bladder spasms. (Patient not taking: Reported on 10/19/2022) 30 tablet 0   No facility-administered medications prior to visit.    PAST MEDICAL HISTORY: 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   Angio-edema 04/27/2016   Angioedema    Aquagenic angio-edema-urticaria    Asthma    no problems /not used recently   Dysautonomia ([place])    Dysrhythmia    sinus tachycardia   Fibroid    right breast, adenoma   Headache(784.0)    Hives 12/22/2015   Pneumonia    hx  6th grade   Vasculitis ([place])     PAST SURGICAL HISTORY: Past Surgical History:  Procedure Laterality Date   ADENOIDECTOMY     BREAST  LUMPECTOMY Right    BREAST LUMPECTOMY Right    MASS EXCISION Right 10/07/2014   Procedure: EXCISION OF RIGHT BREAST MASS;  Surgeon: Jackolyn Confer, MD;  Location: [facility];  Service: General;  Laterality: Right;   TONSILLECTOMY AND ADENOIDECTOMY     TOOTH EXTRACTION      FAMILY HISTORY: Family History  Problem Relation Age of Onset   Depression Mother    Migraines Mother    Narcolepsy Mother    Hypothyroidism Mother    Myasthenia gravis Maternal Grandfather    Breast cancer Maternal Grandmother 45   Asthma Other    Cancer Other    Epilepsy Other    Allergic rhinitis Neg Hx    Angioedema Neg Hx    Atopy Neg Hx    Eczema Neg Hx    Immunodeficiency Neg Hx    Urticaria Neg Hx     SOCIAL HISTORY: Social History   Socioeconomic History   Marital status: Significant Other    Spouse name: Not on file   Number of children: Not on file   Years of education: Not on file   Highest education level: Bachelor's degree (e.g., BA, AB, BS)  Occupational History   Not on file  Tobacco Use   Smoking status: Never   Smokeless tobacco: Never   Vaping Use   Vaping Use: Never used  Substance and Sexual Activity   Alcohol use: No   Drug use: No   Sexual activity: Yes  Other Topics Concern   Not on file  Social History Narrative   Lives with fiance   R handed   Caffeine: 1 drink a day   Social Determinants of Health   Financial Resource Strain: Not on file  Food Insecurity: Not on file  Transportation Needs: Not on file  Physical Activity: Not on file  Stress: Not on file  Social Connections: Not on file  Intimate Partner Violence: Not on file      PHYSICAL EXAM  Vitals:   11/17/22 1051  BP: 112/73  Pulse: 79  Weight: 159 lb 8 oz (72.3 kg)  Height: '5\' 7"'$  (1.702 m)      Body mass index is 24.98 kg/m.  Generalized: Well developed, in no acute distress  Cardiology: normal rate and rhythm, no murmur noted Respiratory: clear to auscultation bilaterally  Neurological examination  Mentation: Alert oriented to time, place, history taking. Follows all commands speech and language fluent Cranial nerve II-XII: Pupils were equal round reactive to light. Extraocular movements were full, visual field were full  Motor: The motor testing reveals 5 over 5 strength of all 4 extremities. Good symmetric motor tone is noted throughout.  Gait and station: Gait is normal.   DIAGNOSTIC DATA (LABS, IMAGING, TESTING) - I reviewed patient records, labs, notes, testing and imaging myself where available.      No data to display           Lab Results  Component Value Date   WBC 9.2 10/19/2022   HGB 13.9 10/19/2022   HCT 40.0 10/19/2022   MCV 84.8 10/19/2022   PLT 326.0 10/19/2022      Component Value Date/Time   NA 139 10/19/2022 1510   NA 142 09/28/2017 1459   K 3.9 10/19/2022 1510   CL 108 10/19/2022 1510   CO2 21 10/19/2022 1510   GLUCOSE 127 (H) 10/19/2022 1510   BUN 12 10/19/2022 1510   BUN 3 (L) 09/28/2017 1459   CREATININE 0.78 10/19/2022 1510   CALCIUM  9.2 10/19/2022 1510   PROT 6.7 11/06/2019 0855    PROT 6.5 09/28/2017 1459   ALBUMIN 4.2 11/06/2019 0855   ALBUMIN 4.0 09/28/2017 1459   AST 11 11/06/2019 0855   ALT 9 11/06/2019 0855   ALKPHOS 43 11/06/2019 0855   BILITOT 0.5 11/06/2019 0855   BILITOT 0.2 09/28/2017 1459   GFRNONAA >60 11/21/2020 1909   GFRAA >60 12/01/2017 1018   Lab Results  Component Value Date   CHOL 200 04/27/2018   HDL 57.80 04/27/2018   LDLCALC 111 (H) 04/27/2018   TRIG 153.0 (H) 04/27/2018   CHOLHDL 3 04/27/2018   Lab Results  Component Value Date   HGBA1C 5.5 10/20/2022   Lab Results  Component Value Date   VITAMINB12 249 10/19/2022   Lab Results  Component Value Date   TSH 0.93 10/19/2022    ASSESSMENT AND PLAN 27 y.o. year old female  has a past medical history of Acne, Acquired breast deformity (12/22/2015), Angio-edema (04/27/2016), Angioedema, Aquagenic angio-edema-urticaria, Asthma, Dysautonomia ([place]), Dysrhythmia, Fibroid, Headache(784.0), Hives (12/22/2015), Pneumonia, and Vasculitis ([place]). here with     ICD-10-CM   1. Chronic migraine without aura without status migrainosus, not intractable  G43.709     2. Excessive daytime sleepiness  G47.19       Brianna reports headaches have improved. We will continue Emgality monthly and continue topiramate '50mg'$  for prevention and continue Ubrelvy for abortive therapy. She will continue propranolol '20mg'$  daily for tachycardia, managed by cardiology. Advised against pregnancy. She will continue healthy lifestyle habits. She will follow up with me in 1 year, sooner if needed.    No orders of the defined types were placed in this encounter.     Meds ordered this encounter  Medications   topiramate (TOPAMAX) 50 MG tablet    Sig: Take 1 tablet by mouth once daily    Dispense:  90 tablet    Refill:  3    Order Specific Question:   Supervising Provider    Answer:   Melvenia Beam [[id number]]   Ubrogepant (UBRELVY) 100 MG TABS    Sig: Take 100 mg by mouth at onset of migraine. May repeat in 2  hours if needed. Do not exceed 200 mg (2 tablets) in 24 hours.    Dispense:  10 tablet    Refill:  3    Order Specific Question:   Supervising Provider    Answer:   Melvenia Beam [[id number]]   Galcanezumab-gnlm (EMGALITY) 120 MG/ML SOAJ    Sig: Inject 120 mg into the skin every 30 (thirty) days.    Dispense:  3 mL    Refill:  3    emgality has been approved by insurance through feb 2024    Order Specific Question:   Supervising Provider    Answer:   Melvenia Beam [[id number]]       [place], FNP-C 11/17/2022, 11:20 AM [facility] [address] [place] [employer], [place] [address] [phone]

## 2022-11-17 NOTE — Progress Notes (Signed)
Faith Jordan [id number] 04-Nov-1995 27 y.o.  Subjective:   Patient ID:  Faith Jordan is a 27 y.o. (DOB [birth year]) female.  Chief Complaint: No chief complaint on file.   HPI Faith Jordan presents to the office today for follow-up of GAD, OCD, panic disorder, and insomnia.  Referred by therapist - Bambi Cottle.  Describes mood today as "ok". Pleasant. Decreased tearfulness - not since a week and a half ago. Mood symptoms - reports decreased depression. Not feeling as "apathetic". Stating "I felt really good yesterday and today". Reports some anxiety. Reports one panic attack over the past few weeks. Reports irritability - "over the past week". Reports worry, rumination, and obsessive thoughts and acts.  Reports some over thinking - "I feel like it's warranted". Has decreased dose of Cymbalta to '30mg'$  daily and has increased Prozac to '20mg'$  daily. Reports side effects - physical and mental side effects from tapering off the Cymbalta - but would like to try and discontinue the Cymbalta. Still having some untoward side effects - GI upset. Improved interest and motivation. Taking medications as prescribed.  Energy levels lower. Active, does not have a regular exercise routine.   Enjoys some usual interests and activities. Recently married. Family local. Spending time with family. Started reading a book.  Appetite varies. Weight stable. Sleeps better some nights than others. Averages 8 to 10 hours of broken sleep. Focus and concentration difficulties at times. Completing tasks. Managing aspects of household. 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 Denies SI or HI.  Denies AH or VH. Denies self harm. Denies substance use.  Previous medication trials:  Cymbalta, Gabapentin, Trazadone, Hydroxyzine, Propranolol, Wellbutrin, Lorazepam and Lexapro, Prozac, Zoloft, Prazosin, Effexor     GAD-7    Flowsheet Row Office Visit from 03/18/2020 in [place]  at [facility]  Total GAD-7 Score 13      PHQ2-9    [place] Visit from 06/02/2022 in [place] at [facility] Visit from 07/30/2021 in [place] at [facility] Visit from 02/03/2021 in [place] at [facility] Visit from 04/27/2018 in [place] at [facility]  PHQ-2 Total Score 0 0 0 0  PHQ-9 Total Score 3 0 1 --      [place] ED from 05/31/2022 in [facility] at [facility])  [place] No Risk        Review of Systems:  Review of Systems  Musculoskeletal:  Negative for gait problem.  Neurological:  Negative for tremors.  Psychiatric/Behavioral:         Please refer to HPI    Medications: I have reviewed the patient's current medications.  Current Outpatient Medications  Medication Sig Dispense Refill   DULoxetine (CYMBALTA) 30 MG capsule Take one capsule daily. 90 capsule 0   EMGALITY 120 MG/ML SOAJ INJECT 120 MG INTO THE SKIN EVERY 30 (THIRTY) DAYS. 1 mL 3   FLUoxetine (PROZAC) 10 MG capsule Take 3 capsules every day. 90 capsule 2   LORazepam (ATIVAN) 0.5 MG tablet Take one tablet twice daily as needed for anxiety and panic attacks. 60 tablet 2   oxybutynin (DITROPAN) 5 MG tablet Take 1 tablet (5 mg total) by mouth every 8 (eight) hours as needed for bladder spasms. (Patient not taking: Reported on 10/19/2022) 30 tablet 0   propranolol (INDERAL) 20 MG tablet TAKE 1  TABLET BY MOUTH EVERY DAY 90 tablet 3   topiramate (TOPAMAX) 50 MG tablet Take 1 tablet by mouth once daily 90 tablet 1   traZODone (DESYREL) 50 MG tablet TAKE ONE TO 3 TABLETS AT BEDTIME FOR SLEEP. 270 tablet 0   Ubrogepant (UBRELVY) 100 MG TABS Take 100 mg by mouth at onset of migraine. May repeat in 2 hours if needed. Do not exceed 200 mg (2 tablets) in 24 hours. 10 tablet 3   No current facility-administered medications for this visit.     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, blistering peeling skin   Nsaids Dermatitis    Reaction similar to Stephens-Johnson, blistering peeling rash   Sulfa Antibiotics Swelling    Facial swelling    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   Angio-edema 04/27/2016   Angioedema    Aquagenic angio-edema-urticaria    Asthma    no problems /not used recently   Dysautonomia ([place])    Dysrhythmia    sinus tachycardia   Fibroid    right breast, adenoma   Headache(784.0)    Hives 12/22/2015   Pneumonia    hx  6th grade   Vasculitis (HCC)     Past Medical History, Surgical history, Social history, and Family history were reviewed and updated as appropriate.   Please see review of systems for further details on the patient's review from today.   Objective:   Physical Exam:  There were no vitals taken for this visit.  Physical Exam Constitutional:      General: She is not in acute distress. Musculoskeletal:        General: No deformity.  Neurological:     Mental Status: She is alert and oriented to person, place, and time.     Coordination: Coordination normal.  Psychiatric:        Attention and Perception: Attention and perception normal. She does not perceive auditory or visual hallucinations.        Mood and Affect: Mood normal. Mood is not anxious or depressed. Affect is not labile, blunt, angry or inappropriate.        Speech: Speech normal.        Behavior: Behavior normal.        Thought Content: Thought content normal. Thought content is not paranoid or delusional. Thought content does not include homicidal or suicidal ideation. Thought content does not include homicidal or suicidal plan.        Cognition and Memory: Cognition and memory normal.        Judgment: Judgment normal.     Comments: Insight intact      Lab Review:     Component Value Date/Time   NA 139 10/19/2022 1510   NA 142 09/28/2017 1459   K 3.9 10/19/2022 1510   CL 108 10/19/2022 1510   CO2 21 10/19/2022 1510   GLUCOSE 127 (H) 10/19/2022 1510   BUN 12 10/19/2022 1510   BUN 3 (L) 09/28/2017 1459   CREATININE 0.78 10/19/2022 1510   CALCIUM 9.2 10/19/2022 1510   PROT 6.7 11/06/2019 0855   PROT 6.5 09/28/2017 1459   ALBUMIN 4.2 11/06/2019 0855   ALBUMIN 4.0 09/28/2017 1459   AST 11 11/06/2019 0855   ALT 9 11/06/2019 0855   ALKPHOS 43 11/06/2019 0855   BILITOT 0.5 11/06/2019 0855   BILITOT 0.2  09/28/2017 1459   GFRNONAA >60 11/21/2020 1909   GFRAA >60 12/01/2017 1018       Component Value Date/Time   WBC 9.2 10/19/2022 1510   RBC 4.72 10/19/2022 1510   HGB 13.9 10/19/2022 1510   HGB 11.8 09/28/2017 1459   HCT 40.0 10/19/2022 1510   HCT 36.7 09/28/2017 1459   PLT 326.0 10/19/2022 1510   PLT 368 09/28/2017 1459   MCV 84.8 10/19/2022 1510   MCV 88 09/28/2017 1459   MCH 30.0 11/21/2020 1909   MCHC 34.7 10/19/2022 1510   RDW 12.6 10/19/2022 1510   RDW 13.1 09/28/2017 1459   LYMPHSABS 2.8 06/02/2022 0924   LYMPHSABS 2.8 09/28/2017 1459   MONOABS 0.8 06/02/2022 0924   EOSABS 0.1 06/02/2022 0924   EOSABS 0.0 09/28/2017 1459   BASOSABS 0.1 06/02/2022 0924   BASOSABS 0.0 09/28/2017 1459    No results found for: "POCLITH", "LITHIUM"   No results found for: "PHENYTOIN", "PHENOBARB", "VALPROATE", "CBMZ"   .res Assessment: Plan:    Plan:  PDMP reviewed  Ativan 0.'5mg'$  BID prn anxiety  Cymbalta '30mg'$  daily - will take every other day for a week and then leave off Increase Prozac '20mg'$  to '30mg'$  daily  Trazadone '50mg'$  at hs  NAC '600mg'$  BID  Propranolol for tachycardia since age 29  Genesight testing reviewed.  Time spent with patient was 25 minutes. Greater than 50% of face to face time with patient was spent on counseling and coordination of care.    RTC 4 weeks  Patient advised to contact office with  any questions, adverse effects, or acute worsening in signs and symptoms. Diagnoses and all orders for this visit:  Sleep disturbance  Panic disorder  GAD (generalized anxiety disorder)  Obsessive-compulsive disorder, unspecified type -     FLUoxetine (PROZAC) 10 MG capsule; Take 3 capsules every day.     Please see After Visit Summary for patient specific instructions.  Future Appointments  Date Time Provider [place]  11/17/2022 11:00 AM Lomax, Amy, NP [facility] None  11/17/2022  4:00 PM Cottle, Bambi G, LCSW [facility] None  12/10/2022  3:00 PM Cottle, Bambi G, LCSW [facility] None  12/22/2022  5:00 PM Cottle, Bambi G, LCSW [facility] None  12/28/2022  4:00 PM Cottle, Bambi G, LCSW [facility] None  01/14/2023  1:00 PM Cottle, Bambi G, LCSW [facility] None    No orders of the defined types were placed in this encounter.   -------------------------------

## 2022-11-25 ENCOUNTER — Ambulatory Visit (INDEPENDENT_AMBULATORY_CARE_PROVIDER_SITE_OTHER): Payer: BC Managed Care – PPO | Admitting: Psychology

## 2022-11-25 ENCOUNTER — Telehealth: Payer: Self-pay | Admitting: Adult Health

## 2022-11-25 DIAGNOSIS — F411 Generalized anxiety disorder: Secondary | ICD-10-CM

## 2022-11-25 DIAGNOSIS — F429 Obsessive-compulsive disorder, unspecified: Secondary | ICD-10-CM

## 2022-11-25 NOTE — Telephone Encounter (Signed)
Called and spoke with patient.

## 2022-11-25 NOTE — Telephone Encounter (Signed)
Pt just stopped Cymbalta, weaning off of it, and is having difficulty with it. Symptoms, head feels squeezed, feels in slow motion, emotional and tearful, headache, and very down.  Anything she can do to help with the w/d symptoms. She is taking the Ativan twice daily.

## 2022-11-25 NOTE — Telephone Encounter (Signed)
Pt is having side effects from weaning off cymbalta please review

## 2022-11-28 NOTE — Progress Notes (Signed)
[facility] Counselor/Therapist Progress Note  Patient ID: Faith Jordan, MRN: [id number],    Date:  11/25/2022  Time Spent: 60 minutes  Treatment Type: Individual Therapy - The patient was seen via video visit.  She gave verbal consent for the session to be on video on web ex..  The patient was in her home alone and therapist was in the office.  Reported Symptoms: anxiety, panic attacks  Mental Status Exam: Appearance:  Casual     Behavior: Appropriate  Motor: Normal  Speech/Language:  Normal Rate  Affect: normal  Mood: depressed  Thought process: normal  Thought content:   WNL  Sensory/Perceptual disturbances:   WNL  Orientation: oriented to person, place, time/date, and situation  Attention: Good  Concentration: Good  Memory: WNL  Fund of knowledge:  Good  Insight:   Fair  Judgment:  Good  Impulse Control: Good   Risk Assessment: Danger to Self:  No Self-injurious Behavior: No Danger to Others: No Duty to Warn:no Physical Aggression / Violence:No  Access to Firearms a concern: No  Gang Involvement:No   Subjective: The patient was seen for an individual therapy session via video visit today.  The patient gave verbal permission for the session to be on video on web ex.  The patient was in her home alone and therapist was in the office.  The patient called and requested an earlier appointment because she reported that she was not doing well.  She presents as depressed and tearful.  She states that she is not sure why she is having this reaction and her husband had dropped her off at her mother's house because she was so tearful and crying.  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  We talked about this being related to her medication change and this is the last decrease of Cymbalta that she has.  I did some case management with her to try to get her a note from her medication provider to  excuse her from work for these 3 days because she is so distraught.  The patient is to check in with me in the morning just to make she sure she is okay.  She denies suicidal ideation and she has people that can be with her.  She reports that she feels guilty because she is missing work.  We talked about this not being a rational thought because of the medication change. Interventions: Cognitive Behavioral Therapy and Assertiveness/Communication,, problem solving, psychoeducation,  EMDR as indicated, Meditation and mindfulness  Diagnosis:GAD (generalized anxiety disorder)  Obsessive-compulsive disorder, unspecified type  Plan: Client Abilities/Strengths  Intelligent, insightful, motivated  Client Treatment Preferences  Outpatient Individual therapy every other week  Client Statement of Needs  " I feel better now, but still need some help with anxiety"  Treatment Level  Outpatient Individual therapy  Symptoms  Frustration and anxiety related to providing oversight and caretaking to an aging, ailing, and dependent  parent.: No Description Entered (Status: improved). Hypervigilance (e.g., feeling constantly on edge,  experiencing concentration difficulties, having trouble falling or staying asleep, exhibiting a general  state of irritability).: No Description Entered (Status: improved). Motor tension (e.g., restlessness,  tiredness, shakiness, muscle tension).: No Description Entered (Status: improved).  Problems Addressed  Anxiety, Phase Of Life Problems, Anxiety  Goals 1. Learn and implement coping skills that result in a reduction of anxiety  and worry, and improved daily functioning. Objective Learn and  implement calming skills to reduce overall anxiety and manage anxiety symptoms. Target Date: 2024-08-09Frequency: Weekly Progress: 0 Modality: individual  Related Interventions 1. Teach the client calming/relaxation skills (e.g., applied relaxation, progressive muscle  relaxation,  cue controlled relaxation; mindful breathing; biofeedback) and how to discriminate  better between relaxation and tension; teach the client how to apply these skills to his/her daily  life (e.g., New Directions in Progressive Muscle Relaxation by Casper Harrison, and  Hazlett-Stevens; Treating Generalized Anxiety Disorder by Rygh and Amparo Bristol). Objective Identify, challenge, and replace biased, fearful self-talk with positive, realistic, and empowering selftalk. Target Date: 2023-05-27 Frequency: weekly Progress: 0 Modality: individual Related Interventions 1. Explore the client's schema and self-talk that mediate his/her fear response; assist him/her in  challenging the biases; replace the distorted messages with reality-based alternatives and  positive, realistic self-talk that will increase his/her self-confidence in coping with irrational  fears (see Cognitive Therapy of Anxiety Disorders by Alison Stalling). Objective Learn and implement problem-solving strategies for realistically addressing worries. Target Date: 2024-08-09Frequency: weekly Progress: 10 Modality: individual 2. Resolve conflicted feelings and adapt to the new life circumstances. Objective Apply problem-solving skills to current circumstances. Target Date: 2023-05-27 Frequency: weekly Progress: 10 Modality: individual Related Interventions 1. Teach the client problem-resolution skills (e.g., defining the problem clearly, brainstorming  multiple solutions, listing the pros and cons of each solution, seeking input from others,  selecting and implementing a plan of action, evaluating outcome, and readjusting plan as  necessary).   3. Stabilize anxiety level while increasing ability to function on a daily  basis. 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  The patient approved this plan.   Maddoxx Burkitt G Kimie Pidcock, LCSW

## 2022-12-10 ENCOUNTER — Ambulatory Visit (INDEPENDENT_AMBULATORY_CARE_PROVIDER_SITE_OTHER): Payer: BC Managed Care – PPO | Admitting: Adult Health

## 2022-12-10 ENCOUNTER — Ambulatory Visit (INDEPENDENT_AMBULATORY_CARE_PROVIDER_SITE_OTHER): Payer: BC Managed Care – PPO | Admitting: Psychology

## 2022-12-10 ENCOUNTER — Ambulatory Visit: Payer: BC Managed Care – PPO

## 2022-12-10 ENCOUNTER — Ambulatory Visit (INDEPENDENT_AMBULATORY_CARE_PROVIDER_SITE_OTHER): Payer: BC Managed Care – PPO | Admitting: Podiatry

## 2022-12-10 ENCOUNTER — Encounter: Payer: Self-pay | Admitting: Adult Health

## 2022-12-10 DIAGNOSIS — M2042 Other hammer toe(s) (acquired), left foot: Secondary | ICD-10-CM

## 2022-12-10 DIAGNOSIS — M2041 Other hammer toe(s) (acquired), right foot: Secondary | ICD-10-CM

## 2022-12-10 DIAGNOSIS — F411 Generalized anxiety disorder: Secondary | ICD-10-CM

## 2022-12-10 DIAGNOSIS — F429 Obsessive-compulsive disorder, unspecified: Secondary | ICD-10-CM

## 2022-12-10 DIAGNOSIS — G479 Sleep disorder, unspecified: Secondary | ICD-10-CM

## 2022-12-10 DIAGNOSIS — F410 Panic disorder [episodic paroxysmal anxiety] without agoraphobia: Secondary | ICD-10-CM

## 2022-12-10 MED ORDER — LORAZEPAM 0.5 MG PO TABS: ORAL_TABLET | ORAL | 2 refills | Status: DC

## 2022-12-10 MED ORDER — FLUOXETINE HCL 40 MG PO CAPS
ORAL_CAPSULE | ORAL | 1 refills | Status: DC
  Filled 2023-01-25: qty 90, 90d supply, fill #0

## 2022-12-10 NOTE — Progress Notes (Signed)
SAREN HOUDESHELL [id number] 1995-11-10 27 y.o.  Subjective:   Patient ID:  Faith Jordan is a 28 y.o. (DOB 1995-11-14) female.  Chief Complaint: No chief complaint on file.   HPI KEMAYA BEDWARD presents to the office today for follow-up of GAD, OCD, panic disorder, and insomnia.  Referred by therapist - Bambi Cottle.  Describes mood today as "ok". Pleasant. Decreased tearfulness. Mood symptoms - denies depression. Reports situational anxiety. Reports one recent panic attack - "woke up anxious - putting notice in at work". Reports irritability - "at times" - "some overreacting". Reports worry, rumination, and over thinking. Reports some obsessive thoughts and acts. Stating "I can't relax until things are done". Reports tapering off Cymbalta - side effects mostly gone. Continues to take the Prozac at '30mg'$  daily, but is willing to increase dose to '40mg'$  daily. Improved interest and motivation. Taking medications as prescribed.  Energy levels lower. Active, does not have a regular exercise routine.   Enjoys some usual interests and activities. Recently married. Family local. Spending time with family. Started reading a book.  Appetite varies. Weight stable. Sleeps better some nights than others. Averages 8 to 10 hours of broken sleep. Focus and concentration improved. Completing tasks. Managing aspects of household. 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  Denies AH or VH. Denies self harm. Denies substance use.  Previous medication trials:  Cymbalta, Gabapentin, Trazadone, Hydroxyzine, Propranolol, Wellbutrin, Lorazepam and Lexapro, Prozac, Zoloft, Prazosin, Effexor  GAD-7    Flowsheet Row Office Visit from 03/18/2020 in [place] at [facility]  Total GAD-7 Score 13      PHQ2-9    [place] Visit from 06/02/2022 in [place] at [facility] Visit from 07/30/2021 in [place] at  [facility] Visit from 02/03/2021 in [place] at [facility] Visit from 04/27/2018 in [place] at [facility]  PHQ-2 Total Score 0 0 0 0  PHQ-9 Total Score 3 0 1 --      [place] ED from 05/31/2022 in [facility])  [place] No Risk        Review of Systems:  Review of Systems  Musculoskeletal:  Negative for gait problem.  Neurological:  Negative for tremors.  Psychiatric/Behavioral:         Please refer to HPI    Medications: I have reviewed the patient's current medications.  Current Outpatient Medications  Medication Sig Dispense Refill   FLUoxetine (PROZAC) 40 MG capsule Take one capsule every day. 90 capsule 1   Galcanezumab-gnlm (EMGALITY) 120 MG/ML SOAJ Inject 120 mg into the skin every 30 (thirty) days. 3 mL 3   LORazepam (ATIVAN) 0.5 MG tablet Take one tablet twice daily as needed for anxiety and panic attacks. 60 tablet 2   oxybutynin (DITROPAN) 5 MG tablet Take 1 tablet (5 mg total) by mouth every 8 (eight) hours as needed for bladder spasms. (Patient not taking: Reported on 10/19/2022) 30 tablet 0   propranolol (INDERAL) 20 MG tablet TAKE 1 TABLET BY MOUTH EVERY DAY 90 tablet 3   topiramate (TOPAMAX) 50 MG tablet Take 1 tablet by mouth once daily 90 tablet 3   traZODone (DESYREL) 50 MG tablet TAKE ONE TO 3 TABLETS AT BEDTIME FOR SLEEP. 270 tablet 0   Ubrogepant (UBRELVY) 100 MG TABS Take 100 mg by mouth  at onset of migraine. May repeat in 2 hours if needed. Do not exceed 200 mg (2 tablets) in 24 hours. 10 tablet 3   No current facility-administered medications for this visit.    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, blistering peeling skin   Nsaids Dermatitis    Reaction similar to Stephens-Johnson, blistering peeling rash   Sulfa Antibiotics  Swelling    Facial swelling    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   Angio-edema 04/27/2016   Angioedema    Aquagenic angio-edema-urticaria    Asthma    no problems /not used recently   Dysautonomia ([place])    Dysrhythmia    sinus tachycardia   Fibroid    right breast, adenoma   Headache(784.0)    Hives 12/22/2015   Pneumonia    hx  6th grade   Vasculitis (HCC)     Past Medical History, Surgical history, Social history, and Family history were reviewed and updated as appropriate.   Please see review of systems for further details on the patient's review from today.   Objective:   Physical Exam:  There were no vitals taken for this visit.  Physical Exam Constitutional:      General: She is not in acute distress. Musculoskeletal:        General: No deformity.  Neurological:     Mental Status: She is alert and oriented to person, place, and time.     Coordination: Coordination normal.  Psychiatric:        Attention and Perception: Attention and perception normal. She does not perceive auditory or visual hallucinations.        Mood and Affect: Mood normal. Mood is not anxious or depressed. Affect is not labile, blunt, angry or inappropriate.        Speech: Speech normal.        Behavior: Behavior normal.        Thought Content: Thought content normal. Thought content is not paranoid or delusional. Thought content does not include homicidal or suicidal ideation. Thought content does not include homicidal or suicidal plan.        Cognition and Memory: Cognition and memory normal.        Judgment: Judgment normal.     Comments: Insight intact     Lab Review:     Component Value Date/Time   NA 139 10/19/2022 1510   NA 142 09/28/2017 1459   K 3.9 10/19/2022 1510   CL 108 10/19/2022 1510   CO2 21 10/19/2022 1510   GLUCOSE 127 (H) 10/19/2022 1510   BUN 12 10/19/2022 1510   BUN 3 (L) 09/28/2017 1459   CREATININE 0.78  10/19/2022 1510   CALCIUM 9.2 10/19/2022 1510   PROT 6.7 11/06/2019 0855   PROT 6.5 09/28/2017 1459   ALBUMIN 4.2 11/06/2019 0855   ALBUMIN 4.0 09/28/2017 1459   AST 11 11/06/2019 0855   ALT 9 11/06/2019 0855   ALKPHOS 43 11/06/2019 0855   BILITOT 0.5 11/06/2019 0855   BILITOT 0.2 09/28/2017 1459   GFRNONAA >60 11/21/2020 1909   GFRAA >60 12/01/2017 1018       Component Value Date/Time   WBC 9.2 10/19/2022 1510   RBC 4.72 10/19/2022 1510   HGB 13.9 10/19/2022 1510   HGB 11.8 09/28/2017 1459   HCT 40.0 10/19/2022 1510   HCT  36.7 09/28/2017 1459   PLT 326.0 10/19/2022 1510   PLT 368 09/28/2017 1459   MCV 84.8 10/19/2022 1510   MCV 88 09/28/2017 1459   MCH 30.0 11/21/2020 1909   MCHC 34.7 10/19/2022 1510   RDW 12.6 10/19/2022 1510   RDW 13.1 09/28/2017 1459   LYMPHSABS 2.8 06/02/2022 0924   LYMPHSABS 2.8 09/28/2017 1459   MONOABS 0.8 06/02/2022 0924   EOSABS 0.1 06/02/2022 0924   EOSABS 0.0 09/28/2017 1459   BASOSABS 0.1 06/02/2022 0924   BASOSABS 0.0 09/28/2017 1459    No results found for: "POCLITH", "LITHIUM"   No results found for: "PHENYTOIN", "PHENOBARB", "VALPROATE", "CBMZ"   .res Assessment: Plan:    Plan:  PDMP reviewed  Ativan 0.'5mg'$  BID prn anxiety  Increase Prozac '30mg'$  to '40mg'$  daily   Trazadone '50mg'$  at hs  NAC '600mg'$  BID  Propranolol for tachycardia since age 58  Genesight testing reviewed.  Time spent with patient was 25 minutes. Greater than 50% of face to face time with patient was spent on counseling and coordination of care.    RTC 4 weeks  Patient advised to contact office with any questions, adverse effects, or acute worsening in signs and symptoms.  Diagnoses and all orders for this visit:  GAD (generalized anxiety disorder) -     LORazepam (ATIVAN) 0.5 MG tablet; Take one tablet twice daily as needed for anxiety and panic attacks.  Obsessive-compulsive disorder, unspecified type -     FLUoxetine (PROZAC) 40 MG capsule; Take  one capsule every day.  Panic disorder -     LORazepam (ATIVAN) 0.5 MG tablet; Take one tablet twice daily as needed for anxiety and panic attacks.  Sleep disturbance     Please see After Visit Summary for patient specific instructions.  Future Appointments  Date Time Provider [place]  12/10/2022 11:30 AM Edrick Kins, DPM [facility]  12/10/2022  3:00 PM Cottle, Lucious Groves, LCSW [facility] None  12/22/2022  5:00 PM Cottle, Bambi G, LCSW [facility] None  12/28/2022  4:00 PM Cottle, Bambi G, LCSW [facility] None  01/14/2023  1:00 PM Cottle, Bambi G, LCSW [facility] None  11/21/2023 10:00 AM Lomax, Amy, NP [facility] None    No orders of the defined types were placed in this encounter.   -------------------------------

## 2022-12-10 NOTE — Progress Notes (Signed)
Chief Complaint  Patient presents with   Hammer Toe    Patient came in today for hammer toes and right foot pain which started 2 weeks ago, rate of pain 2 out of 10 when patient had been walking a lot, X-rays done today,     HPI: 27 y.o. female presenting today as a new patient for evaluation of pain localized to the right foot over the past 2 weeks.  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  Denies a history of injury.  Has not done anything for treatment.  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   Angio-edema 04/27/2016   Angioedema    Aquagenic angio-edema-urticaria    Asthma    no problems /not used recently   Dysautonomia ([place])    Dysrhythmia    sinus tachycardia   Fibroid    right breast, adenoma   Headache(784.0)    Hives 12/22/2015   Pneumonia    hx  6th grade   Vasculitis (HCC)     Past Surgical History:  Procedure Laterality Date   ADENOIDECTOMY     BREAST LUMPECTOMY Right    BREAST LUMPECTOMY Right    MASS EXCISION Right 10/07/2014   Procedure: EXCISION OF RIGHT BREAST MASS;  Surgeon: Jackolyn Confer, MD;  Location: [facility];  Service: General;  Laterality: Right;   TONSILLECTOMY AND ADENOIDECTOMY     TOOTH EXTRACTION      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  Dermatology: Skin is warm, dry and supple bilateral lower extremities. Negative for open lesions or macerations.  Vascular:  Palpable pedal pulses bilaterally. Capillary refill within normal limits.  Negative for any significant edema or erythema  Neurological: Light touch and protective threshold grossly intact  Musculoskeletal Exam: No pedal deformities noted.  Normal ROM.  There is no significant or appreciable tenderness with palpation throughout the foot or ankle right.  There is very minimal reducible hammertoe deformity noted to the lesser digits bilaterally.  I do not believe this is contributing to any of the patient's current symptoms  Radiographic Exam RT foot 12/10/2022:  Normal osseous mineralization. Joint spaces preserved. No fracture/dislocation/boney destruction.    Assessment: 1.  Intermittent generalized foot pain right x 2 weeks   Plan of Care:  1. Patient evaluated. X-Rays reviewed.  2.  Continue wearing good supportive shoes and sneakers.  Clinically I am unable to elicit any focal area of pain or tenderness or symptoms. 3.  Recommend OTC Motrin as needed 4.  Return to clinic as needed      Edrick Kins, DPM [facility]  Dr. Edrick Kins, DPM    [address]  [place], [place] [address]                Office [phone]  Fax [phone]

## 2022-12-12 NOTE — Progress Notes (Signed)
[place] Counselor/Therapist Progress Note  Patient ID: AQUIA HETTINGER, MRN: [id number],    Date:  12/10/2022  Time Spent: 60 minutes  Treatment Type: Conjoint - The patient was seen via video visit.  She gave verbal consent for the session to be on video on web ex..  The patient was in her home alone and therapist was in the office.  Reported Symptoms: anxiety, panic attacks  Mental Status Exam: Appearance:  Casual     Behavior: Appropriate  Motor: Normal  Speech/Language:  Normal Rate  Affect: normal  Mood: angry  Thought process: normal  Thought content:   WNL  Sensory/Perceptual disturbances:   WNL  Orientation: oriented to person, place, time/date, and situation  Attention: Good  Concentration: Good  Memory: WNL  Fund of knowledge:  Good  Insight:   Fair  Judgment:  Good  Impulse Control: Good   Risk Assessment: Danger to Self:  No Self-injurious Behavior: No Danger to Others: No Duty to Warn:no Physical Aggression / Violence:No  Access to Firearms a concern: No  Gang Involvement:No   Subjective: The patient was seen for a conjoint therapy session with her husband via video visit today.  The patient gave verbal permission for the session to be on video on web ex.  The patient was in her home  and therapist was in the office.  The patient's husband was on the video for the majority of the session as well.  The patient reported that she was angry with him because he was not doing anything around the house and he had left for the day and she was very frustrated.  He did come in and then we talked about the need for them to communicate better and also for him to do what he says he is going to do and for her to lighten up on him as far as being angry.  I have some concerns that they will not make it if they do not each try to do something to find a compromise in the middle.  We reviewed some things that she had ask him to do and I recommended that he do some  follow through and finish what he starts.  She is very OCD and he is ADHD and they seem to struggle with how to find middle ground.    Interventions: Cognitive Behavioral Therapy and Assertiveness/Communication,, problem solving, psychoeducation,  EMDR as indicated, Meditation and mindfulness, couples counseling  Diagnosis:GAD (generalized anxiety disorder)  Obsessive-compulsive disorder, unspecified type  Plan: Client Abilities/Strengths  Intelligent, insightful, motivated  Client Treatment Preferences  Outpatient Individual therapy every other week  Client Statement of Needs  " I feel better now, but still need some help with anxiety"  Treatment Level  Outpatient Individual therapy  Symptoms  Frustration and anxiety related to providing oversight and caretaking to an aging, ailing, and dependent  parent.: No Description Entered (Status: improved). Hypervigilance (e.g., feeling constantly on edge,  experiencing concentration difficulties, having trouble falling or staying asleep, exhibiting a general  state of irritability).: No Description Entered (Status: improved). Motor tension (e.g., restlessness,  tiredness, shakiness, muscle tension).: No Description Entered (Status: improved).  Problems Addressed  Anxiety, Phase Of Life Problems, Anxiety  Goals 1. Learn and implement coping skills that result in a reduction of anxiety  and worry, and improved daily functioning. Objective Learn and implement calming skills to reduce overall anxiety and manage anxiety symptoms. Target Date: 2024-08-09Frequency: Weekly Progress: 0 Modality: individual  Related Interventions 1. Teach  the client calming/relaxation skills (e.g., applied relaxation, progressive muscle  relaxation, cue controlled relaxation; mindful breathing; biofeedback) and how to discriminate  better between relaxation and tension; teach the client how to apply these skills to his/her daily  life (e.g., New Directions in  Progressive Muscle Relaxation by Casper Harrison, and  Hazlett-Stevens; Treating Generalized Anxiety Disorder by Rygh and Amparo Bristol). Objective Identify, challenge, and replace biased, fearful self-talk with positive, realistic, and empowering selftalk. Target Date: 2023-05-27 Frequency: weekly Progress: 0 Modality: individual Related Interventions 1. Explore the client's schema and self-talk that mediate his/her fear response; assist him/her in  challenging the biases; replace the distorted messages with reality-based alternatives and  positive, realistic self-talk that will increase his/her self-confidence in coping with irrational  fears (see Cognitive Therapy of Anxiety Disorders by Alison Stalling). Objective Learn and implement problem-solving strategies for realistically addressing worries. Target Date: 2024-08-09Frequency: weekly Progress: 10 Modality: individual 2. Resolve conflicted feelings and adapt to the new life circumstances. Objective Apply problem-solving skills to current circumstances. Target Date: 2023-05-27 Frequency: weekly Progress: 10 Modality: individual Related Interventions 1. Teach the client problem-resolution skills (e.g., defining the problem clearly, brainstorming  multiple solutions, listing the pros and cons of each solution, seeking input from others,  selecting and implementing a plan of action, evaluating outcome, and readjusting plan as  necessary).   3. Stabilize anxiety level while increasing ability to function on a daily  basis. 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  The patient approved this plan.   Mcadoo Muzquiz G Capucine Tryon,  LCSW

## 2022-12-22 ENCOUNTER — Ambulatory Visit: Payer: BC Managed Care – PPO | Admitting: Psychology

## 2022-12-28 ENCOUNTER — Ambulatory Visit (INDEPENDENT_AMBULATORY_CARE_PROVIDER_SITE_OTHER): Payer: BC Managed Care – PPO | Admitting: Psychology

## 2022-12-28 DIAGNOSIS — F411 Generalized anxiety disorder: Secondary | ICD-10-CM | POA: Diagnosis not present

## 2022-12-28 DIAGNOSIS — F429 Obsessive-compulsive disorder, unspecified: Secondary | ICD-10-CM

## 2022-12-29 NOTE — Progress Notes (Signed)
[place] Counselor/Therapist Progress Note  Patient ID: Faith Jordan, MRN: [id number],    Date:  12/28/2022  Time Spent: 60 minutes  Treatment Type:- The patient was seen via video visit.  She gave verbal consent for the session to be on video on web ex..  The patient was in her home alone and therapist was in the office.  Reported Symptoms: anxiety, panic attacks  Mental Status Exam: Appearance:  Casual     Behavior: Appropriate  Motor: Normal  Speech/Language:  Normal Rate  Affect: normal  Mood: pleasant  Thought process: normal  Thought content:   WNL  Sensory/Perceptual disturbances:   WNL  Orientation: oriented to person, place, time/date, and situation  Attention: Good  Concentration: Good  Memory: WNL  Fund of knowledge:  Good  Insight:   Fair  Judgment:  Good  Impulse Control: Good   Risk Assessment: Danger to Self:  No Self-injurious Behavior: No Danger to Others: No Duty to Warn:no Physical Aggression / Violence:No  Access to Firearms a concern: No  Gang Involvement:No   Subjective: The patient was seen for an individual therapy session  via video visit today.  The patient gave verbal permission for the session to be on video on web ex.  The patient was in her home  and therapist was in the office.  The patient reports that she feels like she is doing better.  She does report that her husband seems to be doing better around the house.  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  We talked about how she felt about that and it seems that she feels good about having more days off.  She also is not sad to leave where she is at because she is not having a good relationship with her boss.  This medicine seems to be helping her but she says that the other medicine may have been helping her with her headaches.  I recommended that she follow-up with her medication  providers.  Interventions: Cognitive Behavioral Therapy and Assertiveness/Communication,, problem solving, psychoeducation,  EMDR as indicated, Meditation and mindfulness, couples counseling  Diagnosis:GAD (generalized anxiety disorder)  Obsessive-compulsive disorder, unspecified type  Plan: Client Abilities/Strengths  Intelligent, insightful, motivated  Client Treatment Preferences  Outpatient Individual therapy every other week  Client Statement of Needs  " I feel better now, but still need some help with anxiety"  Treatment Level  Outpatient Individual therapy  Symptoms  Frustration and anxiety related to providing oversight and caretaking to an aging, ailing, and dependent  parent.: No Description Entered (Status: improved). Hypervigilance (e.g., feeling constantly on edge,  experiencing concentration difficulties, having trouble falling or staying asleep, exhibiting a general  state of irritability).: No Description Entered (Status: improved). Motor tension (e.g., restlessness,  tiredness, shakiness, muscle tension).: No Description Entered (Status: improved).  Problems Addressed  Anxiety, Phase Of Life Problems, Anxiety  Goals 1. Learn and implement coping skills that result in a reduction of anxiety  and worry, and improved daily functioning. Objective Learn and implement calming skills to reduce overall anxiety and manage anxiety symptoms. Target Date: 2024-08-09Frequency: Weekly Progress: 0 Modality: individual  Related Interventions 1. Teach the client calming/relaxation skills (e.g., applied relaxation, progressive muscle  relaxation, cue controlled relaxation; mindful breathing; biofeedback) and how to discriminate  better between relaxation and tension; teach the client how to apply these skills to his/her daily  life (e.g., New  Directions in Progressive Muscle Relaxation by Casper Harrison, and  Hazlett-Stevens; Treating Generalized Anxiety Disorder by Rygh and  Amparo Bristol). Objective Identify, challenge, and replace biased, fearful self-talk with positive, realistic, and empowering selftalk. Target Date: 2023-05-27 Frequency: weekly Progress: 0 Modality: individual Related Interventions 1. Explore the client's schema and self-talk that mediate his/her fear response; assist him/her in  challenging the biases; replace the distorted messages with reality-based alternatives and  positive, realistic self-talk that will increase his/her self-confidence in coping with irrational  fears (see Cognitive Therapy of Anxiety Disorders by Alison Stalling). Objective Learn and implement problem-solving strategies for realistically addressing worries. Target Date: 2024-08-09Frequency: weekly Progress: 10 Modality: individual 2. Resolve conflicted feelings and adapt to the new life circumstances. Objective Apply problem-solving skills to current circumstances. Target Date: 2023-05-27 Frequency: weekly Progress: 10 Modality: individual Related Interventions 1. Teach the client problem-resolution skills (e.g., defining the problem clearly, brainstorming  multiple solutions, listing the pros and cons of each solution, seeking input from others,  selecting and implementing a plan of action, evaluating outcome, and readjusting plan as  necessary).   3. Stabilize anxiety level while increasing ability to function on a daily  basis. 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  The patient approved this plan.   Lashaunda Schild G Miroslava Santellan, LCSW

## 2023-01-02 ENCOUNTER — Other Ambulatory Visit: Payer: Self-pay | Admitting: Adult Health

## 2023-01-02 DIAGNOSIS — F429 Obsessive-compulsive disorder, unspecified: Secondary | ICD-10-CM

## 2023-01-11 ENCOUNTER — Encounter: Payer: Self-pay | Admitting: Family Medicine

## 2023-01-13 ENCOUNTER — Other Ambulatory Visit: Payer: Self-pay | Admitting: Adult Health

## 2023-01-13 DIAGNOSIS — F411 Generalized anxiety disorder: Secondary | ICD-10-CM

## 2023-01-14 ENCOUNTER — Ambulatory Visit: Payer: BC Managed Care – PPO | Admitting: Psychology

## 2023-01-20 ENCOUNTER — Ambulatory Visit: Payer: BC Managed Care – PPO | Admitting: Psychology

## 2023-01-20 ENCOUNTER — Ambulatory Visit: Payer: BC Managed Care – PPO | Admitting: Adult Health

## 2023-01-24 NOTE — Telephone Encounter (Signed)
Submitted PA on covermymeds. Key: [id number]. Waiting on determination from [employer].

## 2023-01-25 ENCOUNTER — Other Ambulatory Visit: Payer: Self-pay

## 2023-01-25 ENCOUNTER — Ambulatory Visit: Payer: 59 | Admitting: Psychology

## 2023-01-25 DIAGNOSIS — F411 Generalized anxiety disorder: Secondary | ICD-10-CM

## 2023-01-25 DIAGNOSIS — F429 Obsessive-compulsive disorder, unspecified: Secondary | ICD-10-CM | POA: Diagnosis not present

## 2023-01-25 MED ORDER — DROSPIRENONE-ETHINYL ESTRADIOL 3-0.02 MG PO TABS
1.0000 | ORAL_TABLET | Freq: Every day | ORAL | 1 refills | Status: DC
  Filled 2023-01-25: qty 84, 84d supply, fill #0

## 2023-01-25 NOTE — Telephone Encounter (Signed)
PA determination still pending with insurance

## 2023-01-25 NOTE — Progress Notes (Signed)
[facility] Counselor/Therapist Progress Note  Patient ID: Faith Jordan, MRN: [id number],    Date:  01/25/2023  Time Spent: 60 minutes  Treatment Type:- The patient was seen via video visit.  She gave verbal consent for the session to be on video on web ex..  The patient was in her home alone and therapist was in the office.  Reported Symptoms: anxiety, panic attacks  Mental Status Exam: Appearance:  Casual     Behavior: Appropriate  Motor: Normal  Speech/Language:  Normal Rate  Affect: blunted  Mood: pleasant  Thought process: normal  Thought content:   WNL  Sensory/Perceptual disturbances:   WNL  Orientation: oriented to person, place, time/date, and situation  Attention: Good  Concentration: Good  Memory: WNL  Fund of knowledge:  Good  Insight:   Fair  Judgment:  Good  Impulse Control: Good   Risk Assessment: Danger to Self:  No Self-injurious Behavior: No Danger to Others: No Duty to Warn:no Physical Aggression / Violence:No  Access to Firearms a concern: No  Gang Involvement:No   Subjective: The patient was seen for an individual therapy session  via video visit today.  The patient gave verbal permission for the session to be on video on web ex.  The patient was in her home  and therapist was in the office.  The patient reports that she has not had insurance until this week.  The patient states that has been a difficult few weeks.  She says that her husband left her for about 4 days recently.  We talked about what happened and we talked about her working on herself and him working on himself.  They are going to see a minister for some counseling.  I recommended that he get himself a therapist so that he can work through his own issues.  We talked about her part in the situation and she seems to be very critical of him at times because of her OCD.  I explained to her that we need to work with her on communication and how to say things to him and not set  the situation up for her to be the parent and him to be the child.  Provided supportive therapy and cognitive behavioral therapy.   Interventions: Cognitive Behavioral Therapy and Assertiveness/Communication,, problem solving, psychoeducation,  EMDR as indicated, Meditation and mindfulness, couples counseling  Diagnosis:GAD (generalized anxiety disorder)  Obsessive-compulsive disorder, unspecified type  Plan: Client Abilities/Strengths  Intelligent, insightful, motivated  Client Treatment Preferences  Outpatient Individual therapy every other week  Client Statement of Needs  " I feel better now, but still need some help with anxiety"  Treatment Level  Outpatient Individual therapy  Symptoms  Frustration and anxiety related to providing oversight and caretaking to an aging, ailing, and dependent  parent.: No Description Entered (Status: improved). Hypervigilance (e.g., feeling constantly on edge,  experiencing concentration difficulties, having trouble falling or staying asleep, exhibiting a general  state of irritability).: No Description Entered (Status: improved). Motor tension (e.g., restlessness,  tiredness, shakiness, muscle tension).: No Description Entered (Status: improved).  Problems Addressed  Anxiety, Phase Of Life Problems, Anxiety  Goals 1. Learn and implement coping skills that result in a reduction of anxiety  and worry, and improved daily functioning. Objective Learn and implement calming skills to reduce overall anxiety and manage anxiety symptoms. Target Date: 2024-08-09Frequency: Weekly Progress: 0 Modality: individual  Related Interventions 1. Teach the client calming/relaxation skills (e.g., applied relaxation, progressive muscle  relaxation, cue controlled  relaxation; mindful breathing; biofeedback) and how to discriminate  better between relaxation and tension; teach the client how to apply these skills to his/her daily  life (e.g., New Directions in  Progressive Muscle Relaxation by Marcelyn Ditty, and  Hazlett-Stevens; Treating Generalized Anxiety Disorder by Rygh and Ida Rogue). Objective Identify, challenge, and replace biased, fearful self-talk with positive, realistic, and empowering selftalk. Target Date: 2023-05-27 Frequency: weekly Progress: 0 Modality: individual Related Interventions 1. Explore the client's schema and self-talk that mediate his/her fear response; assist him/her in  challenging the biases; replace the distorted messages with reality-based alternatives and  positive, realistic self-talk that will increase his/her self-confidence in coping with irrational  fears (see Cognitive Therapy of Anxiety Disorders by Laurence Slate). Objective Learn and implement problem-solving strategies for realistically addressing worries. Target Date: 2024-08-09Frequency: weekly Progress: 10 Modality: individual 2. Resolve conflicted feelings and adapt to the new life circumstances. Objective Apply problem-solving skills to current circumstances. Target Date: 2023-05-27 Frequency: weekly Progress: 10 Modality: individual Related Interventions 1. Teach the client problem-resolution skills (e.g., defining the problem clearly, brainstorming  multiple solutions, listing the pros and cons of each solution, seeking input from others,  selecting and implementing a plan of action, evaluating outcome, and readjusting plan as  necessary).   3. Stabilize anxiety level while increasing ability to function on a daily  basis. 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  The patient approved this plan.   Lanyia Jewel G Arionna Hoggard,  LCSW

## 2023-01-26 ENCOUNTER — Telehealth: Payer: Self-pay | Admitting: *Deleted

## 2023-01-26 ENCOUNTER — Other Ambulatory Visit: Payer: Self-pay

## 2023-01-26 NOTE — Telephone Encounter (Signed)
PA denied. Sent appeal electronically on covermymeds. Key: [id number]. Waiting on determination.

## 2023-01-26 NOTE — Telephone Encounter (Signed)
Submitted PA [place] on covermymeds. Key: [id number]. Received instant approval: "The request has been approved. The authorization is effective from 01/25/2023 to 07/28/2023, as long as the member is enrolled in their current health plan. This has been approved for a max daily dosage of 0.53. A written notification letter will follow with additional details."

## 2023-01-27 NOTE — Telephone Encounter (Signed)
Received fax from [employer] that appeal for Emgality approved 01/27/23-02/25/23 for 1ml/30 then 24ml per 30 02/19/23-08/17/23. Appeal ref# [id number]

## 2023-01-28 ENCOUNTER — Other Ambulatory Visit: Payer: Self-pay

## 2023-01-31 ENCOUNTER — Other Ambulatory Visit: Payer: Self-pay

## 2023-02-02 ENCOUNTER — Other Ambulatory Visit: Payer: Self-pay

## 2023-02-02 ENCOUNTER — Encounter: Payer: Self-pay | Admitting: Adult Health

## 2023-02-02 ENCOUNTER — Ambulatory Visit (INDEPENDENT_AMBULATORY_CARE_PROVIDER_SITE_OTHER): Payer: 59 | Admitting: Primary Care

## 2023-02-02 ENCOUNTER — Ambulatory Visit (INDEPENDENT_AMBULATORY_CARE_PROVIDER_SITE_OTHER): Payer: 59 | Admitting: Adult Health

## 2023-02-02 ENCOUNTER — Encounter: Payer: Self-pay | Admitting: Primary Care

## 2023-02-02 VITALS — BP 98/66 | HR 83 | Temp 98.1°F | Ht 67.0 in | Wt 162.0 lb

## 2023-02-02 DIAGNOSIS — H938X3 Other specified disorders of ear, bilateral: Secondary | ICD-10-CM | POA: Insufficient documentation

## 2023-02-02 DIAGNOSIS — F429 Obsessive-compulsive disorder, unspecified: Secondary | ICD-10-CM

## 2023-02-02 DIAGNOSIS — F410 Panic disorder [episodic paroxysmal anxiety] without agoraphobia: Secondary | ICD-10-CM | POA: Diagnosis not present

## 2023-02-02 DIAGNOSIS — G479 Sleep disorder, unspecified: Secondary | ICD-10-CM | POA: Diagnosis not present

## 2023-02-02 DIAGNOSIS — R0981 Nasal congestion: Secondary | ICD-10-CM | POA: Diagnosis not present

## 2023-02-02 DIAGNOSIS — F411 Generalized anxiety disorder: Secondary | ICD-10-CM | POA: Diagnosis not present

## 2023-02-02 HISTORY — DX: Other specified disorders of ear, bilateral: H93.8X3

## 2023-02-02 LAB — POC COVID19 BINAXNOW: SARS Coronavirus 2 Ag: NEGATIVE

## 2023-02-02 MED ORDER — TRAZODONE HCL 50 MG PO TABS
50.0000 mg | ORAL_TABLET | Freq: Every evening | ORAL | 3 refills | Status: DC | PRN
  Filled 2023-02-02 – 2023-08-04 (×2): qty 270, 90d supply, fill #0

## 2023-02-02 MED ORDER — PREDNISONE 20 MG PO TABS: ORAL_TABLET | ORAL | 0 refills | Status: DC

## 2023-02-02 MED ORDER — LORAZEPAM 0.5 MG PO TABS
0.5000 mg | ORAL_TABLET | Freq: Two times a day (BID) | ORAL | 2 refills | Status: DC
  Filled 2023-02-02 – 2023-04-25 (×2): qty 60, 30d supply, fill #0

## 2023-02-02 MED ORDER — FLUOXETINE HCL 40 MG PO CAPS
40.0000 mg | ORAL_CAPSULE | Freq: Every day | ORAL | 3 refills | Status: DC
  Filled 2023-02-02: qty 90, fill #0
  Filled 2023-03-22 – 2023-08-21 (×4): qty 90, 90d supply, fill #0

## 2023-02-02 NOTE — Progress Notes (Signed)
Subjective:    Patient ID: Faith Jordan, female    DOB: 1996/01/28, 27 y.o.   MRN: [id number]  HPI  Faith Jordan is a very pleasant 27 y.o. female with a history of migraines, ANS, anemia, GAD, panic attacks, sinus tachycardia who presents today to discuss nasal congestion.   Symptom onset yesterday with nasal congestion, ear pain, sore throat, pressure in the head. This morning she woke up feeling worse.   She had three fillings two days ago, isn't sure if this caused her symptoms.  She's had chronic ear fullness since January 2024.   She denies fevers, chills, body aches. She has not tested for Covid-19 infection. 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   She's been taking Tylenol, Zyrtec, Xyzal, Sudafed x 1 day, Flonase, and Afrin x 1 day.   BP Readings from Last 3 Encounters:  02/02/23 98/66  11/17/22 112/73  10/19/22 124/82      Review of Systems  Constitutional:  Negative for chills and fever.  HENT:  Positive for congestion, postnasal drip, sinus pressure and sore throat.   Respiratory:  Negative for cough.   Cardiovascular:  Negative for chest pain.         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   Angio-edema 04/27/2016   Angioedema    Aquagenic angio-edema-urticaria    Asthma    no problems /not used recently   Dysautonomia    Dysrhythmia    sinus tachycardia   Fibroid    right breast, adenoma   Headache(784.0)    Hives 12/22/2015   Pneumonia    hx  6th grade   Vasculitis     Social History   Socioeconomic History   Marital status: Significant Other    Spouse name: Not on file   Number of children: Not on file   Years of education: Not on file   Highest education level: Bachelor's degree (e.g., BA, AB, BS)  Occupational History   Not on file  Tobacco Use   Smoking status: Never   Smokeless tobacco: Never  Vaping Use   Vaping Use: Never used  Substance and Sexual Activity    Alcohol use: No   Drug use: No   Sexual activity: Yes  Other Topics Concern   Not on file  Social History Narrative   Lives with fiance   R handed   Caffeine: 1 drink a day   Social Determinants of Health   Financial Resource Strain: Not on file  Food Insecurity: Not on file  Transportation Needs: Not on file  Physical Activity: Not on file  Stress: Not on file  Social Connections: Not on file  Intimate Partner Violence: Not on file    Past Surgical History:  Procedure Laterality Date   ADENOIDECTOMY     BREAST LUMPECTOMY Right    BREAST LUMPECTOMY Right    MASS EXCISION Right 10/07/2014   Procedure: EXCISION OF RIGHT BREAST MASS;  Surgeon: Avel Peace, MD;  Location: [facility] OR;  Service: General;  Laterality: Right;   TONSILLECTOMY AND ADENOIDECTOMY     TOOTH EXTRACTION      Family History  Problem Relation Age of Onset   Depression Mother    Migraines Mother    Narcolepsy Mother    Hypothyroidism Mother    Myasthenia gravis Maternal Grandfather    Breast cancer Maternal Grandmother 41   Asthma Other    Cancer Other  Epilepsy Other    Allergic rhinitis Neg Hx    Angioedema Neg Hx    Atopy Neg Hx    Eczema Neg Hx    Immunodeficiency Neg Hx    Urticaria Neg Hx     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, blistering peeling skin   Nsaids Dermatitis    Reaction similar to Stephens-Johnson, blistering peeling rash   Sulfa Antibiotics Swelling    Facial swelling    Current Outpatient Medications on File Prior to Visit  Medication Sig Dispense Refill   drospirenone-ethinyl estradiol (YAZ) 3-0.02 MG tablet Take 1 tablet by mouth daily. 84 tablet 1   FLUoxetine (PROZAC) 40 MG capsule Take 1 capsule (40 mg total) by mouth daily. 90 capsule 3   Galcanezumab-gnlm (EMGALITY) 120 MG/ML SOAJ Inject 120 mg into the skin every 30 (thirty) days. 3 mL 3   LORazepam (ATIVAN) 0.5 MG tablet Take 1 tablet (0.5  mg total) by mouth 2 (two) times daily as needed for anxiety and panic attacks. 60 tablet 2   propranolol (INDERAL) 20 MG tablet TAKE 1 TABLET BY MOUTH EVERY DAY 90 tablet 3   topiramate (TOPAMAX) 50 MG tablet Take 1 tablet (50 mg total) by mouth daily. 90 tablet 3   traZODone (DESYREL) 50 MG tablet Take 1-3 tablets (50-150 mg total) by mouth at bedtime as needed for sleep. 270 tablet 3   Ubrogepant (UBRELVY) 100 MG TABS Take 100 mg by mouth at onset of migraine. May repeat in 2 hours if needed. Do not exceed 200 mg (2 tablets) in 24 hours. 10 tablet 3   oxybutynin (DITROPAN) 5 MG tablet Take 1 tablet (5 mg total) by mouth every 8 (eight) hours as needed for bladder spasms. (Patient not taking: Reported on 10/19/2022) 30 tablet 0   No current facility-administered medications on file prior to visit.    BP 98/66   Pulse 83   Temp 98.1 F (36.7 C) (Temporal)   Ht  (1.702 m)   Wt 162 lb (73.5 kg)   SpO2 98%   BMI 25.37 kg/m  Objective:   Physical Exam HENT:     Right Ear: Ear canal normal. A middle ear effusion is present. Tympanic membrane is not erythematous, retracted or bulging.     Left Ear: Ear canal normal. A middle ear effusion is present. Tympanic membrane is not erythematous, retracted or bulging.     Nose:     Right Sinus: No maxillary sinus tenderness or frontal sinus tenderness.     Left Sinus: No maxillary sinus tenderness or frontal sinus tenderness.     Mouth/Throat:     Pharynx: No posterior oropharyngeal erythema.  Eyes:     Conjunctiva/sclera: Conjunctivae normal.  Cardiovascular:     Rate and Rhythm: Normal rate and regular rhythm.  Pulmonary:     Effort: Pulmonary effort is normal.     Breath sounds: Normal breath sounds. No wheezing or rales.  Musculoskeletal:     Cervical back: Neck supple.  Lymphadenopathy:     Cervical: No cervical adenopathy.  Skin:    General: Skin is warm and dry.           Assessment & Plan:  Sensation of fullness in both  ears Assessment & Plan: Evidence of fluid in both TM's. No infection.  Discussed to avoid use of Afrin and Sudafed for more than 3 days.  Continue daily antihistamine. Start prednisone 20 mg  tablets. Take 2 tablets by mouth once daily in the morning for 5 days.  Consider ENT referral if no improvement.  Orders: -     predniSONE; Take 2 tablets by mouth once daily in the morning for 5 days.  Dispense: 10 tablet; Refill: 0  Nasal congestion -     POC COVID-19 BinaxNow        Doreene Nest, NP

## 2023-02-02 NOTE — Progress Notes (Signed)
Faith Jordan [id number] 11-Sep-1996 27 y.o.  Subjective:   Patient ID:  Faith Jordan is a 27 y.o. (DOB Oct 14, 1996) female.  Chief Complaint: No chief complaint on file.   HPI Faith Jordan presents to the office today for follow-up of GAD, OCD, panic disorder, and insomnia.  Referred by therapist - Bambi Cottle.  Describes mood today as "ok". Pleasant. Denies tearfulness. Mood symptoms - denies depression and irritability. Reports some situational anxiety. Reports recent panic attacks - "warranted". Denies worry, rumination, and over thinking. Reports some obsessive thoughts and acts. Stating "I feel ok right now". Feels like the Prozac at 40mg  is working well. Improved interest and motivation. Taking medications as prescribed.  Energy levels improved. Active, does not have a regular exercise routine.   Enjoys some usual interests and activities. Married. Lives with husband - 1 dog and 2 cats. Family local. Spending time with family. Started reading a book.  Appetite varies. Weight stable. Sleeps better some nights than others. Averages 8 to 10 hours - vivid dreams. Focus and concentration improved. Completing tasks. Managing aspects of household. 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 Denies SI or HI.  Denies AH or VH. Denies self harm. Denies substance use.  Previous medication trials:  Cymbalta, Gabapentin, Trazadone, Hydroxyzine, Propranolol, Wellbutrin, Lorazepam and Lexapro, Prozac, Zoloft, Prazosin, Effexor   GAD-7    Flowsheet Row Office Visit from 03/18/2020 in [facility]  Total GAD-7 Score 13      PHQ2-9    Flowsheet Row Office Visit from 06/02/2022 in [facility] [place] [facility] Visit from 07/30/2021 in [facility] Visit from 02/03/2021 in [facility] Visit from 04/27/2018 in [facility]  at [facility]  PHQ-2 Total Score 0 0 0 0  PHQ-9 Total Score 3 0 1 --      Flowsheet Row ED from 05/31/2022 in [facility])  C-SSRS RISK CATEGORY No Risk        Review of Systems:  Review of Systems  Musculoskeletal:  Negative for gait problem.  Neurological:  Negative for tremors.  Psychiatric/Behavioral:         Please refer to HPI    Medications: I have reviewed the patient's current medications.  Current Outpatient Medications  Medication Sig Dispense Refill   drospirenone-ethinyl estradiol (YAZ) 3-0.02 MG tablet Take 1 tablet by mouth daily. 84 tablet 1   FLUoxetine (PROZAC) 40 MG capsule Take one capsule every day. 90 capsule 3   Galcanezumab-gnlm (EMGALITY) 120 MG/ML SOAJ Inject 120 mg into the skin every 30 (thirty) days. 3 mL 3   LORazepam (ATIVAN) 0.5 MG tablet Take one tablet twice daily as needed for anxiety and panic attacks. 60 tablet 2   oxybutynin (DITROPAN) 5 MG tablet Take 1 tablet (5 mg total) by mouth every 8 (eight) hours as needed for bladder spasms. (Patient not taking: Reported on 10/19/2022) 30 tablet 0   propranolol (INDERAL) 20 MG tablet TAKE 1 TABLET BY MOUTH EVERY DAY 90 tablet 3   topiramate (TOPAMAX) 50 MG tablet Take 1 tablet (50 mg total) by mouth daily. 90 tablet 3   traZODone (DESYREL) 50 MG tablet TAKE ONE TO 3 TABLETS AT BEDTIME FOR SLEEP. 270 tablet 3   Ubrogepant (UBRELVY) 100 MG TABS Take 100 mg by mouth at onset of migraine.  May repeat in 2 hours if needed. Do not exceed 200 mg (2 tablets) in 24 hours. 10 tablet 3   No current facility-administered medications for this visit.    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, blistering peeling skin   Nsaids Dermatitis    Reaction similar to Stephens-Johnson, blistering peeling rash   Sulfa Antibiotics Swelling    Facial swelling    Past  Medical History:  Diagnosis Date   Acne    Acquired breast deformity 12/22/2015   Angio-edema 04/27/2016   Angioedema    Aquagenic angio-edema-urticaria    Asthma    no problems /not used recently   Dysautonomia (HCC)    Dysrhythmia    sinus tachycardia   Fibroid    right breast, adenoma   Headache(784.0)    Hives 12/22/2015   Pneumonia    hx  6th grade   Vasculitis (HCC)     Past Medical History, Surgical history, Social history, and Family history were reviewed and updated as appropriate.   Please see review of systems for further details on the patient's review from today.   Objective:   Physical Exam:  There were no vitals taken for this visit.  Physical Exam Constitutional:      General: She is not in acute distress. Musculoskeletal:        General: No deformity.  Neurological:     Mental Status: She is alert and oriented to person, place, and time.     Coordination: Coordination normal.  Psychiatric:        Attention and Perception: Attention and perception normal. She does not perceive auditory or visual hallucinations.        Mood and Affect: Mood normal. Mood is not anxious or depressed. Affect is not labile, blunt, angry or inappropriate.        Speech: Speech normal.        Behavior: Behavior normal.        Thought Content: Thought content normal. Thought content is not paranoid or delusional. Thought content does not include homicidal or suicidal ideation. Thought content does not include homicidal or suicidal plan.        Cognition and Memory: Cognition and memory normal.        Judgment: Judgment normal.     Comments: Insight intact     Lab Review:     Component Value Date/Time   NA 139 10/19/2022 1510   NA 142 09/28/2017 1459   K 3.9 10/19/2022 1510   CL 108 10/19/2022 1510   CO2 21 10/19/2022 1510   GLUCOSE 127 (H) 10/19/2022 1510   BUN 12 10/19/2022 1510   BUN 3 (L) 09/28/2017 1459   CREATININE 0.78 10/19/2022 1510   CALCIUM 9.2 10/19/2022  1510   PROT 6.7 11/06/2019 0855   PROT 6.5 09/28/2017 1459   ALBUMIN 4.2 11/06/2019 0855   ALBUMIN 4.0 09/28/2017 1459   AST 11 11/06/2019 0855   ALT 9 11/06/2019 0855   ALKPHOS 43 11/06/2019 0855   BILITOT 0.5 11/06/2019 0855   BILITOT 0.2 09/28/2017 1459   GFRNONAA >60 11/21/2020 1909   GFRAA >60 12/01/2017 1018       Component Value Date/Time   WBC 9.2 10/19/2022 1510   RBC 4.72 10/19/2022 1510   HGB 13.9 10/19/2022 1510   HGB 11.8 09/28/2017 1459   HCT 40.0 10/19/2022 1510   HCT 36.7 09/28/2017 1459  PLT 326.0 10/19/2022 1510   PLT 368 09/28/2017 1459   MCV 84.8 10/19/2022 1510   MCV 88 09/28/2017 1459   MCH 30.0 11/21/2020 1909   MCHC 34.7 10/19/2022 1510   RDW 12.6 10/19/2022 1510   RDW 13.1 09/28/2017 1459   LYMPHSABS 2.8 06/02/2022 0924   LYMPHSABS 2.8 09/28/2017 1459   MONOABS 0.8 06/02/2022 0924   EOSABS 0.1 06/02/2022 0924   EOSABS 0.0 09/28/2017 1459   BASOSABS 0.1 06/02/2022 0924   BASOSABS 0.0 09/28/2017 1459    No results found for: "POCLITH", "LITHIUM"   No results found for: "PHENYTOIN", "PHENOBARB", "VALPROATE", "CBMZ"   .res Assessment: Plan:    Plan:  PDMP reviewed  Ativan 0.5mg  BID prn anxiety - not taking daily Prozac  daily  Trazadone  at hs  NAC  BID  Propranolol for tachycardia since age 59  Genesight testing reviewed.  Time spent with patient was 25 minutes. Greater than 50% of face to face time with patient was spent on counseling and coordination of care.    RTC 3 months  Patient advised to contact office with any questions, adverse effects, or acute worsening in signs and symptoms.  Diagnoses and all orders for this visit:  Sleep disturbance -     traZODone (DESYREL) 50 MG tablet; TAKE ONE TO 3 TABLETS AT BEDTIME FOR SLEEP.  Panic disorder -     LORazepam (ATIVAN) 0.5 MG tablet; Take one tablet twice daily as needed for anxiety and panic attacks.  GAD (generalized anxiety disorder) -     LORazepam  (ATIVAN) 0.5 MG tablet; Take one tablet twice daily as needed for anxiety and panic attacks.  Obsessive-compulsive disorder, unspecified type -     FLUoxetine (PROZAC) 40 MG capsule; Take one capsule every day.     Please see After Visit Summary for patient specific instructions.  Future Appointments  Date Time Provider Department Center  02/02/2023  3:00 PM Doreene Nest, NP [facility]  02/08/2023 10:00 AM Cottle, Bambi G, LCSW [facility] None  11/21/2023 10:00 AM Lomax, Amy, NP [facility] None    No orders of the defined types were placed in this encounter.   -------------------------------

## 2023-02-02 NOTE — Patient Instructions (Signed)
Start prednisone 20 mg tablets. Take 2 tablets by mouth once daily in the morning for 5 days.  Continue your antihistamine and Flonase.  It was a pleasure to see you today!

## 2023-02-02 NOTE — Assessment & Plan Note (Signed)
Evidence of fluid in both TM's. No infection.  Discussed to avoid use of Afrin and Sudafed for more than 3 days.  Continue daily antihistamine. Start prednisone 20 mg tablets. Take 2 tablets by mouth once daily in the morning for 5 days.  Consider ENT referral if no improvement.

## 2023-02-08 ENCOUNTER — Ambulatory Visit (INDEPENDENT_AMBULATORY_CARE_PROVIDER_SITE_OTHER): Payer: 59 | Admitting: Psychology

## 2023-02-08 DIAGNOSIS — F429 Obsessive-compulsive disorder, unspecified: Secondary | ICD-10-CM | POA: Diagnosis not present

## 2023-02-08 DIAGNOSIS — F411 Generalized anxiety disorder: Secondary | ICD-10-CM | POA: Diagnosis not present

## 2023-02-08 NOTE — Progress Notes (Signed)
[facility] Counselor/Therapist Progress Note  Patient ID: Faith Jordan, MRN: [id number],    Date:  02/08/2023  Time Spent: 60 minutes  Treatment Type:- The patient was seen via video visit.  She gave verbal consent for the session to be on video on web ex..  The patient was in her home alone and therapist was in the office.  Reported Symptoms: anxiety, panic attacks  Mental Status Exam: Appearance:  Casual     Behavior: Appropriate  Motor: Normal  Speech/Language:  Normal Rate  Affect: blunted  Mood: sad  Thought process: normal  Thought content:   WNL  Sensory/Perceptual disturbances:   WNL  Orientation: oriented to person, place, time/date, and situation  Attention: Good  Concentration: Good  Memory: WNL  Fund of knowledge:  Good  Insight:   Fair  Judgment:  Good  Impulse Control: Good   Risk Assessment: Danger to Self:  No Self-injurious Behavior: No Danger to Others: No Duty to Warn:no Physical Aggression / Violence:No  Access to Firearms a concern: No  Gang Involvement:No   Subjective: The patient was seen for an individual therapy session  via video visit today.  The patient gave verbal permission for the session to be on video on web ex.  The patient was in her home  and therapist was in the office.  The patient presents with a blunted affect and mood is sad.  The patient reports that she and her husband saw the pastor this weekend and they seem to be doing a little better with their marriage.  The patient talked about feeling guilty because she was not picking up extra shifts because she feels like she needs to be doing this to pay bills off.  I spoke with her specifically about having a little more balance in her life and that it is okay to take a break.  The patient also feels very guilty about her husband leaving and feels bad about not being good enough as the reason for him leaving.  We talked about the fact that he left because he could not  handle things and that yes there are things that she could work on but it does not help her to beat herself up about that.  The patient has lived with the lot of extreme thinking and I believe that this is part of why she is obsessive and her sister has borderline personality disorder order.  We talked about changing her thinking and giving herself permission to not pick up extra shifts right now and talked about changing her self talk.  Interventions: Cognitive Behavioral Therapy and Assertiveness/Communication,, problem solving, psychoeducation,  EMDR as indicated, Meditation and mindfulness, couples counseling  Diagnosis:GAD (generalized anxiety disorder)  Obsessive-compulsive disorder, unspecified type  Plan: Client Abilities/Strengths  Intelligent, insightful, motivated  Client Treatment Preferences  Outpatient Individual therapy every other week  Client Statement of Needs  " I feel better now, but still need some help with anxiety"  Treatment Level  Outpatient Individual therapy  Symptoms  Frustration and anxiety related to providing oversight and caretaking to an aging, ailing, and dependent  parent.: No Description Entered (Status: improved). Hypervigilance (e.g., feeling constantly on edge,  experiencing concentration difficulties, having trouble falling or staying asleep, exhibiting a general  state of irritability).: No Description Entered (Status: improved). Motor tension (e.g., restlessness,  tiredness, shakiness, muscle tension).: No Description Entered (Status: improved).  Problems Addressed  Anxiety, Phase Of Life Problems, Anxiety  Goals 1. Learn and implement  coping skills that result in a reduction of anxiety  and worry, and improved daily functioning. Objective Learn and implement calming skills to reduce overall anxiety and manage anxiety symptoms. Target Date: 2024-08-09Frequency: Weekly Progress: 0 Modality: individual  Related Interventions 1. Teach the client  calming/relaxation skills (e.g., applied relaxation, progressive muscle  relaxation, cue controlled relaxation; mindful breathing; biofeedback) and how to discriminate  better between relaxation and tension; teach the client how to apply these skills to his/her daily  life (e.g., New Directions in Progressive Muscle Relaxation by Marcelyn Ditty, and  Hazlett-Stevens; Treating Generalized Anxiety Disorder by Rygh and Ida Rogue). Objective Identify, challenge, and replace biased, fearful self-talk with positive, realistic, and empowering selftalk. Target Date: 2023-05-27 Frequency: weekly Progress: 0 Modality: individual Related Interventions 1. Explore the client's schema and self-talk that mediate his/her fear response; assist him/her in  challenging the biases; replace the distorted messages with reality-based alternatives and  positive, realistic self-talk that will increase his/her self-confidence in coping with irrational  fears (see Cognitive Therapy of Anxiety Disorders by Laurence Slate). Objective Learn and implement problem-solving strategies for realistically addressing worries. Target Date: 2024-08-09Frequency: weekly Progress: 10 Modality: individual 2. Resolve conflicted feelings and adapt to the new life circumstances. Objective Apply problem-solving skills to current circumstances. Target Date: 2023-05-27 Frequency: weekly Progress: 10 Modality: individual Related Interventions 1. Teach the client problem-resolution skills (e.g., defining the problem clearly, brainstorming  multiple solutions, listing the pros and cons of each solution, seeking input from others,  selecting and implementing a plan of action, evaluating outcome, and readjusting plan as  necessary).   3. Stabilize anxiety level while increasing ability to function on a daily  basis. Diagnosis  300.02 (Generalized anxiety disorder) - Open - [Signifier: n/a]  Axis  none 309.28 (Adjustment disorder  with mixed anxiety and depressed  mood) - Open - [Signifier: n/a]  Adjustment Disorder,  With Anxiety   Conditions For Discharge Achievement of treatment goals and objectives.  The patient approved this plan.   Tashawn Laswell G Ireanna Finlayson, LCSW

## 2023-02-11 ENCOUNTER — Other Ambulatory Visit: Payer: Self-pay

## 2023-02-12 ENCOUNTER — Other Ambulatory Visit: Payer: Self-pay | Admitting: Adult Health

## 2023-02-12 DIAGNOSIS — G479 Sleep disorder, unspecified: Secondary | ICD-10-CM

## 2023-02-15 ENCOUNTER — Other Ambulatory Visit: Payer: Self-pay

## 2023-02-16 ENCOUNTER — Other Ambulatory Visit: Payer: Self-pay

## 2023-02-16 ENCOUNTER — Ambulatory Visit (INDEPENDENT_AMBULATORY_CARE_PROVIDER_SITE_OTHER): Payer: 59 | Admitting: Psychology

## 2023-02-16 DIAGNOSIS — F411 Generalized anxiety disorder: Secondary | ICD-10-CM | POA: Diagnosis not present

## 2023-02-16 DIAGNOSIS — F429 Obsessive-compulsive disorder, unspecified: Secondary | ICD-10-CM

## 2023-02-16 NOTE — Progress Notes (Signed)
[facility] Counselor/Therapist Progress Note  Patient ID: Faith Jordan, MRN: [id number],    Date:  02/16/2023  Time Spent: 60 minutes  Treatment Type:- The patient was seen via video visit.  She gave verbal consent for the session to be on video on web ex..  The patient was in her home alone and therapist was in the office.  Reported Symptoms: anxiety, panic attacks  Mental Status Exam: Appearance:  Casual     Behavior: Appropriate  Motor: Normal  Speech/Language:  Normal Rate  Affect: blunted  Mood: sad  Thought process: normal  Thought content:   WNL  Sensory/Perceptual disturbances:   WNL  Orientation: oriented to person, place, time/date, and situation  Attention: Good  Concentration: Good  Memory: WNL  Fund of knowledge:  Good  Insight:   Fair  Judgment:  Good  Impulse Control: Good   Risk Assessment: Danger to Self:  No Self-injurious Behavior: No Danger to Others: No Duty to Warn:no Physical Aggression / Violence:No  Access to Firearms a concern: No  Gang Involvement:No   Subjective: The patient was seen for an individual therapy session  via video visit today.  The patient gave verbal permission for the session to be on video on web ex.  The patient was in her home  and therapist was in the office.  The patient presents with a blunted affect and mood is sad.  The patient reported that she went to her parent's house over the weekend and got into an argument with them.  She states that her sister was there and her sister is now an EMT.  As we talked about her frustrations related to her sister it became apparent that the dynamic that is going on in her family of origin is that she and her sister are in competition with each other in general.  It also seems that her parents perpetuate the competition and we talked about the dynamic of the patient being the good child and her sister being the bad child and seeking out negative attention.  The patient  has difficulty with having any positive interim actions about her sister at all.  I believe this is related to her feeling frustrated with her sister getting more attention as they were growing up and the competition that they have with 1 another.  The patient's parents do not believe in therapy because therapist at 1 point in time told them they needed to detach somewhat from her sister.  I explained the dynamics that are going on with her family to her and told her that she is likely going to have to detach somewhat from her family system and have less expectations that her family is going to change.  The patient understood the concepts discussed and we will continue to work on helping her understand behaviors that she can do to detach and still have relationship. Interventions: Cognitive Behavioral Therapy and Assertiveness/Communication,, problem solving, psychoeducation,  EMDR as indicated, Meditation and mindfulness, couples counseling  Diagnosis:GAD (generalized anxiety disorder)  Obsessive-compulsive disorder, unspecified type  Plan: Client Abilities/Strengths  Intelligent, insightful, motivated  Client Treatment Preferences  Outpatient Individual therapy every other week  Client Statement of Needs  " I feel better now, but still need some help with anxiety"  Treatment Level  Outpatient Individual therapy  Symptoms  Frustration and anxiety related to providing oversight and caretaking to an aging, ailing, and dependent  parent.: No Description Entered (Status: improved). Hypervigilance (e.g., feeling constantly on  edge,  experiencing concentration difficulties, having trouble falling or staying asleep, exhibiting a general  state of irritability).: No Description Entered (Status: improved). Motor tension (e.g., restlessness,  tiredness, shakiness, muscle tension).: No Description Entered (Status: improved).  Problems Addressed  Anxiety, Phase Of Life Problems, Anxiety  Goals 1. Learn  and implement coping skills that result in a reduction of anxiety  and worry, and improved daily functioning. Objective Learn and implement calming skills to reduce overall anxiety and manage anxiety symptoms. Target Date: 2024-08-09Frequency: Weekly Progress: 0 Modality: individual  Related Interventions 1. Teach the client calming/relaxation skills (e.g., applied relaxation, progressive muscle  relaxation, cue controlled relaxation; mindful breathing; biofeedback) and how to discriminate  better between relaxation and tension; teach the client how to apply these skills to his/her daily  life (e.g., New Directions in Progressive Muscle Relaxation by Marcelyn Ditty, and  Hazlett-Stevens; Treating Generalized Anxiety Disorder by Rygh and Ida Rogue). Objective Identify, challenge, and replace biased, fearful self-talk with positive, realistic, and empowering selftalk. Target Date: 2023-05-27 Frequency: weekly Progress: 0 Modality: individual Related Interventions 1. Explore the client's schema and self-talk that mediate his/her fear response; assist him/her in  challenging the biases; replace the distorted messages with reality-based alternatives and  positive, realistic self-talk that will increase his/her self-confidence in coping with irrational  fears (see Cognitive Therapy of Anxiety Disorders by Laurence Slate). Objective Learn and implement problem-solving strategies for realistically addressing worries. Target Date: 2024-08-09Frequency: weekly Progress: 10 Modality: individual 2. Resolve conflicted feelings and adapt to the new life circumstances. Objective Apply problem-solving skills to current circumstances. Target Date: 2023-05-27 Frequency: weekly Progress: 10 Modality: individual Related Interventions 1. Teach the client problem-resolution skills (e.g., defining the problem clearly, brainstorming  multiple solutions, listing the pros and cons of each solution,  seeking input from others,  selecting and implementing a plan of action, evaluating outcome, and readjusting plan as  necessary).   3. Stabilize anxiety level while increasing ability to function on a daily  basis. 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  The patient approved this plan.   Jeanae Whitmill G Gwenith Tschida, LCSW

## 2023-02-17 ENCOUNTER — Other Ambulatory Visit: Payer: Self-pay

## 2023-03-08 ENCOUNTER — Ambulatory Visit: Payer: 59 | Admitting: Psychology

## 2023-03-17 ENCOUNTER — Ambulatory Visit (INDEPENDENT_AMBULATORY_CARE_PROVIDER_SITE_OTHER): Payer: 59 | Admitting: Psychology

## 2023-03-17 DIAGNOSIS — F411 Generalized anxiety disorder: Secondary | ICD-10-CM

## 2023-03-17 DIAGNOSIS — F429 Obsessive-compulsive disorder, unspecified: Secondary | ICD-10-CM

## 2023-03-17 NOTE — Progress Notes (Signed)
Faith Jordan  Patient ID: Faith Jordan, MRN: [id number],    Date:  03/17/2023  Time Spent: 60 minutes  Treatment Type:- The patient was seen via video visit.  She gave verbal consent for the session to be on video on caregility.  The patient was in her home alone and therapist was in the office.  Reported Symptoms: anxiety, panic attacks  Mental Status Exam: Appearance:  Casual     Behavior: Appropriate  Motor: Normal  Speech/Language:  Normal Rate  Affect: blunted  Mood: sad  Thought process: normal  Thought content:   WNL  Sensory/Perceptual disturbances:   WNL  Orientation: oriented to person, place, time/date, and situation  Attention: Good  Concentration: Good  Memory: WNL  Fund of knowledge:  Good  Insight:   Fair  Judgment:  Good  Impulse Control: Good   Risk Assessment: Danger to Self:  No Self-injurious Behavior: No Danger to Others: No Duty to Warn:no Physical Aggression / Violence:No  Access to Firearms a concern: No  Gang Involvement:No   Subjective: The patient was seen for an individual therapy session  via video visit today.  The patient gave verbal permission for the session to be on video on caregility.  The patient was in her home  and therapist was in the office.  The patient presents with a blunted affect and mood is sad.  The patient reports that her mother has had some problems with her eyes and she has been trying to help her mother go to doctor's appointments and get medications and groceries and that sort of thing.  In doing this she has become overwhelmed by the situation.  We talked about her self talk and she is giving herself a hard time and the messages that she is giving herself is that she is lazy and not a good wife.  We talked about how to change that narrative and that she is going to have to do that herself.  She has a difficult time trying to reframe things for herself.  I explained to  her that she is going to have to work with herself on not being so hard on herself if she cannot get everything done at her house if she is doing things to help her mother.  We talked about the messages that she needs to give herself need to be neutral messages if she cannot do positive messages.  It seems that a lot of the messages she gives herself are related to competition with her sister and brother and her father telling her that she is lazy if she does not get everything done perfectly.  We will continue to try to help patient put these things into practice.  Interventions: Cognitive Behavioral Therapy and Assertiveness/Communication,, problem solving, psychoeducation,  EMDR as indicated, Meditation and mindfulness, couples counseling  Diagnosis:GAD (generalized anxiety disorder)  Obsessive-compulsive disorder, unspecified type  Plan: Client Abilities/Strengths  Intelligent, insightful, motivated  Client Treatment Preferences  Outpatient Individual therapy every other week  Client Statement of Needs  " I feel better now, but still need some help with anxiety"  Treatment Level  Outpatient Individual therapy  Symptoms  Frustration and anxiety related to providing oversight and caretaking to an aging, ailing, and dependent  parent.: No Description Entered (Status: improved). Hypervigilance (e.g., feeling constantly on edge,  experiencing concentration difficulties, having trouble falling or staying asleep, exhibiting a general  state of irritability).: No Description Entered (Status: improved). Motor tension (  e.g., restlessness,  tiredness, shakiness, muscle tension).: No Description Entered (Status: improved).  Problems Addressed  Anxiety, Phase Of Life Problems, Anxiety  Goals 1. Learn and implement coping skills that result in a reduction of anxiety  and worry, and improved daily functioning. Objective Learn and implement calming skills to reduce overall anxiety and manage anxiety  symptoms. Target Date: 2024-08-09Frequency: Weekly Progress: 0 Modality: individual  Related Interventions 1. Teach the client calming/relaxation skills (e.g., applied relaxation, progressive muscle  relaxation, cue controlled relaxation; mindful breathing; biofeedback) and how to discriminate  better between relaxation and tension; teach the client how to apply these skills to his/her daily  life (e.g., New Directions in Progressive Muscle Relaxation by Marcelyn Ditty, and  Hazlett-Stevens; Treating Generalized Anxiety Disorder by Rygh and Ida Rogue). Objective Identify, challenge, and replace biased, fearful self-talk with positive, realistic, and empowering selftalk. Target Date: 2023-05-27 Frequency: weekly Progress: 0 Modality: individual Related Interventions 1. Explore the client's schema and self-talk that mediate his/her fear response; assist him/her in  challenging the biases; replace the distorted messages with reality-based alternatives and  positive, realistic self-talk that will increase his/her self-confidence in coping with irrational  fears (see Cognitive Therapy of Anxiety Disorders by Laurence Slate). Objective Learn and implement problem-solving strategies for realistically addressing worries. Target Date: 2024-08-09Frequency: weekly Progress: 10 Modality: individual 2. Resolve conflicted feelings and adapt to the new life circumstances. Objective Apply problem-solving skills to current circumstances. Target Date: 2023-05-27 Frequency: weekly Progress: 10 Modality: individual Related Interventions 1. Teach the client problem-resolution skills (e.g., defining the problem clearly, brainstorming  multiple solutions, listing the pros and cons of each solution, seeking input from others,  selecting and implementing a plan of action, evaluating outcome, and readjusting plan as  necessary).   3. Stabilize anxiety level while increasing ability to function on a  daily  basis. 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  The patient approved this plan.   Alonza Knisley G Tymber Stallings, LCSW

## 2023-03-22 ENCOUNTER — Other Ambulatory Visit: Payer: Self-pay | Admitting: Cardiovascular Disease

## 2023-03-22 ENCOUNTER — Ambulatory Visit (INDEPENDENT_AMBULATORY_CARE_PROVIDER_SITE_OTHER): Payer: 59 | Admitting: Psychology

## 2023-03-22 ENCOUNTER — Other Ambulatory Visit: Payer: Self-pay

## 2023-03-22 DIAGNOSIS — Z3202 Encounter for pregnancy test, result negative: Secondary | ICD-10-CM | POA: Diagnosis not present

## 2023-03-22 DIAGNOSIS — F429 Obsessive-compulsive disorder, unspecified: Secondary | ICD-10-CM

## 2023-03-22 DIAGNOSIS — N760 Acute vaginitis: Secondary | ICD-10-CM | POA: Diagnosis not present

## 2023-03-22 DIAGNOSIS — F411 Generalized anxiety disorder: Secondary | ICD-10-CM

## 2023-03-22 DIAGNOSIS — R109 Unspecified abdominal pain: Secondary | ICD-10-CM | POA: Diagnosis not present

## 2023-03-22 DIAGNOSIS — Z113 Encounter for screening for infections with a predominantly sexual mode of transmission: Secondary | ICD-10-CM | POA: Diagnosis not present

## 2023-03-22 MED ORDER — METRONIDAZOLE 500 MG PO TABS
500.0000 mg | ORAL_TABLET | Freq: Two times a day (BID) | ORAL | 0 refills | Status: DC
  Filled 2023-03-22: qty 14, 7d supply, fill #0

## 2023-03-22 NOTE — Progress Notes (Signed)
                Aven Cegielski G Jayda White, LCSW 

## 2023-03-28 ENCOUNTER — Encounter: Payer: Self-pay | Admitting: Emergency Medicine

## 2023-03-28 ENCOUNTER — Emergency Department
Admission: EM | Admit: 2023-03-28 | Discharge: 2023-03-28 | Disposition: A | Discharge status: Home / Self Care | Payer: 59 | Attending: Emergency Medicine | Admitting: Emergency Medicine

## 2023-03-28 ENCOUNTER — Other Ambulatory Visit: Payer: Self-pay

## 2023-03-28 ENCOUNTER — Emergency Department: Payer: 59

## 2023-03-28 DIAGNOSIS — R110 Nausea: Secondary | ICD-10-CM | POA: Insufficient documentation

## 2023-03-28 DIAGNOSIS — R140 Abdominal distension (gaseous): Secondary | ICD-10-CM | POA: Diagnosis not present

## 2023-03-28 DIAGNOSIS — R1031 Right lower quadrant pain: Secondary | ICD-10-CM | POA: Insufficient documentation

## 2023-03-28 DIAGNOSIS — R102 Pelvic and perineal pain: Secondary | ICD-10-CM | POA: Diagnosis not present

## 2023-03-28 LAB — URINALYSIS, ROUTINE W REFLEX MICROSCOPIC
Bilirubin Urine: NEGATIVE
Glucose, UA: NEGATIVE mg/dL
Hgb urine dipstick: NEGATIVE
Ketones, ur: NEGATIVE mg/dL
Nitrite: NEGATIVE
Protein, ur: NEGATIVE mg/dL
Specific Gravity, Urine: 1.024 (ref 1.005–1.030)
pH: 5 (ref 5.0–8.0)

## 2023-03-28 LAB — CBC WITH DIFFERENTIAL/PLATELET
Abs Immature Granulocytes: 0.04 10*3/uL (ref 0.00–0.07)
Basophils Absolute: 0 10*3/uL (ref 0.0–0.1)
Basophils Relative: 0 %
Eosinophils Absolute: 0.1 10*3/uL (ref 0.0–0.5)
Eosinophils Relative: 1 %
HCT: 44.4 % (ref 36.0–46.0)
Hemoglobin: 15 g/dL (ref 12.0–15.0)
Immature Granulocytes: 0 %
Lymphocytes Relative: 25 %
Lymphs Abs: 2.4 10*3/uL (ref 0.7–4.0)
MCH: 29.1 pg (ref 26.0–34.0)
MCHC: 33.8 g/dL (ref 30.0–36.0)
MCV: 86.2 fL (ref 80.0–100.0)
Monocytes Absolute: 0.7 10*3/uL (ref 0.1–1.0)
Monocytes Relative: 7 %
Neutro Abs: 6.6 10*3/uL (ref 1.7–7.7)
Neutrophils Relative %: 67 %
Platelets: 345 10*3/uL (ref 150–400)
RBC: 5.15 MIL/uL — ABNORMAL HIGH (ref 3.87–5.11)
RDW: 12.9 % (ref 11.5–15.5)
WBC: 9.9 10*3/uL (ref 4.0–10.5)
nRBC: 0 % (ref 0.0–0.2)

## 2023-03-28 LAB — COMPREHENSIVE METABOLIC PANEL
ALT: 15 U/L (ref 0–44)
AST: 16 U/L (ref 15–41)
Albumin: 4.2 g/dL (ref 3.5–5.0)
Alkaline Phosphatase: 53 U/L (ref 38–126)
Anion gap: 9 (ref 5–15)
BUN: 15 mg/dL (ref 6–20)
CO2: 21 mmol/L — ABNORMAL LOW (ref 22–32)
Calcium: 9.5 mg/dL (ref 8.9–10.3)
Chloride: 106 mmol/L (ref 98–111)
Creatinine, Ser: 0.73 mg/dL (ref 0.44–1.00)
GFR, Estimated: >60 mL/min (ref >60)
Glucose, Bld: 92 mg/dL (ref 70–99)
Potassium: 3.7 mmol/L (ref 3.5–5.1)
Sodium: 136 mmol/L (ref 135–145)
Total Bilirubin: 0.6 mg/dL (ref 0.3–1.2)
Total Protein: 7.7 g/dL (ref 6.5–8.1)

## 2023-03-28 LAB — PREGNANCY, URINE: Preg Test, Ur: NEGATIVE

## 2023-03-28 MED ORDER — METOCLOPRAMIDE HCL 5 MG/ML IJ SOLN
10.0000 mg | Freq: Once | INTRAMUSCULAR | Status: AC
  Administered 2023-03-28: 10 mg via INTRAVENOUS
  Filled 2023-03-28: qty 2

## 2023-03-28 MED ORDER — MORPHINE SULFATE (PF) 4 MG/ML IV SOLN: 4.0000 mg | Freq: Once | INTRAVENOUS | Status: DC

## 2023-03-28 MED ORDER — HYDROCODONE-ACETAMINOPHEN 5-325 MG PO TABS
1.0000 | ORAL_TABLET | Freq: Four times a day (QID) | ORAL | 0 refills | Status: DC | PRN
  Filled 2023-03-28: qty 15, 4d supply, fill #0

## 2023-03-28 MED ORDER — ONDANSETRON HCL 4 MG/2ML IJ SOLN
4.0000 mg | Freq: Once | INTRAMUSCULAR | Status: AC
  Administered 2023-03-28: 4 mg via INTRAVENOUS
  Filled 2023-03-28: qty 2

## 2023-03-28 MED ORDER — ONDANSETRON 4 MG PO TBDP
4.0000 mg | ORAL_TABLET | Freq: Three times a day (TID) | ORAL | 0 refills | Status: DC | PRN
  Filled 2023-03-28: qty 20, 7d supply, fill #0

## 2023-03-28 MED ORDER — KETOROLAC TROMETHAMINE 30 MG/ML IJ SOLN
15.0000 mg | Freq: Once | INTRAMUSCULAR | Status: AC
  Administered 2023-03-28: 15 mg via INTRAVENOUS
  Filled 2023-03-28: qty 1

## 2023-03-28 NOTE — ED Notes (Signed)
See triage note  Presents with some right sided abd pain  States she developed pain about a week or so ago  States she was seen and placed on Flagyl  and is scheduled to see gyn tomorrow  No fever  Positive nausea

## 2023-03-28 NOTE — ED Provider Notes (Signed)
[facility] Provider Note    None    (approximate)   History   Abdominal Pain   HPI  Faith Jordan is a 27 y.o. female with no significant past medical history presents emergency department with right lower quadrant abdominal pain.  Been intermittent for 2 weeks but worsening today.  Feeling nauseated secondary to pain.  Was seen by her OB/GYN and told she had bacterial vaginosis even though testing was negative.  Has been on Flagyl.  No vomiting or diarrhea.  No recent pregnancy.  Patient did have her IUD removed and is on birth control pills.  Concerns for endometriosis per her OB/GYN.      Physical Exam   Triage Vital Signs: ED Triage Vitals [03/28/23 1241]  Enc Vitals Group     BP      Pulse      Resp      Temp      Temp src      SpO2      Weight 162 lb 2.7 oz (73.6 kg)     Height 5\' 7"  (1.702 m)     Head Circumference      Peak Flow      Pain Score 5     Pain Loc      Pain Edu?      Excl. in GC?     Most recent vital signs: Vitals:   03/28/23 1245  BP: (!) 138/92  Pulse: 67  Resp: 16  Temp: 98.3 F (36.8 C)  SpO2: 99%     General: Awake, no distress.   CV:  Good peripheral perfusion. regular rate and  rhythm Resp:  Normal effort.  Abd:  No distention tender in the right lower quadrant, bowel sounds normal Other:      ED Results / Procedures / Treatments   Labs (all labs ordered are listed, but only abnormal results are displayed) Labs Reviewed  COMPREHENSIVE METABOLIC PANEL - Abnormal; Notable for the following components:      Result Value   CO2 21 (*)    All other components within normal limits  CBC WITH DIFFERENTIAL/PLATELET - Abnormal; Notable for the following components:   RBC 5.15 (*)    All other components within normal limits  URINALYSIS, ROUTINE W REFLEX MICROSCOPIC - Abnormal; Notable for the following components:   Color, Urine YELLOW (*)    APPearance CLEAR (*)    Leukocytes,Ua MODERATE (*)     Bacteria, UA RARE (*)    All other components within normal limits  PREGNANCY, URINE     EKG     RADIOLOGY CT renal stone    PROCEDURES:   Procedures   MEDICATIONS ORDERED IN ED: Medications  ondansetron (ZOFRAN) injection 4 mg (4 mg Intravenous Given 03/28/23 1250)  metoCLOPramide (REGLAN) injection 10 mg (10 mg Intravenous Given 03/28/23 1315)  ketorolac (TORADOL) 30 MG/ML injection 15 mg (15 mg Intravenous Given 03/28/23 1446)     IMPRESSION / MDM / ASSESSMENT AND PLAN / ED COURSE  I reviewed the triage vital signs and the nursing notes.                              Differential diagnosis includes, but is not limited to, acute appendicitis, ovarian cyst, ectopic pregnancy, endometriosis  Patient's presentation is most consistent with acute presentation with potential threat to life or bodily function.   Due to the right lower quadrant  pain worsening and patient being tender in the right lower quadrant we will go ahead and do a CT renal stone.  Labs ordered  Patient's labs are reassuring  CT renal stone study was independently reviewed interpreted by me as being negative for any acute abnormality  The patient was given Toradol 15 mg IV, Zofran and Reglan for nausea.  States she is feeling better after the medication.  I did offer to do ultrasound patient has an appointment with her GYN doctor tomorrow would like to wait.  She was given strict instructions to return if worsening.  I explained to her that the intermittent pain she is having could be a intermittent torsion of the right ovary.  She is in agreement treatment plan.  Discharged in stable condition.      FINAL CLINICAL IMPRESSION(S) / ED DIAGNOSES   Final diagnoses:  Pelvic pain in female  RLQ abdominal pain     Rx / DC Orders   ED Discharge Orders          Ordered    ondansetron (ZOFRAN-ODT) 4 MG disintegrating tablet  Every 8 hours PRN        03/28/23 1435    HYDROcodone-acetaminophen  (NORCO/VICODIN) 5-325 MG tablet  Every 6 hours PRN        03/28/23 1435             Note:  This document was prepared using Dragon voice recognition software and may include unintentional dictation errors.    Faythe Ghee, PA-C 03/28/23 Orland Penman, MD 03/29/23 1426

## 2023-03-28 NOTE — ED Triage Notes (Signed)
C/O right lower quadrant pain x 2 weeks.  Initially had vaginal bleeding with symptoms.  Also c/o abdominal bloating and nausea.  Seen by GYN and given a coarse of flagyl with no improvement in symptoms.

## 2023-03-28 NOTE — ED Notes (Signed)
Back from ct   States she feels better  Family at bedside

## 2023-03-29 ENCOUNTER — Other Ambulatory Visit: Payer: Self-pay

## 2023-03-29 DIAGNOSIS — R102 Pelvic and perineal pain: Secondary | ICD-10-CM | POA: Diagnosis not present

## 2023-03-29 MED ORDER — NORETHINDRONE 0.35 MG PO TABS
1.0000 | ORAL_TABLET | Freq: Every day | ORAL | 3 refills | Status: DC
  Filled 2023-03-29: qty 84, 84d supply, fill #0

## 2023-03-30 ENCOUNTER — Other Ambulatory Visit: Payer: Self-pay

## 2023-04-03 ENCOUNTER — Other Ambulatory Visit: Payer: Self-pay | Admitting: Cardiovascular Disease

## 2023-04-04 ENCOUNTER — Other Ambulatory Visit: Payer: Self-pay

## 2023-04-04 ENCOUNTER — Other Ambulatory Visit: Payer: Self-pay | Admitting: Cardiovascular Disease

## 2023-04-04 MED ORDER — PROPRANOLOL HCL 20 MG PO TABS
20.0000 mg | ORAL_TABLET | Freq: Every day | ORAL | 0 refills | Status: DC
  Filled 2023-04-04: qty 30, 30d supply, fill #0

## 2023-04-05 ENCOUNTER — Other Ambulatory Visit: Payer: Self-pay

## 2023-04-15 ENCOUNTER — Ambulatory Visit (INDEPENDENT_AMBULATORY_CARE_PROVIDER_SITE_OTHER): Payer: 59 | Admitting: Psychology

## 2023-04-15 DIAGNOSIS — F411 Generalized anxiety disorder: Secondary | ICD-10-CM | POA: Diagnosis not present

## 2023-04-15 DIAGNOSIS — F429 Obsessive-compulsive disorder, unspecified: Secondary | ICD-10-CM

## 2023-04-15 NOTE — Progress Notes (Signed)
[facility] Counselor/Therapist Progress Note  Patient ID: Faith Jordan, MRN: [id number],    Date:  04/15/2023  Time Spent: 60 minutes  Time in: 10:00  Time out: 11:08   Treatment Type:- The patient was seen via video visit.  She gave verbal consent for the session to be on video on caregility.  The patient was in her home alone and therapist was in the office.  Reported Symptoms: anxiety, panic attacks  Mental Status Exam: Appearance:  Casual     Behavior: Appropriate  Motor: Normal  Speech/Language:  Normal Rate  Affect: blunted  Mood: pleasant  Thought process: normal  Thought content:   WNL  Sensory/Perceptual disturbances:   WNL  Orientation: oriented to person, place, time/date, and situation  Attention: Good  Concentration: Good  Memory: WNL  Fund of knowledge:  Good  Insight:   Fair  Judgment:  Good  Impulse Control: Good   Risk Assessment: Danger to Self:  No Self-injurious Behavior: No Danger to Others: No Duty to Warn:no Physical Aggression / Violence:No  Access to Firearms a concern: No  Gang Involvement:No   Subjective: The patient was seen for an individual therapy session  via video visit today.  The patient gave verbal permission for the session to be on video on caregility and she is aware of the limitations of telehealth..  The patient was in her home  and therapist was in the office.  The patient presents with a blunted affect and mood is agitated and depressed.  I asked the patient what was wrong today and she states that her husband and people are annoying her.  I had to push her further and she basically said that her husband decided he wanted to play golf on Saturday and Sunday after he had committed to spending the day with her on Saturday.  She said that she is angry with him and I asked her if she had communicated that she was disappointed that he was not going to spend the day with her and she had not.  We talked about how to  handle the situation and that she really had 2 choices one was that she could tell her husband that she really wanted him to follow through on his commitment and that he needed to cancel with the golf.  The second choice was to compromise and have let him go play golf in the morning and just planned to spend time with him in the afternoon.  I asked her which option she thought she might want to choose and she said she wants to talk to him about spending the day with her on Saturday.  She states that she is so angry with him she just wants to tell him that she does not want to spend any time with him.  I explained to her that that is probably not going to be going in the right direction for her and that she might want to think about how she communicates with him so that they can do better.  At the end of the session we were talking about her expressing her feelings and I was trying to educate her on better coping strategies and she was being resistant and basically saying that she likes feeling numb and that sometimes being is a good thing.  I confronted her and told her that if she felt that she was handling things okay then she probably did not need to have therapy.  It seems that  she is resistant to making any changes and I explained to her that she would not be seeing me if she did not have some issues that she was needing to work on.  I encouraged her to take care of herself today and to think about what she wants to do about therapy because she is reverting back to old behaviors that are unhealthy and I explained that she is likely to get back into the situation she was in before.  Interventions: Cognitive Behavioral Therapy and Assertiveness/Communication,, problem solving, psychoeducation,  EMDR as indicated, Meditation and mindfulness, couples counseling  Diagnosis:GAD (generalized anxiety disorder)  Obsessive-compulsive disorder, unspecified type  Plan: Client Abilities/Strengths  Intelligent,  insightful, motivated  Client Treatment Preferences  Outpatient Individual therapy every other week  Client Statement of Needs  " I feel better now, but still need some help with anxiety"  Treatment Level  Outpatient Individual therapy  Symptoms  Frustration and anxiety related to providing oversight and caretaking to an aging, ailing, and dependent  parent.: No Description Entered (Status: improved). Hypervigilance (e.g., feeling constantly on edge,  experiencing concentration difficulties, having trouble falling or staying asleep, exhibiting a general  state of irritability).: No Description Entered (Status: improved). Motor tension (e.g., restlessness,  tiredness, shakiness, muscle tension).: No Description Entered (Status: improved).  Problems Addressed  Anxiety, Phase Of Life Problems, Anxiety  Goals 1. Learn and implement coping skills that result in a reduction of anxiety  and worry, and improved daily functioning. Objective Learn and implement calming skills to reduce overall anxiety and manage anxiety symptoms. Target Date: 2025-08-09Frequency: Weekly Progress: 0 Modality: individual  Related Interventions 1. Teach the client calming/relaxation skills (e.g., applied relaxation, progressive muscle  relaxation, cue controlled relaxation; mindful breathing; biofeedback) and how to discriminate  better between relaxation and tension; teach the client how to apply these skills to his/her daily  life (e.g., New Directions in Progressive Muscle Relaxation by Marcelyn Ditty, and  Hazlett-Stevens; Treating Generalized Anxiety Disorder by Rygh and Ida Rogue). Objective Identify, challenge, and replace biased, fearful self-talk with positive, realistic, and empowering selftalk. Target Date: 2024-05-26 Frequency: weekly Progress: 0 Modality: individual Related Interventions 1. Explore the client's schema and self-talk that mediate his/her fear response; assist him/her in   challenging the biases; replace the distorted messages with reality-based alternatives and  positive, realistic self-talk that will increase his/her self-confidence in coping with irrational  fears (see Cognitive Therapy of Anxiety Disorders by Laurence Slate). Objective Learn and implement problem-solving strategies for realistically addressing worries. Target Date: 2025-08-09Frequency: weekly Progress: 10 Modality: individual 2. Resolve conflicted feelings and adapt to the new life circumstances. Objective Apply problem-solving skills to current circumstances. Target Date: 2024-05-26 Frequency: weekly Progress: 10 Modality: individual Related Interventions 1. Teach the client problem-resolution skills (e.g., defining the problem clearly, brainstorming  multiple solutions, listing the pros and cons of each solution, seeking input from others,  selecting and implementing a plan of action, evaluating outcome, and readjusting plan as  necessary).   3. Stabilize anxiety level while increasing ability to function on a daily  basis. 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  The patient approved this plan.   Emmalou Hunger G Ismael Treptow, LCSW

## 2023-04-19 ENCOUNTER — Other Ambulatory Visit: Payer: Self-pay

## 2023-04-25 ENCOUNTER — Other Ambulatory Visit: Payer: Self-pay

## 2023-04-27 ENCOUNTER — Ambulatory Visit (INDEPENDENT_AMBULATORY_CARE_PROVIDER_SITE_OTHER): Payer: 59 | Admitting: Psychology

## 2023-04-27 DIAGNOSIS — F429 Obsessive-compulsive disorder, unspecified: Secondary | ICD-10-CM | POA: Diagnosis not present

## 2023-04-27 DIAGNOSIS — F411 Generalized anxiety disorder: Secondary | ICD-10-CM

## 2023-04-27 NOTE — Progress Notes (Signed)
                Faith Jordan G Malu Pellegrini, LCSW 

## 2023-05-03 ENCOUNTER — Ambulatory Visit: Payer: 59 | Admitting: Psychology

## 2023-05-03 DIAGNOSIS — F411 Generalized anxiety disorder: Secondary | ICD-10-CM

## 2023-05-03 DIAGNOSIS — F429 Obsessive-compulsive disorder, unspecified: Secondary | ICD-10-CM | POA: Diagnosis not present

## 2023-05-03 NOTE — Progress Notes (Unsigned)
                Kierah Goatley G Nakeysha Pasqual, LCSW

## 2023-05-04 ENCOUNTER — Other Ambulatory Visit: Payer: Self-pay

## 2023-05-04 ENCOUNTER — Ambulatory Visit (INDEPENDENT_AMBULATORY_CARE_PROVIDER_SITE_OTHER): Payer: 59 | Admitting: Adult Health

## 2023-05-04 ENCOUNTER — Encounter: Payer: Self-pay | Admitting: Adult Health

## 2023-05-04 DIAGNOSIS — G479 Sleep disorder, unspecified: Secondary | ICD-10-CM

## 2023-05-04 DIAGNOSIS — F411 Generalized anxiety disorder: Secondary | ICD-10-CM

## 2023-05-04 DIAGNOSIS — F410 Panic disorder [episodic paroxysmal anxiety] without agoraphobia: Secondary | ICD-10-CM | POA: Diagnosis not present

## 2023-05-04 MED ORDER — LORAZEPAM 0.5 MG PO TABS
0.5000 mg | ORAL_TABLET | Freq: Two times a day (BID) | ORAL | 2 refills | Status: DC
Start: 2023-05-04 — End: 2023-09-06
  Filled 2023-05-04: qty 60, 30d supply, fill #0

## 2023-05-04 NOTE — Progress Notes (Signed)
Faith Jordan [id number] 1995/12/27 27 y.o.  Subjective:   Patient ID:  Faith Jordan is a 27 y.o. (DOB 12-12-1995) female.  Chief Complaint: No chief complaint on file.   HPI Faith Jordan presents to the office today for follow-up of  GAD, OCD, panic disorder, and insomnia.  Referred by therapist - Bambi Cottle.  Describes mood today as "ok". Pleasant. Denies tearfulness. Mood symptoms - reports depression - more sad than anything. Reports husband left her 2 weeks ago without any reason. Reports some situational anxiety. Reports irritability. Reports recent panic attacks. Reports worry, rumination, and over thinking. Denies obsessive thoughts and acts. Stating "I feel like I'm ok". Feels like medications are helpful. Varying interest and motivation. Taking medications as prescribed.    Energy levels improved. Active, does not have a regular exercise routine.   Enjoys some usual interests and activities. Married. Lives with husband - 1 dog and 2 cats. Family local. Spending time with family. Started reading a book.  Appetite varies. Weight stable. Sleeps better some nights than others. Averages 6 hours during work week and longer when off. Focus and concentration improved. Completing tasks. Managing aspects of household. 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 Denies SI or HI.  Denies AH or VH. Denies self harm. Denies substance use.  Previous medication trials:  Cymbalta, Gabapentin, Trazadone, Hydroxyzine, Propranolol, Wellbutrin, Lorazepam and Lexapro, Prozac, Zoloft, Prazosin, Effexor   GAD-7    Flowsheet Row Office Visit from 03/18/2020 in [facility]  Total GAD-7 Score 13      PHQ2-9    Flowsheet Row Office Visit from 06/02/2022 in [facility] Visit from 07/30/2021 in [facility] Visit from 02/03/2021 in [facility] Visit from 04/27/2018 in [facility]  PHQ-2 Total Score 0 0 0 0  PHQ-9 Total Score 3 0 1 --      Flowsheet Row ED from 05/31/2022 in [facility])  C-SSRS RISK CATEGORY No Risk        Review of Systems:  Review of Systems  Musculoskeletal:  Negative for gait problem.  Neurological:  Negative for tremors.  Psychiatric/Behavioral:         Please refer to HPI    Medications: I have reviewed the patient's current medications.  Current Outpatient Medications  Medication Sig Dispense Refill   drospirenone-ethinyl estradiol (YAZ) 3-0.02 MG tablet Take 1 tablet by mouth daily. 84 tablet 1   FLUoxetine (PROZAC) 40 MG capsule Take 1 capsule (40 mg total) by mouth daily. 90 capsule 3   Galcanezumab-gnlm (EMGALITY) 120 MG/ML SOAJ Inject 120 mg into the skin every 30 (thirty) days. 3 mL 3   HYDROcodone-acetaminophen (NORCO/VICODIN) 5-325 MG tablet Take 1 tablet by mouth every 6 (six) hours as needed for moderate pain. 15 tablet 0   LORazepam (ATIVAN) 0.5 MG tablet Take 1 tablet (0.5 mg total) by mouth 2 (two) times daily as needed for anxiety and panic attacks. 60 tablet 2   metroNIDAZOLE (FLAGYL) 500 MG tablet Take 1 tablet (500 mg total) by mouth 2 (two) times daily for 7 days. 14 tablet 0   norethindrone (MICRONOR) 0.35 MG tablet Take 1 tablet (0.35 mg total) by mouth daily. 84 tablet 3   ondansetron (ZOFRAN-ODT) 4 MG disintegrating tablet Take 1 tablet (4  mg total) by mouth every 8 (eight) hours as needed. 20 tablet 0   oxybutynin (DITROPAN) 5 MG tablet Take 1 tablet (5 mg total) by mouth every 8 (eight) hours as needed for bladder spasms. (Patient not taking: Reported on 10/19/2022) 30 tablet 0   predniSONE (DELTASONE) 20 MG tablet Take 2 tablets by mouth once daily in the morning for 5 days. 10 tablet 0   propranolol (INDERAL) 20 MG tablet Take 1 tablet (20 mg total) by mouth daily. Need Office  visit 30 tablet 0   topiramate (TOPAMAX) 50 MG tablet Take 1 tablet (50 mg total) by mouth daily. 90 tablet 3   traZODone (DESYREL) 50 MG tablet Take 1-3 tablets (50-150 mg total) by mouth at bedtime as needed for sleep. 270 tablet 3   Ubrogepant (UBRELVY) 100 MG TABS Take 100 mg by mouth at onset of migraine. May repeat in 2 hours if needed. Do not exceed 200 mg (2 tablets) in 24 hours. 10 tablet 3   No current facility-administered medications for this visit.    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, blistering peeling skin   Nsaids Dermatitis    Reaction similar to Stephens-Johnson, blistering peeling rash   Sulfa Antibiotics Swelling    Facial swelling    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   Angio-edema 04/27/2016   Angioedema    Aquagenic angio-edema-urticaria    Asthma    no problems /not used recently   Dysautonomia (HCC)    Dysrhythmia    sinus tachycardia   Fibroid    right breast, adenoma   Headache(784.0)    Hives 12/22/2015   Pneumonia    hx  6th grade   Vasculitis (HCC)     Past Medical History, Surgical history, Social history, and Family history were reviewed and updated as appropriate.   Please see review of systems for further details on the patient's review from today.   Objective:   Physical Exam:  There were no vitals taken for this visit.  Physical Exam Constitutional:      General: She is not in acute distress. Musculoskeletal:        General: No deformity.  Neurological:     Mental Status: She is alert and oriented to person, place, and time.     Coordination: Coordination normal.  Psychiatric:        Attention and Perception: Attention and perception normal. She does not perceive auditory or visual hallucinations.        Mood and Affect: Affect is not labile, blunt, angry or  inappropriate.        Speech: Speech normal.        Behavior: Behavior normal.        Thought Content: Thought content normal. Thought content is not paranoid or delusional. Thought content does not include homicidal or suicidal ideation. Thought content does not include homicidal or suicidal plan.        Cognition and Memory: Cognition and memory normal.        Judgment: Judgment normal.     Comments: Insight intact     Lab Review:     Component Value Date/Time   NA 136 03/28/2023 1250   NA 142 09/28/2017 1459   K 3.7 03/28/2023 1250   CL 106 03/28/2023 1250   CO2 21 (L) 03/28/2023 1250  GLUCOSE 92 03/28/2023 1250   BUN 15 03/28/2023 1250   BUN 3 (L) 09/28/2017 1459   CREATININE 0.73 03/28/2023 1250   CALCIUM 9.5 03/28/2023 1250   PROT 7.7 03/28/2023 1250   PROT 6.5 09/28/2017 1459   ALBUMIN 4.2 03/28/2023 1250   ALBUMIN 4.0 09/28/2017 1459   AST 16 03/28/2023 1250   ALT 15 03/28/2023 1250   ALKPHOS 53 03/28/2023 1250   BILITOT 0.6 03/28/2023 1250   BILITOT 0.2 09/28/2017 1459   GFRNONAA >60 03/28/2023 1250   GFRAA >60 12/01/2017 1018       Component Value Date/Time   WBC 9.9 03/28/2023 1250   RBC 5.15 (H) 03/28/2023 1250   HGB 15.0 03/28/2023 1250   HGB 11.8 09/28/2017 1459   HCT 44.4 03/28/2023 1250   HCT 36.7 09/28/2017 1459   PLT 345 03/28/2023 1250   PLT 368 09/28/2017 1459   MCV 86.2 03/28/2023 1250   MCV 88 09/28/2017 1459   MCH 29.1 03/28/2023 1250   MCHC 33.8 03/28/2023 1250   RDW 12.9 03/28/2023 1250   RDW 13.1 09/28/2017 1459   LYMPHSABS 2.4 03/28/2023 1250   LYMPHSABS 2.8 09/28/2017 1459   MONOABS 0.7 03/28/2023 1250   EOSABS 0.1 03/28/2023 1250   EOSABS 0.0 09/28/2017 1459   BASOSABS 0.0 03/28/2023 1250   BASOSABS 0.0 09/28/2017 1459    No results found for: "POCLITH", "LITHIUM"   No results found for: "PHENYTOIN", "PHENOBARB", "VALPROATE", "CBMZ"   .res Assessment: Plan:    Plan:  PDMP reviewed  Ativan 0.5mg  BID prn anxiety - not  taking daily Prozac 40mg  daily   Increase Trazadone 50mg  to 100mg  at hs - difficulties sleeping  NAC 600mg  BID  Propranolol for tachycardia since age 48  Genesight testing reviewed.  Time spent with patient was 25 minutes. Greater than 50% of face to face time with patient was spent on counseling and coordination of care.    RTC 3 months  Patient advised to contact office with any questions, adverse effects, or acute worsening in signs and symptoms.  There are no diagnoses linked to this encounter.   Please see After Visit Summary for patient specific instructions.  Future Appointments  Date Time Provider Department Center  05/12/2023  5:00 PM Cottle, Lynnell Dike, LCSW [facility] None  11/21/2023 10:00 AM Lomax, Amy, NP [facility] None    No orders of the defined types were placed in this encounter.   -------------------------------

## 2023-05-12 ENCOUNTER — Ambulatory Visit (INDEPENDENT_AMBULATORY_CARE_PROVIDER_SITE_OTHER): Payer: 59 | Admitting: Psychology

## 2023-05-12 ENCOUNTER — Ambulatory Visit: Payer: 59 | Admitting: Psychology

## 2023-05-12 DIAGNOSIS — F410 Panic disorder [episodic paroxysmal anxiety] without agoraphobia: Secondary | ICD-10-CM | POA: Diagnosis not present

## 2023-05-12 DIAGNOSIS — F411 Generalized anxiety disorder: Secondary | ICD-10-CM | POA: Diagnosis not present

## 2023-05-12 DIAGNOSIS — F331 Major depressive disorder, recurrent, moderate: Secondary | ICD-10-CM

## 2023-05-12 DIAGNOSIS — Z630 Problems in relationship with spouse or partner: Secondary | ICD-10-CM

## 2023-05-12 NOTE — Progress Notes (Signed)
[facility] Counselor/Therapist Progress Note  Patient ID: Faith Jordan, MRN: [id number],    Date:  05/12/2023  Time Spent: 60 minutes  Time in: 5:00 Time out: 6:00  Treatment Type:- The patient was seen via video visit.  She gave verbal consent for the session to be on video on caregility.  The patient was in her home alone and therapist was in the office.  Reported Symptoms: anxiety, panic attacks  Mental Status Exam: Appearance:  Casual     Behavior: Appropriate  Motor: Normal  Speech/Language:  Normal Rate  Affect: blunted  Mood: depressed  Thought process: normal  Thought content:   WNL  Sensory/Perceptual disturbances:   WNL  Orientation: oriented to person, place, time/date, and situation  Attention: Good  Concentration: Good  Memory: WNL  Fund of knowledge:  Good  Insight:   Fair  Judgment:  Good  Impulse Control: Good   Risk Assessment: Danger to Self:  No Self-injurious Behavior: No Danger to Others: No Duty to Warn:no Physical Aggression / Violence:No  Access to Firearms a concern: No  Gang Involvement:No   Subjective: The patient was seen for an individual therapy session  via video visit today.  The patient gave verbal permission for the session to be on video on caregility and she is aware of the limitations of telehealth..  The patient was in her home  and therapist was in the office.  The patient continues to present presents as very sad and depressed today.  She reports that she did go to the bank with her husband and he seemed happy.  The patient cried throughout the entire session and she reports that she got sick with COVID over the last week.  We talked about how to move through her sadness and that it was normal to for her to have sadness because she apparently left her husband and is not happy about the way things have turned out.  The patient denies any suicidal ideation and continues to take her medication and that has been  increased to help her sleep during this time.  We talked about helping her move through this and not doing catastrophizing of the situation.  She keeps saying that she is never going to find anybody and that she is an old woman now.  I encouraged her to replace those thoughts was something that is more neutral.  I also explained that she is likely to meet somebody else and that she just has to get through this to get to that place.  Interventions: Cognitive Behavioral Therapy and Assertiveness/Communication,, problem solving, psychoeducation,  EMDR as indicated, Meditation and mindfulness,   Diagnosis:Panic disorder  GAD (generalized anxiety disorder)  Marital conflict  Major depressive disorder, recurrent episode, moderate (HCC)  Plan: Client Abilities/Strengths  Intelligent, insightful, motivated  Client Treatment Preferences  Outpatient Individual therapy every other week  Client Statement of Needs  " I feel better now, but still need some help with anxiety"  Treatment Level  Outpatient Individual therapy  Symptoms  Frustration and anxiety related to providing oversight and caretaking to an aging, ailing, and dependent  parent.: No Description Entered (Status: improved). Hypervigilance (e.g., feeling constantly on edge,  experiencing concentration difficulties, having trouble falling or staying asleep, exhibiting a general  state of irritability).: No Description Entered (Status: improved). Motor tension (e.g., restlessness,  tiredness, shakiness, muscle tension).: No Description Entered (Status: improved).  Problems Addressed  Anxiety, Phase Of Life Problems, Anxiety  Goals 1. Learn  and implement coping skills that result in a reduction of anxiety  and worry, and improved daily functioning. Objective Learn and implement calming skills to reduce overall anxiety and manage anxiety symptoms. Target Date: 2025-08-09Frequency: Weekly Progress: 0 Modality: individual  Related  Interventions 1. Teach the client calming/relaxation skills (e.g., applied relaxation, progressive muscle  relaxation, cue controlled relaxation; mindful breathing; biofeedback) and how to discriminate  better between relaxation and tension; teach the client how to apply these skills to his/her daily  life (e.g., New Directions in Progressive Muscle Relaxation by Marcelyn Ditty, and  Hazlett-Stevens; Treating Generalized Anxiety Disorder by Rygh and Ida Rogue). Objective Identify, challenge, and replace biased, fearful self-talk with positive, realistic, and empowering selftalk. Target Date: 2024-05-26 Frequency: weekly Progress: 0 Modality: individual Related Interventions 1. Explore the client's schema and self-talk that mediate his/her fear response; assist him/her in  challenging the biases; replace the distorted messages with reality-based alternatives and  positive, realistic self-talk that will increase his/her self-confidence in coping with irrational  fears (see Cognitive Therapy of Anxiety Disorders by Laurence Slate). Objective Learn and implement problem-solving strategies for realistically addressing worries. Target Date: 2025-08-09Frequency: weekly Progress: 10 Modality: individual 2. Resolve conflicted feelings and adapt to the new life circumstances. Objective Apply problem-solving skills to current circumstances. Target Date: 2024-05-26 Frequency: weekly Progress: 10 Modality: individual Related Interventions 1. Teach the client problem-resolution skills (e.g., defining the problem clearly, brainstorming  multiple solutions, listing the pros and cons of each solution, seeking input from others,  selecting and implementing a plan of action, evaluating outcome, and readjusting plan as  necessary).   3. Stabilize anxiety level while increasing ability to function on a daily  basis. Diagnosis  300.02 (Generalized anxiety disorder) - Open - [Signifier: n/a]  Axis   none 309.28 (Adjustment disorder with mixed anxiety and depressed  mood) - Open - [Signifier: n/a]  Adjustment Disorder,  With Anxiety   Marital conflict  Major Depressive disorder, moderate  Conditions For Discharge Achievement of treatment goals and objectives.  The patient approved this plan.   Brittni Hult G Almond Fitzgibbon, LCSW

## 2023-05-13 ENCOUNTER — Other Ambulatory Visit: Payer: Self-pay

## 2023-05-13 DIAGNOSIS — Z01419 Encounter for gynecological examination (general) (routine) without abnormal findings: Secondary | ICD-10-CM | POA: Diagnosis not present

## 2023-05-13 DIAGNOSIS — Z6826 Body mass index (BMI) 26.0-26.9, adult: Secondary | ICD-10-CM | POA: Diagnosis not present

## 2023-05-13 DIAGNOSIS — N939 Abnormal uterine and vaginal bleeding, unspecified: Secondary | ICD-10-CM | POA: Diagnosis not present

## 2023-05-13 DIAGNOSIS — N946 Dysmenorrhea, unspecified: Secondary | ICD-10-CM | POA: Diagnosis not present

## 2023-05-13 DIAGNOSIS — R102 Pelvic and perineal pain: Secondary | ICD-10-CM | POA: Diagnosis not present

## 2023-05-13 MED ORDER — LEVONORGEST-ETH ESTRAD 91-DAY 0.15-0.03 &0.01 MG PO TABS
1.0000 | ORAL_TABLET | Freq: Every day | ORAL | 3 refills | Status: DC
  Filled 2023-05-13: qty 91, 91d supply, fill #0
  Filled 2023-10-30: qty 91, 91d supply, fill #1

## 2023-05-16 ENCOUNTER — Other Ambulatory Visit: Payer: Self-pay

## 2023-05-17 ENCOUNTER — Other Ambulatory Visit: Payer: Self-pay

## 2023-05-17 ENCOUNTER — Ambulatory Visit (INDEPENDENT_AMBULATORY_CARE_PROVIDER_SITE_OTHER): Payer: 59 | Admitting: Psychology

## 2023-05-17 DIAGNOSIS — Z630 Problems in relationship with spouse or partner: Secondary | ICD-10-CM | POA: Diagnosis not present

## 2023-05-17 DIAGNOSIS — F411 Generalized anxiety disorder: Secondary | ICD-10-CM

## 2023-05-17 NOTE — Progress Notes (Unsigned)
                Bambi G Cottle, LCSW 

## 2023-05-24 ENCOUNTER — Ambulatory Visit (INDEPENDENT_AMBULATORY_CARE_PROVIDER_SITE_OTHER): Payer: 59 | Admitting: Psychology

## 2023-05-24 DIAGNOSIS — Z630 Problems in relationship with spouse or partner: Secondary | ICD-10-CM

## 2023-05-24 DIAGNOSIS — F411 Generalized anxiety disorder: Secondary | ICD-10-CM

## 2023-05-24 NOTE — Progress Notes (Unsigned)
                Faith Jordan G Laelah Siravo, LCSW 

## 2023-06-01 ENCOUNTER — Ambulatory Visit (INDEPENDENT_AMBULATORY_CARE_PROVIDER_SITE_OTHER): Payer: 59 | Admitting: Psychology

## 2023-06-01 DIAGNOSIS — F429 Obsessive-compulsive disorder, unspecified: Secondary | ICD-10-CM

## 2023-06-01 DIAGNOSIS — F411 Generalized anxiety disorder: Secondary | ICD-10-CM

## 2023-06-01 DIAGNOSIS — Z630 Problems in relationship with spouse or partner: Secondary | ICD-10-CM

## 2023-06-01 NOTE — Progress Notes (Unsigned)
                Kenta Laster G Oronde Hallenbeck, LCSW

## 2023-06-06 ENCOUNTER — Other Ambulatory Visit: Payer: Self-pay

## 2023-06-06 ENCOUNTER — Ambulatory Visit (INDEPENDENT_AMBULATORY_CARE_PROVIDER_SITE_OTHER): Payer: 59 | Admitting: Adult Health

## 2023-06-06 ENCOUNTER — Encounter: Payer: Self-pay | Admitting: Adult Health

## 2023-06-06 DIAGNOSIS — G479 Sleep disorder, unspecified: Secondary | ICD-10-CM | POA: Diagnosis not present

## 2023-06-06 DIAGNOSIS — F331 Major depressive disorder, recurrent, moderate: Secondary | ICD-10-CM | POA: Diagnosis not present

## 2023-06-06 DIAGNOSIS — F429 Obsessive-compulsive disorder, unspecified: Secondary | ICD-10-CM

## 2023-06-06 DIAGNOSIS — F411 Generalized anxiety disorder: Secondary | ICD-10-CM | POA: Diagnosis not present

## 2023-06-06 MED ORDER — HYDROXYZINE HCL 25 MG PO TABS
25.0000 mg | ORAL_TABLET | Freq: Every evening | ORAL | 5 refills | Status: DC | PRN
Start: 2023-06-06 — End: 2024-01-08
  Filled 2023-06-06: qty 60, 30d supply, fill #0

## 2023-06-06 NOTE — Progress Notes (Signed)
Faith Jordan [id number] 07/01/1996 27 y.o.  Subjective:   Patient ID:  Faith Jordan is a 27 y.o. (DOB [birth year]) female.  Chief Complaint: No chief complaint on file.   HPI Faith Jordan presents to the office today for follow-up of GAD, OCD, panic disorder, and insomnia.  Referred by therapist - Bambi Cottle.  Describes mood today as "ok". Pleasant. Denies tearfulness. Mood symptoms - reports decreased depression - "not happy about my situation". She and husband remain separated. Reports some anxiety about the future. Denies irritability. Denies recent panic attacks. Reports worry, rumination, and over thinking. Denies obsessive thoughts and acts. Stating "I feel like I'm doing better. Feels like medications are helpful. Varying interest and motivation. Taking medications as prescribed.    Energy levels improved. Active, does not have a regular exercise routine. Walking. Enjoys some usual interests and activities. Married, but separated. Lives alone-  1 dog and 2 cats. Family local. Spending time with family. Started reading a book.  Appetite varies. Weight stable. Sleeps better some nights than others - reports difficulties getting to sleep. Averages 6 hours during work week and longer when off. Focus and concentration improved. Completing tasks. Managing aspects of household. 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 Denies SI or HI.  Denies AH or VH. Denies self harm. Denies substance use.  Previous medication trials:  Cymbalta, Gabapentin, Trazadone, Hydroxyzine, Propranolol, Wellbutrin, Lorazepam and Lexapro, Prozac, Zoloft, Prazosin, Effexor   GAD-7    Flowsheet Row Office Visit from 03/18/2020 in [facility]  Total GAD-7 Score 13      PHQ2-9    Flowsheet Row Office Visit from 06/02/2022 in [facility] Visit from 07/30/2021 in [facility] Visit from 02/03/2021 in [facility] Visit from 04/27/2018 in [facility]  PHQ-2 Total Score 0 0 0 0  PHQ-9 Total Score 3 0 1 --      Flowsheet Row ED from 05/31/2022 in [facility]  C-SSRS RISK CATEGORY No Risk        Review of Systems:  Review of Systems  Musculoskeletal:  Negative for gait problem.  Neurological:  Negative for tremors.  Psychiatric/Behavioral:         Please refer to HPI    Medications: I have reviewed the patient's current medications.  Current Outpatient Medications  Medication Sig Dispense Refill   hydrOXYzine (ATARAX) 25 MG tablet Take one to two capsules at bedtime as needed for sleep. 60 tablet 5   drospirenone-ethinyl estradiol (YAZ) 3-0.02 MG tablet Take 1 tablet by mouth daily. 84 tablet 1   FLUoxetine (PROZAC) 40 MG capsule Take 1 capsule (40 mg total) by mouth daily. 90 capsule 3   Galcanezumab-gnlm (EMGALITY) 120 MG/ML SOAJ Inject 120 mg into the skin every 30 (thirty) days. 3 mL 3   HYDROcodone-acetaminophen (NORCO/VICODIN) 5-325 MG tablet Take 1 tablet by mouth every 6 (six) hours as needed for moderate pain. 15 tablet 0   Levonorgestrel-Ethinyl Estradiol (CAMRESE) 0.15-0.03 &0.01 MG tablet Take 1 tablet by mouth daily. 91 tablet 3   LORazepam (ATIVAN) 0.5 MG tablet Take 1 tablet (0.5 mg total) by mouth 2 (two) times daily as needed for anxiety and panic attacks. 60 tablet 2   metroNIDAZOLE (FLAGYL) 500 MG tablet Take 1 tablet (500 mg  total) by mouth 2 (two) times daily for 7 days. 14 tablet 0   norethindrone (MICRONOR) 0.35 MG tablet Take 1 tablet (0.35 mg total) by mouth daily. 84 tablet 3   ondansetron (ZOFRAN-ODT) 4 MG disintegrating tablet Take 1 tablet (4 mg total) by mouth every 8 (eight) hours as needed. 20 tablet 0   oxybutynin (DITROPAN) 5 MG tablet Take 1 tablet (5 mg total) by mouth every 8 (eight) hours as needed  for bladder spasms. (Patient not taking: Reported on 10/19/2022) 30 tablet 0   predniSONE (DELTASONE) 20 MG tablet Take 2 tablets by mouth once daily in the morning for 5 days. 10 tablet 0   propranolol (INDERAL) 20 MG tablet Take 1 tablet (20 mg total) by mouth daily. Need Office visit 30 tablet 0   topiramate (TOPAMAX) 50 MG tablet Take 1 tablet (50 mg total) by mouth daily. 90 tablet 3   traZODone (DESYREL) 50 MG tablet Take 1-3 tablets (50-150 mg total) by mouth at bedtime as needed for sleep. 270 tablet 3   Ubrogepant (UBRELVY) 100 MG TABS Take 100 mg by mouth at onset of migraine. May repeat in 2 hours if needed. Do not exceed 200 mg (2 tablets) in 24 hours. 10 tablet 3   No current facility-administered medications for this visit.    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, blistering peeling skin   Nsaids Dermatitis    Reaction similar to Stephens-Johnson, blistering peeling rash   Sulfa Antibiotics Swelling    Facial swelling    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   Angio-edema 04/27/2016   Angioedema    Aquagenic angio-edema-urticaria    Asthma    no problems /not used recently   Dysautonomia (HCC)    Dysrhythmia    sinus tachycardia   Fibroid    right breast, adenoma   Headache(784.0)    Hives 12/22/2015   Pneumonia    hx  6th grade   Vasculitis (HCC)     Past Medical History, Surgical history, Social history, and Family history were reviewed and updated as appropriate.   Please see review of systems for further details on the patient's review from today.   Objective:   Physical Exam:  There were no vitals taken for this visit.  Physical Exam Constitutional:      General: She is not in acute distress. Musculoskeletal:        General: No deformity.  Neurological:     Mental Status: She is alert and  oriented to person, place, and time.     Coordination: Coordination normal.  Psychiatric:        Attention and Perception: Attention and perception normal. She does not perceive auditory or visual hallucinations.        Mood and Affect: Mood normal. Mood is not anxious or depressed. Affect is not labile, blunt, angry or inappropriate.        Speech: Speech normal.        Behavior: Behavior normal.        Thought Content: Thought content normal. Thought content is not paranoid or delusional. Thought content does not include homicidal or suicidal ideation. Thought content does not include homicidal or suicidal plan.        Cognition and Memory: Cognition and memory normal.        Judgment: Judgment normal.  Comments: Insight intact     Lab Review:     Component Value Date/Time   NA 136 03/28/2023 1250   NA 142 09/28/2017 1459   K 3.7 03/28/2023 1250   CL 106 03/28/2023 1250   CO2 21 (L) 03/28/2023 1250   GLUCOSE 92 03/28/2023 1250   BUN 15 03/28/2023 1250   BUN 3 (L) 09/28/2017 1459   CREATININE 0.73 03/28/2023 1250   CALCIUM 9.5 03/28/2023 1250   PROT 7.7 03/28/2023 1250   PROT 6.5 09/28/2017 1459   ALBUMIN 4.2 03/28/2023 1250   ALBUMIN 4.0 09/28/2017 1459   AST 16 03/28/2023 1250   ALT 15 03/28/2023 1250   ALKPHOS 53 03/28/2023 1250   BILITOT 0.6 03/28/2023 1250   BILITOT 0.2 09/28/2017 1459   GFRNONAA >60 03/28/2023 1250   GFRAA >60 12/01/2017 1018       Component Value Date/Time   WBC 9.9 03/28/2023 1250   RBC 5.15 (H) 03/28/2023 1250   HGB 15.0 03/28/2023 1250   HGB 11.8 09/28/2017 1459   HCT 44.4 03/28/2023 1250   HCT 36.7 09/28/2017 1459   PLT 345 03/28/2023 1250   PLT 368 09/28/2017 1459   MCV 86.2 03/28/2023 1250   MCV 88 09/28/2017 1459   MCH 29.1 03/28/2023 1250   MCHC 33.8 03/28/2023 1250   RDW 12.9 03/28/2023 1250   RDW 13.1 09/28/2017 1459   LYMPHSABS 2.4 03/28/2023 1250   LYMPHSABS 2.8 09/28/2017 1459   MONOABS 0.7 03/28/2023 1250   EOSABS  0.1 03/28/2023 1250   EOSABS 0.0 09/28/2017 1459   BASOSABS 0.0 03/28/2023 1250   BASOSABS 0.0 09/28/2017 1459    No results found for: "POCLITH", "LITHIUM"   No results found for: "PHENYTOIN", "PHENOBARB", "VALPROATE", "CBMZ"   .res Assessment: Plan:    Plan:  PDMP reviewed  Ativan 0.5mg  BID prn anxiety - not taking daily  Prozac 40mg  daily   D/C Trazadone 100mg  at hs - difficulties sleeping  Add Hydroxyzine 25mg  - 1 to 2 at hs  NAC 600mg  BID  Propranolol for tachycardia since age 61  Genesight testing reviewed.  RTC 3 months  Patient advised to contact office with any questions, adverse effects, or acute worsening in signs and symptoms.  Discussed potential benefits, risk, and side effects of benzodiazepines to include potential risk of tolerance and dependence, as well as possible drowsiness.  Advised patient not to drive if experiencing drowsiness and to take lowest possible effective dose to minimize risk of dependence and tolerance.   Diagnoses and all orders for this visit:  Major depressive disorder, recurrent episode, moderate (HCC)  Sleep disturbance -     hydrOXYzine (ATARAX) 25 MG tablet; Take one to two capsules at bedtime as needed for sleep.  GAD (generalized anxiety disorder)  Obsessive-compulsive disorder, unspecified type     Please see After Visit Summary for patient specific instructions.  Future Appointments  Date Time Provider Department Center  06/08/2023 12:00 PM Cottle, Lynnell Dike, LCSW [facility] None  06/15/2023  1:00 PM Cottle, Bambi G, LCSW [facility] None  06/23/2023  1:00 PM Cottle, Bambi G, LCSW [facility] None  11/21/2023 10:00 AM Lomax, Amy, NP [facility] None    No orders of the defined types were placed in this encounter.   -------------------------------

## 2023-06-08 ENCOUNTER — Ambulatory Visit (INDEPENDENT_AMBULATORY_CARE_PROVIDER_SITE_OTHER): Payer: 59 | Admitting: Psychology

## 2023-06-08 DIAGNOSIS — F331 Major depressive disorder, recurrent, moderate: Secondary | ICD-10-CM

## 2023-06-08 DIAGNOSIS — F411 Generalized anxiety disorder: Secondary | ICD-10-CM

## 2023-06-08 NOTE — Progress Notes (Addendum)
[facility] Counselor/Therapist Progress Note  Patient ID: Faith Jordan, MRN: [id number],    Date:  06/08/2023  Time Spent: 55 minutes  Time in: 12:00 Time out: 12:55  Treatment Type:- The patient was seen via video visit.  She gave verbal consent for the session to be on video on caregility.  The patient was in her home alone and therapist was in the office.  Reported Symptoms: anxiety, panic attacks  Mental Status Exam: Appearance:  Casual     Behavior: Appropriate  Motor: Normal  Speech/Language:  Normal Rate  Affect: blunted  Mood: anxious  Thought process: normal  Thought content:   WNL  Sensory/Perceptual disturbances:   WNL  Orientation: oriented to person, place, time/date, and situation  Attention: Good  Concentration: Good  Memory: WNL  Fund of knowledge:  Good  Insight:   Fair  Judgment:  Good  Impulse Control: Good   Risk Assessment: Danger to Self:  No Self-injurious Behavior: No Danger to Others: No Duty to Warn:no Physical Aggression / Violence:No  Access to Firearms a concern: No  Gang Involvement:No   Subjective: The patient was seen for an individual therapy session  via video visit today.  The patient gave verbal permission for the session to be on video on caregility and she is aware of the limitations of telehealth..  The patient was in her home  and therapist was in the office.  The patient presents as a pleasant and a little anxious today.  The patient seems to be doing much better with the situation with her husband leaving her.  She reports that she even feels somewhat relieved that he did it.  The patient stated that she went on her first date over the weekend and that it was okay but she was not really attracted to him because he was very shy and he was not as responsible as she thought he would be.  She did state that she did have a date today and that it was with someone that she met while she was at work.  We talked about how  she is going to handle that and what to look for as she moves forward with trying to establish and developed relationships.  The patient does seem to be doing better and is not quite as anxious.  She did take an Ativan this morning and reports that she has a little bit of anxiety because of the date but feels like she is managing things okay right now.  Interventions: Cognitive Behavioral Therapy and Assertiveness/Communication,, problem solving, psychoeducation,  EMDR as indicated, Meditation and mindfulness,   Diagnosis:Major depressive disorder, recurrent episode, moderate (HCC)  GAD (generalized anxiety disorder)  Plan: Client Abilities/Strengths  Intelligent, insightful, motivated  Client Treatment Preferences  Outpatient Individual therapy every other week  Client Statement of Needs  " I feel better now, but still need some help with anxiety"  Treatment Level  Outpatient Individual therapy  Symptoms  Frustration and anxiety related to providing oversight and caretaking to an aging, ailing, and dependent  parent.: No Description Entered (Status: improved). Hypervigilance (e.g., feeling constantly on edge,  experiencing concentration difficulties, having trouble falling or staying asleep, exhibiting a general  state of irritability).: No Description Entered (Status: improved). Motor tension (e.g., restlessness,  tiredness, shakiness, muscle tension).: No Description Entered (Status: improved).  Problems Addressed  Anxiety, Phase Of Life Problems, Anxiety  Goals 1. Learn and implement coping skills that result in a reduction of anxiety  and worry, and improved daily functioning. Objective Learn and implement calming skills to reduce overall anxiety and manage anxiety symptoms. Target Date: 2025-08-09Frequency: Weekly Progress: 0 Modality: individual  Related Interventions 1. Teach the client calming/relaxation skills (e.g., applied relaxation, progressive muscle  relaxation, cue  controlled relaxation; mindful breathing; biofeedback) and how to discriminate  better between relaxation and tension; teach the client how to apply these skills to his/her daily  life (e.g., New Directions in Progressive Muscle Relaxation by Marcelyn Ditty, and  Hazlett-Stevens; Treating Generalized Anxiety Disorder by Rygh and Ida Rogue). Objective Identify, challenge, and replace biased, fearful self-talk with positive, realistic, and empowering selftalk. Target Date: 2024-05-26 Frequency: weekly Progress: 10 Modality: individual Related Interventions 1. Explore the client's schema and self-talk that mediate his/her fear response; assist him/her in  challenging the biases; replace the distorted messages with reality-based alternatives and  positive, realistic self-talk that will increase his/her self-confidence in coping with irrational  fears (see Cognitive Therapy of Anxiety Disorders by Laurence Slate). Objective Learn and implement problem-solving strategies for realistically addressing worries. Target Date: 2025-08-09Frequency: weekly Progress: 10 Modality: individual 2. Resolve conflicted feelings and adapt to the new life circumstances. Objective Apply problem-solving skills to current circumstances. Target Date: 2024-05-26 Frequency: weekly Progress: 10 Modality: individual Related Interventions 1. Teach the client problem-resolution skills (e.g., defining the problem clearly, brainstorming  multiple solutions, listing the pros and cons of each solution, seeking input from others,  selecting and implementing a plan of action, evaluating outcome, and readjusting plan as  necessary).   3. Stabilize anxiety level while increasing ability to function on a daily  basis. Diagnosis  300.02 (Generalized anxiety disorder) - Open - [Signifier: n/a]  Axis  none 309.28 (Adjustment disorder with mixed anxiety and depressed  mood) - Open - [Signifier: n/a]  Adjustment  Disorder,  With Anxiety   Marital conflict  Major Depressive disorder, moderate  Conditions For Discharge Achievement of treatment goals and objectives.  The patient approved this plan.   Masae Lukacs G Kritika Stukes, LCSW

## 2023-06-15 ENCOUNTER — Ambulatory Visit (INDEPENDENT_AMBULATORY_CARE_PROVIDER_SITE_OTHER): Payer: 59 | Admitting: Psychology

## 2023-06-15 DIAGNOSIS — F429 Obsessive-compulsive disorder, unspecified: Secondary | ICD-10-CM

## 2023-06-15 DIAGNOSIS — F411 Generalized anxiety disorder: Secondary | ICD-10-CM

## 2023-06-15 DIAGNOSIS — F331 Major depressive disorder, recurrent, moderate: Secondary | ICD-10-CM

## 2023-06-15 NOTE — Progress Notes (Unsigned)
                Bambi G Cottle, LCSW 

## 2023-06-23 ENCOUNTER — Ambulatory Visit (INDEPENDENT_AMBULATORY_CARE_PROVIDER_SITE_OTHER): Payer: 59 | Admitting: Psychology

## 2023-06-23 DIAGNOSIS — F429 Obsessive-compulsive disorder, unspecified: Secondary | ICD-10-CM | POA: Diagnosis not present

## 2023-06-23 DIAGNOSIS — F411 Generalized anxiety disorder: Secondary | ICD-10-CM

## 2023-06-23 DIAGNOSIS — F331 Major depressive disorder, recurrent, moderate: Secondary | ICD-10-CM | POA: Diagnosis not present

## 2023-06-23 NOTE — Progress Notes (Signed)
[facility] Counselor/Therapist Progress Note  Patient ID: Faith Jordan, MRN: [id number],    Date:  06/23/2023  Time Spent: 53 minutes  Time in: 1:00 Time out: 1:53  Treatment Type:- The patient was seen via video visit.  She gave verbal consent for the session to be on video on caregility.  The patient was in her home alone and therapist was in the office.  Reported Symptoms: anxiety, panic attacks  Mental Status Exam: Appearance:  Casual     Behavior: Appropriate  Motor: Normal  Speech/Language:  Normal Rate  Affect: blunted  Mood: pleasant  Thought process: normal  Thought content:   WNL  Sensory/Perceptual disturbances:   WNL  Orientation: oriented to person, place, time/date, and situation  Attention: Good  Concentration: Good  Memory: WNL  Fund of knowledge:  Good  Insight:   Fair  Judgment:  Good  Impulse Control: Good   Risk Assessment: Danger to Self:  No Self-injurious Behavior: No Danger to Others: No Duty to Warn:no Physical Aggression / Violence:No  Access to Firearms a concern: No  Gang Involvement:No   Subjective: The patient was seen for an individual therapy session  via video visit today.  The patient gave verbal permission for the session to be on video on caregility and she is aware of the limitations of telehealth..  The patient was in her home  and therapist was in the office.  The patient presents as pleasant and cooperative today.  The patient reports that she has a date again today.  She states that she met him online.  We talked about what she needs to be looking for if she chooses to  date and we talked about the need for her to start thinking about what it is she is looking for in her partner as opposed to just trying to make someone fit in that mold.  The patient wants a relationship and she wants to be married and have children.  I explained to her that her part of the reason that she is not with her husband was because he was  not the person that she needed to be with to meet those goals.  I also explained to her that he was not someone that was going to give her what she needed in the relationship and they likely would have ended up not together anyway.  I encouraged her to make a list of things that she might look for in a partner and also make a list of absolute deal breakers.  We will continue to work with the patient on making good choices moving forward and dealing with where she is at now.  Interventions: Cognitive Behavioral Therapy and Assertiveness/Communication,, problem solving, psychoeducation,  EMDR as indicated, Meditation and mindfulness,   Diagnosis:Major depressive disorder, recurrent episode, moderate (HCC)  GAD (generalized anxiety disorder)  Obsessive-compulsive disorder, unspecified type  Plan: Client Abilities/Strengths  Intelligent, insightful, motivated  Client Treatment Preferences  Outpatient Individual therapy every other week  Client Statement of Needs  " I feel better now, but still need some help with anxiety"  Treatment Level  Outpatient Individual therapy  Symptoms  Frustration and anxiety related to providing oversight and caretaking to an aging, ailing, and dependent  parent.: No Description Entered (Status: improved). Hypervigilance (e.g., feeling constantly on edge,  experiencing concentration difficulties, having trouble falling or staying asleep, exhibiting a general  state of irritability).: No Description Entered (Status: improved). Motor tension (e.g., restlessness,  tiredness, shakiness, muscle  tension).: No Description Entered (Status: improved).  Problems Addressed  Anxiety, Phase Of Life Problems, Anxiety  Goals 1. Learn and implement coping skills that result in a reduction of anxiety  and worry, and improved daily functioning. Objective Learn and implement calming skills to reduce overall anxiety and manage anxiety symptoms. Target Date: 2025-08-09Frequency:  Weekly Progress: 0 Modality: individual  Related Interventions 1. Teach the client calming/relaxation skills (e.g., applied relaxation, progressive muscle  relaxation, cue controlled relaxation; mindful breathing; biofeedback) and how to discriminate  better between relaxation and tension; teach the client how to apply these skills to his/her daily  life (e.g., New Directions in Progressive Muscle Relaxation by Marcelyn Ditty, and  Hazlett-Stevens; Treating Generalized Anxiety Disorder by Rygh and Ida Rogue). Objective Identify, challenge, and replace biased, fearful self-talk with positive, realistic, and empowering selftalk. Target Date: 2024-05-26 Frequency: weekly Progress: 10 Modality: individual Related Interventions 1. Explore the client's schema and self-talk that mediate his/her fear response; assist him/her in  challenging the biases; replace the distorted messages with reality-based alternatives and  positive, realistic self-talk that will increase his/her self-confidence in coping with irrational  fears (see Cognitive Therapy of Anxiety Disorders by Laurence Slate). Objective Learn and implement problem-solving strategies for realistically addressing worries. Target Date: 2025-08-09Frequency: weekly Progress: 10 Modality: individual 2. Resolve conflicted feelings and adapt to the new life circumstances. Objective Apply problem-solving skills to current circumstances. Target Date: 2024-05-26 Frequency: weekly Progress: 20 Modality: individual Related Interventions 1. Teach the client problem-resolution skills (e.g., defining the problem clearly, brainstorming  multiple solutions, listing the pros and cons of each solution, seeking input from others,  selecting and implementing a plan of action, evaluating outcome, and readjusting plan as  necessary).   3. Stabilize anxiety level while increasing ability to function on a daily  basis. Diagnosis  300.02  (Generalized anxiety disorder) - Open - [Signifier: n/a]  Axis  none 309.28 (Adjustment disorder with mixed anxiety and depressed  mood) - Open - [Signifier: n/a]  Adjustment Disorder,  With Anxiety   Marital conflict  Major Depressive disorder, moderate  Conditions For Discharge Achievement of treatment goals and objectives.  The patient approved this plan.   Roselene Gray G Shelisa Fern, LCSW

## 2023-06-27 ENCOUNTER — Telehealth: Payer: 59 | Admitting: Physician Assistant

## 2023-06-27 ENCOUNTER — Other Ambulatory Visit: Payer: Self-pay

## 2023-06-27 DIAGNOSIS — J208 Acute bronchitis due to other specified organisms: Secondary | ICD-10-CM

## 2023-06-27 DIAGNOSIS — B9689 Other specified bacterial agents as the cause of diseases classified elsewhere: Secondary | ICD-10-CM

## 2023-06-27 MED ORDER — PREDNISONE 10 MG PO TABS
ORAL_TABLET | ORAL | 0 refills | Status: AC
Start: 2023-06-27 — End: 2023-07-03
  Filled 2023-06-27: qty 21, 6d supply, fill #0

## 2023-06-27 MED ORDER — AZITHROMYCIN 250 MG PO TABS
ORAL_TABLET | ORAL | 0 refills | Status: AC
Start: 2023-06-27 — End: 2023-07-02
  Filled 2023-06-27: qty 6, 5d supply, fill #0

## 2023-06-27 MED ORDER — ALBUTEROL SULFATE HFA 108 (90 BASE) MCG/ACT IN AERS
1.0000 | INHALATION_SPRAY | Freq: Four times a day (QID) | RESPIRATORY_TRACT | 0 refills | Status: DC | PRN
Start: 2023-06-27 — End: 2023-11-29
  Filled 2023-06-27: qty 6.7, 25d supply, fill #0

## 2023-06-27 NOTE — Progress Notes (Signed)
E-Visit for Cough  We are sorry that you are not feeling well.  Here is how we plan to help!  Based on your presentation I believe you most likely have A cough due to bacteria.  When patients have a fever and a productive cough with a change in color or increased sputum production, we are concerned about bacterial bronchitis.  If left untreated it can progress to pneumonia.  If your symptoms do not improve with your treatment plan it is important that you contact your provider.   I have prescribed Azithromyin 250 mg: two tablets now and then one tablet daily for 4 additonal days    In addition you may use A non-prescription cough medication called Mucinex DM: take 2 tablets every 12 hours.  Prednisone 10 mg daily for 6 days (see taper instructions below)  Directions for 6 day taper: Day 1: 2 tablets before breakfast, 1 after both lunch & dinner and 2 at bedtime Day 2: 1 tab before breakfast, 1 after both lunch & dinner and 2 at bedtime Day 3: 1 tab at each meal & 1 at bedtime Day 4: 1 tab at breakfast, 1 at lunch, 1 at bedtime Day 5: 1 tab at breakfast & 1 tab at bedtime Day 6: 1 tab at breakfast  I have also prescribed Albuterol inhaler Use 1-2 puffs every 6 hours as needed for shortness of breath and wheezing.  From your responses in the eVisit questionnaire you describe inflammation in the upper respiratory tract which is causing a significant cough.  This is commonly called Bronchitis and has four common causes:   Allergies Viral Infections Acid Reflux Bacterial Infection Allergies, viruses and acid reflux are treated by controlling symptoms or eliminating the cause. An example might be a cough caused by taking certain blood pressure medications. You stop the cough by changing the medication. Another example might be a cough caused by acid reflux. Controlling the reflux helps control the cough.  USE OF BRONCHODILATOR ("RESCUE") INHALERS: There is a risk from using your bronchodilator  too frequently.  The risk is that over-reliance on a medication which only relaxes the muscles surrounding the breathing tubes can reduce the effectiveness of medications prescribed to reduce swelling and congestion of the tubes themselves.  Although you feel brief relief from the bronchodilator inhaler, your asthma may actually be worsening with the tubes becoming more swollen and filled with mucus.  This can delay other crucial treatments, such as oral steroid medications. If you need to use a bronchodilator inhaler daily, several times per day, you should discuss this with your provider.  There are probably better treatments that could be used to keep your asthma under control.     HOME CARE Only take medications as instructed by your medical team. Complete the entire course of an antibiotic. Drink plenty of fluids and get plenty of rest. Avoid close contacts especially the very young and the elderly Cover your mouth if you cough or cough into your sleeve. Always remember to wash your hands A steam or ultrasonic humidifier can help congestion.   GET HELP RIGHT AWAY IF: You develop worsening fever. You become short of breath You cough up blood. Your symptoms persist after you have completed your treatment plan MAKE SURE YOU  Understand these instructions. Will watch your condition. Will get help right away if you are not doing well or get worse.    Thank you for choosing an e-visit.  Your e-visit answers were reviewed by a board  certified advanced clinical practitioner to complete your personal care plan. Depending upon the condition, your plan could have included both over the counter or prescription medications.  Please review your pharmacy choice. Make sure the pharmacy is open so you can pick up prescription now. If there is a problem, you may contact your provider through [employer] and have the prescription routed to another pharmacy.  Your safety is important to [place]. If you  have drug allergies check your prescription carefully.   For the next 24 hours you can use MyChart to ask questions about today's visit, request a non-urgent call back, or ask for a work or school excuse. You will get an email in the next two days asking about your experience. I hope that your e-visit has been valuable and will speed your recovery.  I have spent 5 minutes in review of e-visit questionnaire, review and updating patient chart, medical decision making and response to patient.   Margaretann Loveless, PA-C

## 2023-06-29 ENCOUNTER — Other Ambulatory Visit: Payer: Self-pay | Admitting: Cardiovascular Disease

## 2023-07-06 ENCOUNTER — Ambulatory Visit (INDEPENDENT_AMBULATORY_CARE_PROVIDER_SITE_OTHER): Payer: 59 | Admitting: Psychology

## 2023-07-06 DIAGNOSIS — F411 Generalized anxiety disorder: Secondary | ICD-10-CM | POA: Diagnosis not present

## 2023-07-06 DIAGNOSIS — F331 Major depressive disorder, recurrent, moderate: Secondary | ICD-10-CM

## 2023-07-06 DIAGNOSIS — F429 Obsessive-compulsive disorder, unspecified: Secondary | ICD-10-CM | POA: Diagnosis not present

## 2023-07-06 NOTE — Progress Notes (Unsigned)
                Janki Dike G Bayley Hurn, LCSW

## 2023-07-13 ENCOUNTER — Other Ambulatory Visit (HOSPITAL_COMMUNITY): Payer: Self-pay

## 2023-07-20 ENCOUNTER — Ambulatory Visit: Payer: 59 | Admitting: Psychology

## 2023-07-20 DIAGNOSIS — F429 Obsessive-compulsive disorder, unspecified: Secondary | ICD-10-CM

## 2023-07-20 DIAGNOSIS — F411 Generalized anxiety disorder: Secondary | ICD-10-CM

## 2023-07-20 DIAGNOSIS — F331 Major depressive disorder, recurrent, moderate: Secondary | ICD-10-CM

## 2023-07-20 NOTE — Progress Notes (Unsigned)
                Anant Agard G Wanell Lorenzi, LCSW

## 2023-07-27 ENCOUNTER — Ambulatory Visit (INDEPENDENT_AMBULATORY_CARE_PROVIDER_SITE_OTHER): Payer: 59 | Admitting: Psychology

## 2023-07-27 DIAGNOSIS — F429 Obsessive-compulsive disorder, unspecified: Secondary | ICD-10-CM

## 2023-07-27 DIAGNOSIS — F331 Major depressive disorder, recurrent, moderate: Secondary | ICD-10-CM | POA: Diagnosis not present

## 2023-07-27 DIAGNOSIS — F411 Generalized anxiety disorder: Secondary | ICD-10-CM | POA: Diagnosis not present

## 2023-07-27 NOTE — Progress Notes (Signed)
[facility] Counselor/Therapist Progress Note  Patient ID: Faith Jordan, MRN: [id number],    Date:  07/27/2023  Time Spent: 62 minutes  Time in: 4:00 Time out: 5:02  Treatment Type:- The patient was seen via video visit.  She gave verbal consent for the session to be on video on caregility.  The patient was in her home alone and therapist was in the office.  Reported Symptoms: anxiety, panic attacks  Mental Status Exam: Appearance:  Casual     Behavior: Appropriate  Motor: Normal  Speech/Language:  Normal Rate  Affect: blunted  Mood: pleasant  Thought process: normal  Thought content:   WNL  Sensory/Perceptual disturbances:   WNL  Orientation: oriented to person, place, time/date, and situation  Attention: Good  Concentration: Good  Memory: WNL  Fund of knowledge:  Good  Insight:   Fair  Judgment:  Good  Impulse Control: Good   Risk Assessment: Danger to Self:  No Self-injurious Behavior: No Danger to Others: No Duty to Warn:no Physical Aggression / Violence:No  Access to Firearms a concern: No  Gang Involvement:No   Subjective: The patient was seen for an individual therapy session  via video visit today.  The patient gave verbal permission for the session to be on video on caregility and she is aware of the limitations of telehealth..  The patient was in her home  and therapist was in the office.  The patient presents as pleasant and cooperative today.  The patient reports that she is still seeing the guy that she was seeing but she is not happy with only seeing him 1 time a week.  She states that she feels like he must not like her.  We discussed a conversation that she had with him and it seems that she was not very clear about what she wants or her intentions.  She made some comment about not wanting to be a feeling and that she wanted to eventually find me and married them.  In some ways that was clear but she was being more vague and broad than she  needed to be.  We talked about her saying what it is that she wants to happen specifically.  I explained to her that what she wants to happen is likely that she wants to see him more than 1 time a week.  She says that he said that he likes his a long time and we did some practicing of how to state what she wants so that they can have some sort of compromise.  The patient still is focused on whether someone likes her or not and not what she likes and the other person.  I will continue to work with her on communication skills and how to understand herself better so that she knows how to communicate more effectively with whoever she ends up with.  She does seem to be doing well with the loss of her marriage except that she feels insecure about relationships at this point.  Interventions: Cognitive Behavioral Therapy and Assertiveness/Communication,, problem solving, psychoeducation,  EMDR as indicated, Meditation and mindfulness,   Diagnosis:Major depressive disorder, recurrent episode, moderate (HCC)  GAD (generalized anxiety disorder)  Obsessive-compulsive disorder, unspecified type  Plan: Client Abilities/Strengths  Intelligent, insightful, motivated  Client Treatment Preferences  Outpatient Individual therapy every other week  Client Statement of Needs  " I feel better now, but still need some help with anxiety"  Treatment Level  Outpatient Individual therapy  Symptoms  Frustration and anxiety related to providing oversight and caretaking to an aging, ailing, and dependent  parent.: No Description Entered (Status: improved). Hypervigilance (e.g., feeling constantly on edge,  experiencing concentration difficulties, having trouble falling or staying asleep, exhibiting a general  state of irritability).: No Description Entered (Status: improved). Motor tension (e.g., restlessness,  tiredness, shakiness, muscle tension).: No Description Entered (Status: improved).  Problems Addressed  Anxiety,  Phase Of Life Problems, Anxiety  Goals 1. Learn and implement coping skills that result in a reduction of anxiety  and worry, and improved daily functioning. Objective Learn and implement calming skills to reduce overall anxiety and manage anxiety symptoms. Target Date: 2025-08-09Frequency: Weekly Progress: 30 Modality: individual  Related Interventions 1. Teach the client calming/relaxation skills (e.g., applied relaxation, progressive muscle  relaxation, cue controlled relaxation; mindful breathing; biofeedback) and how to discriminate  better between relaxation and tension; teach the client how to apply these skills to his/her daily  life (e.g., New Directions in Progressive Muscle Relaxation by Marcelyn Ditty, and  Hazlett-Stevens; Treating Generalized Anxiety Disorder by Rygh and Ida Rogue). Objective Identify, challenge, and replace biased, fearful self-talk with positive, realistic, and empowering selftalk. Target Date: 2024-05-26 Frequency: weekly Progress: 30 Modality: individual Related Interventions 1. Explore the client's schema and self-talk that mediate his/her fear response; assist him/her in  challenging the biases; replace the distorted messages with reality-based alternatives and  positive, realistic self-talk that will increase his/her self-confidence in coping with irrational  fears (see Cognitive Therapy of Anxiety Disorders by Laurence Slate). Objective Learn and implement problem-solving strategies for realistically addressing worries. Target Date: 2025-08-09Frequency: weekly Progress: 30 Modality: individual 2. Resolve conflicted feelings and adapt to the new life circumstances. Objective Apply problem-solving skills to current circumstances. Target Date: 2024-05-26 Frequency: weekly Progress: 20 Modality: individual Related Interventions 1. Teach the client problem-resolution skills (e.g., defining the problem clearly, brainstorming  multiple  solutions, listing the pros and cons of each solution, seeking input from others,  selecting and implementing a plan of action, evaluating outcome, and readjusting plan as  necessary).   3. Stabilize anxiety level while increasing ability to function on a daily  basis. Diagnosis  300.02 (Generalized anxiety disorder) - Open - [Signifier: n/a]  Axis  none 309.28 (Adjustment disorder with mixed anxiety and depressed  mood) - Open - [Signifier: n/a]  Adjustment Disorder,  With Anxiety   Marital conflict  Major Depressive disorder, moderate  Conditions For Discharge Achievement of treatment goals and objectives.  The patient approved this plan.   Romi Rathel G Shizuo Biskup, LCSW

## 2023-08-03 ENCOUNTER — Telehealth: Payer: Self-pay

## 2023-08-03 ENCOUNTER — Other Ambulatory Visit (HOSPITAL_COMMUNITY): Payer: Self-pay

## 2023-08-03 ENCOUNTER — Ambulatory Visit: Payer: 59 | Admitting: Psychology

## 2023-08-03 DIAGNOSIS — F429 Obsessive-compulsive disorder, unspecified: Secondary | ICD-10-CM

## 2023-08-03 DIAGNOSIS — F331 Major depressive disorder, recurrent, moderate: Secondary | ICD-10-CM

## 2023-08-03 DIAGNOSIS — F411 Generalized anxiety disorder: Secondary | ICD-10-CM

## 2023-08-03 NOTE — Progress Notes (Unsigned)
                Renate Danh G Reshma Hoey, LCSW

## 2023-08-03 NOTE — Telephone Encounter (Signed)
*  [facility]  Pharmacy Patient Advocate Encounter  Received notification from [facility] that Prior Authorization for Ubrelvy 100MG  tablets  has been APPROVED from 08/03/2023 to 08/01/2024. Ran test claim, Copay is $0.00. This test claim was processed through [facility]- copay amounts may vary at other pharmacies due to pharmacy/plan contracts, or as the patient moves through the different stages of their insurance plan.   PA #/Case ID/Reference #: [id number]

## 2023-08-05 ENCOUNTER — Other Ambulatory Visit: Payer: Self-pay

## 2023-08-05 ENCOUNTER — Encounter: Payer: Self-pay | Admitting: Adult Health

## 2023-08-21 ENCOUNTER — Other Ambulatory Visit: Payer: Self-pay

## 2023-08-22 ENCOUNTER — Other Ambulatory Visit: Payer: Self-pay

## 2023-08-24 ENCOUNTER — Telehealth: Payer: Self-pay

## 2023-08-24 ENCOUNTER — Ambulatory Visit (INDEPENDENT_AMBULATORY_CARE_PROVIDER_SITE_OTHER): Payer: 59 | Admitting: Psychology

## 2023-08-24 DIAGNOSIS — F411 Generalized anxiety disorder: Secondary | ICD-10-CM

## 2023-08-24 DIAGNOSIS — Z630 Problems in relationship with spouse or partner: Secondary | ICD-10-CM

## 2023-08-24 DIAGNOSIS — F331 Major depressive disorder, recurrent, moderate: Secondary | ICD-10-CM

## 2023-08-24 DIAGNOSIS — F429 Obsessive-compulsive disorder, unspecified: Secondary | ICD-10-CM

## 2023-08-24 NOTE — Telephone Encounter (Signed)
Pharmacy Patient Advocate Encounter   Received notification from CoverMyMeds that prior authorization for Emgality 120MG /ML auto-injectors (migraine) is required/requested.   Insurance verification completed.   The patient is insured through [facility] .   Per test claim: PA required; PA submitted to above mentioned insurance via CoverMyMeds Key/confirmation #/EOC [id number] Status is pending

## 2023-08-24 NOTE — Progress Notes (Unsigned)
                Harveen Flesch G Cena Bruhn, LCSW

## 2023-08-25 ENCOUNTER — Other Ambulatory Visit: Payer: Self-pay

## 2023-08-29 ENCOUNTER — Encounter: Payer: Self-pay | Admitting: Family Medicine

## 2023-08-29 ENCOUNTER — Other Ambulatory Visit (HOSPITAL_COMMUNITY): Payer: Self-pay

## 2023-08-29 NOTE — Telephone Encounter (Addendum)
Pharmacy Patient Advocate Encounter  Received notification from [facility] that Prior Authorization for Emgality 120MG /ML auto-injectors (migraine) has been APPROVED from 08/26/2023 to 08/24/2024. Ran test claim, Copay is $125.00 per 30DS. This test claim was processed through [facility]- copay amounts may vary at other pharmacies due to pharmacy/plan contracts, or as the patient moves through the different stages of their insurance plan.   PA #/Case ID/Reference #: [id number]

## 2023-09-06 ENCOUNTER — Ambulatory Visit (INDEPENDENT_AMBULATORY_CARE_PROVIDER_SITE_OTHER): Payer: 59 | Admitting: Psychology

## 2023-09-06 ENCOUNTER — Other Ambulatory Visit: Payer: Self-pay

## 2023-09-06 ENCOUNTER — Encounter: Payer: Self-pay | Admitting: Adult Health

## 2023-09-06 ENCOUNTER — Ambulatory Visit: Payer: 59 | Admitting: Adult Health

## 2023-09-06 DIAGNOSIS — F411 Generalized anxiety disorder: Secondary | ICD-10-CM

## 2023-09-06 DIAGNOSIS — F331 Major depressive disorder, recurrent, moderate: Secondary | ICD-10-CM

## 2023-09-06 DIAGNOSIS — F429 Obsessive-compulsive disorder, unspecified: Secondary | ICD-10-CM | POA: Diagnosis not present

## 2023-09-06 DIAGNOSIS — F410 Panic disorder [episodic paroxysmal anxiety] without agoraphobia: Secondary | ICD-10-CM

## 2023-09-06 MED ORDER — LORAZEPAM 0.5 MG PO TABS
0.5000 mg | ORAL_TABLET | Freq: Two times a day (BID) | ORAL | 2 refills | Status: DC
Start: 2023-09-06 — End: 2024-01-28
  Filled 2023-09-06: qty 60, 30d supply, fill #0
  Filled 2023-11-20: qty 60, 30d supply, fill #1

## 2023-09-06 NOTE — Progress Notes (Signed)
Faith Jordan [id number] 12/07/1995 27 y.o.  Subjective:   Patient ID:  Faith Jordan is a 27 y.o. (DOB [birth year]) female.  Chief Complaint: No chief complaint on file.   HPI Faith Jordan presents to the office today for follow-up of GAD, OCD, panic disorder, and insomnia.  Referred by therapist - Bambi Cottle.  Describes mood today as "ok". Pleasant. Reports tearfulness at times. Mood symptoms - reports decreased depression. Denies anxious and irritability. Reports one recent panic attack. Denies worry, rumination, and over thinking. Denies obsessive thoughts and acts. Stating "I feel like I'm doing as good as I can be". Feels like medications are helpful. Varying interest and motivation. Taking medications as prescribed.    Energy levels improved. Active, does not have a regular exercise routine. Walking. Enjoys some usual interests and activities. Married, but separated. Lives alone - 1 dog and 2 cats. Family local. Spending time with family. Appetite varies. Weight stable. Sleeps better some nights than others. Averages 6 or more hours during work week and longer when off. Focus and concentration improved. Completing tasks. Managing aspects of household. 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 Denies SI or HI.  Denies AH or VH. Denies self harm. Denies substance use.  Previous medication trials:  Cymbalta, Gabapentin, Trazadone, Hydroxyzine, Propranolol, Wellbutrin, Lorazepam and Lexapro, Prozac, Zoloft, Prazosin, Effexor     GAD-7    Flowsheet Row Office Visit from 03/18/2020 in [facility]  Total GAD-7 Score 13      PHQ2-9    Flowsheet Row Office Visit from 06/02/2022 in [facility] Visit from 07/30/2021 in [facility] Visit from 02/03/2021 in [facility] Visit from 04/27/2018 in [facility]  PHQ-2 Total Score 0 0 0 0  PHQ-9 Total Score 3 0 1 --      Flowsheet Row ED from 05/31/2022 in [facility])  C-SSRS RISK CATEGORY No Risk        Review of Systems:  Review of Systems  Musculoskeletal:  Negative for gait problem.  Neurological:  Negative for tremors.  Psychiatric/Behavioral:         Please refer to HPI    Medications: I have reviewed the patient's current medications.  Current Outpatient Medications  Medication Sig Dispense Refill   albuterol (VENTOLIN HFA) 108 (90 Base) MCG/ACT inhaler Inhale 1-2 puffs into the lungs every 6 (six) hours as needed. 6.7 g 0   drospirenone-ethinyl estradiol (YAZ) 3-0.02 MG tablet Take 1 tablet by mouth daily. 84 tablet 1   FLUoxetine (PROZAC) 40 MG capsule Take 1 capsule (40 mg total) by mouth daily. 90 capsule 3   Galcanezumab-gnlm (EMGALITY) 120 MG/ML SOAJ Inject 120 mg into the skin every 30 (thirty) days. 3 mL 3   HYDROcodone-acetaminophen (NORCO/VICODIN) 5-325 MG tablet Take 1 tablet by mouth every 6 (six) hours as needed for moderate pain. 15 tablet 0   hydrOXYzine (ATARAX) 25 MG tablet Take 1-2 tablets (25-50 mg total) by mouth at bedtime as needed. 60 tablet 5   Levonorgestrel-Ethinyl Estradiol (CAMRESE) 0.15-0.03 &0.01 MG tablet Take 1 tablet by mouth daily. 91 tablet 3   LORazepam (ATIVAN) 0.5 MG tablet Take 1 tablet (0.5 mg total) by mouth 2 (two) times daily as needed for anxiety and panic attacks. 60 tablet 2  norethindrone (MICRONOR) 0.35 MG tablet Take 1 tablet (0.35 mg total) by mouth daily. 84 tablet 3   ondansetron (ZOFRAN-ODT) 4 MG disintegrating tablet Take 1 tablet (4 mg total) by mouth every 8 (eight) hours as needed. 20 tablet 0   oxybutynin (DITROPAN) 5 MG tablet Take 1 tablet (5 mg total) by mouth every 8 (eight) hours as needed for bladder spasms. (Patient not taking: Reported on 10/19/2022) 30 tablet 0   propranolol (INDERAL) 20 MG  tablet TAKE 1 TABLET BY MOUTH EVERY DAY 90 tablet 3   topiramate (TOPAMAX) 50 MG tablet Take 1 tablet (50 mg total) by mouth daily. 90 tablet 3   traZODone (DESYREL) 50 MG tablet Take 1-3 tablets (50-150 mg total) by mouth at bedtime as needed for sleep. 270 tablet 3   Ubrogepant (UBRELVY) 100 MG TABS Take 100 mg by mouth at onset of migraine. May repeat in 2 hours if needed. Do not exceed 200 mg (2 tablets) in 24 hours. 10 tablet 3   No current facility-administered medications for this visit.    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, blistering peeling skin   Nsaids Dermatitis    Reaction similar to Stephens-Johnson, blistering peeling rash   Sulfa Antibiotics Swelling    Facial swelling    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   Angio-edema 04/27/2016   Angioedema    Aquagenic angio-edema-urticaria    Asthma    no problems /not used recently   Dysautonomia (HCC)    Dysrhythmia    sinus tachycardia   Fibroid    right breast, adenoma   Headache(784.0)    Hives 12/22/2015   Pneumonia    hx  6th grade   Vasculitis (HCC)     Past Medical History, Surgical history, Social history, and Family history were reviewed and updated as appropriate.   Please see review of systems for further details on the patient's review from today.   Objective:   Physical Exam:  There were no vitals taken for this visit.  Physical Exam Constitutional:      General: She is not in acute distress. Musculoskeletal:        General: No deformity.  Neurological:     Mental Status: She is alert and oriented to person, place, and time.     Coordination: Coordination normal.  Psychiatric:        Attention and Perception: Attention and perception normal. She does not perceive auditory or visual hallucinations.        Mood and Affect: Mood normal.  Mood is not anxious or depressed. Affect is not labile, blunt, angry or inappropriate.        Speech: Speech normal.        Behavior: Behavior normal.        Thought Content: Thought content normal. Thought content is not paranoid or delusional. Thought content does not include homicidal or suicidal ideation. Thought content does not include homicidal or suicidal plan.        Cognition and Memory: Cognition and memory normal.        Judgment: Judgment normal.     Comments: Insight intact     Lab Review:     Component Value Date/Time   NA 136 03/28/2023 1250   NA 142 09/28/2017 1459   K 3.7 03/28/2023 1250   CL 106 03/28/2023  1250   CO2 21 (L) 03/28/2023 1250   GLUCOSE 92 03/28/2023 1250   BUN 15 03/28/2023 1250   BUN 3 (L) 09/28/2017 1459   CREATININE 0.73 03/28/2023 1250   CALCIUM 9.5 03/28/2023 1250   PROT 7.7 03/28/2023 1250   PROT 6.5 09/28/2017 1459   ALBUMIN 4.2 03/28/2023 1250   ALBUMIN 4.0 09/28/2017 1459   AST 16 03/28/2023 1250   ALT 15 03/28/2023 1250   ALKPHOS 53 03/28/2023 1250   BILITOT 0.6 03/28/2023 1250   BILITOT 0.2 09/28/2017 1459   GFRNONAA >60 03/28/2023 1250   GFRAA >60 12/01/2017 1018       Component Value Date/Time   WBC 9.9 03/28/2023 1250   RBC 5.15 (H) 03/28/2023 1250   HGB 15.0 03/28/2023 1250   HGB 11.8 09/28/2017 1459   HCT 44.4 03/28/2023 1250   HCT 36.7 09/28/2017 1459   PLT 345 03/28/2023 1250   PLT 368 09/28/2017 1459   MCV 86.2 03/28/2023 1250   MCV 88 09/28/2017 1459   MCH 29.1 03/28/2023 1250   MCHC 33.8 03/28/2023 1250   RDW 12.9 03/28/2023 1250   RDW 13.1 09/28/2017 1459   LYMPHSABS 2.4 03/28/2023 1250   LYMPHSABS 2.8 09/28/2017 1459   MONOABS 0.7 03/28/2023 1250   EOSABS 0.1 03/28/2023 1250   EOSABS 0.0 09/28/2017 1459   BASOSABS 0.0 03/28/2023 1250   BASOSABS 0.0 09/28/2017 1459    No results found for: "POCLITH", "LITHIUM"   No results found for: "PHENYTOIN", "PHENOBARB", "VALPROATE", "CBMZ"   .res Assessment:  Plan:    Plan:  PDMP reviewed  Ativan 0.5mg  BID prn anxiety - not taking daily Prozac 40mg  daily Trazadone 100mg  at hs  Hydroxyzine 25mg  - 1 to 2 at hs  NAC 600mg  BID  Propranolol for tachycardia since age 21  [facility] testing reviewed.  RTC 3 months  Patient advised to contact office with any questions, adverse effects, or acute worsening in signs and symptoms.  Discussed potential benefits, risk, and side effects of benzodiazepines to include potential risk of tolerance and dependence, as well as possible drowsiness.  Advised patient not to drive if experiencing drowsiness and to take lowest possible effective dose to minimize risk of dependence and tolerance.   There are no diagnoses linked to this encounter.   Please see After Visit Summary for patient specific instructions.  Future Appointments  Date Time Provider Department Center  09/06/2023 11:40 AM Happy Begeman, Thereasa Solo, NP [facility] None  09/06/2023  5:00 PM Cottle, Bambi G, LCSW [facility] None  11/21/2023 10:00 AM Lomax, Amy, NP [facility] None    No orders of the defined types were placed in this encounter.   -------------------------------

## 2023-09-06 NOTE — Progress Notes (Unsigned)
                Faith Doxtater G Yan Okray, LCSW

## 2023-09-12 DIAGNOSIS — D225 Melanocytic nevi of trunk: Secondary | ICD-10-CM | POA: Diagnosis not present

## 2023-09-12 DIAGNOSIS — I781 Nevus, non-neoplastic: Secondary | ICD-10-CM | POA: Diagnosis not present

## 2023-10-04 ENCOUNTER — Ambulatory Visit (INDEPENDENT_AMBULATORY_CARE_PROVIDER_SITE_OTHER): Payer: 59 | Admitting: Psychology

## 2023-10-04 DIAGNOSIS — Z630 Problems in relationship with spouse or partner: Secondary | ICD-10-CM

## 2023-10-04 DIAGNOSIS — F330 Major depressive disorder, recurrent, mild: Secondary | ICD-10-CM | POA: Diagnosis not present

## 2023-10-04 DIAGNOSIS — F411 Generalized anxiety disorder: Secondary | ICD-10-CM

## 2023-10-04 NOTE — Progress Notes (Unsigned)
                Riggin Cuttino G Melodye Swor, LCSW

## 2023-10-27 ENCOUNTER — Ambulatory Visit: Payer: 59 | Admitting: Psychology

## 2023-10-30 ENCOUNTER — Other Ambulatory Visit: Payer: Self-pay

## 2023-11-11 ENCOUNTER — Ambulatory Visit (INDEPENDENT_AMBULATORY_CARE_PROVIDER_SITE_OTHER): Payer: Commercial Managed Care - PPO | Admitting: Psychology

## 2023-11-11 DIAGNOSIS — F411 Generalized anxiety disorder: Secondary | ICD-10-CM | POA: Diagnosis not present

## 2023-11-11 DIAGNOSIS — F330 Major depressive disorder, recurrent, mild: Secondary | ICD-10-CM

## 2023-11-11 DIAGNOSIS — Z630 Problems in relationship with spouse or partner: Secondary | ICD-10-CM

## 2023-11-11 NOTE — Progress Notes (Signed)
[facility] Counselor/Therapist Progress Note  Patient ID: Faith Jordan, MRN: [id number],    Date:  11/11/2023  Time Spent: 56 minutes  Time in: 3:01 Time out: 3:57  Treatment Type:- The patient was seen via video visit.  She gave verbal consent for the session to be on video on caregility.  The patient was in her home alone and therapist was in the office.  Reported Symptoms: anxiety, panic attacks  Mental Status Exam: Appearance:  Casual     Behavior: Appropriate  Motor: Normal  Speech/Language:  Normal Rate  Affect: blunted  Mood: pleasant  Thought process: normal  Thought content:   WNL  Sensory/Perceptual disturbances:   WNL  Orientation: oriented to person, place, time/date, and situation  Attention: Good  Concentration: Good  Memory: WNL  Fund of knowledge:  Good  Insight:   Fair  Judgment:  Good  Impulse Control: Good   Risk Assessment: Danger to Self:  No Self-injurious Behavior: No Danger to Others: No Duty to Warn:no Physical Aggression / Violence:No  Access to Firearms a concern: No  Gang Involvement:No   Subjective: The patient was seen for an individual therapy session  via video visit today.  The patient gave verbal permission for the session to be on video on caregility and she is aware of the limitations of telehealth.  The patient was in her home  and therapist was in the office.  The patient presents with a blunted affect and mood is pleasant.  The patient reports that she has been working a lot and is tired.  She states that she has not really been dating or doing anything much for herself.  We talked about the importance of her doing some of those things that she enjoys and she says that she is going to her cousin's birthday party tomorrow.  She says that her anxiety does not seem to be as much but it does seem that she has some sadness and is thinking about her pending divorce.  She reports she is reading a book and that is making her  have some nightmares about the marriage.  The patient really does not want to talk about it and does not want to have much interaction about it at all.  I encouraged her to think about what it is that she is looking for and also talked about the importance of her going ahead and dealing with her feelings and emotions around it.  The patient requested to be seen in about 4 weeks so we made an appointment for 4 weeks.  I asked her to try to do some things that are good for her and to think about what it is that she wants to do as far as feeling better about her life and staying in the present moment.    Interventions: Cognitive Behavioral Therapy and Assertiveness/Communication,, problem solving, psychoeducation,  EMDR as indicated, Meditation and mindfulness,   Diagnosis:Major depressive disorder, recurrent episode, mild (HCC)  GAD (generalized anxiety disorder)  Marital conflict  Plan: Client Abilities/Strengths  Intelligent, insightful, motivated  Client Treatment Preferences  Outpatient Individual therapy every other week  Client Statement of Needs  " I feel better now, but still need some help with anxiety"  Treatment Level  Outpatient Individual therapy  Symptoms  Frustration and anxiety related to providing oversight and caretaking to an aging, ailing, and dependent  parent.: No Description Entered (Status: improved). Hypervigilance (e.g., feeling constantly on edge,  experiencing concentration difficulties, having trouble falling  or staying asleep, exhibiting a general  state of irritability).: No Description Entered (Status: improved). Motor tension (e.g., restlessness,  tiredness, shakiness, muscle tension).: No Description Entered (Status: improved).  Problems Addressed  Anxiety, Phase Of Life Problems, Anxiety  Goals 1. Learn and implement coping skills that result in a reduction of anxiety  and worry, and improved daily functioning. Objective Learn and implement calming  skills to reduce overall anxiety and manage anxiety symptoms. Target Date: 2025-08-09Frequency: Weekly Progress: 40 Modality: individual  Related Interventions 1. Teach the client calming/relaxation skills (e.g., applied relaxation, progressive muscle  relaxation, cue controlled relaxation; mindful breathing; biofeedback) and how to discriminate  better between relaxation and tension; teach the client how to apply these skills to his/her daily  life (e.g., New Directions in Progressive Muscle Relaxation by Marcelyn Ditty, and  Hazlett-Stevens; Treating Generalized Anxiety Disorder by Rygh and Ida Rogue). Objective Identify, challenge, and replace biased, fearful self-talk with positive, realistic, and empowering selftalk. Target Date: 2024-05-26 Frequency: weekly Progress: 30 Modality: individual Related Interventions 1. Explore the client's schema and self-talk that mediate his/her fear response; assist him/her in  challenging the biases; replace the distorted messages with reality-based alternatives and  positive, realistic self-talk that will increase his/her self-confidence in coping with irrational  fears (see Cognitive Therapy of Anxiety Disorders by Laurence Slate). Objective Learn and implement problem-solving strategies for realistically addressing worries. Target Date: 2025-08-09Frequency: weekly Progress: 40 Modality: individual 2. Resolve conflicted feelings and adapt to the new life circumstances. Objective Apply problem-solving skills to current circumstances. Target Date: 2024-05-26 Frequency: weekly Progress: 20 Modality: individual Related Interventions 1. Teach the client problem-resolution skills (e.g., defining the problem clearly, brainstorming  multiple solutions, listing the pros and cons of each solution, seeking input from others,  selecting and implementing a plan of action, evaluating outcome, and readjusting plan as  necessary).   3. Stabilize  anxiety level while increasing ability to function on a daily  basis. Diagnosis F33.1  Major depressive disorder, moderate 300.02 (Generalized anxiety disorder) - Open - [Signifier: n/a]  Axis  none 309.28 (Adjustment disorder with mixed anxiety and depressed  mood) - Open - [Signifier: n/a]  Adjustment Disorder,  With Anxiety   Marital conflict  Major Depressive disorder, moderate  Conditions For Discharge Achievement of treatment goals and objectives.  The patient approved this plan.   Havard Radigan G Lanique Gonzalo, LCSW

## 2023-11-17 NOTE — Patient Instructions (Signed)
Below is our plan:  We will continue Emgality monthly. Increase topiramate to 100mg  daily during menstrual cycle and continue 50mg  daily otherwise. We will switch Ubrelvy to Nurtec. Take 1 tablet as needed.   Please make sure you are staying well hydrated. I recommend 50-60 ounces daily. Well balanced diet and regular exercise encouraged. Consistent sleep schedule with 6-8 hours recommended.   Please continue follow up with care team as directed.   Follow up with me in 1 year   You may receive a survey regarding today's visit. I encourage you to leave honest feed back as I do use this information to improve patient care. Thank you for seeing me today!   GENERAL HEADACHE INFORMATION:   Natural supplements: Magnesium Oxide or Magnesium Glycinate 500 mg at bed (up to 800 mg daily) Coenzyme Q10 300 mg in AM Vitamin B2- 200 mg twice a day   Add 1 supplement at a time since even natural supplements can have undesirable side effects. You can sometimes buy supplements cheaper (especially Coenzyme Q10) at www.WebmailGuide.co.za or at [facility].  Migraine with aura: There is increased risk for stroke in women with migraine with aura and a contraindication for the combined contraceptive pill for use by women who have migraine with aura. The risk for women with migraine without aura is lower. However other risk factors like smoking are far more likely to increase stroke risk than migraine. There is a recommendation for no smoking and for the use of OCPs without estrogen such as progestogen only pills particularly for women with migraine with aura.Marland Kitchen People who have migraine headaches with auras may be 3 times more likely to have a stroke caused by a blood clot, compared to migraine patients who don't see auras. Women who take hormone-replacement therapy may be 30 percent more likely to suffer a clot-based stroke than women not taking medication containing estrogen. Other risk factors like smoking and high blood  pressure may be  much more important.    Vitamins and herbs that show potential:   Magnesium: Magnesium (250 mg twice a day or 500 mg at bed) has a relaxant effect on smooth muscles such as blood vessels. Individuals suffering from frequent or daily headache usually have low magnesium levels which can be increase with daily supplementation of 400-750 mg. Three trials found 40-90% average headache reduction  when used as a preventative. Magnesium may help with headaches are aura, the best evidence for magnesium is for migraine with aura is its thought to stop the cortical spreading depression we believe is the pathophysiology of migraine aura.Magnesium also demonstrated the benefit in menstrually related migraine.  Magnesium is part of the messenger system in the serotonin cascade and it is a good muscle relaxant.  It is also useful for constipation which can be a side effect of other medications used to treat migraine. Good sources include nuts, whole grains, and tomatoes. Side Effects: loose stool/diarrhea  Riboflavin (vitamin B 2) 200 mg twice a day. This vitamin assists nerve cells in the production of ATP a principal energy storing molecule.  It is necessary for many chemical reactions in the body.  There have been at least 3 clinical trials of riboflavin using 400 mg per day all of which suggested that migraine frequency can be decreased.  All 3 trials showed significant improvement in over half of migraine sufferers.  The supplement is found in bread, cereal, milk, meat, and poultry.  Most Americans get more riboflavin than the recommended daily allowance, however  riboflavin deficiency is not necessary for the supplements to help prevent headache. Side effects: energizing, green urine   Coenzyme Q10: This is present in almost all cells in the body and is critical component for the conversion of energy.  Recent studies have shown that a nutritional supplement of CoQ10 can reduce the frequency of  migraine attacks by improving the energy production of cells as with riboflavin.  Doses of 150 mg twice a day have been shown to be effective.   Melatonin: Increasing evidence shows correlation between melatonin secretion and headache conditions.  Melatonin supplementation has decreased headache intensity and duration.  It is widely used as a sleep aid.  Sleep is natures way of dealing with migraine.  A dose of 3 mg is recommended to start for headaches including cluster headache. Higher doses up to 15 mg has been reviewed for use in Cluster headache and have been used. The rationale behind using melatonin for cluster is that many theories regarding the cause of Cluster headache center around the disruption of the normal circadian rhythm in the brain.  This helps restore the normal circadian rhythm.   HEADACHE DIET: Foods and beverages which may trigger migraine Note that only 20% of headache patients are food sensitive. You will know if you are food sensitive if you get a headache consistently 20 minutes to 2 hours after eating a certain food. Only cut out a food if it causes headaches, otherwise you might remove foods you enjoy! What matters most for diet is to eat a well balanced healthy diet full of vegetables and low fat protein, and to not miss meals.   Chocolate, other sweets ALL cheeses except cottage and cream cheese Dairy products, yogurt, sour cream, ice cream Liver Meat extracts (Bovril, Marmite, meat tenderizers) Meats or fish which have undergone aging, fermenting, pickling or smoking. These include: Hotdogs,salami,Lox,sausage, mortadellas,smoked salmon, pepperoni, Pickled herring Pods of broad bean (English beans, Chinese pea pods, [place] (fava) beans, lima and navy beans Ripe avocado, ripe banana Yeast extracts or active yeast preparations such as Brewer's or Fleishman's (commercial bakes goods are permitted) Tomato based foods, pizza (lasagna, etc.)   MSG (monosodium glutamate)  is disguised as many things; look for these common aliases: Monopotassium glutamate Autolysed yeast Hydrolysed protein Sodium caseinate "flavorings" "all natural preservatives" Nutrasweet   Avoid all other foods that convincingly provoke headaches.   Resources: The Dizzy [place]air Laundry Your Headache Diet, migrainestrong.com  https://zamora-andrews.com/   Caffeine and Migraine For patients that have migraine, caffeine intake more than 3 days per week can lead to dependency and increased migraine frequency. I would recommend cutting back on your caffeine intake as best you can. The recommended amount of caffeine is 200-300 mg daily, although migraine patients may experience dependency at even lower doses. While you may notice an increase in headache temporarily, cutting back will be helpful for headaches in the long run. For more information on caffeine and migraine, visit: https://americanmigrainefoundation.org/resource-library/caffeine-and-migraine/   Headache Prevention Strategies:   1. Maintain a headache diary; learn to identify and avoid triggers.  - This can be a simple note where you log when you had a headache, associated symptoms, and medications used - There are several smartphone apps developed to help track migraines: Migraine Buddy, Migraine Monitor, Curelator N1-Headache App   Common triggers include: Emotional triggers: Emotional/Upset family or friends Emotional/Upset occupation Business reversal/success Anticipation anxiety Crisis-serious Post-crisis periodNew job/position   Physical triggers: Vacation Day Weekend Strenuous Exercise High Altitude Location New Move Menstrual Day Physical  Illness Oversleep/Not enough sleep Weather changes Light: Photophobia or light sesnitivity treatment involves a balance between desensitization and reduction in overly strong input. Use dark polarized glasses outside, but not inside.  Avoid bright or fluorescent light, but do not dim environment to the point that going into a normally lit room hurts. Consider FL-41 tint lenses, which reduce the most irritating wavelengths without blocking too much light.  These can be obtained at axonoptics.com or theraspecs.com Foods: see list above.   2. Limit use of acute treatments (over-the-counter medications, triptans, etc.) to no more than 2 days per week or 10 days per month to prevent medication overuse headache (rebound headache).     3. Follow a regular schedule (including weekends and holidays): Don't skip meals. Eat a balanced diet. 8 hours of sleep nightly. Minimize stress. Exercise 30 minutes per day. Being overweight is associated with a 5 times increased risk of chronic migraine. Keep well hydrated and drink 6-8 glasses of water per day.   4. Initiate non-pharmacologic measures at the earliest onset of your headache. Rest and quiet environment. Relax and reduce stress. Breathe2Relax is a free app that can instruct you on    some simple relaxtion and breathing techniques. Http://Dawnbuse.com is a    free website that provides teaching videos on relaxation.  Also, there are  many apps that   can be downloaded for "mindful" relaxation.  An app called YOGA NIDRA will help walk you through mindfulness. Another app called Calm can be downloaded to give you a structured mindfulness guide with daily reminders and skill development. Headspace for guided meditation Mindfulness Based Stress Reduction Online Course: www.palousemindfulness.com Cold compresses.   5. Don't wait!! Take the maximum allowable dosage of prescribed medication at the first sign of migraine.   6. Compliance:  Take prescribed medication regularly as directed and at the first sign of a migraine.   7. Communicate:  Call your physician when problems arise, especially if your headaches change, increase in frequency/severity, or become associated with neurological  symptoms (weakness, numbness, slurred speech, etc.). Proceed to emergency room if you experience new or worsening symptoms or symptoms do not resolve, if you have new neurologic symptoms or if headache is severe, or for any concerning symptom.   8. Headache/pain management therapies: Consider various complementary methods, including medication, behavioral therapy, psychological counselling, biofeedback, massage therapy, acupuncture, dry needling, and other modalities.  Such measures may reduce the need for medications. Counseling for pain management, where patients learn to function and ignore/minimize their pain, seems to work very well.   9. Recommend changing family's attention and focus away from patient's headaches. Instead, emphasize daily activities. If first question of day is 'How are your headaches/Do you have a headache today?', then patient will constantly think about headaches, thus making them worse. Goal is to re-direct attention away from headaches, toward daily activities and other distractions.   10. Helpful Websites: www.AmericanHeadacheSociety.org PatentHood.ch www.headaches.org TightMarket.nl www.achenet.org

## 2023-11-17 NOTE — Progress Notes (Signed)
PATIENT: Faith Jordan DOB: [birth year]  REASON FOR VISIT: follow up HISTORY FROM: patient  Chief Complaint  Patient presents with   Room 1    Pt is here Alone. Pt states that she has been okay since her last appointment. Pt states that she may get 1 migraine per week.      HISTORY OF PRESENT ILLNESS:  11/21/23 ALL: Faith Jordan returns for follow up for migraines. She was last seen 10/2022 and we continued Emgality, topiramate and Ubrelvy.   She reports headaches are fairly well managed. She feels they are a little worse over the past 2-3 months and feels it is related to stress and weather changes. She is working in the ER 3a-3p. Sleep schedules have been off. She has worsening headaches during menstrual cycle. She has tried multiple OCPs. Bernita Raisin has not been as effective, recently. Mood is stable on pPozac. She continues to follow with psychiatry and psychology.   Meds she has tried: Emgality, topiramate, propranolol (recently stopped for tachycardia), imipramide.  Ubrelvy (minimally effective). She has taken Maxalt and Imitrex for abortive therapy but reports tachycardia with a triptan therapy.  She is allergic to NSAIDs.  11/17/2022 ALL:  Faith Jordan returns for follow up for migraines and excessive daytime sleepiness. PSG 07/2022 did not indicate any concerns for sleep breathing disorder or desaturations. Counseling and pain management recommended by Dr Vickey Huger. We switched her back to [facility] in 05/2022 as Ajovy was ineffective and continued topiramate 50mg  daily and Ubrelvy PRN. Migraines are stable. She may have 3-4 per month. Bernita Raisin works . She feels weather changes and menstrual cycle are common triggers.   06/01/2022 ALL: Faith Jordan returns for follow up for migraines. She was last seen 05/2021 and doing well. We advised to continue [place] and discussed weaning topiramate as headaches were rare. She was switched to Ajovy in 02/2022 due to insurance coverage and has continued  topiramate. Since, migraines have worsened. She is having 4-5 migraines every month. Bernita Raisin continues to offer relief. She has had more dizziness and nausea with headaches.   She was seen in consult with Dr Vickey Huger 05/2022 for excessive sleepiness and witnessed apneic events. She has a history of OSA as a child resolved following tonsil and adenoids removed. Sleep study was ordered.   06/04/2021 ALL: Faith Jordan returns for follow up for migraines. She continues Emgality, topiramate 50mg  daily and Ubrelvy as needed. She is doing very well. She rarely has headaches. She can not remember the last time she took [place]. She recently switched birth control to [place]. She is feeling well today and without concerns.   06/03/2020 ALL:  Faith Jordan is a 28 y.o. female here today for follow up for migraines. She continues topiramate, Emgality and propranolol for prevention. Bernita Raisin helps with migraine abortion. She reports that she is doing very well from a migraine perspective. She can not remember the last time she needed to take [place]. She may have 1-2 mild headaches just prior to due date for Emgality. 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 She is staying very busy.   HISTORY: (copied from my note on 05/31/2019)  Faith Jordan is a 28 y.o. female here today for follow up of migraines. She was restarted on topiramate 50 mg at night about 4 months ago. She continued Emgality injections and propranalol (originally prescribed for tachycardia).    She is taking Amethia for birth control. She is taking OCP continuously for three months  with quarterly menstrual cycles. She reports that migraines are very well managed until the week of menstrual cycle. She states that migraine is usually retro orbital (usually right but can be on the left) with pounding and light/sound sensitivity. She has tried [place], Aleve, and Tylenol intermittently for acute management but does not feel anything helps. She  can not tolerate triptans due to tachycardia. She has a reported allergy of dermatitis with ibuprofen. She does not have any reaction with Aleve.    She is currently on her menstrual cycle. She has a terrible migraine that is mostly located behind her right eye. She does have mild pain of left forehead as well. She is requesting a nerve block today as this has worked well in the past.    HISTORY: (copied from my note on 01/16/2019)   Faith Jordan is a 28 y.o. female seen today for follow up.  She reports that her migraines have worsened specifically over the last month.  She feels that stress is definitely a trigger.  She is in school and finishing up with exams at this time.  She knows that weather also contributes.  She notices significant worsening around the time of her menstrual cycle.  Pain is typically right-sided retro-orbital pressure.  Occasionally there is throbbing, light sensitivity and nausea.  She continues Emgality every month.  She is using Bernita Raisin for abortive therapy that does help temporarily.  She is also on propranolol for tachycardia.  She reports a history of syncope as well that is followed closely by her cardiologist.   She has tried topiramate in the past but is unsure why this medication was stopped.  She is also tried imipemide but reports that it did not help.  She has taken Maxalt and Imitrex for abortive therapy but reports tachycardia with a triptan therapy.  She is allergic to NSAIDs.   REVIEW OF SYSTEMS: Out of a complete 14 system review of symptoms, the patient complains only of the following symptoms, headaches, fatigue, dizziness  and all other reviewed systems are negative.  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 91 tablet 3   albuterol (VENTOLIN HFA) 108 (90 Base) MCG/ACT inhaler Inhale 1-2 puffs into the lungs every 6 (six) hours as needed. (Patient not taking: Reported on 11/21/2023) 6.7 g 0   FLUoxetine (PROZAC) 40 MG capsule Take 1 capsule (40 mg total) by mouth daily. 90 capsule 3   hydrOXYzine (ATARAX) 25 MG tablet Take 1-2 tablets (25-50 mg total) by mouth at bedtime as needed. 60 tablet 5   LORazepam (ATIVAN) 0.5 MG tablet Take 1 tablet (0.5 mg total) by mouth 2 (two) times daily as needed for anxiety and panic attacks. 60 tablet 2   propranolol (INDERAL) 20 MG tablet TAKE 1 TABLET BY MOUTH EVERY DAY 90 tablet 3   traZODone (DESYREL) 50 MG tablet Take 1-3 tablets (50-150 mg total) by mouth at bedtime as needed for sleep. 270 tablet 3   drospirenone-ethinyl estradiol (YAZ) 3-0.02 MG tablet Take 1 tablet by mouth daily. (Patient not taking: Reported on 11/21/2023) 84 tablet 1   Galcanezumab-gnlm (  EMGALITY) 120 MG/ML SOAJ Inject 120 mg into the skin every 30 (thirty) days. 3 mL 3   HYDROcodone-acetaminophen (NORCO/VICODIN) 5-325 MG tablet Take 1 tablet by mouth every 6 (six) hours as needed for moderate pain. (Patient not taking: Reported on 11/21/2023) 15 tablet 0   norethindrone (MICRONOR) 0.35 MG tablet Take 1 tablet (0.35 mg total) by mouth daily. (Patient not taking: Reported on 11/21/2023) 84 tablet 3   ondansetron (ZOFRAN-ODT) 4 MG disintegrating tablet Take 1 tablet (4 mg total) by mouth every 8 (eight) hours as needed. (Patient not taking: Reported on 11/21/2023) 20 tablet 0   oxybutynin (DITROPAN) 5 MG tablet Take 1 tablet (5 mg total) by mouth every 8 (eight) hours as needed for bladder spasms. (Patient not taking: Reported on 11/21/2023) 30 tablet 0   topiramate (TOPAMAX) 50 MG tablet Take 1 tablet (50 mg total) by mouth  daily. 90 tablet 3   Ubrogepant (UBRELVY) 100 MG TABS Take 100 mg by mouth at onset of migraine. May repeat in 2 hours if needed. Do not exceed 200 mg (2 tablets) in 24 hours. 10 tablet 3   No facility-administered medications prior to visit.    PAST MEDICAL HISTORY: 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   Angio-edema 04/27/2016   Angioedema    Aquagenic angio-edema-urticaria    Asthma    no problems /not used recently   Dysautonomia (HCC)    Dysrhythmia    sinus tachycardia   Fibroid    right breast, adenoma   Headache(784.0)    Hives 12/22/2015   Pneumonia    hx  6th grade   Vasculitis (HCC)     PAST SURGICAL HISTORY: Past Surgical History:  Procedure Laterality Date   ADENOIDECTOMY     BREAST LUMPECTOMY Right    BREAST LUMPECTOMY Right    MASS EXCISION Right 10/07/2014   Procedure: EXCISION OF RIGHT BREAST MASS;  Surgeon: Avel Peace, MD;  Location[facility] OR;  Service: General;  Laterality: Right;   TONSILLECTOMY AND ADENOIDECTOMY     TOOTH EXTRACTION      FAMILY HISTORY: Family History  Problem Relation Age of Onset   Depression Mother    Migraines Mother    Narcolepsy Mother    Hypothyroidism Mother    Myasthenia gravis Maternal Grandfather    Breast cancer Maternal Grandmother 39   Asthma Other    Cancer Other    Epilepsy Other    Allergic rhinitis Neg Hx    Angioedema Neg Hx    Atopy Neg Hx    Eczema Neg Hx    Immunodeficiency Neg Hx    Urticaria Neg Hx     SOCIAL HISTORY: Social History   Socioeconomic History   Marital status: Significant Other    Spouse name: Not on file   Number of children: Not on file   Years of education: Not on file   Highest education level: Bachelor's degree (e.g., BA, AB, BS)  Occupational History   Not on file  Tobacco Use   Smoking status: Never   Smokeless tobacco: Never  Vaping Use   Vaping status: Never Used  Substance and Sexual Activity   Alcohol use: No    Drug use: No   Sexual activity: Yes  Other Topics Concern   Not on file  Social History Narrative   Lives with fiance   R handed   Caffeine: 1 drink a day   Social Drivers of Health  Financial Resource Strain: Not on file  Food Insecurity: Not on file  Transportation Needs: Not on file  Physical Activity: Not on file  Stress: Not on file  Social Connections: Not on file  Intimate Partner Violence: Not on file      PHYSICAL EXAM  Vitals:   11/21/23 0956  BP: 113/73  Pulse: 82  Weight: 168 lb (76.2 kg)  Height: 5\' 7"  (1.702 m)       Body mass index is 26.31 kg/m.  Generalized: Well developed, in no acute distress  Cardiology: normal rate and rhythm, no murmur noted Respiratory: clear to auscultation bilaterally  Neurological examination  Mentation: Alert oriented to time, place, history taking. Follows all commands speech and language fluent Cranial nerve II-XII: Pupils were equal round reactive to light. Extraocular movements were full, visual field were full  Motor: The motor testing reveals 5 over 5 strength of all 4 extremities. Good symmetric motor tone is noted throughout.  Gait and station: Gait is normal.   DIAGNOSTIC DATA (LABS, IMAGING, TESTING) - I reviewed patient records, labs, notes, testing and imaging myself where available.      No data to display           Lab Results  Component Value Date   WBC 9.9 03/28/2023   HGB 15.0 03/28/2023   HCT 44.4 03/28/2023   MCV 86.2 03/28/2023   PLT 345 03/28/2023      Component Value Date/Time   NA 136 03/28/2023 1250   NA 142 09/28/2017 1459   K 3.7 03/28/2023 1250   CL 106 03/28/2023 1250   CO2 21 (L) 03/28/2023 1250   GLUCOSE 92 03/28/2023 1250   BUN 15 03/28/2023 1250   BUN 3 (L) 09/28/2017 1459   CREATININE 0.73 03/28/2023 1250   CALCIUM 9.5 03/28/2023 1250   PROT 7.7 03/28/2023 1250   PROT 6.5 09/28/2017 1459   ALBUMIN 4.2 03/28/2023 1250   ALBUMIN 4.0 09/28/2017 1459   AST 16  03/28/2023 1250   ALT 15 03/28/2023 1250   ALKPHOS 53 03/28/2023 1250   BILITOT 0.6 03/28/2023 1250   BILITOT 0.2 09/28/2017 1459   GFRNONAA >60 03/28/2023 1250   GFRAA >60 12/01/2017 1018   Lab Results  Component Value Date   CHOL 200 04/27/2018   HDL 57.80 04/27/2018   LDLCALC 111 (H) 04/27/2018   TRIG 153.0 (H) 04/27/2018   CHOLHDL 3 04/27/2018   Lab Results  Component Value Date   HGBA1C 5.5 10/20/2022   Lab Results  Component Value Date   VITAMINB12 249 10/19/2022   Lab Results  Component Value Date   TSH 0.93 10/19/2022    ASSESSMENT AND PLAN 28 y.o. year old female  has a past medical history of Acne, Acquired breast deformity (12/22/2015), Angio-edema (04/27/2016), Angioedema, Aquagenic angio-edema-urticaria, Asthma, Dysautonomia (HCC), Dysrhythmia, Fibroid, Headache(784.0), Hives (12/22/2015), Pneumonia, and Vasculitis (HCC). here with     ICD-10-CM   1. Chronic migraine without aura without status migrainosus, not intractable  G43.709       Brianna reports headaches have worsened slightly and mostly around the time of her menstrual cycle. We will continue Emgality monthly and topiramate 50mg  daily for prevention. She may increase topiramate to 100mg  daily during menstrual cycle. We will switch Ubrelvy to Nurtec for abortive therapy. Unable to tolerate triptans d/t tachycardia.  She will continue propranolol 20mg  daily for tachycardia, managed by cardiology. Advised against pregnancy. She will continue healthy lifestyle habits. She will follow up with me in 1  year, sooner if needed.    No orders of the defined types were placed in this encounter.     Meds ordered this encounter  Medications   topiramate (TOPAMAX) 50 MG tablet    Sig: Take 1 tablet (50 mg total) by mouth daily. May also take 1 tablet (50 mg total) daily as needed (daily during menstrual cycle).    Dispense:  180 tablet    Refill:  3    Supervising Provider:   Anson Fret [[id number]]    Galcanezumab-gnlm (EMGALITY) 120 MG/ML SOAJ    Sig: Inject 120 mg into the skin every 30 (thirty) days.    Dispense:  3 mL    Refill:  3    Supervising Provider:   Anson Fret [[id number]]   Rimegepant Sulfate (NURTEC) 75 MG TBDP    Sig: Take 1 tablet (75 mg total) by mouth daily as needed (take for abortive therapy of migraine, no more than 1 tablet in 24 hours or 10 per month).    Dispense:  8 tablet    Refill:  11    Supervising Provider:   Anson Fret [id number]      I spent 30 minutes of face-to-face and non-face-to-face time with patient.  This included previsit chart review, lab review, study review, order entry, electronic health record documentation, patient education.   Shawnie Dapper, FNP-C 11/21/2023, 11:07 AM [facility] [address] [place], [place] [address] [phone]

## 2023-11-21 ENCOUNTER — Other Ambulatory Visit: Payer: Self-pay

## 2023-11-21 ENCOUNTER — Ambulatory Visit: Payer: Commercial Managed Care - PPO | Admitting: Family Medicine

## 2023-11-21 ENCOUNTER — Encounter: Payer: Self-pay | Admitting: Family Medicine

## 2023-11-21 VITALS — BP 113/73 | HR 82 | Ht 67.0 in | Wt 168.0 lb

## 2023-11-21 DIAGNOSIS — G43709 Chronic migraine without aura, not intractable, without status migrainosus: Secondary | ICD-10-CM

## 2023-11-21 MED ORDER — TOPIRAMATE 50 MG PO TABS
ORAL_TABLET | ORAL | 3 refills | Status: DC
  Filled 2023-11-21: qty 180, fill #0
  Filled 2023-11-28 – 2023-11-30 (×2): qty 180, 90d supply, fill #0

## 2023-11-21 MED ORDER — NURTEC 75 MG PO TBDP
75.0000 mg | ORAL_TABLET | Freq: Every day | ORAL | 11 refills | Status: DC | PRN
  Filled 2023-11-21: qty 8, 30d supply, fill #0
  Filled 2023-11-30: qty 8, 16d supply, fill #0

## 2023-11-21 MED ORDER — EMGALITY 120 MG/ML ~~LOC~~ SOAJ
120.0000 mg | SUBCUTANEOUS | 3 refills | Status: DC
  Filled 2023-11-21: qty 3, 90d supply, fill #0

## 2023-11-24 ENCOUNTER — Telehealth: Payer: Self-pay

## 2023-11-24 NOTE — Telephone Encounter (Signed)
*  [facility]  Pharmacy Patient Advocate Encounter   Received notification from Fax that prior authorization for Nurtec 75MG  dispersible tablets  is required/requested.   Insurance verification completed.   The patient is insured through [facility] .   Per test claim: PA required; PA submitted to above mentioned insurance via CoverMyMeds Key/confirmation #/EOC [id number] Status is pending

## 2023-11-28 ENCOUNTER — Other Ambulatory Visit: Payer: Self-pay

## 2023-11-29 ENCOUNTER — Ambulatory Visit (INDEPENDENT_AMBULATORY_CARE_PROVIDER_SITE_OTHER): Payer: Commercial Managed Care - PPO | Admitting: Primary Care

## 2023-11-29 ENCOUNTER — Encounter: Payer: Self-pay | Admitting: Primary Care

## 2023-11-29 ENCOUNTER — Ambulatory Visit: Payer: Commercial Managed Care - PPO | Admitting: Psychology

## 2023-11-29 ENCOUNTER — Encounter: Payer: Self-pay | Admitting: Adult Health

## 2023-11-29 ENCOUNTER — Telehealth: Payer: 59 | Admitting: Adult Health

## 2023-11-29 VITALS — BP 122/64 | HR 90 | Temp 97.2°F | Ht 67.0 in | Wt 170.0 lb

## 2023-11-29 DIAGNOSIS — Z8349 Family history of other endocrine, nutritional and metabolic diseases: Secondary | ICD-10-CM | POA: Diagnosis not present

## 2023-11-29 DIAGNOSIS — F411 Generalized anxiety disorder: Secondary | ICD-10-CM

## 2023-11-29 DIAGNOSIS — G43009 Migraine without aura, not intractable, without status migrainosus: Secondary | ICD-10-CM | POA: Diagnosis not present

## 2023-11-29 DIAGNOSIS — F330 Major depressive disorder, recurrent, mild: Secondary | ICD-10-CM

## 2023-11-29 DIAGNOSIS — R002 Palpitations: Secondary | ICD-10-CM

## 2023-11-29 DIAGNOSIS — F429 Obsessive-compulsive disorder, unspecified: Secondary | ICD-10-CM

## 2023-11-29 DIAGNOSIS — F410 Panic disorder [episodic paroxysmal anxiety] without agoraphobia: Secondary | ICD-10-CM

## 2023-11-29 DIAGNOSIS — Z0000 Encounter for general adult medical examination without abnormal findings: Secondary | ICD-10-CM | POA: Diagnosis not present

## 2023-11-29 DIAGNOSIS — G4700 Insomnia, unspecified: Secondary | ICD-10-CM

## 2023-11-29 DIAGNOSIS — F32A Depression, unspecified: Secondary | ICD-10-CM

## 2023-11-29 DIAGNOSIS — G479 Sleep disorder, unspecified: Secondary | ICD-10-CM

## 2023-11-29 LAB — LIPID PANEL
Cholesterol: 151 mg/dL (ref 0–200)
HDL: 44.1 mg/dL (ref >39.00)
LDL Cholesterol: 91 mg/dL (ref 0–99)
NonHDL: 106.52
Total CHOL/HDL Ratio: 3
Triglycerides: 76 mg/dL (ref 0.0–149.0)
VLDL: 15.2 mg/dL (ref 0.0–40.0)

## 2023-11-29 LAB — CBC
HCT: 41.6 % (ref 36.0–46.0)
Hemoglobin: 13.9 g/dL (ref 12.0–15.0)
MCHC: 33.5 g/dL (ref 30.0–36.0)
MCV: 86.9 fL (ref 78.0–100.0)
Platelets: 286 10*3/uL (ref 150.0–400.0)
RBC: 4.78 Mil/uL (ref 3.87–5.11)
RDW: 13.1 % (ref 11.5–15.5)
WBC: 8 10*3/uL (ref 4.0–10.5)

## 2023-11-29 LAB — COMPREHENSIVE METABOLIC PANEL
ALT: 16 U/L (ref 0–35)
AST: 14 U/L (ref 0–37)
Albumin: 4.1 g/dL (ref 3.5–5.2)
Alkaline Phosphatase: 62 U/L (ref 39–117)
BUN: 8 mg/dL (ref 6–23)
CO2: 26 meq/L (ref 19–32)
Calcium: 8.9 mg/dL (ref 8.4–10.5)
Chloride: 106 meq/L (ref 96–112)
Creatinine, Ser: 0.67 mg/dL (ref 0.40–1.20)
GFR: 119.37 mL/min (ref >60.00)
Glucose, Bld: 103 mg/dL — ABNORMAL HIGH (ref 70–99)
Potassium: 4.2 meq/L (ref 3.5–5.1)
Sodium: 140 meq/L (ref 135–145)
Total Bilirubin: 0.5 mg/dL (ref 0.2–1.2)
Total Protein: 6.8 g/dL (ref 6.0–8.3)

## 2023-11-29 LAB — TSH: TSH: 1.08 u[IU]/mL (ref 0.35–5.50)

## 2023-11-29 NOTE — Assessment & Plan Note (Signed)
Repeat thyroid studies pending.

## 2023-11-29 NOTE — Assessment & Plan Note (Signed)
Immunizations UTD. Pap smear UTD.  Follows with GYN  Discussed the importance of a healthy diet and regular exercise in order for weight loss, and to reduce the risk of further co-morbidity.  Exam stable. Labs pending.  Follow up in 1 year for repeat physical.

## 2023-11-29 NOTE — Patient Instructions (Signed)
Stop by the lab prior to leaving today. I will notify you of your results once received.   It was a pleasure to see you today!

## 2023-11-29 NOTE — Assessment & Plan Note (Signed)
Stable.  Following with neurology, office notes reviewed from February 2025.  Continue Topamax 50 mg daily, extra 50 mg daily if needed during menses. Continue Nurtec 50 mg as needed Continue Emgality 120 mg monthly.

## 2023-11-29 NOTE — Progress Notes (Signed)
[name] [id number] [birth year] 28 y.o.  Virtual Visit via Video Note  I connected with pt @ on 11/29/23 at 11:00 AM EST by a video enabled telemedicine application and verified that I am speaking with the correct person using two identifiers.   I discussed the limitations of evaluation and management by telemedicine and the availability of in person appointments. The patient expressed understanding and agreed to proceed.  I discussed the assessment and treatment plan with the patient. The patient was provided an opportunity to ask questions and all were answered. The patient agreed with the plan and demonstrated an understanding of the instructions.   The patient was advised to call back or seek an in-person evaluation if the symptoms worsen or if the condition fails to improve as anticipated.  I provided 25 minutes of non-face-to-face time during this encounter.  The patient was located at home.  The provider was located at [facility].   Dorothyann Gibbs, NP   Subjective:   Patient ID:  Faith Jordan is a 28 y.o. (DOB 1995-11-28) female.  Chief Complaint: No chief complaint on file.   HPI EVALISSE PRAJAPATI presents for follow-up of GAD, OCD, panic disorder, and insomnia.  Referred by therapist - Bambi Cottle.  Describes mood today as "ok". Pleasant. Reports tearfulness at times. Mood symptoms - reports decreased depression - feeling sad over the past few weeks - saw husband for the first time in 3 to 4 months. Denies anxious and irritability. Denies recent panic attacks. Denies worry, rumination, and over thinking. Denies obsessive thoughts and acts. Stating "I feel like I'm doing ok - just a hard couple ow weeks". Feels like medications are helpful. Varying interest and motivation. Taking medications as prescribed.    Energy levels lower - working a lot. Active, does not have a regular exercise routine. Walking. Enjoys some usual interests and activities. Married, but  separated. Lives alone - 1 dog and 2 cats. Family local. Spending time with family.  Appetite varies. Weight stable. Sleeps better some nights than others. Averages 6 or more hours during work week and longer when off. Focus and concentration improved. Completing tasks. Managing aspects of household. 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 Denies SI or HI.  Denies AH or VH. Denies self harm. Denies substance use.  Previous medication trials:  Cymbalta, Gabapentin, Trazadone, Hydroxyzine, Propranolol, Wellbutrin, Lorazepam and Lexapro, Prozac, Zoloft, Prazosin, Effexor    Review of Systems:  Review of Systems  Musculoskeletal:  Negative for gait problem.  Neurological:  Negative for tremors.  Psychiatric/Behavioral:         Please refer to HPI    Medications: I have reviewed the patient's current medications.  Current Outpatient Medications  Medication Sig Dispense Refill   FLUoxetine (PROZAC) 40 MG capsule Take 1 capsule (40 mg total) by mouth daily. 90 capsule 3   Galcanezumab-gnlm (EMGALITY) 120 MG/ML SOAJ Inject 120 mg into the skin every 30 (thirty) days. 3 mL 3   hydrOXYzine (ATARAX) 25 MG tablet Take 1-2 tablets (25-50 mg total) by mouth at bedtime as needed. 60 tablet 5   Levonorgestrel-Ethinyl Estradiol (DAYSEE) 0.15-0.03 &0.01 MG tablet Take 1 tablet by mouth daily.     LORazepam (ATIVAN) 0.5 MG tablet Take 1 tablet (0.5 mg total) by mouth 2 (two) times daily as needed for anxiety and panic attacks. 60 tablet 2   Rimegepant Sulfate (NURTEC) 75 MG TBDP Take 1 tablet (75 mg total) by  mouth daily as needed (take for abortive therapy of migraine, no more than 1 tablet in 24 hours or 10 per month). 8 tablet 11   topiramate (TOPAMAX) 50 MG tablet Take 1 tablet (50 mg total) by mouth daily. May also take 1 tablet (50 mg total) daily as needed (daily during menstrual cycle). 180 tablet 3   traZODone (DESYREL) 50 MG tablet Take 1-3 tablets (50-150 mg total) by mouth  at bedtime as needed for sleep. 270 tablet 3   No current facility-administered medications for this visit.    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, blistering peeling skin   Nsaids Dermatitis    Reaction similar to Stephens-Johnson, blistering peeling rash   Sulfa Antibiotics Swelling    Facial swelling    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   Acute flank pain 06/02/2022   Angio-edema 04/27/2016   Angioedema    Aquagenic angio-edema-urticaria    Asthma    no problems /not used recently   Dysautonomia (HCC)    Dysrhythmia    sinus tachycardia   Fibroid    right breast, adenoma   Headache(784.0)    History of COVID-19 11/21/2020   Hives 12/22/2015   Pneumonia    hx  6th grade   Sensation of fullness in both ears 02/02/2023   Snoring 02/11/2022   Urinary frequency 02/12/2019   Vaginal yeast infection 06/02/2022   Vasculitis (HCC)     Family History  Problem Relation Age of Onset   Depression Mother    Migraines Mother    Narcolepsy Mother    Hypothyroidism Mother    Myasthenia gravis Maternal Grandfather    Breast cancer Maternal Grandmother 46   Asthma Other    Cancer Other    Epilepsy Other    Allergic rhinitis Neg Hx    Angioedema Neg Hx    Atopy Neg Hx    Eczema Neg Hx    Immunodeficiency Neg Hx    Urticaria Neg Hx     Social History   Socioeconomic History   Marital status: Significant Other    Spouse name: Not on file   Number of children: Not on file   Years of education: Not on file   Highest education level: Bachelor's degree (e.g., BA, AB, BS)  Occupational History   Not on file  Tobacco Use   Smoking status: Never   Smokeless tobacco: Never  Vaping Use   Vaping status: Never Used  Substance and Sexual Activity   Alcohol use: No   Drug use: No   Sexual activity: Yes  Other  Topics Concern   Not on file  Social History Narrative   Lives with fiance   R handed   Caffeine: 1 drink a day   Social Drivers of Corporate investment banker Strain: Not on file  Food Insecurity: Not on file  Transportation Needs: Not on file  Physical Activity: Not on file  Stress: Not on file  Social Connections: Not on file  Intimate Partner Violence: Not on file    Past Medical History, Surgical history, Social history, and Family history were reviewed and updated as appropriate.   Please see review of systems for further details on the patient's review from today.   Objective:   Physical Exam:  There were no vitals taken for this visit.  Physical  Exam Constitutional:      General: She is not in acute distress. Musculoskeletal:        General: No deformity.  Neurological:     Mental Status: She is alert and oriented to person, place, and time.     Coordination: Coordination normal.  Psychiatric:        Attention and Perception: Attention and perception normal. She does not perceive auditory or visual hallucinations.        Mood and Affect: Affect is not labile, blunt, angry or inappropriate.        Speech: Speech normal.        Behavior: Behavior normal.        Thought Content: Thought content normal. Thought content is not paranoid or delusional. Thought content does not include homicidal or suicidal ideation. Thought content does not include homicidal or suicidal plan.        Cognition and Memory: Cognition and memory normal.        Judgment: Judgment normal.     Comments: Insight intact     Lab Review:     Component Value Date/Time   NA 136 03/28/2023 1250   NA 142 09/28/2017 1459   K 3.7 03/28/2023 1250   CL 106 03/28/2023 1250   CO2 21 (L) 03/28/2023 1250   GLUCOSE 92 03/28/2023 1250   BUN 15 03/28/2023 1250   BUN 3 (L) 09/28/2017 1459   CREATININE 0.73 03/28/2023 1250   CALCIUM 9.5 03/28/2023 1250   PROT 7.7 03/28/2023 1250   PROT 6.5  09/28/2017 1459   ALBUMIN 4.2 03/28/2023 1250   ALBUMIN 4.0 09/28/2017 1459   AST 16 03/28/2023 1250   ALT 15 03/28/2023 1250   ALKPHOS 53 03/28/2023 1250   BILITOT 0.6 03/28/2023 1250   BILITOT 0.2 09/28/2017 1459   GFRNONAA >60 03/28/2023 1250   GFRAA >60 12/01/2017 1018       Component Value Date/Time   WBC 9.9 03/28/2023 1250   RBC 5.15 (H) 03/28/2023 1250   HGB 15.0 03/28/2023 1250   HGB 11.8 09/28/2017 1459   HCT 44.4 03/28/2023 1250   HCT 36.7 09/28/2017 1459   PLT 345 03/28/2023 1250   PLT 368 09/28/2017 1459   MCV 86.2 03/28/2023 1250   MCV 88 09/28/2017 1459   MCH 29.1 03/28/2023 1250   MCHC 33.8 03/28/2023 1250   RDW 12.9 03/28/2023 1250   RDW 13.1 09/28/2017 1459   LYMPHSABS 2.4 03/28/2023 1250   LYMPHSABS 2.8 09/28/2017 1459   MONOABS 0.7 03/28/2023 1250   EOSABS 0.1 03/28/2023 1250   EOSABS 0.0 09/28/2017 1459   BASOSABS 0.0 03/28/2023 1250   BASOSABS 0.0 09/28/2017 1459    No results found for: "POCLITH", "LITHIUM"   No results found for: "PHENYTOIN", "PHENOBARB", "VALPROATE", "CBMZ"   .res Assessment: Plan:    Plan:  PDMP reviewed  Ativan 0.5mg  BID prn anxiety - not taking daily Prozac 40mg  daily Trazadone 100mg  at hs  Hydroxyzine 25mg  - 1 to 2 at hs  NAC 600mg  BID  Propranolol for tachycardia since age 78  Genesight testing reviewed.  RTC 3 months   25 minutes spent dedicated to the care of this patient on the date of this encounter to include pre-visit review of records, ordering of medication, post visit documentation, and face-to-face time with the patient discussing GAD, OCD, panic disorder, and insomnia. Discussed continuing current medication regimen.  Patient advised to contact office with any questions, adverse effects, or acute worsening in signs and symptoms.  Discussed potential benefits, risk, and side effects of benzodiazepines to include potential risk of tolerance and dependence, as well as possible drowsiness.   Advised patient not to drive if experiencing drowsiness and to take lowest possible effective dose to minimize risk of dependence and tolerance.  There are no diagnoses linked to this encounter.   Please see After Visit Summary for patient specific instructions.  Future Appointments  Date Time Provider Department Center  11/29/2023  4:00 PM Cottle, Bambi G, LCSW [facility] None  11/26/2024 10:30 AM Lomax, Amy, NP [facility] None    No orders of the defined types were placed in this encounter.     -------------------------------

## 2023-11-29 NOTE — Progress Notes (Signed)
Subjective:    Patient ID: Faith Jordan, female    DOB: 02-08-1996, 28 y.o.   MRN: [id number]  HPI  Faith Jordan is a very pleasant 28 y.o. female who presents today for complete physical and follow up of chronic conditions.  Immunizations: -Tetanus: Completed in 2018 -Influenza: Completed this season   Diet: Fair diet.  Exercise: No regular exercise. Active at work.   Eye exam: Completed 2 years ago  Dental exam: Completes semi-annually    Pap Smear: Completes per GYN, UTD  BP Readings from Last 3 Encounters:  11/29/23 122/64  11/21/23 113/73  03/28/23 (!) 138/92        Review of Systems  Constitutional:  Negative for unexpected weight change.  HENT:  Negative for rhinorrhea.   Respiratory:  Negative for cough and shortness of breath.   Cardiovascular:  Negative for chest pain.  Gastrointestinal:  Negative for constipation and diarrhea.  Genitourinary:  Negative for difficulty urinating and menstrual problem.  Musculoskeletal:  Positive for arthralgias and back pain.  Skin:  Negative for rash.  Allergic/Immunologic: Negative for environmental allergies.  Neurological:  Negative for dizziness, numbness and headaches.  Psychiatric/Behavioral:  The patient is not nervous/anxious.          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   Acute flank pain 06/02/2022   Angio-edema 04/27/2016   Angioedema    Aquagenic angio-edema-urticaria    Asthma    no problems /not used recently   Dysautonomia [facility])    Dysrhythmia    sinus tachycardia   Fibroid    right breast, adenoma   Headache(784.0)    History of COVID-19 11/21/2020   Hives 12/22/2015   Pneumonia    hx  6th grade   Sensation of fullness in both ears 02/02/2023   Snoring 02/11/2022   Urinary frequency 02/12/2019   Vaginal yeast infection 06/02/2022   Vasculitis (HCC)     Social History   Socioeconomic History   Marital status: Significant Other     Spouse name: Not on file   Number of children: Not on file   Years of education: Not on file   Highest education level: Bachelor's degree (e.g., BA, AB, BS)  Occupational History   Not on file  Tobacco Use   Smoking status: Never   Smokeless tobacco: Never  Vaping Use   Vaping status: Never Used  Substance and Sexual Activity   Alcohol use: No   Drug use: No   Sexual activity: Yes  Other Topics Concern   Not on file  Social History Narrative   Lives with fiance   R handed   Caffeine: 1 drink a day   Social Drivers of Corporate investment banker Strain: Not on file  Food Insecurity: Not on file  Transportation Needs: Not on file  Physical Activity: Not on file  Stress: Not on file  Social Connections: Not on file  Intimate Partner Violence: Not on file    Past Surgical History:  Procedure Laterality Date   ADENOIDECTOMY     BREAST LUMPECTOMY Right    BREAST LUMPECTOMY Right    MASS EXCISION Right 10/07/2014   Procedure: EXCISION OF RIGHT BREAST MASS;  Surgeon: Avel Peace, MD;  Location: [facility] OR;  Service: General;  Laterality: Right;   TONSILLECTOMY AND ADENOIDECTOMY     TOOTH EXTRACTION      Family History  Problem Relation Age of Onset  Depression Mother    Migraines Mother    Narcolepsy Mother    Hypothyroidism Mother    Myasthenia gravis Maternal Grandfather    Breast cancer Maternal Grandmother 75   Asthma Other    Cancer Other    Epilepsy Other    Allergic rhinitis Neg Hx    Angioedema Neg Hx    Atopy Neg Hx    Eczema Neg Hx    Immunodeficiency Neg Hx    Urticaria Neg Hx     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, blistering peeling skin   Nsaids Dermatitis    Reaction similar to Stephens-Johnson, blistering peeling rash   Sulfa Antibiotics Swelling    Facial swelling    Current Outpatient Medications on File Prior to Visit  Medication Sig Dispense Refill   FLUoxetine  (PROZAC) 40 MG capsule Take 1 capsule (40 mg total) by mouth daily. 90 capsule 3   Galcanezumab-gnlm (EMGALITY) 120 MG/ML SOAJ Inject 120 mg into the skin every 30 (thirty) days. 3 mL 3   hydrOXYzine (ATARAX) 25 MG tablet Take 1-2 tablets (25-50 mg total) by mouth at bedtime as needed. 60 tablet 5   Levonorgestrel-Ethinyl Estradiol (DAYSEE) 0.15-0.03 &0.01 MG tablet Take 1 tablet by mouth daily.     LORazepam (ATIVAN) 0.5 MG tablet Take 1 tablet (0.5 mg total) by mouth 2 (two) times daily as needed for anxiety and panic attacks. 60 tablet 2   Rimegepant Sulfate (NURTEC) 75 MG TBDP Take 1 tablet (75 mg total) by mouth daily as needed (take for abortive therapy of migraine, no more than 1 tablet in 24 hours or 10 per month). 8 tablet 11   topiramate (TOPAMAX) 50 MG tablet Take 1 tablet (50 mg total) by mouth daily. May also take 1 tablet (50 mg total) daily as needed (daily during menstrual cycle). 180 tablet 3   traZODone (DESYREL) 50 MG tablet Take 1-3 tablets (50-150 mg total) by mouth at bedtime as needed for sleep. 270 tablet 3   albuterol (VENTOLIN HFA) 108 (90 Base) MCG/ACT inhaler Inhale 1-2 puffs into the lungs every 6 (six) hours as needed. (Patient not taking: Reported on 11/29/2023) 6.7 g 0   No current facility-administered medications on file prior to visit.    BP 122/64   Pulse 90   Temp (!) 97.2 F (36.2 C) (Temporal)   Ht 5\' 7"  (1.702 m)   Wt 170 lb (77.1 kg)   LMP 11/15/2023   SpO2 99%   BMI 26.63 kg/m  Objective:   Physical Exam HENT:     Right Ear: Tympanic membrane and ear canal normal.     Left Ear: Tympanic membrane and ear canal normal.  Eyes:     Pupils: Pupils are equal, round, and reactive to light.  Cardiovascular:     Rate and Rhythm: Normal rate and regular rhythm.  Pulmonary:     Effort: Pulmonary effort is normal.     Breath sounds: Normal breath sounds.  Abdominal:     General: Bowel sounds are normal.     Palpations: Abdomen is soft.      Tenderness: There is no abdominal tenderness.  Musculoskeletal:        General: Normal range of motion.     Cervical back: Neck supple.  Skin:    General: Skin is warm and dry.  Neurological:     Mental Status: She is alert and oriented to person, place, and time.  Cranial Nerves: No cranial nerve deficit.     Deep Tendon Reflexes:     Reflex Scores:      Patellar reflexes are 2+ on the right side and 2+ on the left side. Psychiatric:        Mood and Affect: Mood normal.           Assessment & Plan:  Preventative health care Assessment & Plan: Immunizations UTD. Pap smear UTD.  Follows with GYN  Discussed the importance of a healthy diet and regular exercise in order for weight loss, and to reduce the risk of further co-morbidity.  Exam stable. Labs pending.  Follow up in 1 year for repeat physical.   Orders: -     CBC -     Comprehensive metabolic panel -     TSH -     Lipid panel  Migraine without aura and without status migrainosus, not intractable Assessment & Plan: Stable.  Following with neurology, office notes reviewed from February 2025.  Continue Topamax 50 mg daily, extra 50 mg daily if needed during menses. Continue Nurtec 50 mg as needed Continue Emgality 120 mg monthly.   GAD (generalized anxiety disorder) Assessment & Plan: Controlled and stable.  Following with psychiatry, reviewed office notes from August 2024. Continue fluoxetine 40 mg daily, Trazodone 50 mg HS, lorazepam 0.5 mg PRN. Continue therapy.   Family history of hypothyroidism Assessment & Plan: Repeat thyroid studies pending.  Orders: -     TSH  Intermittent palpitations Assessment & Plan: Improved and resolved.  Remain off propranolol.         Doreene Nest, NP

## 2023-11-29 NOTE — Assessment & Plan Note (Signed)
Controlled and stable.  Following with psychiatry, reviewed office notes from August 2024. Continue fluoxetine 40 mg daily, Trazodone 50 mg HS, lorazepam 0.5 mg PRN. Continue therapy.

## 2023-11-29 NOTE — Progress Notes (Unsigned)
[facility] Counselor/Therapist Progress Note  Patient ID: Faith Jordan, MRN: [id number],    Date:  11/29/2023  Time Spent: 59 minutes  Time in: 4:00 Time out: 4:59  Treatment Type:- The patient was seen via video visit.  She gave verbal consent for the session to be on video on caregility.  The patient was in her home alone and therapist was in the office.  Reported Symptoms: anxiety, panic attacks  Mental Status Exam: Appearance:  Casual     Behavior: Appropriate  Motor: Normal  Speech/Language:  Normal Rate  Affect: blunted  Mood: sad  Thought process: normal  Thought content:   WNL  Sensory/Perceptual disturbances:   WNL  Orientation: oriented to person, place, time/date, and situation  Attention: Good  Concentration: Good  Memory: WNL  Fund of knowledge:  Good  Insight:   Fair  Judgment:  Good  Impulse Control: Good   Risk Assessment: Danger to Self:  No Self-injurious Behavior: No Danger to Others: No Duty to Warn:no Physical Aggression / Violence:No  Access to Firearms a concern: No  Gang Involvement:No   Subjective: The patient was seen for an individual therapy session  via video visit today.  The patient gave verbal permission for the session to be on video on caregility and she is aware of the limitations of telehealth.  The patient was in her home  and therapist was in the office.  The patient presents with a blunted affect and mood is sad.  The patient reports that she had to meet up with a ready to get the divorce papers signed and that made her sad.  She reports that she was sad because he was sad.  He did not make any attempts to try to get back together or do anything different and I explained to her that if he were really that sad about it he would do something different.  The patient has some sort of unrealistic thought that she is not going to be able to find anybody and that she may not ever have children because she is getting too old.   The patient is only 28 years old right now and will be 28 next month I tried to normalize things for her and make her aware that she will be okay and then if that is what she wants she can always still do it and that she has plenty of time before she gets too old to do that.  We talked about her unrealistic thinking.  We continued to talk about how she might move forward and take care of herself and she is not doing a whole lot right now other than working.  We brainstormed some ideas what she could do to meet people.  We will continue to work with the patient on grieving the loss of her marriage and thinking more realistically about what to expect. Interventions: Cognitive Behavioral Therapy and Assertiveness/Communication,, problem solving, psychoeducation,  EMDR as indicated, Meditation and mindfulness,   Diagnosis:Major depressive disorder, recurrent episode, mild (HCC)  GAD (generalized anxiety disorder)  Plan: Client Abilities/Strengths  Intelligent, insightful, motivated  Client Treatment Preferences  Outpatient Individual therapy every other week  Client Statement of Needs  " I feel better now, but still need some help with anxiety"  Treatment Level  Outpatient Individual therapy  Symptoms  Frustration and anxiety related to providing oversight and caretaking to an aging, ailing, and dependent  parent.: No Description Entered (Status: improved). Hypervigilance (e.g., feeling constantly on  edge,  experiencing concentration difficulties, having trouble falling or staying asleep, exhibiting a general  state of irritability).: No Description Entered (Status: improved). Motor tension (e.g., restlessness,  tiredness, shakiness, muscle tension).: No Description Entered (Status: improved).  Problems Addressed  Anxiety, Phase Of Life Problems, Anxiety  Goals 1. Learn and implement coping skills that result in a reduction of anxiety  and worry, and improved daily functioning. Objective Learn  and implement calming skills to reduce overall anxiety and manage anxiety symptoms. Target Date: 2025-08-09Frequency: Weekly Progress: 40 Modality: individual  Related Interventions 1. Teach the client calming/relaxation skills (e.g., applied relaxation, progressive muscle  relaxation, cue controlled relaxation; mindful breathing; biofeedback) and how to discriminate  better between relaxation and tension; teach the client how to apply these skills to his/her daily  life (e.g., New Directions in Progressive Muscle Relaxation by Marcelyn Ditty, and  Hazlett-Stevens; Treating Generalized Anxiety Disorder by Rygh and Ida Rogue). Objective Identify, challenge, and replace biased, fearful self-talk with positive, realistic, and empowering selftalk. Target Date: 2024-05-26 Frequency: weekly Progress: 30 Modality: individual Related Interventions 1. Explore the client's schema and self-talk that mediate his/her fear response; assist him/her in  challenging the biases; replace the distorted messages with reality-based alternatives and  positive, realistic self-talk that will increase his/her self-confidence in coping with irrational  fears (see Cognitive Therapy of Anxiety Disorders by Laurence Slate). Objective Learn and implement problem-solving strategies for realistically addressing worries. Target Date: 2025-08-09Frequency: weekly Progress: 40 Modality: individual 2. Resolve conflicted feelings and adapt to the new life circumstances. Objective Apply problem-solving skills to current circumstances. Target Date: 2024-05-26 Frequency: weekly Progress: 20 Modality: individual Related Interventions 1. Teach the client problem-resolution skills (e.g., defining the problem clearly, brainstorming  multiple solutions, listing the pros and cons of each solution, seeking input from others,  selecting and implementing a plan of action, evaluating outcome, and readjusting plan as   necessary).   3. Stabilize anxiety level while increasing ability to function on a daily  basis. Diagnosis F33.1  Major depressive disorder, moderate 300.02 (Generalized anxiety disorder) - Open - [Signifier: n/a]  Axis  none 309.28 (Adjustment disorder with mixed anxiety and depressed  mood) - Open - [Signifier: n/a]  Adjustment Disorder,  With Anxiety   Marital conflict  Major Depressive disorder, moderate  Conditions For Discharge Achievement of treatment goals and objectives.  The patient approved this plan.   Deonna Krummel G Ethridge Sollenberger, LCSW

## 2023-11-29 NOTE — Assessment & Plan Note (Signed)
Improved and resolved.  Remain off propranolol.

## 2023-11-30 ENCOUNTER — Other Ambulatory Visit: Payer: Self-pay

## 2023-12-02 ENCOUNTER — Other Ambulatory Visit (HOSPITAL_COMMUNITY): Payer: Self-pay

## 2023-12-02 ENCOUNTER — Other Ambulatory Visit: Payer: Self-pay

## 2023-12-02 NOTE — Telephone Encounter (Signed)
Pharmacy Patient Advocate Encounter  Received notification from [facility] that Prior Authorization for Nurtec 75MG  dispersible tablets  has been APPROVED from 11/28/23 to 05/26/2024. Unable to obtain price due to refill too soon rejection, last fill date 02/12 next available fill date 02/24

## 2023-12-28 ENCOUNTER — Ambulatory Visit (INDEPENDENT_AMBULATORY_CARE_PROVIDER_SITE_OTHER): Payer: Commercial Managed Care - PPO | Admitting: Psychology

## 2023-12-28 DIAGNOSIS — F411 Generalized anxiety disorder: Secondary | ICD-10-CM

## 2023-12-28 DIAGNOSIS — F4323 Adjustment disorder with mixed anxiety and depressed mood: Secondary | ICD-10-CM

## 2023-12-28 DIAGNOSIS — F330 Major depressive disorder, recurrent, mild: Secondary | ICD-10-CM

## 2023-12-28 DIAGNOSIS — F331 Major depressive disorder, recurrent, moderate: Secondary | ICD-10-CM | POA: Diagnosis not present

## 2023-12-28 NOTE — Progress Notes (Unsigned)
                edge,  experiencing concentration difficulties, having trouble falling or staying asleep, exhibiting a general  state of irritability).: No Description Entered (Status: improved). Motor tension (e.g., restlessness,  tiredness, shakiness, muscle tension).: No Description Entered (Status: improved).  Problems Addressed  Anxiety, Phase Of Life Problems, Anxiety  Goals 1. Learn and implement coping skills that result in a reduction of anxiety  and worry, and improved daily functioning. Objective Learn  and implement calming skills to reduce overall anxiety and manage anxiety symptoms. Target Date: 2025-08-09Frequency: Weekly Progress: 40 Modality: individual  Related Interventions 1. Teach the client calming/relaxation skills (e.g., applied relaxation, progressive muscle  relaxation, cue controlled relaxation; mindful breathing; biofeedback) and how to discriminate  better between relaxation and tension; teach the client how to apply these skills to his/her daily  life (e.g., New Directions in Progressive Muscle Relaxation by Marcelyn Ditty, and  Hazlett-Stevens; Treating Generalized Anxiety Disorder by Rygh and Ida Rogue). Objective Identify, challenge, and replace biased, fearful self-talk with positive, realistic, and empowering selftalk. Target Date: 2024-05-26 Frequency: weekly Progress: 30 Modality: individual Related Interventions 1. Explore the client's schema and self-talk that mediate his/her fear response; assist him/her in  challenging the biases; replace the distorted messages with reality-based alternatives and  positive, realistic self-talk that will increase his/her self-confidence in coping with irrational  fears (see Cognitive Therapy of Anxiety Disorders by Laurence Slate). Objective Learn and implement problem-solving strategies for realistically addressing worries. Target Date: 2025-08-09Frequency: weekly Progress: 40 Modality: individual 2. Resolve conflicted feelings and adapt to the new life circumstances. Objective Apply problem-solving skills to current circumstances. Target Date: 2024-05-26 Frequency: weekly Progress: 20 Modality: individual Related Interventions 1. Teach the client problem-resolution skills (e.g., defining the problem clearly, brainstorming  multiple solutions, listing the pros and cons of each solution, seeking input from others,  selecting and implementing a plan of action, evaluating outcome, and readjusting plan as   necessary).   3. Stabilize anxiety level while increasing ability to function on a daily  basis. Diagnosis F33.1  Major depressive disorder, moderate 300.02 (Generalized anxiety disorder) - Open - [Signifier: n/a]  Axis  none 309.28 (Adjustment disorder with mixed anxiety and depressed  mood) - Open - [Signifier: n/a]  Adjustment Disorder,  With Anxiety   Marital conflict  Major Depressive disorder, moderate  Conditions For Discharge Achievement of treatment goals and objectives.  The patient approved this plan.   Deonna Krummel G Ethridge Sollenberger, LCSW

## 2024-01-07 ENCOUNTER — Encounter (HOSPITAL_COMMUNITY): Payer: Self-pay

## 2024-01-07 ENCOUNTER — Emergency Department (HOSPITAL_COMMUNITY)

## 2024-01-07 ENCOUNTER — Inpatient Hospital Stay (HOSPITAL_COMMUNITY)
Admission: EM | Admit: 2024-01-07 | Discharge: 2024-01-12 | DRG: 065 | Disposition: A | Discharge status: Rehabilitation Facility | Attending: Student | Admitting: Student

## 2024-01-07 DIAGNOSIS — R29810 Facial weakness: Secondary | ICD-10-CM | POA: Diagnosis present

## 2024-01-07 DIAGNOSIS — Z803 Family history of malignant neoplasm of breast: Secondary | ICD-10-CM

## 2024-01-07 DIAGNOSIS — R29708 NIHSS score 8: Secondary | ICD-10-CM | POA: Diagnosis not present

## 2024-01-07 DIAGNOSIS — G4700 Insomnia, unspecified: Secondary | ICD-10-CM | POA: Diagnosis present

## 2024-01-07 DIAGNOSIS — Z820 Family history of epilepsy and other diseases of the nervous system: Secondary | ICD-10-CM

## 2024-01-07 DIAGNOSIS — Z79890 Hormone replacement therapy (postmenopausal): Secondary | ICD-10-CM | POA: Diagnosis not present

## 2024-01-07 DIAGNOSIS — E8721 Acute metabolic acidosis: Secondary | ICD-10-CM | POA: Diagnosis not present

## 2024-01-07 DIAGNOSIS — G479 Sleep disorder, unspecified: Secondary | ICD-10-CM | POA: Diagnosis present

## 2024-01-07 DIAGNOSIS — Z79899 Other long term (current) drug therapy: Secondary | ICD-10-CM

## 2024-01-07 DIAGNOSIS — G43409 Hemiplegic migraine, not intractable, without status migrainosus: Secondary | ICD-10-CM | POA: Diagnosis not present

## 2024-01-07 DIAGNOSIS — G8194 Hemiplegia, unspecified affecting left nondominant side: Secondary | ICD-10-CM | POA: Diagnosis present

## 2024-01-07 DIAGNOSIS — I6389 Other cerebral infarction: Principal | ICD-10-CM | POA: Diagnosis present

## 2024-01-07 DIAGNOSIS — I69322 Dysarthria following cerebral infarction: Secondary | ICD-10-CM | POA: Diagnosis not present

## 2024-01-07 DIAGNOSIS — E785 Hyperlipidemia, unspecified: Secondary | ICD-10-CM | POA: Diagnosis not present

## 2024-01-07 DIAGNOSIS — G909 Disorder of the autonomic nervous system, unspecified: Secondary | ICD-10-CM | POA: Diagnosis present

## 2024-01-07 DIAGNOSIS — F1090 Alcohol use, unspecified, uncomplicated: Secondary | ICD-10-CM | POA: Diagnosis not present

## 2024-01-07 DIAGNOSIS — R531 Weakness: Secondary | ICD-10-CM | POA: Diagnosis not present

## 2024-01-07 DIAGNOSIS — I69354 Hemiplegia and hemiparesis following cerebral infarction affecting left non-dominant side: Secondary | ICD-10-CM | POA: Diagnosis not present

## 2024-01-07 DIAGNOSIS — I6381 Other cerebral infarction due to occlusion or stenosis of small artery: Secondary | ICD-10-CM | POA: Diagnosis not present

## 2024-01-07 DIAGNOSIS — K5901 Slow transit constipation: Secondary | ICD-10-CM | POA: Diagnosis not present

## 2024-01-07 DIAGNOSIS — G43709 Chronic migraine without aura, not intractable, without status migrainosus: Secondary | ICD-10-CM | POA: Diagnosis not present

## 2024-01-07 DIAGNOSIS — Z825 Family history of asthma and other chronic lower respiratory diseases: Secondary | ICD-10-CM | POA: Diagnosis not present

## 2024-01-07 DIAGNOSIS — H538 Other visual disturbances: Secondary | ICD-10-CM | POA: Diagnosis present

## 2024-01-07 DIAGNOSIS — Z818 Family history of other mental and behavioral disorders: Secondary | ICD-10-CM | POA: Diagnosis not present

## 2024-01-07 DIAGNOSIS — F410 Panic disorder [episodic paroxysmal anxiety] without agoraphobia: Secondary | ICD-10-CM | POA: Diagnosis present

## 2024-01-07 DIAGNOSIS — R29706 NIHSS score 6: Secondary | ICD-10-CM | POA: Diagnosis present

## 2024-01-07 DIAGNOSIS — Z8616 Personal history of COVID-19: Secondary | ICD-10-CM

## 2024-01-07 DIAGNOSIS — E876 Hypokalemia: Secondary | ICD-10-CM | POA: Diagnosis present

## 2024-01-07 DIAGNOSIS — M62838 Other muscle spasm: Secondary | ICD-10-CM | POA: Diagnosis present

## 2024-01-07 DIAGNOSIS — Z886 Allergy status to analgesic agent status: Secondary | ICD-10-CM | POA: Diagnosis not present

## 2024-01-07 DIAGNOSIS — R002 Palpitations: Secondary | ICD-10-CM | POA: Diagnosis present

## 2024-01-07 DIAGNOSIS — G901 Familial dysautonomia [Riley-Day]: Secondary | ICD-10-CM | POA: Diagnosis not present

## 2024-01-07 DIAGNOSIS — T384X5S Adverse effect of oral contraceptives, sequela: Secondary | ICD-10-CM | POA: Diagnosis not present

## 2024-01-07 DIAGNOSIS — M2669 Other specified disorders of temporomandibular joint: Secondary | ICD-10-CM | POA: Diagnosis not present

## 2024-01-07 DIAGNOSIS — I69392 Facial weakness following cerebral infarction: Secondary | ICD-10-CM | POA: Diagnosis not present

## 2024-01-07 DIAGNOSIS — J45909 Unspecified asthma, uncomplicated: Secondary | ICD-10-CM | POA: Diagnosis not present

## 2024-01-07 DIAGNOSIS — I639 Cerebral infarction, unspecified: Secondary | ICD-10-CM | POA: Diagnosis not present

## 2024-01-07 DIAGNOSIS — F419 Anxiety disorder, unspecified: Secondary | ICD-10-CM | POA: Diagnosis not present

## 2024-01-07 DIAGNOSIS — F54 Psychological and behavioral factors associated with disorders or diseases classified elsewhere: Secondary | ICD-10-CM | POA: Diagnosis not present

## 2024-01-07 LAB — CBC WITH DIFFERENTIAL/PLATELET
Abs Immature Granulocytes: 0.04 10*3/uL (ref 0.00–0.07)
Basophils Absolute: 0 10*3/uL (ref 0.0–0.1)
Basophils Relative: 0 %
Eosinophils Absolute: 0 10*3/uL (ref 0.0–0.5)
Eosinophils Relative: 0 %
HCT: 43.5 % (ref 36.0–46.0)
Hemoglobin: 14.6 g/dL (ref 12.0–15.0)
Immature Granulocytes: 0 %
Lymphocytes Relative: 14 %
Lymphs Abs: 1.8 10*3/uL (ref 0.7–4.0)
MCH: 28.7 pg (ref 26.0–34.0)
MCHC: 33.6 g/dL (ref 30.0–36.0)
MCV: 85.6 fL (ref 80.0–100.0)
Monocytes Absolute: 0.6 10*3/uL (ref 0.1–1.0)
Monocytes Relative: 5 %
Neutro Abs: 10.2 10*3/uL — ABNORMAL HIGH (ref 1.7–7.7)
Neutrophils Relative %: 81 %
Platelets: 355 10*3/uL (ref 150–400)
RBC: 5.08 MIL/uL (ref 3.87–5.11)
RDW: 13.4 % (ref 11.5–15.5)
WBC: 12.7 10*3/uL — ABNORMAL HIGH (ref 4.0–10.5)
nRBC: 0 % (ref 0.0–0.2)

## 2024-01-07 LAB — COMPREHENSIVE METABOLIC PANEL
ALT: 14 U/L (ref 0–44)
AST: 19 U/L (ref 15–41)
Albumin: 3.7 g/dL (ref 3.5–5.0)
Alkaline Phosphatase: 52 U/L (ref 38–126)
Anion gap: 10 (ref 5–15)
BUN: 10 mg/dL (ref 6–20)
CO2: 19 mmol/L — ABNORMAL LOW (ref 22–32)
Calcium: 9.1 mg/dL (ref 8.9–10.3)
Chloride: 107 mmol/L (ref 98–111)
Creatinine, Ser: 0.72 mg/dL (ref 0.44–1.00)
GFR, Estimated: >60 mL/min (ref >60)
Glucose, Bld: 97 mg/dL (ref 70–99)
Potassium: 3.7 mmol/L (ref 3.5–5.1)
Sodium: 136 mmol/L (ref 135–145)
Total Bilirubin: 0.7 mg/dL (ref 0.0–1.2)
Total Protein: 6.9 g/dL (ref 6.5–8.1)

## 2024-01-07 LAB — RAPID URINE DRUG SCREEN, HOSP PERFORMED
Amphetamines: NOT DETECTED
Barbiturates: NOT DETECTED
Benzodiazepines: NOT DETECTED
Cocaine: NOT DETECTED
Opiates: NOT DETECTED
Tetrahydrocannabinol: NOT DETECTED

## 2024-01-07 LAB — URINALYSIS, ROUTINE W REFLEX MICROSCOPIC
Bilirubin Urine: NEGATIVE
Glucose, UA: NEGATIVE mg/dL
Ketones, ur: 40 mg/dL — AB
Leukocytes,Ua: NEGATIVE
Nitrite: NEGATIVE
Protein, ur: NEGATIVE mg/dL
Specific Gravity, Urine: 1.015 (ref 1.005–1.030)
pH: 6 (ref 5.0–8.0)

## 2024-01-07 LAB — SEDIMENTATION RATE: Sed Rate: 1 mm/h (ref 0–22)

## 2024-01-07 LAB — PROTIME-INR
INR: 1 (ref 0.8–1.2)
Prothrombin Time: 13.5 s (ref 11.4–15.2)

## 2024-01-07 LAB — HCG, SERUM, QUALITATIVE: Preg, Serum: NEGATIVE

## 2024-01-07 LAB — I-STAT CHEM 8, ED
BUN: 10 mg/dL (ref 6–20)
Calcium, Ion: 1.12 mmol/L — ABNORMAL LOW (ref 1.15–1.40)
Chloride: 108 mmol/L (ref 98–111)
Creatinine, Ser: 0.7 mg/dL (ref 0.44–1.00)
Glucose, Bld: 95 mg/dL (ref 70–99)
HCT: 44 % (ref 36.0–46.0)
Hemoglobin: 15 g/dL (ref 12.0–15.0)
Potassium: 3.7 mmol/L (ref 3.5–5.1)
Sodium: 138 mmol/L (ref 135–145)
TCO2: 19 mmol/L — ABNORMAL LOW (ref 22–32)

## 2024-01-07 LAB — URINALYSIS, MICROSCOPIC (REFLEX)

## 2024-01-07 LAB — APTT: aPTT: 28 s (ref 24–36)

## 2024-01-07 LAB — ETHANOL: Alcohol, Ethyl (B): <10 mg/dL (ref <10)

## 2024-01-07 LAB — MAGNESIUM: Magnesium: 1.9 mg/dL (ref 1.7–2.4)

## 2024-01-07 LAB — C-REACTIVE PROTEIN: CRP: 0.8 mg/dL (ref <1.0)

## 2024-01-07 MED ORDER — IOHEXOL 350 MG/ML SOLN
75.0000 mL | Freq: Once | INTRAVENOUS | Status: AC | PRN
  Administered 2024-01-07: 75 mL via INTRAVENOUS

## 2024-01-07 MED ORDER — GADOBUTROL 1 MMOL/ML IV SOLN
8.0000 mL | Freq: Once | INTRAVENOUS | Status: AC | PRN
  Administered 2024-01-07: 8 mL via INTRAVENOUS

## 2024-01-07 MED ORDER — LORAZEPAM 2 MG/ML IJ SOLN
1.0000 mg | Freq: Once | INTRAMUSCULAR | Status: AC
  Administered 2024-01-07: 1 mg via INTRAVENOUS
  Filled 2024-01-07: qty 1

## 2024-01-07 MED ORDER — ATORVASTATIN CALCIUM 80 MG PO TABS
80.0000 mg | ORAL_TABLET | Freq: Every day | ORAL | Status: DC
  Administered 2024-01-08: 80 mg via ORAL
  Filled 2024-01-07: qty 1

## 2024-01-07 MED ORDER — CLOPIDOGREL BISULFATE 75 MG PO TABS
75.0000 mg | ORAL_TABLET | Freq: Every day | ORAL | Status: DC
  Administered 2024-01-08 – 2024-01-12 (×5): 75 mg via ORAL
  Filled 2024-01-07 (×5): qty 1

## 2024-01-07 MED ORDER — STROKE: EARLY STAGES OF RECOVERY BOOK
Freq: Once | Status: AC
  Filled 2024-01-07: qty 1

## 2024-01-07 MED ORDER — PROCHLORPERAZINE EDISYLATE 10 MG/2ML IJ SOLN
10.0000 mg | Freq: Once | INTRAMUSCULAR | Status: AC
  Administered 2024-01-07: 10 mg via INTRAVENOUS
  Filled 2024-01-07: qty 2

## 2024-01-07 MED ORDER — DIAZEPAM 2 MG PO TABS
2.0000 mg | ORAL_TABLET | Freq: Four times a day (QID) | ORAL | Status: AC | PRN
  Administered 2024-01-07 – 2024-01-08 (×2): 2 mg via ORAL
  Filled 2024-01-07 (×2): qty 1

## 2024-01-07 MED ORDER — PROCHLORPERAZINE EDISYLATE 10 MG/2ML IJ SOLN: 5.0000 mg | Freq: Four times a day (QID) | INTRAMUSCULAR | Status: DC | PRN

## 2024-01-07 MED ORDER — ENOXAPARIN SODIUM 40 MG/0.4ML IJ SOSY
40.0000 mg | PREFILLED_SYRINGE | Freq: Every day | INTRAMUSCULAR | Status: DC
  Administered 2024-01-08 – 2024-01-12 (×5): 40 mg via SUBCUTANEOUS
  Filled 2024-01-07 (×5): qty 0.4

## 2024-01-07 MED ORDER — LACTATED RINGERS IV SOLN: INTRAVENOUS | Status: AC

## 2024-01-07 MED ORDER — ACETAMINOPHEN 325 MG PO TABS
650.0000 mg | ORAL_TABLET | Freq: Four times a day (QID) | ORAL | Status: DC | PRN
  Administered 2024-01-10: 650 mg via ORAL
  Filled 2024-01-07 (×2): qty 2

## 2024-01-07 MED ORDER — CLOPIDOGREL BISULFATE 300 MG PO TABS
300.0000 mg | ORAL_TABLET | Freq: Once | ORAL | Status: AC
  Administered 2024-01-07: 300 mg via ORAL
  Filled 2024-01-07: qty 1

## 2024-01-07 MED ORDER — LACTATED RINGERS IV BOLUS
1000.0000 mL | Freq: Once | INTRAVENOUS | Status: AC
  Administered 2024-01-07: 1000 mL via INTRAVENOUS

## 2024-01-07 MED ORDER — LABETALOL HCL 5 MG/ML IV SOLN: 5.0000 mg | INTRAVENOUS | Status: DC | PRN

## 2024-01-07 MED ORDER — LACTATED RINGERS IV SOLN: INTRAVENOUS | Status: DC

## 2024-01-07 MED ORDER — POLYETHYLENE GLYCOL 3350 17 G PO PACK: 17.0000 g | PACK | Freq: Every day | ORAL | Status: DC | PRN

## 2024-01-07 MED ORDER — DIPHENHYDRAMINE HCL 50 MG/ML IJ SOLN
50.0000 mg | Freq: Once | INTRAMUSCULAR | Status: AC
  Administered 2024-01-07: 50 mg via INTRAVENOUS
  Filled 2024-01-07: qty 1

## 2024-01-07 MED ORDER — MELATONIN 5 MG PO TABS: 5.0000 mg | ORAL_TABLET | Freq: Every evening | ORAL | Status: DC | PRN

## 2024-01-07 NOTE — Code Documentation (Signed)
 Stroke Response Nurse Documentation Code Documentation  Faith Jordan is a 28 y.o. female arriving to [facility]  via [employer] on 01-07-24 with past medical hx of migraine. On No antithrombotic. Code stroke was activated by ED.   Patient from home where she was LKW at 8pm last night and now complaining of inability to move left side  Stroke team at the bedside on patient arrival. Labs drawn and patient cleared for CT by Dr. Doran Durand. Patient to CT with team. NIHSS 7, see documentation for details and code stroke times. Patient with left facial droop, left arm weakness, left leg weakness, and dysarthria  on exam. The following imaging was completed:  CT Head, CTA, and MRI. Patient is not a candidate for IV Thrombolytic due to last known well last night. Patient is not a candidate for IR due to no LVO on CTA.   Care Plan: VS and NIHSS q 2 hours x 12 hours then q 4 hours.   Bedside handoff with ED RN Mariel.    Marcellina Millin  Stroke Response RN

## 2024-01-07 NOTE — ED Notes (Signed)
 Patient transported to MRI

## 2024-01-07 NOTE — Consult Note (Addendum)
 NEUROLOGY CONSULT NOTE   Date of service: January 07, 2024 Patient Name: Faith Jordan MRN:  [id number] DOB:  20-Sep-1996 Chief Complaint: "Left sided weakness" Requesting Provider: Glyn Ade, MD  History of Present Illness  Faith Jordan is a 28 y.o. female with a PMHx as noted below (reviewed) presenting with acute onset of left hemiplegia and facial droop in the absence of headache. She has a history of familial hemiplegic migraine. Mother, aunt and cousin, all on maternal side have this diagnosis as well. LKN 8:00 PM yesterday. Code Stroke called and CTA/P ordered.   Patient states that she usually has about 1 headache per week. Out of the total migraine-like occurrences per year that she has, she estimates about 5% of them to be with left facial weakness only in the absence of headache, about 5% with left facial weakness + headache and about 90% of them with headache pain only. None of her complicated migraines have been associated with limb weakness, except for the current episode, her first.   LKW: 8:00 PM Friday Modified rankin score: 0-Completely asymptomatic and back to baseline post- stroke IV Thrombolysis: No: Outside of the time window EVT:  No: CTA negative for LVO   NIHSS components Score: Comment  1a Level of Conscious 0[x]  1[]  2[]  3[]      1b LOC Questions 0[x]  1[]  2[]       1c LOC Commands 0[x]  1[]  2[]       2 Best Gaze 0[x]  1[]  2[]       3 Visual 0[x]  1[]  2[]  3[]      4 Facial Palsy 0[]  1[x]  2[]  3[]      5a Motor Arm - left 0[]  1[]  2[]  3[x]  4[]  UN[]    5b Motor Arm - Right 0[x]  1[]  2[]  3[]  4[]  UN[]    6a Motor Leg - Left 0[]  1[x]  2[]  3[]  4[]  UN[]    6b Motor Leg - Right 0[x]  1[]  2[]  3[]  4[]  UN[]    7 Limb Ataxia 0[x]  1[]  2[]  3[]  UN[]     8 Sensory 0[x]  1[]  2[]  UN[]      9 Best Language 0[x]  1[]  2[]  3[]      10 Dysarthria 0[]  1[x]  2[]  UN[]      11 Extinct. and Inattention 0[x]  1[]  2[]       TOTAL:   6      ROS  As per HPI. Detailed ROS deferred due to acuity of  presentation.    Past History   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   Acute flank pain 06/02/2022   Angio-edema 04/27/2016   Angioedema    Aquagenic angio-edema-urticaria    Asthma    no problems /not used recently   Dysautonomia [facility])    Dysrhythmia    sinus tachycardia   Fibroid    right breast, adenoma   Headache(784.0)    History of COVID-19 11/21/2020   Hives 12/22/2015   Pneumonia    hx  6th grade   Sensation of fullness in both ears 02/02/2023   Snoring 02/11/2022   Urinary frequency 02/12/2019   Vaginal yeast infection 06/02/2022   Vasculitis (HCC)     Past Surgical History:  Procedure Laterality Date   ADENOIDECTOMY     BREAST LUMPECTOMY Right    BREAST LUMPECTOMY Right    MASS EXCISION Right 10/07/2014   Procedure: EXCISION OF RIGHT BREAST MASS;  Surgeon: Avel Peace, MD;  Location: [facility] OR;  Service: General;  Laterality: Right;   TONSILLECTOMY AND ADENOIDECTOMY  TOOTH EXTRACTION      Family History: Family History  Problem Relation Age of Onset   Depression Mother    Migraines Mother    Narcolepsy Mother    Hypothyroidism Mother    Myasthenia gravis Maternal Grandfather    Breast cancer Maternal Grandmother 61   Asthma Other    Cancer Other    Epilepsy Other    Allergic rhinitis Neg Hx    Angioedema Neg Hx    Atopy Neg Hx    Eczema Neg Hx    Immunodeficiency Neg Hx    Urticaria Neg Hx     Social History  reports that she has never smoked. She has never used smokeless tobacco. She reports that she does not drink alcohol and does not use drugs.  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, blistering peeling skin   Nsaids Dermatitis    Reaction similar to Stephens-Johnson, blistering peeling rash   Sulfa Antibiotics Swelling    Facial swelling    Medications   Current Facility-Administered Medications:    diphenhydrAMINE  (BENADRYL) injection 50 mg, 50 mg, Intravenous, Once, Countryman, Chase, MD   lactated ringers bolus 1,000 mL, 1,000 mL, Intravenous, Once, Countryman, Chase, MD   prochlorperazine (COMPAZINE) injection 10 mg, 10 mg, Intravenous, Once, Countryman, Chase, MD  Current Outpatient Medications:    FLUoxetine (PROZAC) 40 MG capsule, Take 1 capsule (40 mg total) by mouth daily., Disp: 90 capsule, Rfl: 3   Galcanezumab-gnlm (EMGALITY) 120 MG/ML SOAJ, Inject 120 mg into the skin every 30 (thirty) days., Disp: 3 mL, Rfl: 3   hydrOXYzine (ATARAX) 25 MG tablet, Take 1-2 tablets (25-50 mg total) by mouth at bedtime as needed., Disp: 60 tablet, Rfl: 5   Levonorgestrel-Ethinyl Estradiol (DAYSEE) 0.15-0.03 &0.01 MG tablet, Take 1 tablet by mouth daily., Disp: , Rfl:    LORazepam (ATIVAN) 0.5 MG tablet, Take 1 tablet (0.5 mg total) by mouth 2 (two) times daily as needed for anxiety and panic attacks., Disp: 60 tablet, Rfl: 2   Rimegepant Sulfate (NURTEC) 75 MG TBDP, Take 1 tablet (75 mg total) by mouth daily as needed (take for abortive therapy of migraine, no more than 1 tablet in 24 hours or 10 per month)., Disp: 8 tablet, Rfl: 11   topiramate (TOPAMAX) 50 MG tablet, Take 1 tablet (50 mg total) by mouth daily. May also take 1 tablet (50 mg total) daily as needed (daily during menstrual cycle)., Disp: 180 tablet, Rfl: 3   traZODone (DESYREL) 50 MG tablet, Take 1-3 tablets (50-150 mg total) by mouth at bedtime as needed for sleep., Disp: 270 tablet, Rfl: 3  Vitals   Vitals:   01/07/24 1648 01/07/24 1701  BP: 136/84   Pulse: (!) 103   Resp: 18   Temp: 98.5 F (36.9 C)   SpO2: 98%   Weight:  77.1 kg  Height:  5\' 7"  (1.702 m)    Body mass index is 26.63 kg/m.  Physical Exam   Physical Exam  HEENT:  [place]/AT. No neck stiffness Lungs: Respirations unlabored Extremities: Warm and well perfused.   Neurological Examination Mental Status: Awake and alert. Fully oriented. Speech fluent with intact naming and  comprehension. Mild dysarthria. Cranial Nerves: II: Visual fields intact bilaterally. No extinction to DSS.   III,IV, VI: No ptosis. EOMI. No nystagmus.  V: Temp sensation equal bilaterally VII: Mild left facial droop.  VIII: Hearing intact to conversation IX,X: No hoarseness or hypophonia XI: Shoulder  shrug is weak on the left XII: Midline tongue extension Motor: RUE and RLE 5/5 LUE 1/5 elbow flexion and extension. 0/5 deltoid, wrist flexion/extension, finger abduction/extension and grip LLE 4-/5 hip flexion, knee extension, knee flexion; 0/5 ADF, 3/5 APF Tone is normal to BLE and RUE, decreased to LUE Sensory: Temp and light touch intact throughout, bilaterally. No extinction to DSS.  Deep Tendon Reflexes: 2+ and symmetric bilateral triceps, patellae and achilles. 1+ bilateral brachioradialis and biceps. Toes downgoing. Negative Hoffman's bilaterally.  Cerebellar: No ataxia with FNF on the right. Unable to perform on the left.  Gait: Deferred   Labs/Imaging/Neurodiagnostic studies   CBC: No results for input(s): "WBC", "NEUTROABS", "HGB", "HCT", "MCV", "PLT" in the last 168 hours. Basic Metabolic Panel:  Lab Results  Component Value Date   NA 140 11/29/2023   K 4.2 11/29/2023   CO2 26 11/29/2023   GLUCOSE 103 (H) 11/29/2023   BUN 8 11/29/2023   CREATININE 0.67 11/29/2023   CALCIUM 8.9 11/29/2023   GFRNONAA >60 03/28/2023   GFRAA >60 12/01/2017   Lipid Panel:  Lab Results  Component Value Date   LDLCALC 91 11/29/2023   HgbA1c:  Lab Results  Component Value Date   HGBA1C 5.5 10/20/2022     ASSESSMENT  28 year old female with a PMHx significant for hemiplegic migraines, angioedema, dysautonomia and asthma presenting with acute onset of left hemiplegia and facial droop in the absence of headache. She has a history of familial hemiplegic migraine. Mother, aunt and cousin, all on maternal side have this diagnosis as well. LKN 8:00 PM yesterday. Code Stroke called and  CTA/P ordered. Patient states that she usually has about 1 headache per week. Out of the total migraine-like occurrences per year that she has, she estimates about 5% of them to be with left facial weakness only in the absence of headache, about 5% with left facial weakness + headache and about 90% of them with headache pain only. None of her complicated migraines have been associated with limb weakness, except for the current episode, her first.  - Exam reveals left sided motor deficits best referable to the right frontal lobe motor cortex.  - CT head is with no acute abnormality. Dense calcifications within the globus pallidus bilaterally; this is a nonspecific finding but can be seen in the setting of Fahr's disease.  - CTA of head and neck with normal caliber vessels and no LVO.  - Overall impression: Acute episode of hemiplegic migraine without associated headache pain. Although a small stroke or acute MS lesion are also possible, this is felt to be relatively unlikely. She is not a TNK candidate due to being outside of the treatment window. 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 Will obtain MRI brain.    RECOMMENDATIONS  - MRI brain with and without contrast.  - PTH level - Discussed with EDP.    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 Also noted are susceptibility-related calcifications in the globus pallidus bilaterally. This corresponds to the symmetric calcifications seen on CT. - Out of the TNK time window.  - Plavix 300 mg load now, followed by 75 mg po every day - Unable to administer allergy due to NSAID allergy with risk for developing anaphylaxis if cross-reactive.  - Frequent neuro checks - HgbA1c, fasting lipid panel - PT consult, OT consult, Speech consult - Echocardiogram - Start atorvastatin. Obtain baseline CK level -  Discontinue all medications containing estrogen  or estrogen analogues - Discontinue rimegepant (Nurtec) - Discontinue galcanezumab (Emgality) - Hypercoagulable panel in the AM - ESR and CRP (ordered) - Lupus panel in the AM - Vasculitis panel in the AM - Risk factor modification - Telemetry monitoring - NPO until passes stroke swallow screen - Dr. Wilford Corner has discussed the imaging results with the patient.     ______________________________________________________________________    Dessa Phi, Shakina Choy, MD [facility]

## 2024-01-07 NOTE — H&P (Addendum)
 History and Physical  Faith Jordan ZOX:[id number] DOB: Jul 20, 1996 DOA: 01/07/2024  Referring physician: Dr. Doran Durand, EDP  PCP: Doreene Nest, NP  Outpatient Specialists: Neurology Patient coming from: Home  Chief Complaint: Persistent left-sided weakness.  HPI: Faith Jordan is a 28 y.o. 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  Last known well at 8:00 PM on 01/06/2024.  She initially thought her left-sided weakness were related to her hemiplegic migraine.  However, they usually involve her face and not other parts of her body.  She took her migraine medications but her hemiaplasia persisted.  Today, she became concerned and presented to the ER for further evaluation.  A code stroke was called in the ER.  Seen by neurology/stroke team.  In the ER, noncontrast head CT showed no acute intracranial abnormality, dense calcification within the globus pallidus bilaterally, nonspecific finding that can be seen in the setting of Fahr's disease.  CT angio head and neck showing no evidence of LVO.  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  The patient was started on Plavix.  Admitted by [facility], hospitalist service, for a complete stroke workup.  ED Course: Temperature 98.  BP 120/86, pulse 112, respiratory 19, O2 saturation 100% on room air.  Lab studies notable for WBC 12.7, neutrophil count 10.2.  Serum bicarb 19.  Review of Systems: Review of systems as noted in the HPI. All other systems reviewed and are negative.   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   Acute flank pain 06/02/2022   Angio-edema 04/27/2016   Angioedema    Aquagenic  angio-edema-urticaria    Asthma    no problems /not used recently   Dysautonomia [facility])    Dysrhythmia    sinus tachycardia   Fibroid    right breast, adenoma   Headache(784.0)    History of COVID-19 11/21/2020   Hives 12/22/2015   Pneumonia    hx  6th grade   Sensation of fullness in both ears 02/02/2023   Snoring 02/11/2022   Urinary frequency 02/12/2019   Vaginal yeast infection 06/02/2022   Vasculitis (HCC)    Past Surgical History:  Procedure Laterality Date   ADENOIDECTOMY     BREAST LUMPECTOMY Right    BREAST LUMPECTOMY Right    MASS EXCISION Right 10/07/2014   Procedure: EXCISION OF RIGHT BREAST MASS;  Surgeon: Avel Peace, MD;  Location: [facility] OR;  Service: General;  Laterality: Right;   TONSILLECTOMY AND ADENOIDECTOMY     TOOTH EXTRACTION      Social History:  reports that she has never smoked. She has never used smokeless tobacco. She reports that she does not drink alcohol and does not use drugs.   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, blistering peeling skin   Nsaids Dermatitis    Reaction similar to Stephens-Johnson, blistering peeling rash   Sulfa Antibiotics Swelling    Facial swelling    Family History  Problem Relation Age of Onset   Depression Mother    Migraines Mother    Narcolepsy Mother    Hypothyroidism Mother    Myasthenia gravis Maternal Grandfather    Breast cancer Maternal Grandmother 25  Asthma Other    Cancer Other    Epilepsy Other    Allergic rhinitis Neg Hx    Angioedema Neg Hx    Atopy Neg Hx    Eczema Neg Hx    Immunodeficiency Neg Hx    Urticaria Neg Hx       Prior to Admission medications   Medication Sig Start Date End Date Taking? Authorizing Provider  FLUoxetine (PROZAC) 40 MG capsule Take 1 capsule (40 mg total) by mouth daily. 02/02/23   Mozingo, Thereasa Solo, NP  Galcanezumab-gnlm (EMGALITY) 120 MG/ML SOAJ Inject 120 mg into the skin every 30  (thirty) days. 11/21/23   Lomax, Amy, NP  hydrOXYzine (ATARAX) 25 MG tablet Take 1-2 tablets (25-50 mg total) by mouth at bedtime as needed. 06/06/23   Mozingo, Thereasa Solo, NP  Levonorgestrel-Ethinyl Estradiol (DAYSEE) 0.15-0.03 &0.01 MG tablet Take 1 tablet by mouth daily.    [provider]  LORazepam (ATIVAN) 0.5 MG tablet Take 1 tablet (0.5 mg total) by mouth 2 (two) times daily as needed for anxiety and panic attacks. 09/06/23   Mozingo, Thereasa Solo, NP  Rimegepant Sulfate (NURTEC) 75 MG TBDP Take 1 tablet (75 mg total) by mouth daily as needed (take for abortive therapy of migraine, no more than 1 tablet in 24 hours or 10 per month). 11/21/23   Lomax, Amy, NP  topiramate (TOPAMAX) 50 MG tablet Take 1 tablet (50 mg total) by mouth daily. May also take 1 tablet (50 mg total) daily as needed (daily during menstrual cycle). 11/21/23   Lomax, Amy, NP  traZODone (DESYREL) 50 MG tablet Take 1-3 tablets (50-150 mg total) by mouth at bedtime as needed for sleep. 02/02/23   Mozingo, Thereasa Solo, NP    Physical Exam: BP 128/77   Pulse 86   Temp 98 F (36.7 C) (Oral)   Resp 20   Ht 5\' 7"  (1.702 m)   Wt 77.1 kg   SpO2 100%   BMI 26.63 kg/m   General: 28 y.o. year-old female well developed well nourished in no acute distress.  Alert and oriented x3. Cardiovascular: Regular rate and rhythm with no rubs or gallops.  No thyromegaly or JVD noted.  No lower extremity edema. 2/4 pulses in all 4 extremities. Respiratory: Clear to auscultation with no wheezes or rales. Good inspiratory effort. Abdomen: Soft nontender nondistended with normal bowel sounds x4 quadrants. Muskuloskeletal: No cyanosis, clubbing or edema noted bilaterally Neuro: CN II-XII intact, left upper extremity 1 out of 5, left lower extremity 4 out of 5. Skin: No ulcerative lesions noted or rashes Psychiatry: Judgement and insight appear normal. Mood is appropriate for condition and setting          Labs on Admission:   Basic Metabolic Panel: Recent Labs  Lab 01/07/24 1749 01/07/24 1847  NA 136 138  K 3.7 3.7  CL 107 108  CO2 19*  --   GLUCOSE 97 95  BUN 10 10  CREATININE 0.72 0.70  CALCIUM 9.1  --   MG 1.9  --    Liver Function Tests: Recent Labs  Lab 01/07/24 1749  AST 19  ALT 14  ALKPHOS 52  BILITOT 0.7  PROT 6.9  ALBUMIN 3.7   No results for input(s): "LIPASE", "AMYLASE" in the last 168 hours. No results for input(s): "AMMONIA" in the last 168 hours. CBC: Recent Labs  Lab 01/07/24 1749 01/07/24 1847  WBC 12.7*  --   NEUTROABS 10.2*  --   HGB 14.6 15.0  HCT 43.5 44.0  MCV 85.6  --   PLT 355  --    Cardiac Enzymes: No results for input(s): "CKTOTAL", "CKMB", "CKMBINDEX", "TROPONINI" in the last 168 hours.  BNP (last 3 results) No results for input(s): "BNP" in the last 8760 hours.  ProBNP (last 3 results) No results for input(s): "PROBNP" in the last 8760 hours.  CBG: No results for input(s): "GLUCAP" in the last 168 hours.  Radiological Exams on Admission: MR BRAIN W WO CONTRAST Result Date: 01/07/2024 CLINICAL DATA:  New onset left-sided weakness. EXAM: MRI HEAD WITHOUT AND WITH CONTRAST TECHNIQUE: Multiplanar, multiecho pulse sequences of the brain and surrounding structures were obtained without and with intravenous contrast. CONTRAST:  8mL GADAVIST GADOBUTROL 1 MMOL/ML IV SOLN COMPARISON:  CT head without contrast and CT angio head and neck 01/07/2024. FINDINGS: Brain: A 10 mm acute nonhemorrhagic infarct is present in the posterior limb of the right internal capsule. T2 and FLAIR hyperintensities are consistent with the time frame nearly 24 hours. No acute hemorrhage or mass lesion is present. Susceptibility related calcifications in the globus pallidus noted bilaterally. Basal ganglia are otherwise within normal limits. The ventricles are of normal size. No significant extraaxial fluid collection is present. The brainstem and cerebellum are within normal limits.  Midline structures are within normal limits. Vascular: Flow is present in the major intracranial arteries. Skull and upper cervical spine: The craniocervical junction is normal. Upper cervical spine is within normal limits. Marrow signal is unremarkable. Sinuses/Orbits: The paranasal sinuses and mastoid air cells are clear. The globes and orbits are within normal limits. IMPRESSION: 1. 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 2. Susceptibility related calcifications in the globus pallidus bilaterally. This likely reflects the sequela of chronic microvascular ischemia. The above was relayed via text pager to Dr. Wilford Corner on 01/07/2024 at 20:52 . Electronically Signed   By: Marin Roberts M.D.   On: 01/07/2024 20:55   CT ANGIO HEAD NECK W WO CM (CODE STROKE) Result Date: 01/07/2024 CLINICAL DATA:  Left hemiplegia. EXAM: CT ANGIOGRAPHY HEAD AND NECK WITH AND WITHOUT CONTRAST TECHNIQUE: Multidetector CT imaging of the head and neck was performed using the standard protocol during bolus administration of intravenous contrast. Multiplanar CT image reconstructions and MIPs were obtained to evaluate the vascular anatomy. Carotid stenosis measurements (when applicable) are obtained utilizing NASCET criteria, using the distal internal carotid diameter as the denominator. RADIATION DOSE REDUCTION: This exam was performed according to the departmental dose-optimization program which includes automated exposure control, adjustment of the mA and/or kV according to patient size and/or use of iterative reconstruction technique. CONTRAST:  75mL OMNIPAQUE IOHEXOL 350 MG/ML SOLN COMPARISON:  CT head without contrast 01/07/2024 at 6:41 p.m. FINDINGS: CTA NECK FINDINGS Aortic arch: A 3 vessel arch configuration is present. No significant atherosclerotic changes are present at the aortic arch. Great vessel origins are widely patent. No aneurysm or dissection is present. Right carotid  system: The right common carotid artery is within normal limits. Bifurcation is within normal limits. The cervical right ICA is normal. Left carotid system: The left common carotid artery is within normal limits. Bifurcation is unremarkable. Cervical left ICA is normal. Vertebral arteries: The vertebral arteries are codominant. Both vertebral arteries originate from the subclavian arteries without significant stenosis. No significant stenosis is present in either vertebral artery in the neck. Skeleton: The vertebral body heights and alignment are normal. Mild reversal of the normal cervical lordosis may be positional. No focal  osseous lesions are present. Other neck: The soft tissues of the neck are otherwise unremarkable. Salivary glands are within normal limits. Thyroid is normal. No significant adenopathy is present. No focal mucosal or submucosal lesions are present. Upper chest: The lung apices are clear. The thoracic inlet is within normal limits. Review of the MIP images confirms the above findings CTA HEAD FINDINGS Anterior circulation: Internal carotid arteries are within normal limits from the skull base to the ICA termini. The A1 and M1 segments are normal. The anterior communicating artery is patent. MCA bifurcations are normal bilaterally. The ACA and MCA branch vessels are normal bilaterally. No aneurysm is present. Posterior circulation: PICA origins are visualized and normal. The vertebrobasilar junction and basilar artery are normal. The superior cerebellar arteries are patent bilaterally. Both posterior cerebral arteries originate from basilar tip. Small posterior communicating arteries are present. The PCA branch vessels are normal bilaterally. No aneurysm is present. Venous sinuses: The dural sinuses are patent. The straight sinus and deep cerebral veins are intact. Cortical veins are within normal limits. No significant vascular malformation is evident. Anatomic variants: None Review of the MIP  images confirms the above findings IMPRESSION: 1. Normal CTA of the neck. 2. Normal CTA Circle of Willis without significant proximal stenosis, aneurysm, or branch vessel occlusion. These results were called by telephone at the time of interpretation on 01/07/2024 at 7:00 pm to provider ERIC [facility] , who verbally acknowledged these results. Electronically Signed   By: Marin Roberts M.D.   On: 01/07/2024 19:05   CT HEAD CODE STROKE WO CONTRAST Result Date: 01/07/2024 CLINICAL DATA:  Code stroke. Left-sided paralysis. History of migraines. EXAM: CT HEAD WITHOUT CONTRAST TECHNIQUE: Contiguous axial images were obtained from the base of the skull through the vertex without intravenous contrast. RADIATION DOSE REDUCTION: This exam was performed according to the departmental dose-optimization program which includes automated exposure control, adjustment of the mA and/or kV according to patient size and/or use of iterative reconstruction technique. COMPARISON:  Report of CT head without contrast 04/28/2005. FINDINGS: Brain: Dense calcifications are present within the globus pallidus bilaterally. No acute infarct, hemorrhage, or mass lesion is present. No significant white matter lesions are present. The deep brain nuclei are otherwise within normal limits. The ventricles are of normal size. No significant extraaxial fluid collection is present. The brainstem and cerebellum are within normal limits. Midline structures are within normal limits. Vascular: No hyperdense vessel or unexpected calcification. Skull: Calvarium is intact. No focal lytic or blastic lesions are present. No significant extracranial soft tissue lesion is present. Sinuses/Orbits: The paranasal sinuses and mastoid air cells are clear. The globes and orbits are within normal limits. ASPECTS [facility] Stroke Program Early CT Score) - Ganglionic level infarction (caudate, lentiform nuclei, internal capsule, insula, M1-M3 cortex): 7/7 -  Supraganglionic infarction (M4-M6 cortex): 3/3 Total score (0-10 with 10 being normal): 10/10 IMPRESSION: 1. No acute intracranial abnormality or significant interval change. 2. Aspects is 10/10. 3. Dense calcifications within the globus pallidus bilaterally. This is a nonspecific finding but can be seen in the setting of Fahr's disease. ASPECTS is Electronically Signed   By: Marin Roberts M.D.   On: 01/07/2024 18:48    EKG: I independently viewed the EKG done and my findings are as followed: Sinus rhythm rate of 80.  Nonspecific ST-T changes.  QTc 442.  Assessment/Plan Present on Admission:  Acute CVA (cerebrovascular accident) [facility])  Principal Problem:   Acute CVA (cerebrovascular accident) (HCC)  Acute CVA Code stroke called  in the ED for left hemiparesis Last known well 8 PM on 01/06/2024, outside window for TNK -Noncontrast head CT showed no acute intracranial abnormality, dense calcification within the globus pallidus bilaterally, nonspecific finding that can be seen in the setting of Fahr's disease.  -CT angio head and neck showed no evidence of LVO.   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 -Follow 2D echo and monitor on telemetry. -Follow lipid panel/A1c Ongoing permissive hypertension Treat SBP greater than 220 or DBP greater than 120 IV labetalol as needed with parameters Frequent neurochecks PT/OT/speech therapist evaluation/fall precautions Antiplatelets per neurology/stroke team, currently on Plavix. Unable to administer aspirin due to NSAID allergy with risk for developing anaphylaxis if cross-reactive, per neurology/stroke team. Lipitor 80 mg daily Gentle IV fluid hydration LR 50 cc/h x 1 day. Inflammatory markers, lupus panel, vasculitis panel, hypercoagulable panel, per neurology/stroke team. Home Nurtec,  Emgality, and birth control pills, discontinued from discharge medications, per neurologist recommendation.  History of familial hemiplegic migraine Mother, maternal aunt, and maternal cousin have this diagnosis as well. Management per neurology  History of anxiety Medication reconciliation is pending.  Muscle spasms, acute Valium 2 mg every 6 hours as needed x 2 doses   Time: 75 minutes.   DVT prophylaxis: Subcu Lovenox daily  Code Status: Full code  Family Communication: Parents at bedside.  Disposition Plan: Admitted to telemetry medical unit.  Consults called: Neurology/stroke team.  Admission status: Inpatient status.   Status is: Inpatient The patient requires at least 2 midnights for further evaluation and treatment of present condition.   Darlin Drop MD [facility] Pager [phone]  If 7PM-7AM, please contact night-coverage [email or web address] Password [facility]  01/07/2024, 10:49 PM

## 2024-01-07 NOTE — ED Triage Notes (Signed)
 Pt c/o L side paralysis starting around midnight last night.  Denies pain and sensory deficits.  Hx of hemiplegic migraines, but they normally just effect her face.

## 2024-01-07 NOTE — ED Provider Notes (Signed)
 [place] EMERGENCY DEPARTMENT AT [facility] Provider Note   CSN: [id number] Arrival date & time: 01/07/24  1643  An emergency department physician performed an initial assessment on this suspected stroke patient at 65.  History Chief Complaint  Patient presents with   Paralysis    HPI Faith Jordan is a 28 y.o. female presenting for left sided weakness. She states that she went bed last night at 8:30 PM asymptomatic.  She woke up at 11:30 PM and could not use her left side.  She states she has a history of some hemiplegic migraines so she took her migraine medications and went back to sleep. She states that she is could still not use her left side when she woke up this morning.  Took more medications without improvement comes in this evening for further care and management. She is approximately 20 hours out from the start of symptoms. No history of similar severity. Patient's recorded medical, surgical, social, medication list and allergies were reviewed in the Snapshot window as part of the initial history.   Review of Systems   Review of Systems  Constitutional:  Negative for chills and fever.  HENT:  Negative for ear pain and sore throat.   Eyes:  Negative for pain and visual disturbance.  Respiratory:  Negative for cough and shortness of breath.   Cardiovascular:  Negative for chest pain and palpitations.  Gastrointestinal:  Negative for abdominal pain and vomiting.  Genitourinary:  Negative for dysuria and hematuria.  Musculoskeletal:  Negative for arthralgias and back pain.  Skin:  Negative for color change and rash.  Neurological:  Positive for weakness. Negative for seizures and syncope.  All other systems reviewed and are negative.   Physical Exam Updated Vital Signs BP 128/77   Pulse 86   Temp 98 F (36.7 C) (Oral)   Resp 20   Ht 5\' 7"  (1.702 m)   Wt 77.1 kg   SpO2 100%   BMI 26.63 kg/m  Physical Exam Vitals and nursing note reviewed.   Constitutional:      General: She is not in acute distress.    Appearance: She is well-developed.  HENT:     Head: Normocephalic and atraumatic.  Eyes:     Conjunctiva/sclera: Conjunctivae normal.  Cardiovascular:     Rate and Rhythm: Normal rate and regular rhythm.     Heart sounds: No murmur heard. Pulmonary:     Effort: Pulmonary effort is normal. No respiratory distress.     Breath sounds: Normal breath sounds.  Abdominal:     General: There is no distension.     Palpations: Abdomen is soft.     Tenderness: There is no abdominal tenderness. There is no right CVA tenderness or left CVA tenderness.  Musculoskeletal:        General: No swelling or tenderness. Normal range of motion.     Cervical back: Neck supple.  Skin:    General: Skin is warm and dry.  Neurological:     General: No focal deficit present.     Mental Status: She is alert and oriented to person, place, and time. Mental status is at baseline.     Cranial Nerves: No cranial nerve deficit.     Motor: Weakness present.      ED Course/ Medical Decision Making/ A&P Clinical Course as of 01/07/24 2235  Sat Jan 07, 2024  1747 Paged neurology [CC]  1751 VAN negative [CC]    Clinical Course User Index [CC]  Glyn Ade, MD    Procedures .Critical Care  Performed by: Glyn Ade, MD Authorized by: Glyn Ade, MD   Critical care provider statement:    Critical care time (minutes):  80   Critical care was necessary to treat or prevent imminent or life-threatening deterioration of the following conditions:  CNS failure or compromise   Critical care was time spent personally by me on the following activities:  Development of treatment plan with patient or surrogate, discussions with consultants, evaluation of patient's response to treatment, examination of patient, ordering and review of laboratory studies, ordering and review of radiographic studies, ordering and performing treatments and  interventions, pulse oximetry, re-evaluation of patient's condition and review of old charts   Care discussed with: admitting provider      Medications Ordered in ED Medications  clopidogrel (PLAVIX) tablet 300 mg (has no administration in time range)  clopidogrel (PLAVIX) tablet 75 mg (has no administration in time range)  lactated ringers bolus 1,000 mL (1,000 mLs Intravenous New Bag/Given 01/07/24 2057)  prochlorperazine (COMPAZINE) injection 10 mg (10 mg Intravenous Given 01/07/24 2049)  diphenhydrAMINE (BENADRYL) injection 50 mg (50 mg Intravenous Given 01/07/24 2043)  iohexol (OMNIPAQUE) 350 MG/ML injection 75 mL (75 mLs Intravenous Contrast Given 01/07/24 1854)  gadobutrol (GADAVIST) 1 MMOL/ML injection 8 mL (8 mLs Intravenous Contrast Given 01/07/24 2024)  LORazepam (ATIVAN) injection 1 mg (1 mg Intravenous Given 01/07/24 2130)   Medical Decision Making:    Faith Jordan is a 28 y.o. female who presented to the ED today with left sided weakness.  Due to these symptoms, I activated a CODE STROKE per hospital protocol.   Additional history discussed with patient's family/caregivers.  Patient placed on continuous vitals and telemetry monitoring while in ED which was reviewed periodically.   On my initial exam, the pt was in no acute distress, glucose was WNL and deficits include LSW.  Deficits are + persistent.   Reviewed and confirmed nursing documentation for past medical history, family history, social history.   Notably an effort was made to reach out to contacts regarding the following historical factors concerning contraindications for TPA: Not a candidate due to timing of symptoms.   Initial Assessment and Plan:   Patient immediately evaluated jointly by neurology and emergency department providers.   This is most consistent with an acute life/limb threatening illness complicated by underlying chronic conditions.  Patient's HPI and PE findings are most consistent with completed  CVA. Proceeded with [facility] and CTA per institutional policy which revealed NAA. MRI demonstrated infarct. Given the nature/timing of the symptoms, delayed presentation, patient is not a candidate for interventions. Patient stabilized in ED while being arranged for medical admission.   Radiology  Images reviewed independently, agree with radiology report at this time.   MR BRAIN W WO CONTRAST  Final Result    CT ANGIO HEAD NECK W WO CM (CODE STROKE)  Final Result    CT HEAD CODE STROKE WO CONTRAST  Final Result        Final Assessment and Plan:   Neurology informed patient at bedside of the diagnosis. They recommended medical admission for further care and management as well as stabilization.  Disposition:   Based on the above findings, I believe this patient is stable for admission.    Patient/family educated about specific findings on our evaluation and explained exact reasons for admission.  Patient/family educated about clinical situation and time was allowed to answer questions.   Admission team communicated with  and agreed with need for admission. Patient admitted. Patient ready to move at this time.     Emergency Department Medication Summary:   Medications  clopidogrel (PLAVIX) tablet 300 mg (has no administration in time range)  clopidogrel (PLAVIX) tablet 75 mg (has no administration in time range)  lactated ringers bolus 1,000 mL (1,000 mLs Intravenous New Bag/Given 01/07/24 2057)  prochlorperazine (COMPAZINE) injection 10 mg (10 mg Intravenous Given 01/07/24 2049)  diphenhydrAMINE (BENADRYL) injection 50 mg (50 mg Intravenous Given 01/07/24 2043)  iohexol (OMNIPAQUE) 350 MG/ML injection 75 mL (75 mLs Intravenous Contrast Given 01/07/24 1854)  gadobutrol (GADAVIST) 1 MMOL/ML injection 8 mL (8 mLs Intravenous Contrast Given 01/07/24 2024)  LORazepam (ATIVAN) injection 1 mg (1 mg Intravenous Given 01/07/24 2130)          Clinical Impression:  1. Cerebrovascular accident  (CVA), unspecified mechanism (HCC)      Admit   Final Clinical Impression(s) / ED Diagnoses Final diagnoses:  Cerebrovascular accident (CVA), unspecified mechanism (HCC)    Rx / DC Orders ED Discharge Orders     None         Glyn Ade, MD 01/07/24 2235

## 2024-01-08 ENCOUNTER — Inpatient Hospital Stay (HOSPITAL_COMMUNITY)

## 2024-01-08 DIAGNOSIS — I6389 Other cerebral infarction: Secondary | ICD-10-CM

## 2024-01-08 DIAGNOSIS — R29708 NIHSS score 8: Secondary | ICD-10-CM | POA: Diagnosis not present

## 2024-01-08 DIAGNOSIS — E785 Hyperlipidemia, unspecified: Secondary | ICD-10-CM

## 2024-01-08 DIAGNOSIS — I639 Cerebral infarction, unspecified: Secondary | ICD-10-CM | POA: Diagnosis not present

## 2024-01-08 DIAGNOSIS — F1090 Alcohol use, unspecified, uncomplicated: Secondary | ICD-10-CM

## 2024-01-08 LAB — BASIC METABOLIC PANEL
Anion gap: 11 (ref 5–15)
BUN: 7 mg/dL (ref 6–20)
CO2: 20 mmol/L — ABNORMAL LOW (ref 22–32)
Calcium: 8.6 mg/dL — ABNORMAL LOW (ref 8.9–10.3)
Chloride: 106 mmol/L (ref 98–111)
Creatinine, Ser: 0.67 mg/dL (ref 0.44–1.00)
GFR, Estimated: >60 mL/min (ref >60)
Glucose, Bld: 102 mg/dL — ABNORMAL HIGH (ref 70–99)
Potassium: 3.3 mmol/L — ABNORMAL LOW (ref 3.5–5.1)
Sodium: 137 mmol/L (ref 135–145)

## 2024-01-08 LAB — CBC
HCT: 39.4 % (ref 36.0–46.0)
Hemoglobin: 13.4 g/dL (ref 12.0–15.0)
MCH: 29.1 pg (ref 26.0–34.0)
MCHC: 34 g/dL (ref 30.0–36.0)
MCV: 85.7 fL (ref 80.0–100.0)
Platelets: 311 10*3/uL (ref 150–400)
RBC: 4.6 MIL/uL (ref 3.87–5.11)
RDW: 13.4 % (ref 11.5–15.5)
WBC: 10.9 10*3/uL — ABNORMAL HIGH (ref 4.0–10.5)
nRBC: 0 % (ref 0.0–0.2)

## 2024-01-08 LAB — HEMOGLOBIN A1C
Hgb A1c MFr Bld: 4.9 % (ref 4.8–5.6)
Mean Plasma Glucose: 93.93 mg/dL

## 2024-01-08 LAB — HIV ANTIBODY (ROUTINE TESTING W REFLEX): HIV Screen 4th Generation wRfx: NONREACTIVE

## 2024-01-08 LAB — CBG MONITORING, ED: Glucose-Capillary: 91 mg/dL (ref 70–99)

## 2024-01-08 LAB — ANTITHROMBIN III: AntiThromb III Func: 105 % (ref 75–120)

## 2024-01-08 LAB — LIPID PANEL
Cholesterol: 151 mg/dL (ref 0–200)
HDL: 30 mg/dL — ABNORMAL LOW (ref >40)
LDL Cholesterol: 106 mg/dL — ABNORMAL HIGH (ref 0–99)
Total CHOL/HDL Ratio: 5 ratio
Triglycerides: 73 mg/dL (ref <150)
VLDL: 15 mg/dL (ref 0–40)

## 2024-01-08 LAB — ECHOCARDIOGRAM COMPLETE
Area-P 1/2: 5.27 cm2
Height: 67 in
S' Lateral: 2.6 cm
Weight: 2720 [oz_av]

## 2024-01-08 LAB — PHOSPHORUS: Phosphorus: 3.1 mg/dL (ref 2.5–4.6)

## 2024-01-08 LAB — MAGNESIUM: Magnesium: 1.7 mg/dL (ref 1.7–2.4)

## 2024-01-08 MED ORDER — ATORVASTATIN CALCIUM 80 MG PO TABS
80.0000 mg | ORAL_TABLET | Freq: Every day | ORAL | Status: DC
  Administered 2024-01-09 – 2024-01-12 (×4): 80 mg via ORAL
  Filled 2024-01-08 (×4): qty 1

## 2024-01-08 MED ORDER — ATORVASTATIN CALCIUM 80 MG PO TABS: 80.0000 mg | ORAL_TABLET | Freq: Every day | ORAL | Status: DC

## 2024-01-08 MED ORDER — TOPIRAMATE 25 MG PO TABS: 50.0000 mg | ORAL_TABLET | Freq: Every day | ORAL | Status: DC | PRN

## 2024-01-08 MED ORDER — MELATONIN 5 MG PO TABS
5.0000 mg | ORAL_TABLET | Freq: Every day | ORAL | Status: DC
  Administered 2024-01-08 – 2024-01-11 (×3): 5 mg via ORAL
  Filled 2024-01-08 (×3): qty 1

## 2024-01-08 MED ORDER — POTASSIUM CHLORIDE CRYS ER 20 MEQ PO TBCR
60.0000 meq | EXTENDED_RELEASE_TABLET | Freq: Once | ORAL | Status: AC
  Administered 2024-01-08: 60 meq via ORAL
  Filled 2024-01-08: qty 3

## 2024-01-08 MED ORDER — MAGNESIUM SULFATE 2 GM/50ML IV SOLN
2.0000 g | Freq: Once | INTRAVENOUS | Status: AC
  Administered 2024-01-08: 2 g via INTRAVENOUS
  Filled 2024-01-08: qty 50

## 2024-01-08 MED ORDER — TOPIRAMATE 25 MG PO TABS
50.0000 mg | ORAL_TABLET | Freq: Every day | ORAL | Status: DC
  Administered 2024-01-08 – 2024-01-12 (×5): 50 mg via ORAL
  Filled 2024-01-08 (×5): qty 2

## 2024-01-08 MED ORDER — LORAZEPAM 1 MG PO TABS
1.0000 mg | ORAL_TABLET | Freq: Four times a day (QID) | ORAL | Status: DC | PRN
  Administered 2024-01-08: 1 mg via ORAL
  Filled 2024-01-08: qty 1

## 2024-01-08 MED ORDER — FLUOXETINE HCL 20 MG PO CAPS
40.0000 mg | ORAL_CAPSULE | Freq: Every day | ORAL | Status: DC
  Administered 2024-01-08 – 2024-01-12 (×5): 40 mg via ORAL
  Filled 2024-01-08 (×5): qty 2

## 2024-01-08 MED ORDER — LORAZEPAM 0.5 MG PO TABS
0.5000 mg | ORAL_TABLET | Freq: Three times a day (TID) | ORAL | Status: DC | PRN
  Administered 2024-01-08: 0.5 mg via ORAL
  Filled 2024-01-08: qty 1

## 2024-01-08 MED ORDER — LOPERAMIDE HCL 2 MG PO CAPS
2.0000 mg | ORAL_CAPSULE | ORAL | Status: DC | PRN
Start: 2024-01-08 — End: 2024-01-12
  Administered 2024-01-08 – 2024-01-11 (×2): 2 mg via ORAL
  Filled 2024-01-08 (×2): qty 1

## 2024-01-08 MED ORDER — TRAZODONE HCL 100 MG PO TABS
100.0000 mg | ORAL_TABLET | Freq: Every evening | ORAL | Status: DC | PRN
  Administered 2024-01-08 – 2024-01-11 (×4): 100 mg via ORAL
  Filled 2024-01-08 (×4): qty 1

## 2024-01-08 NOTE — Progress Notes (Addendum)
 Physical Therapy Evaluation Patient Details Name: Faith Jordan MRN: [id number] DOB: 23-Oct-1995 Today's Date: 01/08/2024  History of Present Illness  28 y.o. female presents to [facility] 01/07/24 with L sided weakness, Code Stroke called. 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 PMHx: familial hemiplegic migraine, angioedema 2017, dysautonomia   Clinical Impression  Pt in bed upon arrival with mother present and agreeable to PT eval. PTA, pt was independent with mobility and working full time as a Engineer, civil (consulting) at [employer]. Pt presents with decreased L LE strength/coordination, decreased L UE strength, impaired dynamic balance, decreased vision, and impaired mobility. Pt was able to stand and perform step-pivot to the [facility] with MinA to block LLE due to occasional buckling. Pt currently with functional limitations due to the deficits listed below (see PT Problem List). Pt would benefit from acute skilled PT to address functional impairments. Recommending post-acute rehab >3hrs to work towards independence with mobility. Pt has 24/7 physical assist available, strong motivation, and was very active prior to admission. Anticipate pt will progress well with intensive rehab. Acute PT to follow.         If plan is discharge home, recommend the following: A lot of help with walking and/or transfers;A lot of help with bathing/dressing/bathroom;Assistance with cooking/housework;Assist for transportation;Help with stairs or ramp for entrance     Equipment Recommendations Wheelchair (measurements PT);Wheelchair cushion (measurements PT);Other (comment) (TBD)  Recommendations for Other Services  Rehab consult    Functional Status Assessment Patient has had a recent decline in their functional status and demonstrates the ability to make significant improvements in function in a reasonable and predictable amount of time.     Precautions / Restrictions  Precautions Precautions: Fall Restrictions Weight Bearing Restrictions Per Provider Order: No      Mobility  Bed Mobility Overal bed mobility: Needs Assistance Bed Mobility: Supine to Sit, Sit to Supine    Supine to sit: Supervision Sit to supine: Supervision   General bed mobility comments: supervision for safety on stretcher    Transfers Overall transfer level: Needs assistance Equipment used: 1 person hand held assist Transfers: Sit to/from Stand, Bed to chair/wheelchair/BSC Sit to Stand: Min assist   Step pivot transfers: Min assist     General transfer comment: MinA to block L LE due to occasional buckling, able to take steps to [facility] while reaching for hand rail    Modified Rankin (Stroke Patients Only) Modified Rankin (Stroke Patients Only) Pre-Morbid Rankin Score: No symptoms Modified Rankin: Moderately severe disability     Balance Overall balance assessment: Needs assistance, Mild deficits observed, not formally tested Sitting-balance support: Feet supported, Single extremity supported Sitting balance-Leahy Scale: Fair Sitting balance - Comments: requires UE support when performing dynamic tasks   Standing balance support: Single extremity supported, During functional activity, Reliant on assistive device for balance Standing balance-Leahy Scale: Poor Standing balance comment: requires MinA to stand to block LLE        Pertinent Vitals/Pain Pain Assessment Pain Assessment: No/denies pain    Home Living Family/patient expects to be discharged to:: Private residence Living Arrangements: Alone Available Help at Discharge: Family;Available 24 hours/day Type of Home: House Home Access: Stairs to enter Entrance Stairs-Rails: Doctor, general practice of Steps: 3   Home Layout: Multi-level;Able to live on main level with bedroom/bathroom Home Equipment: BSC/3in1;Shower seat Additional Comments: History for mother's house where she would discharge  from the hospital    Prior Function Prior Level of Function :  Independent/Modified Independent;Driving;Working/employed    Mobility Comments: works as a Engineer, civil (consulting) at [employer], 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 Had multiple falls leading up to admission ADLs Comments: ind     Extremity/Trunk Assessment   Upper Extremity Assessment Upper Extremity Assessment: Defer to OT evaluation    Lower Extremity Assessment Lower Extremity Assessment: RLE deficits/detail;LLE deficits/detail RLE Deficits / Details: WFL RLE Sensation: WNL LLE Deficits / Details: hip flexion 3/5, knee ext 4/5, ankle DF 4+/5. LLE Sensation: WNL LLE Coordination: decreased gross motor    Cervical / Trunk Assessment Cervical / Trunk Assessment: Normal  Communication   Communication Communication: Impaired Factors Affecting Communication: Reduced clarity of speech (soft spoken, increased time and effort to speak)    Cognition Arousal: Alert Behavior During Therapy: WFL for tasks assessed/performed (tearful)   PT - Cognitive impairments: No apparent impairments    Following commands: Intact       Cueing Cueing Techniques: Verbal cues, Tactile cues     General Comments General comments (skin integrity, edema, etc.): Mother present during session and supportive. Propped L UE on pillows at end of session. Discussed CIR and HEP to work on during the day.    Exercises General Exercises - Lower Extremity Ankle Circles/Pumps: AROM, Left, 10 reps, Seated Long Arc Quad: AROM, Left, 5 reps, Seated Heel Slides: AROM, Left, 10 reps, Supine Hip ABduction/ADduction: AROM, Left, 10 reps, Supine Straight Leg Raises: AROM, Left, 5 reps, Supine Hip Flexion/Marching: AROM, Left, 5 reps, Seated   Assessment/Plan    PT Assessment Patient needs continued PT services  PT Problem List Decreased strength;Decreased range of motion;Decreased activity tolerance;Decreased balance;Decreased mobility;Decreased coordination       PT Treatment  Interventions DME instruction;Gait training;Stair training;Functional mobility training;Therapeutic activities;Therapeutic exercise;Balance training;Neuromuscular re-education;Patient/family education    PT Goals (Current goals can be found in the Care Plan section)  Acute Rehab PT Goals Patient Stated Goal: to be able to walk PT Goal Formulation: With patient/family Time For Goal Achievement: 01/22/24 Potential to Achieve Goals: Good    Frequency Min 3X/week        AM-PAC PT "6 Clicks" Mobility  Outcome Measure Help needed turning from your back to your side while in a flat bed without using bedrails?: None Help needed moving from lying on your back to sitting on the side of a flat bed without using bedrails?: A Little Help needed moving to and from a bed to a chair (including a wheelchair)?: A Little Help needed standing up from a chair using your arms (e.g., wheelchair or bedside chair)?: A Little Help needed to walk in hospital room?: Total Help needed climbing 3-5 steps with a railing? 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

## 2024-01-08 NOTE — ED Notes (Signed)
 Breakfast tray ordered for patient.

## 2024-01-08 NOTE — ED Notes (Signed)
Pt being transported to echo.

## 2024-01-08 NOTE — ED Notes (Signed)
 Patient is currently with PT at bedside

## 2024-01-08 NOTE — Progress Notes (Signed)
 Inpatient Rehab Admissions Coordinator Note:   Per PT patient was screened for CIR candidacy by Aiken Withem Luvenia Starch, CCC-SLP. At this time, pt appears to be a potential candidate for CIR. I will place an order for rehab consult for full assessment, per our protocol.  Please contact me any with questions.Wolfgang Phoenix, MS, CCC-SLP Admissions Coordinator [phone] 01/08/24 11:38 AM

## 2024-01-08 NOTE — H&P (View-Only) (Signed)
 STROKE TEAM PROGRESS NOTE   INTERIM HISTORY/SUBJECTIVE  Patient is awake and alert in no apparent distress.  Mom is at the bedside.  Patient states that on Friday she developed left side weakness and facial droop and she thought it was one of her migraines however did not have headache at the time, but took her migraine medications.  Symptoms persisted so she came to be evaluated in the emergency room last evening  MRI 1 cm right internal capsule nonhemorrhagic infarct CTA brain and neck normal CBC    Component Value Date/Time   WBC 10.9 (H) 01/07/2024 2328   RBC 4.60 01/07/2024 2328   HGB 13.4 01/07/2024 2328   HGB 11.8 09/28/2017 1459   HCT 39.4 01/07/2024 2328   HCT 36.7 09/28/2017 1459   PLT 311 01/07/2024 2328   PLT 368 09/28/2017 1459   MCV 85.7 01/07/2024 2328   MCV 88 09/28/2017 1459   MCH 29.1 01/07/2024 2328   MCHC 34.0 01/07/2024 2328   RDW 13.4 01/07/2024 2328   RDW 13.1 09/28/2017 1459   LYMPHSABS 1.8 01/07/2024 1749   LYMPHSABS 2.8 09/28/2017 1459   MONOABS 0.6 01/07/2024 1749   EOSABS 0.0 01/07/2024 1749   EOSABS 0.0 09/28/2017 1459   BASOSABS 0.0 01/07/2024 1749   BASOSABS 0.0 09/28/2017 1459    BMET    Component Value Date/Time   NA 137 01/07/2024 2328   NA 142 09/28/2017 1459   K 3.3 (L) 01/07/2024 2328   CL 106 01/07/2024 2328   CO2 20 (L) 01/07/2024 2328   GLUCOSE 102 (H) 01/07/2024 2328   BUN 7 01/07/2024 2328   BUN 3 (L) 09/28/2017 1459   CREATININE 0.67 01/07/2024 2328   CALCIUM 8.6 (L) 01/07/2024 2328   GFRNONAA >60 01/07/2024 2328    IMAGING past 24 hours ECHOCARDIOGRAM COMPLETE Result Date: 01/08/2024    ECHOCARDIOGRAM REPORT   Patient Name:   Faith Jordan Date of Exam: 01/08/2024 Medical Rec #:  [id number]      Height:       67.0 in Accession #:    [id number]     Weight:       170.0 lb Date of Birth:  22-Jan-1996      BSA:          1.887 m Patient Age:    28 years       BP:           132/74 mmHg Patient Gender: F              HR:            87 bpm. Exam Location:  Inpatient Procedure: 2D Echo, Color Doppler and Cardiac Doppler (Both Spectral and Color            Flow Doppler were utilized during procedure). Indications:    Stroke  History:        Patient has prior history of Echocardiogram examinations, most                 recent 02/10/2021.  Sonographer:    Rosaland Lao Referring Phys: [id number] CAROLE N HALL IMPRESSIONS  1. Left ventricular ejection fraction, by estimation, is 60 to 65%. The left ventricle has normal function. The left ventricle has no regional wall motion abnormalities. Left ventricular diastolic parameters were normal.  2. Right ventricular systolic function is normal. The right ventricular size is normal.  3. The mitral valve is normal in structure. Trivial mitral valve regurgitation. No evidence of  mitral stenosis.  4. The aortic valve is normal in structure. Aortic valve regurgitation is not visualized. No aortic stenosis is present.  5. The inferior vena cava is normal in size with greater than 50% respiratory variability, suggesting right atrial pressure of 3 mmHg. Conclusion(s)/Recommendation(s): Normal biventricular function without evidence of hemodynamically significant valvular heart disease. FINDINGS  Left Ventricle: Left ventricular ejection fraction, by estimation, is 60 to 65%. The left ventricle has normal function. The left ventricle has no regional wall motion abnormalities. The left ventricular internal cavity size was normal in size. There is  no left ventricular hypertrophy. Left ventricular diastolic parameters were normal. Right Ventricle: The right ventricular size is normal. No increase in right ventricular wall thickness. Right ventricular systolic function is normal. Left Atrium: Left atrial size was normal in size. Right Atrium: Right atrial size was normal in size. Pericardium: There is no evidence of pericardial effusion. Mitral Valve: The mitral valve is normal in structure. Trivial mitral valve  regurgitation. No evidence of mitral valve stenosis. Tricuspid Valve: The tricuspid valve is normal in structure. Tricuspid valve regurgitation is not demonstrated. No evidence of tricuspid stenosis. Aortic Valve: The aortic valve is normal in structure. Aortic valve regurgitation is not visualized. No aortic stenosis is present. Pulmonic Valve: The pulmonic valve was normal in structure. Pulmonic valve regurgitation is not visualized. No evidence of pulmonic stenosis. Aorta: The aortic root is normal in size and structure. Venous: The inferior vena cava is normal in size with greater than 50% respiratory variability, suggesting right atrial pressure of 3 mmHg. IAS/Shunts: No atrial level shunt detected by color flow Doppler.  LEFT VENTRICLE PLAX 2D LVIDd:         4.20 cm   Diastology LVIDs:         2.60 cm   LV e' medial:    11.90 cm/s LV PW:         0.70 cm   LV E/e' medial:  8.0 LV IVS:        0.80 cm   LV e' lateral:   17.00 cm/s LVOT diam:     1.90 cm   LV E/e' lateral: 5.6 LV SV:         48 LV SV Index:   26 LVOT Area:     2.84 cm  RIGHT VENTRICLE             IVC RV Basal diam:  3.00 cm     IVC diam: 1.10 cm RV S prime:     13.90 cm/s TAPSE (M-mode): 2.0 cm LEFT ATRIUM             Index        RIGHT ATRIUM          Index LA diam:        3.20 cm 1.70 cm/m   RA Area:     9.40 cm LA Vol (A2C):   32.4 ml 17.17 ml/m  RA Volume:   17.60 ml 9.33 ml/m LA Vol (A4C):   31.0 ml 16.43 ml/m LA Biplane Vol: 31.6 ml 16.74 ml/m  AORTIC VALVE LVOT Vmax:   84.60 cm/s LVOT Vmean:  57.700 cm/s LVOT VTI:    0.170 m  AORTA Ao Root diam: 2.20 cm Ao Asc diam:  2.40 cm MITRAL VALVE MV Area (PHT): 5.27 cm    SHUNTS MV Decel Time: 144 msec    Systemic VTI:  0.17 m MV E velocity: 95.00 cm/s  Systemic Diam: 1.90 cm MV A  velocity: 62.20 cm/s MV E/A ratio:  1.53 Donato Schultz MD Electronically signed by Donato Schultz MD Signature Date/Time: 01/08/2024/10:00:37 AM    Final    MR BRAIN W WO CONTRAST Result Date: 01/07/2024 CLINICAL  DATA:  New onset left-sided weakness. EXAM: MRI HEAD WITHOUT AND WITH CONTRAST TECHNIQUE: Multiplanar, multiecho pulse sequences of the brain and surrounding structures were obtained without and with intravenous contrast. CONTRAST:  8mL GADAVIST GADOBUTROL 1 MMOL/ML IV SOLN COMPARISON:  CT head without contrast and CT angio head and neck 01/07/2024. FINDINGS: Brain: A 10 mm acute nonhemorrhagic infarct is present in the posterior limb of the right internal capsule. T2 and FLAIR hyperintensities are consistent with the time frame nearly 24 hours. No acute hemorrhage or mass lesion is present. Susceptibility related calcifications in the globus pallidus noted bilaterally. Basal ganglia are otherwise within normal limits. The ventricles are of normal size. No significant extraaxial fluid collection is present. The brainstem and cerebellum are within normal limits. Midline structures are within normal limits. Vascular: Flow is present in the major intracranial arteries. Skull and upper cervical spine: The craniocervical junction is normal. Upper cervical spine is within normal limits. Marrow signal is unremarkable. Sinuses/Orbits: The paranasal sinuses and mastoid air cells are clear. The globes and orbits are within normal limits. IMPRESSION: 1. 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 2. Susceptibility related calcifications in the globus pallidus bilaterally. This likely reflects the sequela of chronic microvascular ischemia. The above was relayed via text pager to Dr. Wilford Corner on 01/07/2024 at 20:52 . Electronically Signed   By: Marin Roberts M.D.   On: 01/07/2024 20:55   CT ANGIO HEAD NECK W WO CM (CODE STROKE) Result Date: 01/07/2024 CLINICAL DATA:  Left hemiplegia. EXAM: CT ANGIOGRAPHY HEAD AND NECK WITH AND WITHOUT CONTRAST TECHNIQUE: Multidetector CT imaging of the head and neck was performed using the standard protocol during bolus administration of  intravenous contrast. Multiplanar CT image reconstructions and MIPs were obtained to evaluate the vascular anatomy. Carotid stenosis measurements (when applicable) are obtained utilizing NASCET criteria, using the distal internal carotid diameter as the denominator. RADIATION DOSE REDUCTION: This exam was performed according to the departmental dose-optimization program which includes automated exposure control, adjustment of the mA and/or kV according to patient size and/or use of iterative reconstruction technique. CONTRAST:  75mL OMNIPAQUE IOHEXOL 350 MG/ML SOLN COMPARISON:  CT head without contrast 01/07/2024 at 6:41 p.m. FINDINGS: CTA NECK FINDINGS Aortic arch: A 3 vessel arch configuration is present. No significant atherosclerotic changes are present at the aortic arch. Great vessel origins are widely patent. No aneurysm or dissection is present. Right carotid system: The right common carotid artery is within normal limits. Bifurcation is within normal limits. The cervical right ICA is normal. Left carotid system: The left common carotid artery is within normal limits. Bifurcation is unremarkable. Cervical left ICA is normal. Vertebral arteries: The vertebral arteries are codominant. Both vertebral arteries originate from the subclavian arteries without significant stenosis. No significant stenosis is present in either vertebral artery in the neck. Skeleton: The vertebral body heights and alignment are normal. Mild reversal of the normal cervical lordosis may be positional. No focal osseous lesions are present. Other neck: The soft tissues of the neck are otherwise unremarkable. Salivary glands are within normal limits. Thyroid is normal. No significant adenopathy is present. No focal mucosal or submucosal lesions are present. Upper chest: The lung apices are clear. The thoracic inlet is within normal limits.  Review of the MIP images confirms the above findings CTA HEAD FINDINGS Anterior circulation: Internal  carotid arteries are within normal limits from the skull base to the ICA termini. The A1 and M1 segments are normal. The anterior communicating artery is patent. MCA bifurcations are normal bilaterally. The ACA and MCA branch vessels are normal bilaterally. No aneurysm is present. Posterior circulation: PICA origins are visualized and normal. The vertebrobasilar junction and basilar artery are normal. The superior cerebellar arteries are patent bilaterally. Both posterior cerebral arteries originate from basilar tip. Small posterior communicating arteries are present. The PCA branch vessels are normal bilaterally. No aneurysm is present. Venous sinuses: The dural sinuses are patent. The straight sinus and deep cerebral veins are intact. Cortical veins are within normal limits. No significant vascular malformation is evident. Anatomic variants: None Review of the MIP images confirms the above findings IMPRESSION: 1. Normal CTA of the neck. 2. Normal CTA Circle of Willis without significant proximal stenosis, aneurysm, or branch vessel occlusion. These results were called by telephone at the time of interpretation on 01/07/2024 at 7:00 pm to provider ERIC [facility] , who verbally acknowledged these results. Electronically Signed   By: Marin Roberts M.D.   On: 01/07/2024 19:05   CT HEAD CODE STROKE WO CONTRAST Result Date: 01/07/2024 CLINICAL DATA:  Code stroke. Left-sided paralysis. History of migraines. EXAM: CT HEAD WITHOUT CONTRAST TECHNIQUE: Contiguous axial images were obtained from the base of the skull through the vertex without intravenous contrast. RADIATION DOSE REDUCTION: This exam was performed according to the departmental dose-optimization program which includes automated exposure control, adjustment of the mA and/or kV according to patient size and/or use of iterative reconstruction technique. COMPARISON:  Report of CT head without contrast 04/28/2005. FINDINGS: Brain: Dense calcifications are  present within the globus pallidus bilaterally. No acute infarct, hemorrhage, or mass lesion is present. No significant white matter lesions are present. The deep brain nuclei are otherwise within normal limits. The ventricles are of normal size. No significant extraaxial fluid collection is present. The brainstem and cerebellum are within normal limits. Midline structures are within normal limits. Vascular: No hyperdense vessel or unexpected calcification. Skull: Calvarium is intact. No focal lytic or blastic lesions are present. No significant extracranial soft tissue lesion is present. Sinuses/Orbits: The paranasal sinuses and mastoid air cells are clear. The globes and orbits are within normal limits. ASPECTS [facility] Stroke Program Early CT Score) - Ganglionic level infarction (caudate, lentiform nuclei, internal capsule, insula, M1-M3 cortex): 7/7 - Supraganglionic infarction (M4-M6 cortex): 3/3 Total score (0-10 with 10 being normal): 10/10 IMPRESSION: 1. No acute intracranial abnormality or significant interval change. 2. Aspects is 10/10. 3. Dense calcifications within the globus pallidus bilaterally. This is a nonspecific finding but can be seen in the setting of Fahr's disease. ASPECTS is Electronically Signed   By: Marin Roberts M.D.   On: 01/07/2024 18:48    Vitals:   01/08/24 4782 01/08/24 1000 01/08/24 1018 01/08/24 1336  BP: 128/82 137/81  (!) 144/87  Pulse: 94 91  98  Resp: 20 18  (!) 25  Temp:   98 F (36.7 C)   TempSrc:   Oral   SpO2: 100% 100%  100%  Weight:      Height:         PHYSICAL EXAM General:  Alert, well-nourished, well-developed pleasant young Caucasian lady in no acute distress Psych:  Mood and affect appropriate for situation CV: Regular rate and rhythm on monitor Respiratory:  Regular, unlabored respirations  on room air GI: Abdomen soft and nontender   NEURO:  Mental Status: AA&Ox3, patient is able to give clear and coherent history Speech/Language:  speech is without aphasia. Mild dysarthria   Cranial Nerves:  II: PERRL. Visual fields full.  III, IV, VI: EOMI. Eyelids elevate symmetrically.  V: Sensation is intact to light touch and symmetrical to face.  VII: Left facial droop VIII: hearing intact to voice. IX, X: Palate elevates symmetrically. Phonation is normal.  ZO:[id number] shrug 5/5. XII: tongue is midline without fasciculations. Motor: RUE and RLE 5/5 LUE 0/5 elbow flexion and extension. 0/5 deltoid, wrist flexion/extension, finger abduction/extension and grip LLE 3+ - 4/5 with drift Tone is normal to BLE and RUE, decreased to LUE Bulk is normal  Sensation- Intact to light touch bilaterally. Extinction absent to light touch to DSS.   Coordination: FTN intact on the right, HKS: no ataxia on the righ t Gait- deferred  Most Recent NIH 8     ASSESSMENT/PLAN  Ms. Faith Jordan is a 28 y.o. female with history of migraines admitted for acute onset of left hemiplegia and facial droop.  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 Aspects is 10/10. Dense calcifications within the globus pallidus bilaterally. This is a nonspecific finding but can be seen in the setting of Fahr's disease. CTA head & neck Normal CTA of the neck. Normal CTA Circle of Willis without significant proximal stenosis, aneurysm, or branch vessel occlusion. MRI 10 mm acute nonhemorrhagic infarct of the posterior limb of the right internal capsule. Susceptibility related calcifications in the globus pallidus bilaterally. This likely reflects the sequela of chronic microvascular ischemia. Vas [place] TCD Bubble pending Vas [place] LE pending Will need TEE-request sent Hypercoagulable panel and ANA ordered 2D Echo EF 60-65%.  Left atrial size  normal LDL 106 HgbA1c 4.9 CRP and Sed Rate normal  VTE prophylaxis - Lovenox No antithrombotic prior to admission, loaded with 300mg  Plavix now on clopidogrel 75 mg daily (allergy to NSAIDs will not start aspirin) Therapy recommendations:  CIR Disposition:  Discharge to CIR after medical work up   Hx of hemiplegic migraines  Follows with Dr. Lucia Gaskins at [facility] 1 headache per week; she estimates about 5% of them to be with left facial weakness only in the absence of headache, about 5% with left facial weakness + headache and about 90% of them with headache pain only Discontinue all medications containing estrogen or estrogen analogues   Hyperlipidemia LDL 106, goal < 70 Add atorvastatin Continue statin at discharge  Other Stroke Risk Factors ETOH use, alcohol level <10, advised to drink no more than 2 drink(s) a day Family hx of migraines  Hospital day # 1   Gevena Mart DNP, ACNPC-AG  [facility]  I have personally obtained history,examined this patient, reviewed notes, independently viewed imaging studies, participated in medical decision making and plan of care.ROS completed by me personally and pertinent positives fully documented  I have made any additions or clarifications directly to the above note. Agree with note above.  28 year old Caucasian lady with no significant vascular risk factors except being on estrogen-containing birth control pills and mild hyperlipidemia presented with left-sided weakness and MRI shows 1 cm right internal capsule infarct.  Recommend further evaluation by checking TCD bubble study for PFO  and lower extremity venous Dopplers for DVT, ANA panel and, hypercoagulable labs and TEE.  Continue cardiac monitoring.  Aspirin and Plavix for 3 weeks followed by aspirin alone and aggressive risk factor modifications.  Statin for elevated lipids.  Physical Occupational Therapy consults.  She will likely need inpatient rehab.  Patient advised to discontinue  estrogen containing birth control pills.  Long discussion with the patient and mother at the bedside and answered questions.  Discussed with Dr. Alanda Slim.  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  Delia Heady, MD Medical Director [facility] Stroke Center Pager: [phone] 01/08/2024 4:02 PM    To contact Stroke Continuity provider, please refer to [email or web address]. After hours, contact General Neurology

## 2024-01-08 NOTE — Progress Notes (Signed)
 VASCULAR LAB    TCD bubble study has been performed.  See CV proc for preliminary results.   Amyrie Illingworth, RVT 01/08/2024, 3:09 PM

## 2024-01-08 NOTE — Progress Notes (Signed)
 Orthopedic Tech Progress Note Patient Details:  Faith Jordan 02-07-1996 [id number]  Patient ID: [name], female   DOB: Jun 11, 1996, 29 y.o.   MRN: [id number] Resting hand splint ordered from [facility]. Darleen Crocker 01/08/2024, 12:48 PM

## 2024-01-08 NOTE — Evaluation (Signed)
 Occupational Therapy Evaluation Patient Details Name: Faith Jordan MRN: [id number] DOB: 12/24/1995 Today's Date: 01/08/2024   History of Present Illness   28 y.o. female presents to [facility] 01/07/24 with L sided weakness, Code Stroke called. 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 PMHx: familial hemiplegic migraine, angioedema 2017, dysautonomia     Clinical Impressions PT admitted with R CVA with L side hemiplegia. Pt currently with functional limitiations due to the deficits listed below (see OT problem list). 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 Pt at this time with visual changes and L UE deficits. Pt demonstrates basic transfer min A and recommend RN staff use stedy for transfers. Next session to trial a platform walker for transfer. OT to further assess visual deficits and make more iphone modifications as needed. Pt is able to use siri on iphone to answer calls at this time. Parents present during OT session.  Pt will benefit from skilled OT to increase their independence and safety with adls and balance to allow discharge Patient will benefit from intensive inpatient follow-up therapy, >3 hours/day      If plan is discharge home, recommend the following:   A lot of help with walking and/or transfers;A lot of help with bathing/dressing/bathroom     Functional Status Assessment   Patient has had a recent decline in their functional status and demonstrates the ability to make significant improvements in function in a reasonable and predictable amount of time.     Equipment Recommendations   Other (comment);BSC/3in1 (platform RW)     Recommendations for Other Services   Rehab consult;Speech consult;PT consult (ortho tech for resting hand splint)     Precautions/Restrictions   Precautions Precautions: Fall Recall of Precautions/Restrictions: Intact Precaution/Restrictions Comments:  SBP < 180 Restrictions Weight Bearing Restrictions Per Provider Order: No     Mobility Bed Mobility Overal bed mobility: Needs Assistance Bed Mobility: Supine to Sit, Sit to Supine     Supine to sit: Supervision Sit to supine: Supervision   General bed mobility comments: supervision for safety on stretcher    Transfers Overall transfer level: Needs assistance Equipment used: 1 person hand held assist Transfers: Sit to/from Stand, Bed to chair/wheelchair/BSC Sit to Stand: Min assist     Step pivot transfers: Min assist     General transfer comment: MinA to block L LE due to occasional buckling      Balance Overall balance assessment: Needs assistance, Mild deficits observed, not formally tested Sitting-balance support: Feet supported, Single extremity supported Sitting balance-Leahy Scale: Fair Sitting balance - Comments: pt states its harder to sit up than normal   Standing balance support: Single extremity supported, During functional activity, Reliant on assistive device for balance Standing balance-Leahy Scale: Poor Standing balance comment: requires MinA to stand to block LLE                           ADL either performed or assessed with clinical judgement   ADL Overall ADL's : Needs assistance/impaired Eating/Feeding: Supervision/ safety Eating/Feeding Details (indicate cue type and reason): cues to make sure that she sweeps L side of mouth with tonge. pt taking small appropriate bites and self feeding at end of session. Grooming: Wash/dry hands;Modified independent                   Toilet Transfer: Minimal assistance  General ADL Comments: pt able to take several steps away from bed with OT blocking LLE     Vision Baseline Vision/History: 0 No visual deficits Ability to See in Adequate Light: 1 Impaired Patient Visual Report: Blurring of vision Vision Assessment?: Vision impaired- to be further tested in functional  context Additional Comments: pt reports "I can't see TV its blurry" pt noted with font size 14-18 to be able to read document. pt is able to scan and select the correct letter. Pt able to read phone with modifications to the screen, able to answer phone with hey siri turned on.     Perception         Praxis         Pertinent Vitals/Pain Pain Assessment Pain Assessment: No/denies pain     Extremity/Trunk Assessment Upper Extremity Assessment Upper Extremity Assessment: Right hand dominant;LUE deficits/detail LUE Deficits / Details: pt has shrug and elbow flexion/ extension ( bicep and tricep)  no activation of wrist or digits at this time. pt provided weight bearing to help with neuro recovery. LUE Sensation: decreased light touch;decreased proprioception LUE Coordination: decreased fine motor;decreased gross motor   Lower Extremity Assessment Lower Extremity Assessment: Defer to PT evaluation RLE Deficits / Details: WFL RLE Sensation: WNL LLE Deficits / Details: noted to buckle LLE Sensation: WNL LLE Coordination: decreased gross motor   Cervical / Trunk Assessment Cervical / Trunk Assessment: Normal   Communication Communication Communication: Impaired Factors Affecting Communication: Reduced clarity of speech (soft spoken, increased time and effort to speak)   Cognition Arousal: Alert Behavior During Therapy: WFL for tasks assessed/performed (tearful) Cognition: No apparent impairments             OT - Cognition Comments: pt during functional task appears [facility]. OT to further assess high level of executive function in future session                 Following commands: Intact       Cueing  General Comments   Cueing Techniques: Verbal cues;Tactile cues  VSS on RA   Exercises     Shoulder Instructions      Home Living Family/patient expects to be discharged to:: Private residence Living Arrangements: Alone Available Help at Discharge:  Family;Available 24 hours/day Type of Home: House Home Access: Stairs to enter [employer] of Steps: 3 Entrance Stairs-Rails: Right;Left Home Layout: Multi-level;Able to live on main level with bedroom/bathroom     Bathroom Shower/Tub: Producer, television/film/video: Standard Bathroom Accessibility: Yes   Home Equipment: BSC/3in1;Shower seat   Additional Comments: History for mother's house where she would discharge from the hospital. 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 Had multiple falls leading up to admission ADLs Comments: ind    OT Problem List: Decreased strength;Decreased range of motion;Decreased activity tolerance;Impaired balance (sitting and/or standing);Impaired vision/perception;Decreased coordination;Decreased cognition;Decreased safety awareness;Decreased knowledge of precautions;Cardiopulmonary status limiting activity;Decreased knowledge of use of DME or AE;Impaired sensation;Impaired UE functional use   OT Treatment/Interventions: Self-care/ADL training;Therapeutic exercise;Neuromuscular education;Energy conservation;DME and/or AE instruction;Manual therapy;Modalities;Splinting;Therapeutic activities;Patient/family education;Balance training;Cognitive remediation/compensation;Visual/perceptual remediation/compensation      OT Goals(Current goals can be found in the care plan section)   Acute Rehab OT Goals Patient Stated Goal: to be able to use my arm  again and walk again OT Goal Formulation: With patient/family Time For Goal Achievement: 01/22/24 Potential to Achieve Goals: Good   OT Frequency:  Min 3X/week    Co-evaluation              AM-PAC OT "6 Clicks" Daily Activity     Outcome Measure Help from another person eating meals?: A  Little Help from another person taking care of personal grooming?: A Little Help from another person toileting, which includes using toliet, bedpan, or urinal?: A Lot Help from another person bathing (including washing, rinsing, drying)?: A Lot Help from another person to put on and taking off regular upper body clothing?: A Lot Help from another person to put on and taking off regular lower body clothing?: A Lot 6 Click Score: 14   End of Session Equipment Utilized During Treatment: Gait belt Nurse Communication: Mobility status;Precautions  Activity Tolerance: Patient tolerated treatment well Patient left: in bed;with call bell/phone within reach;with family/visitor present  OT Visit Diagnosis: Unsteadiness on feet (R26.81);Hemiplegia and hemiparesis Hemiplegia - Right/Left: Left Hemiplegia - dominant/non-dominant: Non-Dominant Hemiplegia - caused by: Cerebral infarction                Time: 9528-4132 OT Time Calculation (min): 38 min Charges:  OT General Charges $OT Visit: 1 Visit OT Evaluation $OT Eval Moderate Complexity: 1 Mod OT Treatments $Self Care/Home Management : 8-22 mins   Brynn, OTR/L  Acute Rehabilitation Services Office: [phone] .   Mateo Flow 01/08/2024, 12:18 PM

## 2024-01-08 NOTE — Progress Notes (Signed)
  Echocardiogram 2D Echocardiogram has been performed.  Faith Jordan 01/08/2024, 8:53 AM

## 2024-01-08 NOTE — Progress Notes (Signed)
 VASCULAR LAB    Bilateral lower extremity venous duplex has been performed.  See CV proc for preliminary results.   Ziara Thelander, RVT 01/08/2024, 3:09 PM

## 2024-01-08 NOTE — ED Notes (Signed)
 Provided patient with apple juice. Diet order being obtained , verbal consent from MD. Swallow screen passed and documented.

## 2024-01-08 NOTE — Progress Notes (Addendum)
 STROKE TEAM PROGRESS NOTE   INTERIM HISTORY/SUBJECTIVE  Patient is awake and alert in no apparent distress.  Mom is at the bedside.  Patient states that on Friday she developed left side weakness and facial droop and she thought it was one of her migraines however did not have headache at the time, but took her migraine medications.  Symptoms persisted so she came to be evaluated in the emergency room last evening  MRI 1 cm right internal capsule nonhemorrhagic infarct CTA brain and neck normal CBC    Component Value Date/Time   WBC 10.9 (H) 01/07/2024 2328   RBC 4.60 01/07/2024 2328   HGB 13.4 01/07/2024 2328   HGB 11.8 09/28/2017 1459   HCT 39.4 01/07/2024 2328   HCT 36.7 09/28/2017 1459   PLT 311 01/07/2024 2328   PLT 368 09/28/2017 1459   MCV 85.7 01/07/2024 2328   MCV 88 09/28/2017 1459   MCH 29.1 01/07/2024 2328   MCHC 34.0 01/07/2024 2328   RDW 13.4 01/07/2024 2328   RDW 13.1 09/28/2017 1459   LYMPHSABS 1.8 01/07/2024 1749   LYMPHSABS 2.8 09/28/2017 1459   MONOABS 0.6 01/07/2024 1749   EOSABS 0.0 01/07/2024 1749   EOSABS 0.0 09/28/2017 1459   BASOSABS 0.0 01/07/2024 1749   BASOSABS 0.0 09/28/2017 1459    BMET    Component Value Date/Time   NA 137 01/07/2024 2328   NA 142 09/28/2017 1459   K 3.3 (L) 01/07/2024 2328   CL 106 01/07/2024 2328   CO2 20 (L) 01/07/2024 2328   GLUCOSE 102 (H) 01/07/2024 2328   BUN 7 01/07/2024 2328   BUN 3 (L) 09/28/2017 1459   CREATININE 0.67 01/07/2024 2328   CALCIUM 8.6 (L) 01/07/2024 2328   GFRNONAA >60 01/07/2024 2328    IMAGING past 24 hours ECHOCARDIOGRAM COMPLETE Result Date: 01/08/2024    ECHOCARDIOGRAM REPORT   Patient Name:   [name] Date of Exam: 01/08/2024 Medical Rec #:  [id number]      Height:       67.0 in Accession #:    [id number]     Weight:       170.0 lb Date of Birth:  22-Jan-1996      BSA:          1.887 m Patient Age:    28 years       BP:           132/74 mmHg Patient Gender: F              HR:            87 bpm. Exam Location:  Inpatient Procedure: 2D Echo, Color Doppler and Cardiac Doppler (Both Spectral and Color            Flow Doppler were utilized during procedure). Indications:    Stroke  History:        Patient has prior history of Echocardiogram examinations, most                 recent 02/10/2021.  Sonographer:    Rosaland Lao Referring Phys: [id number] CAROLE N HALL IMPRESSIONS  1. Left ventricular ejection fraction, by estimation, is 60 to 65%. The left ventricle has normal function. The left ventricle has no regional wall motion abnormalities. Left ventricular diastolic parameters were normal.  2. Right ventricular systolic function is normal. The right ventricular size is normal.  3. The mitral valve is normal in structure. Trivial mitral valve regurgitation. No evidence of  mitral stenosis.  4. The aortic valve is normal in structure. Aortic valve regurgitation is not visualized. No aortic stenosis is present.  5. The inferior vena cava is normal in size with greater than 50% respiratory variability, suggesting right atrial pressure of 3 mmHg. Conclusion(s)/Recommendation(s): Normal biventricular function without evidence of hemodynamically significant valvular heart disease. FINDINGS  Left Ventricle: Left ventricular ejection fraction, by estimation, is 60 to 65%. The left ventricle has normal function. The left ventricle has no regional wall motion abnormalities. The left ventricular internal cavity size was normal in size. There is  no left ventricular hypertrophy. Left ventricular diastolic parameters were normal. Right Ventricle: The right ventricular size is normal. No increase in right ventricular wall thickness. Right ventricular systolic function is normal. Left Atrium: Left atrial size was normal in size. Right Atrium: Right atrial size was normal in size. Pericardium: There is no evidence of pericardial effusion. Mitral Valve: The mitral valve is normal in structure. Trivial mitral valve  regurgitation. No evidence of mitral valve stenosis. Tricuspid Valve: The tricuspid valve is normal in structure. Tricuspid valve regurgitation is not demonstrated. No evidence of tricuspid stenosis. Aortic Valve: The aortic valve is normal in structure. Aortic valve regurgitation is not visualized. No aortic stenosis is present. Pulmonic Valve: The pulmonic valve was normal in structure. Pulmonic valve regurgitation is not visualized. No evidence of pulmonic stenosis. Aorta: The aortic root is normal in size and structure. Venous: The inferior vena cava is normal in size with greater than 50% respiratory variability, suggesting right atrial pressure of 3 mmHg. IAS/Shunts: No atrial level shunt detected by color flow Doppler.  LEFT VENTRICLE PLAX 2D LVIDd:         4.20 cm   Diastology LVIDs:         2.60 cm   LV e' medial:    11.90 cm/s LV PW:         0.70 cm   LV E/e' medial:  8.0 LV IVS:        0.80 cm   LV e' lateral:   17.00 cm/s LVOT diam:     1.90 cm   LV E/e' lateral: 5.6 LV SV:         48 LV SV Index:   26 LVOT Area:     2.84 cm  RIGHT VENTRICLE             IVC RV Basal diam:  3.00 cm     IVC diam: 1.10 cm RV S prime:     13.90 cm/s TAPSE (M-mode): 2.0 cm LEFT ATRIUM             Index        RIGHT ATRIUM          Index LA diam:        3.20 cm 1.70 cm/m   RA Area:     9.40 cm LA Vol (A2C):   32.4 ml 17.17 ml/m  RA Volume:   17.60 ml 9.33 ml/m LA Vol (A4C):   31.0 ml 16.43 ml/m LA Biplane Vol: 31.6 ml 16.74 ml/m  AORTIC VALVE LVOT Vmax:   84.60 cm/s LVOT Vmean:  57.700 cm/s LVOT VTI:    0.170 m  AORTA Ao Root diam: 2.20 cm Ao Asc diam:  2.40 cm MITRAL VALVE MV Area (PHT): 5.27 cm    SHUNTS MV Decel Time: 144 msec    Systemic VTI:  0.17 m MV E velocity: 95.00 cm/s  Systemic Diam: 1.90 cm MV A  velocity: 62.20 cm/s MV E/A ratio:  1.53 Donato Schultz MD Electronically signed by Donato Schultz MD Signature Date/Time: 01/08/2024/10:00:37 AM    Final    MR BRAIN W WO CONTRAST Result Date: 01/07/2024 CLINICAL  DATA:  New onset left-sided weakness. EXAM: MRI HEAD WITHOUT AND WITH CONTRAST TECHNIQUE: Multiplanar, multiecho pulse sequences of the brain and surrounding structures were obtained without and with intravenous contrast. CONTRAST:  8mL GADAVIST GADOBUTROL 1 MMOL/ML IV SOLN COMPARISON:  CT head without contrast and CT angio head and neck 01/07/2024. FINDINGS: Brain: A 10 mm acute nonhemorrhagic infarct is present in the posterior limb of the right internal capsule. T2 and FLAIR hyperintensities are consistent with the time frame nearly 24 hours. No acute hemorrhage or mass lesion is present. Susceptibility related calcifications in the globus pallidus noted bilaterally. Basal ganglia are otherwise within normal limits. The ventricles are of normal size. No significant extraaxial fluid collection is present. The brainstem and cerebellum are within normal limits. Midline structures are within normal limits. Vascular: Flow is present in the major intracranial arteries. Skull and upper cervical spine: The craniocervical junction is normal. Upper cervical spine is within normal limits. Marrow signal is unremarkable. Sinuses/Orbits: The paranasal sinuses and mastoid air cells are clear. The globes and orbits are within normal limits. IMPRESSION: 1. 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 2. Susceptibility related calcifications in the globus pallidus bilaterally. This likely reflects the sequela of chronic microvascular ischemia. The above was relayed via text pager to Dr. Wilford Corner on 01/07/2024 at 20:52 . Electronically Signed   By: Marin Roberts M.D.   On: 01/07/2024 20:55   CT ANGIO HEAD NECK W WO CM (CODE STROKE) Result Date: 01/07/2024 CLINICAL DATA:  Left hemiplegia. EXAM: CT ANGIOGRAPHY HEAD AND NECK WITH AND WITHOUT CONTRAST TECHNIQUE: Multidetector CT imaging of the head and neck was performed using the standard protocol during bolus administration of  intravenous contrast. Multiplanar CT image reconstructions and MIPs were obtained to evaluate the vascular anatomy. Carotid stenosis measurements (when applicable) are obtained utilizing NASCET criteria, using the distal internal carotid diameter as the denominator. RADIATION DOSE REDUCTION: This exam was performed according to the departmental dose-optimization program which includes automated exposure control, adjustment of the mA and/or kV according to patient size and/or use of iterative reconstruction technique. CONTRAST:  75mL OMNIPAQUE IOHEXOL 350 MG/ML SOLN COMPARISON:  CT head without contrast 01/07/2024 at 6:41 p.m. FINDINGS: CTA NECK FINDINGS Aortic arch: A 3 vessel arch configuration is present. No significant atherosclerotic changes are present at the aortic arch. Great vessel origins are widely patent. No aneurysm or dissection is present. Right carotid system: The right common carotid artery is within normal limits. Bifurcation is within normal limits. The cervical right ICA is normal. Left carotid system: The left common carotid artery is within normal limits. Bifurcation is unremarkable. Cervical left ICA is normal. Vertebral arteries: The vertebral arteries are codominant. Both vertebral arteries originate from the subclavian arteries without significant stenosis. No significant stenosis is present in either vertebral artery in the neck. Skeleton: The vertebral body heights and alignment are normal. Mild reversal of the normal cervical lordosis may be positional. No focal osseous lesions are present. Other neck: The soft tissues of the neck are otherwise unremarkable. Salivary glands are within normal limits. Thyroid is normal. No significant adenopathy is present. No focal mucosal or submucosal lesions are present. Upper chest: The lung apices are clear. The thoracic inlet is within normal limits.  Review of the MIP images confirms the above findings CTA HEAD FINDINGS Anterior circulation: Internal  carotid arteries are within normal limits from the skull base to the ICA termini. The A1 and M1 segments are normal. The anterior communicating artery is patent. MCA bifurcations are normal bilaterally. The ACA and MCA branch vessels are normal bilaterally. No aneurysm is present. Posterior circulation: PICA origins are visualized and normal. The vertebrobasilar junction and basilar artery are normal. The superior cerebellar arteries are patent bilaterally. Both posterior cerebral arteries originate from basilar tip. Small posterior communicating arteries are present. The PCA branch vessels are normal bilaterally. No aneurysm is present. Venous sinuses: The dural sinuses are patent. The straight sinus and deep cerebral veins are intact. Cortical veins are within normal limits. No significant vascular malformation is evident. Anatomic variants: None Review of the MIP images confirms the above findings IMPRESSION: 1. Normal CTA of the neck. 2. Normal CTA Circle of Willis without significant proximal stenosis, aneurysm, or branch vessel occlusion. These results were called by telephone at the time of interpretation on 01/07/2024 at 7:00 pm to provider ERIC [facility] , who verbally acknowledged these results. Electronically Signed   By: Marin Roberts M.D.   On: 01/07/2024 19:05   CT HEAD CODE STROKE WO CONTRAST Result Date: 01/07/2024 CLINICAL DATA:  Code stroke. Left-sided paralysis. History of migraines. EXAM: CT HEAD WITHOUT CONTRAST TECHNIQUE: Contiguous axial images were obtained from the base of the skull through the vertex without intravenous contrast. RADIATION DOSE REDUCTION: This exam was performed according to the departmental dose-optimization program which includes automated exposure control, adjustment of the mA and/or kV according to patient size and/or use of iterative reconstruction technique. COMPARISON:  Report of CT head without contrast 04/28/2005. FINDINGS: Brain: Dense calcifications are  present within the globus pallidus bilaterally. No acute infarct, hemorrhage, or mass lesion is present. No significant white matter lesions are present. The deep brain nuclei are otherwise within normal limits. The ventricles are of normal size. No significant extraaxial fluid collection is present. The brainstem and cerebellum are within normal limits. Midline structures are within normal limits. Vascular: No hyperdense vessel or unexpected calcification. Skull: Calvarium is intact. No focal lytic or blastic lesions are present. No significant extracranial soft tissue lesion is present. Sinuses/Orbits: The paranasal sinuses and mastoid air cells are clear. The globes and orbits are within normal limits. ASPECTS [facility] Stroke Program Early CT Score) - Ganglionic level infarction (caudate, lentiform nuclei, internal capsule, insula, M1-M3 cortex): 7/7 - Supraganglionic infarction (M4-M6 cortex): 3/3 Total score (0-10 with 10 being normal): 10/10 IMPRESSION: 1. No acute intracranial abnormality or significant interval change. 2. Aspects is 10/10. 3. Dense calcifications within the globus pallidus bilaterally. This is a nonspecific finding but can be seen in the setting of Fahr's disease. ASPECTS is Electronically Signed   By: Marin Roberts M.D.   On: 01/07/2024 18:48    Vitals:   01/08/24 4782 01/08/24 1000 01/08/24 1018 01/08/24 1336  BP: 128/82 137/81  (!) 144/87  Pulse: 94 91  98  Resp: 20 18  (!) 25  Temp:   98 F (36.7 C)   TempSrc:   Oral   SpO2: 100% 100%  100%  Weight:      Height:         PHYSICAL EXAM General:  Alert, well-nourished, well-developed pleasant young Caucasian lady in no acute distress Psych:  Mood and affect appropriate for situation CV: Regular rate and rhythm on monitor Respiratory:  Regular, unlabored respirations  on room air GI: Abdomen soft and nontender   NEURO:  Mental Status: AA&Ox3, patient is able to give clear and coherent history Speech/Language:  speech is without aphasia. Mild dysarthria   Cranial Nerves:  II: PERRL. Visual fields full.  III, IV, VI: EOMI. Eyelids elevate symmetrically.  V: Sensation is intact to light touch and symmetrical to face.  VII: Left facial droop VIII: hearing intact to voice. IX, X: Palate elevates symmetrically. Phonation is normal.  ZO:[id number] shrug 5/5. XII: tongue is midline without fasciculations. Motor: RUE and RLE 5/5 LUE 0/5 elbow flexion and extension. 0/5 deltoid, wrist flexion/extension, finger abduction/extension and grip LLE 3+ - 4/5 with drift Tone is normal to BLE and RUE, decreased to LUE Bulk is normal  Sensation- Intact to light touch bilaterally. Extinction absent to light touch to DSS.   Coordination: FTN intact on the right, HKS: no ataxia on the righ t Gait- deferred  Most Recent NIH 8     ASSESSMENT/PLAN  Ms. CRICKET GOODLIN is a 28 y.o. female with history of migraines admitted for acute onset of left hemiplegia and facial droop.  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 Aspects is 10/10. Dense calcifications within the globus pallidus bilaterally. This is a nonspecific finding but can be seen in the setting of Fahr's disease. CTA head & neck Normal CTA of the neck. Normal CTA Circle of Willis without significant proximal stenosis, aneurysm, or branch vessel occlusion. MRI 10 mm acute nonhemorrhagic infarct of the posterior limb of the right internal capsule. Susceptibility related calcifications in the globus pallidus bilaterally. This likely reflects the sequela of chronic microvascular ischemia. Vas [place] TCD Bubble pending Vas [place] LE pending Will need TEE-request sent Hypercoagulable panel and ANA ordered 2D Echo EF 60-65%.  Left atrial size  normal LDL 106 HgbA1c 4.9 CRP and Sed Rate normal  VTE prophylaxis - Lovenox No antithrombotic prior to admission, loaded with 300mg  Plavix now on clopidogrel 75 mg daily (allergy to NSAIDs will not start aspirin) Therapy recommendations:  CIR Disposition:  Discharge to CIR after medical work up   Hx of hemiplegic migraines  Follows with Dr. Lucia Gaskins at [facility] 1 headache per week; she estimates about 5% of them to be with left facial weakness only in the absence of headache, about 5% with left facial weakness + headache and about 90% of them with headache pain only Discontinue all medications containing estrogen or estrogen analogues   Hyperlipidemia LDL 106, goal < 70 Add atorvastatin Continue statin at discharge  Other Stroke Risk Factors ETOH use, alcohol level <10, advised to drink no more than 2 drink(s) a day Family hx of migraines  Hospital day # 1   Gevena Mart DNP, ACNPC-AG  [facility]  I have personally obtained history,examined this patient, reviewed notes, independently viewed imaging studies, participated in medical decision making and plan of care.ROS completed by me personally and pertinent positives fully documented  I have made any additions or clarifications directly to the above note. Agree with note above.  28 year old Caucasian lady with no significant vascular risk factors except being on estrogen-containing birth control pills and mild hyperlipidemia presented with left-sided weakness and MRI shows 1 cm right internal capsule infarct.  Recommend further evaluation by checking TCD bubble study for PFO  and lower extremity venous Dopplers for DVT, ANA panel and, hypercoagulable labs and TEE.  Continue cardiac monitoring.  Aspirin and Plavix for 3 weeks followed by aspirin alone and aggressive risk factor modifications.  Statin for elevated lipids.  Physical Occupational Therapy consults.  She will likely need inpatient rehab.  Patient advised to discontinue  estrogen containing birth control pills.  Long discussion with the patient and mother at the bedside and answered questions.  Discussed with Dr. Alanda Slim.  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  Delia Heady, MD Medical Director [facility] Stroke Center Pager: [phone] 01/08/2024 4:02 PM    To contact Stroke Continuity provider, please refer to [email or web address]. After hours, contact General Neurology

## 2024-01-08 NOTE — Progress Notes (Signed)
 OT NOTE  RN STAFF  Please check splint every 4 hours during shift ( remove splint ) to assess for: * pain * redness *swelling  If any symptoms above present remove splint for 15 minutes. If symptoms continue - keep the splint removed and notify OT staff [phone] immediately.   Keep the UE elevated at all times on pillows / towels.  Splint should have two splint covers (blue cloth) with a mesh bag for cleaning. The splint cover (blue cloth) should be washed with soapy water and hung out to dry or washed on delicate in home washer. Do not throw away splint cover it is washable. Please have a set location to store the splints in patients room for daily application.    Splints are to be worn for night time only  Examples of schedule: Splints on at bedtime Splints off at waking up    To place the splint on:  1. Place the wrist in position first and secure strap 2. Position each digit and apply strap 3. The thumb and forearm strap should be applied last   The splints should fit as appeared here Strap over the PIP joint of finger Strap over the MCP ( knuckles)  joints of the hand Strap at the thumb Strap at the wrist Strap at the forearm   Faith Jordan, OTR/L  Acute Rehabilitation Services Office: [phone] .

## 2024-01-08 NOTE — Progress Notes (Signed)
 PROGRESS NOTE  Faith Jordan:[id number] DOB: [birth year]   PCP: Doreene Nest, NP  Patient is from: Home.  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    She initially thought her left-sided weakness were related to her hemiplegic migraine.  However, they usually involve her face and not other parts of her body.  She took her migraine medications but her hemiaplasia persisted which prompted her to come to ER.  Patient is on OCP for birth control.  In ED, code stroke activated.  Evaluated by neurology. Last known well at 8:00 PM on 01/06/2024.  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  CT angio head and neck without significant finding.  Noncontrast MRI brain revealed 10 mm acute nonhemorrhagic infarct of the posterior limb of the right internal capsule, susceptibility related to calcification in globus pallidus bilaterally.  Patient was loaded with Plavix, and admitted for further evaluation.    Subjective: Seen and examined earlier this morning.  No major events overnight of this morning.  She is tearful and scared.  She also have anxiety for which she takes Ativan intermittently at home.  Not able to move her left arm.  Reports some improvement in her left leg.  Denies numbness.  Also reports palpitation.   Objective: Vitals:   01/08/24 0417 01/08/24 0600 01/08/24 0952 01/08/24 1018  BP: 128/86 132/74 128/82   Pulse: 88 92 94   Resp: 19 20 20    Temp: 98.3 F (36.8 C)   98 F (36.7 C)   TempSrc: Oral   Oral  SpO2: 100% 100% 100%   Weight:      Height:        Examination:  GENERAL: No apparent distress.  Nontoxic. HEENT: MMM.  Vision and hearing grossly intact.  NECK: Supple.  No apparent JVD.  RESP:  No IWOB.  Fair aeration bilaterally. CVS:  RRR. Heart sounds normal.  ABD/GI/GU: BS+. Abd soft, NTND.  [name]/EXT:  Moves extremities. No apparent deformity. No edema.  SKIN: no apparent skin lesion or wound NEURO: Awake and alert.  Oriented x 4.  Speaks clearly.  Able to flex her fingers but not able to make fist.  Not able to lift her left arm off the bed.  3+/5 in LLE.  5/5 in right arm and leg.  Light sensation intact in all dermatomes.  Patellar reflex 2+ in the right and diminished in the left.  PSYCH: Anxious and tearful.  Consultants:  Neurology  Procedures: None  Microbiology summarized: None  Assessment and plan: Acute right basal ganglia infarct: Presents with left hemiparesis and left facial droop that goes along with MRI finding.  No history of diabetes, hypertension or tobacco use.  History of familial hemiplegic migraine.  Also on OCP for birth control.  CT angio head and neck and TTE without significant finding.  A1c 4.9%.  LDL 106.  CRP and ESR within normal.  Loaded with Plavix 300 mg in ED.  Patient with history of angioedema due to NSAID. -Appreciate input by neurology -Continue Plavix and Lipitor -  Follow  lower extremity venous Doppler, TCD -Discontinue estrogen-containing meds. -Discontinue Nurtec and Emgality -Hypercoagulable panel, lupus panel, vasculitis panel-defer this to neurology -PT/OT/SLP  Abnormal CT head/MRI brain: Nonspecific dense calcification within globus pallidus bilaterally.  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  Patient has no symptoms of Fahr's disease other than anxiety -Defer to neurology  History of familial hemiplegic migraine: Multiple family members including mother, maternal aunt, and  maternal cousin have this diagnosis as well. -Neurology recommended discontinuing Nurtec and Emgality.   History of anxiety/panic attack/sleep disorder/insomnia -Resume home Ativan, Prozac and trazodone   Muscle spasms, acute -Replenish hypokalemia  Hypokalemia -Monitor replenish as appropriate -Check magnesium  Body mass index is 26.63 kg/m.           DVT prophylaxis:  enoxaparin (LOVENOX) injection 40 mg Start: 01/08/24 1000  Code Status: Full code Family Communication: Updated patient sister at bedside Level of care: Telemetry Medical Status is: Inpatient Remains inpatient appropriate because: Acute CVA   Final disposition: To be determined   55 minutes with more than 50% spent in reviewing records, counseling patient/family and coordinating care.   Sch Meds:  Scheduled Meds:  clopidogrel  75 mg Oral Daily   enoxaparin (LOVENOX) injection  40 mg Subcutaneous Daily   FLUoxetine  40 mg Oral Daily   melatonin  5 mg Oral QHS   topiramate  50 mg Oral Daily   Continuous Infusions:  lactated ringers 50 mL/hr at 01/08/24 1015   PRN Meds:.acetaminophen, labetalol, LORazepam, polyethylene glycol, prochlorperazine, topiramate **AND** topiramate, traZODone  Antimicrobials: Anti-infectives (From admission, onward)    None        I have personally reviewed the following labs and images: CBC: Recent Labs  Lab 01/07/24 1749 01/07/24 1847 01/07/24 2328  WBC 12.7*  --  10.9*  NEUTROABS 10.2*  --   --   HGB 14.6 15.0 13.4  HCT 43.5 44.0 39.4  MCV 85.6  --  85.7  PLT 355  --  311   BMP &GFR Recent Labs  Lab 01/07/24 1749 01/07/24 1847 01/07/24 2328  NA 136 138 137  K 3.7 3.7 3.3*  CL 107 108 106  CO2 19*  --  20*  GLUCOSE 97 95 102*  BUN 10 10 7   CREATININE 0.72 0.70 0.67  CALCIUM 9.1  --  8.6*  MG 1.9  --  1.7  PHOS  --   --  3.1   Estimated Creatinine Clearance: 112.1 mL/min (by C-G formula based on SCr of 0.67 mg/dL). Liver &  Pancreas: Recent Labs  Lab 01/07/24 1749  AST 19  ALT 14  ALKPHOS 52  BILITOT 0.7  PROT 6.9  ALBUMIN 3.7   No results for input(s): "LIPASE", "AMYLASE" in the last 168 hours. No results for input(s): "AMMONIA" in the last 168 hours. Diabetic: Recent Labs    01/07/24 2328  HGBA1C 4.9   Recent Labs  Lab 01/08/24 1014  GLUCAP 91   Cardiac Enzymes: No results for input(s): "CKTOTAL", "CKMB", "CKMBINDEX", "TROPONINI" in the last 168 hours. No results for input(s): "PROBNP" in the last 8760 hours. Coagulation Profile: Recent Labs  Lab 01/07/24 1822  INR 1.0   Thyroid Function Tests: No results for input(s): "TSH", "T4TOTAL", "FREET4", "T3FREE", "THYROIDAB" in the last 72 hours. Lipid Profile: Recent Labs    01/07/24 2338  CHOL 151  HDL 30*  LDLCALC 106*  TRIG 73  CHOLHDL 5.0   Anemia Panel: No results for input(s): "VITAMINB12", "  FOLATE", "FERRITIN", "TIBC", "IRON", "RETICCTPCT" in the last 72 hours. Urine analysis:    Component Value Date/Time   COLORURINE YELLOW 01/07/2024 1949   APPEARANCEUR CLEAR 01/07/2024 1949   APPEARANCEUR Clear 09/08/2022 0800   LABSPEC 1.015 01/07/2024 1949   PHURINE 6.0 01/07/2024 1949   GLUCOSEU NEGATIVE 01/07/2024 1949   HGBUR SMALL (A) 01/07/2024 1949   BILIRUBINUR NEGATIVE 01/07/2024 1949   BILIRUBINUR Negative 09/08/2022 0800   KETONESUR 40 (A) 01/07/2024 1949   PROTEINUR NEGATIVE 01/07/2024 1949   UROBILINOGEN 0.2 07/20/2022 1553   UROBILINOGEN 0.2 03/31/2013 2122   NITRITE NEGATIVE 01/07/2024 1949   LEUKOCYTESUR NEGATIVE 01/07/2024 1949   Sepsis Labs: Invalid input(s): "PROCALCITONIN", "LACTICIDVEN"  Microbiology: No results found for this or any previous visit (from the past 240 hours).  Radiology Studies: ECHOCARDIOGRAM COMPLETE Result Date: 01/08/2024    ECHOCARDIOGRAM REPORT   Patient Name:   SARGUN RUMMELL Ahuja Date of Exam: 01/08/2024 Medical Rec #:  [id number]      Height:       67.0 in Accession #:    [id number]      Weight:       170.0 lb Date of Birth:  1995/11/23      BSA:          1.887 m Patient Age:    28 years       BP:           132/74 mmHg Patient Gender: F              HR:           87 bpm. Exam Location:  Inpatient Procedure: 2D Echo, Color Doppler and Cardiac Doppler (Both Spectral and Color            Flow Doppler were utilized during procedure). Indications:    Stroke  History:        Patient has prior history of Echocardiogram examinations, most                 recent 02/10/2021.  Sonographer:    Rosaland Lao Referring Phys: [id number] CAROLE N HALL IMPRESSIONS  1. Left ventricular ejection fraction, by estimation, is 60 to 65%. The left ventricle has normal function. The left ventricle has no regional wall motion abnormalities. Left ventricular diastolic parameters were normal.  2. Right ventricular systolic function is normal. The right ventricular size is normal.  3. The mitral valve is normal in structure. Trivial mitral valve regurgitation. No evidence of mitral stenosis.  4. The aortic valve is normal in structure. Aortic valve regurgitation is not visualized. No aortic stenosis is present.  5. The inferior vena cava is normal in size with greater than 50% respiratory variability, suggesting right atrial pressure of 3 mmHg. Conclusion(s)/Recommendation(s): Normal biventricular function without evidence of hemodynamically significant valvular heart disease. FINDINGS  Left Ventricle: Left ventricular ejection fraction, by estimation, is 60 to 65%. The left ventricle has normal function. The left ventricle has no regional wall motion abnormalities. The left ventricular internal cavity size was normal in size. There is  no left ventricular hypertrophy. Left ventricular diastolic parameters were normal. Right Ventricle: The right ventricular size is normal. No increase in right ventricular wall thickness. Right ventricular systolic function is normal. Left Atrium: Left atrial size was normal in size. Right  Atrium: Right atrial size was normal in size. Pericardium: There is no evidence of pericardial effusion. Mitral Valve: The mitral valve is normal in structure. Trivial mitral valve regurgitation. No evidence of mitral  valve stenosis. Tricuspid Valve: The tricuspid valve is normal in structure. Tricuspid valve regurgitation is not demonstrated. No evidence of tricuspid stenosis. Aortic Valve: The aortic valve is normal in structure. Aortic valve regurgitation is not visualized. No aortic stenosis is present. Pulmonic Valve: The pulmonic valve was normal in structure. Pulmonic valve regurgitation is not visualized. No evidence of pulmonic stenosis. Aorta: The aortic root is normal in size and structure. Venous: The inferior vena cava is normal in size with greater than 50% respiratory variability, suggesting right atrial pressure of 3 mmHg. IAS/Shunts: No atrial level shunt detected by color flow Doppler.  LEFT VENTRICLE PLAX 2D LVIDd:         4.20 cm   Diastology LVIDs:         2.60 cm   LV e' medial:    11.90 cm/s LV PW:         0.70 cm   LV E/e' medial:  8.0 LV IVS:        0.80 cm   LV e' lateral:   17.00 cm/s LVOT diam:     1.90 cm   LV E/e' lateral: 5.6 LV SV:         48 LV SV Index:   26 LVOT Area:     2.84 cm  RIGHT VENTRICLE             IVC RV Basal diam:  3.00 cm     IVC diam: 1.10 cm RV S prime:     13.90 cm/s TAPSE (M-mode): 2.0 cm LEFT ATRIUM             Index        RIGHT ATRIUM          Index LA diam:        3.20 cm 1.70 cm/m   RA Area:     9.40 cm LA Vol (A2C):   32.4 ml 17.17 ml/m  RA Volume:   17.60 ml 9.33 ml/m LA Vol (A4C):   31.0 ml 16.43 ml/m LA Biplane Vol: 31.6 ml 16.74 ml/m  AORTIC VALVE LVOT Vmax:   84.60 cm/s LVOT Vmean:  57.700 cm/s LVOT VTI:    0.170 m  AORTA Ao Root diam: 2.20 cm Ao Asc diam:  2.40 cm MITRAL VALVE MV Area (PHT): 5.27 cm    SHUNTS MV Decel Time: 144 msec    Systemic VTI:  0.17 m MV E velocity: 95.00 cm/s  Systemic Diam: 1.90 cm MV A velocity: 62.20 cm/s MV E/A  ratio:  1.53 Donato Schultz MD Electronically signed by Donato Schultz MD Signature Date/Time: 01/08/2024/10:00:37 AM    Final    MR BRAIN W WO CONTRAST Result Date: 01/07/2024 CLINICAL DATA:  New onset left-sided weakness. EXAM: MRI HEAD WITHOUT AND WITH CONTRAST TECHNIQUE: Multiplanar, multiecho pulse sequences of the brain and surrounding structures were obtained without and with intravenous contrast. CONTRAST:  8mL GADAVIST GADOBUTROL 1 MMOL/ML IV SOLN COMPARISON:  CT head without contrast and CT angio head and neck 01/07/2024. FINDINGS: Brain: A 10 mm acute nonhemorrhagic infarct is present in the posterior limb of the right internal capsule. T2 and FLAIR hyperintensities are consistent with the time frame nearly 24 hours. No acute hemorrhage or mass lesion is present. Susceptibility related calcifications in the globus pallidus noted bilaterally. Basal ganglia are otherwise within normal limits. The ventricles are of normal size. No significant extraaxial fluid collection is present. The brainstem and cerebellum are within normal limits. Midline structures are within normal limits.  Vascular: Flow is present in the major intracranial arteries. Skull and upper cervical spine: The craniocervical junction is normal. Upper cervical spine is within normal limits. Marrow signal is unremarkable. Sinuses/Orbits: The paranasal sinuses and mastoid air cells are clear. The globes and orbits are within normal limits. IMPRESSION: 1. 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 2. Susceptibility related calcifications in the globus pallidus bilaterally. This likely reflects the sequela of chronic microvascular ischemia. The above was relayed via text pager to Dr. Wilford Corner on 01/07/2024 at 20:52 . Electronically Signed   By: Marin Roberts M.D.   On: 01/07/2024 20:55   CT ANGIO HEAD NECK W WO CM (CODE STROKE) Result Date: 01/07/2024 CLINICAL DATA:  Left hemiplegia. EXAM: CT  ANGIOGRAPHY HEAD AND NECK WITH AND WITHOUT CONTRAST TECHNIQUE: Multidetector CT imaging of the head and neck was performed using the standard protocol during bolus administration of intravenous contrast. Multiplanar CT image reconstructions and MIPs were obtained to evaluate the vascular anatomy. Carotid stenosis measurements (when applicable) are obtained utilizing NASCET criteria, using the distal internal carotid diameter as the denominator. RADIATION DOSE REDUCTION: This exam was performed according to the departmental dose-optimization program which includes automated exposure control, adjustment of the mA and/or kV according to patient size and/or use of iterative reconstruction technique. CONTRAST:  75mL OMNIPAQUE IOHEXOL 350 MG/ML SOLN COMPARISON:  CT head without contrast 01/07/2024 at 6:41 p.m. FINDINGS: CTA NECK FINDINGS Aortic arch: A 3 vessel arch configuration is present. No significant atherosclerotic changes are present at the aortic arch. Great vessel origins are widely patent. No aneurysm or dissection is present. Right carotid system: The right common carotid artery is within normal limits. Bifurcation is within normal limits. The cervical right ICA is normal. Left carotid system: The left common carotid artery is within normal limits. Bifurcation is unremarkable. Cervical left ICA is normal. Vertebral arteries: The vertebral arteries are codominant. Both vertebral arteries originate from the subclavian arteries without significant stenosis. No significant stenosis is present in either vertebral artery in the neck. Skeleton: The vertebral body heights and alignment are normal. Mild reversal of the normal cervical lordosis may be positional. No focal osseous lesions are present. Other neck: The soft tissues of the neck are otherwise unremarkable. Salivary glands are within normal limits. Thyroid is normal. No significant adenopathy is present. No focal mucosal or submucosal lesions are present.  Upper chest: The lung apices are clear. The thoracic inlet is within normal limits. Review of the MIP images confirms the above findings CTA HEAD FINDINGS Anterior circulation: Internal carotid arteries are within normal limits from the skull base to the ICA termini. The A1 and M1 segments are normal. The anterior communicating artery is patent. MCA bifurcations are normal bilaterally. The ACA and MCA branch vessels are normal bilaterally. No aneurysm is present. Posterior circulation: PICA origins are visualized and normal. The vertebrobasilar junction and basilar artery are normal. The superior cerebellar arteries are patent bilaterally. Both posterior cerebral arteries originate from basilar tip. Small posterior communicating arteries are present. The PCA branch vessels are normal bilaterally. No aneurysm is present. Venous sinuses: The dural sinuses are patent. The straight sinus and deep cerebral veins are intact. Cortical veins are within normal limits. No significant vascular malformation is evident. Anatomic variants: None Review of the MIP images confirms the above findings IMPRESSION: 1. Normal CTA of the neck. 2. Normal CTA Circle of Willis without significant proximal stenosis, aneurysm, or branch vessel occlusion. These results were  called by telephone at the time of interpretation on 01/07/2024 at 7:00 pm to provider [facility] , who verbally acknowledged these results. Electronically Signed   By: Marin Roberts M.D.   On: 01/07/2024 19:05   CT HEAD CODE STROKE WO CONTRAST Result Date: 01/07/2024 CLINICAL DATA:  Code stroke. Left-sided paralysis. History of migraines. EXAM: CT HEAD WITHOUT CONTRAST TECHNIQUE: Contiguous axial images were obtained from the base of the skull through the vertex without intravenous contrast. RADIATION DOSE REDUCTION: This exam was performed according to the departmental dose-optimization program which includes automated exposure control, adjustment of the mA  and/or kV according to patient size and/or use of iterative reconstruction technique. COMPARISON:  Report of CT head without contrast 04/28/2005. FINDINGS: Brain: Dense calcifications are present within the globus pallidus bilaterally. No acute infarct, hemorrhage, or mass lesion is present. No significant white matter lesions are present. The deep brain nuclei are otherwise within normal limits. The ventricles are of normal size. No significant extraaxial fluid collection is present. The brainstem and cerebellum are within normal limits. Midline structures are within normal limits. Vascular: No hyperdense vessel or unexpected calcification. Skull: Calvarium is intact. No focal lytic or blastic lesions are present. No significant extracranial soft tissue lesion is present. Sinuses/Orbits: The paranasal sinuses and mastoid air cells are clear. The globes and orbits are within normal limits. ASPECTS [facility] Stroke Program Early CT Score) - Ganglionic level infarction (caudate, lentiform nuclei, internal capsule, insula, M1-M3 cortex): 7/7 - Supraganglionic infarction (M4-M6 cortex): 3/3 Total score (0-10 with 10 being normal): 10/10 IMPRESSION: 1. No acute intracranial abnormality or significant interval change. 2. Aspects is 10/10. 3. Dense calcifications within the globus pallidus bilaterally. This is a nonspecific finding but can be seen in the setting of Fahr's disease. ASPECTS is Electronically Signed   By: Marin Roberts M.D.   On: 01/07/2024 18:48      Sherly Brodbeck T. Hollister Wessler [facility]  If 7PM-7AM, please contact night-coverage [email or web address] 01/08/2024, 12:28 PM

## 2024-01-09 ENCOUNTER — Telehealth: Payer: Self-pay | Admitting: Family Medicine

## 2024-01-09 ENCOUNTER — Other Ambulatory Visit (HOSPITAL_COMMUNITY)

## 2024-01-09 DIAGNOSIS — E785 Hyperlipidemia, unspecified: Secondary | ICD-10-CM | POA: Diagnosis not present

## 2024-01-09 DIAGNOSIS — F1090 Alcohol use, unspecified, uncomplicated: Secondary | ICD-10-CM | POA: Diagnosis not present

## 2024-01-09 DIAGNOSIS — R29708 NIHSS score 8: Secondary | ICD-10-CM | POA: Diagnosis not present

## 2024-01-09 DIAGNOSIS — I639 Cerebral infarction, unspecified: Secondary | ICD-10-CM | POA: Diagnosis not present

## 2024-01-09 DIAGNOSIS — I6389 Other cerebral infarction: Secondary | ICD-10-CM | POA: Diagnosis not present

## 2024-01-09 LAB — RENAL FUNCTION PANEL
Albumin: 3.5 g/dL (ref 3.5–5.0)
Anion gap: 9 (ref 5–15)
BUN: 5 mg/dL — ABNORMAL LOW (ref 6–20)
CO2: 20 mmol/L — ABNORMAL LOW (ref 22–32)
Calcium: 8.7 mg/dL — ABNORMAL LOW (ref 8.9–10.3)
Chloride: 108 mmol/L (ref 98–111)
Creatinine, Ser: 0.74 mg/dL (ref 0.44–1.00)
GFR, Estimated: >60 mL/min (ref >60)
Glucose, Bld: 104 mg/dL — ABNORMAL HIGH (ref 70–99)
Phosphorus: 3.4 mg/dL (ref 2.5–4.6)
Potassium: 3.7 mmol/L (ref 3.5–5.1)
Sodium: 137 mmol/L (ref 135–145)

## 2024-01-09 LAB — IRON AND TIBC
Iron: 75 ug/dL (ref 28–170)
Saturation Ratios: 13 % (ref 10.4–31.8)
TIBC: 566 ug/dL — ABNORMAL HIGH (ref 250–450)
UIBC: 491 ug/dL

## 2024-01-09 LAB — LUPUS ANTICOAGULANT PANEL
DRVVT: 35 s (ref 0.0–47.0)
PTT Lupus Anticoagulant: 39.4 s (ref 0.0–43.5)

## 2024-01-09 LAB — BETA-2-GLYCOPROTEIN I ABS, IGG/M/A
Beta-2 Glyco I IgG: <9 GPI IgG units (ref 0–20)
Beta-2-Glycoprotein I IgA: <9 GPI IgA units (ref 0–25)
Beta-2-Glycoprotein I IgM: <9 GPI IgM units (ref 0–32)

## 2024-01-09 LAB — CBC
HCT: 41.7 % (ref 36.0–46.0)
Hemoglobin: 14 g/dL (ref 12.0–15.0)
MCH: 28.8 pg (ref 26.0–34.0)
MCHC: 33.6 g/dL (ref 30.0–36.0)
MCV: 85.8 fL (ref 80.0–100.0)
Platelets: 307 10*3/uL (ref 150–400)
RBC: 4.86 MIL/uL (ref 3.87–5.11)
RDW: 13.4 % (ref 11.5–15.5)
WBC: 7.7 10*3/uL (ref 4.0–10.5)
nRBC: 0 % (ref 0.0–0.2)

## 2024-01-09 LAB — PROTEIN C ACTIVITY: Protein C Activity: 125 % (ref 73–180)

## 2024-01-09 LAB — PROTEIN S, TOTAL: Protein S Ag, Total: 60 % (ref 60–150)

## 2024-01-09 LAB — PROTEIN S ACTIVITY: Protein S Activity: 94 % (ref 63–140)

## 2024-01-09 LAB — ANA W/REFLEX IF POSITIVE: Anti Nuclear Antibody (ANA): NEGATIVE

## 2024-01-09 LAB — FERRITIN: Ferritin: 19 ng/mL (ref 11–307)

## 2024-01-09 LAB — MAGNESIUM: Magnesium: 2 mg/dL (ref 1.7–2.4)

## 2024-01-09 MED ORDER — SODIUM CHLORIDE 0.9% FLUSH: 3.0000 mL | INTRAVENOUS | Status: DC | PRN

## 2024-01-09 MED ORDER — SODIUM CHLORIDE 0.9% FLUSH: 3.0000 mL | Freq: Two times a day (BID) | INTRAVENOUS | Status: DC

## 2024-01-09 NOTE — Interval H&P Note (Signed)
 History and Physical Interval Note:  01/09/2024 9:22 AM  Faith Jordan  has presented today for surgery, with the diagnosis of CVA.  The various methods of treatment have been discussed with the patient and family. After consideration of risks, benefits and other options for treatment, the patient has consented to  Procedure(s): TRANSESOPHAGEAL ECHOCARDIOGRAM (N/A) as a surgical intervention.  The patient's history has been reviewed, patient examined, no change in status, stable for surgery.  I have reviewed the patient's chart and labs.  Questions were answered to the patient's satisfaction.     Breeley Bischof

## 2024-01-09 NOTE — Evaluation (Addendum)
 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   Excessive daytime sleepiness 05/26/2022   Sleep disturbance 03/17/2022   Panic attacks 02/11/2022   Vitamin B12 deficiency 07/30/2021   Vitamin D deficiency 07/30/2021   Family history of hypothyroidism 07/30/2021   GAD (generalized anxiety disorder) 03/18/2020   Fatigue 11/06/2019   Chronic back pain 07/06/2019   Preventative health care 04/27/2018   Migraines 04/25/2018   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   Anemia 01/04/2018   Headache disorder 12/29/2015   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   Sinus tachycardia 12/22/2015   Dysautonomia (HCC) 12/22/2015   Dysautonomia, familial (HCC) 04/22/2015   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   Neurocardiogenic syncope 04/06/2013   Intermittent palpitations 04/06/2013   Past Medical History:  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   Acute flank pain 06/02/2022   Angio-edema 04/27/2016   Angioedema    Aquagenic angio-edema-urticaria    Asthma    no problems /not used recently   Dysautonomia (HCC)    Dysrhythmia    sinus tachycardia   Fibroid    right breast, adenoma   Headache(784.0)    History of COVID-19 11/21/2020   Hives 12/22/2015   Pneumonia    hx  6th grade   Sensation of fullness in both ears 02/02/2023   Snoring 02/11/2022   Urinary frequency 02/12/2019   Vaginal yeast infection 06/02/2022   Vasculitis (HCC)    Past Surgical History:  Past Surgical History:  Procedure Laterality Date    ADENOIDECTOMY     BREAST LUMPECTOMY Right    BREAST LUMPECTOMY Right    MASS EXCISION Right 10/07/2014   Procedure: EXCISION OF RIGHT BREAST MASS;  Surgeon: Avel Peace, MD;  Location: [facility] OR;  Service: General;  Laterality: Right;   TONSILLECTOMY AND ADENOIDECTOMY     TOOTH EXTRACTION     HPI:  28 y.o. female [facility] to [facility]07[facility]24 with L sided weakness, Code Stroke called. 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 PMHx: familial hemiplegic migraine, angioedema 2017, dysautonomia   Assessment / Plan / Recommendation Clinical Impression  Pt who is an ER nurse at [employer] assessed with mother at bedside. Brianna demonstrates CN VII impairment with decreased ROM on left and CN XII with imprecise lingual movement and deviation to the left. Her speech is moderately dysarthric marked by imprecise articulation and distortion affecting speech clarity but not intelligibility. She exhibits good awareness of dysarthria and left sided weakness and was tearful at times throughout session re: situation and with inaccurate response during assessment. She exhibits min-mild impairments on the SLUMS scoring a 26/30. Points missed for mental calcuation and accurately placing hands on clock for desired time. SLP briefly introduced speech strategies. Pt in agreement for continued ST for dysarthria and higher level cognition given that she works as an [employer] fundraiser and was independent prior to admission on acute and recommend rehab >3 hours therapys. Pt and mother in agreement.    SLP Assessment  SLP Recommendation/Assessment: Patient needs continued Speech Lanaguage Pathology Services SLP Visit Diagnosis: Cognitive  communication deficit (R41.841);Dysarthria and anarthria (R47.1)    Recommendations for follow up therapy are one component of a multi-disciplinary discharge planning process, led by the attending physician.  Recommendations may be updated based on patient  status, additional functional criteria and insurance authorization.    Follow Up Recommendations  Acute inpatient rehab (3hours/day)    Assistance Recommended at Discharge  Set up Supervision/Assistance  Functional Status Assessment Patient has had a recent decline in their functional status and demonstrates the ability to make significant improvements in function in a reasonable and predictable amount of time.  Frequency and Duration min 2x/week  2 weeks      SLP Evaluation Cognition  Overall Cognitive Status: Impaired/Different from baseline (mild higher level) Arousal/Alertness: Awake/alert Orientation Level: Oriented X4 Year: 2025 Day of Week: Correct Attention: Sustained Sustained Attention: Appears intact Memory: Appears intact Awareness: Appears intact Problem Solving: Impaired (clock drawing) Problem Solving Impairment: Functional basic Behaviors: Other (comment) (tearful) Safety/Judgment: Appears intact       Comprehension  Auditory Comprehension Overall Auditory Comprehension: Appears within functional limits for tasks assessed Visual Recognition/Discrimination Discrimination: Not tested Reading Comprehension Reading Status: Not tested    Expression Expression Primary Mode of Expression: Verbal Verbal Expression Overall Verbal Expression: Appears within functional limits for tasks assessed Initiation: No impairment Level of Generative/Spontaneous Verbalization: Conversation Repetition:  (NT) Naming: No impairment Pragmatics: No impairment Written Expression Dominant Hand: Right Written Expression: Not tested   Oral / Motor  Oral Motor/Sensory Function Overall Oral Motor/Sensory Function: Mild impairment Facial ROM: Reduced left;Suspected CN VII (facial) dysfunction Facial Symmetry: Abnormal symmetry left;Suspected CN VII (facial) dysfunction Facial Sensation: Other (Comment) (pt denies decreased sensation) Lingual ROM: Reduced left;Suspected CN XII  (hypoglossal) dysfunction Lingual Symmetry: Abnormal symmetry left;Suspected CN XII (hypoglossal) dysfunction Motor Speech Overall Motor Speech: Impaired Respiration: Within functional limits Phonation: Normal Resonance: Within functional limits Articulation: Impaired Level of Impairment: Phrase Intelligibility: Intelligible Motor Planning: Witnin functional limits Motor Speech Errors: Not applicable            Royce Macadamia 01/09/2024, 1:38 PM

## 2024-01-09 NOTE — Progress Notes (Addendum)
 STROKE TEAM PROGRESS NOTE   INTERIM HISTORY/SUBJECTIVE Her mother is in the room.  Patient states she is noted improvement in her left-sided strength.  She can now bend her fingers and move the left arm a little bit of the bed.  Left lower extremity strength also is improved.  TCD bubble study done yesterday was negative for right-to-left shunt and lower extremity venous Dopplers were negative for DVT.  TEE is pending 2D echo shows normal ejection fraction and left atrial size.  Hypercoagulable labs are pending.  ANA is negative Antithrombin III is negative   CBC    Component Value Date/Time   WBC 7.7 01/09/2024 0739   RBC 4.86 01/09/2024 0739   HGB 14.0 01/09/2024 0739   HGB 11.8 09/28/2017 1459   HCT 41.7 01/09/2024 0739   HCT 36.7 09/28/2017 1459   PLT 307 01/09/2024 0739   PLT 368 09/28/2017 1459   MCV 85.8 01/09/2024 0739   MCV 88 09/28/2017 1459   MCH 28.8 01/09/2024 0739   MCHC 33.6 01/09/2024 0739   RDW 13.4 01/09/2024 0739   RDW 13.1 09/28/2017 1459   LYMPHSABS 1.8 01/07/2024 1749   LYMPHSABS 2.8 09/28/2017 1459   MONOABS 0.6 01/07/2024 1749   EOSABS 0.0 01/07/2024 1749   EOSABS 0.0 09/28/2017 1459   BASOSABS 0.0 01/07/2024 1749   BASOSABS 0.0 09/28/2017 1459    BMET    Component Value Date/Time   NA 137 01/09/2024 0739   NA 142 09/28/2017 1459   K 3.7 01/09/2024 0739   CL 108 01/09/2024 0739   CO2 20 (L) 01/09/2024 0739   GLUCOSE 104 (H) 01/09/2024 0739   BUN 5 (L) 01/09/2024 0739   BUN 3 (L) 09/28/2017 1459   CREATININE 0.74 01/09/2024 0739   CALCIUM 8.7 (L) 01/09/2024 0739   GFRNONAA >60 01/09/2024 0739    IMAGING past 24 hours VAS [place] TRANSCRANIAL DOPPLER W BUBBLES Result Date: 01/09/2024  Transcranial Doppler with Bubble Patient Name:  Faith Jordan  Date of Exam:   01/08/2024 Medical Rec #: [id number]       Accession #:    [id number] Date of Birth: 1996/03/19       Patient Gender: F Patient Age:   28 years Exam Location:  [facility] Procedure:       VAS [place] TRANSCRANIAL DOPPLER W BUBBLES Referring Phys: Gevena Mart --------------------------------------------------------------------------------  Indications: Stroke. History: Left side weakness, bilateral tingling of arms, aphasia. History of hemiplegic migraines. Limitations for diagnostic windows: Unable to insonate right transtemporal window. Comparison Study: No prior study Performing Technologist: Sherren Kerns RVS  Examination Guidelines: A complete evaluation includes B-mode imaging, spectral Doppler, color Doppler, and power Doppler as needed of all accessible portions of each vessel. Bilateral testing is considered an integral part of a complete examination. Limited examinations for reoccurring indications may be performed as noted.  Summary: No HITS at rest or during Valsalva. Negative transcranial Doppler Bubble study with no evidence of right to left intracardiac communication.  A vascular evaluation was performed. The right middle cerebral artery was studied. An IV was inserted into the patient's left Forearm. Verbal informed consent was obtained.  Negative TCD Bubble study *See table(s) above for TCD measurements and observations.  Diagnosing physician: Delia Heady MD Electronically signed by Delia Heady MD on 01/09/2024 at 1:34:41 PM.    Final    VAS [place] LOWER EXTREMITY VENOUS (DVT) Result Date: 01/08/2024  Lower Venous DVT Study Patient Name:  Faith Jordan  Date of Exam:  01/08/2024 Medical Rec #: [id number]       Accession #:    [id number] Date of Birth: 1996-09-14       Patient Gender: F Patient Age:   27 years Exam Location:  [facility] Procedure:      VAS [place] LOWER EXTREMITY VENOUS (DVT) Referring Phys: Angelique Blonder WOLFE --------------------------------------------------------------------------------  Indications: Stroke.  Limitations: Depth of vessels. Comparison Study: No prior study on file Performing Technologist: Sherren Kerns RVS  Examination Guidelines: A complete evaluation  includes B-mode imaging, spectral Doppler, color Doppler, and power Doppler as needed of all accessible portions of each vessel. Bilateral testing is considered an integral part of a complete examination. Limited examinations for reoccurring indications may be performed as noted. The reflux portion of the exam is performed with the patient in reverse Trendelenburg.  +---------+---------------+---------+-----------+----------+--------------+ RIGHT    CompressibilityPhasicitySpontaneityPropertiesThrombus Aging +---------+---------------+---------+-----------+----------+--------------+ CFV      Full           Yes      Yes                                 +---------+---------------+---------+-----------+----------+--------------+ SFJ      Full                                                        +---------+---------------+---------+-----------+----------+--------------+ FV Prox  Full                                                        +---------+---------------+---------+-----------+----------+--------------+ FV Mid   Full                                                        +---------+---------------+---------+-----------+----------+--------------+ FV DistalFull           Yes      Yes                                 +---------+---------------+---------+-----------+----------+--------------+ PFV      Full                                                        +---------+---------------+---------+-----------+----------+--------------+ POP      Full           Yes      Yes                                 +---------+---------------+---------+-----------+----------+--------------+ PTV      Full                                                        +---------+---------------+---------+-----------+----------+--------------+  PERO     Full                                                         +---------+---------------+---------+-----------+----------+--------------+ Gastroc  Full                                                        +---------+---------------+---------+-----------+----------+--------------+   +---------+---------------+---------+-----------+----------+-------------------+ LEFT     CompressibilityPhasicitySpontaneityPropertiesThrombus Aging      +---------+---------------+---------+-----------+----------+-------------------+ CFV      Full           Yes      Yes                                      +---------+---------------+---------+-----------+----------+-------------------+ SFJ      Full                                                             +---------+---------------+---------+-----------+----------+-------------------+ FV Prox  Full                                                             +---------+---------------+---------+-----------+----------+-------------------+ FV Mid   Full                                                             +---------+---------------+---------+-----------+----------+-------------------+ FV DistalFull           Yes      Yes                                      +---------+---------------+---------+-----------+----------+-------------------+ PFV      Full                                                             +---------+---------------+---------+-----------+----------+-------------------+ POP                     Yes      Yes                  patent by color and  Doppler             +---------+---------------+---------+-----------+----------+-------------------+ PTV      Full                                                             +---------+---------------+---------+-----------+----------+-------------------+ PERO     Full                                                              +---------+---------------+---------+-----------+----------+-------------------+     Summary: BILATERAL: - No evidence of deep vein thrombosis seen in the lower extremities, bilaterally. -No evidence of popliteal cyst, bilaterally.   *See table(s) above for measurements and observations. Electronically signed by Lemar Livings MD on 01/08/2024 at 5:39:09 PM.    Final     Vitals:   01/09/24 0015 01/09/24 0516 01/09/24 0744 01/09/24 1157  BP: 120/72 112/68 127/81 126/68  Pulse: 71 76 81 78  Resp: 18  18 18   Temp: 100.2 F (37.9 C) 99.8 F (37.7 C) 97.8 F (36.6 C) 98.2 F (36.8 C)  TempSrc: Oral Axillary Oral Oral  SpO2: 98% 100% 99% 99%  Weight:      Height:         PHYSICAL EXAM General:  Alert, well-nourished, well-developed pleasant young Caucasian lady in no acute distress Psych:  Mood and affect appropriate for situation CV: Regular rate and rhythm on monitor Respiratory:  Regular, unlabored respirations on room air GI: Abdomen soft and nontender   NEURO:  Mental Status: AA&Ox3, patient is able to give clear and coherent history Speech/Language: speech is without aphasia. Mild dysarthria   Cranial Nerves:  II: PERRL. Visual fields full.  III, IV, VI: EOMI. Eyelids elevate symmetrically.  V: Sensation is intact to light touch and symmetrical to face.  VII: Left facial droop VIII: hearing intact to voice. IX, X: Palate elevates symmetrically. Phonation is normal.  ZO:[id number] shrug 5/5. XII: tongue is midline without fasciculations. Motor: RUE and RLE 5/5 LUE 2/5 elbow flexion and extension. 1/5 deltoid, wrist flexion/extension, finger abduction/extension and grip LLE   4/5 with drift Tone is normal to BLE and RUE, decreased to LUE Bulk is normal  Sensation- Intact to light touch bilaterally. Extinction absent to light touch to DSS.   Coordination: FTN intact on the right, HKS: no ataxia on the righ t Gait- deferred  Most Recent NIH 5     ASSESSMENT/PLAN  Ms.  Faith Jordan is a 28 y.o. female with history of migraines admitted for acute onset of left hemiplegia and facial droop.  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 Aspects is 10/10. Dense calcifications within the globus pallidus bilaterally. This is a nonspecific finding but can be seen in the setting of Fahr's disease. CTA head & neck  Normal CTA of the neck. Normal CTA Circle of Willis without significant proximal stenosis, aneurysm, or branch vessel occlusion. MRI 10 mm acute nonhemorrhagic infarct of the posterior limb of the right internal capsule. Susceptibility related calcifications in the globus pallidus bilaterally. This likely reflects the sequela of chronic microvascular ischemia. Vas [place] TCD Bubble negative for right-to-left shunt Vas [place] LE negative for DVT Will need TEE-request sent Hypercoagulable panel and ANA ordered 2D Echo EF 60-65%.  Left atrial size normal LDL 106 HgbA1c 4.9 CRP and Sed Rate normal  VTE prophylaxis - Lovenox No antithrombotic prior to admission, loaded with 300mg  Plavix now on clopidogrel 75 mg daily (allergy to NSAIDs will not start aspirin) Therapy recommendations:  CIR Disposition:  Discharge to CIR after medical work up   Hx of hemiplegic migraines  Follows with Dr. Lucia Gaskins at [facility] 1 headache per week; she estimates about 5% of them to be with left facial weakness only in the absence of headache, about 5% with left facial weakness + headache and about 90% of them with headache pain only Discontinue all medications containing estrogen or estrogen  analogues   Hyperlipidemia LDL 106, goal < 70 Add atorvastatin Continue statin at discharge  Other Stroke Risk Factors ETOH use, alcohol level <10, advised to drink no more than 2 drink(s) a day Family hx of migraines  Hospital day # 9     28 year old Caucasian lady with no significant vascular risk factors except being on estrogen-containing birth control pills and mild hyperlipidemia , family history of hemiplegic migraine presented with left-sided weakness and MRI shows 1 cm right internal capsule infarct.  Recommend further evaluation by   hypercoagulable labs and TEE.  Continue cardiac monitoring.  Aspirin and Plavix for 3 weeks followed by aspirin alone and aggressive risk factor modifications.  Statin for elevated lipids.  Physical Occupational Therapy consults.  She will likely need inpatient rehab.  Patient advised to discontinue estrogen containing birth control pills. She also has interesting finding of bilateral basal ganglia calcification.  Will check iron studies and parathyroid hormone levels.  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  But relationship with stroke risk is unclear.   Long discussion with the patient and mother at the bedside and answered questions.  Discussed with Dr. Alanda Slim.  Greater than 50% time during this 35 minute visit was spent in counseling and coordination of care about her cryptogenic stroke and discussion about stroke evaluation and prevention and treatment and answering questions.  Delia Heady, MD Medical Director [facility] Stroke [facility] Pager: [phone] 01/09/2024 2:18 PM    To contact Stroke Continuity provider, please refer to [email or web address]. After hours, contact General Neurology

## 2024-01-09 NOTE — Progress Notes (Signed)
 Inpatient Rehab Admissions Coordinator:     I met with pt. And mother at bedside to discuss CIR admit. They are interested and Pt.'s mother and other family can provide 24/7 support at d/c. I will send case to insurance and pursue for admit.   Megan Salon, MS, CCC-SLP Rehab Admissions Coordinator  [phone] (celll) [phone] (office)

## 2024-01-09 NOTE — TOC CM/SW Note (Signed)
 Transition of Care [facility]) - Inpatient Brief Assessment   Patient Details  Name: Faith Jordan MRN: [id number] Date of Birth: 08/06/1996  [facility] CM/SW Contact:    Kermit Balo, RN Phone Number: 01/09/2024, 2:07 PM   Clinical Narrative:  CIR has sent insurance for inpt rehab admission. TOC following.  Transition of Care Asessment: Insurance and Status: Insurance coverage has been reviewed Patient has primary care physician: Yes Home environment has been reviewed: home alone   Prior/Current Home Services: No current home services Social Drivers of Health Review: SDOH reviewed no interventions necessary Readmission risk has been reviewed: Yes Transition of care needs: transition of care needs identified, TOC will continue to follow

## 2024-01-09 NOTE — Progress Notes (Signed)
   [facility] has been requested to perform a transesophageal echocardiogram on STARSHA MORNING for stroke.    The patient does NOT have any absolute or relative contraindications to a Transesophageal Echocardiogram (TEE).  The patient has: No other conditions that may impact this procedure.    After careful review of history and examination, the risks and benefits of transesophageal echocardiogram have been explained including risks of esophageal damage, perforation (1:10,000 risk), bleeding, pharyngeal hematoma as well as other potential complications associated with conscious sedation including aspiration, arrhythmia, respiratory failure and death. Alternatives to treatment were discussed, questions were answered. Patient is willing to proceed.   Signed, Olena Leatherwood, PA-C  01/09/2024 1:46 PM

## 2024-01-09 NOTE — H&P (View-Only) (Signed)
 STROKE TEAM PROGRESS NOTE   INTERIM HISTORY/SUBJECTIVE Her mother is in the room.  Patient states she is noted improvement in her left-sided strength.  She can now bend her fingers and move the left arm a little bit of the bed.  Left lower extremity strength also is improved.  TCD bubble study done yesterday was negative for right-to-left shunt and lower extremity venous Dopplers were negative for DVT.  TEE is pending 2D echo shows normal ejection fraction and left atrial size.  Hypercoagulable labs are pending.  ANA is negative Antithrombin III is negative   CBC    Component Value Date/Time   WBC 7.7 01/09/2024 0739   RBC 4.86 01/09/2024 0739   HGB 14.0 01/09/2024 0739   HGB 11.8 09/28/2017 1459   HCT 41.7 01/09/2024 0739   HCT 36.7 09/28/2017 1459   PLT 307 01/09/2024 0739   PLT 368 09/28/2017 1459   MCV 85.8 01/09/2024 0739   MCV 88 09/28/2017 1459   MCH 28.8 01/09/2024 0739   MCHC 33.6 01/09/2024 0739   RDW 13.4 01/09/2024 0739   RDW 13.1 09/28/2017 1459   LYMPHSABS 1.8 01/07/2024 1749   LYMPHSABS 2.8 09/28/2017 1459   MONOABS 0.6 01/07/2024 1749   EOSABS 0.0 01/07/2024 1749   EOSABS 0.0 09/28/2017 1459   BASOSABS 0.0 01/07/2024 1749   BASOSABS 0.0 09/28/2017 1459    BMET    Component Value Date/Time   NA 137 01/09/2024 0739   NA 142 09/28/2017 1459   K 3.7 01/09/2024 0739   CL 108 01/09/2024 0739   CO2 20 (L) 01/09/2024 0739   GLUCOSE 104 (H) 01/09/2024 0739   BUN 5 (L) 01/09/2024 0739   BUN 3 (L) 09/28/2017 1459   CREATININE 0.74 01/09/2024 0739   CALCIUM 8.7 (L) 01/09/2024 0739   GFRNONAA >60 01/09/2024 0739    IMAGING past 24 hours VAS [place] TRANSCRANIAL DOPPLER W BUBBLES Result Date: 01/09/2024  Transcranial Doppler with Bubble Patient Name:  [name]  Date of Exam:   01/08/2024 Medical Rec #: [id number]       Accession #:    [id number] Date of Birth: 1996/03/19       Patient Gender: F Patient Age:   28 years Exam Location:  [facility] Procedure:       VAS [place] TRANSCRANIAL DOPPLER W BUBBLES Referring Phys: Gevena Mart --------------------------------------------------------------------------------  Indications: Stroke. History: Left side weakness, bilateral tingling of arms, aphasia. History of hemiplegic migraines. Limitations for diagnostic windows: Unable to insonate right transtemporal window. Comparison Study: No prior study Performing Technologist: Sherren Kerns RVS  Examination Guidelines: A complete evaluation includes B-mode imaging, spectral Doppler, color Doppler, and power Doppler as needed of all accessible portions of each vessel. Bilateral testing is considered an integral part of a complete examination. Limited examinations for reoccurring indications may be performed as noted.  Summary: No HITS at rest or during Valsalva. Negative transcranial Doppler Bubble study with no evidence of right to left intracardiac communication.  A vascular evaluation was performed. The right middle cerebral artery was studied. An IV was inserted into the patient's left Forearm. Verbal informed consent was obtained.  Negative TCD Bubble study *See table(s) above for TCD measurements and observations.  Diagnosing physician: Delia Heady MD Electronically signed by Delia Heady MD on 01/09/2024 at 1:34:41 PM.    Final    VAS [place] LOWER EXTREMITY VENOUS (DVT) Result Date: 01/08/2024  Lower Venous DVT Study Patient Name:  [name]  Date of Exam:  01/08/2024 Medical Rec #: [id number]       Accession #:    [id number] Date of Birth: 1996-09-14       Patient Gender: F Patient Age:   27 years Exam Location:  [facility] Procedure:      VAS [place] LOWER EXTREMITY VENOUS (DVT) Referring Phys: Angelique Blonder WOLFE --------------------------------------------------------------------------------  Indications: Stroke.  Limitations: Depth of vessels. Comparison Study: No prior study on file Performing Technologist: Sherren Kerns RVS  Examination Guidelines: A complete evaluation  includes B-mode imaging, spectral Doppler, color Doppler, and power Doppler as needed of all accessible portions of each vessel. Bilateral testing is considered an integral part of a complete examination. Limited examinations for reoccurring indications may be performed as noted. The reflux portion of the exam is performed with the patient in reverse Trendelenburg.  +---------+---------------+---------+-----------+----------+--------------+ RIGHT    CompressibilityPhasicitySpontaneityPropertiesThrombus Aging +---------+---------------+---------+-----------+----------+--------------+ CFV      Full           Yes      Yes                                 +---------+---------------+---------+-----------+----------+--------------+ SFJ      Full                                                        +---------+---------------+---------+-----------+----------+--------------+ FV Prox  Full                                                        +---------+---------------+---------+-----------+----------+--------------+ FV Mid   Full                                                        +---------+---------------+---------+-----------+----------+--------------+ FV DistalFull           Yes      Yes                                 +---------+---------------+---------+-----------+----------+--------------+ PFV      Full                                                        +---------+---------------+---------+-----------+----------+--------------+ POP      Full           Yes      Yes                                 +---------+---------------+---------+-----------+----------+--------------+ PTV      Full                                                        +---------+---------------+---------+-----------+----------+--------------+  PERO     Full                                                         +---------+---------------+---------+-----------+----------+--------------+ Gastroc  Full                                                        +---------+---------------+---------+-----------+----------+--------------+   +---------+---------------+---------+-----------+----------+-------------------+ LEFT     CompressibilityPhasicitySpontaneityPropertiesThrombus Aging      +---------+---------------+---------+-----------+----------+-------------------+ CFV      Full           Yes      Yes                                      +---------+---------------+---------+-----------+----------+-------------------+ SFJ      Full                                                             +---------+---------------+---------+-----------+----------+-------------------+ FV Prox  Full                                                             +---------+---------------+---------+-----------+----------+-------------------+ FV Mid   Full                                                             +---------+---------------+---------+-----------+----------+-------------------+ FV DistalFull           Yes      Yes                                      +---------+---------------+---------+-----------+----------+-------------------+ PFV      Full                                                             +---------+---------------+---------+-----------+----------+-------------------+ POP                     Yes      Yes                  patent by color and  Doppler             +---------+---------------+---------+-----------+----------+-------------------+ PTV      Full                                                             +---------+---------------+---------+-----------+----------+-------------------+ PERO     Full                                                              +---------+---------------+---------+-----------+----------+-------------------+     Summary: BILATERAL: - No evidence of deep vein thrombosis seen in the lower extremities, bilaterally. -No evidence of popliteal cyst, bilaterally.   *See table(s) above for measurements and observations. Electronically signed by Lemar Livings MD on 01/08/2024 at 5:39:09 PM.    Final     Vitals:   01/09/24 0015 01/09/24 0516 01/09/24 0744 01/09/24 1157  BP: 120/72 112/68 127/81 126/68  Pulse: 71 76 81 78  Resp: 18  18 18   Temp: 100.2 F (37.9 C) 99.8 F (37.7 C) 97.8 F (36.6 C) 98.2 F (36.8 C)  TempSrc: Oral Axillary Oral Oral  SpO2: 98% 100% 99% 99%  Weight:      Height:         PHYSICAL EXAM General:  Alert, well-nourished, well-developed pleasant young Caucasian lady in no acute distress Psych:  Mood and affect appropriate for situation CV: Regular rate and rhythm on monitor Respiratory:  Regular, unlabored respirations on room air GI: Abdomen soft and nontender   NEURO:  Mental Status: AA&Ox3, patient is able to give clear and coherent history Speech/Language: speech is without aphasia. Mild dysarthria   Cranial Nerves:  II: PERRL. Visual fields full.  III, IV, VI: EOMI. Eyelids elevate symmetrically.  V: Sensation is intact to light touch and symmetrical to face.  VII: Left facial droop VIII: hearing intact to voice. IX, X: Palate elevates symmetrically. Phonation is normal.  ZO:[id number] shrug 5/5. XII: tongue is midline without fasciculations. Motor: RUE and RLE 5/5 LUE 2/5 elbow flexion and extension. 1/5 deltoid, wrist flexion/extension, finger abduction/extension and grip LLE   4/5 with drift Tone is normal to BLE and RUE, decreased to LUE Bulk is normal  Sensation- Intact to light touch bilaterally. Extinction absent to light touch to DSS.   Coordination: FTN intact on the right, HKS: no ataxia on the righ t Gait- deferred  Most Recent NIH 5     ASSESSMENT/PLAN  Ms.  PRIYAL MUSQUIZ is a 28 y.o. female with history of migraines admitted for acute onset of left hemiplegia and facial droop.  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 Aspects is 10/10. Dense calcifications within the globus pallidus bilaterally. This is a nonspecific finding but can be seen in the setting of Fahr's disease. CTA head & neck  Normal CTA of the neck. Normal CTA Circle of Willis without significant proximal stenosis, aneurysm, or branch vessel occlusion. MRI 10 mm acute nonhemorrhagic infarct of the posterior limb of the right internal capsule. Susceptibility related calcifications in the globus pallidus bilaterally. This likely reflects the sequela of chronic microvascular ischemia. Vas [place] TCD Bubble negative for right-to-left shunt Vas [place] LE negative for DVT Will need TEE-request sent Hypercoagulable panel and ANA ordered 2D Echo EF 60-65%.  Left atrial size normal LDL 106 HgbA1c 4.9 CRP and Sed Rate normal  VTE prophylaxis - Lovenox No antithrombotic prior to admission, loaded with 300mg  Plavix now on clopidogrel 75 mg daily (allergy to NSAIDs will not start aspirin) Therapy recommendations:  CIR Disposition:  Discharge to CIR after medical work up   Hx of hemiplegic migraines  Follows with Dr. Lucia Gaskins at [facility] 1 headache per week; she estimates about 5% of them to be with left facial weakness only in the absence of headache, about 5% with left facial weakness + headache and about 90% of them with headache pain only Discontinue all medications containing estrogen or estrogen  analogues   Hyperlipidemia LDL 106, goal < 70 Add atorvastatin Continue statin at discharge  Other Stroke Risk Factors ETOH use, alcohol level <10, advised to drink no more than 2 drink(s) a day Family hx of migraines  Hospital day # 9     28 year old Caucasian lady with no significant vascular risk factors except being on estrogen-containing birth control pills and mild hyperlipidemia , family history of hemiplegic migraine presented with left-sided weakness and MRI shows 1 cm right internal capsule infarct.  Recommend further evaluation by   hypercoagulable labs and TEE.  Continue cardiac monitoring.  Aspirin and Plavix for 3 weeks followed by aspirin alone and aggressive risk factor modifications.  Statin for elevated lipids.  Physical Occupational Therapy consults.  She will likely need inpatient rehab.  Patient advised to discontinue estrogen containing birth control pills. She also has interesting finding of bilateral basal ganglia calcification.  Will check iron studies and parathyroid hormone levels.  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  But relationship with stroke risk is unclear.   Long discussion with the patient and mother at the bedside and answered questions.  Discussed with Dr. Alanda Slim.  Greater than 50% time during this 35 minute visit was spent in counseling and coordination of care about her cryptogenic stroke and discussion about stroke evaluation and prevention and treatment and answering questions.  Delia Heady, MD Medical Director [facility] Pager: [phone] 01/09/2024 2:18 PM    To contact Stroke Continuity provider, please refer to [email or web address]. After hours, contact General Neurology

## 2024-01-09 NOTE — Progress Notes (Signed)
 PROGRESS NOTE  Faith Jordan ZOX:[id number] DOB: 08-27-1996   PCP: Doreene Nest, NP  Patient is from: Home.  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    She initially thought her left-sided weakness were related to her hemiplegic migraine.  However, they usually involve her face and not other parts of her body.  She took her migraine medications but her hemiaplasia persisted which prompted her to come to ER.  Patient is on OCP for birth control.  In ED, code stroke activated.  Evaluated by neurology. Last known well at 8:00 PM on 01/06/2024.  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  CT angio head and neck without significant finding.  Noncontrast MRI brain revealed 10 mm acute nonhemorrhagic infarct of the posterior limb of the right internal capsule, susceptibility related to calcification in globus pallidus bilaterally.  Patient was loaded with Plavix, and admitted for further evaluation.  TTE, TCD and lower extremity venous Doppler negative.  Neurology recommends TEE.    Subjective: Seen and examined earlier this morning.  No major events overnight of this morning.  She says she slept last night.  Reports improvement in left upper extremity weakness.  Not able to lift it off the bed.  Able to grip my fingers but very weak.  Also reports bilateral blurry vision that does not change with covering 1 eye.  Able to see TV pictures but struggles to read what is on the board.  Patient's sister  at bedside.  Objective: Vitals:   01/08/24 2000 01/09/24 0015 01/09/24 0516 01/09/24 0744  BP: (!) 135/92 120/72 112/68 127/81  Pulse: 85 71 76 81  Resp: 20 18  18   Temp: (!) 100.4 F (38 C) 100.2 F (37.9 C) 99.8 F (37.7 C) 97.8 F (36.6 C)  TempSrc: Oral Oral Axillary Oral  SpO2: 99% 98% 100% 99%  Weight:      Height:        Examination:  GENERAL: No apparent distress.  Nontoxic. HEENT: MMM.  Vision and hearing grossly intact.  NECK: Supple.  No apparent JVD.  RESP:  No IWOB.  Fair aeration bilaterally. CVS:  RRR. Heart sounds normal.  ABD/GI/GU: BS+. Abd soft, NTND.  [name]/EXT:  Moves extremities. No apparent deformity. No edema.  SKIN: no apparent skin lesion or wound NEURO: Awake and alert.  Oriented x 4.  Speaks clearly.  Able to lift her left arm off bed.  Able to hold my finger but very weak grip strength.  4/5 in LLE.  5/5 in right arm and leg.  Light sensation intact in all dermatomes.  Patellar reflex 2+ in the right and diminished in the left.  PSYCH: Anxious and tearful.  Consultants:  Neurology  Procedures: None  Microbiology summarized: None  Assessment and plan: Acute right basal ganglia infarct: Presents with left hemiparesis and left facial droop that goes along with MRI finding.  No history of diabetes, hypertension or tobacco use.  History of familial hemiplegic migraine.  Also on OCP for birth control.  CT angio head and neck,  TTE, TCD and lower extremity venous Doppler negative.  A1c 4.9%.  LDL 106.  CRP and ESR within normal.  Loaded with Plavix 300 mg in ED.  Patient with history of angioedema due to NSAID. -Appreciate input by neurology -Continue Plavix and Lipitor.  Allergic to NSAID. -Discontinue OCP, Nurtec and Emgality -Hypercoagulable panel and ANA-pending. -TEE-scheduled for 3/25. -PT/OT/SLP-recommended CIR.  Abnormal CT head/MRI brain: Nonspecific dense calcification within globus pallidus bilaterally.  Per radiology, this can be seen  in the setting of Fahr's disease.  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 -Neurology recommended discontinuing Nurtec and Emgality.  Defer alternative to neurology   History of anxiety/panic attack/sleep disorder/insomnia -Continue home Prozac and trazodone -Ativan 1 mg 3 times daily as needed anxiety.  Binocular blurry vision: Not consistent with a CVA distribution.  Has glasses that she rarely wears.  -Outpatient follow-up with ophthalmology if it does not improve   Muscle spasms, acute: Seems to have resolved.  Hypokalemia: Resolved.  Hyperlipidemia: LDL 106.  Goal less than 70 -Continue Lipitor  Body mass index is 26.63 kg/m.           DVT prophylaxis:  enoxaparin (LOVENOX) injection 40 mg Start: 01/08/24 1000  Code Status: Full code Family Communication: Updated patient sister at bedside Level of care: Telemetry Medical Status is: Inpatient Remains inpatient appropriate because: Acute CVA   Final disposition: CIR   55 minutes with more than 50% spent in reviewing records, counseling patient/family and coordinating care.   Sch Meds:  Scheduled Meds:  atorvastatin  80 mg Oral Daily   clopidogrel  75 mg Oral Daily   enoxaparin (LOVENOX) injection  40 mg Subcutaneous Daily   FLUoxetine  40 mg Oral Daily   melatonin  5 mg Oral QHS   topiramate  50 mg Oral Daily   Continuous Infusions:   PRN Meds:.acetaminophen, labetalol, loperamide, LORazepam, polyethylene glycol, prochlorperazine, topiramate **AND** topiramate, traZODone  Antimicrobials: Anti-infectives (From admission, onward)    None        I have personally reviewed the following labs and images: CBC: Recent Labs  Lab 01/07/24 1749 01/07/24 1847 01/07/24 2328 01/09/24 0739  WBC 12.7*  --  10.9* 7.7   NEUTROABS 10.2*  --   --   --   HGB 14.6 15.0 13.4 14.0  HCT 43.5 44.0 39.4 41.7  MCV 85.6  --  85.7 85.8  PLT 355  --  311 307   BMP &GFR Recent Labs  Lab 01/07/24 1749 01/07/24 1847 01/07/24 2328 01/09/24 0739  NA 136 138 137 137  K 3.7 3.7 3.3* 3.7  CL 107 108 106 108  CO2 19*  --  20* 20*  GLUCOSE 97 95 102* 104*  BUN 10 10 7  5*  CREATININE 0.72 0.70 0.67 0.74  CALCIUM 9.1  --  8.6* 8.7*  MG 1.9  --  1.7 2.0  PHOS  --   --  3.1 3.4   Estimated Creatinine Clearance: 112.1 mL/min (by C-G formula based on SCr of 0.74 mg/dL). Liver & Pancreas: Recent Labs  Lab 01/07/24 1749 01/09/24 0739  AST 19  --   ALT 14  --   ALKPHOS 52  --   BILITOT 0.7  --   PROT 6.9  --   ALBUMIN 3.7 3.5   No results for input(s): "LIPASE", "AMYLASE" in  the last 168 hours. No results for input(s): "AMMONIA" in the last 168 hours. Diabetic: Recent Labs    01/07/24 2328  HGBA1C 4.9   Recent Labs  Lab 01/08/24 1014  GLUCAP 91   Cardiac Enzymes: No results for input(s): "CKTOTAL", "CKMB", "CKMBINDEX", "TROPONINI" in the last 168 hours. No results for input(s): "PROBNP" in the last 8760 hours. Coagulation Profile: Recent Labs  Lab 01/07/24 1822  INR 1.0   Thyroid Function Tests: No results for input(s): "TSH", "T4TOTAL", "FREET4", "T3FREE", "THYROIDAB" in the last 72 hours. Lipid Profile: Recent Labs    01/07/24 2338  CHOL 151  HDL 30*  LDLCALC 106*  TRIG 73  CHOLHDL 5.0   Anemia Panel: No results for input(s): "VITAMINB12", "FOLATE", "FERRITIN", "TIBC", "IRON", "RETICCTPCT" in the last 72 hours. Urine analysis:    Component Value Date/Time   COLORURINE YELLOW 01/07/2024 1949   APPEARANCEUR CLEAR 01/07/2024 1949   APPEARANCEUR Clear 09/08/2022 0800   LABSPEC 1.015 01/07/2024 1949   PHURINE 6.0 01/07/2024 1949   GLUCOSEU NEGATIVE 01/07/2024 1949   HGBUR SMALL (A) 01/07/2024 1949   BILIRUBINUR NEGATIVE 01/07/2024 1949   BILIRUBINUR Negative 09/08/2022 0800    KETONESUR 40 (A) 01/07/2024 1949   PROTEINUR NEGATIVE 01/07/2024 1949   UROBILINOGEN 0.2 07/20/2022 1553   UROBILINOGEN 0.2 03/31/2013 2122   NITRITE NEGATIVE 01/07/2024 1949   LEUKOCYTESUR NEGATIVE 01/07/2024 1949   Sepsis Labs: Invalid input(s): "PROCALCITONIN", "LACTICIDVEN"  Microbiology: No results found for this or any previous visit (from the past 240 hours).  Radiology Studies: VAS [place] LOWER EXTREMITY VENOUS (DVT) Result Date: 01/08/2024  Lower Venous DVT Study Patient Name:  Faith Jordan  Date of Exam:   01/08/2024 Medical Rec #: [id number]       Accession #:    [id number] Date of Birth: 1996/01/20       Patient Gender: F Patient Age:   40 years Exam Location:  [facility] Procedure:      VAS [place] LOWER EXTREMITY VENOUS (DVT) Referring Phys: Gevena Mart --------------------------------------------------------------------------------  Indications: Stroke.  Limitations: Depth of vessels. Comparison Study: No prior study on file Performing Technologist: Sherren Kerns RVS  Examination Guidelines: A complete evaluation includes B-mode imaging, spectral Doppler, color Doppler, and power Doppler as needed of all accessible portions of each vessel. Bilateral testing is considered an integral part of a complete examination. Limited examinations for reoccurring indications may be performed as noted. The reflux portion of the exam is performed with the patient in reverse Trendelenburg.  +---------+---------------+---------+-----------+----------+--------------+ RIGHT    CompressibilityPhasicitySpontaneityPropertiesThrombus Aging +---------+---------------+---------+-----------+----------+--------------+ CFV      Full           Yes      Yes                                 +---------+---------------+---------+-----------+----------+--------------+ SFJ      Full                                                         +---------+---------------+---------+-----------+----------+--------------+ FV Prox  Full                                                        +---------+---------------+---------+-----------+----------+--------------+  FV Mid   Full                                                        +---------+---------------+---------+-----------+----------+--------------+ FV DistalFull           Yes      Yes                                 +---------+---------------+---------+-----------+----------+--------------+ PFV      Full                                                        +---------+---------------+---------+-----------+----------+--------------+ POP      Full           Yes      Yes                                 +---------+---------------+---------+-----------+----------+--------------+ PTV      Full                                                        +---------+---------------+---------+-----------+----------+--------------+ PERO     Full                                                        +---------+---------------+---------+-----------+----------+--------------+ Gastroc  Full                                                        +---------+---------------+---------+-----------+----------+--------------+   +---------+---------------+---------+-----------+----------+-------------------+ LEFT     CompressibilityPhasicitySpontaneityPropertiesThrombus Aging      +---------+---------------+---------+-----------+----------+-------------------+ CFV      Full           Yes      Yes                                      +---------+---------------+---------+-----------+----------+-------------------+ SFJ      Full                                                             +---------+---------------+---------+-----------+----------+-------------------+ FV Prox  Full                                                              +---------+---------------+---------+-----------+----------+-------------------+  FV Mid   Full                                                             +---------+---------------+---------+-----------+----------+-------------------+ FV DistalFull           Yes      Yes                                      +---------+---------------+---------+-----------+----------+-------------------+ PFV      Full                                                             +---------+---------------+---------+-----------+----------+-------------------+ POP                     Yes      Yes                  patent by color and                                                       Doppler             +---------+---------------+---------+-----------+----------+-------------------+ PTV      Full                                                             +---------+---------------+---------+-----------+----------+-------------------+ PERO     Full                                                             +---------+---------------+---------+-----------+----------+-------------------+     Summary: BILATERAL: - No evidence of deep vein thrombosis seen in the lower extremities, bilaterally. -No evidence of popliteal cyst, bilaterally.   *See table(s) above for measurements and observations. Electronically signed by Lemar Livings MD on 01/08/2024 at 5:39:09 PM.    Final    VAS [place] TRANSCRANIAL DOPPLER W BUBBLES Result Date: 01/08/2024  Transcranial Doppler with Bubble Patient Name:  Faith Jordan  Date of Exam:   01/08/2024 Medical Rec #: [id number]       Accession #:    [id number] Date of Birth: [birth year]       Patient Gender: F Patient Age:   35 years Exam Location:  [facility] Procedure:      VAS [place] TRANSCRANIAL DOPPLER W BUBBLES Referring Phys: Gevena Mart --------------------------------------------------------------------------------  Indications: Stroke. History: Left  side weakness, bilateral tingling of arms, aphasia. History of hemiplegic migraines. Limitations for diagnostic  windows: Unable to insonate right transtemporal window. Comparison Study: No prior study Performing Technologist: Sherren Kerns RVS  Examination Guidelines: A complete evaluation includes B-mode imaging, spectral Doppler, color Doppler, and power Doppler as needed of all accessible portions of each vessel. Bilateral testing is considered an integral part of a complete examination. Limited examinations for reoccurring indications may be performed as noted.  Summary: No HITS at rest or during Valsalva. Negative transcranial Doppler Bubble study with no evidence of right to left intracardiac communication.  A vascular evaluation was performed. The right middle cerebral artery was studied. An IV was inserted into the patient's left Forearm. Verbal informed consent was obtained.  *See table(s) above for TCD measurements and observations.    Preliminary       Wasif Simonich T. Yanis Larin [facility]  If 7PM-7AM, please contact night-coverage [email or web address] 01/09/2024, 10:06 AM

## 2024-01-09 NOTE — Telephone Encounter (Signed)
Pt's mother, Babette Relic Barkow Pt in the hospital due to stroke since Saturday, 01/07/24. [employer] sent certification for FMLA to Dr. Lucia Gaskins.  She will need extensive rehab. Can respond through MyChart

## 2024-01-09 NOTE — Progress Notes (Signed)
 Physical Therapy Treatment Patient Details Name: Faith Jordan MRN: [id number] DOB: 11/30/1995 Today's Date: 01/09/2024   History of Present Illness 28 y.o. female presents to [facility] 01/07/24 with L sided weakness, Code Stroke called. 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 PMHx: familial hemiplegic migraine, angioedema 2017, dysautonomia    PT Comments  Pt tolerated treatment well today. Pt today was able to progress ambulation with +2 Mod A using L platform RW and a close chair follow. Pt required 4 seated rest breaks however was very motivated and determined to ambulate further. No change in DC/DME recs at this time. PT will continue to follow.     If plan is discharge home, recommend the following: A lot of help with walking and/or transfers;A lot of help with bathing/dressing/bathroom;Assistance with cooking/housework;Assist for transportation;Help with stairs or ramp for entrance   Can travel by private vehicle        Equipment Recommendations  Wheelchair (measurements PT);Wheelchair cushion (measurements PT);Other (comment)    Recommendations for Other Services       Precautions / Restrictions Precautions Precautions: Fall Recall of Precautions/Restrictions: Intact Precaution/Restrictions Comments: SBP < 180 Restrictions Weight Bearing Restrictions Per Provider Order: No     Mobility  Bed Mobility Overal bed mobility: Needs Assistance Bed Mobility: Supine to Sit, Sit to Supine     Supine to sit: Supervision Sit to supine: Supervision        Transfers Overall transfer level: Needs assistance Equipment used: Left platform walker Transfers: Sit to/from Stand Sit to Stand: Contact guard assist, Min assist           General transfer comment: CGA mostly however MinA to block L LE due to occasional buckling once fatigued.    Ambulation/Gait Ambulation/Gait assistance: Min assist, Mod assist, +2 physical  assistance, +2 safety/equipment Gait Distance (Feet): 120 Feet [phone]) Assistive device: Left platform walker Gait Pattern/deviations: Ataxic, Narrow base of support, Decreased stride length, Step-through pattern, Knees buckling, Scissoring, Drifts right/left Gait velocity: decreased     General Gait Details: Pt able to complete multiple trials of ambulation in hallway with Min/Mod A. Mod A once fatigued. Close chair follow provided with patient requiring 4 seated rest breaks. Increased L knee buckling noted with fatigue however no overt LOB.   Stairs             Wheelchair Mobility     Tilt Bed    Modified Rankin (Stroke Patients Only)       Balance Overall balance assessment: Needs assistance Sitting-balance support: Feet supported, Single extremity supported Sitting balance-Leahy Scale: Fair     Standing balance support: Single extremity supported, During functional activity, Reliant on assistive device for balance Standing balance-Leahy Scale: Poor Standing balance comment: reliant on platform RW                            Communication Communication Communication: Impaired Factors Affecting Communication: Reduced clarity of speech  Cognition Arousal: Alert Behavior During Therapy: WFL for tasks assessed/performed   PT - Cognitive impairments: No apparent impairments                         Following commands: Intact      Cueing Cueing Techniques: Verbal cues, Tactile cues  Exercises      General Comments General comments (skin integrity, edema, etc.): VSS      Pertinent Vitals/Pain Pain  Assessment Pain Assessment: No/denies pain    Home Living                          Prior Function            PT Goals (current goals can now be found in the care plan section) Progress towards PT goals: Progressing toward goals    Frequency    Min 3X/week      PT Plan      Co-evaluation               AM-PAC PT "6 Clicks" Mobility   Outcome Measure  Help needed turning from your back to your side while in a flat bed without using bedrails?: None Help needed moving from lying on your back to sitting on the side of a flat bed without using bedrails?: A Little Help needed moving to and from a bed to a chair (including a wheelchair)?: A Little Help needed standing up from a chair using your arms (e.g., wheelchair or bedside chair)?: A Little Help needed to walk in hospital room?: A Lot Help needed climbing 3-5 steps with a railing? : Total 6 Click Score: 16    End of Session Equipment Utilized During Treatment: Gait belt Activity Tolerance: Patient tolerated treatment well Patient left: in bed;with call bell/phone within reach;with family/visitor present Nurse Communication: Mobility status PT Visit Diagnosis: Unsteadiness on feet (R26.81);Other abnormalities of gait and mobility (R26.89);Muscle weakness (generalized) (M62.81);Hemiplegia and hemiparesis Hemiplegia - Right/Left: Left Hemiplegia - caused by: Cerebral infarction     Time: 6295-2841 PT Time Calculation (min) (ACUTE ONLY): 30 min  Charges:    $Gait Training: 8-22 mins $Therapeutic Activity: 8-22 mins PT General Charges $$ ACUTE PT VISIT: 1 Visit                     Shela Nevin, PT, DPT Acute Rehab Services [phone]    [name] 01/09/2024, 12:20 PM

## 2024-01-10 ENCOUNTER — Inpatient Hospital Stay (HOSPITAL_COMMUNITY): Payer: Self-pay

## 2024-01-10 ENCOUNTER — Inpatient Hospital Stay (HOSPITAL_COMMUNITY)

## 2024-01-10 ENCOUNTER — Other Ambulatory Visit: Payer: Self-pay

## 2024-01-10 ENCOUNTER — Encounter: Payer: Self-pay | Admitting: Family Medicine

## 2024-01-10 ENCOUNTER — Encounter (HOSPITAL_COMMUNITY)
Admission: EM | Disposition: A | Discharge status: Rehabilitation Facility | Payer: Self-pay | Source: Home / Self Care | Attending: Student

## 2024-01-10 DIAGNOSIS — F1090 Alcohol use, unspecified, uncomplicated: Secondary | ICD-10-CM | POA: Diagnosis not present

## 2024-01-10 DIAGNOSIS — J45909 Unspecified asthma, uncomplicated: Secondary | ICD-10-CM | POA: Diagnosis not present

## 2024-01-10 DIAGNOSIS — I6389 Other cerebral infarction: Secondary | ICD-10-CM | POA: Diagnosis not present

## 2024-01-10 DIAGNOSIS — R29708 NIHSS score 8: Secondary | ICD-10-CM | POA: Diagnosis not present

## 2024-01-10 DIAGNOSIS — E785 Hyperlipidemia, unspecified: Secondary | ICD-10-CM | POA: Diagnosis not present

## 2024-01-10 DIAGNOSIS — I639 Cerebral infarction, unspecified: Secondary | ICD-10-CM | POA: Diagnosis not present

## 2024-01-10 HISTORY — PX: TRANSESOPHAGEAL ECHOCARDIOGRAM (CATH LAB): EP1270

## 2024-01-10 LAB — RENAL FUNCTION PANEL
Albumin: 3.7 g/dL (ref 3.5–5.0)
Anion gap: 8 (ref 5–15)
BUN: 8 mg/dL (ref 6–20)
CO2: 19 mmol/L — ABNORMAL LOW (ref 22–32)
Calcium: 8.7 mg/dL — ABNORMAL LOW (ref 8.9–10.3)
Chloride: 108 mmol/L (ref 98–111)
Creatinine, Ser: 0.8 mg/dL (ref 0.44–1.00)
GFR, Estimated: >60 mL/min (ref >60)
Glucose, Bld: 87 mg/dL (ref 70–99)
Phosphorus: 3.9 mg/dL (ref 2.5–4.6)
Potassium: 3.4 mmol/L — ABNORMAL LOW (ref 3.5–5.1)
Sodium: 135 mmol/L (ref 135–145)

## 2024-01-10 LAB — PTH, INTACT AND CALCIUM
Calcium, Total (PTH): 8.9 mg/dL (ref 8.7–10.2)
PTH: 17 pg/mL (ref 15–65)

## 2024-01-10 LAB — CBC
HCT: 43.9 % (ref 36.0–46.0)
Hemoglobin: 14.7 g/dL (ref 12.0–15.0)
MCH: 28.5 pg (ref 26.0–34.0)
MCHC: 33.5 g/dL (ref 30.0–36.0)
MCV: 85.1 fL (ref 80.0–100.0)
Platelets: 336 10*3/uL (ref 150–400)
RBC: 5.16 MIL/uL — ABNORMAL HIGH (ref 3.87–5.11)
RDW: 13.3 % (ref 11.5–15.5)
WBC: 8.4 10*3/uL (ref 4.0–10.5)
nRBC: 0 % (ref 0.0–0.2)

## 2024-01-10 LAB — MAGNESIUM: Magnesium: 1.9 mg/dL (ref 1.7–2.4)

## 2024-01-10 LAB — CARDIOLIPIN ANTIBODIES, IGG, IGM, IGA
Anticardiolipin IgA: <9 U/mL (ref 0–11)
Anticardiolipin IgG: <9 GPL U/mL (ref 0–14)
Anticardiolipin IgM: 9 [MPL'U]/mL (ref 0–12)

## 2024-01-10 LAB — HOMOCYSTEINE: Homocysteine: 13.3 umol/L (ref 0.0–14.5)

## 2024-01-10 LAB — ECHO TEE

## 2024-01-10 SURGERY — TRANSESOPHAGEAL ECHOCARDIOGRAM (TEE) (CATHLAB)
Anesthesia: Monitor Anesthesia Care

## 2024-01-10 MED ORDER — POTASSIUM CHLORIDE CRYS ER 20 MEQ PO TBCR
60.0000 meq | EXTENDED_RELEASE_TABLET | Freq: Once | ORAL | Status: AC
  Administered 2024-01-10: 60 meq via ORAL
  Filled 2024-01-10: qty 3

## 2024-01-10 MED ORDER — LIDOCAINE 5 % EX PTCH
1.0000 | MEDICATED_PATCH | CUTANEOUS | Status: DC
  Administered 2024-01-10 – 2024-01-11 (×3): 1 via TRANSDERMAL
  Filled 2024-01-10 (×3): qty 1

## 2024-01-10 MED ORDER — LIDOCAINE 2% (20 MG/ML) 5 ML SYRINGE
INTRAMUSCULAR | Status: DC | PRN
  Administered 2024-01-10: 60 mg via INTRAVENOUS

## 2024-01-10 MED ORDER — SODIUM BICARBONATE 650 MG PO TABS
650.0000 mg | ORAL_TABLET | Freq: Three times a day (TID) | ORAL | Status: DC
  Administered 2024-01-10 – 2024-01-12 (×7): 650 mg via ORAL
  Filled 2024-01-10 (×7): qty 1

## 2024-01-10 MED ORDER — PROPOFOL 500 MG/50ML IV EMUL
INTRAVENOUS | Status: DC | PRN
  Administered 2024-01-10: 150 ug/kg/min via INTRAVENOUS

## 2024-01-10 MED ORDER — SODIUM CHLORIDE 0.9 % IV SOLN: INTRAVENOUS | Status: DC | PRN

## 2024-01-10 MED ORDER — PROPOFOL 10 MG/ML IV BOLUS
INTRAVENOUS | Status: DC | PRN
  Administered 2024-01-10: 60 mg via INTRAVENOUS
  Administered 2024-01-10 (×2): 50 mg via INTRAVENOUS
  Administered 2024-01-10: 30 mg via INTRAVENOUS

## 2024-01-10 NOTE — Progress Notes (Signed)
 OT Cancellation Note  Patient Details Name: CORNELIA WALRAVEN MRN: [id number] DOB: 08-30-1996   Cancelled Treatment:    Reason Eval/Treat Not Completed: Patient at procedure or test/ unavailable. Off the floor for TEE. Will follow up.    Lindon Romp OT Acute Rehabilitation Services Office [phone]   Evette Georges 01/10/2024, 10:35 AM

## 2024-01-10 NOTE — Progress Notes (Signed)
 PROGRESS NOTE  [name]:[id number] DOB: March 26, 1996   PCP: Doreene Nest, NP  Patient is from: Home.  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    She initially thought her left-sided weakness were related to her hemiplegic migraine.  However, they usually involve her face and not other parts of her body.  She took her migraine medications but her hemiaplasia persisted which prompted her to come to ER.  Patient is on OCP for birth control.  In ED, code stroke activated.  Evaluated by neurology. Last known well at 8:00 PM on 01/06/2024.  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  CT angio head and neck without significant finding.  Noncontrast MRI brain revealed 10 mm acute nonhemorrhagic infarct of the posterior limb of the right internal capsule, susceptibility related to calcification in globus pallidus bilaterally.  Patient was loaded with Plavix, and admitted for further evaluation.  TTE, TEE, TCD and lower extremity venous Doppler negative.  Therapy recommended CIR.    Subjective: Seen and examined earlier this afternoon after she returned from TEE.  No major events overnight of this morning.  New complaints other than left hand and arm weakness.  Objective: Vitals:   01/10/24 1049 01/10/24 1100 01/10/24 1110 01/10/24 1127  BP: 127/86 133/89 (!) 145/94 119/82  Pulse: 90 82 81 75  Resp: 16 14 17 18   Temp:    97.7 F (36.5 C)  TempSrc:    Oral  SpO2: 99% 98% 99% 100%   Weight:      Height:        Examination:  GENERAL: No apparent distress.  Nontoxic. HEENT: MMM.  Vision and hearing grossly intact.  NECK: Supple.  No apparent JVD.  RESP:  No IWOB.  Fair aeration bilaterally. CVS:  RRR. Heart sounds normal.  ABD/GI/GU: BS+. Abd soft, NTND.  [name]/EXT:  Moves extremities. No apparent deformity. No edema.  SKIN: no apparent skin lesion or wound NEURO: Awake and alert.  Oriented x 4.  Speaks clearly.  Able to lift her left arm off bed.  Difficult to make fist due to weakness.  4/5 in LLE.  5/5 in right arm and leg.  Light sensation intact in all dermatomes.   PSYCH: Anxious and tearful.  Consultants:  Neurology  Procedures: 01/10/2024-TEE negative.  Microbiology summarized: None  Assessment and plan: Acute right basal ganglia infarct: Presents with left hemiparesis and left facial droop that goes along with MRI finding.  No history of diabetes, hypertension or tobacco use.  History of familial hemiplegic migraine.  Also on OCP for birth control.  CT angio head and neck, TTE, TEE, TCD and LE venous Doppler negative.  A1c 4.9%.  LDL 106.  CRP and ESR within normal.  Hypercoagulable labs negative so far.  Loaded with Plavix 300 mg in ED.  Patient with history of angioedema due to NSAID. -Appreciate input by neurology -Continue Plavix and Lipitor.  Not on aspirin due to NSAID allergy. -Discontinue OCP -Follow the rest of hypercoagulable labs -PT/OT/SLP-recommended CIR. insurance authorization pending.  Abnormal  CT head/MRI brain: Nonspecific dense calcification within globus pallidus bilaterally.  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 Neurology recommended discontinuing Nurtec and Emgality on initial  consultation.  However, she states that she can continue those medications after talking to neurology, Dr. Pearlean Brownie.  -Will clarify with neurology   History of anxiety/panic attack/sleep disorder/insomnia -Continue home Prozac and trazodone -Ativan 1 mg 3 times daily as needed anxiety.  Binocular blurry vision: Not consistent with a CVA distribution.  Has glasses that she rarely wears.  -Outpatient follow-up with ophthalmology if it does not improve   Muscle spasms, acute: Seems to have resolved.  Hypokalemia:  -Monitor replenish as appropriate  Hyperlipidemia: LDL 106.  Goal less than 70 -Continue Lipitor  Body mass index is 26.63 kg/m.           DVT prophylaxis:  enoxaparin (LOVENOX) injection 40 mg Start: 01/08/24 1000  Code Status: Full code Family Communication: Updated patient sister at bedside Level of care: Telemetry Medical Status is: Inpatient Remains inpatient appropriate because: Acute CVA   Final disposition: CIR   35 minutes with more than 50% spent in reviewing records, counseling patient/family and coordinating care.   Sch Meds:  Scheduled Meds:  atorvastatin  80 mg Oral Daily   clopidogrel  75 mg Oral Daily   enoxaparin (LOVENOX) injection  40 mg Subcutaneous Daily   FLUoxetine  40 mg Oral Daily   lidocaine  1 patch Transdermal Q24H   melatonin  5 mg Oral QHS   sodium bicarbonate  650 mg Oral TID   topiramate  50 mg Oral Daily   Continuous Infusions:   PRN Meds:.acetaminophen, labetalol, loperamide, LORazepam, polyethylene glycol, prochlorperazine, topiramate **AND** topiramate, traZODone  Antimicrobials: Anti-infectives (From admission, onward)    None        I have personally reviewed the following labs and images: CBC: Recent Labs  Lab 01/07/24 1749 01/07/24 1847 01/07/24 2328 01/09/24 0739 01/10/24 0513  WBC 12.7*  --  10.9* 7.7 8.4  NEUTROABS 10.2*  --   --   --   --   HGB 14.6 15.0 13.4 14.0 14.7  HCT 43.5 44.0 39.4  41.7 43.9  MCV 85.6  --  85.7 85.8 85.1  PLT 355  --  311 307 336   BMP &GFR Recent Labs  Lab 01/07/24 1749 01/07/24 1847 01/07/24 2328 01/09/24 0739 01/10/24 0513  NA 136 138 137 137 135  K 3.7 3.7 3.3* 3.7 3.4*  CL 107 108 106 108 108  CO2 19*  --  20* 20* 19*  GLUCOSE 97 95 102* 104* 87  BUN 10 10 7  5* 8  CREATININE 0.72 0.70 0.67 0.74 0.80  CALCIUM 9.1  --  8.6* 8.7* 8.7*  MG 1.9  --  1.7 2.0 1.9  PHOS  --   --  3.1 3.4 3.9   Estimated Creatinine Clearance: 112.1 mL/min (by C-G formula based on SCr of 0.8 mg/dL). Liver & Pancreas: Recent Labs  Lab 01/07/24 1749 01/09/24 0739 01/10/24 0513  AST 19  --   --   ALT 14  --   --   ALKPHOS 52  --   --   BILITOT 0.7  --   --   PROT 6.9  --   --  ALBUMIN 3.7 3.5 3.7   No results for input(s): "LIPASE", "AMYLASE" in the last 168 hours. No results for input(s): "AMMONIA" in the last 168 hours. Diabetic: Recent Labs    01/07/24 2328  HGBA1C 4.9   Recent Labs  Lab 01/08/24 1014  GLUCAP 91   Cardiac Enzymes: No results for input(s): "CKTOTAL", "CKMB", "CKMBINDEX", "TROPONINI" in the last 168 hours. No results for input(s): "PROBNP" in the last 8760 hours. Coagulation Profile: Recent Labs  Lab 01/07/24 1822  INR 1.0   Thyroid Function Tests: No results for input(s): "TSH", "T4TOTAL", "FREET4", "T3FREE", "THYROIDAB" in the last 72 hours. Lipid Profile: Recent Labs    01/07/24 2338  CHOL 151  HDL 30*  LDLCALC 106*  TRIG 73  CHOLHDL 5.0   Anemia Panel: Recent Labs    01/09/24 1518  FERRITIN 19  TIBC 566*  IRON 75   Urine analysis:    Component Value Date/Time   COLORURINE YELLOW 01/07/2024 1949   APPEARANCEUR CLEAR 01/07/2024 1949   APPEARANCEUR Clear 09/08/2022 0800   LABSPEC 1.015 01/07/2024 1949   PHURINE 6.0 01/07/2024 1949   GLUCOSEU NEGATIVE 01/07/2024 1949   HGBUR SMALL (A) 01/07/2024 1949   BILIRUBINUR NEGATIVE 01/07/2024 1949   BILIRUBINUR Negative 09/08/2022 0800   KETONESUR 40  (A) 01/07/2024 1949   PROTEINUR NEGATIVE 01/07/2024 1949   UROBILINOGEN 0.2 07/20/2022 1553   UROBILINOGEN 0.2 03/31/2013 2122   NITRITE NEGATIVE 01/07/2024 1949   LEUKOCYTESUR NEGATIVE 01/07/2024 1949   Sepsis Labs: Invalid input(s): "PROCALCITONIN", "LACTICIDVEN"  Microbiology: No results found for this or any previous visit (from the past 240 hours).  Radiology Studies: ECHO TEE Result Date: 01/10/2024    TRANSESOPHOGEAL ECHO REPORT   Patient Name:   Faith Jordan Date of Exam: 01/10/2024 Medical Rec #:  [id number]      Height:       67.0 in Accession #:    [id number]     Weight:       170.0 lb Date of Birth:  1996-03-25      BSA:          1.887 m Patient Age:    28 years       BP:           139/85 mmHg Patient Gender: F              HR:           96 bpm. Exam Location:  Inpatient Procedure: Transesophageal Echo, Color Doppler and Cardiac Doppler (Both            Spectral and Color Flow Doppler were utilized during procedure). Indications:     Stroke  History:         Patient has prior history of Echocardiogram examinations, most                  recent 01/08/2024.  Sonographer:     Harriette Bouillon RDCS Referring Phys:  Almon Hercules Diagnosing Phys: Donato Schultz MD PROCEDURE: The transesophogeal probe was passed without difficulty through the esophogus of the patient. Sedation performed by different physician. The patient's vital signs; including heart rate, blood pressure, and oxygen saturation; remained stable throughout the procedure. The patient developed no complications during the procedure.  IMPRESSIONS  1. Left ventricular ejection fraction, by estimation, is 60 to 65%. The left ventricle has normal function. The left ventricle has no regional wall motion abnormalities.  2. Right ventricular systolic function is normal. The right ventricular size  is normal.  3. No left atrial/left atrial appendage thrombus was detected.  4. The mitral valve is normal in structure. No evidence of mitral valve  regurgitation. No evidence of mitral stenosis.  5. The aortic valve is normal in structure. Aortic valve regurgitation is not visualized. No aortic stenosis is present.  6. The inferior vena cava is normal in size with greater than 50% respiratory variability, suggesting right atrial pressure of 3 mmHg.  7. Agitated saline contrast bubble study was negative, with no evidence of any interatrial shunt. Conclusion(s)/Recommendation(s): Normal biventricular function without evidence of hemodynamically significant valvular heart disease. FINDINGS  Left Ventricle: Left ventricular ejection fraction, by estimation, is 60 to 65%. The left ventricle has normal function. The left ventricle has no regional wall motion abnormalities. The left ventricular internal cavity size was normal in size. There is  no left ventricular hypertrophy. Right Ventricle: The right ventricular size is normal. No increase in right ventricular wall thickness. Right ventricular systolic function is normal. Left Atrium: Left atrial size was normal in size. No left atrial/left atrial appendage thrombus was detected. Right Atrium: Right atrial size was normal in size. Pericardium: There is no evidence of pericardial effusion. Mitral Valve: The mitral valve is normal in structure. No evidence of mitral valve regurgitation. No evidence of mitral valve stenosis. Tricuspid Valve: The tricuspid valve is normal in structure. Tricuspid valve regurgitation is not demonstrated. No evidence of tricuspid stenosis. Aortic Valve: The aortic valve is normal in structure. Aortic valve regurgitation is not visualized. No aortic stenosis is present. Pulmonic Valve: The pulmonic valve was normal in structure. Pulmonic valve regurgitation is not visualized. No evidence of pulmonic stenosis. Aorta: The aortic root is normal in size and structure. Venous: The inferior vena cava is normal in size with greater than 50% respiratory variability, suggesting right atrial  pressure of 3 mmHg. IAS/Shunts: There is redundancy of the interatrial septum. No atrial level shunt detected by color flow Doppler. Agitated saline contrast bubble study was negative, with no evidence of any interatrial shunt. There is no evidence of a patent foramen ovale. There is no evidence of an atrial septal defect. Donato Schultz MD Electronically signed by Donato Schultz MD Signature Date/Time: 01/10/2024/12:30:28 PM    Final    EP STUDY Result Date: 01/10/2024 See surgical note for result.     Charlissa Petros T. Payslee Bateson [facility]  If 7PM-7AM, please contact night-coverage [email or web address] 01/10/2024, 1:23 PM

## 2024-01-10 NOTE — Transfer of Care (Signed)
 Immediate Anesthesia Transfer of Care Note  Patient: Faith Jordan  Procedure(s) Performed: TRANSESOPHAGEAL ECHOCARDIOGRAM  Patient Location: PACU and Cath Lab  Anesthesia Type:MAC  Level of Consciousness: awake  Airway & Oxygen Therapy: Patient Spontanous Breathing and Patient connected to nasal cannula oxygen  Post-op Assessment: Report given to RN and Post -op Vital signs reviewed and stable  Post vital signs: Reviewed and stable  Last Vitals:  Vitals Value Taken Time  BP 127/86 01/10/24 1050  Temp    Pulse 96 01/10/24 1050  Resp 16 01/10/24 1050  SpO2 98% 01/10/24 1050    Last Pain:  Vitals:   01/10/24 0814  TempSrc: Temporal  PainSc: 0-No pain         Complications: No notable events documented.

## 2024-01-10 NOTE — Interval H&P Note (Signed)
 History and Physical Interval Note:  01/10/2024 10:16 AM  Faith Jordan  has presented today for surgery, with the diagnosis of CVA.  The various methods of treatment have been discussed with the patient and family. After consideration of risks, benefits and other options for treatment, the patient has consented to  Procedure(s): TRANSESOPHAGEAL ECHOCARDIOGRAM (N/A) as a surgical intervention.  The patient's history has been reviewed, patient examined, no change in status, stable for surgery.  I have reviewed the patient's chart and labs.  Questions were answered to the patient's satisfaction.     [employer]

## 2024-01-10 NOTE — Plan of Care (Signed)
  Problem: Ischemic Stroke/TIA Tissue Perfusion: Goal: Complications of ischemic stroke/TIA will be minimized Outcome: Progressing   Problem: Education: Goal: Knowledge of secondary prevention will improve (MUST DOCUMENT ALL) Outcome: Progressing   Problem: Self-Care: Goal: Ability to participate in self-care as condition permits will improve Outcome: Progressing   Problem: Activity: Goal: Risk for activity intolerance will decrease Outcome: Progressing   Problem: Coping: Goal: Level of anxiety will decrease Outcome: Progressing

## 2024-01-10 NOTE — Progress Notes (Signed)
 Inpatient Rehab Admissions Coordinator:    CIR following. Insurance Berkley Harvey remains pending. Benefits reviewed with pt.'s mother.  Megan Salon, MS, CCC-SLP Rehab Admissions Coordinator  [phone] (celll) [phone] (office)

## 2024-01-10 NOTE — Progress Notes (Signed)
 STROKE TEAM PROGRESS NOTE   INTERIM HISTORY/SUBJECTIVE Her mother and friends are in the room.  Patient has just returned from TEE which was normal.  Patient states she is noted improvement in her left-sided strength.   Serum iron studies appear normal.  ANA is negative.  Beta-2 glycoprotein and lupus anticoagulant are negative.  Protein S and C activity and normal Antithrombin III is normal CBC    Component Value Date/Time   WBC 8.4 01/10/2024 0513   RBC 5.16 (H) 01/10/2024 0513   HGB 14.7 01/10/2024 0513   HGB 11.8 09/28/2017 1459   HCT 43.9 01/10/2024 0513   HCT 36.7 09/28/2017 1459   PLT 336 01/10/2024 0513   PLT 368 09/28/2017 1459   MCV 85.1 01/10/2024 0513   MCV 88 09/28/2017 1459   MCH 28.5 01/10/2024 0513   MCHC 33.5 01/10/2024 0513   RDW 13.3 01/10/2024 0513   RDW 13.1 09/28/2017 1459   LYMPHSABS 1.8 01/07/2024 1749   LYMPHSABS 2.8 09/28/2017 1459   MONOABS 0.6 01/07/2024 1749   EOSABS 0.0 01/07/2024 1749   EOSABS 0.0 09/28/2017 1459   BASOSABS 0.0 01/07/2024 1749   BASOSABS 0.0 09/28/2017 1459    BMET    Component Value Date/Time   NA 135 01/10/2024 0513   NA 142 09/28/2017 1459   K 3.4 (L) 01/10/2024 0513   CL 108 01/10/2024 0513   CO2 19 (L) 01/10/2024 0513   GLUCOSE 87 01/10/2024 0513   BUN 8 01/10/2024 0513   BUN 3 (L) 09/28/2017 1459   CREATININE 0.80 01/10/2024 0513   CALCIUM 8.7 (L) 01/10/2024 0513   GFRNONAA >60 01/10/2024 0513    IMAGING past 24 hours ECHO TEE Result Date: 01/10/2024    TRANSESOPHOGEAL ECHO REPORT   Patient Name:   Faith Jordan Date of Exam: 01/10/2024 Medical Rec #:  [id number]      Height:       67.0 in Accession #:    [id number]     Weight:       170.0 lb Date of Birth:  06/26/1996      BSA:          1.887 m Patient Age:    28 years       BP:           139/85 mmHg Patient Gender: F              HR:           96 bpm. Exam Location:  Inpatient Procedure: Transesophageal Echo, Color Doppler and Cardiac Doppler (Both             Spectral and Color Flow Doppler were utilized during procedure). Indications:     Stroke  History:         Patient has prior history of Echocardiogram examinations, most                  recent 01/08/2024.  Sonographer:     Harriette Bouillon RDCS Referring Phys:  Almon Hercules Diagnosing Phys: Donato Schultz MD PROCEDURE: The transesophogeal probe was passed without difficulty through the esophogus of the patient. Sedation performed by different physician. The patient's vital signs; including heart rate, blood pressure, and oxygen saturation; remained stable throughout the procedure. The patient developed no complications during the procedure.  IMPRESSIONS  1. Left ventricular ejection fraction, by estimation, is 60 to 65%. The left ventricle has normal function. The left ventricle has no regional wall motion abnormalities.  2.  Right ventricular systolic function is normal. The right ventricular size is normal.  3. No left atrial/left atrial appendage thrombus was detected.  4. The mitral valve is normal in structure. No evidence of mitral valve regurgitation. No evidence of mitral stenosis.  5. The aortic valve is normal in structure. Aortic valve regurgitation is not visualized. No aortic stenosis is present.  6. The inferior vena cava is normal in size with greater than 50% respiratory variability, suggesting right atrial pressure of 3 mmHg.  7. Agitated saline contrast bubble study was negative, with no evidence of any interatrial shunt. Conclusion(s)/Recommendation(s): Normal biventricular function without evidence of hemodynamically significant valvular heart disease. FINDINGS  Left Ventricle: Left ventricular ejection fraction, by estimation, is 60 to 65%. The left ventricle has normal function. The left ventricle has no regional wall motion abnormalities. The left ventricular internal cavity size was normal in size. There is  no left ventricular hypertrophy. Right Ventricle: The right ventricular size is normal. No  increase in right ventricular wall thickness. Right ventricular systolic function is normal. Left Atrium: Left atrial size was normal in size. No left atrial/left atrial appendage thrombus was detected. Right Atrium: Right atrial size was normal in size. Pericardium: There is no evidence of pericardial effusion. Mitral Valve: The mitral valve is normal in structure. No evidence of mitral valve regurgitation. No evidence of mitral valve stenosis. Tricuspid Valve: The tricuspid valve is normal in structure. Tricuspid valve regurgitation is not demonstrated. No evidence of tricuspid stenosis. Aortic Valve: The aortic valve is normal in structure. Aortic valve regurgitation is not visualized. No aortic stenosis is present. Pulmonic Valve: The pulmonic valve was normal in structure. Pulmonic valve regurgitation is not visualized. No evidence of pulmonic stenosis. Aorta: The aortic root is normal in size and structure. Venous: The inferior vena cava is normal in size with greater than 50% respiratory variability, suggesting right atrial pressure of 3 mmHg. IAS/Shunts: There is redundancy of the interatrial septum. No atrial level shunt detected by color flow Doppler. Agitated saline contrast bubble study was negative, with no evidence of any interatrial shunt. There is no evidence of a patent foramen ovale. There is no evidence of an atrial septal defect. Donato Schultz MD Electronically signed by Donato Schultz MD Signature Date/Time: 01/10/2024/12:30:28 PM    Final    EP STUDY Result Date: 01/10/2024 See surgical note for result.   Vitals:   01/10/24 1049 01/10/24 1100 01/10/24 1110 01/10/24 1127  BP: 127/86 133/89 (!) 145/94 119/82  Pulse: 90 82 81 75  Resp: 16 14 17 18   Temp:    97.7 F (36.5 C)  TempSrc:    Oral  SpO2: 99% 98% 99% 100%  Weight:      Height:         PHYSICAL EXAM General:  Alert, well-nourished, well-developed pleasant young Caucasian lady in no acute distress Psych:  Mood and affect  appropriate for situation CV: Regular rate and rhythm on monitor Respiratory:  Regular, unlabored respirations on room air GI: Abdomen soft and nontender   NEURO:  Mental Status: AA&Ox3, patient is able to give clear and coherent history Speech/Language: speech is without aphasia. Mild dysarthria   Cranial Nerves:  II: PERRL. Visual fields full.  III, IV, VI: EOMI. Eyelids elevate symmetrically.  V: Sensation is intact to light touch and symmetrical to face.  VII: Left facial droop VIII: hearing intact to voice. IX, X: Palate elevates symmetrically. Phonation is normal.  UJ:[id number] shrug 5/5. XII: tongue is midline without  fasciculations. Motor: RUE and RLE 5/5 LUE 2/5 elbow flexion and extension. 1/5 deltoid, wrist flexion/extension, finger abduction/extension and grip LLE   4/5 with drift Tone is normal to BLE and RUE, decreased to LUE Bulk is normal  Sensation- Intact to light touch bilaterally. Extinction absent to light touch to DSS.   Coordination: FTN intact on the right, HKS: no ataxia on the righ t Gait- deferred  Most Recent NIH 5     ASSESSMENT/PLAN  Ms. Faith Jordan is a 28 y.o. female with history of migraines admitted for acute onset of left hemiplegia and facial droop.  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 Aspects is 10/10. Dense calcifications within the globus pallidus bilaterally. This is a nonspecific finding but can be seen in the setting of Fahr's disease. CTA head & neck Normal CTA of the neck. Normal CTA Circle of Willis without significant proximal stenosis,  aneurysm, or branch vessel occlusion. MRI 10 mm acute nonhemorrhagic infarct of the posterior limb of the right internal capsule. Susceptibility related calcifications in the globus pallidus bilaterally. This likely reflects the sequela of chronic microvascular ischemia. Vas [place] TCD Bubble negative for right-to-left shunt Vas [place] LE negative for DVT Will need TEE-request sent Hypercoagulable panel negative so far and ANA negative 2D Echo EF 60-65%.  Left atrial size normal LDL 106 HgbA1c 4.9 CRP and Sed Rate normal  VTE prophylaxis - Lovenox No antithrombotic prior to admission, loaded with 300mg  Plavix now on clopidogrel 75 mg daily (allergy to NSAIDs will not start aspirin) Therapy recommendations:  CIR Disposition:  Discharge to CIR after medical work up   Hx of hemiplegic migraines  Follows with Dr. Lucia Gaskins at [facility] 1 headache per week; she estimates about 5% of them to be with left facial weakness only in the absence of headache, about 5% with left facial weakness + headache and about 90% of them with headache pain only Discontinue all medications containing estrogen or estrogen analogues   Hyperlipidemia LDL 106, goal < 70 Add atorvastatin Continue statin at discharge  Other Stroke Risk Factors ETOH use, alcohol level <10, advised to drink no more than 2 drink(s) a day Family hx of migraines  Hospital day # 56     28 year old Caucasian lady with no significant vascular risk factors except being on estrogen-containing birth control pills and mild hyperlipidemia , family history of hemiplegic migraine presented with left-sided weakness and MRI shows 1 cm right internal capsule infarct.  Recommend further evaluation by   hypercoagulable labs and TEE.  Continue cardiac monitoring.   Continue Plavix 75 mg daily.  Statin for elevated lipids.  Transfer to inpatient rehab when bed available.  atient advised to discontinue estrogen containing birth control pills.  Patient also advised to stop  taking Nurtec and Emgality after disc patient with her neurologist Dr. And for her migraines.  She was advised to call Dr. Trevor Mace office to discuss follow-up appointment and consider Botox treatment .she also has interesting finding of bilateral basal ganglia calcification.  Will check iron studies and parathyroid hormone levels.  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  But relationship with stroke  risk is unclear.   Long discussion with the patient and mother at the bedside and answered questions.  Discussed with Dr. Alanda Slim.  Greater than 50% time during this 35 minute visit was spent in counseling and coordination of care about her cryptogenic stroke and discussion about stroke evaluation and prevention and treatment and answering questions. Stroke team will sign off.  Follow-up with an outpatient stroke clinic in 2 months.  Follow-up with Dr. Lucia Gaskins 1 to 2 weeks for migraine follow-up.  Follow-up with a gynecologist to discuss alternatives birth control pills Delia Heady,  Medical Director [facility] Pager: [phone] 01/10/2024 2:13 PM    To contact Stroke Continuity provider, please refer to [email or web address]. After hours, contact General Neurology

## 2024-01-10 NOTE — Anesthesia Preprocedure Evaluation (Signed)
 Anesthesia Evaluation  Patient identified by MRN, date of birth, ID band Patient awake    Reviewed: Allergy & Precautions, NPO status , Patient's Chart, lab work & pertinent test results  Airway Mallampati: III  TM Distance: >3 FB Neck ROM: Full    Dental  (+) Teeth Intact, Dental Advisory Given   Pulmonary asthma    breath sounds clear to auscultation       Cardiovascular + dysrhythmias  Rhythm:Regular Rate:Normal     Neuro/Psych  Headaches PSYCHIATRIC DISORDERS Anxiety      Neuromuscular disease CVA, Residual Symptoms    GI/Hepatic negative GI ROS, Neg liver ROS,,,  Endo/Other  negative endocrine ROS    Renal/GU negative Renal ROS     Musculoskeletal negative musculoskeletal ROS (+)    Abdominal   Peds  Hematology  (+) Blood dyscrasia, anemia   Anesthesia Other Findings   Reproductive/Obstetrics                             Anesthesia Physical Anesthesia Plan  ASA: 3  Anesthesia Plan: MAC   Post-op Pain Management: Minimal or no pain anticipated   Induction: Intravenous  PONV Risk Score and Plan: 0 and Propofol infusion  Airway Management Planned: Natural Airway and Simple Face Mask  Additional Equipment: None  Intra-op Plan:   Post-operative Plan:   Informed Consent: I have reviewed the patients History and Physical, chart, labs and discussed the procedure including the risks, benefits and alternatives for the proposed anesthesia with the patient or authorized representative who has indicated his/her understanding and acceptance.       Plan Discussed with: CRNA  Anesthesia Plan Comments:        Anesthesia Quick Evaluation

## 2024-01-10 NOTE — Progress Notes (Signed)
 PT Cancellation Note  Patient Details Name: DORRENE BENTLY MRN: [id number] DOB: September 15, 1996   Cancelled Treatment:    Reason Eval/Treat Not Completed: Patient at procedure or test/unavailable; off the floor for TEE.  Will follow up.   [name] 01/10/2024, 9:06 AM Sheran Lawless, PT Acute Rehabilitation Services Office:[phone] 01/10/2024

## 2024-01-10 NOTE — Progress Notes (Signed)
 Occupational Therapy Treatment Patient Details Name: Faith Jordan MRN: [id number] DOB: [birth year] Today's Date: 01/10/2024   History of present illness 28 y.o. female presents to [facility] 01/07/24 with L sided weakness, Code Stroke called. 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 PMHx: familial hemiplegic migraine, angioedema 2017, dysautonomia   OT comments  This 28 yo female is showing increased mobility and increased movement in LUE from evaluation 3 days ago. She worked hard with LUE arm movements as well as movement facilitation techniques for LUE. She will continue to benefit from acute OT with follow OT from intensive inpatient follow-up therapy, >3 hours/day.       If plan is discharge home, recommend the following:  A lot of help with walking and/or transfers;A lot of help with bathing/dressing/bathroom;Assistance with cooking/housework;Assist for transportation;Help with stairs or ramp for entrance   Equipment Recommendations  None recommended by OT    Recommendations for Other Services Rehab consult    Precautions / Restrictions Precautions Precautions: Fall Recall of Precautions/Restrictions: Intact Precaution/Restrictions Comments: SBP < 180 Restrictions Weight Bearing Restrictions Per Provider Order: No       Mobility Bed Mobility Overal bed mobility: Needs Assistance Bed Mobility: Supine to Sit     Supine to sit: Supervision     General bed mobility comments: comes up into long sitting then swings around    Transfers                                Extremity/Trunk Assessment Upper Extremity Assessment Upper Extremity Assessment: Right hand dominant;LUE deficits/detail LUE Deficits / Details: Pt continuing to show shoulder shurg, elbow flexion/extension (with weight of arm supported), internal/external rotation ( with weight of arm supported), forearm pronation (with weight of arm  supported), wrist flexion/extension in gravity eliminated (with weight of arm supported), and finger flexion. LUE Sensation: decreased proprioception LUE Coordination: decreased fine motor;decreased gross motor            Vision   Additional Comments: Pt did not report blurry vision today, but I did not directly ask her         Communication Communication Communication: No apparent difficulties   Cognition Arousal: Alert Behavior During Therapy: WFL for tasks assessed/performed Cognition: No apparent impairments                               Following commands: Intact        Cueing   Cueing Techniques: Verbal cues  Exercises Other Exercises Other Exercises: Supine HOB up pt has movements as mentioned in Extremity section, can get ~4 reps of each movement before arm fatgiues and weight of arm has to be supported. She is aware she is trying to use accessories muscles at times to try and get the movements she wants and is workging towards isolating the movements.Sitting EOB and interlocking fingers to then raise arms up together       General Comments In sittng I had her work on [employer] through LUE (long arm) with partial sit>stand but arm not coming off the bed, lateral leans on left forearm pushing up with arm to come back to midline, and right lateral forearm lean coming back to midline with trunk muscles only    Pertinent Vitals/ Pain       Pain Assessment Pain Assessment: No/denies pain  Frequency  Min 3X/week        Progress Toward Goals  OT Goals(current goals can now be found in the care plan section)  Progress towards OT goals: Progressing toward goals  Acute Rehab OT Goals Patient Stated Goal: for her left arm/hand to work OT Goal Formulation: With patient Time For Goal Achievement: 01/22/24 Potential to Achieve Goals: Good         End of Session    OT Visit Diagnosis: Other abnormalities of gait and mobility  (R26.89);Hemiplegia and hemiparesis Hemiplegia - Right/Left: Left Hemiplegia - dominant/non-dominant: Non-Dominant Hemiplegia - caused by: Cerebral infarction   Activity Tolerance Patient tolerated treatment well   Patient Left  (sitting EOB waiting on PT)   Nurse Communication  (pt labile today during session (encouraged her to cry), pt should be getting a resting hand splint for LUE to wear at night.--per secure chat)        Time: 1610-9604 OT Time Calculation (min): 34 min  Charges: OT General Charges $OT Visit: 1 Visit OT Treatments $Therapeutic Exercise: 23-37 mins  Lindon Romp OT Acute Rehabilitation Services Office [phone]    Evette Georges 01/10/2024, 6:46 PM

## 2024-01-10 NOTE — PMR Pre-admission (Signed)
PMR Admission Coordinator Pre-Admission Assessment  Patient: Faith Jordan is an 28 y.o., female MRN: [id number] DOB: June 30, 1996 Height: 5\' 7"  (170.2 cm) Weight: 77.1 kg  Insurance Information HMO:     PPO: yes      PCP:      IPA:      80/20:      OTHER:  PRIMARY:  Gretta Began PPO      Policy#: [id number], group: [id number]      Subscriber: patient CM Name: Vickey Huger      Phone#: [phone]      Fax#: [phone] Pre-Cert#: [id number] Received insurance authorization on 01/11/24. Pt approved from 01/11/24-01/21/24. Pt approved for 11 days. Updates due on 01/22/24.    Employer:  Benefits:  Phone #: [phone]     Name:  Dolores Hoose Date: 10/19/2023 - still active Deductible: $300 ($106.05 met) OOP Max: $7,900 ($161.05 met) CIR: 80% coverage; 20% co-insurance SNF: 80% coverage; 20% co-insurance Outpatient: 80% coverage; 20% co-insurance Home Health:  80% coverage; 20% co-insurance DME: 80% coverage; 20% co-insurance Providers: in network   SECONDARY: None      Policy#:      Phone#:    [phone].  Clinicals uploaded at 1:35pm.  Preciate it!     ** Submitted as urgent to see if it decreases turnaround time/request for next day admit**     Benefits: Kimara, Bencomo- DOB [birth year]  Insurance: [place] [employer] PPO  Plan Type: Open Access Select (Traditional Plan)  EPO: YES         Policy #: [id number], group: [id number] Fax: [phone] Phone: [phone]       Financial Counselor:       Phone#:   The "Data Collection Information Summary" for patients in Inpatient Rehabilitation Facilities with attached "Privacy Act Statement-Health Care Records" was provided and verbally reviewed with: N/A  Emergency Contact Information Contact Information     Name Relation Home Work Mobile   Ratterman,Tammy Mother [phone]  [phone]   Roussin,Dwight Father [phone]        Other Contacts   None on File     Current Medical History  Patient Admitting  Diagnosis: CVA History of Present Illness: MAFALDA Jordan is a 28 y.o. 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  Last known well at 8:00 PM on 01/06/2024.  She initially thought her left-sided weakness were related to her hemiplegic migraine.  However, they usually involve her face only and not other parts of her body.  She took her migraine medications, but her hemiaplasia persisted so she came to the ED. In the ER, noncontrast head CT showed no acute intracranial abnormality, dense calcification within the globus pallidus bilaterally, nonspecific finding that can be seen in the setting of Fahr's disease.  CT angio head and neck showing no evidence of LVO.  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 CTA head & neck Normal CTA of the neck. Normal CTA Circle of Willis without significant proximal stenosis, aneurysm, or branch vessel occlusion.as [place] TCD Bubble negative for right-to-left shunt, Vas [place] LE negative for DVT, Hypercoagulable panel negative  so far and ANA negative 2D Echo EF 60-65%.  Left atrial size normal. TEE normal. Pt. Seen by PT/OT and they recommended CIR to to assist return to PLOF.      Complete NIHSS TOTAL: 6  Patient's medical record from [facility] has been reviewed by the rehabilitation admission coordinator and physician.  Past Medical History  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   Acute flank pain  06/02/2022   Angio-edema 04/27/2016   Angioedema    Aquagenic angio-edema-urticaria    Asthma    no problems /not used recently   Dysautonomia [facility])    Dysrhythmia    sinus tachycardia   Fibroid    right breast, adenoma   Headache(784.0)    History of COVID-19 11/21/2020   Hives 12/22/2015   Pneumonia    hx  6th grade   Sensation of fullness in both ears 02/02/2023   Snoring 02/11/2022   Urinary frequency 02/12/2019   Vaginal yeast infection 06/02/2022   Vasculitis (HCC)     Has the patient had major surgery during 100 days prior to admission? No  Family History   family history includes Asthma in an other family member; Breast cancer (age of onset: 61) in her maternal grandmother; Cancer in an other family member; Depression in her mother; Epilepsy in an other family member; Hypothyroidism in her mother; Migraines in her mother; Myasthenia gravis in her maternal grandfather; Narcolepsy in her mother.  Current Medications  Current Facility-Administered Medications:    acetaminophen (TYLENOL) tablet 650 mg, 650 mg, Oral, Q6H PRN, Darlin Drop, DO, 650 mg at 01/10/24 0514   atorvastatin (LIPITOR) tablet 80 mg, 80 mg, Oral, Daily, Alanda Slim, Taye T, MD, 80 mg at 01/10/24 1143   clopidogrel (PLAVIX) tablet 75 mg, 75 mg, Oral, Daily, Caryl Pina, MD, 75 mg at 01/10/24 1143   enoxaparin (LOVENOX) injection 40 mg, 40 mg, Subcutaneous, Daily, Hall, Carole N, DO, 40 mg at 01/10/24 1143   FLUoxetine (PROZAC) capsule 40 mg, 40 mg, Oral, Daily, Alanda Slim, Taye T, MD, 40 mg at 01/10/24 1142   labetalol (NORMODYNE) injection 5 mg, 5 mg, Intravenous, Q2H PRN, Hall, Carole N, DO   lidocaine (LIDODERM) 5 % 1 patch, 1 patch, Transdermal, Q24H, Howerter, Justin B, DO, 1 patch at 01/10/24 0518   loperamide (IMODIUM) capsule 2 mg, 2 mg, Oral, PRN, Candelaria Stagers T, MD, 2 mg at 01/08/24 1307   LORazepam (ATIVAN) tablet 1 mg, 1 mg, Oral, Q6H PRN, Alanda Slim, Taye T, MD, 1 mg at 01/08/24 1408   melatonin tablet  5 mg, 5 mg, Oral, QHS, Gonfa, Taye T, MD, 5 mg at 01/08/24 2036   polyethylene glycol (MIRALAX / GLYCOLAX) packet 17 g, 17 g, Oral, Daily PRN, Hall, Carole N, DO   prochlorperazine (COMPAZINE) injection 5 mg, 5 mg, Intravenous, Q6H PRN, Hall, Carole N, DO   sodium bicarbonate tablet 650 mg, 650 mg, Oral, TID, Alanda Slim, Taye T, MD, 650 mg at 01/10/24 1142   topiramate (TOPAMAX) tablet 50 mg, 50 mg, Oral, Daily, 50 mg at 01/10/24 1144 **AND** topiramate (TOPAMAX) tablet 50 mg, 50 mg, Oral, Daily PRN, Alanda Slim, Taye T, MD   traZODone (DESYREL) tablet 100 mg, 100 mg, Oral, QHS PRN, Alanda Slim, Taye T, MD, 100 mg at 01/09/24 2020  Patients Current Diet:  Diet Order             Diet regular Room service appropriate? Yes; Fluid consistency: Thin  Diet effective now                   Precautions / Restrictions Precautions Precautions: Fall Precaution/Restrictions Comments: SBP < 180 Restrictions Weight Bearing Restrictions Per Provider Order: No   Has the patient had 2 or more falls or a fall with injury in the past year? No  Prior Activity Level Community (5-7x/wk): Pt active in the community, working  Prior Functional Level Self Care: Did the patient need help bathing, dressing, using the toilet or eating? Independent  Indoor Mobility: Did the patient need assistance with walking from room to room (with or without device)? Independent  Stairs: Did the patient need assistance with internal or external stairs (with or without device)? Independent  Functional Cognition: Did the patient need help planning regular tasks such as shopping or remembering to take medications? Independent  Patient Information Are you of Hispanic, Latino/a,or Spanish origin?: A. No, not of Hispanic, Latino/a, or Spanish origin What is your race?: A. White Do you need or want an interpreter to communicate with a doctor or health care staff?: 0. No  Patient's Response To:  Health Literacy and Transportation Is the  patient able to respond to health literacy and transportation needs?: Yes Health Literacy - How often do you need to have someone help you when you read instructions, pamphlets, or other written material from your doctor or pharmacy?: Never In the past 12 months, has lack of transportation kept you from medical appointments or from getting medications?: No In the past 12 months, has lack of transportation kept you from meetings, work, or from getting things needed for daily living?: No  Journalist, newspaper / Equipment Home Equipment: BSC/3in1, Shower seat  Prior Device Use: Indicate devices/aids used by the patient prior to current illness, exacerbation or injury? None of the above  Current Functional Level Cognition  Arousal/Alertness: Awake/alert Overall Cognitive Status: Impaired/Different from baseline (mild higher level) Orientation Level: Oriented X4 Attention: Sustained Sustained Attention: Appears intact Memory: Appears intact Awareness: Appears intact Problem Solving: Impaired (clock drawing) Problem Solving Impairment: Functional basic Behaviors: Other (comment) (tearful) Safety/Judgment: Appears intact    Extremity Assessment (includes Sensation/Coordination)  Upper Extremity Assessment: Right hand dominant, LUE deficits/detail LUE Deficits / Details: pt has shrug and elbow flexion/ extension ( bicep and tricep)  no activation of wrist or digits at this time. pt provided weight bearing to help with neuro recovery. LUE Sensation: decreased light touch, decreased proprioception LUE Coordination: decreased fine motor, decreased gross motor  Lower Extremity Assessment: Defer to PT evaluation RLE Deficits / Details: WFL RLE Sensation: WNL LLE Deficits / Details: noted to buckle LLE Sensation: WNL LLE Coordination: decreased gross motor    ADLs  Overall ADL's : Needs assistance/impaired Eating/Feeding: Supervision/ safety Eating/Feeding Details (indicate cue type and  reason): cues to make sure that she sweeps L side of mouth with tonge. pt taking small appropriate bites and self feeding at end of session. Grooming: Wash/dry hands, Modified independent Toilet Transfer: Minimal assistance General ADL Comments: pt able to take several steps away from bed with OT blocking LLE    Mobility  Overal bed mobility: Needs Assistance Bed Mobility: Supine to Sit, Sit to Supine Supine to sit: Supervision Sit to supine: Supervision General bed mobility comments: supervision for safety on stretcher    Transfers  Overall transfer level: Needs assistance Equipment used: Left platform walker Transfers: Sit to/from Stand Sit to Stand: Contact guard assist, Min assist Bed to/from chair/wheelchair/BSC transfer type:: Step  pivot Step pivot transfers: Min assist General transfer comment: CGA mostly however MinA to block L LE due to occasional buckling once fatigued.    Ambulation / Gait / Stairs / Wheelchair Mobility  Ambulation/Gait Ambulation/Gait assistance: Min assist, Mod assist, +2 physical assistance, +2 safety/equipment Gait Distance (Feet): 120 Feet [phone]) Assistive device: Left platform walker Gait Pattern/deviations: Ataxic, Narrow base of support, Decreased stride length, Step-through pattern, Knees buckling, Scissoring, Drifts right/left General Gait Details: Pt able to complete multiple trials of ambulation in hallway with Min/Mod A. Mod A once fatigued. Close chair follow provided with patient requiring 4 seated rest breaks. Increased L knee buckling noted with fatigue however no overt LOB. Gait velocity: decreased    Posture / Balance Dynamic Sitting Balance Sitting balance - Comments: pt states its harder to sit up than normal Balance Overall balance assessment: Needs assistance Sitting-balance support: Feet supported, Single extremity supported Sitting balance-Leahy Scale: Fair Sitting balance - Comments: pt states its harder to sit up than  normal Standing balance support: Single extremity supported, During functional activity, Reliant on assistive device for balance Standing balance-Leahy Scale: Poor Standing balance comment: reliant on platform RW    Special needs/care consideration Skin none   Previous Home Environment (from acute therapy documentation) Living Arrangements: Alone  Lives With: Alone Available Help at Discharge: Family, Available 24 hours/day Type of Home: House Home Layout: Multi-level, Able to live on main level with bedroom/bathroom Home Access: Stairs to enter Entrance Stairs-Rails: Right, Left Entrance Stairs-Number of Steps: 3 Bathroom Shower/Tub: Health visitor: Pharmacist, community: Yes Home Care Services: No Additional Comments: History for mother's house where she would discharge from the hospital. pt lives alone with her dog "Letti"  Discharge Living Setting Plans for Discharge Living Setting: House Type of Home at Discharge: House Discharge Home Layout: Multi-level, Able to live on main level with bedroom/bathroom Alternate Level Stairs-Rails: Can reach both Alternate Level Stairs-Number of Steps: 4+2 Discharge Home Access: Stairs to enter Entrance Stairs-Rails: Right Entrance Stairs-Number of Steps: flight Discharge Bathroom Shower/Tub: Walk-in shower Discharge Bathroom Toilet: Standard Discharge Bathroom Accessibility: Yes How Accessible: Accessible via walker, Accessible via wheelchair Does the patient have any problems obtaining your medications?: No  Social/Family/Support Systems Patient Roles: Parent Contact Information: [phone] Anticipated Caregiver: Tammy Derk Anticipated Caregiver's Contact Information: Pt.'s mother can provide 24/7 Caregiver Availability: 24/7 Discharge Plan Discussed with Primary Caregiver: Yes Is Caregiver In Agreement with Plan?: Yes Does Caregiver/Family have Issues with Lodging/Transportation while Pt is in  Rehab?: No  Goals Patient/Family Goal for Rehab: PT/OT/SLP Supervision Expected length of stay: 12-14 days Pt/Family Agrees to Admission and willing to participate: Yes Program Orientation Provided & Reviewed with Pt/Caregiver Including Roles  & Responsibilities: Yes  Decrease burden of Care through IP rehab admission: not anticipated  Possible need for SNF placement upon discharge: not anticipated   Patient Condition: I have reviewed medical records from [facility], spoken with CM, and patient and family member. I met with patient at the bedside for inpatient rehabilitation assessment.  Patient will benefit from ongoing PT, OT, and SLP, can actively participate in 3 hours of therapy a day 5 days of the week, and can make measurable gains during the admission.  Patient will also benefit from the coordinated team approach during an Inpatient Acute Rehabilitation admission.  The patient will receive intensive therapy as well as Rehabilitation physician, nursing, social worker, and care management interventions.  Due to safety, skin/wound care, disease management, medication administration, pain  management, and patient education the patient requires 24 hour a day rehabilitation nursing.  The patient is currently Min-Max A with mobility and basic ADLs.  Discharge setting and therapy post discharge at home with home health is anticipated.  Patient has agreed to participate in the Acute Inpatient Rehabilitation Program and will admit today.  Preadmission Screen Completed By:  Jeronimo Greaves, 01/10/2024 3:11 PM ______________________________________________________________________   Discussed status with Dr. Berline Chough on 01/12/24 at 10:17 AM and received approval for admission today.  Admission Coordinator:  Jeronimo Greaves, CCC-SLP, time 10:18 AM/Date 01/12/24    Assessment/Plan: Diagnosis: R Basal ganglia infarct Does the need for close, 24 hr/day Medical supervision in concert with  the patient's rehab needs make it unreasonable for this patient to be served in a less intensive setting? Yes Co-Morbidities requiring supervision/potential complications: L hemiparesis, L facial droop- dysphagia; migraines; dysautonomia, Chronic LBP Due to bladder management, bowel management, safety, skin/wound care, disease management, medication administration, pain management, and patient education, does the patient require 24 hr/day rehab nursing? Yes Does the patient require coordinated care of a physician, rehab nurse, PT, OT, and SLP to address physical and functional deficits in the context of the above medical diagnosis(es)? Yes Addressing deficits in the following areas: balance, endurance, locomotion, strength, transferring, bowel/bladder control, bathing, dressing, feeding, grooming, toileting, and speech Can the patient actively participate in an intensive therapy program of at least 3 hrs of therapy 5 days a week? Yes The potential for patient to make measurable gains while on inpatient rehab is good Anticipated functional outcomes upon discharge from inpatient rehab: supervision PT, supervision OT, supervision SLP Estimated rehab length of stay to reach the above functional goals is: 12-14 days Anticipated discharge destination: Home 10. Overall Rehab/Functional Prognosis: good   MD Signature:

## 2024-01-10 NOTE — Progress Notes (Signed)
 Physical Therapy Treatment Patient Details Name: Faith Jordan MRN: [id number] DOB: 1996-04-30 Today's Date: 01/10/2024   History of Present Illness 28 y.o. female presents to [facility] 01/07/24 with L sided weakness, Code Stroke called. 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 PMHx: familial hemiplegic migraine, angioedema 2017, dysautonomia    PT Comments  Patient progressing with mobility and ambulation in hallway.  With pre-gait activity in hallway leg more fatigued and needing slightly more help (mod A) with ambulation back to room despite some seated rest breaks.  She is eager to work and participate despite already had OT and TEE this am.  She shows ability to tolerate intensity of rehab prior to d/c home.  PT will continue to follow.   If plan is discharge home, recommend the following: A lot of help with walking and/or transfers;A lot of help with bathing/dressing/bathroom;Assistance with cooking/housework;Assist for transportation;Help with stairs or ramp for entrance   Can travel by private vehicle        Equipment Recommendations  Wheelchair (measurements PT);Wheelchair cushion (measurements PT);Other (comment)    Recommendations for Other Services       Precautions / Restrictions Precautions Precautions: Fall Recall of Precautions/Restrictions: Intact Precaution/Restrictions Comments: SBP < 180     Mobility  Bed Mobility Overal bed mobility: Needs Assistance Bed Mobility: Supine to Sit, Sit to Supine     Supine to sit: Supervision Sit to supine: Supervision   General bed mobility comments: assist for safety though pt without LOB    Transfers Overall transfer level: Needs assistance Equipment used: Left platform walker Transfers: Sit to/from Stand Sit to Stand: Contact guard assist           General transfer comment: cues for managing L UE during stand to sit especially; assist for balance     Ambulation/Gait Ambulation/Gait assistance: Mod assist, Min assist Gait Distance (Feet): 70 Feet (& 40') Assistive device: Left platform walker Gait Pattern/deviations: Decreased stride length, Decreased stance time - left, Trendelenburg     Pre-gait activities: at rail in hallway working on L hip flexion to initiate swing and through initial contact and bringing back in reverse for strengthening and sequencing/timing General Gait Details: L knee recurvatum in terminal stance, circumduction with heel whip to clear the foot; progressive fatigue with increased assistance needed and pt needing A for L UE on and off platform   Stairs             Wheelchair Mobility     Tilt Bed    Modified Rankin (Stroke Patients Only) Modified Rankin (Stroke Patients Only) Pre-Morbid Rankin Score: No symptoms Modified Rankin: Moderately severe disability     Balance Overall balance assessment: Needs assistance   Sitting balance-Leahy Scale: Good Sitting balance - Comments: on EOB no LOB when getting up to sit despite L UE weakness   Standing balance support: Bilateral upper extremity supported Standing balance-Leahy Scale: Poor Standing balance comment: holding on with L on platform while performing clothing management in bathroom and washing R hand at sink with CGA                            Communication    Cognition Arousal: Alert Behavior During Therapy: WFL for tasks assessed/performed, Flat affect   PT - Cognitive impairments: No apparent impairments  Following commands: Intact      Cueing Cueing Techniques: Verbal cues  Exercises      General Comments General comments (skin integrity, edema, etc.): Toileted in bathroom prior to return to supine      Pertinent Vitals/Pain Pain Assessment Pain Assessment: No/denies pain    Home Living                          Prior Function            PT Goals (current  goals can now be found in the care plan section) Progress towards PT goals: Progressing toward goals    Frequency    Min 3X/week      PT Plan      Co-evaluation              AM-PAC PT "6 Clicks" Mobility   Outcome Measure  Help needed turning from your back to your side while in a flat bed without using bedrails?: None Help needed moving from lying on your back to sitting on the side of a flat bed without using bedrails?: A Little Help needed moving to and from a bed to a chair (including a wheelchair)?: A Little Help needed standing up from a chair using your arms (e.g., wheelchair or bedside chair)?: A Little Help needed to walk in hospital room?: A Little Help needed climbing 3-5 steps with a railing? : Total 6 Click Score: 17    End of Session Equipment Utilized During Treatment: Gait belt Activity Tolerance: Patient tolerated treatment well Patient left: in bed;with call bell/phone within reach;with bed alarm set   PT Visit Diagnosis: Unsteadiness on feet (R26.81);Other abnormalities of gait and mobility (R26.89);Muscle weakness (generalized) (M62.81);Hemiplegia and hemiparesis Hemiplegia - Right/Left: Left Hemiplegia - dominant/non-dominant: Non-dominant Hemiplegia - caused by: Cerebral infarction     Time: 1512-1550 PT Time Calculation (min) (ACUTE ONLY): 38 min  Charges:    $Gait Training: 23-37 mins $Neuromuscular Re-education: 8-22 mins PT General Charges $$ ACUTE PT VISIT: 1 Visit                     Sheran Lawless, PT Acute Rehabilitation Services Office:[phone] 01/10/2024    [name] 01/10/2024, 5:30 PM

## 2024-01-10 NOTE — Progress Notes (Signed)
 [facility] night cross cover note:   Per patient's request, I ordered lidocaine patch for her low back pain.    Newton Pigg, DO Hospitalist

## 2024-01-10 NOTE — Anesthesia Postprocedure Evaluation (Signed)
 Anesthesia Post Note  Patient: CYLAH FANNIN  Procedure(s) Performed: TRANSESOPHAGEAL ECHOCARDIOGRAM     Patient location during evaluation: PACU Anesthesia Type: MAC Level of consciousness: awake and alert Pain management: pain level controlled Vital Signs Assessment: post-procedure vital signs reviewed and stable Respiratory status: spontaneous breathing, nonlabored ventilation, respiratory function stable and patient connected to nasal cannula oxygen Cardiovascular status: stable and blood pressure returned to baseline Postop Assessment: no apparent nausea or vomiting Anesthetic complications: no  No notable events documented.  Last Vitals:  Vitals:   01/10/24 1110 01/10/24 1127  BP: (!) 145/94 119/82  Pulse: 81 75  Resp: 17 18  Temp:  36.5 C  SpO2: 99% 100%    Last Pain:  Vitals:   01/10/24 1127  TempSrc: Oral  PainSc: 2                  Shelton Silvas

## 2024-01-10 NOTE — Plan of Care (Signed)
  Problem: Education: Goal: Knowledge of disease or condition will improve Outcome: Progressing Goal: Knowledge of secondary prevention will improve (MUST DOCUMENT ALL) Outcome: Progressing Goal: Knowledge of patient specific risk factors will improve (DELETE if not current risk factor) Outcome: Progressing   Problem: Ischemic Stroke/TIA Tissue Perfusion: Goal: Complications of ischemic stroke/TIA will be minimized Outcome: Progressing   Problem: Coping: Goal: Will verbalize positive feelings about self Outcome: Progressing Goal: Will identify appropriate support needs Outcome: Progressing   Problem: Health Behavior/Discharge Planning: Goal: Ability to manage health-related needs will improve Outcome: Progressing Goal: Goals will be collaboratively established with patient/family Outcome: Progressing   Problem: Self-Care: Goal: Ability to participate in self-care as condition permits will improve Outcome: Progressing Goal: Verbalization of feelings and concerns over difficulty with self-care will improve Outcome: Progressing Goal: Ability to communicate needs accurately will improve Outcome: Progressing   Problem: Nutrition: Goal: Risk of aspiration will decrease Outcome: Progressing Goal: Dietary intake will improve Outcome: Progressing   Problem: Education: Goal: Knowledge of disease or condition will improve Outcome: Progressing Goal: Knowledge of secondary prevention will improve (MUST DOCUMENT ALL) Outcome: Progressing Goal: Knowledge of patient specific risk factors will improve (DELETE if not current risk factor) Outcome: Progressing   Problem: Ischemic Stroke/TIA Tissue Perfusion: Goal: Complications of ischemic stroke/TIA will be minimized Outcome: Progressing   Problem: Health Behavior/Discharge Planning: Goal: Ability to manage health-related needs will improve Outcome: Progressing Goal: Goals will be collaboratively established with  patient/family Outcome: Progressing   Problem: Self-Care: Goal: Verbalization of feelings and concerns over difficulty with self-care will improve Outcome: Progressing

## 2024-01-10 NOTE — CV Procedure (Signed)
   Transesophageal Echocardiogram  Indications:stroke  Time out performed  During this procedure the patient was administered propofol under anesthesiology supervision to achieve and maintain moderate sedation.  The patient's heart rate, blood pressure, and oxygen saturation are monitored continuously during the procedure.   Findings:  Left Ventricle: Normal EF 65%  Mitral Valve: Normal  Aortic Valve: Normal  Tricuspid Valve: Normal  Left Atrium: Normal, no left atrial appendage thrombus  Right Atrium: Normal  Intraatrial septum: Normal, no shunt  Bubble Contrast Study: Normal no shunt  Donato Schultz, MD

## 2024-01-10 NOTE — Progress Notes (Signed)
 Orthopedic Tech Progress Note Patient Details:  Faith Jordan 07-Apr-1996 [id number]  Called in order to [employer] for a RESTING HAND SPLINT  Patient ID: Faith Jordan, female   DOB: July 22, 1996, 28 y.o.   MRN: [id number]  Donald Pore 01/10/2024, 5:17 PM

## 2024-01-11 ENCOUNTER — Encounter (HOSPITAL_COMMUNITY): Payer: Self-pay | Admitting: Cardiology

## 2024-01-11 DIAGNOSIS — I639 Cerebral infarction, unspecified: Secondary | ICD-10-CM | POA: Diagnosis not present

## 2024-01-11 LAB — PROTEIN C, TOTAL: Protein C, Total: 108 % (ref 60–150)

## 2024-01-11 NOTE — Progress Notes (Signed)
 Occupational Therapy Treatment Patient Details Name: JATZIRY WECHTER MRN: [id number] DOB: 09-May-1996 Today's Date: 01/11/2024   History of present illness 28 y.o. female presents to [facility] 01/07/24 with L sided weakness, Code Stroke called. 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 PMHx: familial hemiplegic migraine, angioedema 2017, dysautonomia   OT comments  [name] is trying really hard, trying anything is asked of her and is really motivated to do rehab and get better. She is working hard to get movement back in her LUE to a functional level without using compensatory movements/strategies to do so. She will continue to benefit from acute OT with follow up from intensive inpatient follow-up therapy, >3 hours/day.        If plan is discharge home, recommend the following:  A lot of help with walking and/or transfers;A lot of help with bathing/dressing/bathroom;Assistance with cooking/housework;Assist for transportation;Help with stairs or ramp for entrance   Equipment Recommendations  None recommended by OT    Recommendations for Other Services Rehab consult    Precautions / Restrictions Precautions Precautions: Fall Recall of Precautions/Restrictions: Intact Precaution/Restrictions Comments: SBP < 180 Restrictions Weight Bearing Restrictions Per Provider Order: No       Mobility Bed Mobility Overal bed mobility: Modified Independent Bed Mobility: Supine to Sit                Transfers Overall transfer level: Needs assistance Equipment used: None Transfers: Sit to/from Stand Sit to Stand: Min assist                 Balance Overall balance assessment: Needs assistance Sitting-balance support: No upper extremity supported, Feet supported Sitting balance-Leahy Scale: Good                                     ADL either performed or assessed with clinical judgement   ADL Overall ADL's :  Needs assistance/impaired                       Lower Body Dressing Details (indicate cue type and reason): Worked on crossing one leg over the other and taking off/putting on socks. Able to maintain balance at EOB while doing this, needed A of RUE to cross LLE in "figure 4" fashion and this is currently not able to maintain "figure 4" position on her own with LLE. Held her LLE for her in this position and A'd her with taking sock off and putting back on with hand over hand A for LUE (she can close hand--composite flexion; but cannot maintain grip) as she used both hands to put sock back on--did this also with RLE sock.                    Extremity/Trunk Assessment Upper Extremity Assessment Upper Extremity Assessment: Right hand dominant;LUE deficits/detail LUE Deficits / Details: Pt wearing splint on LUE when I entered (to be worn at night only)--pt able to take off and put back on LUE resting hand splint (this keeps her fingers from curling up at night). Still showing the movements above. No issues noted with splint wearing LUE Sensation: decreased proprioception LUE Coordination: decreased fine motor;decreased gross motor            Vision Baseline Vision/History: 0 No visual deficits Ability to See in Adequate Light: 1 Impaired Patient Visual Report: Blurring of vision  Vision Assessment?: Yes Eye Alignment: Within Functional Limits Ocular Range of Motion: Within Functional Limits Alignment/Gaze Preference: Within Defined Limits Tracking/Visual Pursuits: Able to track stimulus in all quads without difficulty Saccades: Within functional limits Convergence: Within functional limits Visual Fields: No apparent deficits Additional Comments: Pt reports vision still blurry but note as bad as it was--can read signs on the wall now, but blurry. Can reach out and touch my finger in all planes without missing it         Communication Communication Communication: No apparent  difficulties   Cognition Arousal: Alert Behavior During Therapy: WFL for tasks assessed/performed Cognition: No apparent impairments                               Following commands: Intact        Cueing   Cueing Techniques: Verbal cues  Exercises Other Exercises Other Exercises: While seated EOB had her work on placing her left hand on chair in front of her (washcloth under it) and sliding her hand forward and back in chair seat, then placing her RUE on top of her LUE to A going forward to the right, back, then forward to the left (harder on left due to abduction and external rotation harder than adduction and internal rotation. Also worked on straight arms on the chair in front of her and coming up to stand in bent over posture--but weight through both UEs. Worked on partial sit<>stand at EOB with her arms on mine as a platform for both.            Pertinent Vitals/ Pain       Pain Assessment Pain Assessment: No/denies pain         Frequency  Min 3X/week        Progress Toward Goals  OT Goals(current goals can now be found in the care plan section)  Progress towards OT goals: Progressing toward goals  Acute Rehab OT Goals Patient Stated Goal: for left arm/hand to work OT Goal Formulation: With patient Time For Goal Achievement: 01/22/24 Potential to Achieve Goals: Good         AM-PAC OT "6 Clicks" Daily Activity     Outcome Measure   Help from another person eating meals?: A Little   Help from another person toileting, which includes using toliet, bedpan, or urinal?: A Lot Help from another person bathing (including washing, rinsing, drying)?: A Lot Help from another person to put on and taking off regular upper body clothing?: A Lot Help from another person to put on and taking off regular lower body clothing?: A Lot 6 Click Score: 11    End of Session    OT Visit Diagnosis: Unsteadiness on feet (R26.81);Other abnormalities of gait and  mobility (R26.89);Hemiplegia and hemiparesis Hemiplegia - Right/Left: Left Hemiplegia - dominant/non-dominant: Non-Dominant Hemiplegia - caused by: Cerebral infarction   Activity Tolerance Patient tolerated treatment well   Patient Left in bed           Time: 1610-9604 OT Time Calculation (min): 29 min  Charges: OT General Charges $OT Visit: 1 Visit OT Treatments $Self Care/Home Management : 8-22 mins $Neuromuscular Re-education: 8-22 mins  Lindon Romp OT Acute Rehabilitation Services Office [phone]    Evette Georges 01/11/2024, 11:51 AM

## 2024-01-11 NOTE — Progress Notes (Signed)
 PROGRESS NOTE  Faith Jordan YQM:[id number] DOB: 28-Mar-1996   PCP: Doreene Nest, NP  Patient is from: Home.  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    She initially thought her left-sided weakness were related to her hemiplegic migraine.  However, they usually involve her face and not other parts of her body.  She took her migraine medications but her hemiaplasia persisted which prompted her to come to ER.  Patient is on OCP for birth control.  In ED, code stroke activated.  Evaluated by neurology. Last known well at 8:00 PM on 01/06/2024.  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  CT angio head and neck without significant finding.  Noncontrast MRI brain revealed 10 mm acute nonhemorrhagic infarct of the posterior limb of the right internal capsule, susceptibility related to calcification in globus pallidus bilaterally.  Patient was loaded with Plavix, and admitted for further evaluation.  TTE, TEE, TCD and lower extremity venous Doppler negative.  Therapy recommended CIR. medically stable for discharge.    Subjective: Seen and examined earlier this afternoon after she returned from TEE.  No major events overnight of this morning.  New complaints other than left hand and arm weakness.  Objective: Vitals:   01/10/24 2331 01/11/24 0348 01/11/24 0817 01/11/24 1210  BP: 119/63 (!) 100/56 111/72 123/75  Pulse: (!) 103 82 83 (!) 108  Resp: 18 18  18   Temp: 98.1 F (36.7 C) 98.4 F (36.9  C) 98.5 F (36.9 C) 98.5 F (36.9 C)  TempSrc: Oral Oral Oral Oral  SpO2: 100% 98% 99% 96%  Weight:      Height:        Examination:  GENERAL: No apparent distress.  Nontoxic. HEENT: MMM.  Vision and hearing grossly intact.  NECK: Supple.  No apparent JVD.  RESP:  No IWOB.  Fair aeration bilaterally. CVS:  RRR. Heart sounds normal.  ABD/GI/GU: BS+. Abd soft, NTND.  [name]/EXT:  Moves extremities. No apparent deformity. No edema.  SKIN: no apparent skin lesion or wound NEURO: Awake and alert.  Oriented x 4.  Speaks clearly.  Able to lift her left arm off bed.  Difficult to make fist due to weakness.  4/5 in LLE.  5/5 in right arm and leg.  Light sensation intact in all dermatomes.   PSYCH: Anxious and tearful.  Consultants:  Neurology  Procedures: 01/10/2024-TEE negative.  Microbiology summarized: None  Assessment and plan: Acute right basal ganglia infarct: Presents with left hemiparesis and left facial droop that goes along with MRI finding.  No history of diabetes, hypertension or tobacco use.  History of familial hemiplegic migraine.  Also on OCP for birth control.  CT angio head and neck, TTE, TEE, TCD and LE venous Doppler negative.  A1c 4.9%.  LDL 106.  CRP and ESR within normal.  Hypercoagulable labs negative so far.  Loaded with Plavix 300 mg in ED.  Patient with history of angioedema due to NSAID. -Appreciate input by neurology -Continue Plavix and Lipitor.  Not on aspirin due to NSAID allergy. -  Discontinue OCP -Follow the rest of hypercoagulable labs -PT/OT/SLP-recommended CIR. insurance authorization pending.  Abnormal CT head/MRI brain: Nonspecific dense calcification within globus pallidus bilaterally.  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  Patient has no symptoms of Fahr's disease other than anxiety -Defer to neurology  History of familial hemiplegic migraine: Multiple family members including mother, maternal aunt, and maternal cousin  have this diagnosis as well.  -Neurology recommended discontinuing Nurtec and Emgality after discussion with patient's primary neurologist. Dr. Lucia Gaskins at [facility] -Outpatient follow-up with neurology.   History of anxiety/panic attack/sleep disorder/insomnia -Continue home Prozac and trazodone -Ativan 1 mg 3 times daily as needed anxiety.  Binocular blurry vision: Not consistent with a CVA distribution.  Has glasses that she rarely wears.  -Outpatient follow-up with ophthalmology if it does not improve   Muscle spasms, acute: Seems to have resolved.  Hypokalemia:  -Monitor replenish as appropriate  Hyperlipidemia: LDL 106.  Goal less than 70 -Continue Lipitor  Body mass index is 26.63 kg/m.           DVT prophylaxis:  enoxaparin (LOVENOX) injection 40 mg Start: 01/08/24 1000  Code Status: Full code Family Communication: Updated patient sister at bedside Level of care: Telemetry Medical Status is: Inpatient Remains inpatient appropriate because: Acute CVA   Final disposition: CIR   35 minutes with more than 50% spent in reviewing records, counseling patient/family and coordinating care.   Sch Meds:  Scheduled Meds:  atorvastatin  80 mg Oral Daily   clopidogrel  75 mg Oral Daily   enoxaparin (LOVENOX) injection  40 mg Subcutaneous Daily   FLUoxetine  40 mg Oral Daily   lidocaine  1 patch Transdermal Q24H   melatonin  5 mg Oral QHS   sodium bicarbonate  650 mg Oral TID   topiramate  50 mg Oral Daily   Continuous Infusions:   PRN Meds:.acetaminophen, labetalol, loperamide, LORazepam, polyethylene glycol, prochlorperazine, topiramate **AND** topiramate, traZODone  Antimicrobials: Anti-infectives (From admission, onward)    None        I have personally reviewed the following labs and images: CBC: Recent Labs  Lab 01/07/24 1749 01/07/24 1847 01/07/24 2328 01/09/24 0739 01/10/24 0513  WBC 12.7*  --  10.9* 7.7 8.4  NEUTROABS 10.2*  --   --   --   --    HGB 14.6 15.0 13.4 14.0 14.7  HCT 43.5 44.0 39.4 41.7 43.9  MCV 85.6  --  85.7 85.8 85.1  PLT 355  --  311 307 336   BMP &GFR Recent Labs  Lab 01/07/24 1749 01/07/24 1847 01/07/24 2231 01/07/24 2328 01/09/24 0739 01/10/24 0513  NA 136 138  --  137 137 135  K 3.7 3.7  --  3.3* 3.7 3.4*  CL 107 108  --  106 108 108  CO2 19*  --   --  20* 20* 19*  GLUCOSE 97 95  --  102* 104* 87  BUN 10 10  --  7 5* 8  CREATININE 0.72 0.70  --  0.67 0.74 0.80  CALCIUM 9.1  --  8.9 8.6* 8.7* 8.7*  MG 1.9  --   --  1.7 2.0 1.9  PHOS  --   --   --  3.1 3.4 3.9   Estimated Creatinine Clearance: 112.1 mL/min (by C-G formula based on SCr of 0.8 mg/dL). Liver & Pancreas: Recent Labs  Lab 01/07/24 1749 01/09/24 0739 01/10/24 0513  AST 19  --   --  ALT 14  --   --   ALKPHOS 52  --   --   BILITOT 0.7  --   --   PROT 6.9  --   --   ALBUMIN 3.7 3.5 3.7   No results for input(s): "LIPASE", "AMYLASE" in the last 168 hours. No results for input(s): "AMMONIA" in the last 168 hours. Diabetic: No results for input(s): "HGBA1C" in the last 72 hours.  Recent Labs  Lab 01/08/24 1014  GLUCAP 91   Cardiac Enzymes: No results for input(s): "CKTOTAL", "CKMB", "CKMBINDEX", "TROPONINI" in the last 168 hours. No results for input(s): "PROBNP" in the last 8760 hours. Coagulation Profile: Recent Labs  Lab 01/07/24 1822  INR 1.0   Thyroid Function Tests: No results for input(s): "TSH", "T4TOTAL", "FREET4", "T3FREE", "THYROIDAB" in the last 72 hours. Lipid Profile: No results for input(s): "CHOL", "HDL", "LDLCALC", "TRIG", "CHOLHDL", "LDLDIRECT" in the last 72 hours.  Anemia Panel: Recent Labs    01/09/24 1518  FERRITIN 19  TIBC 566*  IRON 75   Urine analysis:    Component Value Date/Time   COLORURINE YELLOW 01/07/2024 1949   APPEARANCEUR CLEAR 01/07/2024 1949   APPEARANCEUR Clear 09/08/2022 0800   LABSPEC 1.015 01/07/2024 1949   PHURINE 6.0 01/07/2024 1949   GLUCOSEU NEGATIVE  01/07/2024 1949   HGBUR SMALL (A) 01/07/2024 1949   BILIRUBINUR NEGATIVE 01/07/2024 1949   BILIRUBINUR Negative 09/08/2022 0800   KETONESUR 40 (A) 01/07/2024 1949   PROTEINUR NEGATIVE 01/07/2024 1949   UROBILINOGEN 0.2 07/20/2022 1553   UROBILINOGEN 0.2 03/31/2013 2122   NITRITE NEGATIVE 01/07/2024 1949   LEUKOCYTESUR NEGATIVE 01/07/2024 1949   Sepsis Labs: Invalid input(s): "PROCALCITONIN", "LACTICIDVEN"  Microbiology: No results found for this or any previous visit (from the past 240 hours).  Radiology Studies: No results found.     Kynzi Levay T. Rudean Icenhour [facility]  If 7PM-7AM, please contact night-coverage [email or web address] 01/11/2024, 2:32 PM

## 2024-01-11 NOTE — Plan of Care (Signed)
   Problem: Education: Goal: Knowledge of disease or condition will improve Outcome: Progressing   Problem: Ischemic Stroke/TIA Tissue Perfusion: Goal: Complications of ischemic stroke/TIA will be minimized Outcome: Progressing   Problem: Coping: Goal: Will verbalize positive feelings about self Outcome: Progressing Goal: Will identify appropriate support needs Outcome: Progressing

## 2024-01-11 NOTE — Progress Notes (Signed)
 Physical Therapy Treatment Patient Details Name: Faith Jordan MRN: [id number] DOB: [birth year] Today's Date: 01/11/2024   History of Present Illness 28 y.o. female presents to [facility] 01/07/24 with L sided weakness, Code Stroke called. 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 PMHx: familial hemiplegic migraine, angioedema 2017, dysautonomia    PT Comments  Pt seen for PT tx with pt agreeable to tx, mother present in room. Pt is extremely motivated to participate in therapy. Continued gait training without AD for added focus of high level balance. Pt able to demonstrate increased LLE glute, quad activation in stance phase to assist with balance. Pt performed STS with activity focusing on LLE strengthening & NMR. Pt is extremely motivated to participate & hopeful to return to work. PT educated pt on stroke recovery & what to expect in CIR. Pt is an excellent candidate for CIR & would benefit from post acute rehab >3 hours therapy/day to maximize independence with mobility.     If plan is discharge home, recommend the following: A lot of help with walking and/or transfers;A lot of help with bathing/dressing/bathroom;Assistance with cooking/housework;Help with stairs or ramp for entrance;Assistance with feeding;Assist for transportation   Can travel by private vehicle        Equipment Recommendations  Wheelchair (measurements PT);Wheelchair cushion (measurements PT)    Recommendations for Other Services Rehab consult     Precautions / Restrictions Precautions Precautions: Fall Recall of Precautions/Restrictions: Intact Precaution/Restrictions Comments: SBP < 180 Restrictions Weight Bearing Restrictions Per Provider Order: No     Mobility  Bed Mobility   Bed Mobility: Supine to Sit     Supine to sit: HOB elevated, Used rails, Supervision Sit to supine: Supervision, HOB elevated, Used rails (extra time to elevate LLE onto bed)         Transfers Overall transfer level: Needs assistance Equipment used: None Transfers: Sit to/from Stand Sit to Stand: Min assist           General transfer comment: STS from EOB    Ambulation/Gait Ambulation/Gait assistance: Mod assist, Max assist Gait Distance (Feet): 140 Feet Assistive device: 1 person hand held assist Gait Pattern/deviations: Decreased stride length, Decreased stance time - left, Trendelenburg Gait velocity: decreased     General Gait Details: Pt ambulated to nurses station & back without AD with PT supporting LUE throughout gait. Pt presents with LLE genu recurvatum in stance phase. Pt is able to advance LLE with cuing for increased LLE quad & glute activation in stance phase to assist with balance with good return demo from pt.   Stairs             Wheelchair Mobility     Tilt Bed    Modified Rankin (Stroke Patients Only)       Balance Overall balance assessment: Needs assistance Sitting-balance support: No upper extremity supported, Feet supported Sitting balance-Leahy Scale: Good     Standing balance support: During functional activity, No upper extremity supported Standing balance-Leahy Scale: Poor                              Communication Communication Communication: No apparent difficulties Factors Affecting Communication: Reduced clarity of speech  Cognition Arousal: Alert Behavior During Therapy: WFL for tasks assessed/performed   PT - Cognitive impairments: No apparent impairments  PT - Cognition Comments: extremely motivated to participate Following commands: Intact      Cueing Cueing Techniques: Verbal cues  Exercises Other Exercises Other Exercises: Pt performed 5x STS x 2 sets from EOB with LLE underneath BOS, RLE out in front of her, BUE on LLE with focus on increased weight bearing & powering up with LLE with focus on NMR & strengthening. Pt required light mod  assist fade to min assist with improving eccentric control as activity progressed.    General Comments General comments (skin integrity, edema, etc.): Educated pt on stroke recovery & what to anticipate in CIR.      Pertinent Vitals/Pain Pain Assessment Pain Assessment: Faces Faces Pain Scale: Hurts a little bit Pain Location: back Pain Descriptors / Indicators: Discomfort Pain Intervention(s): Repositioned, Monitored during session    Home Living                          Prior Function            PT Goals (current goals can now be found in the care plan section) Acute Rehab PT Goals Patient Stated Goal: to be able to walk PT Goal Formulation: With patient/family Time For Goal Achievement: 01/22/24 Potential to Achieve Goals: Good Progress towards PT goals: Progressing toward goals    Frequency    Min 3X/week      PT Plan      Co-evaluation              AM-PAC PT "6 Clicks" Mobility   Outcome Measure  Help needed turning from your back to your side while in a flat bed without using bedrails?: None Help needed moving from lying on your back to sitting on the side of a flat bed without using bedrails?: A Little Help needed moving to and from a bed to a chair (including a wheelchair)?: A Little Help needed standing up from a chair using your arms (e.g., wheelchair or bedside chair)?: A Little Help needed to walk in hospital room?: A Lot Help needed climbing 3-5 steps with a railing? : Total 6 Click Score: 16    End of Session Equipment Utilized During Treatment: Gait belt Activity Tolerance: Patient tolerated treatment well Patient left: in bed;with call bell/phone within reach;with bed alarm set;with family/visitor present Nurse Communication: Mobility status PT Visit Diagnosis: Unsteadiness on feet (R26.81);Other abnormalities of gait and mobility (R26.89);Muscle weakness (generalized) (M62.81);Hemiplegia and hemiparesis Hemiplegia -  Right/Left: Left Hemiplegia - dominant/non-dominant: Non-dominant Hemiplegia - caused by: Cerebral infarction     Time: 1128-1150 PT Time Calculation (min) (ACUTE ONLY): 22 min  Charges:    $Neuromuscular Re-education: 8-22 mins PT General Charges $$ ACUTE PT VISIT: 1 Visit                     Aleda Grana, PT, DPT 01/11/24, 12:45 PM  Sandi Mariscal 01/11/2024, 12:43 PM

## 2024-01-11 NOTE — Progress Notes (Signed)
 Inpatient Rehab Admissions Coordinator:    I continue to await insurance auth for CIR. I called and was told that referral had been received but has not yet been reviewed by medical team. Pt. And family updated.   Megan Salon, MS, CCC-SLP Rehab Admissions Coordinator  [phone] (celll) [phone] (office)

## 2024-01-11 NOTE — Progress Notes (Signed)
 Speech Language Pathology Treatment: Cognitive-Linquistic  Patient Details Name: Faith Jordan MRN: [id number] DOB: 1996-08-25 Today's Date: 01/11/2024 Time: 1109-1130 SLP Time Calculation (min) (ACUTE ONLY): 21 min  Assessment / Plan / Recommendation Clinical Impression  There has been significant improvement in speech since Monday however there are mild distortions present. Mom agrees it has improved but notices she had difficulty with certain sounds /w/, /r/ and is more pronounced when she is tired and is not back to baseline yet. She was able to state 3/4 speech strategies and implemented during session. She imitated multisyllabic words containing /w/ and /r/. SLP recommended to continue to overarticulate especially when she is fatigued and can Google multisyllabic words to practice.  She answered questions from a mock menu and was able to add double and single digits using paper timely and accurately and additional questions with 100% accuracy. Pt also used executive functioning (planning, process of elimination etc) activity where she had to fill out a schedule/times during the day given multiple clues and was able to complete timely with 100% accuracy. Mother states she has given pt shopping/subtraction scenarios and had her write time on clock with 100%. She is recommended to go to inpatient rehab and is waiting on insurance approval per pt. 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 On acute venue ST will focus on her dysarthria.    HPI HPI: 28 y.o. female presents to [facility] 01/07/24 with L sided weakness, Code Stroke called. 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 PMHx: familial hemiplegic migraine, angioedema 2017, dysautonomia      SLP Plan  Continue with current plan of care      Recommendations for follow  up therapy are one component of a multi-disciplinary discharge planning process, led by the attending physician.  Recommendations may be updated based on patient status, additional functional criteria and insurance authorization.    Recommendations                   Rehab consult Oral care BID   Set up Supervision/Assistance Cognitive communication deficit (R41.841);Dysarthria and anarthria (R47.1)     Continue with current plan of care     Royce Macadamia  01/11/2024, 11:39 AM

## 2024-01-12 ENCOUNTER — Inpatient Hospital Stay (HOSPITAL_COMMUNITY)
Admission: AD | Admit: 2024-01-12 | Discharge: 2024-01-28 | DRG: 945 | Disposition: A | Discharge status: Home / Self Care | Source: Intra-hospital | Attending: Physical Medicine & Rehabilitation | Admitting: Physical Medicine & Rehabilitation

## 2024-01-12 ENCOUNTER — Other Ambulatory Visit: Payer: Self-pay

## 2024-01-12 DIAGNOSIS — G43709 Chronic migraine without aura, not intractable, without status migrainosus: Secondary | ICD-10-CM | POA: Diagnosis not present

## 2024-01-12 DIAGNOSIS — Z882 Allergy status to sulfonamides status: Secondary | ICD-10-CM

## 2024-01-12 DIAGNOSIS — Z8616 Personal history of COVID-19: Secondary | ICD-10-CM

## 2024-01-12 DIAGNOSIS — I69392 Facial weakness following cerebral infarction: Secondary | ICD-10-CM | POA: Diagnosis not present

## 2024-01-12 DIAGNOSIS — G909 Disorder of the autonomic nervous system, unspecified: Secondary | ICD-10-CM | POA: Diagnosis not present

## 2024-01-12 DIAGNOSIS — I639 Cerebral infarction, unspecified: Secondary | ICD-10-CM | POA: Diagnosis not present

## 2024-01-12 DIAGNOSIS — Z818 Family history of other mental and behavioral disorders: Secondary | ICD-10-CM

## 2024-01-12 DIAGNOSIS — Z79890 Hormone replacement therapy (postmenopausal): Principal | ICD-10-CM

## 2024-01-12 DIAGNOSIS — Z886 Allergy status to analgesic agent status: Secondary | ICD-10-CM

## 2024-01-12 DIAGNOSIS — I6381 Other cerebral infarction due to occlusion or stenosis of small artery: Principal | ICD-10-CM | POA: Diagnosis present

## 2024-01-12 DIAGNOSIS — K5901 Slow transit constipation: Secondary | ICD-10-CM | POA: Diagnosis not present

## 2024-01-12 DIAGNOSIS — G479 Sleep disorder, unspecified: Secondary | ICD-10-CM

## 2024-01-12 DIAGNOSIS — R258 Other abnormal involuntary movements: Secondary | ICD-10-CM | POA: Diagnosis not present

## 2024-01-12 DIAGNOSIS — R630 Anorexia: Secondary | ICD-10-CM | POA: Diagnosis present

## 2024-01-12 DIAGNOSIS — I69354 Hemiplegia and hemiparesis following cerebral infarction affecting left non-dominant side: Secondary | ICD-10-CM

## 2024-01-12 DIAGNOSIS — Z7902 Long term (current) use of antithrombotics/antiplatelets: Secondary | ICD-10-CM | POA: Diagnosis not present

## 2024-01-12 DIAGNOSIS — Z793 Long term (current) use of hormonal contraceptives: Secondary | ICD-10-CM | POA: Diagnosis not present

## 2024-01-12 DIAGNOSIS — G901 Familial dysautonomia [Riley-Day]: Secondary | ICD-10-CM | POA: Diagnosis present

## 2024-01-12 DIAGNOSIS — Z602 Problems related to living alone: Secondary | ICD-10-CM | POA: Diagnosis present

## 2024-01-12 DIAGNOSIS — R296 Repeated falls: Secondary | ICD-10-CM | POA: Diagnosis present

## 2024-01-12 DIAGNOSIS — G43409 Hemiplegic migraine, not intractable, without status migrainosus: Secondary | ICD-10-CM | POA: Diagnosis present

## 2024-01-12 DIAGNOSIS — E876 Hypokalemia: Secondary | ICD-10-CM | POA: Diagnosis not present

## 2024-01-12 DIAGNOSIS — R002 Palpitations: Secondary | ICD-10-CM

## 2024-01-12 DIAGNOSIS — Z79899 Other long term (current) drug therapy: Secondary | ICD-10-CM

## 2024-01-12 DIAGNOSIS — Z8701 Personal history of pneumonia (recurrent): Secondary | ICD-10-CM

## 2024-01-12 DIAGNOSIS — Z8673 Personal history of transient ischemic attack (TIA), and cerebral infarction without residual deficits: Secondary | ICD-10-CM | POA: Diagnosis present

## 2024-01-12 DIAGNOSIS — F429 Obsessive-compulsive disorder, unspecified: Secondary | ICD-10-CM

## 2024-01-12 DIAGNOSIS — Z825 Family history of asthma and other chronic lower respiratory diseases: Secondary | ICD-10-CM

## 2024-01-12 DIAGNOSIS — J45909 Unspecified asthma, uncomplicated: Secondary | ICD-10-CM | POA: Diagnosis present

## 2024-01-12 DIAGNOSIS — F410 Panic disorder [episodic paroxysmal anxiety] without agoraphobia: Secondary | ICD-10-CM

## 2024-01-12 DIAGNOSIS — I69322 Dysarthria following cerebral infarction: Secondary | ICD-10-CM

## 2024-01-12 DIAGNOSIS — M2669 Other specified disorders of temporomandibular joint: Secondary | ICD-10-CM | POA: Diagnosis present

## 2024-01-12 DIAGNOSIS — T384X5S Adverse effect of oral contraceptives, sequela: Secondary | ICD-10-CM | POA: Diagnosis not present

## 2024-01-12 DIAGNOSIS — Z820 Family history of epilepsy and other diseases of the nervous system: Secondary | ICD-10-CM | POA: Diagnosis not present

## 2024-01-12 DIAGNOSIS — F54 Psychological and behavioral factors associated with disorders or diseases classified elsewhere: Secondary | ICD-10-CM | POA: Diagnosis not present

## 2024-01-12 DIAGNOSIS — E785 Hyperlipidemia, unspecified: Secondary | ICD-10-CM | POA: Diagnosis present

## 2024-01-12 LAB — CBC
HCT: 46.3 % — ABNORMAL HIGH (ref 36.0–46.0)
Hemoglobin: 16.1 g/dL — ABNORMAL HIGH (ref 12.0–15.0)
MCH: 29.1 pg (ref 26.0–34.0)
MCHC: 34.8 g/dL (ref 30.0–36.0)
MCV: 83.7 fL (ref 80.0–100.0)
Platelets: 385 10*3/uL (ref 150–400)
RBC: 5.53 MIL/uL — ABNORMAL HIGH (ref 3.87–5.11)
RDW: 13.2 % (ref 11.5–15.5)
WBC: 11.4 10*3/uL — ABNORMAL HIGH (ref 4.0–10.5)
nRBC: 0 % (ref 0.0–0.2)

## 2024-01-12 LAB — CREATININE, SERUM
Creatinine, Ser: 0.87 mg/dL (ref 0.44–1.00)
GFR, Estimated: >60 mL/min (ref >60)

## 2024-01-12 LAB — FACTOR 5 LEIDEN

## 2024-01-12 LAB — PROTHROMBIN GENE MUTATION

## 2024-01-12 MED ORDER — CALCIUM POLYCARBOPHIL 625 MG PO TABS
625.0000 mg | ORAL_TABLET | Freq: Every day | ORAL | Status: DC
  Administered 2024-01-12 – 2024-01-28 (×17): 625 mg via ORAL
  Filled 2024-01-12 (×17): qty 1

## 2024-01-12 MED ORDER — CLOPIDOGREL BISULFATE 75 MG PO TABS: 75.0000 mg | ORAL_TABLET | Freq: Every day | ORAL | Status: DC

## 2024-01-12 MED ORDER — POLYETHYLENE GLYCOL 3350 17 G PO PACK: 17.0000 g | PACK | Freq: Every day | ORAL | Status: DC | PRN

## 2024-01-12 MED ORDER — CLOPIDOGREL BISULFATE 75 MG PO TABS
75.0000 mg | ORAL_TABLET | Freq: Every day | ORAL | Status: DC
  Administered 2024-01-13 – 2024-01-28 (×16): 75 mg via ORAL
  Filled 2024-01-12 (×16): qty 1

## 2024-01-12 MED ORDER — ACETAMINOPHEN 325 MG PO TABS
650.0000 mg | ORAL_TABLET | Freq: Four times a day (QID) | ORAL | Status: DC | PRN
  Filled 2024-01-12: qty 2

## 2024-01-12 MED ORDER — ACETAMINOPHEN 325 MG PO TABS: 650.0000 mg | ORAL_TABLET | Freq: Four times a day (QID) | ORAL | Status: AC | PRN

## 2024-01-12 MED ORDER — ATORVASTATIN CALCIUM 80 MG PO TABS: 80.0000 mg | ORAL_TABLET | Freq: Every day | ORAL | Status: DC

## 2024-01-12 MED ORDER — TOPIRAMATE 25 MG PO TABS
50.0000 mg | ORAL_TABLET | Freq: Every day | ORAL | Status: DC
Start: 2024-01-13 — End: 2024-01-28
  Administered 2024-01-13 – 2024-01-28 (×16): 50 mg via ORAL
  Filled 2024-01-12 (×16): qty 2

## 2024-01-12 MED ORDER — MELATONIN 5 MG PO TABS
5.0000 mg | ORAL_TABLET | Freq: Every day | ORAL | Status: DC
  Administered 2024-01-12 – 2024-01-27 (×16): 5 mg via ORAL
  Filled 2024-01-12 (×16): qty 1

## 2024-01-12 MED ORDER — LOPERAMIDE HCL 2 MG PO CAPS
2.0000 mg | ORAL_CAPSULE | ORAL | Status: DC | PRN
  Administered 2024-01-12: 2 mg via ORAL
  Filled 2024-01-12: qty 1

## 2024-01-12 MED ORDER — ENOXAPARIN SODIUM 40 MG/0.4ML IJ SOSY
40.0000 mg | PREFILLED_SYRINGE | Freq: Every day | INTRAMUSCULAR | Status: DC
  Administered 2024-01-13 – 2024-01-16 (×4): 40 mg via SUBCUTANEOUS
  Filled 2024-01-12 (×4): qty 0.4

## 2024-01-12 MED ORDER — TOPIRAMATE 25 MG PO TABS
50.0000 mg | ORAL_TABLET | Freq: Every day | ORAL | Status: DC | PRN
  Filled 2024-01-12: qty 2

## 2024-01-12 MED ORDER — LORAZEPAM 0.5 MG PO TABS
1.0000 mg | ORAL_TABLET | Freq: Four times a day (QID) | ORAL | Status: DC | PRN
  Administered 2024-01-19: 1 mg via ORAL
  Filled 2024-01-12: qty 2

## 2024-01-12 MED ORDER — FLUOXETINE HCL 20 MG PO CAPS
40.0000 mg | ORAL_CAPSULE | Freq: Every day | ORAL | Status: DC
  Administered 2024-01-13 – 2024-01-28 (×16): 40 mg via ORAL
  Filled 2024-01-12 (×16): qty 2

## 2024-01-12 MED ORDER — TRAZODONE HCL 50 MG PO TABS
100.0000 mg | ORAL_TABLET | Freq: Every day | ORAL | Status: DC
  Administered 2024-01-12 – 2024-01-27 (×16): 100 mg via ORAL
  Filled 2024-01-12 (×16): qty 2

## 2024-01-12 MED ORDER — LIDOCAINE 5 % EX PTCH
1.0000 | MEDICATED_PATCH | CUTANEOUS | Status: DC
  Administered 2024-01-12 – 2024-01-26 (×14): 1 via TRANSDERMAL
  Filled 2024-01-12 (×16): qty 1

## 2024-01-12 MED ORDER — TRAZODONE HCL 50 MG PO TABS: 100.0000 mg | ORAL_TABLET | Freq: Every evening | ORAL | Status: DC | PRN

## 2024-01-12 MED ORDER — ENOXAPARIN SODIUM 40 MG/0.4ML IJ SOSY: 40.0000 mg | PREFILLED_SYRINGE | INTRAMUSCULAR | Status: DC

## 2024-01-12 MED ORDER — ATORVASTATIN CALCIUM 80 MG PO TABS
80.0000 mg | ORAL_TABLET | Freq: Every day | ORAL | Status: DC
  Administered 2024-01-13 – 2024-01-28 (×16): 80 mg via ORAL
  Filled 2024-01-12 (×16): qty 1

## 2024-01-12 MED ORDER — RISAQUAD PO CAPS
1.0000 | ORAL_CAPSULE | Freq: Every day | ORAL | Status: DC
  Administered 2024-01-12 – 2024-01-28 (×17): 1 via ORAL
  Filled 2024-01-12 (×19): qty 1

## 2024-01-12 MED ORDER — SODIUM BICARBONATE 650 MG PO TABS
650.0000 mg | ORAL_TABLET | Freq: Three times a day (TID) | ORAL | Status: DC
  Administered 2024-01-12 – 2024-01-16 (×12): 650 mg via ORAL
  Filled 2024-01-12 (×12): qty 1

## 2024-01-12 NOTE — Discharge Summary (Signed)
 Physician Discharge Summary  Faith Jordan:[id number] DOB: [birth year] DOA: 01/07/2024  PCP: Doreene Nest, NP  Admit date: 01/07/2024 Discharge date: 01/12/24  Admitted From: Home Disposition: CIR  Recommendations for Outpatient Follow-up:  Neurology to arrange outpatient follow-up. 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    She initially thought her left-sided weakness were related to her hemiplegic migraine.  However, they usually involve her face and not other parts of her body.  She took her migraine medications but her hemiaplasia persisted which prompted her to come to ER.  Patient is on OCP for birth control.   In ED, code stroke activated.  Evaluated by neurology. Last known well at 8:00 PM on 01/06/2024.  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  CT angio head and neck without significant finding.  Noncontrast MRI brain revealed 10 mm acute nonhemorrhagic infarct of the posterior limb of the right internal capsule, susceptibility related to calcification in globus pallidus bilaterally.  Patient was loaded with Plavix, and admitted for further evaluation.   TTE, TEE, TCD, lower extremity venous Doppler and hypercoagulable labs negative.  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  Patient has been started on Plavix and high intensity statin.  No aspirin due to NSAID allergy.  Some improvement in her left upper extremity  weakness.  Therapy recommended CIR.   Outpatient follow-up with neurology for migraine and CVA    See individual problem list below for more.   Problems addressed during this hospitalization Acute right basal ganglia infarct: Presents with left hemiparesis and left facial droop that goes along with MRI finding.  No history of diabetes, hypertension or tobacco use.  History of familial hemiplegic migraine.  Also on OCP for birth control.  CT angio head and neck, TTE, TEE, TCD and LE venous Doppler negative.  A1c 4.9%.  LDL 106.  CRP and ESR within normal.  Hypercoagulable labs negative so far.  Loaded with Plavix 300 mg in ED.  Patient with history of angioedema due to NSAID. -Appreciate input by neurology -Continue Plavix and Lipitor.  Not on aspirin due to NSAID allergy. -Discontinue OCP, Emgality and Nurtec until outpatient follow-up with her neurologist -Follow factor V Leyden and prothrombin gene mutation -PT/OT/SLP-recommended CIR.   Abnormal CT head/MRI brain: Nonspecific dense calcification within globus pallidus bilaterally.  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  Patient has no symptoms of Fahr's disease other than anxiety.  -Defer to neurology-outpatient follow-up.   History of familial hemiplegic migraine: Multiple family members including mother, maternal aunt, and maternal cousin have this diagnosis as well.  -Emgality and Nurtec discontinued per recommendation by patient's primary neurologist. -Continue topiramate -Outpatient follow-up with neurology.   History of anxiety/panic attack/sleep disorder/insomnia -Continue home Prozac, trazodone and Ativan   Binocular blurry vision: Not consistent with a CVA distribution.  Has glasses that she rarely wears.  -Outpatient follow-up with ophthalmology   Muscle spasms, acute: Seems to have resolved.   Hypokalemia:  -Recheck CMP in  1 week   Hyperlipidemia: LDL 106.  Goal less than 70 -Continue  Lipitor 80 mg daily            Time spent 35 minutes  Vital signs Vitals:   01/11/24 2347 01/12/24 0342 01/12/24 0802 01/12/24 1156  BP: 99/68 100/61 105/76 104/70  Pulse: 71 74 85 86  Temp: 98 F (36.7 C) 98.2 F (36.8 C) 98.3 F (36.8 C) 97.7 F (36.5 C)  Resp: 14 14 18    Height:      Weight:      SpO2: 96% 99% 96% 97%  TempSrc: Oral Oral Oral Oral  BMI (Calculated):         Discharge exam    Discharge Instructions Discharge Instructions     Diet general   Complete by: As directed    Increase activity slowly   Complete by: As directed       Allergies as of 01/12/2024       Reactions   Advil [ibuprofen] Dermatitis   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, blistering peeling skin   Nsaids Dermatitis   Reaction similar to Stephens-Johnson, blistering peeling rash   Sulfa Antibiotics Swelling   Facial swelling        Medication List     STOP taking these medications    Daysee 0.15-0.03 &0.01 MG tablet Generic drug: Levonorgestrel-Ethinyl Estradiol   Emgality 120 MG/ML Soaj Generic drug: Galcanezumab-gnlm   Nurtec 75 MG Tbdp Generic drug: Rimegepant Sulfate       TAKE these medications    acetaminophen 325 MG tablet Commonly known as: TYLENOL Take 2 tablets (650 mg total) by mouth every 6 (six) hours as needed for mild pain (pain score 1-3), fever or headache.   atorvastatin 80 MG tablet Commonly known as: LIPITOR Take 1 tablet (80 mg total) by mouth daily. Start taking on: January 13, 2024   clopidogrel 75 MG tablet Commonly known as: PLAVIX Take 1 tablet (75 mg total) by mouth daily. Start taking on: January 13, 2024   FLUoxetine 40 MG capsule Commonly known as: PROZAC Take 1 capsule (40 mg total) by mouth daily.   LORazepam 0.5 MG tablet Commonly known as: ATIVAN Take 1 tablet (0.5 mg total) by mouth 2 (two) times daily as needed for anxiety and panic attacks.   topiramate 50 MG tablet Commonly known as: TOPAMAX Take 1 tablet  (50 mg total) by mouth daily. May also take 1 tablet (50 mg total) daily as needed (daily during menstrual cycle).   traZODone 50 MG tablet Commonly known as: DESYREL Take 1-3 tablets (50-150 mg total) by mouth at bedtime as needed for sleep.        Consultations: Neurology  Procedures/Studies:   ECHO TEE Result Date: 2024/01/29    TRANSESOPHOGEAL ECHO REPORT   Patient Name:   LONDEN LORGE Date of Exam: 2024/01/29 Medical Rec #:  [id number]      Height:       67.0 in Accession #:    [id number]     Weight:       170.0 lb Date of Birth:  Aug 10, 1996      BSA:          1.887 m Patient Age:    28 years       BP:           139/85 mmHg Patient Gender: F              HR:  96 bpm. Exam Location:  Inpatient Procedure: Transesophageal Echo, Color Doppler and Cardiac Doppler (Both            Spectral and Color Flow Doppler were utilized during procedure). Indications:     Stroke  History:         Patient has prior history of Echocardiogram examinations, most                  recent 01/08/2024.  Sonographer:     Harriette Bouillon RDCS Referring Phys:  Almon Hercules Diagnosing Phys: Donato Schultz MD PROCEDURE: The transesophogeal probe was passed without difficulty through the esophogus of the patient. Sedation performed by different physician. The patient's vital signs; including heart rate, blood pressure, and oxygen saturation; remained stable throughout the procedure. The patient developed no complications during the procedure.  IMPRESSIONS  1. Left ventricular ejection fraction, by estimation, is 60 to 65%. The left ventricle has normal function. The left ventricle has no regional wall motion abnormalities.  2. Right ventricular systolic function is normal. The right ventricular size is normal.  3. No left atrial/left atrial appendage thrombus was detected.  4. The mitral valve is normal in structure. No evidence of mitral valve regurgitation. No evidence of mitral stenosis.  5. The aortic valve is normal  in structure. Aortic valve regurgitation is not visualized. No aortic stenosis is present.  6. The inferior vena cava is normal in size with greater than 50% respiratory variability, suggesting right atrial pressure of 3 mmHg.  7. Agitated saline contrast bubble study was negative, with no evidence of any interatrial shunt. Conclusion(s)/Recommendation(s): Normal biventricular function without evidence of hemodynamically significant valvular heart disease. FINDINGS  Left Ventricle: Left ventricular ejection fraction, by estimation, is 60 to 65%. The left ventricle has normal function. The left ventricle has no regional wall motion abnormalities. The left ventricular internal cavity size was normal in size. There is  no left ventricular hypertrophy. Right Ventricle: The right ventricular size is normal. No increase in right ventricular wall thickness. Right ventricular systolic function is normal. Left Atrium: Left atrial size was normal in size. No left atrial/left atrial appendage thrombus was detected. Right Atrium: Right atrial size was normal in size. Pericardium: There is no evidence of pericardial effusion. Mitral Valve: The mitral valve is normal in structure. No evidence of mitral valve regurgitation. No evidence of mitral valve stenosis. Tricuspid Valve: The tricuspid valve is normal in structure. Tricuspid valve regurgitation is not demonstrated. No evidence of tricuspid stenosis. Aortic Valve: The aortic valve is normal in structure. Aortic valve regurgitation is not visualized. No aortic stenosis is present. Pulmonic Valve: The pulmonic valve was normal in structure. Pulmonic valve regurgitation is not visualized. No evidence of pulmonic stenosis. Aorta: The aortic root is normal in size and structure. Venous: The inferior vena cava is normal in size with greater than 50% respiratory variability, suggesting right atrial pressure of 3 mmHg. IAS/Shunts: There is redundancy of the interatrial septum. No  atrial level shunt detected by color flow Doppler. Agitated saline contrast bubble study was negative, with no evidence of any interatrial shunt. There is no evidence of a patent foramen ovale. There is no evidence of an atrial septal defect. Donato Schultz MD Electronically signed by Donato Schultz MD Signature Date/Time: 01/10/2024/12:30:28 PM    Final    EP STUDY Result Date: 01/10/2024 See surgical note for result.  VAS [place] TRANSCRANIAL DOPPLER W BUBBLES Result Date: 01/09/2024  Transcranial Doppler with Bubble Patient Name:  ARLONE  B Keilman  Date of Exam:   01/08/2024 Medical Rec #: [id number]       Accession #:    [id number] Date of Birth: 05-01-1996       Patient Gender: F Patient Age:   24 years Exam Location:  [facility] Procedure:      VAS [place] TRANSCRANIAL DOPPLER W BUBBLES Referring Phys: Gevena Mart --------------------------------------------------------------------------------  Indications: Stroke. History: Left side weakness, bilateral tingling of arms, aphasia. History of hemiplegic migraines. Limitations for diagnostic windows: Unable to insonate right transtemporal window. Comparison Study: No prior study Performing Technologist: Sherren Kerns RVS  Examination Guidelines: A complete evaluation includes B-mode imaging, spectral Doppler, color Doppler, and power Doppler as needed of all accessible portions of each vessel. Bilateral testing is considered an integral part of a complete examination. Limited examinations for reoccurring indications may be performed as noted.  Summary: No HITS at rest or during Valsalva. Negative transcranial Doppler Bubble study with no evidence of right to left intracardiac communication.  A vascular evaluation was performed. The right middle cerebral artery was studied. An IV was inserted into the patient's left Forearm. Verbal informed consent was obtained.  Negative TCD Bubble study *See table(s) above for TCD measurements and observations.  Diagnosing physician:  Delia Heady MD Electronically signed by Delia Heady MD on 01/09/2024 at 1:34:41 PM.    Final    VAS [place] LOWER EXTREMITY VENOUS (DVT) Result Date: 01/08/2024  Lower Venous DVT Study Patient Name:  HOANG REICH Majeed  Date of Exam:   01/08/2024 Medical Rec #: [id number]       Accession #:    [id number] Date of Birth: 10-16-1996       Patient Gender: F Patient Age:   18 years Exam Location:  [facility] Procedure:      VAS [place] LOWER EXTREMITY VENOUS (DVT) Referring Phys: Gevena Mart --------------------------------------------------------------------------------  Indications: Stroke.  Limitations: Depth of vessels. Comparison Study: No prior study on file Performing Technologist: Sherren Kerns RVS  Examination Guidelines: A complete evaluation includes B-mode imaging, spectral Doppler, color Doppler, and power Doppler as needed of all accessible portions of each vessel. Bilateral testing is considered an integral part of a complete examination. Limited examinations for reoccurring indications may be performed as noted. The reflux portion of the exam is performed with the patient in reverse Trendelenburg.  +---------+---------------+---------+-----------+----------+--------------+ RIGHT    CompressibilityPhasicitySpontaneityPropertiesThrombus Aging +---------+---------------+---------+-----------+----------+--------------+ CFV      Full           Yes      Yes                                 +---------+---------------+---------+-----------+----------+--------------+ SFJ      Full                                                        +---------+---------------+---------+-----------+----------+--------------+ FV Prox  Full                                                        +---------+---------------+---------+-----------+----------+--------------+ FV Mid   Full                                                         +---------+---------------+---------+-----------+----------+--------------+  FV DistalFull           Yes      Yes                                 +---------+---------------+---------+-----------+----------+--------------+ PFV      Full                                                        +---------+---------------+---------+-----------+----------+--------------+ POP      Full           Yes      Yes                                 +---------+---------------+---------+-----------+----------+--------------+ PTV      Full                                                        +---------+---------------+---------+-----------+----------+--------------+ PERO     Full                                                        +---------+---------------+---------+-----------+----------+--------------+ Gastroc  Full                                                        +---------+---------------+---------+-----------+----------+--------------+   +---------+---------------+---------+-----------+----------+-------------------+ LEFT     CompressibilityPhasicitySpontaneityPropertiesThrombus Aging      +---------+---------------+---------+-----------+----------+-------------------+ CFV      Full           Yes      Yes                                      +---------+---------------+---------+-----------+----------+-------------------+ SFJ      Full                                                             +---------+---------------+---------+-----------+----------+-------------------+ FV Prox  Full                                                             +---------+---------------+---------+-----------+----------+-------------------+ FV Mid   Full                                                             +---------+---------------+---------+-----------+----------+-------------------+  FV DistalFull           Yes      Yes                                       +---------+---------------+---------+-----------+----------+-------------------+ PFV      Full                                                             +---------+---------------+---------+-----------+----------+-------------------+ POP                     Yes      Yes                  patent by color and                                                       Doppler             +---------+---------------+---------+-----------+----------+-------------------+ PTV      Full                                                             +---------+---------------+---------+-----------+----------+-------------------+ PERO     Full                                                             +---------+---------------+---------+-----------+----------+-------------------+     Summary: BILATERAL: - No evidence of deep vein thrombosis seen in the lower extremities, bilaterally. -No evidence of popliteal cyst, bilaterally.   *See table(s) above for measurements and observations. Electronically signed by Lemar Livings MD on 01/08/2024 at 5:39:09 PM.    Final    ECHOCARDIOGRAM COMPLETE Result Date: 01/08/2024    ECHOCARDIOGRAM REPORT   Patient Name:   AINHOA RALLO Yager Date of Exam: 01/08/2024 Medical Rec #:  [id number]      Height:       67.0 in Accession #:    [id number]     Weight:       170.0 lb Date of Birth:  Mar 12, 1996      BSA:          1.887 m Patient Age:    28 years       BP:           132/74 mmHg Patient Gender: F              HR:           87 bpm. Exam Location:  Inpatient Procedure: 2D Echo, Color Doppler and Cardiac Doppler (Both Spectral and Color            Flow Doppler were utilized during  procedure). Indications:    Stroke  History:        Patient has prior history of Echocardiogram examinations, most                 recent 02/10/2021.  Sonographer:    Rosaland Lao Referring Phys: [id number] CAROLE N HALL IMPRESSIONS  1. Left ventricular ejection fraction, by  estimation, is 60 to 65%. The left ventricle has normal function. The left ventricle has no regional wall motion abnormalities. Left ventricular diastolic parameters were normal.  2. Right ventricular systolic function is normal. The right ventricular size is normal.  3. The mitral valve is normal in structure. Trivial mitral valve regurgitation. No evidence of mitral stenosis.  4. The aortic valve is normal in structure. Aortic valve regurgitation is not visualized. No aortic stenosis is present.  5. The inferior vena cava is normal in size with greater than 50% respiratory variability, suggesting right atrial pressure of 3 mmHg. Conclusion(s)/Recommendation(s): Normal biventricular function without evidence of hemodynamically significant valvular heart disease. FINDINGS  Left Ventricle: Left ventricular ejection fraction, by estimation, is 60 to 65%. The left ventricle has normal function. The left ventricle has no regional wall motion abnormalities. The left ventricular internal cavity size was normal in size. There is  no left ventricular hypertrophy. Left ventricular diastolic parameters were normal. Right Ventricle: The right ventricular size is normal. No increase in right ventricular wall thickness. Right ventricular systolic function is normal. Left Atrium: Left atrial size was normal in size. Right Atrium: Right atrial size was normal in size. Pericardium: There is no evidence of pericardial effusion. Mitral Valve: The mitral valve is normal in structure. Trivial mitral valve regurgitation. No evidence of mitral valve stenosis. Tricuspid Valve: The tricuspid valve is normal in structure. Tricuspid valve regurgitation is not demonstrated. No evidence of tricuspid stenosis. Aortic Valve: The aortic valve is normal in structure. Aortic valve regurgitation is not visualized. No aortic stenosis is present. Pulmonic Valve: The pulmonic valve was normal in structure. Pulmonic valve regurgitation is not  visualized. No evidence of pulmonic stenosis. Aorta: The aortic root is normal in size and structure. Venous: The inferior vena cava is normal in size with greater than 50% respiratory variability, suggesting right atrial pressure of 3 mmHg. IAS/Shunts: No atrial level shunt detected by color flow Doppler.  LEFT VENTRICLE PLAX 2D LVIDd:         4.20 cm   Diastology LVIDs:         2.60 cm   LV e' medial:    11.90 cm/s LV PW:         0.70 cm   LV E/e' medial:  8.0 LV IVS:        0.80 cm   LV e' lateral:   17.00 cm/s LVOT diam:     1.90 cm   LV E/e' lateral: 5.6 LV SV:         48 LV SV Index:   26 LVOT Area:     2.84 cm  RIGHT VENTRICLE             IVC RV Basal diam:  3.00 cm     IVC diam: 1.10 cm RV S prime:     13.90 cm/s TAPSE (M-mode): 2.0 cm LEFT ATRIUM             Index        RIGHT ATRIUM          Index LA diam:  3.20 cm 1.70 cm/m   RA Area:     9.40 cm LA Vol (A2C):   32.4 ml 17.17 ml/m  RA Volume:   17.60 ml 9.33 ml/m LA Vol (A4C):   31.0 ml 16.43 ml/m LA Biplane Vol: 31.6 ml 16.74 ml/m  AORTIC VALVE LVOT Vmax:   84.60 cm/s LVOT Vmean:  57.700 cm/s LVOT VTI:    0.170 m  AORTA Ao Root diam: 2.20 cm Ao Asc diam:  2.40 cm MITRAL VALVE MV Area (PHT): 5.27 cm    SHUNTS MV Decel Time: 144 msec    Systemic VTI:  0.17 m MV E velocity: 95.00 cm/s  Systemic Diam: 1.90 cm MV A velocity: 62.20 cm/s MV E/A ratio:  1.53 Donato Schultz MD Electronically signed by Donato Schultz MD Signature Date/Time: 01/08/2024/10:00:37 AM    Final    MR BRAIN W WO CONTRAST Result Date: 01/07/2024 CLINICAL DATA:  New onset left-sided weakness. EXAM: MRI HEAD WITHOUT AND WITH CONTRAST TECHNIQUE: Multiplanar, multiecho pulse sequences of the brain and surrounding structures were obtained without and with intravenous contrast. CONTRAST:  8mL GADAVIST GADOBUTROL 1 MMOL/ML IV SOLN COMPARISON:  CT head without contrast and CT angio head and neck 01/07/2024. FINDINGS: Brain: A 10 mm acute nonhemorrhagic infarct is present in the  posterior limb of the right internal capsule. T2 and FLAIR hyperintensities are consistent with the time frame nearly 24 hours. No acute hemorrhage or mass lesion is present. Susceptibility related calcifications in the globus pallidus noted bilaterally. Basal ganglia are otherwise within normal limits. The ventricles are of normal size. No significant extraaxial fluid collection is present. The brainstem and cerebellum are within normal limits. Midline structures are within normal limits. Vascular: Flow is present in the major intracranial arteries. Skull and upper cervical spine: The craniocervical junction is normal. Upper cervical spine is within normal limits. Marrow signal is unremarkable. Sinuses/Orbits: The paranasal sinuses and mastoid air cells are clear. The globes and orbits are within normal limits. IMPRESSION: 1. 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 2. Susceptibility related calcifications in the globus pallidus bilaterally. This likely reflects the sequela of chronic microvascular ischemia. The above was relayed via text pager to Dr. Wilford Corner on 01/07/2024 at 20:52 . Electronically Signed   By: Marin Roberts M.D.   On: 01/07/2024 20:55   CT ANGIO HEAD NECK W WO CM (CODE STROKE) Result Date: 01/07/2024 CLINICAL DATA:  Left hemiplegia. EXAM: CT ANGIOGRAPHY HEAD AND NECK WITH AND WITHOUT CONTRAST TECHNIQUE: Multidetector CT imaging of the head and neck was performed using the standard protocol during bolus administration of intravenous contrast. Multiplanar CT image reconstructions and MIPs were obtained to evaluate the vascular anatomy. Carotid stenosis measurements (when applicable) are obtained utilizing NASCET criteria, using the distal internal carotid diameter as the denominator. RADIATION DOSE REDUCTION: This exam was performed according to the departmental dose-optimization program which includes automated exposure control, adjustment of  the mA and/or kV according to patient size and/or use of iterative reconstruction technique. CONTRAST:  75mL OMNIPAQUE IOHEXOL 350 MG/ML SOLN COMPARISON:  CT head without contrast 01/07/2024 at 6:41 p.m. FINDINGS: CTA NECK FINDINGS Aortic arch: A 3 vessel arch configuration is present. No significant atherosclerotic changes are present at the aortic arch. Great vessel origins are widely patent. No aneurysm or dissection is present. Right carotid system: The right common carotid artery is within normal limits. Bifurcation is within normal limits. The cervical right ICA is normal. Left carotid system: The  left common carotid artery is within normal limits. Bifurcation is unremarkable. Cervical left ICA is normal. Vertebral arteries: The vertebral arteries are codominant. Both vertebral arteries originate from the subclavian arteries without significant stenosis. No significant stenosis is present in either vertebral artery in the neck. Skeleton: The vertebral body heights and alignment are normal. Mild reversal of the normal cervical lordosis may be positional. No focal osseous lesions are present. Other neck: The soft tissues of the neck are otherwise unremarkable. Salivary glands are within normal limits. Thyroid is normal. No significant adenopathy is present. No focal mucosal or submucosal lesions are present. Upper chest: The lung apices are clear. The thoracic inlet is within normal limits. Review of the MIP images confirms the above findings CTA HEAD FINDINGS Anterior circulation: Internal carotid arteries are within normal limits from the skull base to the ICA termini. The A1 and M1 segments are normal. The anterior communicating artery is patent. MCA bifurcations are normal bilaterally. The ACA and MCA branch vessels are normal bilaterally. No aneurysm is present. Posterior circulation: PICA origins are visualized and normal. The vertebrobasilar junction and basilar artery are normal. The superior cerebellar  arteries are patent bilaterally. Both posterior cerebral arteries originate from basilar tip. Small posterior communicating arteries are present. The PCA branch vessels are normal bilaterally. No aneurysm is present. Venous sinuses: The dural sinuses are patent. The straight sinus and deep cerebral veins are intact. Cortical veins are within normal limits. No significant vascular malformation is evident. Anatomic variants: None Review of the MIP images confirms the above findings IMPRESSION: 1. Normal CTA of the neck. 2. Normal CTA Circle of Willis without significant proximal stenosis, aneurysm, or branch vessel occlusion. These results were called by telephone at the time of interpretation on 01/07/2024 at 7:00 pm to provider ERIC [facility] , who verbally acknowledged these results. Electronically Signed   By: Marin Roberts M.D.   On: 01/07/2024 19:05   CT HEAD CODE STROKE WO CONTRAST Result Date: 01/07/2024 CLINICAL DATA:  Code stroke. Left-sided paralysis. History of migraines. EXAM: CT HEAD WITHOUT CONTRAST TECHNIQUE: Contiguous axial images were obtained from the base of the skull through the vertex without intravenous contrast. RADIATION DOSE REDUCTION: This exam was performed according to the departmental dose-optimization program which includes automated exposure control, adjustment of the mA and/or kV according to patient size and/or use of iterative reconstruction technique. COMPARISON:  Report of CT head without contrast 04/28/2005. FINDINGS: Brain: Dense calcifications are present within the globus pallidus bilaterally. No acute infarct, hemorrhage, or mass lesion is present. No significant white matter lesions are present. The deep brain nuclei are otherwise within normal limits. The ventricles are of normal size. No significant extraaxial fluid collection is present. The brainstem and cerebellum are within normal limits. Midline structures are within normal limits. Vascular: No hyperdense  vessel or unexpected calcification. Skull: Calvarium is intact. No focal lytic or blastic lesions are present. No significant extracranial soft tissue lesion is present. Sinuses/Orbits: The paranasal sinuses and mastoid air cells are clear. The globes and orbits are within normal limits. ASPECTS [facility] Stroke Program Early CT Score) - Ganglionic level infarction (caudate, lentiform nuclei, internal capsule, insula, M1-M3 cortex): 7/7 - Supraganglionic infarction (M4-M6 cortex): 3/3 Total score (0-10 with 10 being normal): 10/10 IMPRESSION: 1. No acute intracranial abnormality or significant interval change. 2. Aspects is 10/10. 3. Dense calcifications within the globus pallidus bilaterally. This is a nonspecific finding but can be seen in the setting of Fahr's disease. ASPECTS is Electronically Signed  By: Marin Roberts M.D.   On: 01/07/2024 18:48       The results of significant diagnostics from this hospitalization (including imaging, microbiology, ancillary and laboratory) are listed below for reference.     Microbiology: No results found for this or any previous visit (from the past 240 hours).   Labs:  CBC: Recent Labs  Lab 01/07/24 1749 01/07/24 1847 01/07/24 2328 01/09/24 0739 01/10/24 0513  WBC 12.7*  --  10.9* 7.7 8.4  NEUTROABS 10.2*  --   --   --   --   HGB 14.6 15.0 13.4 14.0 14.7  HCT 43.5 44.0 39.4 41.7 43.9  MCV 85.6  --  85.7 85.8 85.1  PLT 355  --  311 307 336   BMP &GFR Recent Labs  Lab 01/07/24 1749 01/07/24 1847 01/07/24 2231 01/07/24 2328 01/09/24 0739 01/10/24 0513  NA 136 138  --  137 137 135  K 3.7 3.7  --  3.3* 3.7 3.4*  CL 107 108  --  106 108 108  CO2 19*  --   --  20* 20* 19*  GLUCOSE 97 95  --  102* 104* 87  BUN 10 10  --  7 5* 8  CREATININE 0.72 0.70  --  0.67 0.74 0.80  CALCIUM 9.1  --  8.9 8.6* 8.7* 8.7*  MG 1.9  --   --  1.7 2.0 1.9  PHOS  --   --   --  3.1 3.4 3.9   Estimated Creatinine Clearance: 112.1 mL/min (by C-G  formula based on SCr of 0.8 mg/dL). Liver & Pancreas: Recent Labs  Lab 01/07/24 1749 01/09/24 0739 01/10/24 0513  AST 19  --   --   ALT 14  --   --   ALKPHOS 52  --   --   BILITOT 0.7  --   --   PROT 6.9  --   --   ALBUMIN 3.7 3.5 3.7   No results for input(s): "LIPASE", "AMYLASE" in the last 168 hours. No results for input(s): "AMMONIA" in the last 168 hours. Diabetic: No results for input(s): "HGBA1C" in the last 72 hours. Recent Labs  Lab 01/08/24 1014  GLUCAP 91   Cardiac Enzymes: No results for input(s): "CKTOTAL", "CKMB", "CKMBINDEX", "TROPONINI" in the last 168 hours. No results for input(s): "PROBNP" in the last 8760 hours. Coagulation Profile: Recent Labs  Lab 01/07/24 1822  INR 1.0   Thyroid Function Tests: No results for input(s): "TSH", "T4TOTAL", "FREET4", "T3FREE", "THYROIDAB" in the last 72 hours. Lipid Profile: No results for input(s): "CHOL", "HDL", "LDLCALC", "TRIG", "CHOLHDL", "LDLDIRECT" in the last 72 hours. Anemia Panel: Recent Labs    01/09/24 1518  FERRITIN 19  TIBC 566*  IRON 75   Urine analysis:    Component Value Date/Time   COLORURINE YELLOW 01/07/2024 1949   APPEARANCEUR CLEAR 01/07/2024 1949   APPEARANCEUR Clear 09/08/2022 0800   LABSPEC 1.015 01/07/2024 1949   PHURINE 6.0 01/07/2024 1949   GLUCOSEU NEGATIVE 01/07/2024 1949   HGBUR SMALL (A) 01/07/2024 1949   BILIRUBINUR NEGATIVE 01/07/2024 1949   BILIRUBINUR Negative 09/08/2022 0800   KETONESUR 40 (A) 01/07/2024 1949   PROTEINUR NEGATIVE 01/07/2024 1949   UROBILINOGEN 0.2 07/20/2022 1553   UROBILINOGEN 0.2 03/31/2013 2122   NITRITE NEGATIVE 01/07/2024 1949   LEUKOCYTESUR NEGATIVE 01/07/2024 1949   Sepsis Labs: Invalid input(s): "PROCALCITONIN", "LACTICIDVEN"   SIGNED:  Almon Hercules, MD  [facility] 01/12/2024, 12:02 PM

## 2024-01-12 NOTE — H&P (Signed)
 Physical Medicine and Rehabilitation Admission H&P    Chief Complaint  Patient presents with   Paralysis  : HPI: Faith Jordan. 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 Lucia Gaskins, on birth control, angioedema 2017,dysautonomia.  Per chart review patient lives alone.  Independent prior to admission.  Working as a Systems analyst at [employer].  She plans to stay with her mother on discharge.  Presented 01/07/2024 with acute onset of left-sided weakness and facial droop.  She denied any headache.  Cranial CT scan showed no acute intracranial abnormality.  CTA of head and neck unremarkable.  Patient did not receive tPA.  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  Admission chemistries unremarkable except WBC of 12,700, urine drug screen negative.  Beta-2 glycoprotein and lupus anticoagulant negative.  Protein S and C activity and normal Antithrombin III normal.  Echocardiogram with ejection fraction of 60 to 65% no wall motion abnormalities.  TEE completed showing ejection fraction 65% no atrial appendage or thrombus.  She is currently maintained on Plavix for CVA prophylaxis (allergy to NSAIDs thus was not placed on aspirin..  Lovenox for DVT prophylaxis.  She currently remains on Topamax for headaches.  Therapy evaluations completed due to patient decreased functional mobility left-sided weakness was admitted for a comprehensive rehab program.   Pt reports having "muscle jerking in L side"- it's annoying and can get in way of walking/functioning.   She notes she was told not to take Nurtec and to stop Emgality, however Emgality is due 3/28-3/30- and it's the only CGRP that's helped her Hemiplegic migraines.  HA 3/10- not  migraine quite yet.   Has poor appetite since stroke and not getting it back yet.    She also notes gets Botox for TMJ dysfunction.   Had 4 BM's yesterday- were diarrhea per pt- but has improved since got Imodium yesterday.  She reports she started her period, since OCPs were stopped  Said her angioedema was due to Advil, of note.   Review of Systems  Constitutional:  Negative for chills and fever.  HENT:  Negative for hearing loss.   Eyes:  Positive for blurred vision. Negative for double vision.  Respiratory:  Negative for cough, shortness of breath and wheezing.   Cardiovascular:  Negative for chest pain and palpitations.  Gastrointestinal:  Negative for constipation, heartburn, nausea and vomiting.  Genitourinary:  Negative for dysuria, flank pain and hematuria.  Skin:  Negative for rash.  Neurological:  Positive for dizziness, speech change, weakness and headaches.  Psychiatric/Behavioral:  Positive for depression.   All other systems reviewed and are negative.  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   Acute flank pain 06/02/2022   Angio-edema 04/27/2016   Angioedema    Aquagenic angio-edema-urticaria    Asthma    no problems /not used recently   Dysautonomia [facility])    Dysrhythmia    sinus tachycardia   Fibroid    right breast, adenoma   Headache(784.0)    History of COVID-19 11/21/2020   Hives 12/22/2015   Pneumonia    hx  6th grade   Sensation of fullness in both ears 02/02/2023   Snoring 02/11/2022   Urinary frequency 02/12/2019   Vaginal yeast  infection 06/02/2022   Vasculitis [facility])    Past Surgical History:  Procedure Laterality Date   ADENOIDECTOMY     BREAST LUMPECTOMY Right    BREAST LUMPECTOMY Right    MASS EXCISION Right 10/07/2014   Procedure: EXCISION OF RIGHT BREAST MASS;  Surgeon: Avel Peace, MD;  Location: [place] OR;  Service: General;  Laterality: Right;   TONSILLECTOMY AND ADENOIDECTOMY      TOOTH EXTRACTION     TRANSESOPHAGEAL ECHOCARDIOGRAM (CATH LAB) N/A 01/10/2024   Procedure: TRANSESOPHAGEAL ECHOCARDIOGRAM;  Surgeon: Jake Bathe, MD;  Location: [facility] INVASIVE CV LAB;  Service: Cardiovascular;  Laterality: N/A;   Family History  Problem Relation Age of Onset   Depression Mother    Migraines Mother    Narcolepsy Mother    Hypothyroidism Mother    Myasthenia gravis Maternal Grandfather    Breast cancer Maternal Grandmother 33   Asthma Other    Cancer Other    Epilepsy Other    Allergic rhinitis Neg Hx    Angioedema Neg Hx    Atopy Neg Hx    Eczema Neg Hx    Immunodeficiency Neg Hx    Urticaria Neg Hx    Social History:  reports that she has never smoked. She has never used smokeless tobacco. She reports that she does not drink alcohol and does not use drugs. Allergies:  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, blistering peeling skin   Nsaids Dermatitis    Reaction similar to Stephens-Johnson, blistering peeling rash   Sulfa Antibiotics Swelling    Facial swelling   Medications Prior to Admission  Medication Sig Dispense Refill   FLUoxetine (PROZAC) 40 MG capsule Take 1 capsule (40 mg total) by mouth daily. 90 capsule 3   Galcanezumab-gnlm (EMGALITY) 120 MG/ML SOAJ Inject 120 mg into the skin every 30 (thirty) days.     Levonorgestrel-Ethinyl Estradiol (DAYSEE) 0.15-0.03 &0.01 MG tablet Take 1 tablet by mouth daily.     LORazepam (ATIVAN) 0.5 MG tablet Take 1 tablet (0.5 mg total) by mouth 2 (two) times daily as needed for anxiety and panic attacks. 60 tablet 2   Rimegepant Sulfate (NURTEC) 75 MG TBDP Take 1 tablet by mouth daily as needed. Take for abortive therapy of migraine headaches. No more than 1 every 24 hours and no more than 10 per month.     topiramate (TOPAMAX) 50 MG tablet Take 1 tablet (50 mg total) by mouth daily. May also take 1 tablet (50 mg total) daily as needed (daily during menstrual  cycle). 180 tablet 3   traZODone (DESYREL) 50 MG tablet Take 1-3 tablets (50-150 mg total) by mouth at bedtime as needed for sleep. 270 tablet 3      Home: Home Living Family/patient expects to be discharged to:: Inpatient rehab Living Arrangements: Alone Available Help at Discharge: Family, Available 24 hours/day Type of Home: House Home Access: Stairs to enter [employer] of Steps: 3 Entrance Stairs-Rails: Right, Left Home Layout: Multi-level, Able to live on main level with bedroom/bathroom Bathroom Shower/Tub: Health visitor: Standard Bathroom Accessibility: Yes Home Equipment: BSC/3in1, Shower seat Additional Comments: History for mother's house where she would discharge from the hospital. 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 Had multiple falls leading up  to admission ADLs Comments: ind  Functional Status:  Mobility: Bed Mobility Overal bed mobility: Modified Independent Bed Mobility: Supine to Sit Supine to sit: HOB elevated, Used rails, Supervision Sit to supine: Supervision, HOB elevated, Used rails (extra time to elevate LLE onto bed) General bed mobility comments: comes up into long sitting then swings around Transfers Overall transfer level: Needs assistance Equipment used: None Transfers: Sit to/from Stand Sit to Stand: Min assist Bed to/from chair/wheelchair/BSC transfer type:: Step pivot Step pivot transfers: Min assist General transfer comment: STS from EOB Ambulation/Gait Ambulation/Gait assistance: Mod assist, Max assist Gait Distance (Feet): 140 Feet Assistive device: 1 person hand held assist Gait Pattern/deviations: Decreased stride length, Decreased stance time - left, Trendelenburg General Gait Details: Pt ambulated  to nurses station & back without AD with PT supporting LUE throughout gait. Pt presents with LLE genu recurvatum in stance phase. Pt is able to advance LLE with cuing for increased LLE quad & glute activation in stance phase to assist with balance with good return demo from pt. Gait velocity: decreased Pre-gait activities: at rail in hallway working on L hip flexion to initiate swing and through initial contact and bringing back in reverse for strengthening and sequencing/timing    ADL: ADL Overall ADL's : Needs assistance/impaired Eating/Feeding: Supervision/ safety Eating/Feeding Details (indicate cue type and reason): cues to make sure that she sweeps L side of mouth with tonge. pt taking small appropriate bites and self feeding at end of session. Grooming: Wash/dry hands, Modified independent Lower Body Dressing Details (indicate cue type and reason): Worked on crossing one leg over the other and taking off/putting on socks. Able to maintain balance at EOB while doing this, needed A of RUE to cross LLE in "figure 4" fashion and this is currently not able to maintain "figure 4" position on her own with LLE. Held her LLE for her in this position and A'd her with taking sock off and putting back on with hand over hand A for LUE (she can close hand--composite flexion; but cannot maintain grip) as she used both hands to put sock back on--did this also with RLE sock. Toilet Transfer: Minimal assistance General ADL Comments: pt able to take several steps away from bed with OT blocking LLE  Cognition: Cognition Overall Cognitive Status: Impaired/Different from baseline (mild higher level) Arousal/Alertness: Awake/alert Orientation Level: Oriented X4 Year: 2025 Day of Week: Correct Attention: Sustained Sustained Attention: Appears intact Memory: Appears intact Awareness: Appears intact Problem Solving: Impaired (clock drawing) Problem Solving Impairment: Functional basic Behaviors: Other  (comment) (tearful) Safety/Judgment: Appears intact Cognition Arousal: Alert Behavior During Therapy: WFL for tasks assessed/performed Overall Cognitive Status: Impaired/Different from baseline (mild higher level)  Physical Exam: Blood pressure 105/76, pulse 85, temperature 98.3 F (36.8 C), temperature source Oral, resp. rate 18, height 5\' 7"  (1.702 m), weight 77.1 kg, SpO2 96%. Physical Exam Vitals and nursing note reviewed.  Constitutional:      Appearance: Normal appearance. She is normal weight.     Comments: Sitting up in bed with mother and multiple friends at bedside; awake, alert, appropriate, NAD  HENT:     Head: Normocephalic and atraumatic.     Comments: L facial droop- noted at rest and with smile Tongue midline    Right Ear: External ear normal.     Left Ear: External ear normal.     Nose: Nose normal. No congestion.     Mouth/Throat:     Mouth: Mucous membranes are dry.  Pharynx: Oropharynx is clear. No oropharyngeal exudate.  Eyes:     General:        Right eye: No discharge.        Left eye: No discharge.     Extraocular Movements: Extraocular movements intact.     Comments: No nystagmus seen  Cardiovascular:     Rate and Rhythm: Normal rate and regular rhythm.     Heart sounds: Normal heart sounds. No murmur heard.    No gallop.  Pulmonary:     Effort: Pulmonary effort is normal. No respiratory distress.     Breath sounds: Normal breath sounds. No wheezing, rhonchi or rales.  Abdominal:     General: Bowel sounds are normal. There is no distension.     Palpations: Abdomen is soft.     Tenderness: There is no abdominal tenderness.  Musculoskeletal:     Cervical back: Neck supple. No tenderness.     Comments: RUE 5/5 LUE- biceps 3+/5; Triceps 3+/5; WE 3/5; Grip 4-/5; FA 0/5 RLE- 5/5 LLE- HF 4/5; KE- 4+/5; KF 4/5; DF 4 to 4+/5; PF 4+/5   Skin:    General: Skin is warm and dry.     Comments: IV R forearm- looks OK  Neurological:     Mental Status:  She is alert.     Comments: Patient is alert and makes eye contact with examiner.  Follows simple commands.  Oriented x 3 with good insight and awareness.  She is mildly dysarthric. Is working hard on enunciation- when speaking to me, didn't hear dysarthria, heard slowed speech due to working harder to speak clearly, but near end of interview, very mild dysarthria noted as got tired 4-5 beats clonus LLE No hoffman's LUE No increased tone in LUE, but MAS of 1 in Hip, and knee and ankle on LLE Intact to light touch in all 4 extremities and face  Psychiatric:        Mood and Affect: Mood normal.        Behavior: Behavior normal.     Comments: Slightly anxious- about recovery     No results found for this or any previous visit (from the past 48 hours). ECHO TEE Result Date: 01/10/2024    TRANSESOPHOGEAL ECHO REPORT   Patient Name:   GRAYCEE GREESON Date of Exam: 01/10/2024 Medical Rec #:  [id number]      Height:       67.0 in Accession #:    [id number]     Weight:       170.0 lb Date of Birth:  [birth year]      BSA:          1.887 m Patient Age:    28 years       BP:           139/85 mmHg Patient Gender: F              HR:           96 bpm. Exam Location:  Inpatient Procedure: Transesophageal Echo, Color Doppler and Cardiac Doppler (Both            Spectral and Color Flow Doppler were utilized during procedure). Indications:     Stroke  History:         Patient has prior history of Echocardiogram examinations, most                  recent 01/08/2024.  Sonographer:     Harriette Bouillon RDCS Referring  Phys:  Boyce Medici GONFA Diagnosing Phys: Donato Schultz MD PROCEDURE: The transesophogeal probe was passed without difficulty through the esophogus of the patient. Sedation performed by different physician. The patient's vital signs; including heart rate, blood pressure, and oxygen saturation; remained stable throughout the procedure. The patient developed no complications during the procedure.  IMPRESSIONS  1. Left  ventricular ejection fraction, by estimation, is 60 to 65%. The left ventricle has normal function. The left ventricle has no regional wall motion abnormalities.  2. Right ventricular systolic function is normal. The right ventricular size is normal.  3. No left atrial/left atrial appendage thrombus was detected.  4. The mitral valve is normal in structure. No evidence of mitral valve regurgitation. No evidence of mitral stenosis.  5. The aortic valve is normal in structure. Aortic valve regurgitation is not visualized. No aortic stenosis is present.  6. The inferior vena cava is normal in size with greater than 50% respiratory variability, suggesting right atrial pressure of 3 mmHg.  7. Agitated saline contrast bubble study was negative, with no evidence of any interatrial shunt. Conclusion(s)/Recommendation(s): Normal biventricular function without evidence of hemodynamically significant valvular heart disease. FINDINGS  Left Ventricle: Left ventricular ejection fraction, by estimation, is 60 to 65%. The left ventricle has normal function. The left ventricle has no regional wall motion abnormalities. The left ventricular internal cavity size was normal in size. There is  no left ventricular hypertrophy. Right Ventricle: The right ventricular size is normal. No increase in right ventricular wall thickness. Right ventricular systolic function is normal. Left Atrium: Left atrial size was normal in size. No left atrial/left atrial appendage thrombus was detected. Right Atrium: Right atrial size was normal in size. Pericardium: There is no evidence of pericardial effusion. Mitral Valve: The mitral valve is normal in structure. No evidence of mitral valve regurgitation. No evidence of mitral valve stenosis. Tricuspid Valve: The tricuspid valve is normal in structure. Tricuspid valve regurgitation is not demonstrated. No evidence of tricuspid stenosis. Aortic Valve: The aortic valve is normal in structure. Aortic valve  regurgitation is not visualized. No aortic stenosis is present. Pulmonic Valve: The pulmonic valve was normal in structure. Pulmonic valve regurgitation is not visualized. No evidence of pulmonic stenosis. Aorta: The aortic root is normal in size and structure. Venous: The inferior vena cava is normal in size with greater than 50% respiratory variability, suggesting right atrial pressure of 3 mmHg. IAS/Shunts: There is redundancy of the interatrial septum. No atrial level shunt detected by color flow Doppler. Agitated saline contrast bubble study was negative, with no evidence of any interatrial shunt. There is no evidence of a patent foramen ovale. There is no evidence of an atrial septal defect. Donato Schultz MD Electronically signed by Donato Schultz MD Signature Date/Time: 01/10/2024/12:30:28 PM    Final    EP STUDY Result Date: 01/10/2024 See surgical note for result.     Blood pressure 105/76, pulse 85, temperature 98.3 F (36.8 C), temperature source Oral, resp. rate 18, height 5\' 7"  (1.702 m), weight 77.1 kg, SpO2 96%.  Medical Problem List and Plan: 1. 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  Antithrombotics: -DVT/anticoagulation:  Pharmaceutical: Lovenox  -antiplatelet therapy: Plavix 75 mg daily.  Patient with allergy to NSAIDs thus was not placed on aspirin 3. Pain Management: Lido Derm patch 4. Mood/Behavior/Sleep: Prozac 40  mg daily, melatonin 5 mg nightly, Ativan 1 mg every 6 hours as needed anxiety  -antipsychotic agents: N/A 5. Neuropsych/cognition: This patient is capable of making decisions on her own behalf. 6. Skin/Wound Care: Routine skin check 7. Fluids/Electrolytes/Nutrition: Routine in and outs with follow-up chemistries 8.  Current History of hemiplegic migraines.  Followed by neurology  service Dr. Lucia Gaskins.  Continue Topamax- per pt, took off Emgality (which is due ~3/28) as well as Nurtec- she's wondering what can take when has migraine 9.  Hyperlipidemia.  Lipitor 10. Spasticity  3/27- developing in LLE already- educated on spasticity - that can progress for up to 1 year- and that can impair function- suggest starting baclofen if it's impairing function or becomes painful.  11. 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  Additionally, I have personally reviewed laboratory data, imaging studies, as well as relevant notes and concur with the physician assistant's documentation above.   The patient's status has not changed from the original H&P.  Any changes in documentation from the acute care chart have been noted above.

## 2024-01-12 NOTE — Progress Notes (Signed)
 There is a bed available for pt in CIR today. Dr. Alanda Slim aware and in agreement. Pt, pt's mother Tammy, NSG and TOC made aware.    Wolfgang Phoenix, MS, CCC-SLP Admissions Coordinator [phone]

## 2024-01-12 NOTE — Progress Notes (Signed)
 INPATIENT REHABILITATION ADMISSION NOTE   Arrival Method: bed     Mental Orientation: Oriented X4   Assessment: done   Skin: assessed   IV'S: none   Pain: none   Tubes and Drains: none   Safety Measures: reviewed with pt and family   Vital Signs: done   Height and Weight: done   Rehab Orientation: done   Family: at bedside    Notes: done  Marylu Lund, RN

## 2024-01-12 NOTE — Discharge Summary (Signed)
 Physician Discharge Summary  Patient ID: Faith Jordan MRN: [id number] DOB/AGE: 14-Aug-1996 28 y.o.  Admit date: 01/12/2024 Discharge date: 01/28/2024  Discharge Diagnoses:  Principal Problem:   Stroke of right basal ganglia [facility]) Active Problems:   Cerebrovascular accident (CVA) of right basal ganglia (HCC) DVT prophylaxis History of hemiplegic migraines Hyperlipidemia TMJ dysfunction Mood stabilization  Discharged Condition: Stable  Significant Diagnostic Studies: ECHO TEE Result Date: 01/10/2024    TRANSESOPHOGEAL ECHO REPORT   Patient Name:   Faith Jordan Date of Exam: 01/10/2024 Medical Rec #:  [id number]      Height:       67.0 in Accession #:    [id number]     Weight:       170.0 lb Date of Birth:  06-22-1996      BSA:          1.887 m Patient Age:    28 years       BP:           139/85 mmHg Patient Gender: F              HR:           96 bpm. Exam Location:  Inpatient Procedure: Transesophageal Echo, Color Doppler and Cardiac Doppler (Both            Spectral and Color Flow Doppler were utilized during procedure). Indications:     Stroke  History:         Patient has prior history of Echocardiogram examinations, most                  recent 01/08/2024.  Sonographer:     Harriette Bouillon RDCS Referring Phys:  Almon Hercules Diagnosing Phys: Donato Schultz MD PROCEDURE: The transesophogeal probe was passed without difficulty through the esophogus of the patient. Sedation performed by different physician. The patient's vital signs; including heart rate, blood pressure, and oxygen saturation; remained stable throughout the procedure. The patient developed no complications during the procedure.  IMPRESSIONS  1. Left ventricular ejection fraction, by estimation, is 60 to 65%. The left ventricle has normal function. The left ventricle has no regional wall motion abnormalities.  2. Right ventricular systolic function is normal. The right ventricular size is normal.  3. No left atrial/left atrial appendage  thrombus was detected.  4. The mitral valve is normal in structure. No evidence of mitral valve regurgitation. No evidence of mitral stenosis.  5. The aortic valve is normal in structure. Aortic valve regurgitation is not visualized. No aortic stenosis is present.  6. The inferior vena cava is normal in size with greater than 50% respiratory variability, suggesting right atrial pressure of 3 mmHg.  7. Agitated saline contrast bubble study was negative, with no evidence of any interatrial shunt. Conclusion(s)/Recommendation(s): Normal biventricular function without evidence of hemodynamically significant valvular heart disease. FINDINGS  Left Ventricle: Left ventricular ejection fraction, by estimation, is 60 to 65%. The left ventricle has normal function. The left ventricle has no regional wall motion abnormalities. The left ventricular internal cavity size was normal in size. There is  no left ventricular hypertrophy. Right Ventricle: The right ventricular size is normal. No increase in right ventricular wall thickness. Right ventricular systolic function is normal. Left Atrium: Left atrial size was normal in size. No left atrial/left atrial appendage thrombus was detected. Right Atrium: Right atrial size was normal in size. Pericardium: There is no evidence of pericardial effusion. Mitral Valve: The mitral valve is normal in  structure. No evidence of mitral valve regurgitation. No evidence of mitral valve stenosis. Tricuspid Valve: The tricuspid valve is normal in structure. Tricuspid valve regurgitation is not demonstrated. No evidence of tricuspid stenosis. Aortic Valve: The aortic valve is normal in structure. Aortic valve regurgitation is not visualized. No aortic stenosis is present. Pulmonic Valve: The pulmonic valve was normal in structure. Pulmonic valve regurgitation is not visualized. No evidence of pulmonic stenosis. Aorta: The aortic root is normal in size and structure. Venous: The inferior vena cava  is normal in size with greater than 50% respiratory variability, suggesting right atrial pressure of 3 mmHg. IAS/Shunts: There is redundancy of the interatrial septum. No atrial level shunt detected by color flow Doppler. Agitated saline contrast bubble study was negative, with no evidence of any interatrial shunt. There is no evidence of a patent foramen ovale. There is no evidence of an atrial septal defect. Donato Schultz MD Electronically signed by Donato Schultz MD Signature Date/Time: 01/10/2024/12:30:28 PM    Final    EP STUDY Result Date: 01/10/2024 See surgical note for result.  VAS [place] TRANSCRANIAL DOPPLER W BUBBLES Result Date: 01/09/2024  Transcranial Doppler with Bubble Patient Name:  Faith Jordan  Date of Exam:   01/08/2024 Medical Rec #: [id number]       Accession #:    [id number] Date of Birth: Mar 13, 1996       Patient Gender: F Patient Age:   67 years Exam Location:  [facility] Procedure:      VAS [place] TRANSCRANIAL DOPPLER W BUBBLES Referring Phys: Gevena Mart --------------------------------------------------------------------------------  Indications: Stroke. History: Left side weakness, bilateral tingling of arms, aphasia. History of hemiplegic migraines. Limitations for diagnostic windows: Unable to insonate right transtemporal window. Comparison Study: No prior study Performing Technologist: Sherren Kerns RVS  Examination Guidelines: A complete evaluation includes B-mode imaging, spectral Doppler, color Doppler, and power Doppler as needed of all accessible portions of each vessel. Bilateral testing is considered an integral part of a complete examination. Limited examinations for reoccurring indications may be performed as noted.  Summary: No HITS at rest or during Valsalva. Negative transcranial Doppler Bubble study with no evidence of right to left intracardiac communication.  A vascular evaluation was performed. The right middle cerebral artery was studied. An IV was inserted into the  patient's left Forearm. Verbal informed consent was obtained.  Negative TCD Bubble study *See table(s) above for TCD measurements and observations.  Diagnosing physician: Delia Heady MD Electronically signed by Delia Heady MD on 01/09/2024 at 1:34:41 PM.    Final    VAS [place] LOWER EXTREMITY VENOUS (DVT) Result Date: 01/08/2024  Lower Venous DVT Study Patient Name:  Faith Jordan  Date of Exam:   01/08/2024 Medical Rec #: [id number]       Accession #:    [id number] Date of Birth: 1996/07/12       Patient Gender: F Patient Age:   81 years Exam Location:  [facility] Procedure:      VAS [place] LOWER EXTREMITY VENOUS (DVT) Referring Phys: Gevena Mart --------------------------------------------------------------------------------  Indications: Stroke.  Limitations: Depth of vessels. Comparison Study: No prior study on file Performing Technologist: Sherren Kerns RVS  Examination Guidelines: A complete evaluation includes B-mode imaging, spectral Doppler, color Doppler, and power Doppler as needed of all accessible portions of each vessel. Bilateral testing is considered an integral part of a complete examination. Limited examinations for reoccurring indications may be performed as noted. The reflux portion of the exam is performed  with the patient in reverse Trendelenburg.  +---------+---------------+---------+-----------+----------+--------------+ RIGHT    CompressibilityPhasicitySpontaneityPropertiesThrombus Aging +---------+---------------+---------+-----------+----------+--------------+ CFV      Full           Yes      Yes                                 +---------+---------------+---------+-----------+----------+--------------+ SFJ      Full                                                        +---------+---------------+---------+-----------+----------+--------------+ FV Prox  Full                                                         +---------+---------------+---------+-----------+----------+--------------+ FV Mid   Full                                                        +---------+---------------+---------+-----------+----------+--------------+ FV DistalFull           Yes      Yes                                 +---------+---------------+---------+-----------+----------+--------------+ PFV      Full                                                        +---------+---------------+---------+-----------+----------+--------------+ POP      Full           Yes      Yes                                 +---------+---------------+---------+-----------+----------+--------------+ PTV      Full                                                        +---------+---------------+---------+-----------+----------+--------------+ PERO     Full                                                        +---------+---------------+---------+-----------+----------+--------------+ Gastroc  Full                                                        +---------+---------------+---------+-----------+----------+--------------+   +---------+---------------+---------+-----------+----------+-------------------+  LEFT     CompressibilityPhasicitySpontaneityPropertiesThrombus Aging      +---------+---------------+---------+-----------+----------+-------------------+ CFV      Full           Yes      Yes                                      +---------+---------------+---------+-----------+----------+-------------------+ SFJ      Full                                                             +---------+---------------+---------+-----------+----------+-------------------+ FV Prox  Full                                                             +---------+---------------+---------+-----------+----------+-------------------+ FV Mid   Full                                                              +---------+---------------+---------+-----------+----------+-------------------+ FV DistalFull           Yes      Yes                                      +---------+---------------+---------+-----------+----------+-------------------+ PFV      Full                                                             +---------+---------------+---------+-----------+----------+-------------------+ POP                     Yes      Yes                  patent by color and                                                       Doppler             +---------+---------------+---------+-----------+----------+-------------------+ PTV      Full                                                             +---------+---------------+---------+-----------+----------+-------------------+ PERO     Full                                                             +---------+---------------+---------+-----------+----------+-------------------+  Summary: BILATERAL: - No evidence of deep vein thrombosis seen in the lower extremities, bilaterally. -No evidence of popliteal cyst, bilaterally.   *See table(s) above for measurements and observations. Electronically signed by Lemar Livings MD on 01/08/2024 at 5:39:09 PM.    Final    ECHOCARDIOGRAM COMPLETE Result Date: 01/08/2024    ECHOCARDIOGRAM REPORT   Patient Name:   Faith Jordan Date of Exam: 01/08/2024 Medical Rec #:  [id number]      Height:       67.0 in Accession #:    [id number]     Weight:       170.0 lb Date of Birth:  12/14/1995      BSA:          1.887 m Patient Age:    28 years       BP:           132/74 mmHg Patient Gender: F              HR:           87 bpm. Exam Location:  Inpatient Procedure: 2D Echo, Color Doppler and Cardiac Doppler (Both Spectral and Color            Flow Doppler were utilized during procedure). Indications:    Stroke  History:        Patient has prior history of Echocardiogram examinations, most                  recent 02/10/2021.  Sonographer:    Rosaland Lao Referring Phys: [id number] CAROLE N HALL IMPRESSIONS  1. Left ventricular ejection fraction, by estimation, is 60 to 65%. The left ventricle has normal function. The left ventricle has no regional wall motion abnormalities. Left ventricular diastolic parameters were normal.  2. Right ventricular systolic function is normal. The right ventricular size is normal.  3. The mitral valve is normal in structure. Trivial mitral valve regurgitation. No evidence of mitral stenosis.  4. The aortic valve is normal in structure. Aortic valve regurgitation is not visualized. No aortic stenosis is present.  5. The inferior vena cava is normal in size with greater than 50% respiratory variability, suggesting right atrial pressure of 3 mmHg. Conclusion(s)/Recommendation(s): Normal biventricular function without evidence of hemodynamically significant valvular heart disease. FINDINGS  Left Ventricle: Left ventricular ejection fraction, by estimation, is 60 to 65%. The left ventricle has normal function. The left ventricle has no regional wall motion abnormalities. The left ventricular internal cavity size was normal in size. There is  no left ventricular hypertrophy. Left ventricular diastolic parameters were normal. Right Ventricle: The right ventricular size is normal. No increase in right ventricular wall thickness. Right ventricular systolic function is normal. Left Atrium: Left atrial size was normal in size. Right Atrium: Right atrial size was normal in size. Pericardium: There is no evidence of pericardial effusion. Mitral Valve: The mitral valve is normal in structure. Trivial mitral valve regurgitation. No evidence of mitral valve stenosis. Tricuspid Valve: The tricuspid valve is normal in structure. Tricuspid valve regurgitation is not demonstrated. No evidence of tricuspid stenosis. Aortic Valve: The aortic valve is normal in structure. Aortic valve regurgitation is not  visualized. No aortic stenosis is present. Pulmonic Valve: The pulmonic valve was normal in structure. Pulmonic valve regurgitation is not visualized. No evidence of pulmonic stenosis. Aorta: The aortic root is normal in size and structure. Venous: The inferior vena cava is normal in size with greater than 50% respiratory variability, suggesting right  atrial pressure of 3 mmHg. IAS/Shunts: No atrial level shunt detected by color flow Doppler.  LEFT VENTRICLE PLAX 2D LVIDd:         4.20 cm   Diastology LVIDs:         2.60 cm   LV e' medial:    11.90 cm/s LV PW:         0.70 cm   LV E/e' medial:  8.0 LV IVS:        0.80 cm   LV e' lateral:   17.00 cm/s LVOT diam:     1.90 cm   LV E/e' lateral: 5.6 LV SV:         48 LV SV Index:   26 LVOT Area:     2.84 cm  RIGHT VENTRICLE             IVC RV Basal diam:  3.00 cm     IVC diam: 1.10 cm RV S prime:     13.90 cm/s TAPSE (M-mode): 2.0 cm LEFT ATRIUM             Index        RIGHT ATRIUM          Index LA diam:        3.20 cm 1.70 cm/m   RA Area:     9.40 cm LA Vol (A2C):   32.4 ml 17.17 ml/m  RA Volume:   17.60 ml 9.33 ml/m LA Vol (A4C):   31.0 ml 16.43 ml/m LA Biplane Vol: 31.6 ml 16.74 ml/m  AORTIC VALVE LVOT Vmax:   84.60 cm/s LVOT Vmean:  57.700 cm/s LVOT VTI:    0.170 m  AORTA Ao Root diam: 2.20 cm Ao Asc diam:  2.40 cm MITRAL VALVE MV Area (PHT): 5.27 cm    SHUNTS MV Decel Time: 144 msec    Systemic VTI:  0.17 m MV E velocity: 95.00 cm/s  Systemic Diam: 1.90 cm MV A velocity: 62.20 cm/s MV E/A ratio:  1.53 Donato Schultz MD Electronically signed by Donato Schultz MD Signature Date/Time: 01/08/2024/10:00:37 AM    Final    MR BRAIN W WO CONTRAST Result Date: 01/07/2024 CLINICAL DATA:  New onset left-sided weakness. EXAM: MRI HEAD WITHOUT AND WITH CONTRAST TECHNIQUE: Multiplanar, multiecho pulse sequences of the brain and surrounding structures were obtained without and with intravenous contrast. CONTRAST:  8mL GADAVIST GADOBUTROL 1 MMOL/ML IV SOLN COMPARISON:  CT  head without contrast and CT angio head and neck 01/07/2024. FINDINGS: Brain: A 10 mm acute nonhemorrhagic infarct is present in the posterior limb of the right internal capsule. T2 and FLAIR hyperintensities are consistent with the time frame nearly 24 hours. No acute hemorrhage or mass lesion is present. Susceptibility related calcifications in the globus pallidus noted bilaterally. Basal ganglia are otherwise within normal limits. The ventricles are of normal size. No significant extraaxial fluid collection is present. The brainstem and cerebellum are within normal limits. Midline structures are within normal limits. Vascular: Flow is present in the major intracranial arteries. Skull and upper cervical spine: The craniocervical junction is normal. Upper cervical spine is within normal limits. Marrow signal is unremarkable. Sinuses/Orbits: The paranasal sinuses and mastoid air cells are clear. The globes and orbits are within normal limits. IMPRESSION: 1. 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 2. Susceptibility related calcifications in the globus pallidus bilaterally. This likely reflects the sequela of chronic microvascular ischemia. The above was relayed via text pager  to Dr. Wilford Corner on 01/07/2024 at 20:52 . Electronically Signed   By: Marin Roberts M.D.   On: 01/07/2024 20:55   CT ANGIO HEAD NECK W WO CM (CODE STROKE) Result Date: 01/07/2024 CLINICAL DATA:  Left hemiplegia. EXAM: CT ANGIOGRAPHY HEAD AND NECK WITH AND WITHOUT CONTRAST TECHNIQUE: Multidetector CT imaging of the head and neck was performed using the standard protocol during bolus administration of intravenous contrast. Multiplanar CT image reconstructions and MIPs were obtained to evaluate the vascular anatomy. Carotid stenosis measurements (when applicable) are obtained utilizing NASCET criteria, using the distal internal carotid diameter as the denominator. RADIATION DOSE REDUCTION: This  exam was performed according to the departmental dose-optimization program which includes automated exposure control, adjustment of the mA and/or kV according to patient size and/or use of iterative reconstruction technique. CONTRAST:  75mL OMNIPAQUE IOHEXOL 350 MG/ML SOLN COMPARISON:  CT head without contrast 01/07/2024 at 6:41 p.m. FINDINGS: CTA NECK FINDINGS Aortic arch: A 3 vessel arch configuration is present. No significant atherosclerotic changes are present at the aortic arch. Great vessel origins are widely patent. No aneurysm or dissection is present. Right carotid system: The right common carotid artery is within normal limits. Bifurcation is within normal limits. The cervical right ICA is normal. Left carotid system: The left common carotid artery is within normal limits. Bifurcation is unremarkable. Cervical left ICA is normal. Vertebral arteries: The vertebral arteries are codominant. Both vertebral arteries originate from the subclavian arteries without significant stenosis. No significant stenosis is present in either vertebral artery in the neck. Skeleton: The vertebral body heights and alignment are normal. Mild reversal of the normal cervical lordosis may be positional. No focal osseous lesions are present. Other neck: The soft tissues of the neck are otherwise unremarkable. Salivary glands are within normal limits. Thyroid is normal. No significant adenopathy is present. No focal mucosal or submucosal lesions are present. Upper chest: The lung apices are clear. The thoracic inlet is within normal limits. Review of the MIP images confirms the above findings CTA HEAD FINDINGS Anterior circulation: Internal carotid arteries are within normal limits from the skull base to the ICA termini. The A1 and M1 segments are normal. The anterior communicating artery is patent. MCA bifurcations are normal bilaterally. The ACA and MCA branch vessels are normal bilaterally. No aneurysm is present. Posterior  circulation: PICA origins are visualized and normal. The vertebrobasilar junction and basilar artery are normal. The superior cerebellar arteries are patent bilaterally. Both posterior cerebral arteries originate from basilar tip. Small posterior communicating arteries are present. The PCA branch vessels are normal bilaterally. No aneurysm is present. Venous sinuses: The dural sinuses are patent. The straight sinus and deep cerebral veins are intact. Cortical veins are within normal limits. No significant vascular malformation is evident. Anatomic variants: None Review of the MIP images confirms the above findings IMPRESSION: 1. Normal CTA of the neck. 2. Normal CTA Circle of Willis without significant proximal stenosis, aneurysm, or branch vessel occlusion. These results were called by telephone at the time of interpretation on 01/07/2024 at 7:00 pm to provider ERIC [facility] , who verbally acknowledged these results. Electronically Signed   By: Marin Roberts M.D.   On: 01/07/2024 19:05   CT HEAD CODE STROKE WO CONTRAST Result Date: 01/07/2024 CLINICAL DATA:  Code stroke. Left-sided paralysis. History of migraines. EXAM: CT HEAD WITHOUT CONTRAST TECHNIQUE: Contiguous axial images were obtained from the base of the skull through the vertex without intravenous contrast. RADIATION DOSE REDUCTION: This exam was performed according  to the departmental dose-optimization program which includes automated exposure control, adjustment of the mA and/or kV according to patient size and/or use of iterative reconstruction technique. COMPARISON:  Report of CT head without contrast 04/28/2005. FINDINGS: Brain: Dense calcifications are present within the globus pallidus bilaterally. No acute infarct, hemorrhage, or mass lesion is present. No significant white matter lesions are present. The deep brain nuclei are otherwise within normal limits. The ventricles are of normal size. No significant extraaxial fluid collection is  present. The brainstem and cerebellum are within normal limits. Midline structures are within normal limits. Vascular: No hyperdense vessel or unexpected calcification. Skull: Calvarium is intact. No focal lytic or blastic lesions are present. No significant extracranial soft tissue lesion is present. Sinuses/Orbits: The paranasal sinuses and mastoid air cells are clear. The globes and orbits are within normal limits. ASPECTS [facility] Stroke Program Early CT Score) - Ganglionic level infarction (caudate, lentiform nuclei, internal capsule, insula, M1-M3 cortex): 7/7 - Supraganglionic infarction (M4-M6 cortex): 3/3 Total score (0-10 with 10 being normal): 10/10 IMPRESSION: 1. No acute intracranial abnormality or significant interval change. 2. Aspects is 10/10. 3. Dense calcifications within the globus pallidus bilaterally. This is a nonspecific finding but can be seen in the setting of Fahr's disease. ASPECTS is Electronically Signed   By: Marin Roberts M.D.   On: 01/07/2024 18:48    Labs:  Basic Metabolic Panel: Recent Labs  Lab 01/07/24 1749 01/07/24 1847 01/07/24 2231 01/07/24 2328 01/09/24 0739 01/10/24 0513  NA 136 138  --  137 137 135  K 3.7 3.7  --  3.3* 3.7 3.4*  CL 107 108  --  106 108 108  CO2 19*  --   --  20* 20* 19*  GLUCOSE 97 95  --  102* 104* 87  BUN 10 10  --  7 5* 8  CREATININE 0.72 0.70  --  0.67 0.74 0.80  CALCIUM 9.1  --  8.9 8.6* 8.7* 8.7*  MG 1.9  --   --  1.7 2.0 1.9  PHOS  --   --   --  3.1 3.4 3.9    CBC: Recent Labs  Lab 01/07/24 1749 01/07/24 1847 01/07/24 2328 01/09/24 0739 01/10/24 0513  WBC 12.7*  --  10.9* 7.7 8.4  NEUTROABS 10.2*  --   --   --   --   HGB 14.6   < > 13.4 14.0 14.7  HCT 43.5   < > 39.4 41.7 43.9  MCV 85.6  --  85.7 85.8 85.1  PLT 355  --  311 307 336   < > = values in this interval not displayed.    CBG: Recent Labs  Lab 01/08/24 1014  GLUCAP 91   Family history.  Mother with depression migraines narcolepsy and  hypothyroidism.  Denies any rectal cancer esophageal cancer or stomach cancer  Brief HPI:   Faith Jordan is a 28 y.o. right-handed female with history significant for familiar hemiplegic migraines on Nurtec and Emgality followed by neurology services Dr.Ahern, on birth control, angioedema 2017, dysautonomia.  Per chart review lives alone independent prior to admission working as an ED nurse at [employer].  She plans to stay at her mother's on discharge.  Presented 01/07/2024 with acute onset of left-sided weakness and facial droop.  She denied any headache.  Cranial CT scan showed no acute intracranial abnormality.  CTA of the head and neck unremarkable.  Patient did not receive tPA.  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  Admission chemistries unremarkable except WBC 12,700, urine drug screen negative.  Beta-2 glycoprotein and lupus anticoagulant negative.  Protein S and C activity and normal Antithrombin III normal.  Echocardiogram with ejection fraction of 60 to 65% no wall motion abnormalities.  TEE completed showing ejection fraction 65% no atrial appendage or thrombus.  Maintained on Plavix for CVA prophylaxis allergies to NSAIDs thus was not placed on aspirin.  Lovenox added for DVT prophylaxis.  She was currently on Topamax for her headaches.  Therapy evaluations completed due to patient decreased functional mobility left-sided weakness was admitted for a comprehensive rehab program.   Hospital Course: Faith Jordan was admitted to rehab 01/12/2024 for inpatient therapies to consist of PT, ST and OT at least three hours five days a week. Past admission physiatrist, therapy team and rehab RN have worked together to provide customized collaborative inpatient rehab.  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  She continued to progress nicely with therapies followed by neurology services.  Maintained on Lovenox for DVT prophylaxis.  Plavix ongoing for CVA prophylaxis no aspirin due to NSAID allergy.  Mood stabilization with the use of Prozac as well as melatonin.  She was using Ativan only as needed for for anxiety.  In regards to patient's history of hemiplegic migraine she was followed by neurology services Dr.Ahern currently maintained on Topamax.  She had been on Nurtec as well as Emgality prior to admission.  Lipitor ongoing for hyperlipidemia.  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   Blood pressures were monitored on TID basis and controlled monitored     Rehab course: During patient's stay in rehab weekly team conferences were held to monitor patient's progress, set goals and discuss barriers to discharge. At admission, patient required mod max assist 140 feet 1 person hand-held assist minimal assist step pivot transfers  Physical exam.  Blood pressure 105/76 pulse 85 temperature 98.3 respirations 18 oxygen saturation is 96% room air Constitutional.  No acute distress HEENT Head.  Normocephalic and atraumatic Eyes.  Pupils round and reactive to light no discharge without nystagmus Neck.  Supple nontender no JVD without thyromegaly Cardiac regular rate and rhythm without any extra sounds or murmur heard Abdomen.  Soft nontender positive bowel sounds without rebound Respiratory effort normal no respiratory distress without wheeze Skin.  Warm dry and intact Musculoskeletal Comments.  Right upper extremity 5/5 Left upper extremity biceps 3+/5 triceps 3+/5W EE 3/5 grip 4 -/5 FA 0/5 Right lower extremity 5/5 Left lower extremity hip flexors 4/5 knee extension 4+/5 knee flexion 4/5 dorsiflexion 4 to 4+/5, PF 4+/5 Neurologic.  Patient alert makes eye contact with examiner.  Follows  simple commands.  Oriented x 3 with good insight and awareness.  Mildly dysarthric.   He/She  has had improvement in activity tolerance, balance, postural control as well as ability to compensate for deficits. He/She has had improvement in functional use RUE/LUE  and RLE/LLE as well as improvement in awareness.  Bed mobility performed supine to sit with supervision/modified independent.  No cueing required.  Perform sit to stand and stand pivot transfers throughout sessions with good awareness.  Ambulates 200 feet using no assistive device close supervision.  She progressed to ambulate through agility ladder able to complete stepping 1 foot  into each square x 4.  She completed amatory transfers to the bathroom no assistive device completed 3/3 toileting task contact-guard assist device with continent void.  Ambulates to the sink completed oral hygiene task in standing position for increased balance and challenge.  Use of e-stim on to patient's left dorsal forearm and tricep to activate wrist/digit extension to facilitate elbow extension with noted activation.  She completed functional reaching task grasping washcloth and bringing it to her face.  Minimal assist required overall elbow and wrist.  SLP initiated use of [place] oral performance instrument target lingual strength in order to increase articulatory precision with steady gains.  Patient with 100% intelligibility with modified independent use of compensatory strategies.  Full family teaching completed plan discharge to home.       Disposition:  There are no questions and answers to display.         Diet: Regular  Special Instructions: No driving smoking or alcohol  Medications at discharge. 1.  Tylenol as needed 2.  Risaquad 1 capsule daily 3.  Lipitor 80 mg p.o. daily 4.  Plavix 75 mg p.o. daily 5.  Prozac 40 mg p.o. daily 6.  Lidoderm patch changes directed 7.  Ativan 1 mg every 6 hours as needed anxiety 8.  Trazodone 100 mg p.o.  nightly 9.  Topamax 50 mg daily  30-35 minutes were spent completing discharge summary and discharge planning  Discharge Instructions     Ambulatory referral to Neurology   Complete by: As directed    An appointment is requested in approximately: 4 weeks right internal capsule infarct        Follow-up Information     Kirsteins, Victorino Sparrow, MD Follow up.   Specialty: Physical Medicine and Rehabilitation Why: office to call for appointment Contact information: [address] [place] [address] [phone]         Anson Fret, MD Follow up.   Specialty: Neurology Why: call for appointment Contact information: [address] [place] [address] [phone]                 Signed: Charlton Amor 01/12/2024, 6:17 PM

## 2024-01-12 NOTE — Progress Notes (Addendum)
Signed     Expand All Collapse All PMR Admission Coordinator Pre-Admission Assessment   Patient: Faith Jordan is an 28 y.o., female MRN: [id number] DOB: [birth year] Height: 5\' 7"  (170.2 cm) Weight: 77.1 kg   Insurance Information HMO:     PPO: yes      PCP:      IPA:      80/20:      OTHER:  PRIMARY:  Gretta Began PPO      Policy#: [id number], group: [id number]      Subscriber: patient CM Name: Vickey Huger      Phone#: [phone]      Fax#: [phone] Pre-Cert#: [id number] Received insurance authorization on 01/11/24. Pt approved from 01/11/24-01/21/24. Pt approved for 11 days. Updates due on 01/22/24.    Employer:  Benefits:  Phone #: [phone]     Name:  Dolores Hoose Date: 10/19/2023 - still active Deductible: $300 ($106.05 met) OOP Max: $7,900 ($161.05 met) CIR: 80% coverage; 20% co-insurance SNF: 80% coverage; 20% co-insurance Outpatient: 80% coverage; 20% co-insurance Home Health:  80% coverage; 20% co-insurance DME: 80% coverage; 20% co-insurance Providers: in network   SECONDARY: None      Policy#:      Phone#:     Artist:       Phone#:    The Data processing manager" for patients in Inpatient Rehabilitation Facilities with attached "Privacy Act Statement-Health Care Records" was provided and verbally reviewed with: N/A   Emergency Contact Information Contact Information       Name Relation Home Work Mobile    Novick,Tammy Mother [phone]   [phone]    Renzulli,Dwight Father [phone]             Other Contacts   None on File        Current Medical History  Patient Admitting Diagnosis: CVA History of Present Illness: EVOLA HOLLIS is a 28 y.o. 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  Last known well at 8:00 PM on 01/06/2024.  She initially thought  her left-sided weakness were related to her hemiplegic migraine.  However, they usually involve her face only and not other parts of her body.  She took her migraine medications, but her hemiaplasia persisted so she came to the ED. In the ER, noncontrast head CT showed no acute intracranial abnormality, dense calcification within the globus pallidus bilaterally, nonspecific finding that can be seen in the setting of Fahr's disease.  CT angio head and neck showing no evidence of LVO.  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 CTA head & neck Normal CTA of the neck. Normal CTA Circle of Willis without significant proximal stenosis, aneurysm, or branch vessel occlusion.as [place] TCD Bubble negative for right-to-left shunt, Vas [place] LE negative for DVT, Hypercoagulable panel negative so far and ANA negative 2D Echo EF 60-65%.  Left atrial size normal. TEE normal. Pt. Seen by PT/OT and they recommended CIR to to assist return to PLOF.       Complete NIHSS TOTAL: 6   Patient's medical  record from [facility] has been reviewed by the rehabilitation admission coordinator and physician.   Past Medical History      Past Medical History:  Diagnosis Date   Acne     Acquired breast deformity 12/22/2015   Acute flank pain 06/02/2022   Angio-edema 04/27/2016   Angioedema     Aquagenic angio-edema-urticaria     Asthma      no problems /not used recently   Dysautonomia [facility])     Dysrhythmia      sinus tachycardia   Fibroid      right breast, adenoma   Headache(784.0)     History of COVID-19 11/21/2020   Hives 12/22/2015    Pneumonia      hx  6th grade   Sensation of fullness in both ears 02/02/2023   Snoring 02/11/2022   Urinary frequency 02/12/2019   Vaginal yeast infection 06/02/2022   Vasculitis (HCC)            Has the patient had major surgery during 100 days prior to admission? No   Family History   family history includes Asthma in an other family member; Breast cancer (age of onset: 12) in her maternal grandmother; Cancer in an other family member; Depression in her mother; Epilepsy in an other family member; Hypothyroidism in her mother; Migraines in her mother; Myasthenia gravis in her maternal grandfather; Narcolepsy in her mother.   Current Medications  Current Medications    Current Facility-Administered Medications:    acetaminophen (TYLENOL) tablet 650 mg, 650 mg, Oral, Q6H PRN, Darlin Drop, DO, 650 mg at 01/10/24 0514   atorvastatin (LIPITOR) tablet 80 mg, 80 mg, Oral, Daily, Alanda Slim, Taye T, MD, 80 mg at 01/10/24 1143   clopidogrel (PLAVIX) tablet 75 mg, 75 mg, Oral, Daily, Caryl Pina, MD, 75 mg at 01/10/24 1143   enoxaparin (LOVENOX) injection 40 mg, 40 mg, Subcutaneous, Daily, Hall, Carole N, DO, 40 mg at 01/10/24 1143   FLUoxetine (PROZAC) capsule 40 mg, 40 mg, Oral, Daily, Alanda Slim, Taye T, MD, 40 mg at 01/10/24 1142   labetalol (NORMODYNE) injection 5 mg, 5 mg, Intravenous, Q2H PRN, Hall, Carole N, DO   lidocaine (LIDODERM) 5 % 1 patch, 1 patch, Transdermal, Q24H, Howerter, Justin B, DO, 1 patch at 01/10/24 0518   loperamide (IMODIUM) capsule 2 mg, 2 mg, Oral, PRN, Candelaria Stagers T, MD, 2 mg at 01/08/24 1307   LORazepam (ATIVAN) tablet 1 mg, 1 mg, Oral, Q6H PRN, Alanda Slim, Taye T, MD, 1 mg at 01/08/24 1408   melatonin tablet 5 mg, 5 mg, Oral, QHS, Gonfa, Taye T, MD, 5 mg at 01/08/24 2036   polyethylene glycol (MIRALAX / GLYCOLAX) packet 17 g, 17 g, Oral, Daily PRN, Hall, Carole N, DO   prochlorperazine (COMPAZINE) injection 5 mg, 5 mg, Intravenous, Q6H PRN, Hall, Carole N, DO    sodium bicarbonate tablet 650 mg, 650 mg, Oral, TID, Alanda Slim, Taye T, MD, 650 mg at 01/10/24 1142   topiramate (TOPAMAX) tablet 50 mg, 50 mg, Oral, Daily, 50 mg at 01/10/24 1144 **AND** topiramate (TOPAMAX) tablet 50 mg, 50 mg, Oral, Daily PRN, Alanda Slim, Taye T, MD   traZODone (DESYREL) tablet 100 mg, 100 mg, Oral, QHS PRN, Alanda Slim, Taye T, MD, 100 mg at 01/09/24 2020     Patients Current Diet:  Diet Order                  Diet regular Room service appropriate? Yes; Fluid consistency: Thin  Diet  effective now                         Precautions / Restrictions Precautions Precautions: Fall Precaution/Restrictions Comments: SBP < 180 Restrictions Weight Bearing Restrictions Per Provider Order: No    Has the patient had 2 or more falls or a fall with injury in the past year? No   Prior Activity Level Community (5-7x/wk): Pt active in the community, working   Prior Functional Level Self Care: Did the patient need help bathing, dressing, using the toilet or eating? Independent   Indoor Mobility: Did the patient need assistance with walking from room to room (with or without device)? Independent   Stairs: Did the patient need assistance with internal or external stairs (with or without device)? Independent   Functional Cognition: Did the patient need help planning regular tasks such as shopping or remembering to take medications? Independent   Patient Information Are you of Hispanic, Latino/a,or Spanish origin?: A. No, not of Hispanic, Latino/a, or Spanish origin What is your race?: A. White Do you need or want an interpreter to communicate with a doctor or health care staff?: 0. No   Patient's Response To:  Health Literacy and Transportation Is the patient able to respond to health literacy and transportation needs?: Yes Health Literacy - How often do you need to have someone help you when you read instructions, pamphlets, or other written material from your doctor or pharmacy?:  Never In the past 12 months, has lack of transportation kept you from medical appointments or from getting medications?: No In the past 12 months, has lack of transportation kept you from meetings, work, or from getting things needed for daily living?: No   Journalist, newspaper / Equipment Home Equipment: BSC/3in1, Shower seat   Prior Device Use: Indicate devices/aids used by the patient prior to current illness, exacerbation or injury? None of the above   Current Functional Level Cognition   Arousal/Alertness: Awake/alert Overall Cognitive Status: Impaired/Different from baseline (mild higher level) Orientation Level: Oriented X4 Attention: Sustained Sustained Attention: Appears intact Memory: Appears intact Awareness: Appears intact Problem Solving: Impaired (clock drawing) Problem Solving Impairment: Functional basic Behaviors: Other (comment) (tearful) Safety/Judgment: Appears intact    Extremity Assessment (includes Sensation/Coordination)   Upper Extremity Assessment: Right hand dominant, LUE deficits/detail LUE Deficits / Details: pt has shrug and elbow flexion/ extension ( bicep and tricep)  no activation of wrist or digits at this time. pt provided weight bearing to help with neuro recovery. LUE Sensation: decreased light touch, decreased proprioception LUE Coordination: decreased fine motor, decreased gross motor  Lower Extremity Assessment: Defer to PT evaluation RLE Deficits / Details: WFL RLE Sensation: WNL LLE Deficits / Details: noted to buckle LLE Sensation: WNL LLE Coordination: decreased gross motor     ADLs   Overall ADL's : Needs assistance/impaired Eating/Feeding: Supervision/ safety Eating/Feeding Details (indicate cue type and reason): cues to make sure that she sweeps L side of mouth with tonge. pt taking small appropriate bites and self feeding at end of session. Grooming: Wash/dry hands, Modified independent Toilet Transfer: Minimal  assistance General ADL Comments: pt able to take several steps away from bed with OT blocking LLE     Mobility   Overal bed mobility: Needs Assistance Bed Mobility: Supine to Sit, Sit to Supine Supine to sit: Supervision Sit to supine: Supervision General bed mobility comments: supervision for safety on stretcher     Transfers   Overall transfer level: Needs  assistance Equipment used: Left platform walker Transfers: Sit to/from Stand Sit to Stand: Contact guard assist, Min assist Bed to/from chair/wheelchair/BSC transfer type:: Step pivot Step pivot transfers: Min assist General transfer comment: CGA mostly however MinA to block L LE due to occasional buckling once fatigued.     Ambulation / Gait / Stairs / Wheelchair Mobility   Ambulation/Gait Ambulation/Gait assistance: Min assist, Mod assist, +2 physical assistance, +2 safety/equipment Gait Distance (Feet): 120 Feet [phone]) Assistive device: Left platform walker Gait Pattern/deviations: Ataxic, Narrow base of support, Decreased stride length, Step-through pattern, Knees buckling, Scissoring, Drifts right/left General Gait Details: Pt able to complete multiple trials of ambulation in hallway with Min/Mod A. Mod A once fatigued. Close chair follow provided with patient requiring 4 seated rest breaks. Increased L knee buckling noted with fatigue however no overt LOB. Gait velocity: decreased     Posture / Balance Dynamic Sitting Balance Sitting balance - Comments: pt states its harder to sit up than normal Balance Overall balance assessment: Needs assistance Sitting-balance support: Feet supported, Single extremity supported Sitting balance-Leahy Scale: Fair Sitting balance - Comments: pt states its harder to sit up than normal Standing balance support: Single extremity supported, During functional activity, Reliant on assistive device for balance Standing balance-Leahy Scale: Poor Standing balance comment: reliant on  platform RW     Special needs/care consideration Skin none    Previous Home Environment (from acute therapy documentation) Living Arrangements: Alone  Lives With: Alone Available Help at Discharge: Family, Available 24 hours/day Type of Home: House Home Layout: Multi-level, Able to live on main level with bedroom/bathroom Home Access: Stairs to enter Entrance Stairs-Rails: Right, Left Entrance Stairs-Number of Steps: 3 Bathroom Shower/Tub: Health visitor: Pharmacist, community: Yes Home Care Services: No Additional Comments: History for mother's house where she would discharge from the hospital. pt lives alone with her dog "Letti"   Discharge Living Setting Plans for Discharge Living Setting: House Type of Home at Discharge: House Discharge Home Layout: Multi-level, Able to live on main level with bedroom/bathroom Alternate Level Stairs-Rails: Can reach both Alternate Level Stairs-Number of Steps: 4+2 Discharge Home Access: Stairs to enter Entrance Stairs-Rails: Right Entrance Stairs-Number of Steps: flight Discharge Bathroom Shower/Tub: Walk-in shower Discharge Bathroom Toilet: Standard Discharge Bathroom Accessibility: Yes How Accessible: Accessible via walker, Accessible via wheelchair Does the patient have any problems obtaining your medications?: No   Social/Family/Support Systems Patient Roles: Parent Contact Information: [phone] Anticipated Caregiver: Tammy Maffei Anticipated Caregiver's Contact Information: Pt.'s mother can provide 24/7 Caregiver Availability: 24/7 Discharge Plan Discussed with Primary Caregiver: Yes Is Caregiver In Agreement with Plan?: Yes Does Caregiver/Family have Issues with Lodging/Transportation while Pt is in Rehab?: No   Goals Patient/Family Goal for Rehab: PT/OT/SLP Supervision Expected length of stay: 12-14 days Pt/Family Agrees to Admission and willing to participate: Yes Program Orientation Provided  & Reviewed with Pt/Caregiver Including Roles  & Responsibilities: Yes   Decrease burden of Care through IP rehab admission: not anticipated   Possible need for SNF placement upon discharge: not anticipated    Patient Condition: I have reviewed medical records from [facility], spoken with CM, and patient and family member. I met with patient at the bedside for inpatient rehabilitation assessment.  Patient will benefit from ongoing PT, OT, and SLP, can actively participate in 3 hours of therapy a day 5 days of the week, and can make measurable gains during the admission.  Patient will also benefit from the coordinated team approach during  an Inpatient Acute Rehabilitation admission.  The patient will receive intensive therapy as well as Rehabilitation physician, nursing, social worker, and care management interventions.  Due to safety, skin/wound care, disease management, medication administration, pain management, and patient education the patient requires 24 hour a day rehabilitation nursing.  The patient is currently Min-Max A with mobility and basic ADLs.  Discharge setting and therapy post discharge at home with home health is anticipated.  Patient has agreed to participate in the Acute Inpatient Rehabilitation Program and will admit today.   Preadmission Screen Completed By:  Jeronimo Greaves, 01/10/2024 3:11 PM ______________________________________________________________________   Discussed status with Dr. Berline Chough on 01/12/24 at 10:17 AM and received approval for admission today.   Admission Coordinator:  Jeronimo Greaves, CCC-SLP, time 10:18 AM/Date 01/12/24     Assessment/Plan: Diagnosis: R Basal ganglia infarct Does the need for close, 24 hr/day Medical supervision in concert with the patient's rehab needs make it unreasonable for this patient to be served in a less intensive setting? Yes Co-Morbidities requiring supervision/potential complications: L hemiparesis, L facial  droop- dysphagia; migraines; dysautonomia, Chronic LBP Due to bladder management, bowel management, safety, skin/wound care, disease management, medication administration, pain management, and patient education, does the patient require 24 hr/day rehab nursing? Yes Does the patient require coordinated care of a physician, rehab nurse, PT, OT, and SLP to address physical and functional deficits in the context of the above medical diagnosis(es)? Yes Addressing deficits in the following areas: balance, endurance, locomotion, strength, transferring, bowel/bladder control, bathing, dressing, feeding, grooming, toileting, and speech Can the patient actively participate in an intensive therapy program of at least 3 hrs of therapy 5 days a week? Yes The potential for patient to make measurable gains while on inpatient rehab is good Anticipated functional outcomes upon discharge from inpatient rehab: supervision PT, supervision OT, supervision SLP Estimated rehab length of stay to reach the above functional goals is: 12-14 days Anticipated discharge destination: Home 10. Overall Rehab/Functional Prognosis: good     MD Signature:

## 2024-01-12 NOTE — TOC Transition Note (Signed)
 Transition of Care [facility]) - Discharge Note   Patient Details  Name: Faith Jordan MRN: [id number] Date of Birth: April 01, 1996  [facility] CM/SW Contact:  Kermit Balo, RN Phone Number: 01/12/2024, 11:20 AM   Clinical Narrative:     Pt is discharging to CIR today. CM signing off.  Final next level of care: IP Rehab Facility Barriers to Discharge: No Barriers Identified   Patient Goals and CMS Choice   CMS Medicare.gov Compare Post Acute Care list provided to:: Patient Choice offered to / list presented to : Patient      Discharge Placement                       Discharge Plan and Services Additional resources added to the After Visit Summary for                                       Social Drivers of Health (SDOH) Interventions SDOH Screenings   Food Insecurity: No Food Insecurity (01/09/2024)  Housing: Low Risk  (01/09/2024)  Transportation Needs: No Transportation Needs (01/09/2024)  Utilities: Not At Risk (01/09/2024)  Depression (PHQ2-9): Low Risk  (11/29/2023)  Tobacco Use: Low Risk  (01/07/2024)     Readmission Risk Interventions     No data to display

## 2024-01-12 NOTE — Progress Notes (Signed)
 Physical Therapy Treatment Patient Details Name: Faith Jordan MRN: [id number] DOB: 1996/09/10 Today's Date: 01/12/2024   History of Present Illness 28 y.o. female presents to [facility] 01/07/24 with L sided weakness, Code Stroke called. 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 PMHx: familial hemiplegic migraine, angioedema 2017, dysautonomia    PT Comments  Pt is progressing well towards goals. Currently pt is Mod I for bed mobility,  CGA for sit to stand and CGA to Min A for 200 ft of gait with platform walker. Pt demonstrates hyperextension and buckling at the L knee with gait that improves with moderate tactile dues at hamstring/quad. Practiced control at the L Knee out of hyperextension and preventing buckling with wgt shifting today at EOB. Due to pt current functional status, home set up and available assistance at home recommending skilled physical therapy services > 3 hours/day in order to address strength, balance and functional mobility to decrease risk for falls, injury, immobility, skin break down and re-hospitalization.     If plan is discharge home, recommend the following: A lot of help with walking and/or transfers;Assistance with cooking/housework;Help with stairs or ramp for entrance;Assist for transportation     Equipment Recommendations  Rolling walker (2 wheels);BSC/3in1 (with platform)       Precautions / Restrictions Precautions Precautions: Fall Recall of Precautions/Restrictions: Intact Precaution/Restrictions Comments: SBP < 180 Restrictions Weight Bearing Restrictions Per Provider Order: No     Mobility  Bed Mobility Overal bed mobility: Modified Independent Bed Mobility: Supine to Sit, Sit to Supine     Supine to sit: Modified independent (Device/Increase time) Sit to supine: Modified independent (Device/Increase time)        Transfers Overall transfer level: Needs assistance Equipment used: Left  platform walker Transfers: Sit to/from Stand Sit to Stand: Contact guard assist           General transfer comment: STS from EOB    Ambulation/Gait Ambulation/Gait assistance: Contact guard assist, Min assist Gait Distance (Feet): 200 Feet Assistive device: Left platform walker Gait Pattern/deviations: Decreased stride length, Decreased stance time - left, Knee hyperextension - left, Knees buckling Gait velocity: decreased Gait velocity interpretation: <1.31 ft/sec, indicative of household ambulator   General Gait Details: cues for standing upright, mild circumduction noted on the L, intermittent L knee buckling and hyper extension, Improves with moderate tactile cues at the knee/quad for improved gait pattern.   Modified Rankin (Stroke Patients Only) Modified Rankin (Stroke Patients Only) Pre-Morbid Rankin Score: No symptoms Modified Rankin: Moderately severe disability     Balance Overall balance assessment: Needs assistance Sitting-balance support: No upper extremity supported, Feet supported Sitting balance-Leahy Scale: Good Sitting balance - Comments: on EOB no LOB when getting up to sit despite L UE weakness   Standing balance support: During functional activity, Bilateral upper extremity supported, Single extremity supported, Reliant on assistive device for balance Standing balance-Leahy Scale: Fair Standing balance comment: no overt LOB, requires UE support        Communication Communication Communication: No apparent difficulties  Cognition Arousal: Alert Behavior During Therapy: WFL for tasks assessed/performed   PT - Cognitive impairments: No apparent impairments     PT - Cognition Comments: extremely motivated to participate Following commands: Intact      Cueing Cueing Techniques: Verbal cues     General Comments General comments (skin integrity, edema, etc.): Pt educated on stroke recovery and the importance of rest between therapy in order to  assist with neuroplasticity  and tissue healing within PT scope of practice. Family present during session. No signs/symptoms of cardiac/respiratory distress throughout session.      Pertinent Vitals/Pain Pain Assessment Pain Assessment: No/denies pain     PT Goals (current goals can now be found in the care plan section) Acute Rehab PT Goals Patient Stated Goal: to be able to walk PT Goal Formulation: With patient/family Time For Goal Achievement: 01/22/24 Potential to Achieve Goals: Good Progress towards PT goals: Progressing toward goals    Frequency    Min 3X/week      PT Plan  Continue with current POC        AM-PAC PT "6 Clicks" Mobility   Outcome Measure  Help needed turning from your back to your side while in a flat bed without using bedrails?: None Help needed moving from lying on your back to sitting on the side of a flat bed without using bedrails?: None Help needed moving to and from a bed to a chair (including a wheelchair)?: A Little Help needed standing up from a chair using your arms (e.g., wheelchair or bedside chair)?: A Little Help needed to walk in hospital room?: A Little Help needed climbing 3-5 steps with a railing? : Total 6 Click Score: 18    End of Session Equipment Utilized During Treatment: Gait belt Activity Tolerance: Patient tolerated treatment well Patient left: in bed;with call bell/phone within reach;with bed alarm set;with family/visitor present Nurse Communication: Mobility status PT Visit Diagnosis: Unsteadiness on feet (R26.81);Other abnormalities of gait and mobility (R26.89);Muscle weakness (generalized) (M62.81);Hemiplegia and hemiparesis Hemiplegia - Right/Left: Left Hemiplegia - dominant/non-dominant: Non-dominant Hemiplegia - caused by: Cerebral infarction     Time: 8657-8469 PT Time Calculation (min) (ACUTE ONLY): 19 min  Charges:    $Neuromuscular Re-education: 8-22 mins PT General Charges $$ ACUTE PT VISIT: 1  Visit                    Harrel Carina, DPT, CLT  Acute Rehabilitation Services Office: [phone] (Secure chat preferred)    Claudia Desanctis 01/12/2024, 12:24 PM

## 2024-01-12 NOTE — Discharge Instructions (Addendum)
 Inpatient Rehab Discharge Instructions  Faith Jordan Discharge date and time: No discharge date for patient encounter.   Activities/Precautions/ Functional Status: Activity: activity as tolerated Diet: regular Wound Care: routine skin checks Functional status:  ___ No restrictions     ___ Walk up steps independently ___ 24/7 supervision/assistance   ___ Walk up steps with assistance ___ Intermittent supervision/assistance  ___ Bathe/dress independently ___ Walk with walker     __x_ Bathe/dress with assistance ___ Walk Independently    ___ Shower independently ___ Walk with assistance    ___ Shower with assistance ___ No alcohol     ___ Return to work/school ________  Special Instructions:  No driving smoking or alcohol      COMMUNITY REFERRALS UPON DISCHARGE:     Outpatient: PT    &     OT             Agency:[facility]  [address] [place] [address] Phone:[phone]              Appointment Date/Time:WILL CALL TO SET UP FOLLOW UP APPOINTMENTS  Medical Equipment/Items Ordered:NONE RECOMMENDED                                                 Agency/Supplier:NA  INSURANCE CASE MANAGERAlesia Jordan [phone]       STROKE/TIA DISCHARGE INSTRUCTIONS SMOKING Cigarette smoking nearly doubles your risk of having a stroke & is the single most alterable risk factor  If you smoke or have smoked in the last 12 months, you are advised to quit smoking for your health. Most of the excess cardiovascular risk related to smoking disappears within a year of stopping. Ask you doctor about anti-smoking medications [place] Quit Line: 1-800-QUIT NOW Free Smoking Cessation Classes [phone]  CHOLESTEROL Know your levels; limit fat & cholesterol in your diet  Lipid Panel     Component Value Date/Time   CHOL 151 01/07/2024 2338   TRIG 73 01/07/2024 2338   HDL 30 (L) 01/07/2024 2338   CHOLHDL 5.0 01/07/2024 2338   VLDL 15 01/07/2024 2338   LDLCALC 106 (H)  01/07/2024 2338     Many patients benefit from treatment even if their cholesterol is at goal. Goal: Total Cholesterol (CHOL) less than 160 Goal:  Triglycerides (TRIG) less than 150 Goal:  HDL greater than 40 Goal:  LDL (LDLCALC) less than 100   BLOOD PRESSURE American Stroke Association blood pressure target is less that 120/80 mm/Hg  Your discharge blood pressure is:    Monitor your blood pressure Limit your salt and alcohol intake Many individuals will require more than one medication for high blood pressure  DIABETES (A1c is a blood sugar average for last 3 months) Goal HGBA1c is under 7% (HBGA1c is blood sugar average for last 3 months)  Diabetes: No known diagnosis of diabetes    Lab Results  Component Value Date   HGBA1C 4.9 01/07/2024    Your HGBA1c can be lowered with medications, healthy diet, and exercise. Check your blood sugar as directed by your physician Call your physician if you experience unexplained or low blood sugars.  PHYSICAL ACTIVITY/REHABILITATION Goal is 30 minutes at least 4 days per week  Activity: Increase activity slowly, Therapies: Physical Therapy: Home Health Return to work:  Activity decreases your risk of heart attack and stroke and  makes your heart stronger.  It helps control your weight and blood pressure; helps you relax and can improve your mood. Participate in a regular exercise program. Talk with your doctor about the best form of exercise for you (dancing, walking, swimming, cycling).  DIET/WEIGHT Goal is to maintain a healthy weight  Your discharge diet is:  Diet Order     None       liquids Your height is:    Your current weight is:   Your Body Mass Index (BMI) is:    Following the type of diet specifically designed for you will help prevent another stroke. Your goal weight range is:   Your goal Body Mass Index (BMI) is 19-24. Healthy food habits can help reduce 3 risk factors for stroke:  High cholesterol, hypertension, and  excess weight.  RESOURCES Stroke/Support Group:  Call [phone]   STROKE EDUCATION PROVIDED/REVIEWED AND GIVEN TO PATIENT Stroke warning signs and symptoms How to activate emergency medical system (call 911). Medications prescribed at discharge. Need for follow-up after discharge. Personal risk factors for stroke. Pneumonia vaccine given: No Flu vaccine given: No My questions have been answered, the writing is legible, and I understand these instructions.  I will adhere to these goals & educational materials that have been provided to me after my discharge from the hospital.     My questions have been answered and I understand these instructions. I will adhere to these goals and the provided educational materials after my discharge from the hospital.  Patient/Caregiver Signature _______________________________ Date __________  Clinician Signature _______________________________________ Date __________  Please bring this form and your medication list with you to all your follow-up doctor's appointments.

## 2024-01-12 NOTE — Progress Notes (Signed)
 Patient ID: Faith Jordan, female   DOB: [birth year], 28 y.o.   MRN: [id number] Met with the patient and mother to review current medical situation, rehab process, team conference and plan of care. Reviewed medications for management of secondary risks including HLD (LDL 106), anxiety.  Reviewed management of dysarthria. Mom expressed concern about management of hemiplegic migraines; due for medication 01/16/24; on hold per neurology. MD aware. FMLA paperwork to [facility] [employer] and active My chart account. Continue to follow along to address educational needs to facilitate preparation for discharge. [name]

## 2024-01-12 NOTE — Plan of Care (Signed)
   Problem: Education: Goal: Knowledge of disease or condition will improve Outcome: Progressing   Problem: Ischemic Stroke/TIA Tissue Perfusion: Goal: Complications of ischemic stroke/TIA will be minimized Outcome: Progressing

## 2024-01-12 NOTE — H&P (Signed)
 Physical Medicine and Rehabilitation Admission H&P        Chief Complaint  Patient presents with   Paralysis  : HPI: Faith Jordan. 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 Lucia Gaskins, on birth control, angioedema 2017,dysautonomia.  Per chart review patient lives alone.  Independent prior to admission.  Working as a Systems analyst at [employer].  She plans to stay with her mother on discharge.  Presented 01/07/2024 with acute onset of left-sided weakness and facial droop.  She denied any headache.  Cranial CT scan showed no acute intracranial abnormality.  CTA of head and neck unremarkable.  Patient did not receive tPA.  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  Admission chemistries unremarkable except WBC of 12,700, urine drug screen negative.  Beta-2 glycoprotein and lupus anticoagulant negative.  Protein S and C activity and normal Antithrombin III normal.  Echocardiogram with ejection fraction of 60 to 65% no wall motion abnormalities.  TEE completed showing ejection fraction 65% no atrial appendage or thrombus.  She is currently maintained on Plavix for CVA prophylaxis (allergy to NSAIDs thus was not placed on aspirin..  Lovenox for DVT prophylaxis.  She currently remains on Topamax for headaches.  Therapy evaluations completed due to patient decreased functional mobility left-sided weakness was admitted for a comprehensive rehab program.     Pt reports having "muscle jerking in L side"- it's annoying and can get in way of walking/functioning.    She notes she was told not to take Nurtec and to stop Emgality, however Emgality is due 3/28-3/30- and it's the only CGRP that's helped her Hemiplegic migraines.  HA  3/10- not migraine quite yet.    Has poor appetite since stroke and not getting it back yet.      She also notes gets Botox for TMJ dysfunction.    Had 4 BM's yesterday- were diarrhea per pt- but has improved since got Imodium yesterday.  She reports she started her period, since OCPs were stopped   Said her angioedema was due to Advil, of note.    Review of Systems  Constitutional:  Negative for chills and fever.  HENT:  Negative for hearing loss.   Eyes:  Positive for blurred vision. Negative for double vision.  Respiratory:  Negative for cough, shortness of breath and wheezing.   Cardiovascular:  Negative for chest pain and palpitations.  Gastrointestinal:  Negative for constipation, heartburn, nausea and vomiting.  Genitourinary:  Negative for dysuria, flank pain and hematuria.  Skin:  Negative for rash.  Neurological:  Positive for dizziness, speech change, weakness and headaches.  Psychiatric/Behavioral:  Positive for depression.   All other systems reviewed and are negative.       Past Medical History:  Diagnosis Date   Acne     Acquired breast deformity 12/22/2015   Acute flank pain 06/02/2022   Angio-edema 04/27/2016   Angioedema     Aquagenic angio-edema-urticaria     Asthma      no problems /not used recently   Dysautonomia [facility])     Dysrhythmia      sinus tachycardia   Fibroid      right breast, adenoma   Headache(784.0)     History of COVID-19 11/21/2020   Hives 12/22/2015  Pneumonia      hx  6th grade   Sensation of fullness in both ears 02/02/2023   Snoring 02/11/2022   Urinary frequency 02/12/2019   Vaginal yeast infection 06/02/2022   Vasculitis (HCC)               Past Surgical History:  Procedure Laterality Date   ADENOIDECTOMY       BREAST LUMPECTOMY Right     BREAST LUMPECTOMY Right     MASS EXCISION Right 10/07/2014    Procedure: EXCISION OF RIGHT BREAST MASS;  Surgeon: Avel Peace, MD;  Location: [facility] OR;  Service: General;   Laterality: Right;   TONSILLECTOMY AND ADENOIDECTOMY       TOOTH EXTRACTION       TRANSESOPHAGEAL ECHOCARDIOGRAM (CATH LAB) N/A 01/10/2024    Procedure: TRANSESOPHAGEAL ECHOCARDIOGRAM;  Surgeon: Jake Bathe, MD;  Location: [facility] INVASIVE CV LAB;  Service: Cardiovascular;  Laterality: N/A;             Family History  Problem Relation Age of Onset   Depression Mother     Migraines Mother     Narcolepsy Mother     Hypothyroidism Mother     Myasthenia gravis Maternal Grandfather     Breast cancer Maternal Grandmother 23   Asthma Other     Cancer Other     Epilepsy Other     Allergic rhinitis Neg Hx     Angioedema Neg Hx     Atopy Neg Hx     Eczema Neg Hx     Immunodeficiency Neg Hx     Urticaria Neg Hx          Social History:  reports that she has never smoked. She has never used smokeless tobacco. She reports that she does not drink alcohol and does not use drugs. Allergies:  Allergies       Allergies  Allergen Reactions   Advil [Ibuprofen] Dermatitis      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, blistering peeling skin   Nsaids Dermatitis      Reaction similar to Stephens-Johnson, blistering peeling rash   Sulfa Antibiotics Swelling      Facial swelling            Medications Prior to Admission  Medication Sig Dispense Refill   FLUoxetine (PROZAC) 40 MG capsule Take 1 capsule (40 mg total) by mouth daily. 90 capsule 3   Galcanezumab-gnlm (EMGALITY) 120 MG/ML SOAJ Inject 120 mg into the skin every 30 (thirty) days.       Levonorgestrel-Ethinyl Estradiol (DAYSEE) 0.15-0.03 &0.01 MG tablet Take 1 tablet by mouth daily.       LORazepam (ATIVAN) 0.5 MG tablet Take 1 tablet (0.5 mg total) by mouth 2 (two) times daily as needed for anxiety and panic attacks. 60 tablet 2   Rimegepant Sulfate (NURTEC) 75 MG TBDP Take 1 tablet by mouth daily as needed. Take for abortive therapy of migraine headaches. No more than 1 every 24 hours and no more than 10 per month.       topiramate  (TOPAMAX) 50 MG tablet Take 1 tablet (50 mg total) by mouth daily. May also take 1 tablet (50 mg total) daily as needed (daily during menstrual cycle). 180 tablet 3   traZODone (DESYREL) 50 MG tablet Take 1-3 tablets (50-150 mg total) by mouth at bedtime as needed for sleep. 270 tablet 3              Home: Home Living Family/patient expects to  be discharged to:: Inpatient rehab Living Arrangements: Alone Available Help at Discharge: Family, Available 24 hours/day Type of Home: House Home Access: Stairs to enter [employer] of Steps: 3 Entrance Stairs-Rails: Right, Left Home Layout: Multi-level, Able to live on main level with bedroom/bathroom Bathroom Shower/Tub: Health visitor: Standard Bathroom Accessibility: Yes Home Equipment: BSC/3in1, Shower seat Additional Comments: History for mother's house where she would discharge from the hospital. 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 Had multiple falls leading up to admission ADLs Comments: ind   Functional Status:  Mobility: Bed Mobility Overal bed mobility: Modified Independent Bed Mobility: Supine to Sit Supine to sit: HOB elevated, Used rails, Supervision Sit to supine: Supervision, HOB elevated, Used rails (extra time to elevate LLE onto bed) General bed mobility comments: comes up into long sitting then swings around Transfers Overall transfer level: Needs assistance Equipment used: None Transfers: Sit to/from Stand Sit to Stand: Min assist Bed to/from chair/wheelchair/BSC transfer type:: Step pivot Step pivot transfers: Min assist General transfer comment: STS from EOB Ambulation/Gait Ambulation/Gait assistance: Mod assist, Max assist Gait Distance (Feet): 140 Feet Assistive device: 1  person hand held assist Gait Pattern/deviations: Decreased stride length, Decreased stance time - left, Trendelenburg General Gait Details: Pt ambulated to nurses station & back without AD with PT supporting LUE throughout gait. Pt presents with LLE genu recurvatum in stance phase. Pt is able to advance LLE with cuing for increased LLE quad & glute activation in stance phase to assist with balance with good return demo from pt. Gait velocity: decreased Pre-gait activities: at rail in hallway working on L hip flexion to initiate swing and through initial contact and bringing back in reverse for strengthening and sequencing/timing   ADL: ADL Overall ADL's : Needs assistance/impaired Eating/Feeding: Supervision/ safety Eating/Feeding Details (indicate cue type and reason): cues to make sure that she sweeps L side of mouth with tonge. pt taking small appropriate bites and self feeding at end of session. Grooming: Wash/dry hands, Modified independent Lower Body Dressing Details (indicate cue type and reason): Worked on crossing one leg over the other and taking off/putting on socks. Able to maintain balance at EOB while doing this, needed A of RUE to cross LLE in "figure 4" fashion and this is currently not able to maintain "figure 4" position on her own with LLE. Held her LLE for her in this position and A'd her with taking sock off and putting back on with hand over hand A for LUE (she can close hand--composite flexion; but cannot maintain grip) as she used both hands to put sock back on--did this also with RLE sock. Toilet Transfer: Minimal assistance General ADL Comments: pt able to take several steps away from bed with OT blocking LLE   Cognition: Cognition Overall Cognitive Status: Impaired/Different from baseline (mild higher level) Arousal/Alertness: Awake/alert Orientation Level: Oriented X4 Year: 2025 Day of Week: Correct Attention: Sustained Sustained Attention: Appears intact Memory:  Appears intact Awareness: Appears intact Problem Solving: Impaired (clock drawing) Problem Solving Impairment: Functional basic Behaviors: Other (comment) (tearful) Safety/Judgment: Appears intact Cognition Arousal: Alert Behavior During Therapy: WFL for tasks assessed/performed Overall Cognitive Status: Impaired/Different from baseline (mild higher level)   Physical Exam: Blood pressure 105/76, pulse 85, temperature 98.3 F (36.8 C), temperature source Oral, resp. rate 18, height  5\' 7"  (1.702 m), weight 77.1 kg, SpO2 96%. Physical Exam Vitals and nursing note reviewed.  Constitutional:      Appearance: Normal appearance. She is normal weight.     Comments: Sitting up in bed with mother and multiple friends at bedside; awake, alert, appropriate, NAD  HENT:     Head: Normocephalic and atraumatic.     Comments: L facial droop- noted at rest and with smile Tongue midline    Right Ear: External ear normal.     Left Ear: External ear normal.     Nose: Nose normal. No congestion.     Mouth/Throat:     Mouth: Mucous membranes are dry.     Pharynx: Oropharynx is clear. No oropharyngeal exudate.  Eyes:     General:        Right eye: No discharge.        Left eye: No discharge.     Extraocular Movements: Extraocular movements intact.     Comments: No nystagmus seen  Cardiovascular:     Rate and Rhythm: Normal rate and regular rhythm.     Heart sounds: Normal heart sounds. No murmur heard.    No gallop.  Pulmonary:     Effort: Pulmonary effort is normal. No respiratory distress.     Breath sounds: Normal breath sounds. No wheezing, rhonchi or rales.  Abdominal:     General: Bowel sounds are normal. There is no distension.     Palpations: Abdomen is soft.     Tenderness: There is no abdominal tenderness.  Musculoskeletal:     Cervical back: Neck supple. No tenderness.     Comments: RUE 5/5 LUE- biceps 3+/5; Triceps 3+/5; WE 3/5; Grip 4-/5; FA 0/5 RLE- 5/5 LLE- HF 4/5; KE- 4+/5;  KF 4/5; DF 4 to 4+/5; PF 4+/5   Skin:    General: Skin is warm and dry.     Comments: IV R forearm- looks OK  Neurological:     Mental Status: She is alert.     Comments: Patient is alert and makes eye contact with examiner.  Follows simple commands.  Oriented x 3 with good insight and awareness.  She is mildly dysarthric. Is working hard on enunciation- when speaking to me, didn't hear dysarthria, heard slowed speech due to working harder to speak clearly, but near end of interview, very mild dysarthria noted as got tired 4-5 beats clonus LLE No hoffman's LUE No increased tone in LUE, but MAS of 1 in Hip, and knee and ankle on LLE Intact to light touch in all 4 extremities and face  Psychiatric:        Mood and Affect: Mood normal.        Behavior: Behavior normal.     Comments: Slightly anxious- about recovery        Lab Results Last 48 Hours  No results found for this or any previous visit (from the past 48 hours).    Imaging Results (Last 48 hours)  ECHO TEE Result Date: 01/10/2024    TRANSESOPHOGEAL ECHO REPORT   Patient Name:   TIMMIE CALIX Date of Exam: 01/10/2024 Medical Rec #:  [id number]      Height:       67.0 in Accession #:    [id number]     Weight:       170.0 lb Date of Birth:  Dec 14, 1995      BSA:          1.887 m Patient Age:  28 years       BP:           139/85 mmHg Patient Gender: F              HR:           96 bpm. Exam Location:  Inpatient Procedure: Transesophageal Echo, Color Doppler and Cardiac Doppler (Both            Spectral and Color Flow Doppler were utilized during procedure). Indications:     Stroke  History:         Patient has prior history of Echocardiogram examinations, most                  recent 01/08/2024.  Sonographer:     Harriette Bouillon RDCS Referring Phys:  Almon Hercules Diagnosing Phys: Donato Schultz MD PROCEDURE: The transesophogeal probe was passed without difficulty through the esophogus of the patient. Sedation performed by different physician.  The patient's vital signs; including heart rate, blood pressure, and oxygen saturation; remained stable throughout the procedure. The patient developed no complications during the procedure.  IMPRESSIONS  1. Left ventricular ejection fraction, by estimation, is 60 to 65%. The left ventricle has normal function. The left ventricle has no regional wall motion abnormalities.  2. Right ventricular systolic function is normal. The right ventricular size is normal.  3. No left atrial/left atrial appendage thrombus was detected.  4. The mitral valve is normal in structure. No evidence of mitral valve regurgitation. No evidence of mitral stenosis.  5. The aortic valve is normal in structure. Aortic valve regurgitation is not visualized. No aortic stenosis is present.  6. The inferior vena cava is normal in size with greater than 50% respiratory variability, suggesting right atrial pressure of 3 mmHg.  7. Agitated saline contrast bubble study was negative, with no evidence of any interatrial shunt. Conclusion(s)/Recommendation(s): Normal biventricular function without evidence of hemodynamically significant valvular heart disease. FINDINGS  Left Ventricle: Left ventricular ejection fraction, by estimation, is 60 to 65%. The left ventricle has normal function. The left ventricle has no regional wall motion abnormalities. The left ventricular internal cavity size was normal in size. There is  no left ventricular hypertrophy. Right Ventricle: The right ventricular size is normal. No increase in right ventricular wall thickness. Right ventricular systolic function is normal. Left Atrium: Left atrial size was normal in size. No left atrial/left atrial appendage thrombus was detected. Right Atrium: Right atrial size was normal in size. Pericardium: There is no evidence of pericardial effusion. Mitral Valve: The mitral valve is normal in structure. No evidence of mitral valve regurgitation. No evidence of mitral valve stenosis.  Tricuspid Valve: The tricuspid valve is normal in structure. Tricuspid valve regurgitation is not demonstrated. No evidence of tricuspid stenosis. Aortic Valve: The aortic valve is normal in structure. Aortic valve regurgitation is not visualized. No aortic stenosis is present. Pulmonic Valve: The pulmonic valve was normal in structure. Pulmonic valve regurgitation is not visualized. No evidence of pulmonic stenosis. Aorta: The aortic root is normal in size and structure. Venous: The inferior vena cava is normal in size with greater than 50% respiratory variability, suggesting right atrial pressure of 3 mmHg. IAS/Shunts: There is redundancy of the interatrial septum. No atrial level shunt detected by color flow Doppler. Agitated saline contrast bubble study was negative, with no evidence of any interatrial shunt. There is no evidence of a patent foramen ovale. There is no evidence of an atrial septal defect. Loraine Leriche  Skains MD Electronically signed by Donato Schultz MD Signature Date/Time: 01/10/2024/12:30:28 PM    Final     EP STUDY Result Date: 01/10/2024 See surgical note for result.          Blood pressure 105/76, pulse 85, temperature 98.3 F (36.8 C), temperature source Oral, resp. rate 18, height 5\' 7"  (1.702 m), weight 77.1 kg, SpO2 96%.   Medical Problem List and Plan: 1. 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  Antithrombotics: -DVT/anticoagulation:  Pharmaceutical: Lovenox             -antiplatelet therapy: Plavix 75 mg daily.  Patient with allergy to NSAIDs thus was not placed on aspirin 3. Pain Management: Lido Derm patch 4. Mood/Behavior/Sleep: Prozac 40 mg daily, melatonin 5 mg nightly, Ativan 1 mg every 6 hours as needed anxiety             -antipsychotic agents: N/A 5.  Neuropsych/cognition: This patient is capable of making decisions on her own behalf. 6. Skin/Wound Care: Routine skin check 7. Fluids/Electrolytes/Nutrition: Routine in and outs with follow-up chemistries 8.  Current History of hemiplegic migraines.  Followed by neurology service Dr. Lucia Gaskins.  Continue Topamax- per pt, took off Emgality (which is due ~3/28) as well as Nurtec- she's wondering what can take when has migraine 9.  Hyperlipidemia.  Lipitor 10. Spasticity             3/27- developing in LLE already- educated on spasticity - that can progress for up to 1 year- and that can impair function- suggest starting baclofen if it's impairing function or becomes painful.  11. TMJ dysfunction- gets Botox for this         Charlton Amor, PA-C 01/12/2024     I have personally performed a face to face diagnostic evaluation of this patient and formulated the key components of the plan.  Additionally, I have personally reviewed laboratory data, imaging studies, as well as relevant notes and concur with the physician assistant's documentation above.   The patient's status has not changed from the original H&P.  Any changes in documentation from the acute care chart have been noted above.

## 2024-01-12 NOTE — Progress Notes (Signed)
 Inpatient Rehabilitation Admission Medication Review by a Pharmacist  A complete drug regimen review was completed for this patient to identify any potential clinically significant medication issues.  High Risk Drug Classes Is patient taking? Indication by Medication  Antipsychotic No   Anticoagulant Yes Lovenox - VTE prophylaxis  Antibiotic No   Opioid No   Antiplatelet Yes Clopidogrel- CVA  Hypoglycemics/insulin No   Vasoactive Medication No   Chemotherapy No   Other Yes Acetaminophen, lidocaine patch- pain management Acidophilus (Risaquad)- probiotic Atorvastatin - HLD Fibercon, miralax -bowel regimen/constipation Lorazepam- ,h/o anxiety/panic attack Prozac- mood/behavior, h/o anxiety, panic attack Sodium bicarbonate-supplement Topiramate- hx/o hemiplegic migraines Trazodone, melatonin- sleep/insomnia     Type of Medication Issue Identified Description of Issue Recommendation(s)  Drug Interaction(s) (clinically significant)     Duplicate Therapy     Allergy     No Medication Administration End Date     Incorrect Dose     Additional Drug Therapy Needed     Significant med changes from prior encounter (inform family/care partners about these prior to discharge). PTA medication: Daysee (Levonorgestrel- Ethinyl Estradiol) , Emgality and Nurtec were discontinued by neurologist on [facility] acute admission/discharge.  Communicate to patient /family/ caregiver prior to discharge.   Other       Clinically significant medication issues were identified that warrant physician communication and completion of prescribed/recommended actions by midnight of the next day:  No  Name of provider notified for urgent issues identified:   Provider Method of Notification:     Pharmacist comments:   Time spent performing this drug regimen review (minutes): 30   Noah Delaine, [place] Clinical Pharmacist 01/12/2024 8:26 PM

## 2024-01-13 DIAGNOSIS — I6381 Other cerebral infarction due to occlusion or stenosis of small artery: Secondary | ICD-10-CM | POA: Diagnosis not present

## 2024-01-13 DIAGNOSIS — G43709 Chronic migraine without aura, not intractable, without status migrainosus: Secondary | ICD-10-CM

## 2024-01-13 DIAGNOSIS — T384X5S Adverse effect of oral contraceptives, sequela: Secondary | ICD-10-CM | POA: Diagnosis not present

## 2024-01-13 LAB — CBC WITH DIFFERENTIAL/PLATELET
Abs Immature Granulocytes: 0.04 10*3/uL (ref 0.00–0.07)
Basophils Absolute: 0 10*3/uL (ref 0.0–0.1)
Basophils Relative: 0 %
Eosinophils Absolute: 0.1 10*3/uL (ref 0.0–0.5)
Eosinophils Relative: 1 %
HCT: 45.7 % (ref 36.0–46.0)
Hemoglobin: 15.5 g/dL — ABNORMAL HIGH (ref 12.0–15.0)
Immature Granulocytes: 0 %
Lymphocytes Relative: 22 %
Lymphs Abs: 2.2 10*3/uL (ref 0.7–4.0)
MCH: 28.7 pg (ref 26.0–34.0)
MCHC: 33.9 g/dL (ref 30.0–36.0)
MCV: 84.6 fL (ref 80.0–100.0)
Monocytes Absolute: 0.9 10*3/uL (ref 0.1–1.0)
Monocytes Relative: 9 %
Neutro Abs: 7 10*3/uL (ref 1.7–7.7)
Neutrophils Relative %: 68 %
Platelets: 343 10*3/uL (ref 150–400)
RBC: 5.4 MIL/uL — ABNORMAL HIGH (ref 3.87–5.11)
RDW: 13.3 % (ref 11.5–15.5)
WBC: 10.1 10*3/uL (ref 4.0–10.5)
nRBC: 0 % (ref 0.0–0.2)

## 2024-01-13 LAB — COMPREHENSIVE METABOLIC PANEL WITH GFR
ALT: 38 U/L (ref 0–44)
AST: 30 U/L (ref 15–41)
Albumin: 3.8 g/dL (ref 3.5–5.0)
Alkaline Phosphatase: 68 U/L (ref 38–126)
Anion gap: 10 (ref 5–15)
BUN: 10 mg/dL (ref 6–20)
CO2: 24 mmol/L (ref 22–32)
Calcium: 9.2 mg/dL (ref 8.9–10.3)
Chloride: 106 mmol/L (ref 98–111)
Creatinine, Ser: 0.85 mg/dL (ref 0.44–1.00)
GFR, Estimated: >60 mL/min (ref >60)
Glucose, Bld: 100 mg/dL — ABNORMAL HIGH (ref 70–99)
Potassium: 3.2 mmol/L — ABNORMAL LOW (ref 3.5–5.1)
Sodium: 140 mmol/L (ref 135–145)
Total Bilirubin: 0.6 mg/dL (ref 0.0–1.2)
Total Protein: 7.2 g/dL (ref 6.5–8.1)

## 2024-01-13 MED ORDER — POTASSIUM CHLORIDE CRYS ER 20 MEQ PO TBCR
40.0000 meq | EXTENDED_RELEASE_TABLET | Freq: Two times a day (BID) | ORAL | Status: AC
  Administered 2024-01-13 (×2): 40 meq via ORAL
  Filled 2024-01-13 (×2): qty 2

## 2024-01-13 NOTE — Plan of Care (Signed)
 Problem: RH Balance Goal: LTG Patient will maintain dynamic standing with ADLs (OT) Description: LTG:  Patient will maintain dynamic standing balance with assist during activities of daily living (OT)  Flowsheets (Taken 01/13/2024 1302) LTG: Pt will maintain dynamic standing balance during ADLs with: Independent with assistive device   Problem: Sit to Stand Goal: LTG:  Patient will perform sit to stand in prep for activites of daily living with assistance level (OT) Description: LTG:  Patient will perform sit to stand in prep for activites of daily living with assistance level (OT) Flowsheets (Taken 01/13/2024 1302) LTG: PT will perform sit to stand in prep for activites of daily living with assistance level: Independent with assistive device   Problem: RH Grooming Goal: LTG Patient will perform grooming w/assist,cues/equip (OT) Description: LTG: Patient will perform grooming with assist, with/without cues using equipment (OT) Flowsheets (Taken 01/13/2024 1302) LTG: Pt will perform grooming with assistance level of: Supervision/Verbal cueing   Problem: RH Bathing Goal: LTG Patient will bathe all body parts with assist levels (OT) Description: LTG: Patient will bathe all body parts with assist levels (OT) Flowsheets (Taken 01/13/2024 1302) LTG: Pt will perform bathing with assistance level/cueing: Supervision/Verbal cueing LTG: Position pt will perform bathing: Shower   Problem: RH Dressing Goal: LTG Patient will perform upper body dressing (OT) Description: LTG Patient will perform upper body dressing with assist, with/without cues (OT). Flowsheets (Taken 01/13/2024 1302) LTG: Pt will perform upper body dressing with assistance level of: Independent with assistive device Goal: LTG Patient will perform lower body dressing w/assist (OT) Description: LTG: Patient will perform lower body dressing with assist, with/without cues in positioning using equipment (OT) Flowsheets (Taken 01/13/2024  1302) LTG: Pt will perform lower body dressing with assistance level of: Independent with assistive device   Problem: RH Toileting Goal: LTG Patient will perform toileting task (3/3 steps) with assistance level (OT) Description: LTG: Patient will perform toileting task (3/3 steps) with assistance level (OT)  Flowsheets (Taken 01/13/2024 1302) LTG: Pt will perform toileting task (3/3 steps) with assistance level: Independent with assistive device   Problem: RH Functional Use of Upper Extremity Goal: LTG Patient will use RT/LT upper extremity as a (OT) Description: LTG: Patient will use right/left upper extremity as a stabilizer/gross assist/diminished/nondominant/dominant level with assist, with/without cues during functional activity (OT) Flowsheets (Taken 01/13/2024 1302) LTG: Use of upper extremity in functional activities: LUE as diminished level LTG: Pt will use upper extremity in functional activity with assistance level of: Independent with assistive device   Problem: RH Simple Meal Prep Goal: LTG Patient will perform simple meal prep w/assist (OT) Description: LTG: Patient will perform simple meal prep with assistance, with/without cues (OT). Flowsheets (Taken 01/13/2024 1302) LTG: Pt will perform simple meal prep with assistance level of: Supervision/Verbal cueing   Problem: RH Laundry Goal: LTG Patient will perform laundry w/assist, cues (OT) Description: LTG: Patient will perform laundry with assistance, with/without cues (OT). Flowsheets (Taken 01/13/2024 1302) LTG: Pt will perform laundry with assistance level of: Supervision/Verbal cueing   Problem: RH Toilet Transfers Goal: LTG Patient will perform toilet transfers w/assist (OT) Description: LTG: Patient will perform toilet transfers with assist, with/without cues using equipment (OT) Flowsheets (Taken 01/13/2024 1302) LTG: Pt will perform toilet transfers with assistance level of: Independent with assistive device    Problem: RH Tub/Shower Transfers Goal: LTG Patient will perform tub/shower transfers w/assist (OT) Description: LTG: Patient will perform tub/shower transfers with assist, with/without cues using equipment (OT) Flowsheets (Taken 01/13/2024 1302) LTG: Pt will  perform tub/shower stall transfers with assistance level of: Supervision/Verbal cueing   Problem: RH Attention Goal: LTG Patient will demonstrate this level of attention during functional activites (OT) Description: LTG:  Patient will demonstrate this level of attention during functional activites  (OT) Flowsheets (Taken 01/13/2024 1302) Patient will demonstrate this level of attention during functional activites: Alternating LTG: Patient will demonstrate this level of attention during functional activites (OT): Modified Independent

## 2024-01-13 NOTE — Plan of Care (Signed)
 Pt is progressing

## 2024-01-13 NOTE — Progress Notes (Signed)
 PROGRESS NOTE   Subjective/Complaints:  Spoke to pt and Dad about rehab process and prognosis  ROS- neg CP, SOB, N/V/D  Objective:   No results found. Recent Labs    01/12/24 1743 01/13/24 0655  WBC 11.4* 10.1  HGB 16.1* 15.5*  HCT 46.3* 45.7  PLT 385 343   Recent Labs    01/12/24 1743 01/13/24 0655  NA  --  140  K  --  3.2*  CL  --  106  CO2  --  24  GLUCOSE  --  100*  BUN  --  10  CREATININE 0.87 0.85  CALCIUM  --  9.2    Intake/Output Summary (Last 24 hours) at 01/13/2024 0901 Last data filed at 01/13/2024 0755 Gross per 24 hour  Intake 120 ml  Output --  Net 120 ml        Physical Exam: Vital Signs Blood pressure 105/72, pulse 83, temperature 97.9 F (36.6 C), temperature source Oral, resp. rate 18, height 5\' 7"  (1.702 m), weight 75 kg, SpO2 100%.   General: No acute distress Mood and affect are appropriate Heart: Regular rate and rhythm no rubs murmurs or extra sounds Lungs: Clear to auscultation, breathing unlabored, no rales or wheezes Abdomen: Positive bowel sounds, soft nontender to palpation, nondistended Extremities: No clubbing, cyanosis, or edema Skin: No evidence of breakdown, no evidence of rash Neurologic: Cranial nerves II through XII intact, motor strength is 5/5 in right and 3-/5 left deltoid, bicep, tricep, grip, 5/5 RIght and 4- left hip flexor, knee extensors, 5/5 righ tand 3- left ankle dorsiflexor and plantar flexor Tone 3+ biceps reflex Sustained clonus Left ankle PF Sensory exam normal sensation to light touch  in bilateral upper and lower extremities Cerebellar exam limited by weakness LUE  Musculoskeletal: Full range of motion in all 4 extremities. No joint swelling   Assessment/Plan: 1. Functional deficits which require 3+ hours per day of interdisciplinary therapy in a comprehensive inpatient rehab setting. Physiatrist is providing close team supervision and 24 hour  management of active medical problems listed below. Physiatrist and rehab team continue to assess barriers to discharge/monitor patient progress toward functional and medical goals  Care Tool:  Bathing              Bathing assist       Upper Body Dressing/Undressing Upper body dressing        Upper body assist      Lower Body Dressing/Undressing Lower body dressing            Lower body assist       Toileting Toileting    Toileting assist       Transfers Chair/bed transfer  Transfers assist           Locomotion Ambulation   Ambulation assist              Walk 10 feet activity   Assist           Walk 50 feet activity   Assist           Walk 150 feet activity   Assist           Walk  10 feet on uneven surface  activity   Assist           Wheelchair     Assist               Wheelchair 50 feet with 2 turns activity    Assist            Wheelchair 150 feet activity     Assist          Blood pressure 105/72, pulse 83, temperature 97.9 F (36.6 C), temperature source Oral, resp. rate 18, height 5\' 7"  (1.702 m), weight 75 kg, SpO2 100%.  Medical Problem List and Plan: 1. 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  Antithrombotics: -DVT/anticoagulation:  Pharmaceutical: Lovenox             -antiplatelet therapy: Plavix 75 mg daily.  Patient with allergy to NSAIDs thus was not placed on aspirin 3. Pain Management: Lido Derm patch 4. Mood/Behavior/Sleep: Prozac 40 mg daily, melatonin 5 mg nightly, Ativan 1 mg every 6 hours as needed anxiety             -antipsychotic agents: N/A 5. Neuropsych/cognition: This patient is capable of making decisions on her own behalf. 6.  Skin/Wound Care: Routine skin check 7. Fluids/Electrolytes/Nutrition: Routine in and outs with follow-up chemistries 8.  Current History of hemiplegic migraines.  Followed by neurology service Dr. Lucia Gaskins.  Continue Topamax- per pt, took off Emgality (which is due ~3/28) as well as Nurtec- she's wondering what can take when has migraine 9.  Hyperlipidemia.  Lipitor 10. 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

## 2024-01-13 NOTE — Evaluation (Signed)
 Occupational Therapy Assessment and Plan  Patient Details  Name: Faith Jordan MRN: [id number] Date of Birth: 08/10/1996  OT Diagnosis: cognitive deficits, hemiplegia affecting non-dominant side, and muscle weakness (generalized) Rehab Potential: Rehab Potential (ACUTE ONLY): Good ELOS: 14-16 days   Today's Date: 01/13/2024 OT Individual Time: 7829-5621 OT Individual Time Calculation (min): 70 min     Hospital Problem: Principal Problem:   Stroke of right basal ganglia (HCC) Active Problems:   Cerebrovascular accident (CVA) of right basal ganglia (HCC)   Past Medical History:  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   Acute flank pain 06/02/2022   Angio-edema 04/27/2016   Angioedema    Aquagenic angio-edema-urticaria    Asthma    no problems /not used recently   Dysautonomia (HCC)    Dysrhythmia    sinus tachycardia   Fibroid    right breast, adenoma   Headache(784.0)    History of COVID-19 11/21/2020   Hives 12/22/2015   Pneumonia    hx  6th grade   Sensation of fullness in both ears 02/02/2023   Snoring 02/11/2022   Urinary frequency 02/12/2019   Vaginal yeast infection 06/02/2022   Vasculitis (HCC)    Past Surgical History:  Past Surgical History:  Procedure Laterality Date   ADENOIDECTOMY     BREAST LUMPECTOMY Right    BREAST LUMPECTOMY Right    MASS EXCISION Right 10/07/2014   Procedure: EXCISION OF RIGHT BREAST MASS;  Surgeon: Avel Peace, MD;  Location: [facility] OR;  Service: General;  Laterality: Right;   TONSILLECTOMY AND ADENOIDECTOMY     TOOTH EXTRACTION     TRANSESOPHAGEAL ECHOCARDIOGRAM (CATH LAB) N/A 01/10/2024   Procedure: TRANSESOPHAGEAL ECHOCARDIOGRAM;  Surgeon: Jake Bathe, MD;  Location: [facility] INVASIVE CV LAB;  Service: Cardiovascular;  Laterality: N/A;    Assessment & Plan Clinical Impression: Faith Jordan is a 28 year old right-handed female with history significant for 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 Lucia Gaskins, on birth control, angioedema 2017,dysautonomia. Per chart review patient lives alone. Independent prior to admission. Working as a Systems analyst at [employer]. She plans to stay with her mother on discharge. Presented 01/07/2024 with acute onset of left-sided weakness and facial droop. She denied any headache. Cranial CT scan showed no acute intracranial abnormality. CTA of head and neck unremarkable. Patient did not receive tPA. 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 Admission chemistries unremarkable except WBC of 12,700, urine drug screen negative. Beta-2 glycoprotein and lupus anticoagulant negative. Protein S and C activity and normal Antithrombin III normal. Echocardiogram with ejection fraction of 60 to 65% no wall motion abnormalities. TEE completed showing ejection fraction 65% no atrial appendage or thrombus. She is currently maintained on Plavix for CVA prophylaxis (allergy to NSAIDs thus was not placed on aspirin.. Lovenox for DVT prophylaxis. She currently remains on Topamax for headaches.  Patient transferred to CIR on 01/12/2024 .    Patient currently requires min with basic self-care skills secondary to muscle weakness, decreased cardiorespiratoy endurance, abnormal tone, unbalanced muscle activation, decreased coordination, and decreased motor planning, decreased visual acuity, decreased motor planning, decreased attention and decreased problem solving, central origin, and decreased sitting balance, decreased standing balance, decreased postural control, hemiplegia, and decreased balance strategies.  Prior to hospitalization, patient could complete BADLs,  IADLs, work, and drive with independent .  Patient will benefit from skilled intervention to  decrease level of assist with basic self-care skills, increase independence with basic self-care skills, and increase level of independence with iADL prior to discharge home with care partner.  Anticipate patient will require intermittent supervision and follow up outpatient.  OT - End of Session Activity Tolerance: Decreased this session Endurance Deficit: Yes Endurance Deficit Description: mild endruance deficit OT Assessment Rehab Potential (ACUTE ONLY): Good OT Patient demonstrates impairments in the following area(s): Balance;Perception;Motor;Endurance;Vision;Edema;Cognition;Sensory;Safety OT Basic ADL's Functional Problem(s): Grooming;Bathing;Dressing;Toileting OT Advanced ADL's Functional Problem(s): Light Housekeeping;Simple Meal Preparation;Laundry OT Transfers Functional Problem(s): Tub/Shower;Toilet OT Additional Impairment(s): Fuctional Use of Upper Extremity OT Plan OT Intensity: Minimum of 1-2 x/day, 45 to 90 minutes OT Frequency: 5 out of 7 days OT Duration/Estimated Length of Stay: 14-16 days OT Treatment/Interventions: Balance/vestibular training;Discharge planning;Functional electrical stimulation;Pain management;Self Care/advanced ADL retraining;Therapeutic Activities;UE/LE Coordination activities;Cognitive remediation/compensation;Disease mangement/prevention;Functional mobility training;Patient/family education;Skin care/wound managment;Therapeutic Exercise;Visual/perceptual remediation/compensation;Community reintegration;DME/adaptive equipment instruction;Neuromuscular re-education;Psychosocial support;Splinting/orthotics;UE/LE Strength taining/ROM;Wheelchair propulsion/positioning OT Self Feeding Anticipated Outcome(s): Mod I OT Basic Self-Care Anticipated Outcome(s): Mod I-superivsion OT Toileting Anticipated Outcome(s): Mod I OT Bathroom Transfers Anticipated Outcome(s): Mod I OT Recommendation Recommendations for Other Services: Therapeutic Recreation  consult Therapeutic Recreation Interventions: Outing/community reintergration;Stress management;Pet therapy;Kitchen group Patient destination: Home Follow Up Recommendations: Outpatient OT Equipment Recommended: To be determined   OT Evaluation Precautions/Restrictions  Precautions Precautions: Fall Restrictions Weight Bearing Restrictions Per Provider Order: No General Chart Reviewed: Yes Additional Pertinent History: Migraines Family/Caregiver Present: Yes (Father- Todd) Pain Pain Assessment Pain Scale: 0-10 Pain Score: 0-No pain Home Living/Prior Functioning Home Living Living Arrangements: Alone Available Help at Discharge: Family, Available 24 hours/day Type of Home: House Home Access: Stairs to enter [employer] of Steps: 3 Entrance Stairs-Rails: Right, Left Home Layout: Multi-level, Able to live on main level with bedroom/bathroom Bathroom Shower/Tub: Health visitor: Standard Bathroom Accessibility: Yes  Lives With: Alone (Pt lives alone, however she is plannign to d/c to her parents home until she is able to live independently so the following info reflect's her parent's home set-up) IADL History Homemaking Responsibilities: Yes Meal Prep Responsibility: Primary Laundry Responsibility: Primary Cleaning Responsibility: Primary Bill Paying/Finance Responsibility: Primary Shopping Responsibility: Primary Homemaking Comments: She has a Development worker, international aid names "Letti" that she cares for Current License: Yes Mode of Transportation: Car Education: Pharmacist, hospital at [employer] Occupation: Full time employment Type of Occupation: Charity fundraiser Leisure and Hobbies: Reading Prior Function Level of Independence: Independent with gait, Independent with basic ADLs, Independent with homemaking with ambulation  Able to Take Stairs?: Yes Driving: Yes Vocation: Full time employment Vision Baseline Vision/History: 0 No visual deficits Ability to See in Adequate  Light: 1 Impaired Patient Visual Report: Blurring of vision Vision Assessment?: Yes Eye Alignment: Within Functional Limits Ocular Range of Motion: Within Functional Limits Alignment/Gaze Preference: Within Defined Limits Tracking/Visual Pursuits: Able to track stimulus in all quads without difficulty Saccades: Within functional limits Convergence: Within functional limits Visual Fields: No apparent deficits Additional Comments: Reports blrring of vision, however not as bad as it was- able to read signs and clock in room; able to read phone with font enlarged Perception  Perception: Within Functional Limits (will continue to assess) Praxis Praxis: Impaired Praxis Impairment Details: Motor planning Cognition Cognition Overall Cognitive Status: Impaired/Different from baseline (Simultaneous filing. User may not have seen previous data.) Arousal/Alertness: Awake/alert (Simultaneous filing. User may not have seen previous data.) Orientation Level: Person;Place;Situation Person: Oriented Place: Oriented Situation: Oriented  Memory: Appears intact (Simultaneous filing. User may not have seen previous data.) Attention: Selective;Alternating (mild attendtion deficit) Sustained Attention: Impaired Sustained Attention Impairment: Verbal complex;Functional complex Selective Attention: Appears intact Awareness: Appears intact Problem Solving: Impaired (impaired for higher level IADLs or work related tasks) Problem Solving Impairment: Verbal complex;Functional complex Behaviors: Other (comment) (tearful) Safety/Judgment: Appears intact Brief Interview for Mental Status (BIMS) Repetition of Three Words (First Attempt): 3 Temporal Orientation: Year: Correct Temporal Orientation: Month: Accurate within 5 days Temporal Orientation: Day: Correct Recall: "Sock": Yes, no cue required Recall: "Blue": Yes, no cue required Recall: "Bed": Yes, no cue required BIMS Summary Score:  15 Sensation Sensation Light Touch: Appears Intact Hot/Cold: Appears Intact Proprioception: Impaired by gross assessment (potential L U/LE deficits; will continue to assess) Stereognosis: Not tested Coordination Gross Motor Movements are Fluid and Coordinated: No Fine Motor Movements are Fluid and Coordinated: No Coordination and Movement Description: balance and coordination deficit 2/2 L hemiplegia Finger Nose Finger Test: Smooth equal movements on R, unable to complete on L Motor  Motor Motor: Hemiplegia Motor - Skilled Clinical Observations: balance deficits 2/2 hemiplgia with L hemibody weakness  Trunk/Postural Assessment  Cervical Assessment Cervical Assessment: Within Functional Limits Thoracic Assessment Thoracic Assessment: Within Functional Limits Lumbar Assessment Lumbar Assessment: Within Functional Limits Postural Control Postural Control: Deficits on evaluation Righting Reactions: delayed Protective Responses: delayed  Balance Balance Balance Assessed: Yes Static Sitting Balance Static Sitting - Balance Support: Feet supported Static Sitting - Level of Assistance: 5: Stand by assistance Dynamic Sitting Balance Dynamic Sitting - Balance Support: Feet supported Dynamic Sitting - Level of Assistance: 5: Stand by assistance Static Standing Balance Static Standing - Balance Support: During functional activity;Left upper extremity supported Static Standing - Level of Assistance: 4: Min assist Dynamic Standing Balance Dynamic Standing - Balance Support: Left upper extremity supported;During functional activity Dynamic Standing - Level of Assistance: 4: Min assist Extremity/Trunk Assessment RUE Assessment RUE Assessment: Within Functional Limits LUE Assessment LUE Assessment: Exceptions to [facility] Passive Range of Motion (PROM) Comments: [facility] General Strength Comments: Grip 3/5, digit extension 3-/5, wrist flex/ext 3-/5, bicep/tricep 3/5, scapular  protraction/tratraction 3-/5, shoudler 2+/5  Care Tool Care Tool Self Care Eating   Eating Assist Level: Set up assist    Oral Care    Oral Care Assist Level: Minimal Assistance - Patient > 75%    Bathing   Body parts bathed by patient: Left arm;Chest;Abdomen;Front perineal area;Buttocks;Right upper leg;Left upper leg;Face;Right lower leg;Left lower leg Body parts bathed by helper: Right arm;Right lower leg;Left lower leg   Assist Level: Minimal Assistance - Patient > 75%    Upper Body Dressing(including orthotics)   What is the patient wearing?: Pull over shirt   Assist Level: Minimal Assistance - Patient > 75%    Lower Body Dressing (excluding footwear)   What is the patient wearing?: Pants Assist for lower body dressing: Minimal Assistance - Patient > 75%    Putting on/Taking off footwear   What is the patient wearing?: Socks Assist for footwear: Supervision/Verbal cueing       Care Tool Toileting Toileting activity   Assist for toileting: Minimal Assistance - Patient > 75%     Care Tool Bed Mobility Roll left and right activity   Roll left and right assist level: Supervision/Verbal cueing    Sit to lying activity   Sit to lying assist level: Minimal Assistance - Patient > 75%    Lying to sitting on side of bed activity   Lying to sitting on  side of bed assist level: the ability to move from lying on the back to sitting on the side of the bed with no back support.: Minimal Assistance - Patient > 75%     Care Tool Transfers Sit to stand transfer   Sit to stand assist level: Contact Guard/Touching assist    Chair/bed transfer   Chair/bed transfer assist level: Minimal Assistance - Patient > 75%     Toilet transfer   Assist Level: Minimal Assistance - Patient > 75%     Care Tool Cognition  Expression of Ideas and Wants Expression of Ideas and Wants: 3. Some difficulty - exhibits some difficulty with expressing needs and ideas (e.g, some words or finishing  thoughts) or speech is not clear  Understanding Verbal and Non-Verbal Content Understanding Verbal and Non-Verbal Content: 4. Understands (complex and basic) - clear comprehension without cues or repetitions   Memory/Recall Ability Memory/Recall Ability : Current season;That he or she is in a hospital/hospital unit;Staff names and faces   Refer to Care Plan for Long Term Goals  SHORT TERM GOAL WEEK 1 OT Short Term Goal 1 (Week 1): Pt will utilize L UE as a gross assist during BADLs with min A OT Short Term Goal 2 (Week 1): Pt will complete toilet transfers CGA usign LRAD OT Short Term Goal 3 (Week 1): Pt will complete grooming/hygine tasks with set-up A using hemi-techniques PRN OT Short Term Goal 4 (Week 1): Pt will complete LB dressing using LRAD and hemi techniques PRN with CGA  Recommendations for other services: Therapeutic Recreation  Pet therapy, Kitchen group, Stress management, and Outing/community reintegration   Skilled Therapeutic Intervention Skilled Therapeutic Interventions/Progress Updates:  1:1 OT evaluation and intervention initiated with skilled education provided on OT role, goals, and POC. Pt received reclined in bed presenting to be in good spirits receptive to skilled OT session reporting 0/10 pain- OT offering intermittent rest breaks, repositioning, and therapeutic support to optimize participation in therapy session. Pt's father present throughout session. Education provided on CVA etiology/recovery process, IPR scheduling, fall prevention, energy conservation, and hemi-techniques for completing BADLs with Pt and Pt's father receptive to education. Pt completed BADLs at levels listed below this session. Pt mildly tearful during session d/t impact of CVA on Pt's functional status with therapeutic support provided. Pt was left resting in chair with call bell in reach, chair alarm on, and all needs met.   ADL ADL Eating: Set up Where Assessed-Eating: Chair Grooming:  Moderate assistance Where Assessed-Grooming: Chair Upper Body Bathing: Minimal assistance Where Assessed-Upper Body Bathing: Shower Lower Body Bathing: Minimal assistance Where Assessed-Lower Body Bathing: Shower Upper Body Dressing: Minimal assistance Where Assessed-Upper Body Dressing: Edge of bed Lower Body Dressing: Minimal assistance Where Assessed-Lower Body Dressing: Edge of bed Toileting: Minimal assistance Where Assessed-Toileting: Teacher, adult education: Curator Method: Proofreader: Engineer, technical sales: Not assessed Film/video editor: Insurance underwriter Method: Designer, industrial/product: Grab bars Mobility  Bed Mobility Bed Mobility: Rolling Right;Rolling Left;Supine to Sit;Sit to Supine Rolling Right: Independent with assistive device Rolling Left: Independent with assistive device Supine to Sit: Minimal Assistance - Patient > 75%;Contact Guard/Touching assist Sit to Supine: Contact Guard/Touching assist;Minimal Assistance - Patient > 75% Transfers Sit to Stand: Contact Guard/Touching assist Stand to Sit: Contact Guard/Touching assist   Discharge Criteria: Patient will be discharged from OT if patient refuses treatment 3 consecutive times without medical reason, if treatment goals not met, if there is a  change in medical status, if patient makes no progress towards goals or if patient is discharged from hospital.  The above assessment, treatment plan, treatment alternatives and goals were discussed and mutually agreed upon: by patient and by family  Clide Deutscher 01/13/2024, 12:55 PM

## 2024-01-13 NOTE — Plan of Care (Signed)
  Problem: RH Swallowing Goal: LTG Patient will consume least restrictive diet using compensatory strategies with assistance (SLP) Description: LTG:  Patient will consume least restrictive diet using compensatory strategies with assistance (SLP) Flowsheets (Taken 01/13/2024 1246) LTG: Pt Patient will consume least restrictive diet using compensatory strategies with assistance of (SLP): Modified Independent   Problem: RH Expression Communication Goal: LTG Patient will increase speech intelligibility (SLP) Description: LTG: Patient will increase speech intelligibility at word/phrase/conversation level with cues, % of the time (SLP) Flowsheets (Taken 01/13/2024 1246) LTG: Patient will increase speech intelligibility (SLP): Modified Independent Level: Conversation level Percent of time patient will use intelligible speech: 100   Problem: RH Problem Solving Goal: LTG Patient will demonstrate problem solving for (SLP) Description: LTG:  Patient will demonstrate problem solving for basic/complex daily situations with cues  (SLP) Flowsheets (Taken 01/13/2024 1246) LTG: Patient will demonstrate problem solving for (SLP): Complex daily situations LTG Patient will demonstrate problem solving for: Modified Independent   Problem: RH Attention Goal: LTG Patient will demonstrate this level of attention during functional activites (SLP) Description: LTG:  Patient will will demonstrate this level of attention during functional activites (SLP) Flowsheets (Taken 01/13/2024 1246) Patient will demonstrate during cognitive/linguistic activities the attention type of:  Sustained  Selective LTG: Patient will demonstrate this level of attention during cognitive/linguistic activities with assistance of (SLP): Modified Independent

## 2024-01-13 NOTE — Progress Notes (Signed)
 Inpatient Rehabilitation Center Individual Statement of Services  Patient Name:  Faith Jordan  Date:  01/13/2024  Welcome to the Inpatient Rehabilitation Center.  Our goal is to provide you with an individualized program based on your diagnosis and situation, designed to meet your specific needs.  With this comprehensive rehabilitation program, you will be expected to participate in at least 3 hours of rehabilitation therapies Monday-Friday, with modified therapy programming on the weekends.  Your rehabilitation program will include the following services:  Physical Therapy (PT), Occupational Therapy (OT), Speech Therapy (ST), 24 hour per day rehabilitation nursing, Therapeutic Recreaction (TR), Neuropsychology, Care Coordinator, Rehabilitation Medicine, Nutrition Services, and Pharmacy Services  Weekly team conferences will be held on Wednesday to discuss your progress.  Your Inpatient Rehabilitation Care Coordinator will talk with you frequently to get your input and to update you on team discussions.  Team conferences with you and your family in attendance may also be held.  Expected length of stay: 14-16 days  Overall anticipated outcome: supervision cues  Depending on your progress and recovery, your program may change. Your Inpatient Rehabilitation Care Coordinator will coordinate services and will keep you informed of any changes. Your Inpatient Rehabilitation Care Coordinator's name and contact numbers are listed  below.  The following services may also be recommended but are not provided by the Inpatient Rehabilitation Center:  Driving Evaluations Home Health Rehabiltiation Services Outpatient Rehabilitation Services Vocational Rehabilitation   Arrangements will be made to provide these services after discharge if needed.  Arrangements include referral to agencies that provide these services.  Your insurance has been verified to be:  [employer] Your primary doctor is:  Vernona Rieger  Pertinent information will be shared with your doctor and your insurance company.  Inpatient Rehabilitation Care Coordinator:  Dossie Der, [place] [phone] or Luna Glasgow  Information discussed with and copy given to patient by: Lucy Chris, 01/13/2024, 10:44 AM

## 2024-01-13 NOTE — Progress Notes (Signed)
 Inpatient Rehabilitation Care Coordinator Assessment and Plan Patient Details  Name: Faith Jordan MRN: [id number] Date of Birth: 04/01/1996  Today's Date: 01/13/2024  Hospital Problems: Principal Problem:   Stroke of right basal ganglia [facility]) Active Problems:   Cerebrovascular accident (CVA) of right basal ganglia [facility])  Past Medical History:  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   Acute flank pain 06/02/2022   Angio-edema 04/27/2016   Angioedema    Aquagenic angio-edema-urticaria    Asthma    no problems /not used recently   Dysautonomia (HCC)    Dysrhythmia    sinus tachycardia   Fibroid    right breast, adenoma   Headache(784.0)    History of COVID-19 11/21/2020   Hives 12/22/2015   Pneumonia    hx  6th grade   Sensation of fullness in both ears 02/02/2023   Snoring 02/11/2022   Urinary frequency 02/12/2019   Vaginal yeast infection 06/02/2022   Vasculitis (HCC)    Past Surgical History:  Past Surgical History:  Procedure Laterality Date   ADENOIDECTOMY     BREAST LUMPECTOMY Right    BREAST LUMPECTOMY Right    MASS EXCISION Right 10/07/2014   Procedure: EXCISION OF RIGHT BREAST MASS;  Surgeon: Avel Peace, MD;  Location: [facility] OR;  Service: General;  Laterality: Right;   TONSILLECTOMY AND ADENOIDECTOMY     TOOTH EXTRACTION     TRANSESOPHAGEAL ECHOCARDIOGRAM (CATH LAB) N/A 01/10/2024   Procedure: TRANSESOPHAGEAL ECHOCARDIOGRAM;  Surgeon: Jake Bathe, MD;  Location: [facility] INVASIVE CV LAB;  Service: Cardiovascular;  Laterality: N/A;   Social History:  reports that she has never smoked. She has never used smokeless tobacco. She reports that she does not drink alcohol and does not use drugs.  Family / Support Systems Marital Status: Single Patient Roles: Parent, Other (Comment) (employee) Other Supports: Enrigue Catena [phone]  Dwight-dad [phone] Anticipated Caregiver: Parents Ability/Limitations of Caregiver:  Parents can provide 24/7 care if needed. Pt plans to go to their home when discharged from here Caregiver Availability: 24/7 Family Dynamics: Close with parents and friends, she feels she has good supports and not concerned about. She wants to make a lot of progress while here  Social History Preferred language: English Religion: [employer] Cultural Background: NA Education: Personal assistant - How often do you need to have someone help you when you read instructions, pamphlets, or other written material from your doctor or pharmacy?: Never Writes: Yes Employment Status: Employed Name of Employer: [employer] Return to Work Plans: Hopes to return to work once is recovered from this stroke Marine scientist Issues: NA Guardian/Conservator: NA-according to MD pt is capable of making her own decisions while here. Her parents will be here daily and provide support   Abuse/Neglect Abuse/Neglect Assessment Can Be Completed: Yes Physical Abuse: Denies Verbal Abuse: Denies Sexual Abuse: Denies Exploitation of patient/patient's resources: Denies Self-Neglect: Denies  Patient response to: Social Isolation - How often do you feel lonely or isolated from those around you?: Never  Emotional Status Pt's affect, behavior and adjustment status: Pt is motivated to do well here she has always been very independent and taken care of herself. 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 She is still trying to process all of this. Recent Psychosocial Issues: hx of migraines Psychiatric History: No history with her young age would benefit from seeing neuro-psych while here and have placed on the ilst for next week  Substance Abuse History: NA  Patient / Family Perceptions, Expectations & Goals Pt/Family understanding of illness & functional limitations: Pt is able to explain her stroke and the deficits she has. She is still processing all of this and does talk with the MD's involved and  feels understands her plan moving forward. 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, nurse, friend, etc Anticipated changes in roles/activities/participation: resume Pt/family expectations/goals: Pt states: " I hope to recover and get back to work eventually."  [employer]: None Premorbid Home Care/DME Agencies: None Transportation available at discharge: self and parents Is the patient able to respond to transportation needs?: Yes In the past 12 months, has lack of transportation kept you from medical appointments or from getting medications?: No In the past 12 months, has lack of transportation kept you from meetings, work, or from getting things needed for daily living?: No Resource referrals recommended: Neuropsychology  Discharge Planning Living Arrangements: Alone Support Systems: Parent, Friends/neighbors Type of Residence: Private residence Insurance Resources: Media planner (specify) Pharmacist, community) Financial Resources: Employment Surveyor, quantity Screen Referred: No Living Expenses: Psychologist, sport and exercise Management: Patient Does the patient have any problems obtaining your medications?: No Home Management: self Patient/Family Preliminary Plans: Plans to go home with parents where she will have 24/7 care if needed. She is aware she is being evaluated today and goals being set for stay here. Have given her the FMLA/STD papers completed by PA here and Mom will look over and get back with worker to fax. Care Coordinator Anticipated Follow Up Needs: HH/OP  Clinical Impression Young female who is here for a CVA still processing it and working on her deficits. She is a Engineer, civil (consulting) so understands all discussed. Her parents are involved and will be assisting at discharge. Will await evaluations and work on discharge needs. Have placed on neuro-psych list to be seen next week.  Lucy Chris 01/13/2024,  10:43 AM

## 2024-01-13 NOTE — Evaluation (Signed)
 Speech Language Pathology Assessment and Plan  Patient Details  Name: Faith Jordan MRN: [id number] Date of Birth: Nov 17, 1995  SLP Diagnosis: Dysarthria;Dysphagia;Cognitive Impairments  Rehab Potential: Excellent ELOS: 12-14 days    Today's Date: 01/13/2024 SLP Individual Time: 1103-1200 SLP Individual Time Calculation (min): 57 min   Hospital Problem: Principal Problem:   Stroke of right basal ganglia (HCC) Active Problems:   Cerebrovascular accident (CVA) of right basal ganglia (HCC)  Past Medical History:  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   Acute flank pain 06/02/2022   Angio-edema 04/27/2016   Angioedema    Aquagenic angio-edema-urticaria    Asthma    no problems /not used recently   Dysautonomia (HCC)    Dysrhythmia    sinus tachycardia   Fibroid    right breast, adenoma   Headache(784.0)    History of COVID-19 11/21/2020   Hives 12/22/2015   Pneumonia    hx  6th grade   Sensation of fullness in both ears 02/02/2023   Snoring 02/11/2022   Urinary frequency 02/12/2019   Vaginal yeast infection 06/02/2022   Vasculitis (HCC)    Past Surgical History:  Past Surgical History:  Procedure Laterality Date   ADENOIDECTOMY     BREAST LUMPECTOMY Right    BREAST LUMPECTOMY Right    MASS EXCISION Right 10/07/2014   Procedure: EXCISION OF RIGHT BREAST MASS;  Surgeon: Avel Peace, MD;  Location: [facility] OR;  Service: General;  Laterality: Right;   TONSILLECTOMY AND ADENOIDECTOMY     TOOTH EXTRACTION     TRANSESOPHAGEAL ECHOCARDIOGRAM (CATH LAB) N/A 01/10/2024   Procedure: TRANSESOPHAGEAL ECHOCARDIOGRAM;  Surgeon: Jake Bathe, MD;  Location: [facility] INVASIVE CV LAB;  Service: Cardiovascular;  Laterality: N/A;    Assessment / Plan / Recommendation Clinical Impression HPI: Faith Jordan is a 28 y.o. 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  Last known well at 8:00 PM on 01/06/2024.  She initially thought her left-sided weakness were related to her hemiplegic migraine.  However, they usually involve her face only and not other parts of her body.  She took her migraine medications, but her hemiaplasia persisted so she came to the ED. In the ER, noncontrast head CT showed no acute intracranial abnormality, dense calcification within the globus pallidus bilaterally, nonspecific finding that can be seen in the setting of Fahr's disease.  CT angio head and neck showing no evidence of LVO.  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 CTA head & neck Normal CTA of the neck. Normal CTA Circle of Willis without significant proximal stenosis, aneurysm, or branch vessel occlusion.as [place] TCD Bubble negative for right-to-left shunt, Vas [place] LE negative for DVT, Hypercoagulable panel negative so far and ANA negative 2D Echo EF 60-65%.  Left atrial size normal.    Clinical Impression:  Bedside Swallow Evaluation: A bedside swallow evaluation was completed to assess for s/sx of oropharyngeal dysphagia. Patients oral mechanism exam revealed reduced lingual ROM and strength, as well as mild L facial asymmetry. POs offered included thin  liquids and solids. Patient with moderately prolonged mastication and complaints "feeling tired" during meals. SLP educated patient this is likely due to oral weakness, and recommended choosing softer foods and eating small, more frequent meals. No s/sx of aspiration across consistencies.  Recommend continuation of current diet (regular/thin) with medications whole in water. Patient should follow standardized swallowing precautions including small bites/sips, slow rate, and sitting upright at 90 degrees.  Communication: Receptive and expressive language WFL Cognition: Patients cognitive linguistic skills were evaluated via the RBANS (repeatable battery for the assessment of neuropsychological status). Patient presents with average immediate memory, visual spatial skills, language and delayed memory, however exhibits mild deficits in attention and high level problem solving. 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  Dysarthria: Patient presents with mild dysarthria characterized by imprecise articulation likely due to oral weakness (specifically lingual). Patient is approximately 90% intelligible at the conversational level with articulatory errors with /w/ and /r/. Pt would benefit from skilled ST services to maximize swallowing safety, speech intelligibility and cognition in order to maximize functional independence at d/c. Anticipate patient will require 24 hour supervision at d/c and f/u SLP services.    Skilled Therapeutic Interventions          Patient evaluated using a standardized cognitive linguistic assessment and bedside swallow evaluation to assess current cognitive, communicative and swallowing function. See above for details.    SLP Assessment  Patient will need skilled Speech Lanaguage Pathology Services during CIR admission    Recommendations  SLP Diet Recommendations: Age appropriate regular solids;Thin Liquid Administration via: Straw;Cup Medication Administration: Whole meds with liquid Supervision: Patient able to self feed Compensations: Slow rate;Small sips/bites Postural Changes and/or  Swallow Maneuvers: Seated upright 90 degrees Oral Care Recommendations: Oral care BID Recommendations for Other Services: Neuropsych consult Patient destination: Home Follow up Recommendations: Outpatient SLP Equipment Recommended: None recommended by SLP    SLP Frequency 1 to 3 out of 7 days   SLP Duration  SLP Intensity  SLP Treatment/Interventions 12-14 days  Minumum of 1-2 x/day, 30 to 90 minutes  Cognitive remediation/compensation;Dysphagia/aspiration precaution training;Internal/external aids;Speech/Language facilitation;Cueing hierarchy;Therapeutic Activities;Functional tasks;Patient/family education    Pain None reported   SLP Evaluation Cognition Overall Cognitive Status: Impaired/Different from baseline  Arousal/Alertness: Awake/alert  Orientation Level: Oriented X4 Year: 2025 Month: March Day of Week: Correct Attention: Selective;Alternating (mild attendtion deficit) Sustained Attention: Impaired Sustained Attention Impairment: Verbal complex;Functional complex Selective Attention: Appears intact Memory: Appears intact  Awareness: Appears intact Problem Solving: Impaired (impaired for higher level IADLs or work related tasks) Problem Solving Impairment: Verbal complex;Functional complex Behaviors: Other (comment) (tearful) Safety/Judgment: Appears intact  Comprehension Auditory Comprehension Overall Auditory Comprehension: Appears within functional limits for tasks assessed Expression Expression Primary Mode of Expression: Verbal Verbal Expression Overall Verbal Expression: Appears within functional limits for tasks assessed Written Expression Dominant Hand: Right Oral Motor Oral Motor/Sensory Function Overall Oral Motor/Sensory Function: Mild impairment Facial ROM: Reduced left;Suspected CN VII (facial) dysfunction Facial Symmetry: Abnormal symmetry left;Suspected CN VII (facial) dysfunction Lingual ROM: Reduced left;Suspected CN XII (hypoglossal)  dysfunction Lingual Symmetry: Abnormal symmetry left;Suspected CN XII (hypoglossal) dysfunction Lingual Strength: Reduced Velum: Within Functional Limits Mandible: Within Functional Limits Motor Speech Overall Motor Speech: Impaired Respiration: Within functional limits Phonation: Normal Resonance: Within functional limits Articulation: Impaired Level of Impairment: Conversation Intelligibility: Intelligible Motor Planning: Witnin functional limits Effective  Techniques: Over-articulate  Care Tool Care Tool Cognition Ability to hear (with hearing aid or hearing appliances if normally used Ability to hear (with hearing aid or hearing appliances if normally used): 0. Adequate - no difficulty in normal conservation, social interaction, listening to TV   Expression of Ideas and Wants Expression of Ideas and Wants: 3. Some difficulty - exhibits some difficulty with expressing needs and ideas (e.g, some words or finishing thoughts) or speech is not clear   Understanding Verbal and Non-Verbal Content Understanding Verbal and Non-Verbal Content: 4. Understands (complex and basic) - clear comprehension without cues or repetitions  Memory/Recall Ability Memory/Recall Ability : Current season;That he or she is in a hospital/hospital unit;Staff names and faces    Bedside Swallowing Assessment General Diet Prior to this Study: Regular;Thin liquids (Level 0) Respiratory Status: Room air Behavior/Cognition: Alert;Cooperative Oral Cavity - Dentition: Adequate natural dentition Self-Feeding Abilities: Able to feed self Patient Positioning: Upright in chair/Tumbleform Baseline Vocal Quality: Normal Volitional Cough: Strong Volitional Swallow: Able to elicit  Ice Chips Ice chips: Not tested Thin Liquid Thin Liquid: Within functional limits Presentation: Straw;Self Fed;Cup Nectar Thick Nectar Thick Liquid: Not tested Honey Thick Honey Thick Liquid: Not tested Puree Puree: Not  tested Solid Solid: Impaired Presentation: Self Fed Oral Phase Impairments: Impaired mastication Oral Phase Functional Implications: Impaired mastication BSE Assessment Risk for Aspiration Impact on safety and function: Mild aspiration risk Other Related Risk Factors:  (lingual weakness and incoordination)  Short Term Goals: Week 1: SLP Short Term Goal 1 (Week 1): Patient will utilize swallowing compensatory strategies during consumption of regular/thin diet with supervision multimodal A SLP Short Term Goal 2 (Week 1): Patient will participate in lingual strengthening exercises to increase articulatory precision given supervision multimodal A SLP Short Term Goal 3 (Week 1): Patient will demonstrate problem solving in complex functional situations given supervision A SLP Short Term Goal 4 (Week 1): Patient will demonstrate sustained attention to tasks for upwards of 20 minutes given supervision multimodal A  Refer to Care Plan for Long Term Goals  Recommendations for other services: None   Discharge Criteria: Patient will be discharged from SLP if patient refuses treatment 3 consecutive times without medical reason, if treatment goals not met, if there is a change in medical status, if patient makes no progress towards goals or if patient is discharged from hospital.  The above assessment, treatment plan, treatment alternatives and goals were discussed and mutually agreed upon: by patient and by family  Cadyn Fann M.A., CCC-SLP 01/13/2024, 12:49 PM

## 2024-01-13 NOTE — Progress Notes (Signed)
 Inpatient Rehabilitation  Patient information reviewed and entered into eRehab system by Cheri Rous, OTR/L, Rehab Quality Coordinator.   Information including medical coding, functional ability and quality indicators will be reviewed and updated through discharge.

## 2024-01-14 ENCOUNTER — Encounter (HOSPITAL_COMMUNITY): Payer: Self-pay | Admitting: Physical Medicine & Rehabilitation

## 2024-01-14 DIAGNOSIS — E876 Hypokalemia: Secondary | ICD-10-CM

## 2024-01-14 DIAGNOSIS — I6381 Other cerebral infarction due to occlusion or stenosis of small artery: Secondary | ICD-10-CM | POA: Diagnosis not present

## 2024-01-14 LAB — POTASSIUM: Potassium: 3.7 mmol/L (ref 3.5–5.1)

## 2024-01-14 NOTE — Progress Notes (Signed)
 Speech Language Pathology Daily Session Note  Patient Details  Name: Faith Jordan MRN: [id number] Date of Birth: May 12, 1996  Today's Date: 01/14/2024 SLP Individual Time: 1345-1430 SLP Individual Time Calculation (min): 45 min  Short Term Goals: Week 1: SLP Short Term Goal 1 (Week 1): Patient will utilize swallowing compensatory strategies during consumption of regular/thin diet with supervision multimodal A SLP Short Term Goal 2 (Week 1): Patient will participate in lingual strengthening exercises to increase articulatory precision given supervision multimodal A SLP Short Term Goal 3 (Week 1): Patient will demonstrate problem solving in complex functional situations given supervision A SLP Short Term Goal 4 (Week 1): Patient will demonstrate sustained attention to tasks for upwards of 20 minutes given supervision multimodal A  Skilled Therapeutic Interventions: Skilled therapy session focused on communication and cognitive goals. SLP facilitated session by evaluating patients lingual strength through use of IOPI. Patient with an average anterior lingual strength of 27.4 kpa and posterior of 23.6kpa indicating significant weakness. SLP to initiate use of IOPI during sessions to increase lingual strength, as well as encourage patient to complete exercises outside of therapy. SLP targeted cognitive goals through discussion of basic nursing topics including vital signs, protocols and assigning acuity. Patient answered all questions independently. Patient left in bed with alarm set and call bell in reach. Continue POC.   Pain Pain Assessment Pain Scale: 0-10 Pain Score: 0-No pain  Therapy/Group: Individual Therapy  Desi Carby M.A., CCC-SLP 01/14/2024, 7:45 AM

## 2024-01-14 NOTE — Evaluation (Signed)
 Physical Therapy Assessment and Plan  Patient Details  Name: Faith Jordan MRN: [id number] Date of Birth: Mar 09, 1996  PT Diagnosis: Difficulty walking, Hemiparesis non-dominant, Hypotonia, Impaired sensation, and Pain in L shoulder/ elbow Rehab Potential: Good ELOS: 14-16 days   Today's Date: 01/13/2024 PT Individual Time: 6962-9528  PT Individual Time Calculation (min): 73 min  Hospital Problem: Principal Problem:   Stroke of right basal ganglia (HCC) Active Problems:   Cerebrovascular accident (CVA) of right basal ganglia (HCC)   Past Medical History:  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   Acute flank pain 06/02/2022   Angio-edema 04/27/2016   Angioedema    Aquagenic angio-edema-urticaria    Asthma    no problems /not used recently   Dysautonomia (HCC)    Dysrhythmia    sinus tachycardia   Fibroid    right breast, adenoma   Headache(784.0)    History of COVID-19 11/21/2020   Hives 12/22/2015   Pneumonia    hx  6th grade   Sensation of fullness in both ears 02/02/2023   Snoring 02/11/2022   Urinary frequency 02/12/2019   Vaginal yeast infection 06/02/2022   Vasculitis (HCC)    Past Surgical History:  Past Surgical History:  Procedure Laterality Date   ADENOIDECTOMY     BREAST LUMPECTOMY Right    BREAST LUMPECTOMY Right    MASS EXCISION Right 10/07/2014   Procedure: EXCISION OF RIGHT BREAST MASS;  Surgeon: Avel Peace, MD;  Location: [facility] OR;  Service: General;  Laterality: Right;   TONSILLECTOMY AND ADENOIDECTOMY     TOOTH EXTRACTION     TRANSESOPHAGEAL ECHOCARDIOGRAM (CATH LAB) N/A 01/10/2024   Procedure: TRANSESOPHAGEAL ECHOCARDIOGRAM;  Surgeon: Jake Bathe, MD;  Location: [facility] INVASIVE CV LAB;  Service: Cardiovascular;  Laterality: N/A;    Assessment & Plan Clinical Impression: Patient is a 28 y.o. 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 Lucia Gaskins, on birth control, angioedema 2017,dysautonomia. Per chart review patient lives alone. Independent prior to admission. Working as a Systems analyst at [employer]. She plans to stay with her mother on discharge. Presented 01/07/2024 with acute onset of left-sided weakness and facial droop. She denied any headache. Cranial CT scan showed no acute intracranial abnormality. CTA of head and neck unremarkable. Patient did not receive tPA. 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 Admission chemistries unremarkable except WBC of 12,700, urine drug screen negative. Beta-2 glycoprotein and lupus anticoagulant negative. Protein S and C activity and normal Antithrombin III normal. Echocardiogram with ejection fraction of 60 to 65% no wall motion abnormalities. TEE completed showing ejection fraction 65% no atrial appendage or thrombus. She is currently maintained on Plavix for CVA prophylaxis (allergy to NSAIDs thus was not placed on aspirin.. Lovenox for DVT prophylaxis. She currently remains on Topamax for headaches. Therapy evaluations completed due to patient decreased functional mobility left-sided weakness was admitted for a comprehensive rehab program. Patient transferred to CIR on 01/12/2024 .   Patient currently requires min assist with mobility secondary to muscle weakness, decreased cardiorespiratoy endurance, impaired timing and sequencing, unbalanced muscle activation, and decreased coordination, decreased attention to left and decreased motor planning, decreased attention and decreased safety awareness, and decreased sitting balance, decreased standing balance, decreased postural control, decreased balance strategies,  and hemipareisis .  Prior to hospitalization, patient was independent  with mobility  and lived with Alone (Pta lives alone. Pt to d/c to her parents home - info reflects parents' home.) in a House home.  Home access is level entry but at parents' home will have 3Stairs to enter.  Patient will benefit from skilled PT intervention to maximize safe functional mobility, minimize fall risk, and decrease caregiver burden for planned discharge home with 24 hour supervision.  Anticipate patient will benefit from follow up OP at discharge.  PT - End of Session Activity Tolerance: Tolerates 30+ min activity with multiple rests Endurance Deficit: Yes PT Assessment Rehab Potential (ACUTE/IP ONLY): Good PT Barriers to Discharge: Inaccessible home environment;Lack of/limited family support;Insurance for SNF coverage PT Patient demonstrates impairments in the following area(s): Balance;Edema;Endurance;Motor;Perception;Safety;Sensory PT Transfers Functional Problem(s): Bed Mobility;Bed to Chair;Car;Furniture;Floor PT Locomotion Functional Problem(s): Ambulation;Stairs PT Plan PT Intensity: Minimum of 1-2 x/day ,45 to 90 minutes PT Frequency: 5 out of 7 days PT Duration Estimated Length of Stay: 14-16 days PT Treatment/Interventions: Ambulation/gait training;Cognitive remediation/compensation;Discharge planning;DME/adaptive equipment instruction;Functional mobility training;Pain management;Psychosocial support;Splinting/orthotics;Therapeutic Activities;UE/LE Strength taining/ROM;Visual/perceptual remediation/compensation;UE/LE Runner, broadcasting/film/video;Therapeutic Exercise;Patient/family education;Neuromuscular re-education;Functional electrical stimulation;Disease management/prevention;Community reintegration;Balance/vestibular training PT Transfers Anticipated Outcome(s): Mod I PT Locomotion Anticipated Outcome(s): Mod I/ supervision PT Recommendation Recommendations for Other Services: Therapeutic Recreation consult Therapeutic Recreation Interventions: Pet therapy;Kitchen  group;Stress management;Outing/community reintergration Follow Up Recommendations: Outpatient PT;24 hour supervision/assistance Patient destination: Home Equipment Recommended: To be determined   PT Evaluation Precautions/Restrictions Precautions Precautions: Fall Precaution/Restrictions Comments: L hemipareisis Restrictions Weight Bearing Restrictions Per Provider Order: No General   Vital SignsTherapy Vitals Temp: 98.2 F (36.8 C) Pulse Rate: 72 Resp: 18 BP: 102/65 Patient Position (if appropriate): Lying Oxygen Therapy SpO2: 100 % O2 Device: Room Air Pain Pain Assessment Pain Scale: 0-10 Pain Score: 0-No pain Pain Interference Pain Interference Pain Effect on Sleep: 0. Does not apply - I have not had any pain or hurting in the past 5 days Pain Interference with Therapy Activities: 0. Does not apply - I have not received rehabilitationtherapy in the past 5 days Pain Interference with Day-to-Day Activities: 1. Rarely or not at all Home Living/Prior Functioning Home Living Available Help at Discharge: Family;Available 24 hours/day Type of Home: House Home Access: Stairs to enter [employer] of Steps: 3 Entrance Stairs-Rails: Right;Left (can use one rail at a time) Home Layout: Multi-level;Able to live on main level with bedroom/bathroom Bathroom Shower/Tub: Health visitor: Standard Bathroom Accessibility: Yes Additional Comments: History for mother's house where she will d/c. Pt has dog, "Letti", great dane.  Lives With: Alone (Pta lives alone. Pt to d/c to her parents home - info reflects parents' home.) Prior Function Level of Independence: Independent with gait;Independent with basic ADLs;Independent with homemaking with ambulation;Independent with transfers  Able to Take Stairs?: Yes Driving: Yes Vocation: Full time employment Vision/Perception  Vision - History Ability to See in Adequate Light: 1 Impaired Vision - Assessment Eye  Alignment: Within Functional Limits Ocular Range of Motion: Within Functional Limits Alignment/Gaze Preference: Within Defined Limits Tracking/Visual Pursuits: Able to track stimulus in all quads without difficulty Perception Perception: Within Functional Limits Praxis Praxis: Impaired Praxis Impairment Details: Motor planning  Cognition Overall Cognitive Status: Impaired/Different from baseline Arousal/Alertness: Awake/alert Orientation Level: Oriented X4 Attention: Alternating;Selective Selective Attention: Impaired Alternating Attention: Impaired Memory: Appears intact Awareness: Appears intact Problem Solving: Impaired Behaviors: Impulsive Safety/Judgment: Appears intact Sensation Sensation Light Touch: Appears Intact Hot/Cold: Appears Intact Proprioception: Impaired by gross assessment (potential LUE  deficits; will continue to assess) Coordination Gross Motor Movements are Fluid and Coordinated: No Fine Motor Movements are Fluid and Coordinated: No Coordination and Movement Description: balance and coordination deficit 2/2 L hemiplegia Heel Shin Test: RLE WFL; LLE slow, focused performance Motor  Motor Motor: Hemiplegia Motor - Skilled Clinical Observations: balance deficits 2/2 hemiplgia with L hemibody weakness   Trunk/Postural Assessment  Cervical Assessment Cervical Assessment: Within Functional Limits Thoracic Assessment Thoracic Assessment: Within Functional Limits Lumbar Assessment Lumbar Assessment: Within Functional Limits Postural Control Postural Control: Deficits on evaluation Righting Reactions: delayed Protective Responses: delayed  Balance Balance Balance Assessed: Yes Standardized Balance Assessment Standardized Balance Assessment: Berg Balance Test Berg Balance Test Sit to Stand: Able to stand using hands after several tries Standing Unsupported: Able to stand 2 minutes with supervision Sitting with Back Unsupported but Feet Supported on  Floor or Stool: Able to sit safely and securely 2 minutes Stand to Sit: Controls descent by using hands Transfers: Able to transfer with verbal cueing and /or supervision Standing Unsupported with Eyes Closed: Able to stand 10 seconds with supervision Standing Ubsupported with Feet Together: Able to place feet together independently and stand for 1 minute with supervision From Standing, Reach Forward with Outstretched Arm: Can reach forward >12 cm safely (5") From Standing Position, Pick up Object from Floor: Able to pick up shoe, needs supervision From Standing Position, Turn to Look Behind Over each Shoulder: Looks behind one side only/other side shows less weight shift Turn 360 Degrees: Able to turn 360 degrees safely but slowly Standing Unsupported, Alternately Place Feet on Step/Stool: Able to complete >2 steps/needs minimal assist Standing Unsupported, One Foot in Front: Able to take small step independently and hold 30 seconds Standing on One Leg: Tries to lift leg/unable to hold 3 seconds but remains standing independently Total Score: 35 Static Sitting Balance Static Sitting - Balance Support: Feet supported Static Sitting - Level of Assistance: 5: Stand by assistance Dynamic Sitting Balance Dynamic Sitting - Balance Support: Feet supported Dynamic Sitting - Level of Assistance: Other (comment) (CGA) Static Standing Balance Static Standing - Balance Support: During functional activity Static Standing - Level of Assistance: 4: Min assist Dynamic Standing Balance Dynamic Standing - Balance Support: During functional activity Dynamic Standing - Level of Assistance: 4: Min assist;3: Mod assist Extremity Assessment      RLE Assessment RLE Assessment: Within Functional Limits LLE Assessment LLE Assessment: Exceptions to [facility] LLE Strength LLE Overall Strength: Deficits Left Hip Flexion: 3-/5 Left Hip Extension: 3/5 Left Hip ABduction: 4-/5 Left Hip ADduction: 4-/5 Left Knee  Flexion: 4-/5 Left Knee Extension: 3-/5 Left Ankle Dorsiflexion: 3-/5 Left Ankle Plantar Flexion: 3-/5  Care Tool Care Tool Bed Mobility Roll left and right activity   Roll left and right assist level: Supervision/Verbal cueing    Sit to lying activity   Sit to lying assist level: Contact Guard/Touching assist    Lying to sitting on side of bed activity   Lying to sitting on side of bed assist level: the ability to move from lying on the back to sitting on the side of the bed with no back support.: Contact Guard/Touching assist     Care Tool Transfers Sit to stand transfer   Sit to stand assist level: Minimal Assistance - Patient > 75%    Chair/bed transfer   Chair/bed transfer assist level: Moderate Assistance - Patient 50 - 74%    Car transfer   Car transfer assist level: Contact Guard/Touching assist;Minimal Assistance - Patient >  75%      Care Tool Locomotion Ambulation   Assist level: Minimal Assistance - Patient > 75% Assistive device: No Device Max distance: 250 ft  Walk 10 feet activity   Assist level: Minimal Assistance - Patient > 75% Assistive device: No Device   Walk 50 feet with 2 turns activity   Assist level: Minimal Assistance - Patient > 75% Assistive device: No Device  Walk 150 feet activity   Assist level: Minimal Assistance - Patient > 75% Assistive device: No Device  Walk 10 feet on uneven surfaces activity Walk 10 feet on uneven surfaces activity did not occur: Safety/medical concerns      Stairs   Assist level: Minimal Assistance - Patient > 75% Stairs assistive device: 1 hand rail Max number of stairs: 12  Walk up/down 1 step activity   Walk up/down 1 step (curb) assist level: Minimal Assistance - Patient > 75% Walk up/down 1 step or curb assistive device: 1 hand rail  Walk up/down 4 steps activity   Walk up/down 4 steps assist level: Minimal Assistance - Patient > 75% Walk up/down 4 steps assistive device: 1 hand rail  Walk up/down 12  steps activity   Walk up/down 12 steps assist level: Minimal Assistance - Patient > 75% Walk up/down 12 steps assistive device: 1 hand rail  Pick up small objects from floor   Pick up small object from the floor assist level: Contact Guard/Touching assist (used R hand)    Wheelchair Is the patient using a wheelchair?: No Type of Wheelchair: Manual Wheelchair activity did not occur: Refused      Wheel 50 feet with 2 turns activity Wheelchair 50 feet with 2 turns activity did not occur: Refused    Wheel 150 feet activity Wheelchair 150 feet activity did not occur: Refused      Refer to Care Plan for Long Term Goals  SHORT TERM GOAL WEEK 1 PT Short Term Goal 1 (Week 1): Pt will perform standing transfers with decreased impulsivity and overall CGA. PT Short Term Goal 2 (Week 1): Pt will ambulate community distances with overall CGA/ supervision and no LLE hyperextension. PT Short Term Goal 3 (Week 1): Pt will perform stairs with 1HR and overall supervision. PT Short Term Goal 4 (Week 1): Pt will improve Berg Balance score by at least 5 points.  Recommendations for other services: Therapeutic Recreation  Pet therapy, Kitchen group, Stress management, and Outing/community reintegration  Skilled Therapeutic Intervention Mobility Bed Mobility Bed Mobility: Rolling Right;Rolling Left;Supine to Sit;Sit to Supine Rolling Right: Independent with assistive device Rolling Left: Independent with assistive device Supine to Sit: Contact Guard/Touching assist Sit to Supine: Contact Guard/Touching assist Transfers Transfers: Stand to Sit;Sit to Stand;Stand Pivot Transfers Sit to Stand: Contact Guard/Touching assist;Minimal Assistance - Patient > 75% Stand to Sit: Contact Guard/Touching assist Stand Pivot Transfers: Minimal Assistance - Patient > 75% Stand Pivot Transfer Details: Verbal cues for precautions/safety;Verbal cues for technique Transfer (Assistive device): 1 person hand held  assist Locomotion  Gait Ambulation: Yes Gait Assistance: Minimal Assistance - Patient > 75%;Contact Guard/Touching assist Gait Distance (Feet): 250 Feet Assistive device: None Gait Assistance Details: Verbal cues for gait pattern;Verbal cues for precautions/safety;Tactile cues for weight shifting;Tactile cues for posture Gait Gait: Yes Gait Pattern: Impaired Gait Pattern: Step-through pattern;Left circumduction;Decreased hip/knee flexion - left;Poor foot clearance - left;Left flexed knee in stance Ankle - Stance Phase - Impaired Gait Pattern: Decreased push off/heel off - Left Knee - Stance Phase - Impaired Gait Pattern: Extensor  thrust - Left;Increased knee flexion - Left Pelvis - Stance Phase - Impaired Gait Pattern: Decreased weight shift - Left;Ipsilateral drop Knee - Swing Phase- Impaired Gait Pattern: Decreased flexion - Left Hip - Swing Phase- Impaired Gait Pattern: Circumduction - Left Pelvis - Swing Phase- Impaired Gait Pattern: Ipsilateral drop (L) Gait velocity: decreased Stairs / Additional Locomotion Stairs: Yes Stairs Assistance: Minimal Assistance - Patient > 75% Stair Management Technique: One rail Right;Two rails;Step to pattern Number of Stairs: 12 Height of Stairs: 6 Wheelchair Mobility Wheelchair Mobility: No  Skilled Intervention: PT Evaluation completed; see above for results. PT educated patient in roles of PT vs OT, PT POC, rehab potential, rehab goals, and discharge recommendations along with recommendation for follow-up rehabilitation services. Individual treatment initiated:  Patient supine in bed upon PT arrival. Patient alert and agreeable to PT session.   No pain complaint at start of session.  Therapeutic Activity: Bed Mobility: Patient performed supine <> sit with CGA and Min LOB to L side but is able to recover with increased effort. Provided vc/ tc for taking time in performing learned tasks. Transfers: Patient performed sit <> stand transfers  throughout session with impulsivity and attempt to ambulate as soon as standing up. Provided vc/ tc again and throughout session forpausing on stance to ensure balance prior to adding more movements/ walking.  Gait Training:  Patient ambulated >200' x1/ 150' x1/ 250' x1 using no AD with up to MinA. Demonstrated extensor thrust during stance phase on LLE, circumduction of LLE during stance phase and depressed L side of pelvis throughout. Provided vc/tc for increasing hip/ knee flexion of LLE, heel strike, holding L knee in slight flexion during stance phase. Slightly improves throughout session but does continue to require intermittent vc.   Stair training with pt completing with MinA. Pt uses R HR throughout, leads with RLE to ascend and step-to with LLE. Leads with LLE to descend and vc throughout for full foot position to step. Completes 3sets of 4 steps and improves speed of performance by final bout. Speed increased to point where pt feels comfortable and starts to descend with RLE first, but demos good motor control of LLE.  Neuromuscular Re-ed: NMR facilitated during session with focus on standing balance. Pt guided in Berg Balance assessment. Patient demonstrates increased fall risk as noted by score of  35 /56 on Berg Balance Scale.  (<36= high risk for falls, close to 100%; 37-45 significant >80%; 46-51 moderate >50%; 52-55 lower >25%)  NMR performed for improvements in motor control and coordination, balance, sequencing, judgement, and self confidence/ efficacy in performing all aspects of mobility at highest level of independence.   While seated after Berg, pt relates pain in L elbow. L shoulder noted to demo mild/ mod subluxation and improvement in pain noted with repositioning of humeral head. Provided pt with sling using short stretch wrap with great improvement in pain.   Patient supine in bed at end of session with brakes locked, bed alarm set, and all needs within reach.   Discharge  Criteria: Patient will be discharged from PT if patient refuses treatment 3 consecutive times without medical reason, if treatment goals not met, if there is a change in medical status, if patient makes no progress towards goals or if patient is discharged from hospital.  The above assessment, treatment plan, treatment alternatives and goals were discussed and mutually agreed upon: by patient  Loel Dubonnet PT, DPT, CSRS 01/14/2024, 5:28 AM

## 2024-01-14 NOTE — Progress Notes (Addendum)
 PROGRESS NOTE   Subjective/Complaints:  Pt doing well, slept well, pain well managed, LBM yesterday per pt (had some looser stools the day before but yesterday not as much). Urinating fine. On menses. Denies any other complaints or concerns.   ROS- see HPI. neg CP, SOB, abd pain, N/V/D  Objective:   No results found. Recent Labs    01/12/24 1743 01/13/24 0655  WBC 11.4* 10.1  HGB 16.1* 15.5*  HCT 46.3* 45.7  PLT 385 343   Recent Labs    01/12/24 1743 01/13/24 0655 01/14/24 0534  NA  --  140  --   K  --  3.2* 3.7  CL  --  106  --   CO2  --  24  --   GLUCOSE  --  100*  --   BUN  --  10  --   CREATININE 0.87 0.85  --   CALCIUM  --  9.2  --     Intake/Output Summary (Last 24 hours) at 01/14/2024 1119 Last data filed at 01/13/2024 1843 Gross per 24 hour  Intake 480 ml  Output --  Net 480 ml        Physical Exam: Vital Signs Blood pressure 102/65, pulse 72, temperature 98.2 F (36.8 C), resp. rate 18, height 5\' 7"  (1.702 m), weight 75 kg, SpO2 100%.   General: No acute distress, sitting at EOB.  Mood and affect are appropriate Heart: Regular rate and rhythm no rubs murmurs or extra sounds Lungs: Clear to auscultation, breathing unlabored, no rales or wheezes Abdomen: Positive bowel sounds, soft nontender to palpation, nondistended Extremities: No clubbing, cyanosis, or edema Skin: No evidence of breakdown, no evidence of rash over exposed surfaces.  [name]: L arm weakness, L leg weakness Neuro: A&O x3  PRIOR EXAMS: Neurologic: Cranial nerves II through XII intact, motor strength is 5/5 in right and 3-/5 left deltoid, bicep, tricep, grip, 5/5 RIght and 4- left hip flexor, knee extensors, 5/5 righ tand 3- left ankle dorsiflexor and plantar flexor Tone 3+ biceps reflex Sustained clonus Left ankle PF Sensory exam normal sensation to light touch  in bilateral upper and lower extremities Cerebellar exam limited  by weakness LUE  Musculoskeletal: Full range of motion in all 4 extremities. No joint swelling   Assessment/Plan: 1. Functional deficits which require 3+ hours per day of interdisciplinary therapy in a comprehensive inpatient rehab setting. Physiatrist is providing close team supervision and 24 hour management of active medical problems listed below. Physiatrist and rehab team continue to assess barriers to discharge/monitor patient progress toward functional and medical goals  Care Tool:  Bathing    Body parts bathed by patient: Left arm, Chest, Abdomen, Front perineal area, Buttocks, Right upper leg, Left upper leg, Face, Right lower leg, Left lower leg   Body parts bathed by helper: Right arm, Right lower leg, Left lower leg     Bathing assist Assist Level: Minimal Assistance - Patient > 75%     Upper Body Dressing/Undressing Upper body dressing   What is the patient wearing?: Pull over shirt    Upper body assist Assist Level: Minimal Assistance - Patient > 75%    Lower Body  Dressing/Undressing Lower body dressing      What is the patient wearing?: Pants     Lower body assist Assist for lower body dressing: Minimal Assistance - Patient > 75%     Toileting Toileting    Toileting assist Assist for toileting: Minimal Assistance - Patient > 75%     Transfers Chair/bed transfer  Transfers assist     Chair/bed transfer assist level: Moderate Assistance - Patient 50 - 74%     Locomotion Ambulation   Ambulation assist      Assist level: Minimal Assistance - Patient > 75% Assistive device: No Device Max distance: 250 ft   Walk 10 feet activity   Assist     Assist level: Minimal Assistance - Patient > 75% Assistive device: No Device   Walk 50 feet activity   Assist    Assist level: Minimal Assistance - Patient > 75% Assistive device: No Device    Walk 150 feet activity   Assist    Assist level: Minimal Assistance - Patient >  75% Assistive device: No Device    Walk 10 feet on uneven surface  activity   Assist Walk 10 feet on uneven surfaces activity did not occur: Safety/medical concerns         Wheelchair     Assist Is the patient using a wheelchair?: No Type of Wheelchair: Manual Wheelchair activity did not occur: Refused         Wheelchair 50 feet with 2 turns activity    Assist    Wheelchair 50 feet with 2 turns activity did not occur: Refused       Wheelchair 150 feet activity     Assist  Wheelchair 150 feet activity did not occur: Refused       Blood pressure 102/65, pulse 72, temperature 98.2 F (36.8 C), resp. rate 18, height 5\' 7"  (1.702 m), weight 75 kg, SpO2 100%.  Medical Problem List and Plan: 1. 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  Antithrombotics: -DVT/anticoagulation:  Pharmaceutical: Lovenox 40mg  daily -antiplatelet therapy: Plavix 75 mg daily.  Patient with allergy to NSAIDs thus was not placed on aspirin 3. Pain Management: Lido Derm patch 4. Mood/Behavior/Sleep: Prozac 40 mg daily, melatonin 5 mg nightly, Ativan 1 mg every 6 hours as needed anxiety, trazodone 100mg  nightly             -antipsychotic agents: N/A 5. Neuropsych/cognition: This patient is capable of making decisions on her own behalf. 6. Skin/Wound Care: Routine skin check 7. Fluids/Electrolytes/Nutrition: Continue sodium bicarb 650mg  TID. Routine in and outs with follow-up chemistries  -01/14/24 hypokalemia yesterday now resolved, K 3.7; monitor weekly 8.  Current History of hemiplegic migraines.  Followed by neurology service Dr. Lucia Gaskins.  Continue Topamax 50mg  daily +50mg  daily PRN- per pt, took off Emgality (which is due ~3/28) as well as Nurtec- she's wondering what  can take when has migraine 9.  Hyperlipidemia.  Lipitor 80mg  daily 10. Spasticity: LLE> LUE, sustained clonus at the ankle will order PRAFO  11. 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 Bowel management: continue fibercon and acidophilus, imodium 1mg  q6h PRN, miralax daily PRN -01/14/24 had  looser stools 2 days ago, not as much yesterday, LBM yesterday, monitor BMs for now-- on menses which could be contributing. Advised BRAT foods. No stool softeners scheduled for now. Monitor.     LOS: 2 days A FACE TO FACE EVALUATION WAS PERFORMED  [address] 01/14/2024, 11:19 AM

## 2024-01-14 NOTE — Progress Notes (Signed)
 Occupational Therapy Session Note  Patient Details  Name: Faith Jordan MRN: [id number] Date of Birth: 23-Jan-1996  Today's Date: 01/14/2024 OT Individual Time: [phone]  OT Individual Time Calculation (min): 60 min    Short Term Goals: Week 1:  OT Short Term Goal 1 (Week 1): Pt will utilize L UE as a gross assist during BADLs with min A OT Short Term Goal 2 (Week 1): Pt will complete toilet transfers CGA usign LRAD OT Short Term Goal 3 (Week 1): Pt will complete grooming/hygine tasks with set-up A using hemi-techniques PRN OT Short Term Goal 4 (Week 1): Pt will complete LB dressing using LRAD and hemi techniques PRN with CGA  Skilled Therapeutic Interventions/Progress Updates:  Session 1: Pt greeted supine in bed, pt agreeable to OT intervention.      Transfers/bed mobility/functional mobility: pt completed supine>sit with supervision. Ambulatory ADL transfers with no AD and CGA- MINA.    ADLs:  Grooming: pt completed standing oral care at sink with supervision, good use of hemi strategies.  UB dressing:pt donned [place] shirt with light MINA, assist needed to don bra LB dressing: pt donned tight leggings with MINA, education provided on wearing loose clothes for ease with dresssing. Pt donned brief with light MIN A to manage L side. Education provided on "hooking" L thumb into brief  Bathing: pt completed bathing from sitting/standing with CGA  Ended session with pt supine in bed with all needs within reach and bed alarm activated.                   Session 2:  Pt greeted supine in bed, pt agreeable to OT intervention.    Mother present during session.   Transfers/bed mobility/functional mobility: pt completed bed mobility with supervision. Pt completed functional ambulation from room<>gym with no AD and CGA.    ADLs:  Grooming: pt completed standing hand hygiene at sink with supervision.  Toileting: pt completed 3/3 toileting tasks with CGA, continent urine void.    NMR: pt completed various NMR to facilitate improved LUE active ROM and motor planning.  Pt seated EOM with LUE positioned in UE ranger, pt instructed to complete scapular protraction/retraction and elbow flexion/extension with mirror provided for visual feedback. Pt having difficulty not compensating with accessory muscles during task and pt not wanting to look in mirror for visual feedback d/t pt not "liking the way she looks."   Secured LUE to 1lb dowel rod with pt complete active assist ROM therex via shoulder flexion to 90* and elbow flexion/extension with an emphasis on bimanual tasks. Pt again not wanting to use mirror for visual feedback therefore provided MAX tactile cues to decrease accessory muscle compensation.   Worked on digit flexion/extension with pt grabbing wash cloths from sitting position at table, pt with decreased ability to extend digits at this time. Issue pt compliant cube to work on functional grasp and release in room.   1:1 NMES applied to L wrist flexors at the below settings:    Ratio 1:3 Rate 35 pps Waveform- Asymmetric Ramp 1.0 Pulse 300 Intensity- 12  Duration -   15 mins  Had pt grasp washcloth in L hand and bring wrist into extension and then work on digit extension and wrist flexion when channel was activated. Good activation noted in wrist during task. No pain or adverse reactions noted during treatment.   Education: educated pt and mom on active assist ROM therex that can be completed in between therapies.   Ended  session with pt supine in bed with all needs within reach and bed alarm activated.                     Therapy Documentation Precautions:  Precautions Precautions: Fall Precaution/Restrictions Comments: L hemipareisis Restrictions Weight Bearing Restrictions Per Provider Order: No  Pain: no pain reported during either session     Therapy/Group: Individual Therapy  [name] 01/14/2024, 12:18 PM

## 2024-01-15 DIAGNOSIS — E876 Hypokalemia: Secondary | ICD-10-CM | POA: Diagnosis not present

## 2024-01-15 DIAGNOSIS — I6381 Other cerebral infarction due to occlusion or stenosis of small artery: Secondary | ICD-10-CM | POA: Diagnosis not present

## 2024-01-15 NOTE — Plan of Care (Signed)
  Problem: Consults Goal: RH STROKE PATIENT EDUCATION Description: See Patient Education module for education specifics  Outcome: Progressing   Problem: RH SAFETY Goal: RH STG ADHERE TO SAFETY PRECAUTIONS W/ASSISTANCE/DEVICE Description: STG Adhere to Safety Precautions With cues Assistance/Device. Outcome: Progressing   Problem: RH PAIN MANAGEMENT Goal: RH STG PAIN MANAGED AT OR BELOW PT'S PAIN GOAL Description: < 4 with prns Outcome: Progressing   Problem: RH KNOWLEDGE DEFICIT Goal: RH STG INCREASE KNOWLEGDE OF HYPERLIPIDEMIA Description: Patient and family will be able to manage HLD using educational resources for medications and dietary modifications independently Outcome: Progressing Goal: RH STG INCREASE KNOWLEDGE OF STROKE PROPHYLAXIS Description: Patient and family will be able to manage secondary stroke risks using educational resources for medications and dietary modifications independently Outcome: Progressing

## 2024-01-15 NOTE — IPOC Note (Signed)
 Overall Plan of Care [facility]) Patient Details Name: Faith Jordan MRN: [id number] DOB: 1995-12-25  Admitting Diagnosis: Stroke of right basal ganglia [facility])  Hospital Problems: Principal Problem:   Stroke of right basal ganglia (HCC) Active Problems:   Cerebrovascular accident (CVA) of right basal ganglia (HCC)     Functional Problem List: Nursing Pain, Endurance, Medication Management, Safety, Bowel  PT Balance, Edema, Endurance, Motor, Perception, Safety, Sensory  OT Balance, Perception, Motor, Endurance, Vision, Edema, Cognition, Sensory, Safety  SLP Cognition, Linguistic, Nutrition  TR         Basic ADL's: OT Grooming, Bathing, Dressing, Toileting     Advanced  ADL's: OT Light Housekeeping, Simple Meal Preparation, Laundry     Transfers: PT Bed Mobility, Bed to Chair, Set designer, [employer], Dietitian, Technical brewer: PT Ambulation, Stairs     Additional Impairments: OT Fuctional Use of Upper Extremity  SLP Social Cognition, Swallowing   Problem Solving, Attention  TR      Anticipated Outcomes Item Anticipated Outcome  Self Feeding Mod I  Swallowing  mod i   Basic self-care  Mod I-superivsion  Toileting  Mod I   Bathroom Transfers Mod I  Bowel/Bladder  manage bowel w mod I assist  Transfers  Mod I  Locomotion  Mod I/ supervision  Communication  mod i  Cognition  mod i  Pain  manage pain < 4 with prns  Safety/Judgment  manage safety w cues   Therapy Plan: PT Intensity: Minimum of 1-2 x/day ,45 to 90 minutes PT Frequency: 5 out of 7 days PT Duration Estimated Length of Stay: 14-16 days OT Intensity: Minimum of 1-2 x/day, 45 to 90 minutes OT Frequency: 5 out of 7 days OT Duration/Estimated Length of Stay: 14-16 days SLP Intensity: Minumum of 1-2 x/day, 30 to 90 minutes SLP Frequency: 1 to 3 out of 7 days SLP Duration/Estimated Length of Stay: 12-14 days   Team Interventions: Nursing Interventions Patient/Family Education, Pain  Management, Medication Management, Discharge Planning, Bowel Management, Disease Management/Prevention  PT interventions Ambulation/gait training, Cognitive remediation/compensation, Discharge planning, DME/adaptive equipment instruction, Functional mobility training, Pain management, Psychosocial support, Splinting/orthotics, Therapeutic Activities, UE/LE Strength taining/ROM, Visual/perceptual remediation/compensation, UE/LE Coordination activities, Stair training, Therapeutic Exercise, Patient/family education, Neuromuscular re-education, Functional electrical stimulation, Disease management/prevention, Firefighter, Warden/ranger  OT Interventions Warden/ranger, Discharge planning, Functional electrical stimulation, Pain management, Self Care/advanced ADL retraining, Therapeutic Activities, UE/LE Coordination activities, Cognitive remediation/compensation, Disease mangement/prevention, Functional mobility training, Patient/family education, Skin care/wound managment, Therapeutic Exercise, Visual/perceptual remediation/compensation, Firefighter, Fish farm manager, Neuromuscular re-education, Psychosocial support, Splinting/orthotics, UE/LE Strength taining/ROM, Wheelchair propulsion/positioning  SLP Interventions Cognitive remediation/compensation, Dysphagia/aspiration precaution training, Internal/external aids, Speech/Language facilitation, Cueing hierarchy, Therapeutic Activities, Functional tasks, Patient/family education  TR Interventions    SW/CM Interventions Discharge Planning, Psychosocial Support, Patient/Family Education   Barriers to Discharge MD  Medical stability, Home enviroment access/loayout, Lack of/limited family support, and Insurance for SNF coverage  Nursing Decreased caregiver support, Home environment Architect level/ main B+B, 3ste bil rails ; mother to assist at discharge  PT Inaccessible home  environment, Lack of/limited family support, Community education officer for SNF coverage    OT      SLP      SW       Team Discharge Planning: Destination: PT-Home ,OT- Home , SLP-Home Projected Follow-up: PT-Outpatient PT, 24 hour supervision/assistance, OT-  Outpatient OT, SLP-Outpatient SLP Projected Equipment Needs: PT-To be determined, OT- To be determined, SLP-None recommended by SLP Equipment Details:  PT- , OT-  Patient/family involved in discharge planning: PT- Patient,  OT-Patient, Family member/caregiver, SLP-Patient, Family member/caregiver  MD ELOS: 14-16 days Medical Rehab Prognosis:  Good Assessment: The patient has been admitted for CIR therapies with the diagnosis of R internal capsule and basal ganglia CVA. The team will be addressing functional mobility, strength, stamina, balance, safety, adaptive techniques and equipment, self-care, bowel and bladder mgt, patient and caregiver education,. Goals have been set at supervision PT, OT. Anticipated discharge destination is home.       See Team Conference Notes for weekly updates to the plan of care

## 2024-01-15 NOTE — Progress Notes (Signed)
 PROGRESS NOTE   Subjective/Complaints:  Pt doing well again today, slept well, pain well managed, LBM this morning. Urinating fine. On menses. Denies any other complaints or concerns.   ROS- see HPI. neg CP, SOB, abd pain, N/V/D  Objective:   No results found. Recent Labs    01/12/24 1743 01/13/24 0655  WBC 11.4* 10.1  HGB 16.1* 15.5*  HCT 46.3* 45.7  PLT 385 343   Recent Labs    01/12/24 1743 01/13/24 0655 01/14/24 0534  NA  --  140  --   K  --  3.2* 3.7  CL  --  106  --   CO2  --  24  --   GLUCOSE  --  100*  --   BUN  --  10  --   CREATININE 0.87 0.85  --   CALCIUM  --  9.2  --     Intake/Output Summary (Last 24 hours) at 01/15/2024 1030 Last data filed at 01/15/2024 0800 Gross per 24 hour  Intake 358 ml  Output --  Net 358 ml        Physical Exam: Vital Signs Blood pressure (!) 102/57, pulse 68, temperature 98 F (36.7 C), resp. rate 18, height 5\' 7"  (1.702 m), weight 75 kg, SpO2 99%.   General: No acute distress, resting in bed comfortably.  Mood and affect are appropriate Heart: Regular rate and rhythm no rubs murmurs or extra sounds Lungs: Clear to auscultation, breathing unlabored, no rales or wheezes Abdomen: Positive bowel sounds, soft nontender to palpation, nondistended Extremities: No clubbing, cyanosis, or edema Skin: No evidence of breakdown, no evidence of rash over exposed surfaces.  [name]: L arm weakness, L leg weakness Neuro: A&O x3  PRIOR EXAMS: Neurologic: Cranial nerves II through XII intact, motor strength is 5/5 in right and 3-/5 left deltoid, bicep, tricep, grip, 5/5 RIght and 4- left hip flexor, knee extensors, 5/5 righ tand 3- left ankle dorsiflexor and plantar flexor Tone 3+ biceps reflex Sustained clonus Left ankle PF Sensory exam normal sensation to light touch  in bilateral upper and lower extremities Cerebellar exam limited by weakness LUE  Musculoskeletal: Full  range of motion in all 4 extremities. No joint swelling   Assessment/Plan: 1. Functional deficits which require 3+ hours per day of interdisciplinary therapy in a comprehensive inpatient rehab setting. Physiatrist is providing close team supervision and 24 hour management of active medical problems listed below. Physiatrist and rehab team continue to assess barriers to discharge/monitor patient progress toward functional and medical goals  Care Tool:  Bathing    Body parts bathed by patient: Left arm, Chest, Abdomen, Front perineal area, Buttocks, Right upper leg, Left upper leg, Face, Right lower leg, Left lower leg   Body parts bathed by helper: Right arm, Right lower leg, Left lower leg     Bathing assist Assist Level: Minimal Assistance - Patient > 75%     Upper Body Dressing/Undressing Upper body dressing   What is the patient wearing?: Pull over shirt    Upper body assist Assist Level: Minimal Assistance - Patient > 75%    Lower Body Dressing/Undressing Lower body dressing  What is the patient wearing?: Pants     Lower body assist Assist for lower body dressing: Minimal Assistance - Patient > 75%     Toileting Toileting    Toileting assist Assist for toileting: Minimal Assistance - Patient > 75%     Transfers Chair/bed transfer  Transfers assist     Chair/bed transfer assist level: Moderate Assistance - Patient 50 - 74%     Locomotion Ambulation   Ambulation assist      Assist level: Minimal Assistance - Patient > 75% Assistive device: No Device Max distance: 250 ft   Walk 10 feet activity   Assist     Assist level: Minimal Assistance - Patient > 75% Assistive device: No Device   Walk 50 feet activity   Assist    Assist level: Minimal Assistance - Patient > 75% Assistive device: No Device    Walk 150 feet activity   Assist    Assist level: Minimal Assistance - Patient > 75% Assistive device: No Device    Walk 10 feet  on uneven surface  activity   Assist Walk 10 feet on uneven surfaces activity did not occur: Safety/medical concerns         Wheelchair     Assist Is the patient using a wheelchair?: No Type of Wheelchair: Manual Wheelchair activity did not occur: Refused         Wheelchair 50 feet with 2 turns activity    Assist    Wheelchair 50 feet with 2 turns activity did not occur: Refused       Wheelchair 150 feet activity     Assist  Wheelchair 150 feet activity did not occur: Refused       Blood pressure (!) 102/57, pulse 68, temperature 98 F (36.7 C), resp. rate 18, height 5\' 7"  (1.702 m), weight 75 kg, SpO2 99%.  Medical Problem List and Plan: 1. 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  Antithrombotics: -DVT/anticoagulation:  Pharmaceutical: Lovenox 40mg  daily -antiplatelet therapy: Plavix 75 mg daily.  Patient with allergy to NSAIDs thus was not placed on aspirin 3. Pain Management: Lido Derm patch 4. Mood/Behavior/Sleep: Prozac 40 mg daily, melatonin 5 mg nightly, Ativan 1 mg every 6 hours as needed anxiety, trazodone 100mg  nightly             -antipsychotic agents: N/A 5. Neuropsych/cognition: This patient is capable of making decisions on her own behalf. 6. Skin/Wound Care: Routine skin check 7. Fluids/Electrolytes/Nutrition: Continue sodium bicarb 650mg  TID. Routine in and outs with follow-up chemistries  -01/14/24 hypokalemia yesterday now resolved, K 3.7; monitor weekly 8.  Current History of hemiplegic migraines.  Followed by neurology service Dr. Lucia Gaskins.  Continue Topamax 50mg  daily +50mg  daily PRN- per pt, took off Emgality (which is due ~3/28) as well as Nurtec- she's wondering what can take when has migraine 9.  Hyperlipidemia.   Lipitor 80mg  daily 10. Spasticity: LLE> LUE, sustained clonus at the ankle will order [facility]; weekday team to discuss if meds needed, pt inquiring 11. 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 Bowel management: continue fibercon and acidophilus, imodium 1mg  q6h PRN, miralax daily PRN -01/14/24 had  looser stools 2 days ago, not as much yesterday, LBM yesterday, monitor BMs for now-- on menses which could be contributing. Advised BRAT foods. No stool softeners scheduled for now. Monitor.  -01/15/24 LBM this AM, more formed; monitor    LOS: 3 days A FACE TO FACE EVALUATION WAS PERFORMED  [address] 01/15/2024, 10:30 AM

## 2024-01-15 NOTE — Plan of Care (Signed)
  Problem: RH SAFETY Goal: RH STG ADHERE TO SAFETY PRECAUTIONS W/ASSISTANCE/DEVICE Description: STG Adhere to Safety Precautions With cues Assistance/Device. Outcome: Progressing   Problem: RH PAIN MANAGEMENT Goal: RH STG PAIN MANAGED AT OR BELOW PT'S PAIN GOAL Description: < 4 with prns Outcome: Progressing   Problem: RH KNOWLEDGE DEFICIT Goal: RH STG INCREASE KNOWLEDGE OF STROKE PROPHYLAXIS Description: Patient and family will be able to manage secondary stroke risks using educational resources for medications and dietary modifications independently Outcome: Progressing

## 2024-01-16 DIAGNOSIS — I6381 Other cerebral infarction due to occlusion or stenosis of small artery: Secondary | ICD-10-CM | POA: Diagnosis not present

## 2024-01-16 DIAGNOSIS — G43709 Chronic migraine without aura, not intractable, without status migrainosus: Secondary | ICD-10-CM | POA: Diagnosis not present

## 2024-01-16 DIAGNOSIS — T384X5S Adverse effect of oral contraceptives, sequela: Secondary | ICD-10-CM | POA: Diagnosis not present

## 2024-01-16 LAB — BASIC METABOLIC PANEL WITH GFR
Anion gap: 11 (ref 5–15)
BUN: 6 mg/dL (ref 6–20)
CO2: 22 mmol/L (ref 22–32)
Calcium: 9.1 mg/dL (ref 8.9–10.3)
Chloride: 109 mmol/L (ref 98–111)
Creatinine, Ser: 0.96 mg/dL (ref 0.44–1.00)
GFR, Estimated: >60 mL/min (ref >60)
Glucose, Bld: 92 mg/dL (ref 70–99)
Potassium: 3.4 mmol/L — ABNORMAL LOW (ref 3.5–5.1)
Sodium: 142 mmol/L (ref 135–145)

## 2024-01-16 LAB — CBC
HCT: 40.3 % (ref 36.0–46.0)
Hemoglobin: 13.7 g/dL (ref 12.0–15.0)
MCH: 28.8 pg (ref 26.0–34.0)
MCHC: 34 g/dL (ref 30.0–36.0)
MCV: 84.8 fL (ref 80.0–100.0)
Platelets: 302 10*3/uL (ref 150–400)
RBC: 4.75 MIL/uL (ref 3.87–5.11)
RDW: 13.1 % (ref 11.5–15.5)
WBC: 8.1 10*3/uL (ref 4.0–10.5)
nRBC: 0 % (ref 0.0–0.2)

## 2024-01-16 MED ORDER — POTASSIUM CHLORIDE CRYS ER 20 MEQ PO TBCR
40.0000 meq | EXTENDED_RELEASE_TABLET | Freq: Once | ORAL | Status: AC
Start: 2024-01-16 — End: 2024-01-16
  Administered 2024-01-16: 40 meq via ORAL
  Filled 2024-01-16: qty 2

## 2024-01-16 NOTE — Progress Notes (Signed)
 Speech Language Pathology Daily Session Note  Patient Details  Name: Faith Jordan MRN: [id number] Date of Birth: [birth year]  Today's Date: 01/16/2024 SLP Individual Time: 9562-1308 SLP Individual Time Calculation (min): 57 min  Short Term Goals: Week 1: SLP Short Term Goal 1 (Week 1): Patient will utilize swallowing compensatory strategies during consumption of regular/thin diet with supervision multimodal A SLP Short Term Goal 2 (Week 1): Patient will participate in lingual strengthening exercises to increase articulatory precision given supervision multimodal A SLP Short Term Goal 3 (Week 1): Patient will demonstrate problem solving in complex functional situations given supervision A SLP Short Term Goal 4 (Week 1): Patient will demonstrate sustained attention to tasks for upwards of 20 minutes given supervision multimodal A  Skilled Therapeutic Interventions: Direct handoff from PT Skilled therapy session focused on communication and cognitive goals. SLP initiated use of IOPI to target lingual strength in order to increase articulatory precision. Patient with maximum anterior and posterior kpa of 33, an improvement since last session. Patient reports practicing outside of therapy, which is evident by today's testing. SLP prompted patient to complete x20 repetitions anteriorly and posteriorly set at 30kpa. SLP targeted cognitive goals through completion of NCLEX nursing questions. Patient answered questions and provided reasoning with supervisionA. Patient left in bed with alarm set and call bell in reach. Continue POC.   Pain None reported to SLP  Therapy/Group: Individual Therapy  Jermy Couper M.A., CCC-SLP 01/16/2024, 12:41 PM

## 2024-01-16 NOTE — Plan of Care (Signed)
  Problem: RH Balance Goal: LTG Patient will maintain dynamic sitting balance (PT) Description: LTG:  Patient will maintain dynamic sitting balance with assistance during mobility activities (PT) Flowsheets (Taken 01/13/2024 1850) LTG: Pt will maintain dynamic sitting balance during mobility activities with:: Independent Goal: LTG Patient will maintain dynamic standing balance (PT) Description: LTG:  Patient will maintain dynamic standing balance with assistance during mobility activities (PT) Flowsheets (Taken 01/13/2024 1850) LTG: Pt will maintain dynamic standing balance during mobility activities with:: Supervision/Verbal cueing   Problem: Sit to Stand Goal: LTG:  Patient will perform sit to stand with assistance level (PT) Description: LTG:  Patient will perform sit to stand with assistance level (PT) Flowsheets (Taken 01/13/2024 1850) LTG: PT will perform sit to stand in preparation for functional mobility with assistance level: Independent with assistive device   Problem: RH Bed Mobility Goal: LTG Patient will perform bed mobility with assist (PT) Description: LTG: Patient will perform bed mobility with assistance, with/without cues (PT). Flowsheets (Taken 01/13/2024 1850) LTG: Pt will perform bed mobility with assistance level of: Independent with assistive device    Problem: RH Bed to Chair Transfers Goal: LTG Patient will perform bed/chair transfers w/assist (PT) Description: LTG: Patient will perform bed to chair transfers with assistance (PT). Flowsheets (Taken 01/13/2024 1850) LTG: Pt will perform Bed to Chair Transfers with assistance level: Supervision/Verbal cueing   Problem: RH Furniture Transfers Goal: LTG Patient will perform furniture transfers w/assist (OT/PT) Description: LTG: Patient will perform furniture transfers  with assistance (OT/PT). Flowsheets (Taken 01/13/2024 1850) LTG: Pt will perform furniture transfers with assist:: Supervision/Verbal cueing   Problem:  RH Ambulation Goal: LTG Patient will ambulate in home environment (PT) Description: LTG: Patient will ambulate in home environment, # of feet with assistance (PT). Flowsheets (Taken 01/13/2024 1850) LTG: Pt will ambulate in home environ  assist needed:: Supervision/Verbal cueing LTG: Ambulation distance in home environment: up to 50 ft per bout using LRAD Goal: LTG Patient will ambulate in community environment (PT) Description: LTG: Patient will ambulate in community environment, # of feet with assistance (PT). Flowsheets (Taken 01/13/2024 1850) LTG: Pt will ambulate in community environ  assist needed:: Supervision/Verbal cueing LTG: Ambulation distance in community environment: more than 400 ft using LRAD   Problem: RH Stairs Goal: LTG Patient will ambulate up and down stairs w/assist (PT) Description: LTG: Patient will ambulate up and down # of stairs with assistance (PT) Flowsheets (Taken 01/13/2024 1850) LTG: Pt will ambulate up/down stairs assist needed:: Supervision/Verbal cueing LTG: Pt will  ambulate up and down number of stairs: at least 4 steps using HR setup as per home environment

## 2024-01-16 NOTE — Progress Notes (Addendum)
 PROGRESS NOTE   Subjective/Complaints:  Pt had questions regarding calcification in brain, reviewed neuro and MRI report, as well as messaging with Dr Pearlean Brownie.  MRI findings equivocal and no family hx so Dr Pearlean Brownie feels pt does not have Fahr's   ROS- see HPI. neg CP, SOB, abd pain, N/V/D  Objective:   No results found. Recent Labs    01/16/24 0527  WBC 8.1  HGB 13.7  HCT 40.3  PLT 302   Recent Labs    01/14/24 0534 01/16/24 0527  NA  --  142  K 3.7 3.4*  CL  --  109  CO2  --  22  GLUCOSE  --  92  BUN  --  6  CREATININE  --  0.96  CALCIUM  --  9.1    Intake/Output Summary (Last 24 hours) at 01/16/2024 0932 Last data filed at 01/15/2024 1800 Gross per 24 hour  Intake 357 ml  Output --  Net 357 ml        Physical Exam: Vital Signs Blood pressure 109/68, pulse 70, temperature 98.1 F (36.7 C), resp. rate 18, height 5\' 7"  (1.702 m), weight 75 kg, SpO2 100%.   General: No acute distress, resting in bed comfortably.  Mood and affect are appropriate Heart: Regular rate and rhythm no rubs murmurs or extra sounds Lungs: Clear to auscultation, breathing unlabored, no rales or wheezes Abdomen: Positive bowel sounds, soft nontender to palpation, nondistended Extremities: No clubbing, cyanosis, or edema Skin: No evidence of breakdown, no evidence of rash over exposed surfaces.  [name]: L arm weakness, L leg weakness Neuro: A&O x3  PRIOR EXAMS: Neurologic: Cranial nerves II through XII intact, motor strength is 5/5 in right and 3-/5 left deltoid, bicep, tricep, grip, 5/5 RIght and 4- left hip flexor, knee extensors, 5/5 right and 3- left ankle dorsiflexor and plantar flexor Tone 3+ biceps reflex Sustained clonus Left ankle PF Sensory exam normal sensation to light touch  in bilateral upper and lower extremities Cerebellar exam limited by weakness LUE  Musculoskeletal: Full range of motion in all 4 extremities. No  joint swelling 1FB subluxation ,    Assessment/Plan: 1. Functional deficits which require 3+ hours per day of interdisciplinary therapy in a comprehensive inpatient rehab setting. Physiatrist is providing close team supervision and 24 hour management of active medical problems listed below. Physiatrist and rehab team continue to assess barriers to discharge/monitor patient progress toward functional and medical goals  Care Tool:  Bathing    Body parts bathed by patient: Left arm, Chest, Abdomen, Front perineal area, Buttocks, Right upper leg, Left upper leg, Face, Right lower leg, Left lower leg   Body parts bathed by helper: Right arm, Right lower leg, Left lower leg     Bathing assist Assist Level: Minimal Assistance - Patient > 75%     Upper Body Dressing/Undressing Upper body dressing   What is the patient wearing?: Pull over shirt    Upper body assist Assist Level: Minimal Assistance - Patient > 75%    Lower Body Dressing/Undressing Lower body dressing      What is the patient wearing?: Pants     Lower body assist Assist  for lower body dressing: Minimal Assistance - Patient > 75%     Toileting Toileting    Toileting assist Assist for toileting: Minimal Assistance - Patient > 75%     Transfers Chair/bed transfer  Transfers assist     Chair/bed transfer assist level: Moderate Assistance - Patient 50 - 74%     Locomotion Ambulation   Ambulation assist      Assist level: Minimal Assistance - Patient > 75% Assistive device: No Device Max distance: 250 ft   Walk 10 feet activity   Assist     Assist level: Minimal Assistance - Patient > 75% Assistive device: No Device   Walk 50 feet activity   Assist    Assist level: Minimal Assistance - Patient > 75% Assistive device: No Device    Walk 150 feet activity   Assist    Assist level: Minimal Assistance - Patient > 75% Assistive device: No Device    Walk 10 feet on uneven surface   activity   Assist Walk 10 feet on uneven surfaces activity did not occur: Safety/medical concerns         Wheelchair     Assist Is the patient using a wheelchair?: No Type of Wheelchair: Manual Wheelchair activity did not occur: Refused         Wheelchair 50 feet with 2 turns activity    Assist    Wheelchair 50 feet with 2 turns activity did not occur: Refused       Wheelchair 150 feet activity     Assist  Wheelchair 150 feet activity did not occur: Refused       Blood pressure 109/68, pulse 70, temperature 98.1 F (36.7 C), resp. rate 18, height 5\' 7"  (1.702 m), weight 75 kg, SpO2 100%.  Medical Problem List and Plan: 1. 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  Antithrombotics: -DVT/anticoagulation:  Pharmaceutical: Lovenox 40mg  daily -antiplatelet therapy: Plavix 75 mg daily.  Patient with allergy to NSAIDs thus was not placed on aspirin 3. Pain Management: Lido Derm patch 4. Mood/Behavior/Sleep: Prozac 40 mg daily, melatonin 5 mg nightly, Ativan 1 mg every 6 hours as needed anxiety, trazodone 100mg  nightly             -antipsychotic agents: N/A 5. Neuropsych/cognition: This patient is capable of making decisions on her own behalf. 6. Skin/Wound Care: Routine skin check 7. Fluids/Electrolytes/Nutrition: Continue sodium bicarb 650mg  TID. Routine in and outs with follow-up chemistries  Pt states she has always had low CO2 levels , did not take bicarb at home, now has low K+ no diuretics, suspect due to bicarb supplementation - d/c bicarb , give 1x dose of KCL and monitor     Latest Ref Rng & Units 01/16/2024    5:27 AM 01/14/2024    5:34 AM 01/13/2024    6:55 AM  BMP  Glucose 70 - 99 mg/dL 92   914   BUN 6 - 20 mg/dL 6   10   Creatinine 7.82 - 1.00 mg/dL 9.56   2.13    Sodium 086 - 145 mmol/L 142   140   Potassium 3.5 - 5.1 mmol/L 3.4  3.7  3.2  Chloride 98 - 111 mmol/L 109   106   CO2 22 - 32 mmol/L 22   24   Calcium 8.9 - 10.3 mg/dL 9.1   9.2     8.  Current History of hemiplegic migraines.  Followed by neurology service Dr. Lucia Gaskins.  Continue Topamax 50mg  daily +50mg  daily PRN- per pt, took off Emgality (which is due ~3/28) as well as Nurtec-  May use prn fioricet 9.  Hyperlipidemia.  Lipitor 80mg  daily 10. 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 Bowel management: continue fibercon and acidophilus, imodium 1mg  q6h PRN, miralax daily PRN   Large type 4 BM   LOS: 4 days A FACE TO FACE EVALUATION WAS PERFORMED  Erick Colace 01/16/2024, 9:32 AM

## 2024-01-16 NOTE — Progress Notes (Signed)
 Occupational Therapy Session Note  Patient Details  Name: Faith Jordan MRN: [id number] Date of Birth: [birth year]  Today's Date: 01/16/2024 OT Individual Time: 0917-1000 OT Individual Time Calculation (min): 43 min  OT Individual Time: 1300-1344 OT Individual Time Calculation (min): 44 min   Short Term Goals: Week 1:  OT Short Term Goal 1 (Week 1): Pt will utilize L UE as a gross assist during BADLs with min A OT Short Term Goal 2 (Week 1): Pt will complete toilet transfers CGA usign LRAD OT Short Term Goal 3 (Week 1): Pt will complete grooming/hygine tasks with set-up A using hemi-techniques PRN OT Short Term Goal 4 (Week 1): Pt will complete LB dressing using LRAD and hemi techniques PRN with CGA  Skilled Therapeutic Interventions/Progress Updates:     AM Session: Pt received semi-reclined in bed presenting to be in good spirits receptive to skilled OT session reporting mild pain in L elbow- OT offering intermittent rest breaks, repositioning, and therapeutic support to optimize participation in therapy session. Pt requesting to take shower this AM. Focused this session on BADL retraining to increase overall independence in BADLs and functional transfers. Pt completed ambulatory transfers this session with CGA no AD. Pt transferred to toilet using grab bar CGA and completed 3/3 toileting tasks CGA without LOB and doffed clothing using hemi-techniques supervision. Pt completed ambulatory transfer to walk-in shower using grab bars with CGA. Engaged Pt in completing U/LB bathing in standing position for increased balance and activity tolerance challenge with Pt able to complete U/LB bathing with CGA, sitting only to wash feet and lower B LEs by crossing legs into figure-four position. Following shower, Pt complete U/LB dressing using hemi-techniques donning shirt with supervision, bra min A, and underwear/pants with CGA +increased time. Pt able to brush teeth in standing position at sink no AD CGA  provided for balance. When donning lotion, Pt was able to stabilize and maintain gross grasp on lotion container with L UE while twisting off cap using R hand demonstrating increased functional use for BADLs!! Pt requesting to return to bed at end of session, EOB>supine CGA. Provided education on L UE postioning in bed to avoid injury and resting hand splint usage at night time with handouts issued to support education. Donned Saebo e-stim onto Pt's anterior/medial deltoid to support increased muscle activation to decrease one finger width shoulder subluxation with anterior displacement. Informed SLP to doffed during upcoming session with no redness or irritation reported. Pt was left resting in bed with call bell in reach, bed alarm on, and all needs met.    PM Session: Pt received sitting up in bed with parents present in room. Pt presenting to be in good spirits receptive to skilled OT session reporting 3/10 pain in L UE- OT offering intermittent rest breaks, repositioning, and therapeutic support to optimize participation in therapy session. Pt's family inquiring about being able to take Pt to bathroom between therapy session.  Provided education on hospital policies including use of bed/chair alarms, gait belt use, call bell, and use of non-slip socks with Pt and Pt's parents receptive to education. Engaged Pt's father in assisting Pt to bathroom with Pt completing supine>EOB with supervision using bed features and ambulatory transfer to bathroom with CGA-light min HHA. Pt requiring CGA to complete 3/3 toileting tasks. Pt able to maintain standing balance while washing hands at sink with CGA no AD. Informed RN and nursing staff Pt's father has participated in training and is safe to assist Pt to bathroom  between therapy sessions. Education also provided on "call don't fall" policy if any assistance is needed, Pt or Pt's parents should utilize call bell.   Pt completed functional mobility to therapy gym no  AD with CGA +increased time required. Focused remainder of session on L UE NMRE to increase overall functional use, improved functional movement patterns, and increase overall independence in BADLs. With L UE positioned on UE ranger, engaged Pt in completed isolated bicep flexion/extension, scapular protraction/retraction, shoulder abduction/adduction, and scapular circumduction clockwise/counterclockwise. Pt with increased shoulder elevation and decreased scapular rotation during exercises with max tactile cues provided to decrease compensatory body movements and facilitate improved ROM and muscle activation with noted improvement. Pt with improved bicep/tricep activation noted this session with 3/5 strength. With hand positioned on wash cloth, engaged Pt in completing shoulder internal/external rotation to improve overall activation of rotator cuff for improved functional reach with consistent tactile cues provided for muscle activation and technique. Pt began emotional and tearful following exercises d/t impact of CVA on her functional status- engaged Pt in light hearted conversation, provided therapeutic support, and provided gentle education on CVA recovery with min improvement in moral noted.   Pt ambulated back to her room CGA no AD and returned to bed with CGA using bed rails. Pt's parents in room to provided additional emotional support. Donned SAEBO onto Pt's L dorsal forearm to facilitate increased wrist and digit extension with noted muscle activation. Informed PT to doff during upcoming session with no redness or irritation reported. Pt was left resting in bed with call bell in reach, bed alarm on, and all needs met.    Therapy Documentation Precautions:  Precautions Precautions: Fall Precaution/Restrictions Comments: L hemipareisis Restrictions Weight Bearing Restrictions Per Provider Order: No   Therapy/Group: Individual Therapy  Clide Deutscher 01/16/2024, 7:51 AM

## 2024-01-16 NOTE — Plan of Care (Signed)
  Problem: Consults Goal: RH STROKE PATIENT EDUCATION Description: See Patient Education module for education specifics  Outcome: Progressing   Problem: RH SAFETY Goal: RH STG ADHERE TO SAFETY PRECAUTIONS W/ASSISTANCE/DEVICE Description: STG Adhere to Safety Precautions With cues Assistance/Device. Outcome: Progressing   Problem: RH PAIN MANAGEMENT Goal: RH STG PAIN MANAGED AT OR BELOW PT'S PAIN GOAL Description: < 4 with prns Outcome: Progressing   Problem: RH KNOWLEDGE DEFICIT Goal: RH STG INCREASE KNOWLEDGE OF STROKE PROPHYLAXIS Description: Patient and family will be able to manage secondary stroke risks using educational resources for medications and dietary modifications independently Outcome: Progressing

## 2024-01-16 NOTE — Progress Notes (Signed)
 Physical Therapy Session Note  Patient Details  Name: Faith Jordan MRN: [id number] Date of Birth: [birth year]  Today's Date: 01/14/2024 PT Individual Time: 0915-1016  PT Individual Time Calculation (min): 61 min  Short Term Goals: Week 1:  PT Short Term Goal 1 (Week 1): Pt will perform standing transfers with decreased impulsivity and overall CGA. PT Short Term Goal 2 (Week 1): Pt will ambulate community distances with overall CGA/ supervision and no LLE hyperextension. PT Short Term Goal 3 (Week 1): Pt will perform stairs with 1HR and overall supervision. PT Short Term Goal 4 (Week 1): Pt will improve Berg Balance score by at least 5 points.  Skilled Therapeutic Interventions/Progress Updates:  Patient supine in bed on entrance to room. Patient alert and agreeable to PT session.   Patient with no pain complaint at start of session. Arm sling donned for comfort to LUE.   Therapeutic Activity: Transfers: Pt performed sit<>stand and stand pivot transfers throughout session with supervision/ CGA. Provided vc/ tc for taking time upon standing for self-assessment of balance prior to initiating ambulation.  Gait Training:  Pt ambulated 250' x2 using no AD with CGA/ int MinA. Demonstrated focus to control extensor thrust during mid and terminal stance on LLE. No circumduction noted this day as pt is also focused on raise of knee for improved hip/ knee flexion. Pt and mom related that pt has been told before that she has a leg length discrepancy as well as a pelvic malalignment. Slightly improves throughout session but does continue to require intermittent vc.   Neuromuscular Re-ed: NMR facilitated during session with focus on standing balance. Pt guided in midline positioning during controlled rise to stand and controlled descent. Pt is able to perform with no UE use and good control but continues to bias toward R side. Guided in LLE step over line on floor to improve mechanics and confidence in  stepping. Then guided in control of knee during stance phase and fwd/ bkwd stepping of RLE.   Guided in resisted concentric and eccentric control of L knee using red t-band with anterior pull, then posterior pull.   Progressed to side and forward lunges with focus on push-off and return to static stance as pt requires 2 steps for full return. Improves throughout.   NMR performed for improvements in motor control and coordination, balance, sequencing, judgement, and self confidence/ efficacy in performing all aspects of mobility at highest level of independence.   Pt then questioning return to work and driving. Related to pt that all will be maintain of long term focus but for now, pt should focus on short term goals of improved quality of movement. Pt will be able to focus on return to driving slowly when reaction time improves and with improved L hemibody movement.   Patient supine in bed at end of session with brakes locked, bed alarm set, and all needs within reach.   Therapy Documentation Precautions:  Precautions Precautions: Fall Precaution/Restrictions Comments: L hemipareisis Restrictions Weight Bearing Restrictions Per Provider Order: No  Pain: No pain related this session.   Therapy/Group: Individual Therapy  Loel Dubonnet PT, DPT, CSRS 01/14/2024, 4:59 PM

## 2024-01-17 DIAGNOSIS — I6381 Other cerebral infarction due to occlusion or stenosis of small artery: Secondary | ICD-10-CM | POA: Diagnosis not present

## 2024-01-17 DIAGNOSIS — G43709 Chronic migraine without aura, not intractable, without status migrainosus: Secondary | ICD-10-CM | POA: Diagnosis not present

## 2024-01-17 DIAGNOSIS — T384X5S Adverse effect of oral contraceptives, sequela: Secondary | ICD-10-CM | POA: Diagnosis not present

## 2024-01-17 NOTE — Progress Notes (Signed)
 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 neg CP, SOB, abd pain, N/V/D  Objective:   No results found. Recent Labs    01/16/24 0527  WBC 8.1  HGB 13.7  HCT 40.3  PLT 302   Recent Labs    01/16/24 0527  NA 142  K 3.4*  CL 109  CO2 22  GLUCOSE 92  BUN 6  CREATININE 0.96  CALCIUM 9.1    Intake/Output Summary (Last 24 hours) at 01/17/2024 0834 Last data filed at 01/16/2024 1700 Gross per 24 hour  Intake 582 ml  Output --  Net 582 ml        Physical Exam: Vital Signs Blood pressure 110/65, pulse 65, temperature 98 F (36.7 C), resp. rate 16, height 5\' 7"  (1.702 m), weight 75 kg, SpO2 100%.   General: No acute distress, resting in bed comfortably.  Mood and affect are appropriate Heart: Regular rate and rhythm no rubs murmurs or extra sounds Lungs: Clear to auscultation, breathing unlabored, no rales or wheezes Abdomen: Positive bowel sounds, soft nontender to palpation, nondistended Extremities: No clubbing, cyanosis, or edema Skin: No evidence of breakdown, no evidence of rash over exposed surfaces.  [name]: L arm weakness, L leg weakness Neuro: A&O x3  PRIOR EXAMS: Neurologic: Cranial nerves II through XII intact, motor strength is 5/5 in right and 3-/5 left deltoid, bicep, tricep, grip, 5/5 RIght and 4- left hip flexor, knee extensors, 5/5 right and 3- left ankle dorsiflexor and plantar flexor  Sensory exam normal sensation to light touch  in bilateral upper and lower extremities Cerebellar exam limited by weakness LUE  Musculoskeletal: Full passive range of motion in all 4 extremities. No joint swelling 1FB subluxation ,    Assessment/Plan: 1. Functional deficits which require 3+ hours per day of interdisciplinary therapy in a comprehensive inpatient rehab setting. Physiatrist is providing close  team supervision and 24 hour management of active medical problems listed below. Physiatrist and rehab team continue to assess barriers to discharge/monitor patient progress toward functional and medical goals  Care Tool:  Bathing    Body parts bathed by patient: Left arm, Chest, Abdomen, Front perineal area, Buttocks, Right upper leg, Left upper leg, Face, Right lower leg, Left lower leg   Body parts bathed by helper: Right arm, Right lower leg, Left lower leg     Bathing assist Assist Level: Minimal Assistance - Patient > 75%     Upper Body Dressing/Undressing Upper body dressing   What is the patient wearing?: Pull over shirt    Upper body assist Assist Level: Minimal Assistance - Patient > 75%    Lower Body Dressing/Undressing Lower body dressing      What is the patient wearing?: Pants     Lower body assist Assist for lower body dressing: Minimal Assistance - Patient > 75%     Toileting Toileting    Toileting assist Assist for toileting: Minimal Assistance - Patient > 75%     Transfers Chair/bed transfer  Transfers assist     Chair/bed transfer assist level: Moderate Assistance -  Patient 50 - 74%     Locomotion Ambulation   Ambulation assist      Assist level: Minimal Assistance - Patient > 75% Assistive device: No Device Max distance: 250 ft   Walk 10 feet activity   Assist     Assist level: Minimal Assistance - Patient > 75% Assistive device: No Device   Walk 50 feet activity   Assist    Assist level: Minimal Assistance - Patient > 75% Assistive device: No Device    Walk 150 feet activity   Assist    Assist level: Minimal Assistance - Patient > 75% Assistive device: No Device    Walk 10 feet on uneven surface  activity   Assist Walk 10 feet on uneven surfaces activity did not occur: Safety/medical concerns         Wheelchair     Assist Is the patient using a wheelchair?: No Type of Wheelchair:  Manual Wheelchair activity did not occur: Refused         Wheelchair 50 feet with 2 turns activity    Assist    Wheelchair 50 feet with 2 turns activity did not occur: Refused       Wheelchair 150 feet activity     Assist  Wheelchair 150 feet activity did not occur: Refused       Blood pressure 110/65, pulse 65, temperature 98 F (36.7 C), resp. rate 16, height 5\' 7"  (1.702 m), weight 75 kg, SpO2 100%.  Medical Problem List and Plan: 1. 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  Antithrombotics: -DVT/anticoagulation:  Pharmaceutical: Lovenox 40mg  daily -antiplatelet therapy: Plavix 75 mg daily.  Patient with allergy to NSAIDs thus was not placed on aspirin 3. Pain Management: Lido Derm patch 4. Mood/Behavior/Sleep: Prozac 40 mg daily, melatonin 5 mg nightly, Ativan 1 mg every 6 hours as needed anxiety, trazodone 100mg  nightly             -antipsychotic agents: N/A 5. Neuropsych/cognition: This patient is capable of making decisions on her own behalf. 6. Skin/Wound Care: Routine skin check 7. Fluids/Electrolytes/Nutrition: Continue sodium bicarb 650mg  TID. Routine in and outs with follow-up chemistries  Pt states she has always had low CO2 levels , did not take bicarb at home, now has low K+ no diuretics, suspect due to bicarb supplementation - d/c bicarb , give 1x dose of KCL and monitor     Latest Ref Rng & Units 01/16/2024    5:27 AM 01/14/2024    5:34 AM 01/13/2024    6:55 AM  BMP  Glucose 70 - 99 mg/dL 92   161   BUN 6 - 20 mg/dL 6   10   Creatinine 0.96 - 1.00 mg/dL 0.45   4.09   Sodium 811 - 145 mmol/L 142   140   Potassium 3.5 - 5.1 mmol/L 3.4  3.7  3.2   Chloride 98 - 111 mmol/L 109   106   CO2 22 - 32 mmol/L 22   24   Calcium 8.9 - 10.3 mg/dL 9.1   9.2    Recheck in am  8.   Current History of hemiplegic migraines.  Followed by neurology service  Dr. Lucia Gaskins.  Continue Topamax 50mg  daily +50mg  daily PRN- per pt, took off Emgality (which is due ~3/28) as well as Nurtec-  May use prn fioricet 9.  Hyperlipidemia.  Lipitor 80mg  daily 10. 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 Bowel management: continue fibercon and acidophilus, imodium 1mg  q6h PRN, miralax daily PRN Improved   LOS: 5 days A FACE TO FACE EVALUATION WAS PERFORMED  Faith Jordan 01/17/2024, 8:34 AM

## 2024-01-17 NOTE — Progress Notes (Signed)
 Physical Therapy Session Note  Patient Details  Name: Faith Jordan MRN: [id number] Date of Birth: 10-21-1995  Today's Date: 01/17/2024 PT Individual Time: 1031-1105 PT Individual Time Calculation (min): 34 min   Short Term Goals: Week 1:  PT Short Term Goal 1 (Week 1): Pt will perform standing transfers with decreased impulsivity and overall CGA. PT Short Term Goal 2 (Week 1): Pt will ambulate community distances with overall CGA/ supervision and no LLE hyperextension. PT Short Term Goal 3 (Week 1): Pt will perform stairs with 1HR and overall supervision. PT Short Term Goal 4 (Week 1): Pt will improve Berg Balance score by at least 5 points.  Skilled Therapeutic Interventions/Progress Updates:  Patient supine in bed on entrance to room. Patient alert and agreeable to PT session.   Patient with no pain complaint at start of session.  Therapeutic Activity: Bed Mobility: Pt performed supine > sit with supervision/mod I. No cueing provided for technique. Transfers: Pt performed sit<>stand and stand pivot transfers throughout session with focus to maintaining knee apart and midline orientation. Improved ability to attain balance upon stance. Provided vc/ tc for minimal technique.  Gait Training:  Pt ambulated >250' x2 using no AD with close supervision/ CGA for missteps. Demonstrated continued L knee instability with uncontrolled hyperextension exhibited intermittently. Pt with increased focus to prevent. Also focused on increased hip/ knee flexion for improved foot clearance and attempt to nomalize gait pattern. No circumduction noted. VC throughout for maintaining good eccentric control of quad for decreased knee movement during stance phase.   Neuromuscular Re-ed: NMR facilitated during session with focus on standing balance and motor control. Pt guided in use of 4" then 6" step for more dynamic use of LLE to step up, bring RLE to step, then slowly return RLE to ground for eccentric  strengthening of L quad. Difficulty noted initially and improvement throughout with improved knee stability and reaching knee extension with limted hyperextension noted. Performed 2x20 to 4" step then 2x25 to 6" step. Guided also in holding midline for sit<>stands and instructed to pause and hold position during descent to sit. Good holds noted with slight lean to L with fatigue. NMR performed for improvements in motor control and coordination, balance, sequencing, judgement, and self confidence/ efficacy in performing all aspects of mobility at highest level of independence.   Patient supine in bed at end of session with brakes locked, bed alarm set, and all needs within reach. Requested ability to ambulate room/ to bathroom with no assist. Reiterated that balance not quite perfect and need for supervision necessary - no physical assist, just distant supervision.   Therapy Documentation Precautions:  Precautions Precautions: Fall Precaution/Restrictions Comments: L hemipareisis Restrictions Weight Bearing Restrictions Per Provider Order: No  Pain:  No pain related this session as LUE sling donned.   Therapy/Group: Individual Therapy  Loel Dubonnet PT, DPT, CSRS 01/16/2024, 5:26 PM

## 2024-01-17 NOTE — Plan of Care (Signed)

## 2024-01-17 NOTE — Telephone Encounter (Signed)
 Received [employer] FMLA form.  I wanted to confirm that we are holding form until patients follow up in the office, correct?

## 2024-01-17 NOTE — Progress Notes (Signed)
 Occupational Therapy Session Note  Patient Details  Name: Faith Jordan MRN: [id number] Date of Birth: 17-May-1996  Today's Date: 01/17/2024 OT Individual Time: 4098-1191 OT Individual Time Calculation (min): 58 min  OT Individual Time: 1300-1400 OT Individual Time Calculation (min): 60 min   Short Term Goals: Week 1:  OT Short Term Goal 1 (Week 1): Pt will utilize L UE as a gross assist during BADLs with min A OT Short Term Goal 2 (Week 1): Pt will complete toilet transfers CGA usign LRAD OT Short Term Goal 3 (Week 1): Pt will complete grooming/hygine tasks with set-up A using hemi-techniques PRN OT Short Term Goal 4 (Week 1): Pt will complete LB dressing using LRAD and hemi techniques PRN with CGA  Skilled Therapeutic Interventions/Progress Updates:     AM Session:  Pt received sitting up in bed dressed and ready for the day upon OT arrival. Pt presenting to be in good spirits receptive to skilled OT session reporting 0/10 pain- OT offering intermittent rest breaks, repositioning, and therapeutic support to optimize participation in therapy session. Focused this session on L UE NMRE to increase functional use for BADLs and BADL retraining.   Pt requesting to use restroom and brush teeth at beginning of session. Pt transitioned to EOB using bed features with supervision. Pt completed ambulatory transfer to bathroom no AD and completed 3/3 toileting tasks CGA no AD following continent void. Pt ambulated to sink and completed oral hygiene tasks in standing position for increased balance challenge. Pt able to utilize L UE as a stabilizer during task with min A.   Engaged Pt in completing functional mobility to therapy gym no AD with CGA provided for balance +increased time d/t slowed gait speed.   Sitting EOM, engaged Pt in completing lateral leans onto L UE with Pt instructed to lean onto forearm while using R UE to reach across midline to retrieve bean bags positioned on Pt's L side with an  emphasis on WB'ing into L UE and using L UE to push into mat to return to midline. Pt then reversed task to place bean bags back onto L side of mat by lowering onto forearm. Pt required min A overall for stabilization of L UE, to increase support at shoulder, tactile cues for muscle activation, and verbal cues for technique.   Donned e-stim onto Pt's L dorsal forearm and triceps to activate wrist/digit extension and facilitate elbow extension with noted activation. Pt then completed functional reaching task grasping wash cloth and bringing it to her face during "off" phase and releasing wash cloth onto table top during "on" phase- min A required overall at elbow and wrist. Pt then instructed to grasp wash clothes and lift wash cloth beside face to facilitate increased external shoulder rotation during "off" phase and then throw wash cloth during "on" phase to release wash cloth for increased challenge of grading force and to facilitate gravity assisted digit extension with Pt able to complete ~10 reps with min A overall. Doffed e-stim at end of session with mild redness noted on dorsal forearm, no pain or itchiness reported. Donned SAEBO e-stim onto Pt's L deltoid to increase muscle activation to decrease anterior subluxation on level 4 setting.   Pt ambulated back to room no AD with CGA. Pt was left resting in recliner with call bell in reach and all needs met.     PM Session:  Pt received lightly sleeping in bed waking upon OT arrival. Pt presenting to be in good spirits  receptive to skilled OT session reporting 0/10 pain- OT offering intermittent rest breaks, repositioning, and therapeutic support to optimize participation in therapy session. Focused this session on family education, L UE NMRE, and HEP education. Pt also completed FAST-UL during session, see below documentation. Doffed SAEBO at beginning of session with no redness or irritation noted.    Pt's mother present during session and inquiring  about Pt's progress and how she can best support Pt in reaching her goals. Provided education on CVA etiology/recovery process, sleep positions to avoid injury to Pt's L hemibody/avoid pain/optimize recovery with handout provided, purpose of wresting hand splint and UE support sling including how to don, L UE positioning for edema management, and techniques for how to guide Pt through completing gentle AAROM and PROM to maintain ROM and increase muscle activation. Pt's mother receptive to all education provided and motivated to support Pt.   Sitting EOB, engaged Pt in completing series of AAROM exercises with focus on moving through functional movement patterns, decreasing compensatory movements, postural alignment, and utilizing breath as a technique to improve movement patterns. Provided gentle, prolonged stretch to elbow, wrist, and digits in preparation d/t slight increase in muscle tone noted at end ROM. Pt then completed 2x10 reps grasp/release, supination/pronation, elbow flexion/extension, wrist flexion/extension, scapular protraction/retraction, and shoulder flexion (completed in gravity eliminated plane during inferior reaching with slow progression to ~45 degree). Pt with noted initiation of digit extension and wrist extension this session moving through ~50% ROM in gravity eliminated plane!  Issued Pt self-ROM UE HEP to allow for Pt to complete gentle self AAROM exercises and stretches between session to optimize therapeutic outcomes and support improved self-efficacy. OT demonstrated each exercises and issued a handout with pictures and written instructions to support learning.   Pt was left resting in bed with call bell in reach, mother present in room, and all needs met.    FAST-UL Outcome Measure  Hand-to-mouth (HtM) Movement Starting Position: Participant seated on a standard chair without armrests. Trunk leaning on back support of chair. Both hands placed in pronated position on the  ipsilateral middle thigh. Feet placed flat on the floor. If participants have any difficulty in understanding instructions (i.e. aphasia) a visual demonstration is suggested. For each of the 5 tasks of the FAST-UL, the subject at first performs the movement with the less affected UL and then with the affected one. The movement can be repeated 3 times and the best score of the three attempts is assigned.   Instructions: Each subject is asked to move the hand towards the mouth, touch it with fingertips and return to the thigh. Motor task occurs without moving the trunk off the back support and without moving the head toward the hand.   Scoring: Clinical score from 0 to 3 is provided by comparing affected side with less affected one as follows: 0 = no movement at all. 1 = The movement task is not completed (less of 50% of the contralateral HtM movement). 2 = The movement task is not completed (more of 50% of the contralateral HtM movement but the mouth is not reached) or the movement task is completed with compensations. If the mouth is touched with the wrist or the palm or the movement is performed with head or trunk compensations (flexion of the head and trunk towards the hand) the score is 2.   3 = movement carried out at 100% of the contralateral HtM movement. HtM occurs with adequate shoulder flexion and abduction, elbow flexion,  and forearm supination. The mouth is touched with fingertips.  Patient Score: 2   Reach to Target (RtT) Movement Starting Position: Same starting conditions of HtM movement. Instructions: Each subject is asked to move the hand toward a target (i.e. the hand of the examiner) located in front of the subject in the ipsilateral workspace at shoulder height, at a distance corresponding to 100% of the fully extended UL within arm's reach (less affected arm as reference). Participants have to reach, touch the target, and return. Motor task occurs without moving the trunk off  the back support. Scoring: Clinical score from 0 to 3 is provided by comparing affected side with less affected one as follows: 0 = no movement at all. 1 = The movement task is not completed (less of 50% of the contralateral RtT movement).  2 = The movement task is not completed (more of 50% of the contralateral RtT movement but the target is not reached) or the movement task is completed with compensations (i.e. the trunk loses contact with the back support of the chair with forward displacement, shoulder flexion occurs with excessive scapular elevation, or shoulder excessive abduction). If the target is reached with trunk or shoulder compensations for inadequate elbow and finger extension the score is 2.  3 = movement performed at 100% of the contralateral RtT. The target is reached with adequate shoulder flexion, elbow, wrist and finger extension.  Patient Score: 1   Prono-supination (PS) Movement Starting Position: Same starting conditions of HtM movement. Instructions: Motor task occurs without moving the trunk anteriorly or laterally, the medial side of the humerus is against the body, the forearm is fully pronated with the hand resting on the thigh. Scoring: Clinical score from 0 to 3 is provided by comparing paretic side with less affected one as follows: 0 = no movement at all. 1 = The movement task is not completed (less of 50% of the contralateral PS movement).  2 = The movement task is not completed (more of 50% of the contralateral PS movement but the forearm is not fully supinated) or the movement task is completed with compensations (i.e. excessive trunk inclination, shoulder abduction). If the movement is completed with compensations at elbow, shoulder or trunk level the score is 2. 3 = movement performed at 100% of the contralateral PS (complete supination of the forearm with the dorsal part of the hand in contact with the thigh).   Patient Score: 2   Grasp and Release (GaR)  Movement Starting position: Participant seated on a standard chair. Hip and knees in 90 flexion, feet flat on the floor. Upper limb (UL) resting on a table in front of the participant with approximately 90 elbow flexion, forearm pronated and fingers in a relaxed extended and adducted position.  Instructions: The subject performs a grasping movement of a cylindrical rigid glass (at least 6 cm diameter) placed proximally to an imaginary line connecting the distal joints of thumb and index finger. The subject is asked to grasp the glass, lift it at least 2 cm (elbow remains in contact with the table), and release it. Scoring:  Clinical score from 0 to 3 is provided by comparing affected side with less affected one as follows: 0 = No movement. The grasp is not possible. 1 = The movement task is not completed (less of 50% of the task). Some prehension is possible but the grasp is not sufficiently stable to lift the object; the grasp can be performed with the use of the less  affected hand only to stabilize the glass for inadequate hand/finger opening and the release is not possible. Some hand opening is required otherwise the score is 0. 2 = The movement task is not completed (more of 50% of the task). The object is grasped and lifted but it falls or the task is completed using alternative grasping strategies (i.e. multi-pulpar, palmar, digito palmar; grasping and releasing of the object is possible with abnormal orientation of the wrist and fingers toward the object and the forearm is lifted off the table). 3 = The task is completed using the expected pattern (normal orientation of fingers or wrist toward the object, the grasp occurs with thumb and fingers in opposition, forearm supination, elbow flexion; thumb abduction and finger extension to release the object).  Patient Score: 1   Pinch and Release (PaR) Movement Starting position: Same starting conditions of GaR movement The participant performs a  PaR movement of a pen placed on a table in the midline of an imaginary line connecting the distal joints of thumb and index finger. The participants asked to pinch the pen with the tips of thumb and index finger, lift it at least 2 cm (elbow remains in contact with the table), and release it. Clinical score from 0 to 3 is provided by comparing affected side with non-affected one as follows: 0 = No movement. The pinch is not possible. 1 = The movement task is not completed (less of 50% of the task). Some prehension is possible but the pinch is not sufficiently stable to lift the object; the pinch occurs with the use of the less affected hand to stabilize the object for inadequate finger opening and the release is not possible. Some fingers movement is required otherwise the score is 0. 2 = The movement task is not completed (more of 50% of the task). The object is pinched and lifted but it falls or the task is completed using alternative pinching strategies (e.g. pinching with all the fingers, tripod pinch, pinching and releasing of the object is possible with abnormal orientation of fingers and wrist toward the object and the forearm is lifted off the table). 3 = The task is completed using the expected pattern (normal orientation of fingers or wrist toward the object, the pinch occurs with opposition of pads of index finger and thumb, and wrist extension).  Patient Score: 1  Total score: 7/15  Therapy Documentation Precautions:  Precautions Precautions: Fall Precaution/Restrictions Comments: L hemipareisis Restrictions Weight Bearing Restrictions Per Provider Order: No   Therapy/Group: Individual Therapy  Clide Deutscher 01/17/2024, 7:51 AM

## 2024-01-17 NOTE — Progress Notes (Signed)
 Physical Therapy Session Note  Patient Details  Name: Faith Jordan MRN: [id number] Date of Birth: Nov 13, 1995  Today's Date: 01/17/2024 PT Individual Time: 1324-4010 PT Individual Time Calculation (min): 47 min   Short Term Goals: Week 1:  PT Short Term Goal 1 (Week 1): Pt will perform standing transfers with decreased impulsivity and overall CGA. PT Short Term Goal 2 (Week 1): Pt will ambulate community distances with overall CGA/ supervision and no LLE hyperextension. PT Short Term Goal 3 (Week 1): Pt will perform stairs with 1HR and overall supervision. PT Short Term Goal 4 (Week 1): Pt will improve Berg Balance score by at least 5 points.  Skilled Therapeutic Interventions/Progress Updates:  Patient supine in bed and asleep on entrance to room. Easily roused and allowed time to fully wake. Patient then alert and agreeable to PT session.   Patient with no pain complaint at start of session. Requests to don LUE sling to prevent shoulder/ elbow pain 2/2 mild sublux.  Therapeutic Activity: Bed Mobility: Pt performed supine > sit with supervision/ Mod I. RUE used to remove bed linens. No cueing required for technique. Extra time to complete. Dons shoes with setup while seated EOB.  Transfers: Pt performed sit<>stand and stand pivot transfers throughout session with close supervision. Demos learning with rise to stand and attaining upright balance prior to movement. Good technique with scoot forward to edge of seat prior to initiating movement. Controlled descent to sit.   Gait Training:  Pt ambulated >200' x1 to main gym with no AD and close supervision/ CGA. Demonstrated continued L knee instability with uncontrolled hyperextension exhibited intermittently. Pt with increased focus to prevent. Also focused on increased hip/ knee flexion for improved foot clearance and attempt to nomalize gait pattern. No circumduction performed. Provided vc/ tc for decreasing amount of L knee flexion into more  extension during mid to terminal stance for LLE leading to the intermittent hyperextension 2/2 quad weakness.   Attempt to utilize Swedish knee cage for decreasing position into genu recurvatum. Unable to locate so green tband used for tc and min facilitation to prevent hyperextension during gait. Then ambulated >200 ft to day room with minimal but noted improvement in knee stability.   Neuromuscular Re-ed: NMR facilitated during session with focus on standing balance and motor control. Pt guided in dynamic forward lunge leading with LLE in order to toss beanbag to target placed 15 ft away. Coordinated step/ toss using RUE. Performed 12x3 sets with improving accuracy as well as improving control of LLE into large step forward, good eccentric control of L quad and improving ability to concentrically use quad to perform adequate posterior step to equalize step placement. Brief seated rest breaks between sets.   Performed slow eccentric return to sit with isometric holds during brief pauses in descent to sit. Soreness noted with increased hold in movement.  NMR performed for improvements in motor control and coordination, balance, sequencing, judgement, and self confidence/ efficacy in performing all aspects of mobility at highest level of independence.   Ambulated 190' x1 back to room with noted increase in hyperextension of L knee d/t fatigue from increased work during session. Toilet transfer performed on return to room with pt able to perform transfer, manage clothing, perform pericare all with distant supervision/ Mod I. Washes hands at sink with supervision.   Patient supine in bed at end of session with brakes locked, bed alarm set, and all needs within reach.   Therapy Documentation Precautions:  Precautions Precautions: Fall  Precaution/Restrictions Comments: L hemipareisis Restrictions Weight Bearing Restrictions Per Provider Order: No  Pain: Pt relates pain in shoulder with movement  without sling donned. Addressed with donning of sling for LUE.  Therapy/Group: Individual Therapy  Loel Dubonnet PT, DPT, CSRS 01/16/2024, 4:28 PM

## 2024-01-17 NOTE — Progress Notes (Signed)
 Physical Therapy Session Note  Patient Details  Name: Faith Jordan MRN: [id number] Date of Birth: 11-Dec-1995  Today's Date: 01/17/2024 PT Individual Time: 4098-1191 PT Individual Time Calculation (min): 71 min   Short Term Goals: Week 1:  PT Short Term Goal 1 (Week 1): Pt will perform standing transfers with decreased impulsivity and overall CGA. PT Short Term Goal 2 (Week 1): Pt will ambulate community distances with overall CGA/ supervision and no LLE hyperextension. PT Short Term Goal 3 (Week 1): Pt will perform stairs with 1HR and overall supervision. PT Short Term Goal 4 (Week 1): Pt will improve Berg Balance score by at least 5 points.  Skilled Therapeutic Interventions/Progress Updates:  Patient supine in bed on entrance to room and asleep. Patient alert and agreeable to PT session.   Patient with no pain complaint at start of session.  Therapeutic Activity: Bed Mobility: Pt performed supine <> sit with supervision/ Mod I. No cueing required. Transfers: Pt performed sit<>stand and stand pivot transfers throughout session with good awareness and improving focus on utilizing midline orientation and equal balance over BLE. No cues required.   Gait Training:  Pt ambulated 200 ft using no AD with close supervision. Demonstrated mild improvement in smoothness of movement/ coordination of LLE advancement. Continued intermittent uncontrolled hyperextension. Return trip to room with close supervision and 4# aw donned. Despite weight, pt is able to clear foot and completes more than half distance with aw donned. After removing, pt relates increased ease to control positioning of LE and perform step through with aw donned.  Educated pt that this is d/t stroke affecting more coordination and secondary muscle movers that control smoothing of movement.  Provided vc/ tc for maintaining foot clearance, upright gaze, heel strike.  Neuromuscular Re-ed: NMR facilitated during session with focus on  standing balance, dynamic gait. Pt guided in stepping over 6" then 9" hurdles with 2.5# aw to LLE. Leading with LLE throughout and performed 2 sets of 6 hurdles at each height. Then progressed to ambulation through agility ladder and is able to complete stepping one foot into each square x4, then high knee stepping following education re: post>ant weight shift with each step. Pt is able to perform with intermittent missteps and minor LOB that pt is able to self correct. Improvement noted throughout  NMR performed for improvements in motor control and coordination, balance, sequencing, judgement, and self confidence/ efficacy in performing all aspects of mobility at highest level of independence.   Patient seated on EOB at end of session with brakes locked, no alarm set, and all needs within reach. Friend who is an [employer] fundraiser is present and would like to assist pt with shower. This therapist assisted pt to choose clothing, then supervised into bathroom. Friend taking over care. NT notified as to friend assisting with shower but to stop by and check intermittently to ensure everything is okay.    Therapy Documentation Precautions:  Precautions Precautions: Fall Precaution/Restrictions Comments: L hemipareisis Restrictions Weight Bearing Restrictions Per Provider Order: No  Pain: Pain Assessment Pain Scale: 0-10 Pain Score: 0-No pain related this session.    Therapy/Group: Individual Therapy  Loel Dubonnet PT, DPT, CSRS 01/17/2024, 11:17 AM

## 2024-01-17 NOTE — Progress Notes (Signed)
 Speech Language Pathology Daily Session Note  Patient Details  Name: Faith Jordan MRN: [id number] Date of Birth: 05-Mar-1996  Today's Date: 01/17/2024 SLP Individual Time: 0930-1000 SLP Individual Time Calculation (min): 30 min  Short Term Goals: Week 1: SLP Short Term Goal 1 (Week 1): Patient will utilize swallowing compensatory strategies during consumption of regular/thin diet with supervision multimodal A SLP Short Term Goal 2 (Week 1): Patient will participate in lingual strengthening exercises to increase articulatory precision given supervision multimodal A SLP Short Term Goal 3 (Week 1): Patient will demonstrate problem solving in complex functional situations given supervision A SLP Short Term Goal 4 (Week 1): Patient will demonstrate sustained attention to tasks for upwards of 20 minutes given supervision multimodal A  Skilled Therapeutic Interventions: Skilled therapy session focused on communication goals. SLP initiated use of IOPI ([place] Instrument) to target lingual strength in order to increase articulatory precision. Patient with maximum anterior and posterior kpa of 34 this date, an improvement since last session. Patient reports practicing outside of therapy, which is evident by today's testing. SLP prompted patient to complete x40 repetitions anteriorly and posteriorly set at 30kpa. SLP continued to target communication goals through prompting patient to verbalize repetitive /w/  phrases. Patient with 100% intelligibility with modified independent use of compensatory strategies. Patient left in bed with alarm set and call bell in reach. Continue POC.  Pain None reported   Therapy/Group: Individual Therapy  Deyvi Bonanno M.A., CCC-SLP 01/17/2024, 7:45 AM

## 2024-01-18 DIAGNOSIS — I6381 Other cerebral infarction due to occlusion or stenosis of small artery: Secondary | ICD-10-CM | POA: Diagnosis not present

## 2024-01-18 DIAGNOSIS — F54 Psychological and behavioral factors associated with disorders or diseases classified elsewhere: Secondary | ICD-10-CM | POA: Diagnosis not present

## 2024-01-18 DIAGNOSIS — G43709 Chronic migraine without aura, not intractable, without status migrainosus: Secondary | ICD-10-CM | POA: Diagnosis not present

## 2024-01-18 DIAGNOSIS — T384X5S Adverse effect of oral contraceptives, sequela: Secondary | ICD-10-CM | POA: Diagnosis not present

## 2024-01-18 LAB — BASIC METABOLIC PANEL WITH GFR
Anion gap: 10 (ref 5–15)
BUN: 8 mg/dL (ref 6–20)
CO2: 21 mmol/L — ABNORMAL LOW (ref 22–32)
Calcium: 8.9 mg/dL (ref 8.9–10.3)
Chloride: 109 mmol/L (ref 98–111)
Creatinine, Ser: 0.75 mg/dL (ref 0.44–1.00)
GFR, Estimated: >60 mL/min (ref >60)
Glucose, Bld: 88 mg/dL (ref 70–99)
Potassium: 3.4 mmol/L — ABNORMAL LOW (ref 3.5–5.1)
Sodium: 140 mmol/L (ref 135–145)

## 2024-01-18 NOTE — Progress Notes (Signed)
 Physical Therapy Session Note  Patient Details  Name: Faith Jordan MRN: [id number] Date of Birth: 02/18/1996  Today's Date: 01/18/2024 PT Individual Time: 1531-1610 PT Individual Time Calculation (min): 39 min   Short Term Goals: Week 1:  PT Short Term Goal 1 (Week 1): Pt will perform standing transfers with decreased impulsivity and overall CGA. PT Short Term Goal 2 (Week 1): Pt will ambulate community distances with overall CGA/ supervision and no LLE hyperextension. PT Short Term Goal 3 (Week 1): Pt will perform stairs with 1HR and overall supervision. PT Short Term Goal 4 (Week 1): Pt will improve Berg Balance score by at least 5 points.  Skilled Therapeutic Interventions/Progress Updates:  Patient seated on EOB on entrance to room. Patient alert and agreeable to PT session.   Patient with no pain complaint at start of session.  Therapeutic Activity: Transfers: Toilet transfer performed initially with distant supervision. Able to manage pericare and clothing mgmt with IND using RUE. Pt performed sit<>stand and stand pivot transfers throughout session with supervision and continuing to improve control and positioning in midline. No cueing provided for technique or safety.  Gait Training:  Pt ambulated 220' x2 using no AD with close supervision. Demonstrated improved coordination in smooth LLE advancement. Slowly improving from more robotic movements since initial eval. Intemittent uncontrolled knee hyperextension into genu recurvatum noted. Also improving in frequency.  On return trip, pt able to tolerate and withstand surprise large perturbations with no LOB. Improving reactive balance with use of stepping strategy.  Provided vc initially only for focus to knee stability.  Neuromuscular Re-ed: NMR facilitated during session with focus on standing balance, motor control, proprioception. Pt guided initially in reciprocal side lunges into minisquats. Able to improve from needing CGA to  supervision and also improves throughout in dynamic push from squat back into ready position with feet at normal BOS.   Progressed pt to positioning on mat table in quadruped. Guided in contralateral shoulder tapping then arm raise moving reciprocally. Block to guard provided to LUE at elbow. No pain in LUE with positioning. Progressed to reciprocal, then called LE extension. Decreased control demonstrated initially but then improves throughout performance. Noted imbalance/ decreased motor control with WB and hold over LLE when lifting RLE. Required block to hips for initial performance but then improves to guard with blocked practice. Improved coordination in lift of LLE with blocked practice. Noted LUE elbow extension when progressed to birddogs with contralateral UE/LE raise. Pt relates feeling push into elbow extension.   NMR performed for improvements in motor control and coordination, balance, sequencing, judgement, and self confidence/ efficacy in performing all aspects of mobility at highest level of independence.   Patient seated on EOB at end of session with brakes locked, bed alarm set, and all needs within reach.  Mom with questions re: LOS with parents prior to returning home independently, length of participation with OPPT, logistics of scheduling PT in order to coordinate with her own PT with [facility]. Answered questions to best of knowledge but ultimately stay in home with parents will depend on ability to use LUE with more functionality and if parents feel she is mobilizing safely. They will be able to tell if they feel like they need to assist her in any way and will note her progress. Logistics of scheduling will depend on open time slots for all locations but therapy clinics will usually try to schedule PT and OT sessions back-to-back for improved flow with pt.   Therapy Documentation  Precautions:  Precautions Precautions: Fall Precaution/Restrictions Comments: L  hemipareisis Restrictions Weight Bearing Restrictions Per Provider Order: No  Pain:  No pain related this session. No arm sling donned but pt holding L elbow for tension relief from gravity.   Therapy/Group: Individual Therapy  Loel Dubonnet PT, DPT, CSRS 01/18/2024, 12:56 PM

## 2024-01-18 NOTE — Progress Notes (Signed)
 Speech Language Pathology Daily Session Note  Patient Details  Name: Faith Jordan MRN: [id number] Date of Birth: 07-20-1996  Today's Date: 01/18/2024 SLP Individual Time: 2952-8413 SLP Individual Time Calculation (min): 58 min  Short Term Goals: Week 1: SLP Short Term Goal 1 (Week 1): Patient will utilize swallowing compensatory strategies during consumption of regular/thin diet with supervision multimodal A SLP Short Term Goal 2 (Week 1): Patient will participate in lingual strengthening exercises to increase articulatory precision given supervision multimodal A SLP Short Term Goal 3 (Week 1): Patient will demonstrate problem solving in complex functional situations given supervision A SLP Short Term Goal 4 (Week 1): Patient will demonstrate sustained attention to tasks for upwards of 20 minutes given supervision multimodal A  Skilled Therapeutic Interventions: Skilled therapy session focused on cognitive and communication goals. SLP facilitated session by prompting patient to complete hospital navigation task. Patient independently navigated to various hospital locations and participated in budgeting activity in the gift shop. Patient was instructed to locate items in giftshop to reach $50 and $100 budget. Once returned to room, patient independently recalled items "purchased" at the gift shop and locations visited. SLP then utilized IOPI to assess lingual strength. Patient with a maximum kpa of 40 anteriorly and 43 posteriorly, an improvement from prior. Patient then independently completed x20 repetitions of 35kpa on anterior and posterior lingual surface. Patient was 100% intelligible at the conversational level. Patient left in bed with alarm set and call bell in reach. Continue POC.    Pain None reported   Therapy/Group: Individual Therapy  Aissa Lisowski M.A., CCC-SLP 01/18/2024, 7:49 AM

## 2024-01-18 NOTE — Telephone Encounter (Signed)
 FYI

## 2024-01-18 NOTE — Progress Notes (Signed)
 Occupational Therapy Session Note  Patient Details  Name: Faith Jordan MRN: [id number] Date of Birth: 1996-03-24  Today's Date: 01/18/2024 OT Individual Time: 4098-1191 OT Individual Time Calculation (min): 62 min    Short Term Goals: Week 1:  OT Short Term Goal 1 (Week 1): Pt will utilize L UE as a gross assist during BADLs with min A OT Short Term Goal 2 (Week 1): Pt will complete toilet transfers CGA usign LRAD OT Short Term Goal 3 (Week 1): Pt will complete grooming/hygine tasks with set-up A using hemi-techniques PRN OT Short Term Goal 4 (Week 1): Pt will complete LB dressing using LRAD and hemi techniques PRN with CGA  Skilled Therapeutic Interventions/Progress Updates:     Pt received sitting up in bed presenting to be in good spirits receptive to skilled OT session reporting 0/10 pain- OT offering intermittent rest breaks, repositioning, and therapeutic support to optimize participation in therapy session. Focused this session on L UE NMRE, dynamic balance, and BADL retraining. Provided update to Pt on set d/c date of 04/12 with Pt receptive to plan and motivated to reach a mod I level for BADLs. Also discussed plans for Pt to participate in community outing during next week with Pt expressing feeling nervous about returning to the community, however receptive to plan. Provided therapeutic support and education on purpose of outing to support increasing confidence in returning to community, increase safety, and to discuss techniques on how to optimize independence with Pt receptive to education.   Sitting EOB, engaged Pt in donning deodorant with Pt able to utilize L UE to stabilize and grasp deodorant to remove lid and bring deodorant to R underarm while maintaining grasp on it with her L hand with light min A! Pt more motivated to utilize L UE during functional tasks this session. Pt requesting to use restroom. Pt completed ambulatory transfer to bathroom and completed 3/3 toileting  tasks with CGA +increased time and min verbal cues for thumbing hooking technique when brining pants over L hip.   Pt completed functional mobility to therapy gym no AD with CGA provided for balance.   Engaged Pt in dynamic standing balance L UE NMRE task at elevated table. With L hand positioned on washcloth, engaged Pt in completing scapular protraction/retraction, scapular circumduction clockwise/counter clockwise, isolated elbow flexion/extension, and shoulder abduction/adduction with WB'ing facilitated into L UE during task. Consistent verbal and tactile cues provided to avoid compensatory trunk flexion or shoulder elevation.   Sitting EOM, donned e-stim onto Pt's dorsal forearm and triceps to facilitate wrist/digit extension and elbow extension. During "on" phase of e-stim, worked on functional reaching tasks to retrieve cones from table top, stacking cones, and inferiorly reaching to retrieve cone and during "off" phase whored on grasping, transporting, and brining cones to chin to simulate skills required for BADLs. Pt able to complete activities with mod verbal and tactile cues to decrease shoulder elevation, for postural alignment, and to decrease compensatory trunk flexion with intermittent rest breaks. Doffed e-stim at end of session with no redness or irritation noted. Pt with improved wrist and digit flexion this session 3-/5 when giving maximal effort.    Donned SAEBO e-stim unit onto Pt's L deltoid at end of session and allowed it to run for 60 min unattended to increase L shoulder activation. Informed Pt to doff during upcoming session and complete skin assessment.   Pt ambulated back to her room CGA no AD. Pt was left resting EOB with call bell in reach, bed  alarm on, and all needs met.    Therapy Documentation Precautions:  Precautions Precautions: Fall Precaution/Restrictions Comments: L hemipareisis Restrictions Weight Bearing Restrictions Per Provider Order:  No   Therapy/Group: Individual Therapy  Clide Deutscher 01/18/2024, 7:54 AM

## 2024-01-18 NOTE — Progress Notes (Signed)
 PROGRESS NOTE   Subjective/Complaints:  Reviewed labs K+ still borderline low , discussed KCL supplementation  Worked on E stim with OT to promote Left finger extension   ROS- see HPI. neg CP, SOB, abd pain, N/V/D  Objective:   No results found. Recent Labs    01/16/24 0527  WBC 8.1  HGB 13.7  HCT 40.3  PLT 302   Recent Labs    01/16/24 0527 01/18/24 0525  NA 142 140  K 3.4* 3.4*  CL 109 109  CO2 22 21*  GLUCOSE 92 88  BUN 6 8  CREATININE 0.96 0.75  CALCIUM 9.1 8.9    Intake/Output Summary (Last 24 hours) at 01/18/2024 0843 Last data filed at 01/17/2024 0900 Gross per 24 hour  Intake 240 ml  Output --  Net 240 ml        Physical Exam: Vital Signs Blood pressure 107/67, pulse 69, temperature 97.9 F (36.6 C), temperature source Oral, resp. rate 16, height 5\' 7"  (1.702 m), weight 75 kg, SpO2 98%.   General: No acute distress, resting in bed comfortably.  Mood and affect are appropriate Heart: Regular rate and rhythm no rubs murmurs or extra sounds Lungs: Clear to auscultation, breathing unlabored, no rales or wheezes Abdomen: Positive bowel sounds, soft nontender to palpation, nondistended Extremities: No clubbing, cyanosis, or edema Skin: No evidence of breakdown, no evidence of rash over exposed surfaces.  [name]: L arm weakness, L leg weakness Neuro: A&O x3  PRIOR EXAMS: Neurologic: Cranial nerves II through XII intact, motor strength is 5/5 in right and 3-/5 left deltoid, bicep, tricep, grip, 5/5 RIght and 4- left hip flexor, knee extensors, 5/5 right and 3- left ankle dorsiflexor and plantar flexor  Sensory exam normal sensation to light touch  in bilateral upper and lower extremities Cerebellar exam limited by weakness LUE  Musculoskeletal: Full passive range of motion in all 4 extremities. No joint swelling 1FB subluxation ,    Assessment/Plan: 1. Functional deficits which require 3+ hours  per day of interdisciplinary therapy in a comprehensive inpatient rehab setting. Physiatrist is providing close team supervision and 24 hour management of active medical problems listed below. Physiatrist and rehab team continue to assess barriers to discharge/monitor patient progress toward functional and medical goals  Care Tool:  Bathing    Body parts bathed by patient: Left arm, Chest, Abdomen, Front perineal area, Buttocks, Right upper leg, Left upper leg, Face, Right lower leg, Left lower leg   Body parts bathed by helper: Right arm, Right lower leg, Left lower leg     Bathing assist Assist Level: Minimal Assistance - Patient > 75%     Upper Body Dressing/Undressing Upper body dressing   What is the patient wearing?: Pull over shirt    Upper body assist Assist Level: Minimal Assistance - Patient > 75%    Lower Body Dressing/Undressing Lower body dressing      What is the patient wearing?: Pants     Lower body assist Assist for lower body dressing: Minimal Assistance - Patient > 75%     Toileting Toileting    Toileting assist Assist for toileting: Minimal Assistance - Patient > 75%  Transfers Chair/bed transfer  Transfers assist     Chair/bed transfer assist level: Moderate Assistance - Patient 50 - 74%     Locomotion Ambulation   Ambulation assist      Assist level: Minimal Assistance - Patient > 75% Assistive device: No Device Max distance: 250 ft   Walk 10 feet activity   Assist     Assist level: Minimal Assistance - Patient > 75% Assistive device: No Device   Walk 50 feet activity   Assist    Assist level: Minimal Assistance - Patient > 75% Assistive device: No Device    Walk 150 feet activity   Assist    Assist level: Minimal Assistance - Patient > 75% Assistive device: No Device    Walk 10 feet on uneven surface  activity   Assist Walk 10 feet on uneven surfaces activity did not occur: Safety/medical  concerns         Wheelchair     Assist Is the patient using a wheelchair?: No Type of Wheelchair: Manual Wheelchair activity did not occur: Refused         Wheelchair 50 feet with 2 turns activity    Assist    Wheelchair 50 feet with 2 turns activity did not occur: Refused       Wheelchair 150 feet activity     Assist  Wheelchair 150 feet activity did not occur: Refused       Blood pressure 107/67, pulse 69, temperature 97.9 F (36.6 C), temperature source Oral, resp. rate 16, height 5\' 7"  (1.702 m), weight 75 kg, SpO2 98%.  Medical Problem List and Plan: 1. 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  Antithrombotics: -DVT/anticoagulation:  Pharmaceutical: Lovenox 40mg  daily -antiplatelet therapy: Plavix 75 mg daily.  Patient with allergy to NSAIDs thus was not placed on aspirin 3. Pain Management: Lido Derm patch 4. Mood/Behavior/Sleep: Prozac 40 mg daily, melatonin 5 mg nightly, Ativan 1 mg every 6 hours as needed anxiety, trazodone 100mg  nightly             -antipsychotic agents: N/A 5. Neuropsych/cognition: This patient is capable of making decisions on her own behalf. 6. Skin/Wound Care: Routine skin check 7. Fluids/Electrolytes/Nutrition: Continue sodium bicarb 650mg  TID. Routine in and outs with follow-up chemistries  Pt states she has always had low CO2 levels , did not take bicarb at home, now has low K+ no diuretics, suspect due to bicarb supplementation - d/c bicarb , give 1x dose of KCL and monitor     Latest Ref Rng & Units 01/18/2024    5:25 AM 01/16/2024    5:27 AM 01/14/2024    5:34 AM  BMP  Glucose 70 - 99 mg/dL 88  92    BUN 6 - 20 mg/dL 8  6    Creatinine 0.98 - 1.00 mg/dL 1.19  1.47    Sodium 829 - 145 mmol/L 140  142    Potassium 3.5 - 5.1 mmol/L 3.4  3.4   3.7   Chloride 98 - 111 mmol/L 109  109    CO2 22 - 32 mmol/L 21  22    Calcium 8.9 - 10.3 mg/dL 8.9  9.1  Recheck still low will need daily KCL 8.  Current History of hemiplegic migraines.  Followed by neurology service Dr. Lucia Gaskins.  Continue Topamax 50mg  daily +50mg  daily PRN- per pt, took off Emgality (which is due ~3/28) as well as Nurtec-  May use prn fioricet 9.  Hyperlipidemia.  Lipitor 80mg  daily 10. 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 Bowel management: continue fibercon and acidophilus, imodium 1mg  q6h PRN, miralax daily PRN Improved   LOS: 6 days A FACE TO FACE EVALUATION WAS PERFORMED  Erick Colace 01/18/2024, 8:43 AM

## 2024-01-18 NOTE — Progress Notes (Signed)
 Patient ID: Faith Jordan, female   DOB: [birth year], 28 y.o.   MRN: [id number]  Met with pt to give her the STD paperwork and update her regarding team conference goals of supervision/mod/I level and target discharge date of 4/12. She feels she is making progress but just not fast enough or she is at a level she wants to be at. Discussed to have some patience with herself and the main thing is she is making progress almost daily and to look at this. Will stop back by when Mom is here later today.

## 2024-01-18 NOTE — Plan of Care (Signed)
  Problem: Consults Goal: RH STROKE PATIENT EDUCATION Description: See Patient Education module for education specifics  Outcome: Progressing   Problem: RH SAFETY Goal: RH STG ADHERE TO SAFETY PRECAUTIONS W/ASSISTANCE/DEVICE Description: STG Adhere to Safety Precautions With cues Assistance/Device. Outcome: Progressing   Problem: RH PAIN MANAGEMENT Goal: RH STG PAIN MANAGED AT OR BELOW PT'S PAIN GOAL Description: < 4 with prns Outcome: Progressing   Problem: RH KNOWLEDGE DEFICIT Goal: RH STG INCREASE KNOWLEGDE OF HYPERLIPIDEMIA Description: Patient and family will be able to manage HLD using educational resources for medications and dietary modifications independently Outcome: Progressing Goal: RH STG INCREASE KNOWLEDGE OF STROKE PROPHYLAXIS Description: Patient and family will be able to manage secondary stroke risks using educational resources for medications and dietary modifications independently Outcome: Progressing

## 2024-01-18 NOTE — Progress Notes (Signed)
 Physical Therapy Session Note  Patient Details  Name: Faith Jordan MRN: [id number] Date of Birth: [birth year]  Today's Date: 01/18/2024 PT Individual Time: 0917-1002 PT Individual Time Calculation (min): 45 min   Short Term Goals: Week 1:  PT Short Term Goal 1 (Week 1): Pt will perform standing transfers with decreased impulsivity and overall CGA. PT Short Term Goal 2 (Week 1): Pt will ambulate community distances with overall CGA/ supervision and no LLE hyperextension. PT Short Term Goal 3 (Week 1): Pt will perform stairs with 1HR and overall supervision. PT Short Term Goal 4 (Week 1): Pt will improve Berg Balance score by at least 5 points.  Skilled Therapeutic Interventions/Progress Updates:  Patient supine in bed on entrance to room. Patient alert and agreeable to PT session.  Patient with no pain complaint at start of session.  Therapeutic Activity: Transfers: Pt performed sit<>stand transfers with Mod I requiring extra focus  and stand pivot transfers throughout session with sup for balance.   Gait Training/ NMR:  Pt ambulated 250' x2 using no AD with close supervision. Demonstrated improved coordination in smooth LLE advancement. Intemittent uncontrolled knee hyperextension into genu recurvatum noted.  Pt guided in ambulation through agility ladder: High knee stepping into each square x2 Alternating toe tap with progression of step into next square out and back x4 Alternating step with progression into next square after controlled circling of swing LE around cone x4.   Performed with 4# aw and flexion/ IR wrap to knee. Then removed aw halfway in performance of toe taps. Prior to circling cones, removed wrap to knee. Improves in quality of balance and gait throughout with fewer # of LOB. Continued difficulty with maintaining L knee soft extension requiring vc intermittently.   NMR performed for improvements in motor control and coordination, balance, sequencing, judgement, and  self confidence/ efficacy in performing all aspects of mobility at highest level of independence.   Patient supine in bed at end of session with brakes locked, bed alarm set, and all needs within reach.   Therapy Documentation Precautions:  Precautions Precautions: Fall Precaution/Restrictions Comments: L hemipareisis Restrictions Weight Bearing Restrictions Per Provider Order: No  Pain:  No pain related this session.  Therapy/Group: Individual Therapy  Loel Dubonnet PT, DPT, CSRS 01/18/2024, 12:51 PM

## 2024-01-19 DIAGNOSIS — T384X5S Adverse effect of oral contraceptives, sequela: Secondary | ICD-10-CM | POA: Diagnosis not present

## 2024-01-19 DIAGNOSIS — G43709 Chronic migraine without aura, not intractable, without status migrainosus: Secondary | ICD-10-CM | POA: Diagnosis not present

## 2024-01-19 DIAGNOSIS — F54 Psychological and behavioral factors associated with disorders or diseases classified elsewhere: Secondary | ICD-10-CM

## 2024-01-19 DIAGNOSIS — I6381 Other cerebral infarction due to occlusion or stenosis of small artery: Secondary | ICD-10-CM | POA: Diagnosis not present

## 2024-01-19 MED ORDER — DOCUSATE SODIUM 100 MG PO CAPS
100.0000 mg | ORAL_CAPSULE | Freq: Every day | ORAL | Status: DC
  Administered 2024-01-19 – 2024-01-24 (×6): 100 mg via ORAL
  Filled 2024-01-19 (×10): qty 1

## 2024-01-19 NOTE — Consult Note (Signed)
 Neuropsychological Consultation Comprehensive Inpatient Rehab   Patient:   Faith Jordan   DOB:   [birth year]  MR Number:  [id number]  Location:  [facility] [address] CENTER A [address] [place] [address] Dept: [phone] Loc: [phone]           Date of Service:   01/18/2024  Start Time:   1 PM End Time:   2 PM  Provider/Observer:  Arley Phenix, Psy.D.       Clinical Neuropsychologist       Billing Code/Service: [phone]  Reason for Service:    Faith Jordan is a 28 year old female referred for neuropsychological consultation during the patient's ongoing admission to the comprehensive inpatient rehabilitation unit.  Patient had a recent right hemisphere stroke.  Patient has a history of familiar hemiplegic migraine and has been followed by neurology with Dr. Lucia Gaskins.  Full medical history can be found below for convenience and the patient's H&P.  Patient had presented on 01/07/2024 with acute onset left-sided weakness and facial droop.  No headache noted.  CT/CTA were unremarkable.  MRI showed a 10 mm acute nonhemorrhagic infarct in the posterior limb of the right internal capsule as well as finding of bilateral basal ganglia calcification raising possibility of mineralizing lenticulostraiate vasculopathy.  Patient actually had these calcifications noted in the basal ganglia bilaterally and an incidental finding back when she was roughly 28 years old.  Patient was stabilized medically.  Patient did not receive tPA.  Therapy evaluations were conducted and the patient was admitted to the comprehensive inpatient unit due to decreased functional mobility with left-sided weakness.  In-depth medical review was made as to potential causative factors for her stroke.  Patient has a long history of severe hemiplegic migraine and has had previous aura phase of migraines with TIA like events.  Concern for possible Fahr syndrome was reviewed but  length of time since initial finding of these calcifications is roughly 20 years and unlikely related to this type of a condition.  During today's clinical interview the patient was oriented x 4 with good cognitive functioning.  Patient continues with significant left-sided motor deficits primarily focused on left arm and hand.  Patient is ambulatory now but has weakness in the left leg but is able to stand and walk with assistance.  Patient is able to move her hand but has lost significant strength and fine motor control.  Patient is having difficulty coping with these issues and concerns for her capacity to return to her general life functioning persist.  Patient denies depression or anxiety to the point that she is not able to participate in therapies and her emotional response appears appropriate to the circumstances/situation.  Patient will need ongoing care regarding potential causative factors of her stroke and other medical issues and they are already looking at potential relationship to birth control and or other factors with medication changes being made.  Patient will be continued to be followed by Dr. Lucia Gaskins post discharge.  HPI for the current admission:    HPI: Faith Jordan is a 28 year old right-handed female with history significant for 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 Lucia Gaskins, on birth control, angioedema 2017,dysautonomia. Per chart review patient lives alone. Independent prior to admission. Working as a Systems analyst at [employer]. She plans to stay with her mother on discharge. Presented 01/07/2024 with acute onset of left-sided weakness and facial droop. She denied any headache.  Cranial CT scan showed no acute intracranial abnormality. CTA of head and neck unremarkable. Patient did not receive tPA. 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 Admission chemistries unremarkable except WBC of 12,700, urine drug screen negative. Beta-2 glycoprotein and lupus anticoagulant negative. Protein S and C activity and normal Antithrombin III normal. Echocardiogram with ejection fraction of 60 to 65% no wall motion abnormalities. TEE completed showing ejection fraction 65% no atrial appendage or thrombus. She is currently maintained on Plavix for CVA prophylaxis (allergy to NSAIDs thus was not placed on aspirin.. Lovenox for DVT prophylaxis. She currently remains on Topamax for headaches. Therapy evaluations completed due to patient decreased functional mobility left-sided weakness was admitted for a comprehensive rehab program.   Medical History:   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   Acute flank pain 06/02/2022   Angio-edema 04/27/2016   Angioedema    Aquagenic angio-edema-urticaria    Asthma    no problems /not used recently   Dysautonomia (HCC)    Dysrhythmia    sinus tachycardia   Fibroid    right breast, adenoma   Headache(784.0)    History of COVID-19 11/21/2020   Hives 12/22/2015   Pneumonia    hx  6th grade   Sensation of fullness in both ears 02/02/2023   Snoring 02/11/2022   Urinary frequency 02/12/2019   Vaginal yeast infection 06/02/2022   Vasculitis (HCC)          Patient Active Problem List   Diagnosis Date Noted   Coping style affecting medical condition 01/19/2024   Cerebrovascular accident (CVA) of right basal ganglia (HCC) 01/12/2024   Stroke of right basal ganglia (HCC) 01/07/2024   Non-restorative sleep 05/26/2022   Sleep paralysis, 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   Vivid dream 05/26/2022   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   Excessive daytime sleepiness 05/26/2022   Sleep disturbance 03/17/2022   Panic attacks 02/11/2022   Vitamin B12 deficiency 07/30/2021   Vitamin D deficiency  07/30/2021   Family history of hypothyroidism 07/30/2021   GAD (generalized anxiety disorder) 03/18/2020   Fatigue 11/06/2019   Chronic back pain 07/06/2019   Preventative health care 04/27/2018   Migraines 04/25/2018   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   Anemia 01/04/2018   Headache disorder 12/29/2015   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 her dress was Appropriate and she was Well Groomed and her manners were Appropriate to the situation.  her participation was indicative of Appropriate and Attentive behaviors.  There were physical disabilities noted.  she displayed an appropriate level of cooperation and motivation.    Interactions:    Active Appropriate  Attention:   within normal limits and attention span and concentration were age appropriate  Memory:   within normal limits; recent and remote memory intact  Visuo-spatial:   within normal limits  Speech (Volume):  normal  Speech:   normal; normal  Thought Process:  Coherent and Relevant  Coherent, Linear, and Logical  Though Content:  WNL; not suicidal and not homicidal  Orientation:   person, place, time/date, and situation  Judgment:   Good  Planning:   Good  Affect:    Appropriate  Mood:    Dysphoric  Insight:   Good  Intelligence:   normal  Psychiatric History:  No prior psychiatric history noted   Family Med/Psych History:  Family History  Problem Relation Age of Onset   Depression Mother    Migraines Mother    Narcolepsy Mother    Hypothyroidism Mother    Myasthenia gravis Maternal Grandfather    Breast  cancer Maternal Grandmother 60   Asthma Other    Cancer Other    Epilepsy Other    Allergic rhinitis Neg Hx    Angioedema Neg Hx    Atopy Neg Hx    Eczema Neg Hx    Immunodeficiency Neg Hx    Urticaria Neg Hx    Impression/DX:   Faith Jordan is a 28 year old female referred for neuropsychological consultation during the patient's ongoing admission to the comprehensive inpatient rehabilitation unit.  Patient had a recent right hemisphere stroke.  Patient has a history of familiar hemiplegic migraine and has been followed by neurology with Dr. Lucia Gaskins.  Full medical history can be found below for convenience and the patient's H&P.  Patient had presented on 01/07/2024 with acute onset left-sided weakness and facial droop.  No headache noted.  CT/CTA were unremarkable.  MRI showed a 10 mm acute nonhemorrhagic infarct in the posterior limb of the right internal capsule as well as finding of bilateral basal ganglia calcification raising possibility of mineralizing lenticulostraiate vasculopathy.  Patient actually had these calcifications noted in the basal ganglia bilaterally and an incidental finding back when she was roughly 28 years old.  Patient was stabilized medically.  Patient did not receive tPA.  Therapy evaluations were conducted and the patient was admitted to the comprehensive inpatient unit due to decreased functional mobility with left-sided weakness.  In-depth medical review was made as to potential causative factors for her stroke.  Patient has a long history of severe hemiplegic migraine and has had previous aura phase of migraines with TIA like events.  Concern for possible Fahr syndrome was reviewed but length of time since initial finding of these calcifications is roughly 20 years and unlikely related to this type of a condition.  During today's clinical interview the patient was oriented x 4 with good cognitive functioning.  Patient continues with significant left-sided motor deficits  primarily focused on left arm and hand.  Patient is ambulatory now but has weakness in the left leg but is able to stand and walk with assistance.  Patient is able to move her hand but has lost significant strength and fine motor control.  Patient is having difficulty coping with these issues and concerns for her capacity to return to her general life functioning persist.  Patient denies depression or anxiety to the point that she is not able to participate in therapies and her emotional response appears appropriate to the circumstances/situation.  Patient will need ongoing care regarding potential causative factors of her stroke and other medical issues and they are already looking at potential relationship to birth control and or other factors with medication changes being made.  Patient will be continued to be followed by Dr. Lucia Gaskins post discharge.  Disposition/Plan:  Today we worked on coping and adjustment issues and the patient appears to be managing these acute changes appropriate to the situation.  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  Her right leg is continuing to improve and she has movement in her left hand although significant weakness and fine motor control deficits noted.          Electronically Signed   _______________________ Arley Phenix, Psy.D. Clinical Neuropsychologist

## 2024-01-19 NOTE — Plan of Care (Signed)
  Problem: RH Swallowing Goal: LTG Patient will consume least restrictive diet using compensatory strategies with assistance (SLP) Description: LTG:  Patient will consume least restrictive diet using compensatory strategies with assistance (SLP) Outcome: Completed/Met   Problem: RH Expression Communication Goal: LTG Patient will increase speech intelligibility (SLP) Description: LTG: Patient will increase speech intelligibility at word/phrase/conversation level with cues, % of the time (SLP) Outcome: Completed/Met   Problem: RH Problem Solving Goal: LTG Patient will demonstrate problem solving for (SLP) Description: LTG:  Patient will demonstrate problem solving for basic/complex daily situations with cues  (SLP) Outcome: Completed/Met   Problem: RH Attention Goal: LTG Patient will demonstrate this level of attention during functional activites (SLP) Description: LTG:  Patient will will demonstrate this level of attention during functional activites (SLP) Outcome: Completed/Met

## 2024-01-19 NOTE — Evaluation (Signed)
 Recreational Therapy Assessment and Plan  Patient Details  Name: Faith Jordan MRN: [id number] Date of Birth: 02/11/1996 Today's Date: 01/19/2024  Rehab Potential:  Good ELOS:   d/c 4/12  Assessment  Hospital Problem: Principal Problem:   Stroke of right basal ganglia [facility]) Active Problems:   Cerebrovascular accident (CVA) of right basal ganglia (HCC)     Past Medical History:      Past Medical History:  Diagnosis Date   Acne     Acquired breast deformity 12/22/2015   Acute flank pain 06/02/2022   Angio-edema 04/27/2016   Angioedema     Aquagenic angio-edema-urticaria     Asthma      no problems /not used recently   Dysautonomia (HCC)     Dysrhythmia      sinus tachycardia   Fibroid      right breast, adenoma   Headache(784.0)     History of COVID-19 11/21/2020   Hives 12/22/2015   Pneumonia      hx  6th grade   Sensation of fullness in both ears 02/02/2023   Snoring 02/11/2022   Urinary frequency 02/12/2019   Vaginal yeast infection 06/02/2022   Vasculitis (HCC)          Past Surgical History:       Past Surgical History:  Procedure Laterality Date   ADENOIDECTOMY       BREAST LUMPECTOMY Right     BREAST LUMPECTOMY Right     MASS EXCISION Right 10/07/2014    Procedure: EXCISION OF RIGHT BREAST MASS;  Surgeon: Avel Peace, MD;  Location: [facility] OR;  Service: General;  Laterality: Right;   TONSILLECTOMY AND ADENOIDECTOMY       TOOTH EXTRACTION       TRANSESOPHAGEAL ECHOCARDIOGRAM (CATH LAB) N/A 01/10/2024    Procedure: TRANSESOPHAGEAL ECHOCARDIOGRAM;  Surgeon: Jake Bathe, MD;  Location: [facility] INVASIVE CV LAB;  Service: Cardiovascular;  Laterality: N/A;          Assessment & Plan Clinical Impression: Faith Jordan is a 28 year old right-handed female with history significant for 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 Lucia Gaskins, on birth control, angioedema 2017,dysautonomia. Per chart review patient lives  alone. Independent prior to admission. Working as a Systems analyst at [employer]. She plans to stay with her mother on discharge. Presented 01/07/2024 with acute onset of left-sided weakness and facial droop. She denied any headache. Cranial CT scan showed no acute intracranial abnormality. CTA of head and neck unremarkable. Patient did not receive tPA. 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 Admission chemistries unremarkable except WBC of 12,700, urine drug screen negative. Beta-2 glycoprotein and lupus anticoagulant negative. Protein S and C activity and normal Antithrombin III normal. Echocardiogram with ejection fraction of 60 to 65% no wall motion abnormalities. TEE completed showing ejection fraction 65% no atrial appendage or thrombus. She is currently maintained on Plavix for CVA prophylaxis (allergy to NSAIDs thus was not placed on aspirin.. Lovenox for DVT prophylaxis. She currently remains on Topamax for headaches.  Patient transferred to CIR on 01/12/2024 .    Pt presents with decreased activity tolerance, decreased functional mobility, decreased balance, decreased coordination, decreased attention, decreased problem solving, feelings of stress Limiting pt's independence with leisure/community pursuits.  Met with pt today to discuss TR services including leisure education, activity analysis/modifications and stress management.  Also discussed the importance of social, emotional, spiritual health in addition to physical health and their effects on overall health and wellness.  Pt stated understanding.   Plan   MIn 1 session >20 minutes during LOS Recommendations for other services: Neuropsych  Discharge Criteria: Patient will be discharged from TR if patient refuses treatment 3 consecutive times without medical reason.  If  treatment goals not met, if there is a change in medical status, if patient makes no progress towards goals or if patient is discharged from hospital.  The above assessment, treatment plan, treatment alternatives and goals were discussed and mutually agreed upon: by patient  Faith Jordan 01/19/2024, 8:44 AM

## 2024-01-19 NOTE — Progress Notes (Signed)
 Physical Therapy Session Note  Patient Details  Name: Faith Jordan MRN: [id number] Date of Birth: July 13, 1996  Today's Date: 01/19/2024 PT Individual Time: 5176-1607; 1350 - 1428 PT Individual Time Calculation (min): 73 min; 38 min   Short Term Goals: Week 1:  PT Short Term Goal 1 (Week 1): Pt will perform standing transfers with decreased impulsivity and overall CGA. PT Short Term Goal 2 (Week 1): Pt will ambulate community distances with overall CGA/ supervision and no LLE hyperextension. PT Short Term Goal 3 (Week 1): Pt will perform stairs with 1HR and overall supervision. PT Short Term Goal 4 (Week 1): Pt will improve Berg Balance score by at least 5 points.  SESSION 1 Skilled Therapeutic Interventions/Progress Updates: Patient supine in bed with nsg providing medication on entrance to room. Patient alert and agreeable to PT session.   Patient reported some unrated pain on L distal bicep, and proximal forearm (unrated) and some tightness in lateral/posterior neck musculature (light education on manual therapy with pt agreeing to participate in this session or afternoon session).  Therapeutic Activity: Bed Mobility: Pt performed supine<sit on EOB with supervision (HOB elevated).   - Pt self donned L sling wit no assistance required  Gait Training:  Pt ambulated from room<day room using no AD with close supervision. Pt demonstrated the following gait deviations with therapist providing the described cuing and facilitation for improvement:  - L genu recurvatum less than 50% of time. Pt recalls cues from attending PT to maintain soft-bend in L knee Pt ambulated from day room gym<main gym<room with same assistance and improved quadriceps eccentric control.  Pt ambulated around nsg/day room loop x 2 (300'+) following quadriceps control NMRE with improved control of eccentric to prevent hyperextension. Only one event noted. Close supervision throughout.  Neuromuscular Re-ed: NMR  facilitated during session with focus on neuromuscular connection to hemi-paretic side (L). - Quadriceps eccentric control on L LE with red theraband pulling into knee flexion - Short sitting edge of mat elevated with yellow theraband donned on dorsal aspect of L foot. 5lb ankle weight anchored on one side, and 1.5lb ankle weight donned on other side. PTA passively moved L LE into full extension at knee with VC for pt to control eccentric while PTA provided perturbations to ankle weights to further challenge eccentric control. 2 rounds performed close to fatigue - in // bars performing squat + posterior push back to stand with L UE on bar (PTA holding hand onto bar) with cues for pt to use L elbow extensors to assist in standing back up with CGA. PTA also providing manual resistance with shoulder when pt pushing back posteriorly  - in // bars with pt lunging forward performing same mechanics with same cues and assistance (no R UE support)  NMR performed for improvements in motor control and coordination, balance, sequencing, judgement, and self confidence/ efficacy in performing all aspects of mobility at highest level of independence.   Patient sitting EOB at end of session with brakes locked, bed alarm set, and all needs within reach.  SESSION 2 Skilled Therapeutic Interventions/Progress Updates: Patient supine in bed on entrance to room. Patient alert and agreeable to PT session.   Patient reported no pain or change since previous session.   Therapeutic Activity: Bed Mobility: Pt performed supine<sit on EOB with supervision and HOB elevated  Gait Training:  Pt ambulated from room<>main gym using no AD with supervision and one moment that required min/modA to prevent L LOB at beginning of session  after standing from EOB (pt stated it just took her a second to get acclimated to standing with no reports of lightheadedness). Pt with improved L genu recurvatum this session. Pt cued to increase L  step length with a few moments of mild LOB to the R but able to maintain standing balance without assistance.   Neuromuscular Re-ed: NMR facilitated during session with focus on postural control/dynamic standing balance. - Standing with back against wall with towel behind B UE with VC to keep towel on wall to decrease truncal compensation to assist with flexing L UE. PTA provided minA to elevate L UE closer to 90* of shoulder flexion with cues for pt to control eccentric. Pt also with decreased ability to isolate scapular elevation on L with improved ability after PTA manually maintained R scapula in neutral position. Pt instructed to keep R hand underneath glute while in bed to work on neuromuscular control of L scapular elevators in isolation.   NMR performed for improvements in motor control and coordination, balance, sequencing, judgement, and self confidence/ efficacy in performing all aspects of mobility at highest level of independence.   Therapeutic Exercise: Pt performed the following exercises with therapist providing the described cuing and facilitation for improvement. - B lateral cervical flexors stretch with demonstration and VC provided - B Levator scapula stretch with demonstration/VC provided for mechanics  Pt cued to stretch above throughout day to improve cervical/scapular ROM/musculature activation   Manual Therapy: Palpation of B upper trap musculature performed with trigger points noted. Education and rationale provided with pt agreeing to participate in intervention. - Trigger point release to stated area with soft tissue mobilization to follow throughout.   Patient sitting EOB at end of session with brakes locked, bed alarm set, and all needs within reach.       Therapy Documentation Precautions:  Precautions Precautions: Fall Precaution/Restrictions Comments: L hemipareisis Restrictions Weight Bearing Restrictions Per Provider Order: No  Therapy/Group:  Individual Therapy  Shaquan Puerta PTA 01/19/2024, 3:33 PM

## 2024-01-19 NOTE — Patient Care Conference (Signed)
 Inpatient RehabilitationTeam Conference and Plan of Care Update Date: 01/18/2024   Time: 10:32 AM    Patient Name: Faith Jordan      Medical Record Number: [id number]  Date of Birth: 22-Jun-1996 Sex: Female         Room/Bed: 4W01C/4W01C-01 Payor Info: Payor: [employer] / Plan: [place] [employer] PPO / Product Type: *No Product type* /    Admit Date/Time:  01/12/2024  1:43 PM  Primary Diagnosis:  Stroke of right basal ganglia [facility])  Hospital Problems: Principal Problem:   Stroke of right basal ganglia (HCC) Active Problems:   Cerebrovascular accident (CVA) of right basal ganglia (HCC)   Coping style affecting medical condition    Expected Discharge Date: Expected Discharge Date: 01/28/24  Team Members Present: Physician leading conference: Dr. Claudette Laws Social Worker Present: Dossie Der, LCSW Nurse Present: Chana Bode, RN PT Present: Ralph Leyden, PT OT Present: Mariann Barter, OT SLP Present: Everardo Pacific, SLP PPS Coordinator present : Fae Pippin, SLP     Current Status/Progress Goal Weekly Team Focus  Bowel/Bladder   continent x2   To remain continent   q4h toileting/ prn    Swallow/Nutrition/ Hydration   regular/thin   mod i  increasing oral strength for timely mastication and reducing fatigue    ADL's   UB BADLs min A, LB BADLs CGA, toileting CGA, ambulatory transfers CGA; improved functional use of L UE with digit extension initated now and voluntary grasp present // Barriers: L hemi, emotional during sessions, insight into deficits   mod I-sup   L UE NMRE, dynamic balance, Pt and family education, dynamic balance, BADL retraining, functional cognition    Mobility   Bed mobility = supervision/ Mod I; transfers = supervision; ambulation = CGA/supervision; higher level balance training and NMR = up to MinA for balance.   supervision overall with Mod I for sit<>stand and sitting balance  Barriers: L weakness in UE>LE, coordination deficits,  self defeating thoughts re: future ability to work /// Work on: continued Location manager, coordination of movements and improving quality of gait, general strengthening, family education    Communication   95-100% intelligibile   mod i   use of IOPI for lingual strength to increase articulatory precision    Safety/Cognition/ Behavioral Observations  supervision   mod i   complex problem solving, attention to task    Pain   Lower back is being tx with Lidocaine patch   Keeping pain level maintained at a 2 or lower        Skin   Skin is intact   Keeping skin dry         Discharge Planning:  Going home with parents who can provide 24/7 supervision. Neuro-psych to see. Doing well but has high expectations of herself   Team Discussion: Patient post right basal ganglia CVA with left hemiparesis; history of hemiplegic migraines.    Patient on target to meet rehab goals: yes, currently needs min assist for upper body care and CGA for lower body care with CGA for toileting.  Needs min assist for balance and knee control. Goals for discharge set for mod I - supervision overall.  *See Care Plan and progress notes for long and short-term goals.   Revisions to Treatment Plan:  E-stim Swedish Knee cage  Neuro psych consult Stress management class  Teaching Needs: Safety, medications, dietary modifications, transfers, toileting, etc.   Current Barriers to Discharge: Decreased caregiver support  Possible Resolutions to Barriers: Family  education OP follow up services DME: shower seat    Medical Summary Current Status: Hypokalemia persistent, electrolyte disturbance  Barriers to Discharge: Electrolyte abnormality   Possible Resolutions to Barriers/Weekly Focus: Discontinued bicarbonate, low-dose potassium supplement, continue to monitor serum potassium   Continued Need for Acute Rehabilitation Level of Care: The patient requires daily medical management by a physician  with specialized training in physical medicine and rehabilitation for the following reasons: Direction of a multidisciplinary physical rehabilitation program to maximize functional independence : Yes Medical management of patient stability for increased activity during participation in an intensive rehabilitation regime.: Yes Analysis of laboratory values and/or radiology reports with any subsequent need for medication adjustment and/or medical intervention. : Yes   I attest that I was present, lead the team conference, and concur with the assessment and plan of the team.   Chana Bode B 01/19/2024, 3:57 PM

## 2024-01-19 NOTE — Consult Note (Addendum)
 Stroke Neurology Consultation Note  Consult Requested by: Dr. Wynn Banker  Reason for Consult: stroke  Consult Date: 01/19/24   The history was obtained from the pt.  During history and examination, all items were able to obtain unless otherwise noted.  History of Present Illness:  Faith Jordan is a 28 y.o. Caucasian female with PMH of migraine and migraine with aura, family history of hemiplegic migraine, and OTC use admitted for right BG small infarct.  She stated that her mom has hemiplegic migraine but never diagnosed with stroke, hemiplegia lasting about 1 day or so and resolved.  Her sister also has migraine but no hemiplegia associate with migraine.  She had a migraine average 2/months, sometimes associate with left facial droop lasting 3 to 4 hours and resolved.  It occurs much less maybe average 1 every 2 to 3 months.  Sometimes the left facial droop without headache.  She was following with Dr. Lucia Gaskins in the past, on Emgality injection and felt to be effective.  She was later followed with [facility] NP Amy for headache.  On 01/06/2024, patient had acute onset left hemiplegia and left facial droop but without headache.  MRI showed right BG small infarct.  CT, CTA head and neck were negative, but interesting, there is b/l BG calcifications seen. Had extensive stroke workup including TEE, TTE, LE venous Doppler, TCD bubble study all negative.  LDL 106, A1c 4.9, UDS negative.  She was put on DAPT and statin.  Currently in CIR for rehab, and her symptoms gradually improving.  Currently patient still has question regarding etiology for her stroke and neurology was consulted for further clarification.  She stated that she has been using estrogen for OCT, and denies smoking, alcohol or illicit drug use.  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   Acute flank pain 06/02/2022   Angio-edema 04/27/2016   Angioedema    Aquagenic angio-edema-urticaria    Asthma     no problems /not used recently   Dysautonomia [facility])    Dysrhythmia    sinus tachycardia   Fibroid    right breast, adenoma   Headache(784.0)    History of COVID-19 11/21/2020   Hives 12/22/2015   Pneumonia    hx  6th grade   Sensation of fullness in both ears 02/02/2023   Snoring 02/11/2022   Urinary frequency 02/12/2019   Vaginal yeast infection 06/02/2022   Vasculitis (HCC)     Past Surgical History:  Procedure Laterality Date   ADENOIDECTOMY     BREAST LUMPECTOMY Right    BREAST LUMPECTOMY Right    MASS EXCISION Right 10/07/2014   Procedure: EXCISION OF RIGHT BREAST MASS;  Surgeon: Avel Peace, MD;  Location: [facility] OR;  Service: General;  Laterality: Right;   TONSILLECTOMY AND ADENOIDECTOMY     TOOTH EXTRACTION     TRANSESOPHAGEAL ECHOCARDIOGRAM (CATH LAB) N/A 01/10/2024   Procedure: TRANSESOPHAGEAL ECHOCARDIOGRAM;  Surgeon: Jake Bathe, MD;  Location: [facility] INVASIVE CV LAB;  Service: Cardiovascular;  Laterality: N/A;    Family History  Problem Relation Age of Onset   Depression Mother    Migraines Mother    Narcolepsy Mother    Hypothyroidism Mother    Myasthenia gravis Maternal Grandfather    Breast cancer Maternal Grandmother 18   Asthma Other    Cancer Other    Epilepsy Other    Allergic rhinitis Neg Hx    Angioedema Neg Hx    Atopy  Neg Hx    Eczema Neg Hx    Immunodeficiency Neg Hx    Urticaria Neg Hx     Social History:  reports that she has never smoked. She has never used smokeless tobacco. She reports that she does not drink alcohol and does not use drugs.  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   Current Outpatient Medications on File  Prior to Encounter  Medication Sig Dispense Refill   acetaminophen (TYLENOL) 325 MG tablet Take 2 tablets (650 mg total) by mouth every 6 (six) hours as needed for mild pain (pain score 1-3), fever or headache.     atorvastatin (LIPITOR) 80 MG tablet Take 1 tablet (80 mg total) by mouth daily.     clopidogrel (PLAVIX) 75 MG tablet Take 1 tablet (75 mg total) by mouth daily.     FLUoxetine (PROZAC) 40 MG capsule Take 1 capsule (40 mg total) by mouth daily. 90 capsule 3   Galcanezumab-gnlm (EMGALITY) 120 MG/ML SOAJ Inject 120 mg into the skin every 30 (thirty) days.     Levonorgestrel-Ethinyl Estradiol (DAYSEE) 0.15-0.03 &0.01 MG tablet Take 1 tablet by mouth daily.     LORazepam (ATIVAN) 0.5 MG tablet Take 1 tablet (0.5 mg total) by mouth 2 (two) times daily as needed for anxiety and panic attacks. 60 tablet 2   Rimegepant Sulfate (NURTEC) 75 MG TBDP Take 75 mg by mouth daily as needed (for abortive therapy of migraine, no more than 1 tablet in 24 hours or 10 per month.).     topiramate (TOPAMAX) 50 MG tablet Take 1 tablet (50 mg total) by mouth daily. May also take 1 tablet (50 mg total) daily as needed (daily during menstrual cycle). 180 tablet 3   traZODone (DESYREL) 50 MG tablet Take 1-3 tablets (50-150 mg total) by mouth at bedtime as needed for sleep. 270 tablet 3    Review of Systems: A full ROS was attempted today and was  able to be performed.  Systems assessed include - Constitutional, Eyes, HENT, Respiratory, Cardiovascular, Gastrointestinal, Genitourinary, Integument/breast, Hematologic/lymphatic, Musculoskeletal, Neurological, Behavioral/Psych, Endocrine, Allergic/Immunologic - with pertinent responses as per HPI.  Physical Examination: Temp:  [98.2 F (36.8 C)] 98.2 F (36.8 C) (04/03 0500) Pulse Rate:  [64-71] 64 (04/03 0500) Resp:  [16-18] 16 (04/03 0500) BP: (102-109)/(63-69) 102/68 (04/03 0500) SpO2:  [98 %-100 %] 98 % (04/03 0500)  General - well nourished, well developed,  in no apparent distress.    Ophthalmologic - fundi not visualized due to noncooperation.    Cardiovascular - regular rhythm and rate  Mental Status -  Level of arousal and orientation to time, place, and person were intact. Language including expression, naming, repetition, comprehension was assessed and found intact. Attention span and concentration were normal. Recent and remote memory were intact. Fund of Knowledge was assessed and was intact.  Cranial Nerves II - XII - II - Vision intact OU. III, IV, VI - Extraocular movements intact. V - Facial sensation intact bilaterally. VII - mild left facial droop VIII - Hearing & vestibular intact bilaterally. X - Palate elevates symmetrically. XI - Chin turning & shoulder shrug intact bilaterally. XII - Tongue protrusion intact.  Motor Strength - The  patient's strength was normal in right upper and lower extremities.  Left upper extremity proximal 3+/5, bicep and tricep 4/5, finger grip 4/5, finger extension 3-5, wrist extension 3/5.  Left lower extremity 5-/5 except ankle dorsiflexion 3+/5.  Motor Tone & Bulk - Muscle tone was assessed at the neck and appendages and was normal.  Bulk was normal and fasciculations were absent.   Reflexes - The patient's reflexes were normal in all extremities and she had no pathological reflexes.  Sensory - Light touch, temperature/pinprick were assessed and were normal.    Coordination - The patient had normal movements in the right hand with no ataxia or dysmetria.  Left FTN ataxic not out of program to the weakness.  Tremor was absent.  Gait and Station - deferred  Data Reviewed: ECHO TEE Result Date: 01/10/2024    TRANSESOPHOGEAL ECHO REPORT   Patient Name:   ADELHEID HOGGARD Date of Exam: 01/10/2024 Medical Rec #:  [id number]      Height:       67.0 in Accession #:    [id number]     Weight:       170.0 lb Date of Birth:  1996/09/25      BSA:          1.887 m Patient Age:    28 years       BP:            139/85 mmHg Patient Gender: F              HR:           96 bpm. Exam Location:  Inpatient Procedure: Transesophageal Echo, Color Doppler and Cardiac Doppler (Both            Spectral and Color Flow Doppler were utilized during procedure). Indications:     Stroke  History:         Patient has prior history of Echocardiogram examinations, most                  recent 01/08/2024.  Sonographer:     Harriette Bouillon RDCS Referring Phys:  Almon Hercules Diagnosing Phys: Donato Schultz MD PROCEDURE: The transesophogeal probe was passed without difficulty through the esophogus of the patient. Sedation performed by different physician. The patient's vital signs; including heart rate, blood pressure, and oxygen saturation; remained stable throughout the procedure. The patient developed no complications during the procedure.  IMPRESSIONS  1. Left ventricular ejection fraction, by estimation, is 60 to 65%. The left ventricle has normal function. The left ventricle has no regional wall motion abnormalities.  2. Right ventricular systolic function is normal. The right ventricular size is normal.  3. No left atrial/left atrial appendage thrombus was detected.  4. The mitral valve is normal in structure. No evidence of mitral valve regurgitation. No evidence of mitral stenosis.  5. The aortic valve is normal in structure. Aortic valve regurgitation is not visualized. No aortic stenosis is present.  6. The inferior vena cava is normal in size with greater than 50% respiratory variability, suggesting right atrial pressure of 3 mmHg.  7. Agitated saline contrast bubble study was negative, with no evidence of any interatrial shunt. Conclusion(s)/Recommendation(s): Normal biventricular function without evidence of hemodynamically significant valvular heart disease. FINDINGS  Left Ventricle: Left ventricular ejection fraction, by estimation, is 60 to 65%. The left ventricle has normal function. The left ventricle has no regional wall motion  abnormalities. The left ventricular internal cavity size was normal in size.  There is  no left ventricular hypertrophy. Right Ventricle: The right ventricular size is normal. No increase in right ventricular wall thickness. Right ventricular systolic function is normal. Left Atrium: Left atrial size was normal in size. No left atrial/left atrial appendage thrombus was detected. Right Atrium: Right atrial size was normal in size. Pericardium: There is no evidence of pericardial effusion. Mitral Valve: The mitral valve is normal in structure. No evidence of mitral valve regurgitation. No evidence of mitral valve stenosis. Tricuspid Valve: The tricuspid valve is normal in structure. Tricuspid valve regurgitation is not demonstrated. No evidence of tricuspid stenosis. Aortic Valve: The aortic valve is normal in structure. Aortic valve regurgitation is not visualized. No aortic stenosis is present. Pulmonic Valve: The pulmonic valve was normal in structure. Pulmonic valve regurgitation is not visualized. No evidence of pulmonic stenosis. Aorta: The aortic root is normal in size and structure. Venous: The inferior vena cava is normal in size with greater than 50% respiratory variability, suggesting right atrial pressure of 3 mmHg. IAS/Shunts: There is redundancy of the interatrial septum. No atrial level shunt detected by color flow Doppler. Agitated saline contrast bubble study was negative, with no evidence of any interatrial shunt. There is no evidence of a patent foramen ovale. There is no evidence of an atrial septal defect. Donato Schultz MD Electronically signed by Donato Schultz MD Signature Date/Time: 01/10/2024/12:30:28 PM    Final    EP STUDY Result Date: 01/10/2024 See surgical note for result.  VAS [place] TRANSCRANIAL DOPPLER W BUBBLES Result Date: 01/09/2024  Transcranial Doppler with Bubble Patient Name:  CHLOE BLUETT Channing  Date of Exam:   01/08/2024 Medical Rec #: [id number]       Accession #:    [id number] Date of  Birth: May 03, 1996       Patient Gender: F Patient Age:   41 years Exam Location:  [facility] Procedure:      VAS [place] TRANSCRANIAL DOPPLER W BUBBLES Referring Phys: Gevena Mart --------------------------------------------------------------------------------  Indications: Stroke. History: Left side weakness, bilateral tingling of arms, aphasia. History of hemiplegic migraines. Limitations for diagnostic windows: Unable to insonate right transtemporal window. Comparison Study: No prior study Performing Technologist: Sherren Kerns RVS  Examination Guidelines: A complete evaluation includes B-mode imaging, spectral Doppler, color Doppler, and power Doppler as needed of all accessible portions of each vessel. Bilateral testing is considered an integral part of a complete examination. Limited examinations for reoccurring indications may be performed as noted.  Summary: No HITS at rest or during Valsalva. Negative transcranial Doppler Bubble study with no evidence of right to left intracardiac communication.  A vascular evaluation was performed. The right middle cerebral artery was studied. An IV was inserted into the patient's left Forearm. Verbal informed consent was obtained.  Negative TCD Bubble study *See table(s) above for TCD measurements and observations.  Diagnosing physician: Delia Heady MD Electronically signed by Delia Heady MD on 01/09/2024 at 1:34:41 PM.    Final    VAS [place] LOWER EXTREMITY VENOUS (DVT) Result Date: 01/08/2024  Lower Venous DVT Study Patient Name:  ALLISHA HARTER Harries  Date of Exam:   01/08/2024 Medical Rec #: [id number]       Accession #:    [id number] Date of Birth: 1995/11/24       Patient Gender: F Patient Age:   60 years Exam Location:  [facility] Procedure:      VAS [place] LOWER EXTREMITY VENOUS (DVT) Referring Phys: Gevena Mart --------------------------------------------------------------------------------  Indications: Stroke.  Limitations:  Depth of vessels. Comparison Study:  No prior study on file Performing Technologist: Sherren Kerns RVS  Examination Guidelines: A complete evaluation includes B-mode imaging, spectral Doppler, color Doppler, and power Doppler as needed of all accessible portions of each vessel. Bilateral testing is considered an integral part of a complete examination. Limited examinations for reoccurring indications may be performed as noted. The reflux portion of the exam is performed with the patient in reverse Trendelenburg.  +---------+---------------+---------+-----------+----------+--------------+ RIGHT    CompressibilityPhasicitySpontaneityPropertiesThrombus Aging +---------+---------------+---------+-----------+----------+--------------+ CFV      Full           Yes      Yes                                 +---------+---------------+---------+-----------+----------+--------------+ SFJ      Full                                                        +---------+---------------+---------+-----------+----------+--------------+ FV Prox  Full                                                        +---------+---------------+---------+-----------+----------+--------------+ FV Mid   Full                                                        +---------+---------------+---------+-----------+----------+--------------+ FV DistalFull           Yes      Yes                                 +---------+---------------+---------+-----------+----------+--------------+ PFV      Full                                                        +---------+---------------+---------+-----------+----------+--------------+ POP      Full           Yes      Yes                                 +---------+---------------+---------+-----------+----------+--------------+ PTV      Full                                                        +---------+---------------+---------+-----------+----------+--------------+ PERO     Full                                                         +---------+---------------+---------+-----------+----------+--------------+  Gastroc  Full                                                        +---------+---------------+---------+-----------+----------+--------------+   +---------+---------------+---------+-----------+----------+-------------------+ LEFT     CompressibilityPhasicitySpontaneityPropertiesThrombus Aging      +---------+---------------+---------+-----------+----------+-------------------+ CFV      Full           Yes      Yes                                      +---------+---------------+---------+-----------+----------+-------------------+ SFJ      Full                                                             +---------+---------------+---------+-----------+----------+-------------------+ FV Prox  Full                                                             +---------+---------------+---------+-----------+----------+-------------------+ FV Mid   Full                                                             +---------+---------------+---------+-----------+----------+-------------------+ FV DistalFull           Yes      Yes                                      +---------+---------------+---------+-----------+----------+-------------------+ PFV      Full                                                             +---------+---------------+---------+-----------+----------+-------------------+ POP                     Yes      Yes                  patent by color and                                                       Doppler             +---------+---------------+---------+-----------+----------+-------------------+ PTV      Full                                                             +---------+---------------+---------+-----------+----------+-------------------+  PERO     Full                                                              +---------+---------------+---------+-----------+----------+-------------------+     Summary: BILATERAL: - No evidence of deep vein thrombosis seen in the lower extremities, bilaterally. -No evidence of popliteal cyst, bilaterally.   *See table(s) above for measurements and observations. Electronically signed by Lemar Livings MD on 01/08/2024 at 5:39:09 PM.    Final    ECHOCARDIOGRAM COMPLETE Result Date: 01/08/2024    ECHOCARDIOGRAM REPORT   Patient Name:   AMEERA TIGUE Verbeek Date of Exam: 01/08/2024 Medical Rec #:  [id number]      Height:       67.0 in Accession #:    [id number]     Weight:       170.0 lb Date of Birth:  [birth year]      BSA:          1.887 m Patient Age:    28 years       BP:           132/74 mmHg Patient Gender: F              HR:           87 bpm. Exam Location:  Inpatient Procedure: 2D Echo, Color Doppler and Cardiac Doppler (Both Spectral and Color            Flow Doppler were utilized during procedure). Indications:    Stroke  History:        Patient has prior history of Echocardiogram examinations, most                 recent 02/10/2021.  Sonographer:    Rosaland Lao Referring Phys: [id number] CAROLE N HALL IMPRESSIONS  1. Left ventricular ejection fraction, by estimation, is 60 to 65%. The left ventricle has normal function. The left ventricle has no regional wall motion abnormalities. Left ventricular diastolic parameters were normal.  2. Right ventricular systolic function is normal. The right ventricular size is normal.  3. The mitral valve is normal in structure. Trivial mitral valve regurgitation. No evidence of mitral stenosis.  4. The aortic valve is normal in structure. Aortic valve regurgitation is not visualized. No aortic stenosis is present.  5. The inferior vena cava is normal in size with greater than 50% respiratory variability, suggesting right atrial pressure of 3 mmHg. Conclusion(s)/Recommendation(s): Normal biventricular function without  evidence of hemodynamically significant valvular heart disease. FINDINGS  Left Ventricle: Left ventricular ejection fraction, by estimation, is 60 to 65%. The left ventricle has normal function. The left ventricle has no regional wall motion abnormalities. The left ventricular internal cavity size was normal in size. There is  no left ventricular hypertrophy. Left ventricular diastolic parameters were normal. Right Ventricle: The right ventricular size is normal. No increase in right ventricular wall thickness. Right ventricular systolic function is normal. Left Atrium: Left atrial size was normal in size. Right Atrium: Right atrial size was normal in size. Pericardium: There is no evidence of pericardial effusion. Mitral Valve: The mitral valve is normal in structure. Trivial mitral valve regurgitation. No evidence of mitral valve stenosis. Tricuspid Valve: The tricuspid valve is normal in structure. Tricuspid valve regurgitation is not demonstrated. No  evidence of tricuspid stenosis. Aortic Valve: The aortic valve is normal in structure. Aortic valve regurgitation is not visualized. No aortic stenosis is present. Pulmonic Valve: The pulmonic valve was normal in structure. Pulmonic valve regurgitation is not visualized. No evidence of pulmonic stenosis. Aorta: The aortic root is normal in size and structure. Venous: The inferior vena cava is normal in size with greater than 50% respiratory variability, suggesting right atrial pressure of 3 mmHg. IAS/Shunts: No atrial level shunt detected by color flow Doppler.  LEFT VENTRICLE PLAX 2D LVIDd:         4.20 cm   Diastology LVIDs:         2.60 cm   LV e' medial:    11.90 cm/s LV PW:         0.70 cm   LV E/e' medial:  8.0 LV IVS:        0.80 cm   LV e' lateral:   17.00 cm/s LVOT diam:     1.90 cm   LV E/e' lateral: 5.6 LV SV:         48 LV SV Index:   26 LVOT Area:     2.84 cm  RIGHT VENTRICLE             IVC RV Basal diam:  3.00 cm     IVC diam: 1.10 cm RV S prime:      13.90 cm/s TAPSE (M-mode): 2.0 cm LEFT ATRIUM             Index        RIGHT ATRIUM          Index LA diam:        3.20 cm 1.70 cm/m   RA Area:     9.40 cm LA Vol (A2C):   32.4 ml 17.17 ml/m  RA Volume:   17.60 ml 9.33 ml/m LA Vol (A4C):   31.0 ml 16.43 ml/m LA Biplane Vol: 31.6 ml 16.74 ml/m  AORTIC VALVE LVOT Vmax:   84.60 cm/s LVOT Vmean:  57.700 cm/s LVOT VTI:    0.170 m  AORTA Ao Root diam: 2.20 cm Ao Asc diam:  2.40 cm MITRAL VALVE MV Area (PHT): 5.27 cm    SHUNTS MV Decel Time: 144 msec    Systemic VTI:  0.17 m MV E velocity: 95.00 cm/s  Systemic Diam: 1.90 cm MV A velocity: 62.20 cm/s MV E/A ratio:  1.53 Donato Schultz MD Electronically signed by Donato Schultz MD Signature Date/Time: 01/08/2024/10:00:37 AM    Final    MR BRAIN W WO CONTRAST Result Date: 01/07/2024 CLINICAL DATA:  New onset left-sided weakness. EXAM: MRI HEAD WITHOUT AND WITH CONTRAST TECHNIQUE: Multiplanar, multiecho pulse sequences of the brain and surrounding structures were obtained without and with intravenous contrast. CONTRAST:  8mL GADAVIST GADOBUTROL 1 MMOL/ML IV SOLN COMPARISON:  CT head without contrast and CT angio head and neck 01/07/2024. FINDINGS: Brain: A 10 mm acute nonhemorrhagic infarct is present in the posterior limb of the right internal capsule. T2 and FLAIR hyperintensities are consistent with the time frame nearly 24 hours. No acute hemorrhage or mass lesion is present. Susceptibility related calcifications in the globus pallidus noted bilaterally. Basal ganglia are otherwise within normal limits. The ventricles are of normal size. No significant extraaxial fluid collection is present. The brainstem and cerebellum are within normal limits. Midline structures are within normal limits. Vascular: Flow is present in the major intracranial arteries. Skull and upper cervical spine: The craniocervical junction is  normal. Upper cervical spine is within normal limits. Marrow signal is unremarkable. Sinuses/Orbits: The  paranasal sinuses and mastoid air cells are clear. The globes and orbits are within normal limits. IMPRESSION: 1. 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 2. Susceptibility related calcifications in the globus pallidus bilaterally. This likely reflects the sequela of chronic microvascular ischemia. The above was relayed via text pager to Dr. Wilford Corner on 01/07/2024 at 20:52 . Electronically Signed   By: Marin Roberts M.D.   On: 01/07/2024 20:55   CT ANGIO HEAD NECK W WO CM (CODE STROKE) Result Date: 01/07/2024 CLINICAL DATA:  Left hemiplegia. EXAM: CT ANGIOGRAPHY HEAD AND NECK WITH AND WITHOUT CONTRAST TECHNIQUE: Multidetector CT imaging of the head and neck was performed using the standard protocol during bolus administration of intravenous contrast. Multiplanar CT image reconstructions and MIPs were obtained to evaluate the vascular anatomy. Carotid stenosis measurements (when applicable) are obtained utilizing NASCET criteria, using the distal internal carotid diameter as the denominator. RADIATION DOSE REDUCTION: This exam was performed according to the departmental dose-optimization program which includes automated exposure control, adjustment of the mA and/or kV according to patient size and/or use of iterative reconstruction technique. CONTRAST:  75mL OMNIPAQUE IOHEXOL 350 MG/ML SOLN COMPARISON:  CT head without contrast 01/07/2024 at 6:41 p.m. FINDINGS: CTA NECK FINDINGS Aortic arch: A 3 vessel arch configuration is present. No significant atherosclerotic changes are present at the aortic arch. Great vessel origins are widely patent. No aneurysm or dissection is present. Right carotid system: The right common carotid artery is within normal limits. Bifurcation is within normal limits. The cervical right ICA is normal. Left carotid system: The left common carotid artery is within normal limits. Bifurcation is unremarkable. Cervical left ICA is normal.  Vertebral arteries: The vertebral arteries are codominant. Both vertebral arteries originate from the subclavian arteries without significant stenosis. No significant stenosis is present in either vertebral artery in the neck. Skeleton: The vertebral body heights and alignment are normal. Mild reversal of the normal cervical lordosis may be positional. No focal osseous lesions are present. Other neck: The soft tissues of the neck are otherwise unremarkable. Salivary glands are within normal limits. Thyroid is normal. No significant adenopathy is present. No focal mucosal or submucosal lesions are present. Upper chest: The lung apices are clear. The thoracic inlet is within normal limits. Review of the MIP images confirms the above findings CTA HEAD FINDINGS Anterior circulation: Internal carotid arteries are within normal limits from the skull base to the ICA termini. The A1 and M1 segments are normal. The anterior communicating artery is patent. MCA bifurcations are normal bilaterally. The ACA and MCA branch vessels are normal bilaterally. No aneurysm is present. Posterior circulation: PICA origins are visualized and normal. The vertebrobasilar junction and basilar artery are normal. The superior cerebellar arteries are patent bilaterally. Both posterior cerebral arteries originate from basilar tip. Small posterior communicating arteries are present. The PCA branch vessels are normal bilaterally. No aneurysm is present. Venous sinuses: The dural sinuses are patent. The straight sinus and deep cerebral veins are intact. Cortical veins are within normal limits. No significant vascular malformation is evident. Anatomic variants: None Review of the MIP images confirms the above findings IMPRESSION: 1. Normal CTA of the neck. 2. Normal CTA Circle of Willis without significant proximal stenosis, aneurysm, or branch vessel occlusion. These results were called by telephone at the time of interpretation on 01/07/2024 at 7:00  pm to provider ERIC LINDZEN ,  who verbally acknowledged these results. Electronically Signed   By: Marin Roberts M.D.   On: 01/07/2024 19:05   CT HEAD CODE STROKE WO CONTRAST Result Date: 01/07/2024 CLINICAL DATA:  Code stroke. Left-sided paralysis. History of migraines. EXAM: CT HEAD WITHOUT CONTRAST TECHNIQUE: Contiguous axial images were obtained from the base of the skull through the vertex without intravenous contrast. RADIATION DOSE REDUCTION: This exam was performed according to the departmental dose-optimization program which includes automated exposure control, adjustment of the mA and/or kV according to patient size and/or use of iterative reconstruction technique. COMPARISON:  Report of CT head without contrast 04/28/2005. FINDINGS: Brain: Dense calcifications are present within the globus pallidus bilaterally. No acute infarct, hemorrhage, or mass lesion is present. No significant white matter lesions are present. The deep brain nuclei are otherwise within normal limits. The ventricles are of normal size. No significant extraaxial fluid collection is present. The brainstem and cerebellum are within normal limits. Midline structures are within normal limits. Vascular: No hyperdense vessel or unexpected calcification. Skull: Calvarium is intact. No focal lytic or blastic lesions are present. No significant extracranial soft tissue lesion is present. Sinuses/Orbits: The paranasal sinuses and mastoid air cells are clear. The globes and orbits are within normal limits. ASPECTS [facility] Stroke Program Early CT Score) - Ganglionic level infarction (caudate, lentiform nuclei, internal capsule, insula, M1-M3 cortex): 7/7 - Supraganglionic infarction (M4-M6 cortex): 3/3 Total score (0-10 with 10 being normal): 10/10 IMPRESSION: 1. No acute intracranial abnormality or significant interval change. 2. Aspects is 10/10. 3. Dense calcifications within the globus pallidus bilaterally. This is a nonspecific  finding but can be seen in the setting of Fahr's disease. ASPECTS is Electronically Signed   By: Marin Roberts M.D.   On: 01/07/2024 18:48    Assessment and Plan: 28 y.o. female with PMH of migraine and migraine with aura, family history of hemiplegic migraine, and OTC use admitted for right BG small infarct.  CTA head and neck were negative, but interesting, there is b/l BG calcifications seen.  Had extensive stroke workup including TEE, TTE, LE venous Doppler, TCD bubble study all negative.  LDL 106, A1c 4.9, UDS negative.  She is yeah on DAPT and statin.  Currently in CIR for rehab, and her symptoms gradually improving.    My thought is that she has family hx of hemiplegic migraine and she had episodes before with left facial droop with or without migraine, so she might have so called "migrainous stroke" with the focus at right BG where her previous left facial droop coming from. Estrogen use further facilitated the event. Mutation of one of the hemiplegic migraine gene, CACNA1 that is associated with calcium channel, might causes b/l BG calcification although more often seen in children, but it is very rare so no extensive study on it. I will recommend a genetic testing in the future as outpt. She needs to continue migraine prevention and stay away for OCT for future stroke prevention. she was using Emgality for migriane and felt effective, I would continue it as outpt and continue to follow up with Dr. Lucia Gaskins. Continue current medication for secondary stroke prevention too. Continue PT/OT.   Thank you for this consultation and allowing [place] to participate in the care of this patient. Discussed with Dr. Shirley Friar, MD PhD Stroke Neurology 01/19/2024 2:07 PM

## 2024-01-19 NOTE — Progress Notes (Signed)
 PROGRESS NOTE   Subjective/Complaints:  Discussed d/c date, tearful in physical therapy worried about having another stroke, states that she talked to neuropsych yesterday although do not see evaluation.  ROS- see HPI. neg CP, SOB, abd pain, N/V/D  Objective:   No results found. No results for input(s): "WBC", "HGB", "HCT", "PLT" in the last 72 hours.  Recent Labs    01/18/24 0525  NA 140  K 3.4*  CL 109  CO2 21*  GLUCOSE 88  BUN 8  CREATININE 0.75  CALCIUM 8.9    Intake/Output Summary (Last 24 hours) at 01/19/2024 0848 Last data filed at 01/18/2024 1400 Gross per 24 hour  Intake 720 ml  Output --  Net 720 ml        Physical Exam: Vital Signs Blood pressure 102/68, pulse 64, temperature 98.2 F (36.8 C), temperature source Oral, resp. rate 16, height 5\' 7"  (1.702 m), weight 75 kg, SpO2 98%.   General: No acute distress, resting in bed comfortably.  Mood and affect are appropriate Heart: Regular rate and rhythm no rubs murmurs or extra sounds Lungs: Clear to auscultation, breathing unlabored, no rales or wheezes Abdomen: Positive bowel sounds, soft nontender to palpation, nondistended Extremities: No clubbing, cyanosis, or edema Skin: No evidence of breakdown, no evidence of rash over exposed surfaces.  [name]: L arm weakness, L leg weakness Neuro: A&O x3  PRIOR EXAMS: Neurologic: Cranial nerves II through XII intact, motor strength is 5/5 in right and 3-/5 left deltoid, bicep, tricep, grip, 5/5 RIght and 4- left hip flexor, knee extensors, 5/5 right and 3- left ankle dorsiflexor and plantar flexor  Sensory exam normal sensation to light touch  in bilateral upper and lower extremities Cerebellar exam limited by weakness LUE  Musculoskeletal: Full passive range of motion in all 4 extremities. No joint swelling 1FB subluxation ,    Assessment/Plan: 1. Functional deficits which require 3+ hours per day of  interdisciplinary therapy in a comprehensive inpatient rehab setting. Physiatrist is providing close team supervision and 24 hour management of active medical problems listed below. Physiatrist and rehab team continue to assess barriers to discharge/monitor patient progress toward functional and medical goals  Care Tool:  Bathing    Body parts bathed by patient: Left arm, Chest, Abdomen, Front perineal area, Buttocks, Right upper leg, Left upper leg, Face, Right lower leg, Left lower leg   Body parts bathed by helper: Right arm, Right lower leg, Left lower leg     Bathing assist Assist Level: Minimal Assistance - Patient > 75%     Upper Body Dressing/Undressing Upper body dressing   What is the patient wearing?: Pull over shirt    Upper body assist Assist Level: Minimal Assistance - Patient > 75%    Lower Body Dressing/Undressing Lower body dressing      What is the patient wearing?: Pants     Lower body assist Assist for lower body dressing: Minimal Assistance - Patient > 75%     Toileting Toileting    Toileting assist Assist for toileting: Minimal Assistance - Patient > 75%     Transfers Chair/bed transfer  Transfers assist     Chair/bed transfer assist level:  Moderate Assistance - Patient 50 - 74%     Locomotion Ambulation   Ambulation assist      Assist level: Minimal Assistance - Patient > 75% Assistive device: No Device Max distance: 250 ft   Walk 10 feet activity   Assist     Assist level: Minimal Assistance - Patient > 75% Assistive device: No Device   Walk 50 feet activity   Assist    Assist level: Minimal Assistance - Patient > 75% Assistive device: No Device    Walk 150 feet activity   Assist    Assist level: Minimal Assistance - Patient > 75% Assistive device: No Device    Walk 10 feet on uneven surface  activity   Assist Walk 10 feet on uneven surfaces activity did not occur: Safety/medical concerns          Wheelchair     Assist Is the patient using a wheelchair?: No Type of Wheelchair: Manual Wheelchair activity did not occur: Refused         Wheelchair 50 feet with 2 turns activity    Assist    Wheelchair 50 feet with 2 turns activity did not occur: Refused       Wheelchair 150 feet activity     Assist  Wheelchair 150 feet activity did not occur: Refused       Blood pressure 102/68, pulse 64, temperature 98.2 F (36.8 C), temperature source Oral, resp. rate 16, height 5\' 7"  (1.702 m), weight 75 kg, SpO2 98%.  Medical Problem List and Plan: 1. 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             -patient may  shower             -ELOS/Goals: 4/12 days- supervision- Mod I             -Continue CIR 2.  Antithrombotics: -DVT/anticoagulation:  Pharmaceutical: Lovenox 40mg  daily -antiplatelet therapy: Plavix 75 mg daily.  Patient with allergy to NSAIDs thus was not placed on aspirin 3. Pain Management: Lido Derm patch 4. Mood/Behavior/Sleep: Prozac 40 mg daily, melatonin 5 mg nightly, Ativan 1 mg every 6 hours as needed anxiety, trazodone 100mg  nightly             -antipsychotic agents: N/A 5. Neuropsych/cognition: This patient is capable of making decisions on her own behalf. 6. Skin/Wound Care: Routine skin check 7. Fluids/Electrolytes/Nutrition: Continue sodium bicarb 650mg  TID. Routine in and outs with follow-up chemistries  Pt states she has always had low CO2 levels , did not take bicarb at home, now has low K+ no diuretics, suspect due to bicarb supplementation - d/c bicarb , give 1x dose of KCL and monitor     Latest Ref Rng & Units 01/18/2024    5:25 AM 01/16/2024    5:27 AM 01/14/2024    5:34 AM  BMP  Glucose 70 - 99 mg/dL 88  92    BUN 6 - 20 mg/dL 8  6     Creatinine 1.61 - 1.00 mg/dL 0.96  0.45    Sodium 409 - 145 mmol/L 140  142    Potassium 3.5 - 5.1 mmol/L 3.4  3.4  3.7   Chloride 98 - 111 mmol/L 109  109    CO2 22 - 32 mmol/L 21  22    Calcium 8.9 -  10.3 mg/dL 8.9  9.1     Recheck still low will need daily KCL recheck 4/7 8.  Current History of hemiplegic migraines.  Followed by neurology service Dr. Lucia Gaskins.  Continue Topamax 50mg  daily +50mg  daily PRN- per pt, took off Emgality (which is due ~3/28) as well as Nurtec-  May use prn fioricet 9.  Hyperlipidemia.  Lipitor 80mg  daily 10. 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 Bowel management: continue fibercon and acidophilus, imodium 1mg  q6h PRN, miralax daily PRN Improved   LOS: 7 days A FACE TO FACE EVALUATION WAS PERFORMED  Erick Colace 01/19/2024, 8:48 AM

## 2024-01-19 NOTE — Progress Notes (Signed)
 Occupational Therapy Session Note  Patient Details  Name: Faith Jordan MRN: [id number] Date of Birth: 01/02/1996  Today's Date: 01/19/2024 OT Individual Time: 1102-1200 OT Individual Time Calculation (min): 58 min    Short Term Goals: Week 1:  OT Short Term Goal 1 (Week 1): Pt will utilize L UE as a gross assist during BADLs with min A OT Short Term Goal 2 (Week 1): Pt will complete toilet transfers CGA usign LRAD OT Short Term Goal 3 (Week 1): Pt will complete grooming/hygine tasks with set-up A using hemi-techniques PRN OT Short Term Goal 4 (Week 1): Pt will complete LB dressing using LRAD and hemi techniques PRN with CGA  Skilled Therapeutic Interventions/Progress Updates:     Pt received deeply sleeping in bed waking upon OT arrival presenting to be fatigued, however in good spirits receptive to skilled OT session reporting 0/10 pain- OT offering intermittent rest breaks, repositioning, and therapeutic support to optimize participation in therapy session. Focused this session on L UE NMRE to improve overall functional use for BADLs and activity tolerance. Pt transitioned to EOB supervision using bed rails and completed functional mobility to therapy gym no AD CGA +increased time- Pt noted to have L knee instability with hyperextension during functional mobility requiring mod verbal cues to correct and increased time. On the way to the gym, engaged Pt in short visit with therapy dog to support moral for therapy session with Pt able to utilized L UE to pet dog with light min A provided for motor control. Sitting EOM, engaged Pt in completing L UE NMRE moving through functional movement patterns with OT providing gentle resistance for improved NMRE with Pt completing 1x8 reps of grasp/release, wrist extension/flexion, supination/pronation with maintained grasp, elbow flexion/extension, internal external/rotation, and shoulder flexion. Pt presenting with improved wrist and digit extension 3/5. In  supine position, donned e-stim onto Pt's L tricep and anterior deltoid to facilitate shoulder flexion in combination with elbow extension. Engaged Pt completing functional movement patterns of anterior reaching and shoulder flexion to 90 degree with sustained elbow extension during on phase of e-stim with Pt providing tactile cues to min A for control. Pt then complete B UE movements while holding 1# weighted dowel completing chest press, bicep curls, and B UE shoulder flexion with CGA when giving maximal effort while OT provided consistent verbal and tactile cues for muscle engagement and to avoid compensatory movement patterns. No redness or irritation noted when e-stim was removed. Pt ambulated back to room CGA. Pt was left resting in bed with call bell in reach, bed alarm on, and all needs met.    Therapy Documentation Precautions:  Precautions Precautions: Fall Precaution/Restrictions Comments: L hemipareisis Restrictions Weight Bearing Restrictions Per Provider Order: No   Therapy/Group: Individual Therapy  Clide Deutscher 01/19/2024, 12:00 PM

## 2024-01-19 NOTE — Progress Notes (Signed)
 Patient ID: Faith Jordan, female   DOB: [birth year], 28 y.o.   MRN: [id number]  Given pt STD forms hopefully completed right. Mom aware of team conference update and goals and target discharge date.

## 2024-01-19 NOTE — Plan of Care (Signed)
  Problem: Consults Goal: RH STROKE PATIENT EDUCATION Description: See Patient Education module for education specifics  Outcome: Progressing   Problem: RH SAFETY Goal: RH STG ADHERE TO SAFETY PRECAUTIONS W/ASSISTANCE/DEVICE Description: STG Adhere to Safety Precautions With cues Assistance/Device. Outcome: Progressing   Problem: RH PAIN MANAGEMENT Goal: RH STG PAIN MANAGED AT OR BELOW PT'S PAIN GOAL Description: < 4 with prns Outcome: Progressing   Problem: RH KNOWLEDGE DEFICIT Goal: RH STG INCREASE KNOWLEGDE OF HYPERLIPIDEMIA Description: Patient and family will be able to manage HLD using educational resources for medications and dietary modifications independently Outcome: Progressing Goal: RH STG INCREASE KNOWLEDGE OF STROKE PROPHYLAXIS Description: Patient and family will be able to manage secondary stroke risks using educational resources for medications and dietary modifications independently Outcome: Progressing

## 2024-01-19 NOTE — Progress Notes (Signed)
 Speech Language Pathology Discharge Summary  Patient Details  Name: Faith Jordan MRN: [id number] Date of Birth: [birth year]  Date of Discharge from SLP service:January 19, 2024  Today's Date: 01/19/2024 SLP Individual Time: 1430-1530 SLP Individual Time Calculation (min): 60 min   Skilled Therapeutic Interventions:   Skilled therapy session focused on cognitive goals. 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 Patient was prompted to utilize problem solving abilities to make quick, clinical decisions given limited information. Patient completed task with limited support from SLP, indicative of meeting all cognitive goals. Patient reports improving mastication and lingual strength, aiding in eating and communication. Patient is now utilizing all strategies independently and may be d/c from ST services. Patient left in bed with alarm set and call bell in reach. Continue POC.     Patient has met 4 of 4 long term goals.  Patient to discharge at overall Modified Independent level.  Reasons goals not met: n/a   Clinical Impression/Discharge Summary: Pt has made excellent gains and has met 4 of 4 LTG's this admission due to improved cognition, communication and dysphagia. Pt is currently an overall mod I A for cognitive tasks and requires mod i cues for utilization of swallowing compensatory strategies to minimize overt s/sx of aspiration with regular/thin diet. Patient is 100% intelligible with mod I use of speech intelligibility strategies. Pt/family education complete and pt will discharge home with supervision from friends/family/etc. No f/u ST services warranted at this time.   Care Partner:  Caregiver Able to Provide Assistance: Yes  Type of Caregiver Assistance: Physical;Cognitive  Recommendation:  None      Equipment: n/a   Reasons for discharge: Treatment goals met   Patient/Family Agrees with Progress Made and  Goals Achieved: Yes    Blakelyn Dinges M.A., CCC-SLP 01/19/2024, 12:12 PM

## 2024-01-20 DIAGNOSIS — T384X5S Adverse effect of oral contraceptives, sequela: Secondary | ICD-10-CM | POA: Diagnosis not present

## 2024-01-20 DIAGNOSIS — G43709 Chronic migraine without aura, not intractable, without status migrainosus: Secondary | ICD-10-CM | POA: Diagnosis not present

## 2024-01-20 DIAGNOSIS — I6381 Other cerebral infarction due to occlusion or stenosis of small artery: Secondary | ICD-10-CM | POA: Diagnosis not present

## 2024-01-20 NOTE — Progress Notes (Signed)
 Occupational Therapy Weekly Progress Note  Patient Details  Name: Faith Jordan MRN: [id number] Date of Birth: 22-Mar-1996  Beginning of progress report period: January 13, 2024 End of progress report period: January 20, 2024  Today's Date: 01/20/2024 OT Individual Time: 4098-1191 OT Individual Time Calculation (min): 74 min  Session 2:  OT Individual Time: 4782-9562 OT Individual Time Calculation (min): 70 min    Patient has met 4 of 4 short term goals. Miss Napolitano is quickly progressing towards reaching her LTGs. She is completing U/LB bathing with CGA, UB dressing min A, LB dressing CGA, and toileting CGA. She is motivated to participate in therapy session and is demonstrating improved functional use of her L UE utilizing at a gross assist level with min A.   Patient continues to demonstrate the following deficits: muscle weakness and muscle joint tightness, decreased cardiorespiratoy endurance, unbalanced muscle activation, decreased coordination, and decreased motor planning, central origin, and decreased sitting balance, decreased standing balance, decreased postural control, hemiplegia, and decreased balance strategies and therefore will continue to benefit from skilled OT intervention to enhance overall performance with BADL, iADL, Vocation, and Reduce care partner burden.  Patient progressing toward long term goals..  Continue plan of care.  OT Short Term Goals Week 1:  OT Short Term Goal 1 (Week 1): Pt will utilize L UE as a gross assist during BADLs with min A OT Short Term Goal 1 - Progress (Week 1): Met OT Short Term Goal 2 (Week 1): Pt will complete toilet transfers CGA usign LRAD OT Short Term Goal 2 - Progress (Week 1): Met OT Short Term Goal 3 (Week 1): Pt will complete grooming/hygine tasks with set-up A using hemi-techniques PRN OT Short Term Goal 3 - Progress (Week 1): Met OT Short Term Goal 4 (Week 1): Pt will complete LB dressing using LRAD and hemi techniques PRN with  CGA OT Short Term Goal 4 - Progress (Week 1): Met Week 2:  OT Short Term Goal 1 (Week 2): STG=LTG d/t ELOS  Skilled Therapeutic Interventions/Progress Updates:     AM Session:  Pt received reclined in bed presenting to be in good spirits receptive to skilled OT session reporting 0/10 pain- OT offering intermittent rest breaks, repositioning, and therapeutic support to optimize participation in therapy session. Focus this session on L UE NMRE to improve overall functional use for BADLs, BADL retraining, and functional mobility training.   Mobility:  -Bed Mobility: Supine <> EOB supervision using bed rail  -Toilet transfer: Pt competed ambulatory transfer to bathroom no AD CGA and transferred to toilet CGA using grab bar -Functional Mobility: Engaged Pt in completing functional mobility <> therapy gym no AD for endurance and mobility training with Pt able to complete > ~150 ft with CGA provided for balance +increased time d/t slowed gait speed- Pt with improved L LE activation in stance phase, however continued to present with some hyperextension.   BADLs:  -Pt completed 3/3 toileting tasks on standard toilet no AD with CGA without LOB +increased time. Pt with improved utilization of L UE during task to donn/doff pants -Pt completed grooming/hygiene tasks standing at sink including brushing teeth and applying lotion with CGA provided for balance. Pt able to utilize L UE as a gross assist with CGA with min verbal cues.   NMRE:  -Sitting EOM with L UE positioned on large ball, engaged Pt in completing NMRE exercises with emphasis on decreasing compensatory body movements. Pt completed scapular protraction/retraction and shoulder abduction/adduction 2x10 reps with  consistent verbal and tactile cues provided for muscle activation and technique. Pt the complete isolated pectoral engagement squeezing large ball and shoulder flexion with B UE positioned on opposite sides of large ball with OT providing  consistent verbal/tactile cues for postural alignment, scapular rotation, and shoulder depression +min A.  -Sitting EOM, engaged Pt in completing L UE NMRE moving through functional movement patterns with OT providing gentle resistance for improved NMRE with Pt completing 1x8 reps of grasp/release, wrist extension/flexion, supination/pronation with maintained grasp, elbow flexion/extension, internal external/rotation, and shoulder flexion. Graded up challenge of exercises to combine movement patterns with Pt instructed to grasp wash cloth, bring it beside her head, and then throw it at target completing total of 8 reps- Pt able to maintain grasp and voluntarily release wash cloth with max effort, increased challenges noted to fully extend elbow during activity -Sitting EOM, donned e-stim onto Pt's dorsal forearm and triceps to facilitate wrist/digit extension and elbow extension. During "off" phase of e-stim,Pt instructed to retrieve horse shoes from OT on her L side to facilitate external shoulder rotation, cylindric grasp, and elbow flexion, during "on" phase Pt instructed to flex shoulder to donn horse shoe over horizontal bar. Pt able to complete activities with mod verbal and tactile cues to decrease shoulder elevation, for postural alignment, and to decrease compensatory trunk flexion with intermittent rest breaks. Doffed e-stim at end of session with no redness or irritation noted. Pt with improved wrist and digit flexion this session 3/5 when giving maximal effort  -In supine position, donned e-stim onto Pt's L tricep and anterior deltoid to facilitate shoulder flexion in combination with elbow extension. Engaged Pt completing functional movement patterns of anterior reaching and shoulder flexion to 90 degree with sustained elbow extension during on phase of e-stim with Pt providing tactile cues to min A for control. Pt then complete B UE movements while holding 1# weighted dowel completing chest press,  bicep curls, and B UE shoulder flexion with CGA when giving maximal effort while OT provided consistent verbal and tactile cues for muscle engagement and to avoid compensatory movement patterns. Pt with improved shoulder stability and shoulder activation this session completing 90~ shoulder flexion independently in gravity eliminated plane. No redness or irritation noted when e-stim was removed.   Pt was left resting EOB with call bell in reach, bed alarm on, and all needs met.     PM Session:  Pt received sitting up in bed with friends and father present in room. Pt presenting to be in good spirits receptive to skilled OT session reporting 0/10 pain- OT offering intermittent rest breaks, repositioning, and therapeutic support to optimize participation in therapy session. Focus this session on L UE NMRE, dual tasking, and dynamic balance.   Mobility:  -Engaged Pt in completing functional mobility <> therapy gym no AD for activity tolerance. Pt requiring CGA +increased time with mod verbal cues for gait speed.   MNRE:  -Engaged Pt in dynamic standing balance L UE NMRE task at elevated table. With L hand positioned on washcloth, engaged Pt in completing scapular protraction/retraction, scapular circumduction clockwise/counter clockwise, isolated elbow flexion/extension, and shoulder abduction/adduction with WB'ing facilitated into L UE during task. Consistent verbal and tactile cues provided to avoid compensatory trunk flexion or shoulder elevation. Pt with improved tricep extension noted this session 3+/5.  -Pt completed dynamic balance dual tasking functional reaching activity at table top with Pt tasked with utilizing L UE to retrieve cones to stack them on opposite side of table  with able to complete 8 reps x2 trials with min A provided at elbow and tactile cues provided at scapula to decrease compensatory shoulder elevation. Between and following trials, engaged Pt in utilizing B UE to separate cones  and then replace them on table top- Pt able to maintain grasp during task without dropping and mod verbal cues for thumb positioning and to facilitate increase wrist extension.  -While seated at table top, engaged Pt in functional reaching with horse shoes to facilitate cylindric grasp, crossing midline, and reaching to ~90 degrees. Pt task with rotating L shoulder externally to retrieve horse shoes from OT and then don them onto horizontal board positioned on Pt's R side. Pt completed total of 8 reps and then reversed task with mod verbal/tactile cues and intermittent min A provided to avoid compensatory trunk movements and shoulder elevation.  -Pt participated in yoga-based exercises for NMRE to L hemibody to increase body awareness, proximal strength, L U/LE motor control, balance, and self-efficacy in completing BADLs and functional transfers. Began session in prone position and progressed to table top to facilitate WB'ing through L L/UEs, work on L UE activation in closed chain position, and engage Pt in gentle stretches neck and back musculature tightness. Engaged Pt in completing child's pose, cat/cows, threading the needle, and alternating bird dogs with OT providing tactile and verbal cues for muscle engagement and technique to decrease compensatory trunk and shoulder movements. Pt then transitioned to seated position and completed AAROM exercises of L UE with chest/side body openers, trunk rotation, and functional reaching incorporated into tasks. Mod verbal cues required to utilize deep breathing techniques during movements.    Pt was left resting in bed with call bell in reach, bed alarm on, and all needs met.    Therapy Documentation Precautions:  Precautions Precautions: Fall Precaution/Restrictions Comments: L hemipareisis Restrictions Weight Bearing Restrictions Per Provider Order: No   Therapy/Group: Individual Therapy  Clide Deutscher 01/20/2024, 7:50 AM

## 2024-01-20 NOTE — Progress Notes (Signed)
 PROGRESS NOTE   Subjective/Complaints:  Appreciate Neurology and Neuropsych assist Discussed importance of staying of OC post d/c.  No HA , currently only on TOpamax.   Discussed that I do not expect 100% recovery of LUE, discussed that fine motor  issues likely to persist to some degree.  ROS- see HPI. neg CP, SOB, abd pain, N/V/D  Objective:   No results found. No results for input(s): "WBC", "HGB", "HCT", "PLT" in the last 72 hours.  Recent Labs    01/18/24 0525  NA 140  K 3.4*  CL 109  CO2 21*  GLUCOSE 88  BUN 8  CREATININE 0.75  CALCIUM 8.9    Intake/Output Summary (Last 24 hours) at 01/20/2024 0737 Last data filed at 01/20/2024 0700 Gross per 24 hour  Intake 236 ml  Output --  Net 236 ml        Physical Exam: Vital Signs Blood pressure 92/61, pulse (!) 59, temperature 99.7 F (37.6 C), resp. rate 16, height 5\' 7"  (1.702 m), weight 75 kg, SpO2 99%.   General: No acute distress, resting in bed comfortably.  Mood and affect are appropriate Heart: Regular rate and rhythm no rubs murmurs or extra sounds Lungs: Clear to auscultation, breathing unlabored, no rales or wheezes Abdomen: Positive bowel sounds, soft nontender to palpation, nondistended Extremities: No clubbing, cyanosis, or edema Skin: No evidence of breakdown, no evidence of rash over exposed surfaces.  [name]: L arm weakness, L leg weakness Neuro: A&O x3  PRIOR EXAMS: Neurologic: Cranial nerves II through XII intact, motor strength is 5/5 in right and 3-/5 left deltoid, bicep, tricep, grip, 5/5 RIght and 4- left hip flexor, knee extensors, 5/5 right and 3- left ankle dorsiflexor and plantar flexor  Sensory exam normal sensation to light touch  in bilateral upper and lower extremities Cerebellar exam limited by weakness LUE  Musculoskeletal: Full passive range of motion in all 4 extremities. No joint swelling 1FB subluxation ,     Assessment/Plan: 1. Functional deficits which require 3+ hours per day of interdisciplinary therapy in a comprehensive inpatient rehab setting. Physiatrist is providing close team supervision and 24 hour management of active medical problems listed below. Physiatrist and rehab team continue to assess barriers to discharge/monitor patient progress toward functional and medical goals  Care Tool:  Bathing    Body parts bathed by patient: Left arm, Chest, Abdomen, Front perineal area, Buttocks, Right upper leg, Left upper leg, Face, Right lower leg, Left lower leg   Body parts bathed by helper: Right arm, Right lower leg, Left lower leg     Bathing assist Assist Level: Minimal Assistance - Patient > 75%     Upper Body Dressing/Undressing Upper body dressing   What is the patient wearing?: Pull over shirt    Upper body assist Assist Level: Minimal Assistance - Patient > 75%    Lower Body Dressing/Undressing Lower body dressing      What is the patient wearing?: Pants     Lower body assist Assist for lower body dressing: Minimal Assistance - Patient > 75%     Toileting Toileting    Toileting assist Assist for toileting: Minimal Assistance - Patient >  75%     Transfers Chair/bed transfer  Transfers assist     Chair/bed transfer assist level: Moderate Assistance - Patient 50 - 74%     Locomotion Ambulation   Ambulation assist      Assist level: Minimal Assistance - Patient > 75% Assistive device: No Device Max distance: 250 ft   Walk 10 feet activity   Assist     Assist level: Minimal Assistance - Patient > 75% Assistive device: No Device   Walk 50 feet activity   Assist    Assist level: Minimal Assistance - Patient > 75% Assistive device: No Device    Walk 150 feet activity   Assist    Assist level: Minimal Assistance - Patient > 75% Assistive device: No Device    Walk 10 feet on uneven surface  activity   Assist Walk 10 feet  on uneven surfaces activity did not occur: Safety/medical concerns         Wheelchair     Assist Is the patient using a wheelchair?: No Type of Wheelchair: Manual Wheelchair activity did not occur: Refused         Wheelchair 50 feet with 2 turns activity    Assist    Wheelchair 50 feet with 2 turns activity did not occur: Refused       Wheelchair 150 feet activity     Assist  Wheelchair 150 feet activity did not occur: Refused       Blood pressure 92/61, pulse (!) 59, temperature 99.7 F (37.6 C), resp. rate 16, height 5\' 7"  (1.702 m), weight 75 kg, SpO2 99%.  Medical Problem List and Plan: 1. 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             -patient may  shower             -ELOS/Goals: 4/12 days- supervision- Mod I             -Continue CIR Do not anticipate RTW for several months 2.  Antithrombotics: -DVT/anticoagulation:  Pharmaceutical: Lovenox 40mg  daily -antiplatelet therapy: Plavix 75 mg daily.  Patient with allergy to NSAIDs thus was not placed on aspirin 3. Pain Management: Lido Derm patch 4. Mood/Behavior/Sleep: Prozac 40 mg daily, melatonin 5 mg nightly, Ativan 1 mg every 6 hours as needed anxiety, trazodone 100mg  nightly             -antipsychotic agents: N/A 5. Neuropsych/cognition: This patient is capable of making decisions on her own behalf. 6. Skin/Wound Care: Routine skin check 7. Fluids/Electrolytes/Nutrition: Continue sodium bicarb 650mg  TID. Routine in and outs with follow-up chemistries  Pt states she has always had low CO2 levels , did not take bicarb at home, now has low K+ no diuretics, suspect due to bicarb supplementation - d/c bicarb , give 1x dose of KCL and monitor     Latest Ref Rng & Units 01/18/2024    5:25 AM 01/16/2024    5:27  AM 01/14/2024    5:34 AM  BMP  Glucose 70 - 99 mg/dL 88  92    BUN 6 - 20 mg/dL 8  6    Creatinine 1.61 - 1.00 mg/dL 0.96  0.45    Sodium 409 - 145 mmol/L 140  142    Potassium 3.5 - 5.1 mmol/L 3.4  3.4  3.7  Chloride 98 - 111 mmol/L 109  109    CO2 22 - 32 mmol/L 21  22    Calcium 8.9 - 10.3 mg/dL 8.9  9.1     Recheck still low will need daily KCL recheck 4/7 8.  Current History of hemiplegic migraines.  Followed by neurology service Dr. Lucia Gaskins.  Continue Topamax 50mg  daily +50mg  daily PRN- per pt, took off Emgality (which is due ~3/28) as well as Nurtec- No HAs since rehab admission May use prn fioricet 9.  Hyperlipidemia.  Lipitor 80mg  daily 10. 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 Bowel management: continue fibercon and acidophilus, imodium 1mg  q6h PRN, miralax daily PRN Improved   LOS: 8 days A FACE TO FACE EVALUATION WAS PERFORMED  Erick Colace 01/20/2024, 7:37 AM

## 2024-01-20 NOTE — Plan of Care (Signed)
  Problem: Consults Goal: RH STROKE PATIENT EDUCATION Description: See Patient Education module for education specifics  Outcome: Progressing   Problem: RH SAFETY Goal: RH STG ADHERE TO SAFETY PRECAUTIONS W/ASSISTANCE/DEVICE Description: STG Adhere to Safety Precautions With cues Assistance/Device. Outcome: Progressing   Problem: RH PAIN MANAGEMENT Goal: RH STG PAIN MANAGED AT OR BELOW PT'S PAIN GOAL Description: < 4 with prns Outcome: Progressing   Problem: RH KNOWLEDGE DEFICIT Goal: RH STG INCREASE KNOWLEGDE OF HYPERLIPIDEMIA Description: Patient and family will be able to manage HLD using educational resources for medications and dietary modifications independently Outcome: Progressing Goal: RH STG INCREASE KNOWLEDGE OF STROKE PROPHYLAXIS Description: Patient and family will be able to manage secondary stroke risks using educational resources for medications and dietary modifications independently Outcome: Progressing

## 2024-01-20 NOTE — Progress Notes (Incomplete)
 Physical Therapy Session Note  Patient Details  Name: SHIZA THELEN MRN: [id number] Date of Birth: Mar 23, 1996  Today's Date: 01/20/2024 PT Individual Time: 0931-1015 PT Individual Time Calculation (min): 44 min   Short Term Goals: Week 1:  PT Short Term Goal 1 (Week 1): Pt will perform standing transfers with decreased impulsivity and overall CGA. PT Short Term Goal 2 (Week 1): Pt will ambulate community distances with overall CGA/ supervision and no LLE hyperextension. PT Short Term Goal 3 (Week 1): Pt will perform stairs with 1HR and overall supervision. PT Short Term Goal 4 (Week 1): Pt will improve Berg Balance score by at least 5 points.  Skilled Therapeutic Interventions/Progress Updates:      Therapy Documentation Precautions:  Precautions Precautions: Fall Precaution/Restrictions Comments: L hemipareisis Restrictions Weight Bearing Restrictions Per Provider Order: No General:   Vital Signs:   Pain:   Mobility:   Locomotion :    Trunk/Postural Assessment :    Balance:   Exercises:   Other Treatments:      Therapy/Group: Individual Therapy  Loel Dubonnet PT, DPT, CSRS 01/20/2024, 12:53 PM

## 2024-01-20 NOTE — Progress Notes (Signed)
 Physical Therapy Session Note  Patient Details  Name: Faith Jordan MRN: [id number] Date of Birth: 1996/09/23  Today's Date: 01/20/2024 PT Individual Time: 1116-1200 PT Individual Time Calculation (min): 44 min   Short Term Goals: Week 1:  PT Short Term Goal 1 (Week 1): Pt will perform standing transfers with decreased impulsivity and overall CGA. PT Short Term Goal 2 (Week 1): Pt will ambulate community distances with overall CGA/ supervision and no LLE hyperextension. PT Short Term Goal 3 (Week 1): Pt will perform stairs with 1HR and overall supervision. PT Short Term Goal 4 (Week 1): Pt will improve Berg Balance score by at least 5 points.  Skilled Therapeutic Interventions/Progress Updates:  Patient supine in bed on entrance to room. Patient alert and agreeable to PT session.   Patient with no pain complaint at start of session.  Therapeutic Activity: Bed Mobility: Pt performed supine <> sit with Mod I.  Transfers: Pt performed sit<>stand and stand pivot transfers throughout session with more distant supervision. No cueing provided for technique as pt is able to perform with no UE use.   Gait Training:  Pt ambulated 170' x2/ 250' x1 with no AD and supervision. Demonstrated good focus on knee stability throughout bouts with minimal but continued intermittent performance. Pt also demos decrease step height with LLE and quick catch of toe or skid of foot on floor. Provided vc/ tc for maintaining focus and attempt to increase lateral weight shift to L for improved time on LLE and improving quality of gait. Also for increased hip and knee flexion for foot clearance.   Neuromuscular Re-ed: NMR facilitated during session with focus on motor control. Pt guided in use of NuStep L5 x53min with BLE only to increase resistance to LLE push into extension and lift into increased flexion. Demos IR/ adduction of L knee to midline. Pt cued to maintain hip knee and 2nd toe alignment in order to prevent  IR/adduction and improve quad strength/ motor control. With focus, pt is able to control. Also, pt is able to improve in smooth, controlled movements without allowing pedals to bottom out which demos increased coordination reciprocally.  In last 2 min of Nustep, pt guided in use of BUE only on L1 in order to improve motor control of LUE and hand. Intermittent vc required to maintain grasp with L hand. In last minute, pt uses LUE only with support to arm provided by therapist. Is able to initiate in both directions with increased vc for effort and improves in initiation plus force and distance of movement.   NMR performed for improvements in motor control and coordination, balance, sequencing, judgement, and self confidence/ efficacy in performing all aspects of mobility at highest level of independence.   Toilet transfer at end of session with IND.   Patient supine in bed at end of session with brakes locked, bed alarm set, and all needs within reach.   Therapy Documentation Precautions:  Precautions Precautions: Fall Precaution/Restrictions Comments: L hemipareisis Restrictions Weight Bearing Restrictions Per Provider Order: No Pain:  No pain related this session.  Therapy/Group: Individual Therapy  Loel Dubonnet PT, DPT, CSRS 01/20/2024, 12:54 PM

## 2024-01-21 DIAGNOSIS — K5901 Slow transit constipation: Secondary | ICD-10-CM

## 2024-01-21 DIAGNOSIS — I6381 Other cerebral infarction due to occlusion or stenosis of small artery: Secondary | ICD-10-CM | POA: Diagnosis not present

## 2024-01-21 NOTE — Progress Notes (Addendum)
 Physical Therapy Weekly Progress Note  Patient Details  Name: Faith Jordan MRN: [id number] Date of Birth: 11-22-1995  Beginning of progress report period: January 13, 2024 End of progress report period: January 21, 2024  Today's Date: 01/21/2024  Patient has met 3 of 4 short term goals.  Pt making appropriate progress towards goals and is on track to meet LTG. She has not missed any therapy and is very motivated to participate as pt wants to be able to return to work one day. She completes bed mobility with Mod I and performs standing transfers with supervision.  She's ambulating community distances with no AD and close supervision/ CGA intermittently for imbalances/ missteps, and intermittent uncontrolled L knee hyperextension. She has also shown ability to navigate twelve 6-in steps with close supervision using Bil handrails. She continues to be primarily limited by decreased dynamic standing balance, LUE and LLE weakness, and gait impairments. Caregiver/ family have not yet participated in formal family education to prepare for d/c home - but expect them to train this coming week.   Patient continues to demonstrate the following deficits muscle weakness, impaired timing and sequencing and unbalanced muscle activation, decreased motor planning, and hemiplegia and decreased balance strategies and therefore will continue to benefit from skilled PT intervention to increase functional independence with mobility.  Patient progressing toward long term goals..  Continue plan of care.  PT Short Term Goals Week 1:  PT Short Term Goal 1 (Week 1): Pt will perform standing transfers with decreased impulsivity and overall CGA. PT Short Term Goal 1 - Progress (Week 1): Met PT Short Term Goal 2 (Week 1): Pt will ambulate community distances with overall CGA/ supervision and no LLE hyperextension. PT Short Term Goal 2 - Progress (Week 1): Progressing toward goal PT Short Term Goal 3 (Week 1): Pt will perform  stairs with 1HR and overall supervision. PT Short Term Goal 3 - Progress (Week 1): Met PT Short Term Goal 4 (Week 1): Pt will improve Berg Balance score by at least 5 points. PT Short Term Goal 4 - Progress (Week 1): Met Week 2:  PT Short Term Goal 1 (Week 2): STG = LTG d/t ELOS  Skilled Therapeutic Interventions/Progress Updates:  Ambulation/gait training;Cognitive remediation/compensation;Discharge planning;DME/adaptive equipment instruction;Functional mobility training;Pain management;Psychosocial support;Splinting/orthotics;Therapeutic Activities;UE/LE Strength taining/ROM;Visual/perceptual remediation/compensation;UE/LE Runner, broadcasting/film/video;Therapeutic Exercise;Patient/family education;Neuromuscular re-education;Functional electrical stimulation;Disease management/prevention;Community reintegration;Balance/vestibular training   Patient demonstrates increased but improving fall risk as noted by score of  42 /56 on Berg Balance Scale.  (<36= high risk for falls, close to 100%; 37-45 significant >80%; 46-51 moderate >50%; 52-55 lower >25%). This is a significant improvement over completion of test one week ago.   Therapy Documentation Precautions:  Precautions Precautions: Fall Precaution/Restrictions Comments: L hemipareisis Restrictions Weight Bearing Restrictions Per Provider Order: No  Pain: Pain Assessment Pain Scale: 0-10 Pain Score: 0-No pain related in general by pt.   Therapy/Group: Individual Therapy  Loel Dubonnet PT, DPT, CSRS 01/21/2024, 6:42 AM

## 2024-01-21 NOTE — Progress Notes (Signed)
 Physical Therapy Session Note  Patient Details  Name: Faith Jordan MRN: [id number] Date of Birth: [birth year]  Today's Date: 01/21/2024 PT Individual Time: 1132-1230 PT Individual Time Calculation (min): 58 min   Short Term Goals: Week 1:  PT Short Term Goal 1 (Week 1): Pt will perform standing transfers with decreased impulsivity and overall CGA. PT Short Term Goal 2 (Week 1): Pt will ambulate community distances with overall CGA/ supervision and no LLE hyperextension. PT Short Term Goal 3 (Week 1): Pt will perform stairs with 1HR and overall supervision. PT Short Term Goal 4 (Week 1): Pt will improve Berg Balance score by at least 5 points.   Skilled Therapeutic Interventions/Progress Updates:  Patient supine in bed on entrance to room. Patient alert and agreeable to PT session.   Patient with no pain complaint at start of session. Dons L arm sling with MinA.   Therapeutic Activity: Bed Mobility: Pt performed supine <> sit with IND. Dons shoes with Mod I. No cueing required for safety or technique. Transfers: Pt performed sit<>stand and stand pivot transfers throughout session with improving technique and good control in ascent and descent. No cues provided for technique.  Gait Training:  Pt ambulated >250 ft using no AD with supervision. Demonstrated continued L knee flexion during stance phase and intermittent uncontrolled hyperextension. Pt also demonstrating attempt for increased lateral pelvic translation to L hip during stance phase in order to normalize gait pattern and increase time spent on LLE. Provided vc/ tc for controlling knee into soft extension and can demo until terminal stance when knee hyperextends.   Gait training continued for community distance challenge as well to assess and improve quality of gait. Pt is able to enter/ exit elevator, ambulate across atrium, out of entrance at [facility], across paved patio, around decorative water fountain, down circular driveway, up/  down/ up steps, up driveway and then back inside and up to unit. Covers 1,750 feet without seated rest break and close supervision. Steps completed with no AD, initially step-to gait pattern, step-to pattern to descend and then, with focus, reciprocal gait pattern to ascend again. Continued vc for normalizing gait pattern as outlined above. Pt relates fatigue at end of bout.   Neuromuscular Re-ed: NMR facilitated during session with focus on standing balance, motor control, proprioception, UE NMR. Pt demos ability to move thumb in opposition at start of session. Requested pt to attempt thumb opposition to each fingertip and pt is able to perform gently to her excitement!! Pt guided in collection of ping pong balls and demos improved ability to hold ball in L hand with pinch grip/ grasp and carry across therapy gym. Is able to place on top of small cone and then carry cone from stopping point and then place on top of matching color at other end of therapy gym. Does well with each ball x4. Then on final trip pt is able to hodl 2 ping pong balls in L hand, place to top of cone, then able to grasp one cone in each hand and carry to matching table. Performs final placement with reduce trunkal compensation. Limited radial, ulnar deviation with control, but challenged pt to continue to work on focused, conscious practice over next week and should see improvement.    NMR performed for improvements in motor control and coordination, balance, sequencing, judgement, and self confidence/ efficacy in performing all aspects of mobility at highest level of independence.   Patient supine in bed at end of session with brakes locked,  bed alarm set, and all needs within reach.   Therapy Documentation Precautions:  Precautions Precautions: Fall Precaution/Restrictions Comments: L hemipareisis Restrictions Weight Bearing Restrictions Per Provider Order: No Pain: Pain Assessment Pain Scale: 0-10 Pain Score: 0-No pain  related this session.   Therapy/Group: Individual Therapy  Loel Dubonnet PT, DPT, CSRS 01/21/2024, 12:42 PM

## 2024-01-21 NOTE — Progress Notes (Signed)
 PROGRESS NOTE   Subjective/Complaints:  Pt doing well, slept well, denies pain, LBM 2 days ago which isn't concerning to her, urinating fine, denies any other complaints or concerns today.   ROS- see HPI. neg CP, SOB, abd pain, N/V/D  Objective:   No results found. No results for input(s): "WBC", "HGB", "HCT", "PLT" in the last 72 hours.  No results for input(s): "NA", "K", "CL", "CO2", "GLUCOSE", "BUN", "CREATININE", "CALCIUM" in the last 72 hours.   Intake/Output Summary (Last 24 hours) at 01/21/2024 1215 Last data filed at 01/20/2024 2231 Gross per 24 hour  Intake 120 ml  Output --  Net 120 ml        Physical Exam: Vital Signs Blood pressure (!) 104/59, pulse 62, temperature 97.8 F (36.6 C), temperature source Oral, resp. rate 16, height 5\' 7"  (1.702 m), weight 75 kg, SpO2 99%.   General: No acute distress, resting in bed comfortably.  Mood and affect are appropriate Heart: Regular rate and rhythm no rubs murmurs or extra sounds Lungs: Clear to auscultation, breathing unlabored, no rales or wheezes Abdomen: Positive bowel sounds, soft nontender to palpation, nondistended Extremities: No clubbing, cyanosis, or edema Skin: No evidence of breakdown, no evidence of rash over exposed surfaces.  [name]: L arm weakness, L leg weakness- stable Neuro: A&O x3  PRIOR EXAMS: Neurologic: Cranial nerves II through XII intact, motor strength is 5/5 in right and 3-/5 left deltoid, bicep, tricep, grip, 5/5 RIght and 4- left hip flexor, knee extensors, 5/5 right and 3- left ankle dorsiflexor and plantar flexor  Sensory exam normal sensation to light touch  in bilateral upper and lower extremities Cerebellar exam limited by weakness LUE  Musculoskeletal: Full passive range of motion in all 4 extremities. No joint swelling 1FB subluxation ,    Assessment/Plan: 1. Functional deficits which require 3+ hours per day of  interdisciplinary therapy in a comprehensive inpatient rehab setting. Physiatrist is providing close team supervision and 24 hour management of active medical problems listed below. Physiatrist and rehab team continue to assess barriers to discharge/monitor patient progress toward functional and medical goals  Care Tool:  Bathing    Body parts bathed by patient: Left arm, Chest, Abdomen, Front perineal area, Buttocks, Right upper leg, Left upper leg, Face, Right lower leg, Left lower leg   Body parts bathed by helper: Right arm, Right lower leg, Left lower leg     Bathing assist Assist Level: Minimal Assistance - Patient > 75%     Upper Body Dressing/Undressing Upper body dressing   What is the patient wearing?: Pull over shirt    Upper body assist Assist Level: Minimal Assistance - Patient > 75%    Lower Body Dressing/Undressing Lower body dressing      What is the patient wearing?: Pants     Lower body assist Assist for lower body dressing: Minimal Assistance - Patient > 75%     Toileting Toileting    Toileting assist Assist for toileting: Minimal Assistance - Patient > 75%     Transfers Chair/bed transfer  Transfers assist     Chair/bed transfer assist level: Moderate Assistance - Patient 50 - 74%  Locomotion Ambulation   Ambulation assist      Assist level: Minimal Assistance - Patient > 75% Assistive device: No Device Max distance: 250 ft   Walk 10 feet activity   Assist     Assist level: Minimal Assistance - Patient > 75% Assistive device: No Device   Walk 50 feet activity   Assist    Assist level: Minimal Assistance - Patient > 75% Assistive device: No Device    Walk 150 feet activity   Assist    Assist level: Minimal Assistance - Patient > 75% Assistive device: No Device    Walk 10 feet on uneven surface  activity   Assist Walk 10 feet on uneven surfaces activity did not occur: Safety/medical concerns          Wheelchair     Assist Is the patient using a wheelchair?: No Type of Wheelchair: Manual Wheelchair activity did not occur: Refused         Wheelchair 50 feet with 2 turns activity    Assist    Wheelchair 50 feet with 2 turns activity did not occur: Refused       Wheelchair 150 feet activity     Assist  Wheelchair 150 feet activity did not occur: Refused       Blood pressure (!) 104/59, pulse 62, temperature 97.8 F (36.6 C), temperature source Oral, resp. rate 16, height 5\' 7"  (1.702 m), weight 75 kg, SpO2 99%.  Medical Problem List and Plan: 1. 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             -patient may  shower             -ELOS/Goals: 4/12 days- supervision- Mod I             -Continue CIR Do not anticipate RTW for several months 2.  Antithrombotics: -DVT/anticoagulation:  Pharmaceutical: Lovenox 40mg  daily -antiplatelet therapy: Plavix 75 mg daily.  Patient with allergy to NSAIDs thus was not placed on aspirin 3. Pain Management: Lido Derm patch 4. Mood/Behavior/Sleep: Prozac 40 mg daily, melatonin 5 mg nightly, Ativan 1 mg every 6 hours as needed anxiety, trazodone 100mg  nightly             -antipsychotic agents: N/A 5. Neuropsych/cognition: This patient is capable of making decisions on her own behalf. 6. Skin/Wound Care: Routine skin check 7. Fluids/Electrolytes/Nutrition: Continue sodium bicarb 650mg  TID. Routine in and outs with follow-up chemistries Pt states she has always had low CO2 levels , did not take bicarb at home, now has low K+ no diuretics, suspect due to bicarb supplementation - d/c bicarb , give 1x dose of KCL and monitor   Recheck still low will need daily KCL recheck 4/7    Latest Ref Rng & Units 01/18/2024    5:25 AM 01/16/2024     5:27 AM 01/14/2024    5:34 AM  BMP  Glucose 70 - 99 mg/dL 88  92    BUN 6 - 20 mg/dL 8  6    Creatinine 1.61 - 1.00 mg/dL 0.96  0.45    Sodium 409 - 145 mmol/L 140  142    Potassium 3.5 - 5.1 mmol/L 3.4  3.4  3.7   Chloride 98 - 111 mmol/L 109  109    CO2 22 - 32 mmol/L 21  22    Calcium 8.9 - 10.3 mg/dL 8.9  9.1     8.  Current History of hemiplegic migraines.  Followed by neurology service Dr. Lucia Gaskins.  Continue Topamax 50mg  daily +50mg  daily PRN- per pt, took off Emgality (which is due ~3/28) as well as Nurtec- No HAs since rehab admission May use prn fioricet 9.  Hyperlipidemia.  Lipitor 80mg  daily 10. 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 Bowel management: continue fibercon and acidophilus, imodium 1mg  q6h PRN, miralax daily PRN -01/21/24 LBM 2 days ago, monitor closely but no change in meds today  LOS: 9 days A FACE TO FACE EVALUATION WAS PERFORMED  [address] 01/21/2024, 12:15 PM

## 2024-01-22 DIAGNOSIS — I6381 Other cerebral infarction due to occlusion or stenosis of small artery: Secondary | ICD-10-CM | POA: Diagnosis not present

## 2024-01-22 DIAGNOSIS — K5901 Slow transit constipation: Secondary | ICD-10-CM | POA: Diagnosis not present

## 2024-01-22 MED ORDER — DOCUSATE SODIUM 100 MG PO CAPS: 100.0000 mg | ORAL_CAPSULE | Freq: Every day | ORAL | Status: DC | PRN

## 2024-01-22 NOTE — Plan of Care (Signed)
  Problem: Consults Goal: RH STROKE PATIENT EDUCATION Description: See Patient Education module for education specifics  Outcome: Progressing   Problem: RH SAFETY Goal: RH STG ADHERE TO SAFETY PRECAUTIONS W/ASSISTANCE/DEVICE Description: STG Adhere to Safety Precautions With cues Assistance/Device. Outcome: Progressing   Problem: RH PAIN MANAGEMENT Goal: RH STG PAIN MANAGED AT OR BELOW PT'S PAIN GOAL Description: < 4 with prns Outcome: Progressing   Problem: RH KNOWLEDGE DEFICIT Goal: RH STG INCREASE KNOWLEGDE OF HYPERLIPIDEMIA Description: Patient and family will be able to manage HLD using educational resources for medications and dietary modifications independently Outcome: Adequate for Discharge Goal: RH STG INCREASE KNOWLEDGE OF STROKE PROPHYLAXIS Description: Patient and family will be able to manage secondary stroke risks using educational resources for medications and dietary modifications independently Outcome: Adequate for Discharge

## 2024-01-22 NOTE — Progress Notes (Signed)
 PROGRESS NOTE   Subjective/Complaints:  Pt doing well again today, slept well, denies pain, LBM 3 days ago but states that usually she'll drink some [employer] coffee and she'll have a BM-- family is bringing this today. Sometimes she'll use colace 1-2 tabs when she needs it, she's been getting 1 tab daily here. Agreeable with adding a PRN dose so she can ask for it if she needs additional. Urinating fine, denies any other complaints or concerns today.   ROS- see HPI. neg CP, SOB, abd pain, N/V/D  Objective:   No results found. No results for input(s): "WBC", "HGB", "HCT", "PLT" in the last 72 hours.  No results for input(s): "NA", "K", "CL", "CO2", "GLUCOSE", "BUN", "CREATININE", "CALCIUM" in the last 72 hours.   Intake/Output Summary (Last 24 hours) at 01/22/2024 1130 Last data filed at 01/21/2024 1317 Gross per 24 hour  Intake 355 ml  Output --  Net 355 ml        Physical Exam: Vital Signs Blood pressure (!) 91/55, pulse (!) 57, temperature 97.7 F (36.5 C), resp. rate 18, height 5\' 7"  (1.702 m), weight 75 kg, SpO2 100%.   General: No acute distress, resting in bed comfortably.  Mood and affect are appropriate, cheerful  Heart: Regular rate and rhythm no rubs murmurs or extra sounds Lungs: Clear to auscultation, breathing unlabored, no rales or wheezes Abdomen: Positive bowel sounds, soft nontender to palpation, nondistended Extremities: No clubbing, cyanosis, or edema Skin: No evidence of breakdown, no evidence of rash over exposed surfaces.  [name]: L arm weakness, L leg weakness- stable Neuro: A&O x3  PRIOR EXAMS: Neurologic: Cranial nerves II through XII intact, motor strength is 5/5 in right and 3-/5 left deltoid, bicep, tricep, grip, 5/5 RIght and 4- left hip flexor, knee extensors, 5/5 right and 3- left ankle dorsiflexor and plantar flexor  Sensory exam normal sensation to light touch  in bilateral upper and lower  extremities Cerebellar exam limited by weakness LUE  Musculoskeletal: Full passive range of motion in all 4 extremities. No joint swelling 1FB subluxation ,    Assessment/Plan: 1. Functional deficits which require 3+ hours per day of interdisciplinary therapy in a comprehensive inpatient rehab setting. Physiatrist is providing close team supervision and 24 hour management of active medical problems listed below. Physiatrist and rehab team continue to assess barriers to discharge/monitor patient progress toward functional and medical goals  Care Tool:  Bathing    Body parts bathed by patient: Left arm, Chest, Abdomen, Front perineal area, Buttocks, Right upper leg, Left upper leg, Face, Right lower leg, Left lower leg   Body parts bathed by helper: Right arm, Right lower leg, Left lower leg     Bathing assist Assist Level: Minimal Assistance - Patient > 75%     Upper Body Dressing/Undressing Upper body dressing   What is the patient wearing?: Pull over shirt    Upper body assist Assist Level: Minimal Assistance - Patient > 75%    Lower Body Dressing/Undressing Lower body dressing      What is the patient wearing?: Pants     Lower body assist Assist for lower body dressing: Minimal Assistance - Patient > 75%  Toileting Toileting    Toileting assist Assist for toileting: Minimal Assistance - Patient > 75%     Transfers Chair/bed transfer  Transfers assist     Chair/bed transfer assist level: Moderate Assistance - Patient 50 - 74%     Locomotion Ambulation   Ambulation assist      Assist level: Minimal Assistance - Patient > 75% Assistive device: No Device Max distance: 250 ft   Walk 10 feet activity   Assist     Assist level: Minimal Assistance - Patient > 75% Assistive device: No Device   Walk 50 feet activity   Assist    Assist level: Minimal Assistance - Patient > 75% Assistive device: No Device    Walk 150 feet  activity   Assist    Assist level: Minimal Assistance - Patient > 75% Assistive device: No Device    Walk 10 feet on uneven surface  activity   Assist Walk 10 feet on uneven surfaces activity did not occur: Safety/medical concerns         Wheelchair     Assist Is the patient using a wheelchair?: No Type of Wheelchair: Manual Wheelchair activity did not occur: Refused         Wheelchair 50 feet with 2 turns activity    Assist    Wheelchair 50 feet with 2 turns activity did not occur: Refused       Wheelchair 150 feet activity     Assist  Wheelchair 150 feet activity did not occur: Refused       Blood pressure (!) 91/55, pulse (!) 57, temperature 97.7 F (36.5 C), resp. rate 18, height 5\' 7"  (1.702 m), weight 75 kg, SpO2 100%.  Medical Problem List and Plan: 1. 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             -patient may  shower             -ELOS/Goals: 4/12 days- supervision- Mod I             -Continue CIR Do not anticipate RTW for several months 2.  Antithrombotics: -DVT/anticoagulation:  Pharmaceutical: Lovenox 40mg  daily -antiplatelet therapy: Plavix 75 mg daily.  Patient with allergy to NSAIDs thus was not placed on aspirin 3. Pain Management: Lido Derm patch 4. Mood/Behavior/Sleep: Prozac 40 mg daily, melatonin 5 mg nightly, Ativan 1 mg every 6 hours as needed anxiety, trazodone 100mg  nightly             -antipsychotic agents: N/A 5. Neuropsych/cognition: This patient is capable of making decisions on her own behalf. 6. Skin/Wound Care: Routine skin check 7. Fluids/Electrolytes/Nutrition: Continue sodium bicarb 650mg  TID. Routine in and outs with follow-up chemistries Pt states she has always had low CO2 levels , did not take bicarb at home,  now has low K+ no diuretics, suspect due to bicarb supplementation - d/c bicarb , give 1x dose of KCL and monitor   Recheck still low will need daily KCL recheck 4/7    Latest Ref Rng & Units 01/18/2024    5:25 AM 01/16/2024    5:27 AM 01/14/2024    5:34 AM  BMP  Glucose 70 - 99 mg/dL 88  92    BUN 6 - 20 mg/dL 8  6    Creatinine 1.61 - 1.00 mg/dL 0.96  0.45  Sodium 135 - 145 mmol/L 140  142    Potassium 3.5 - 5.1 mmol/L 3.4  3.4  3.7   Chloride 98 - 111 mmol/L 109  109    CO2 22 - 32 mmol/L 21  22    Calcium 8.9 - 10.3 mg/dL 8.9  9.1     8.  Current History of hemiplegic migraines.  Followed by neurology service Dr. Lucia Gaskins.  Continue Topamax 50mg  daily +50mg  daily PRN- per pt, took off Emgality (which is due ~3/28) as well as Nurtec- No HAs since rehab admission May use prn fioricet 9.  Hyperlipidemia.  Lipitor 80mg  daily 10. 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 Bowel management: continue fibercon and acidophilus, imodium 1mg  q6h PRN, miralax daily PRN -01/21/24 LBM 2 days ago, monitor closely but no change in meds today -01/22/24 still no BM, she's going to drink coffee which usually helps, added PRN dose of colace so she can ask for an additional dose if needed-- prefers this to miralax.   LOS: 10 days A FACE TO FACE EVALUATION WAS PERFORMED  [address] 01/22/2024, 11:30 AM

## 2024-01-22 NOTE — Plan of Care (Signed)
  Problem: RH SAFETY Goal: RH STG ADHERE TO SAFETY PRECAUTIONS W/ASSISTANCE/DEVICE Description: STG Adhere to Safety Precautions With cues Assistance/Device. Outcome: Progressing   Problem: RH PAIN MANAGEMENT Goal: RH STG PAIN MANAGED AT OR BELOW PT'S PAIN GOAL Description: < 4 with prns Outcome: Progressing   Problem: RH KNOWLEDGE DEFICIT Goal: RH STG INCREASE KNOWLEGDE OF HYPERLIPIDEMIA Description: Patient and family will be able to manage HLD using educational resources for medications and dietary modifications independently Outcome: Progressing

## 2024-01-23 DIAGNOSIS — I6381 Other cerebral infarction due to occlusion or stenosis of small artery: Secondary | ICD-10-CM | POA: Diagnosis not present

## 2024-01-23 DIAGNOSIS — G43709 Chronic migraine without aura, not intractable, without status migrainosus: Secondary | ICD-10-CM | POA: Diagnosis not present

## 2024-01-23 DIAGNOSIS — T384X5S Adverse effect of oral contraceptives, sequela: Secondary | ICD-10-CM | POA: Diagnosis not present

## 2024-01-23 LAB — BASIC METABOLIC PANEL WITH GFR
Anion gap: 8 (ref 5–15)
BUN: 7 mg/dL (ref 6–20)
CO2: 21 mmol/L — ABNORMAL LOW (ref 22–32)
Calcium: 9 mg/dL (ref 8.9–10.3)
Chloride: 111 mmol/L (ref 98–111)
Creatinine, Ser: 1.06 mg/dL — ABNORMAL HIGH (ref 0.44–1.00)
GFR, Estimated: >60 mL/min (ref >60)
Glucose, Bld: 91 mg/dL (ref 70–99)
Potassium: 3.3 mmol/L — ABNORMAL LOW (ref 3.5–5.1)
Sodium: 140 mmol/L (ref 135–145)

## 2024-01-23 LAB — CBC
HCT: 41.1 % (ref 36.0–46.0)
Hemoglobin: 13.9 g/dL (ref 12.0–15.0)
MCH: 28.7 pg (ref 26.0–34.0)
MCHC: 33.8 g/dL (ref 30.0–36.0)
MCV: 84.7 fL (ref 80.0–100.0)
Platelets: 313 10*3/uL (ref 150–400)
RBC: 4.85 MIL/uL (ref 3.87–5.11)
RDW: 12.9 % (ref 11.5–15.5)
WBC: 9 10*3/uL (ref 4.0–10.5)
nRBC: 0 % (ref 0.0–0.2)

## 2024-01-23 MED ORDER — POTASSIUM CHLORIDE CRYS ER 10 MEQ PO TBCR
10.0000 meq | EXTENDED_RELEASE_TABLET | Freq: Every day | ORAL | Status: DC
  Administered 2024-01-23 – 2024-01-28 (×6): 10 meq via ORAL
  Filled 2024-01-23 (×6): qty 1

## 2024-01-23 NOTE — Progress Notes (Signed)
 Occupational Therapy Session Note  Patient Details  Name: Faith Jordan MRN: [id number] Date of Birth: Jul 17, 1996  Today's Date: 01/23/2024 OT Individual Time: 4098-1191 OT Individual Time Calculation (min): 58 min  Session 2:  OT Individual Time: 1305-1400 OT Individual Time Calculation (min): 55 min   Short Term Goals: Week 2:  OT Short Term Goal 1 (Week 2): STG=LTG d/t ELOS  Skilled Therapeutic Interventions/Progress Updates:     AM Session:  Pt received sitting EOB, dressed and ready for the day upon OT arrival. pt presenting to be in good spirits receptive to skilled OT session reporting 0/10 pain- OT offering intermittent rest breaks, repositioning, and therapeutic support to optimize participation in therapy session. Pt reporting feeling increase in drowsiness/fatigue this AM, however motivated to participate in OT session. Focused this session on dual tasking, L UE NMRE, and Pt education.   Mobility: Pt completed functional mobility <> therapy gym no AD with close supervision with verbal cues required to increase pace.   NMRE: Engaged Pt in series of L UE NMRE activities to increase L UE functional use for BADLs, increase self-efficacy, and improve coordination. Pt tasked with completing connect-4 game utilizing L UE to retrieve game pieces from table top and place them into game slots in standing position for increased dual tasking challenge and to facilitate FM in-hand manipulation skills with emphasis on finger tip <> palm translation and pad to pad pinch. Pt able to complete activity with min dropping and decreased compensatory trunk flexion and shoulder elevation with min use of compensatory pinching technique. Pt then completed functional reaching task in seated position with Pt tasked with utilizing hand strengthener on level one setting with silver light resistance spring to pick up blocks and transport them to hallow cones using L UE. Pt able to complete 3x4 reps of activity  with mod verbal and tactile cues provided to decrease shoulder elevation with min dropping +increased time and rest breaks. To work on R UE shoulder control and coordination, Pt tasked with utilizing L UE to transport rings from one side of rainbow arch to opposite side of arch in standing position without allowing rings to touch arch. Pt able to complete 6 trials well with increased time and CGA provided for muscle activation and facilitate increase scapular rotation when lifting L UE.   Education:  Pt inquiring about returning to work, driving, and living alone during session with education provided on CVA recovery process, importance of staying safe at d/c, fall prevention, and safety considerations. Informed Pt to follow MD recommendations at follow-up OP appointments for time line to return to work and driving. Educated Pt that 24/7 supervision is reccommended initially at d/c and then slowly fading to decreased level of supervision and assistance required. Pt also reporting she wants to start taking care of her dog again- Pt owns a very large dog that puts her at higher risk for falls if I.e. taking dog on walk, god jumps on her when excited, etc. - recommended Pt not walk dog at this time and slowly re-integrate pet care into her daily routine with supervision.   Pt was left resting in bed with call bell in reach, and all needs met.    PM Session:  Pt received sitting up in bed, dressed and ready for the day politely declining need for BADLs this session. Pt presenting to be in good spirits receptive to skilled OT session reporting 0/10 pain- OT offering intermittent rest breaks, repositioning, and therapeutic support to optimize  participation in therapy session. Focused this session on IADL retraining, L UE NMRE, and dual tasking.   Mobility: Pt completed functional mobility <> therapy gym no AD with close supervision with verbal cues required to increase pace.   NMRE:  -Pt participated in  yoga-based exercises for NMRE to l hemibody to increase body awareness, proximal strength, L U/LE motor control, balance, and self-efficacy in completing BADLs and functional transfers. Began session in prone position and progressed to table top to facilitate WB'ing through L L/UEs, work on L UE activation in closed chain position, and engage Pt in gentle stretches chest, upper back, and neck tightness. Pt completed dynamic exercises including cat/cows, bird dogs, thread-the-needle, child's pose, and alternating elbows to knee. Pt then transitioned to seated position and completed AAROM exercises of L UE with chest/side body openers, trunk rotation, and neck lateral flexion/rotation and functional reaching incorporated into tasks. CGA-min A required for balance and consistent tactile and verbal cues provided for technique, safety, muscle engagement, and to avoid compensatory techniques. Min verbal cues required to utilize deep breathing techniques during movements.   -Engaged Pt in dynamic standing balance functional reaching activity with large vertical mirror placed anterior to Pt for visual feedback to support improved postural alignment and decrease compensatory trunk/shoulder movements. Pt tasked with retrieving squigz utilizing L UE and donning them onto mirror with focus on decreasing compensatory shoulder elevation and facilitating maintained grasp in combination with wrist extension. To facilitate increased radial deviation and targeted reach, Pt tasked with donning rings onto each squigz. Pt completed total of 8 reps of each with OT providing mod verbal/tactile cues for technique and to avoid compensatory body movements. Seated rest break provided and then Pt doffed rings/squigz to reverse the task with same level of assist required.   IADL Re-training:  -Sitting EOM, engaged Pt in folding large and small towels to facilitate B UE reciprocal arm movements within functional task and to increase Pt's  self efficacy in completing BADLs. Pt folded total of 6 large towels and 8 small towels with supervision with min compensatory shoulder elevation and compensatory pinching techniques noted. Pt with significantly improved pinch, wrist/digit extension, and maintained grasp noted this session. In between folding each towel, Pt completed sit <> stand to place towels in laundry basket. Engaged Pt in carrying laundry basket through busy gym using B UE close supervision and utilizing L UE to place towels into laundry bag with min dropping noted. Pt with improved balance and dual tasking skills during IADLs this session. Education provided on fall prevention and energy conservation techniques with pt receptive to education and verbalizing understanding.   Pt was left resting in bed with call bell in reach, bed alarm on, and all needs met.    Therapy Documentation Precautions:  Precautions Precautions: Fall Precaution/Restrictions Comments: L hemipareisis Restrictions Weight Bearing Restrictions Per Provider Order: No   Therapy/Group: Individual Therapy  Clide Deutscher 01/23/2024, 7:55 AM

## 2024-01-23 NOTE — Progress Notes (Incomplete)
 Physical Therapy Session Note  Patient Details  Name: Faith Jordan MRN: [id number] Date of Birth: [birth year]  Today's Date: 01/23/2024 PT Individual Time: 1117-1200 PT Individual Time Calculation (min): 43 min   Short Term Goals: Week 1:  PT Short Term Goal 1 (Week 1): Pt will perform standing transfers with decreased impulsivity and overall CGA. PT Short Term Goal 1 - Progress (Week 1): Met PT Short Term Goal 2 (Week 1): Pt will ambulate community distances with overall CGA/ supervision and no LLE hyperextension. PT Short Term Goal 2 - Progress (Week 1): Progressing toward goal PT Short Term Goal 3 (Week 1): Pt will perform stairs with 1HR and overall supervision. PT Short Term Goal 3 - Progress (Week 1): Met PT Short Term Goal 4 (Week 1): Pt will improve Berg Balance score by at least 5 points. PT Short Term Goal 4 - Progress (Week 1): Met Week 2:  PT Short Term Goal 1 (Week 2): STG = LTG d/t ELOS  Skilled Therapeutic Interventions/Progress Updates:  Patient supine in bed on entrance to room. Patient alert and agreeable to PT session.   Patient with minimal pain complaint at start of session. C/o discomfort previous day and overnight at posterior aspect of L knee.   Therapeutic Activity: Transfers: Pt performed sit<>stand and stand pivot transfers throughout session with Mod I requiring focus on technique. No cues required for technique but educated that L knee falls to midline toward R knee d/t decreased strength of quadriceps.  Gait Training:  Pt ambulated 250' x1 using Swedish knee cage to L knee d/t pt's minor pain complaint. Demos improved ability to control eccentric flexion/ ext with use. Ambulates with close supervision. Provided vc/ tc for continued focus to hold of L knee in soft but strong positioning in extension but not TKE. No knee cage used on return trip with improved pain and L knee control.   Neuromuscular Re-ed: NMR facilitated during session with focus on  balance, motor control, and proprioception. Pt guided in quadruped positioning into birddogs and into cat/ cow positioning for improved motor control of core and LUE/ LLE. In seated position, guided pt in upright pelvic rotations and L shoulder movements with hand to shoulder and direct overhead reach with AAROM. Also AAROM for shoulder abduction, straight arm shoulder flexion. Also guided in thumb opposition to each finger, and thumb circles in each direction.   Pt transitioned to NuStep and is able to demo ability to push/ pull with good grip of LUE only to L handle for on L1. Then transitioned to BUE and BLE use with focus on L hemibody use only. Pt is able to perform on L4 x72min.   NMR performed for improvements in motor control and coordination, balance, sequencing, judgement, and self confidence/ efficacy in performing all aspects of mobility at highest level of independence.   Pt initiated tub transfer and is able to reach bottom of tub. Then is unable to complete positioning body to complete rise from tub without Min/ ModA for initial power up. Pt relates that she will wait to take tub baths. Related to pt that PT, OT and pt can all brainstorm potential techniques in order to improve her ability to perform with supervision/ Mod I.   Patient supine in bed at end of session with brakes locked, no alarm set, and all needs within reach.   Therapy Documentation Precautions:  Precautions Precautions: Fall Precaution/Restrictions Comments: L hemipareisis Restrictions Weight Bearing Restrictions Per Provider Order: No  Pain:  Minimal discomfort noted in posterior L knee at start of session. Improved by end of session.   Therapy/Group: Individual Therapy  Loel Dubonnet PT, DPT, CSRS 01/23/2024, 9:10 AM

## 2024-01-23 NOTE — Progress Notes (Signed)
 Physical Therapy Session Note  Patient Details  Name: Faith Jordan MRN: [id number] Date of Birth: Aug 15, 1996  Today's Date: 01/23/2024 PT Individual Time: 0805-0900 PT Individual Time Calculation (min): 55 min   Short Term Goals: Week 2:  PT Short Term Goal 1 (Week 2): STG = LTG d/t ELOS  Skilled Therapeutic Interventions/Progress Updates:    Bed mobility independent to come to EOB. Pt performed transfer from bed, ambulation in to the bathroom with overall supervision and completed toileting with supervision/mod I without AD.  Oral hygiene at sink in standing with supervision for safety with balance and encouraged functional use of LUE in task. Gait to and from therapy gym x 150' with overall supervision with focus on normalizing gait pattern, control of LLE in stance (decrease hyperextension) and normalizing gait speed.   Focused on NMR to address fine motor and gross motor use of LUE in functional tasks, dynamic gait and standing balance, and dual task activities.  Located hidden resistive clothespins (yellow, red, and 1 green) around gym space at various heights with focus on patient using L hand to manipulate clothespins with support given at L arm to promote normal movement pattern for higher placed clothespins - gait at supervision level without device and pt carried pins in plastic bin with RUE Clothespins that were collected in plastic bin were then placed on finger ladder hooks (highest self selected reach to 3rd blue rung) with focus on fine motor manipulation and decreased compensatory strategies through shoulder and trunk (PT supported LUE in this manner) and then removed in same manner. Blocked practice sit <> stands while holding a tray (BUE support) with stacked cups to focus on balance and functional use and coordination in LUE x 10 reps with supervision and cues for technique Transitioned to gait while holding tray with stacked cups in forward 120', retro x 65', and lateral  sidestepping to R and L (each direction x 60') - cues for control in L knee to decrease hyperextension and during L lateral sidestepping cues to maintain trunk and hips forward vs rotating to the L - pt with stiff movements noted and very controlled and focused steps per report "so I don't fall" but no instability or LOB occurred - completed with close supervision to CGA. Stair negotiation with self selected pattern of step to and alternating x 12 steps with combo of no rails or occasional light 1 UE support during descent with overall CGA with focus on coordination and control, 1 mild L knee buckle on descent but pt able to self correct. Dynamic gait while kicking yoga block x 90' x 2 trials with focus on coordination and strength in LLE as well as stance control when kicking with R with overall supervision to CGA for balance.  Pt does report feeling a little off/"foggy" this morning - checked BP upon return to room and WNL. Pt does not feel like she can attribute to anything in particular. States her LLE feels a little more wobbly today as well. Notified primary team as well as encouraged pt to let nursing know if anything else came up.    Therapy Documentation Precautions:  Precautions Precautions: Fall Precaution/Restrictions Comments: L hemipareisis Restrictions Weight Bearing Restrictions Per Provider Order: No   Vital Signs: 110/80 mmHg HR= 94 bpm (At the end of therapy session)  Pain:  Denies pain.     Therapy/Group: Individual Therapy  Karolee Stamps Darrol Poke, PT, DPT, CBIS  01/23/2024, 9:37 AM

## 2024-01-23 NOTE — Plan of Care (Signed)
 Pt demonstrating good progress and therefore the following goals are upgraded: bed mobility and transfer goals as well as standing balance goals.  Problem: RH Balance Goal: LTG Patient will maintain dynamic standing balance (PT) Description: LTG:  Patient will maintain dynamic standing balance with assistance during mobility activities (PT) Flowsheets (Taken 01/23/2024 1734) LTG: Pt will maintain dynamic standing balance during mobility activities with:: Independent with assistive device    Problem: Sit to Stand Goal: LTG:  Patient will perform sit to stand with assistance level (PT) Description: LTG:  Patient will perform sit to stand with assistance level (PT) Flowsheets (Taken 01/23/2024 1734) LTG: PT will perform sit to stand in preparation for functional mobility with assistance level: Independent   Problem: RH Bed Mobility Goal: LTG Patient will perform bed mobility with assist (PT) Description: LTG: Patient will perform bed mobility with assistance, with/without cues (PT). Flowsheets (Taken 01/23/2024 1734) LTG: Pt will perform bed mobility with assistance level of: Independent   Problem: RH Bed to Chair Transfers Goal: LTG Patient will perform bed/chair transfers w/assist (PT) Description: LTG: Patient will perform bed to chair transfers with assistance (PT). Flowsheets (Taken 01/23/2024 1734) LTG: Pt will perform Bed to Chair Transfers with assistance level: Independent

## 2024-01-23 NOTE — Plan of Care (Signed)
  Problem: Consults Goal: RH STROKE PATIENT EDUCATION Description: See Patient Education module for education specifics  Outcome: Progressing   Problem: RH SAFETY Goal: RH STG ADHERE TO SAFETY PRECAUTIONS W/ASSISTANCE/DEVICE Description: STG Adhere to Safety Precautions With cues Assistance/Device. Outcome: Progressing   Problem: RH PAIN MANAGEMENT Goal: RH STG PAIN MANAGED AT OR BELOW PT'S PAIN GOAL Description: < 4 with prns Outcome: Progressing   Problem: RH KNOWLEDGE DEFICIT Goal: RH STG INCREASE KNOWLEGDE OF HYPERLIPIDEMIA Description: Patient and family will be able to manage HLD using educational resources for medications and dietary modifications independently Outcome: Progressing Goal: RH STG INCREASE KNOWLEDGE OF STROKE PROPHYLAXIS Description: Patient and family will be able to manage secondary stroke risks using educational resources for medications and dietary modifications independently Outcome: Progressing

## 2024-01-23 NOTE — Progress Notes (Signed)
 PROGRESS NOTE   Subjective/Complaints:  Using LUE stacking cups, asking about K+ level.  No pain issues   ROS- see HPI. neg CP, SOB, abd pain, N/V/D  Objective:   No results found. Recent Labs    01/23/24 0525  WBC 9.0  HGB 13.9  HCT 41.1  PLT 313    Recent Labs    01/23/24 0525  NA 140  K 3.3*  CL 111  CO2 21*  GLUCOSE 91  BUN 7  CREATININE 1.06*  CALCIUM 9.0     Intake/Output Summary (Last 24 hours) at 01/23/2024 0924 Last data filed at 01/22/2024 1754 Gross per 24 hour  Intake 320 ml  Output --  Net 320 ml        Physical Exam: Vital Signs Blood pressure 106/67, pulse (!) 59, temperature (!) 97.5 F (36.4 C), resp. rate 17, height 5\' 7"  (1.702 m), weight 75 kg, SpO2 99%.   General: No acute distress, resting in bed comfortably.  Mood and affect are appropriate, cheerful  Heart: Regular rate and rhythm no rubs murmurs or extra sounds Lungs: Clear to auscultation, breathing unlabored, no rales or wheezes Abdomen: Positive bowel sounds, soft nontender to palpation, nondistended Extremities: No clubbing, cyanosis, or edema Skin: No evidence of breakdown, no evidence of rash over exposed surfaces.   Neuro: A&O x3  Good sitting balance  Neurologic: Cranial nerves II through XII intact, motor strength is 5/5 in right and 3-/5 left deltoid, bicep, tricep, grip, 5/5 RIght and 4- left hip flexor, knee extensors, 5/5 right and 3- left ankle dorsiflexor and plantar flexor Has weak left 3 point chuck  Sensory exam normal sensation to light touch  in bilateral upper and lower extremities Cerebellar exam limited by weakness LUE  Musculoskeletal: Full passive range of motion in all 4 extremities. No joint swelling 1FB subluxation ,    Assessment/Plan: 1. Functional deficits which require 3+ hours per day of interdisciplinary therapy in a comprehensive inpatient rehab setting. Physiatrist is providing close  team supervision and 24 hour management of active medical problems listed below. Physiatrist and rehab team continue to assess barriers to discharge/monitor patient progress toward functional and medical goals  Care Tool:  Bathing    Body parts bathed by patient: Left arm, Chest, Abdomen, Front perineal area, Buttocks, Right upper leg, Left upper leg, Face, Right lower leg, Left lower leg   Body parts bathed by helper: Right arm, Right lower leg, Left lower leg     Bathing assist Assist Level: Minimal Assistance - Patient > 75%     Upper Body Dressing/Undressing Upper body dressing   What is the patient wearing?: Pull over shirt    Upper body assist Assist Level: Minimal Assistance - Patient > 75%    Lower Body Dressing/Undressing Lower body dressing      What is the patient wearing?: Pants     Lower body assist Assist for lower body dressing: Minimal Assistance - Patient > 75%     Toileting Toileting    Toileting assist Assist for toileting: Minimal Assistance - Patient > 75%     Transfers Chair/bed transfer  Transfers assist     Chair/bed transfer assist  level: Moderate Assistance - Patient 50 - 74%     Locomotion Ambulation   Ambulation assist      Assist level: Minimal Assistance - Patient > 75% Assistive device: No Device Max distance: 250 ft   Walk 10 feet activity   Assist     Assist level: Minimal Assistance - Patient > 75% Assistive device: No Device   Walk 50 feet activity   Assist    Assist level: Minimal Assistance - Patient > 75% Assistive device: No Device    Walk 150 feet activity   Assist    Assist level: Minimal Assistance - Patient > 75% Assistive device: No Device    Walk 10 feet on uneven surface  activity   Assist Walk 10 feet on uneven surfaces activity did not occur: Safety/medical concerns         Wheelchair     Assist Is the patient using a wheelchair?: No Type of Wheelchair:  Manual Wheelchair activity did not occur: Refused         Wheelchair 50 feet with 2 turns activity    Assist    Wheelchair 50 feet with 2 turns activity did not occur: Refused       Wheelchair 150 feet activity     Assist  Wheelchair 150 feet activity did not occur: Refused       Blood pressure 106/67, pulse (!) 59, temperature (!) 97.5 F (36.4 C), resp. rate 17, height 5\' 7"  (1.702 m), weight 75 kg, SpO2 99%.  Medical Problem List and Plan: 1. 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             -patient may  shower             -ELOS/Goals: 4/12 days- supervision- Mod I             -Continue CIR Do not anticipate RTW for several months 2.  Antithrombotics: -DVT/anticoagulation:  Pharmaceutical: Lovenox 40mg  daily -antiplatelet therapy: Plavix 75 mg daily.  Patient with allergy to NSAIDs thus was not placed on aspirin 3. Pain Management: Lido Derm patch 4. Mood/Behavior/Sleep: Prozac 40 mg daily, melatonin 5 mg nightly, Ativan 1 mg every 6 hours as needed anxiety, trazodone 100mg  nightly             -antipsychotic agents: N/A 5. Neuropsych/cognition: This patient is capable of making decisions on her own behalf. 6. Skin/Wound Care: Routine skin check 7. Fluids/Electrolytes/Nutrition: Continue sodium bicarb 650mg  TID. Routine in and outs with follow-up chemistries Pt states she has always had low CO2 levels , did not take bicarb at home, now has low K+ no diuretics, suspect due to bicarb supplementation - d/c bicarb , give 1x dose of KCL and monitor   Recheck still low will need daily KCL recheck 4/7    Latest Ref Rng & Units 01/23/2024    5:25 AM 01/18/2024    5:25 AM 01/16/2024    5:27 AM  BMP  Glucose 70 - 99 mg/dL 91  88  92   BUN 6 - 20 mg/dL 7  8  6     Creatinine 0.44 - 1.00 mg/dL 6.64  4.03  4.74   Sodium 135 - 145 mmol/L 140  140  142   Potassium 3.5 - 5.1 mmol/L 3.3  3.4  3.4   Chloride 98 -  111 mmol/L 111  109  109   CO2 22 - 32 mmol/L 21  21  22    Calcium 8.9 - 10.3 mg/dL 9.0  8.9  9.1    8.  Current History of hemiplegic migraines.  Followed by neurology service Dr. Lucia Gaskins.  Continue Topamax 50mg  daily +50mg  daily PRN- per pt, took off Emgality (which is due ~3/28) as well as Nurtec- No HAs since rehab admission May use prn fioricet 9.  Hyperlipidemia.  Lipitor 80mg  daily 10. 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 Bowel management: continue fibercon and acidophilus, imodium 1mg  q6h PRN, miralax daily PRN LBM 4/6  LOS: 11 days A FACE TO FACE EVALUATION WAS PERFORMED  Erick Colace 01/23/2024, 9:24 AM

## 2024-01-23 NOTE — Progress Notes (Signed)
 Occupational Therapy Session Note  Patient Details  Name: Faith Jordan MRN: [id number] Date of Birth: 01-06-1996  {CHL IP REHAB OT TIME CALCULATIONS:[id number]}   Short Term Goals: Week 2:  OT Short Term Goal 1 (Week 2): STG=LTG d/t ELOS  Skilled Therapeutic Interventions/Progress Updates:    Patient agreeable to participate in OT session. Reports *** pain level.   Patient participated in skilled OT session focusing on ***. Therapist facilitated/assessed/developed/educated/integrated/elicited *** in order to improve/facilitate/promote    Therapy Documentation Precautions:  Precautions Precautions: Fall Precaution/Restrictions Comments: L hemipareisis Restrictions Weight Bearing Restrictions Per Provider Order: No  Therapy/Group: Individual Therapy  Limmie Patricia, OTR/L,CBIS  Supplemental OT - [facility] and [facility] Secure Chat Preferred   01/23/2024, 5:14 PM

## 2024-01-24 NOTE — Progress Notes (Signed)
 Physical Therapy Session Note  Patient Details  Name: Faith Jordan MRN: [id number] Date of Birth: 1996-05-01  Today's Date: 01/24/2024 PT Individual Time: 1015-1128 PT Individual Time Calculation (min): 73 min   Short Term Goals: Week 2:  PT Short Term Goal 1 (Week 2): STG = LTG d/t ELOS  Skilled Therapeutic Interventions/Progress Updates:      Pt supine in bed upon arrival. Pt agreeable to therapy. Pt denies any pain.   Pt ambulated room to day room with supervision, verbal cues provided for forward gaze and L UE arm swing.   Pt ambulated ~15 between two tables to grab various objects off of one table with L UE (cones, golf ball and paper clip), and place them on matching colored dot. Pt picked up 2 golf balls at a time and stacked them on top of cone, pt required increased time to use pinch grip with L UE to place chip clip on rim of cone.   Pt stood on airex pad x2 with feet together while holding tidal tank with B UE, while therapist performed multidirectional perturbations to improve pt overall hip/ankle/stepping strategy--pt required one episode of min A for correction of posterior LOB.   Pt ambulated loop around day room/nursing station while therapist provided multidirectional perturbations. Pt required R UE on wall x1 episode for LOB to the R.   Pt navigated 12 6 inch steps with reciprocal gait and intermittent UE support on handrail needed for stability with CGA, verbal cues provided for L hip and knee flexion versus L hip circumduction.   Pt performed sit to stands 1x10, 1x8 with R LE on airex pad (for improved weight shift to L LE) and B UE across chest, verbal and tactile cues provided for correction of L genu valgus.   Pt ambulated around loop of main gym while holding 2# dowel rod with B UE with therapist standing behind pt holding onto dowel rod to facilitate reciprocal arm swing B.   Pt ambulated main gym to [employer] with no UE support and close supervision, verbal  cues provided for forward gaze, L UE swing and L LE hip flexion with maintenance of slight bend in L knee (for correction of knee hyperextension). Pt required increased focus and difficult to reports difficulty sustaining with fatigue.   Pt requesting heat for L knee at end of session.   Pt supine in bed at end of session with all needs within reach and heat pack to L knee.     Therapy Documentation Precautions:  Precautions Precautions: Fall Precaution/Restrictions Comments: L hemipareisis Restrictions Weight Bearing Restrictions Per Provider Order: No   Therapy/Group: Individual Therapy  [facility] [place], , DPT  01/24/2024, 11:25 AM

## 2024-01-24 NOTE — Progress Notes (Addendum)
 Occupational Therapy Session Note  Patient Details  Name: Faith Jordan MRN: [id number] Date of Birth: 1996/01/16  Today's Date: 01/24/2024 OT Individual Time: 5956-3875 OT Individual Time Calculation (min): 45 min    Short Term Goals: Week 2:  OT Short Term Goal 1 (Week 2): STG=LTG d/t ELOS  Skilled Therapeutic Interventions/Progress Updates:     Pt received sitting up in bed, dressed for the day upon OT arrival. Pt presenting to be in good spirits receptive to skilled OT session reporting 0/10 pain- OT offering intermittent rest breaks, repositioning, and therapeutic support to optimize participation in therapy session. Focused this session on BADL retraining and L hemibody NMRE to increase overall function use for BADLs.   Pt requesting to use restroom at beginning of session. Pt transitioned to EOB and completed ambulatory transfer to bathroom no AD with close supervision. Using grab bars for balance, Pt able to complete 3/3 toileting tasks with close supervision +increased time. Pt then completed oral hygiene and grooming tasks (brushing her hair, brushing teeth, and putting hair into pony tail) while standing at sink no AD with close supervision- mod verbal cues required for modified technique when pulling hair back into pony tail, however Pt was able to complete with increased time and multiple attempts.   Pt completed functional mobility to therapy gym no AD with close supervision- improved pacing noted this session.   Engaged Pt in completed series of exercises and activities in quadruped position to work on L UE shoulder activation, increase proximal strength, and to facilitate WB'ing through L UE for NMRE. Pt completed 1x8 reps of cat/cows, bird dogs, and alternating opposite elbow to knee with OT providing CGA for balance and verbal/tactile cues for muscle activation. Pt then completed cone stacking activity in quadruped position with crossing midline, targeted reaching, and L UE  WB'ing incorporated into task. Pt instructed to utilize L/R UE to transport cones x10 to opposite side of mat while maintaining stability through opposite UE. Pt completed task with min A provided overall when utilizing L UE during functional reaching and to maintain L UE positioning during WB'ing.  Sitting EOM, engaged Pt in completing the following exercises using 2# dumbbell with emphasis on decreasing compensatory body movements, muscle isolation, and to increase L UE motor control for improved functional use during BADLs:  -Bicep curls 2x10 reps -Pronation/supination 2x8 reps -Wrist flexion/extension 2x8 reps -Ulnar/redial deviation 2x8 reps (un-weighted) Pt with improved maintained grasp, bicep activation, and wrist flexion/extension noted this session.   Donned Saebo e-stim unit onto Pt's L deltoid at end of session to increase muscle activation and left it running for 60 minutes following session. Informed PT to doff Saebo during upcoming session with no redness or irritation reported.   Pt ambulated back to room with close supervision at end of session. Pt was left resting in bed with call bell in reach and all needs met.    Therapy Documentation Precautions:  Precautions Precautions: Fall Precaution/Restrictions Comments: L hemipareisis Restrictions Weight Bearing Restrictions Per Provider Order: No   Therapy/Group: Individual Therapy  Clide Deutscher 01/24/2024, 7:54 AM

## 2024-01-24 NOTE — Patient Instructions (Signed)
 Theraputty Home Exercise Program  Complete 1-2 times a day.  putty squeeze  Pt. should squeeze putty in hand trying to keep it round by rotating putty after each squeeze. push fingers through putty to palm each time. Complete 10 times   PUTTY KEY GRIP  Hold the putty at the top of your hand. Squeeze the putty between your thumb and the side of your 2nd finger as shown. Complete 10 times.     PUTTY 3 JAW CHUCK  Roll up some putty into a ball then flatten it. Then, firmly squeeze it with your first 3 fingers as shown. Complete 10 times.      PUTTY PINCH AND PULL  Hold the putty with one hand and then pinch and pull the putty with the target hand. Complete 10 times

## 2024-01-24 NOTE — Progress Notes (Signed)
 Physical Therapy Session Note  Patient Details  Name: Faith Jordan MRN: [id number] Date of Birth: 25-Sep-1996  Today's Date: 01/24/2024 PT Individual Time: 0917-1000 PT Individual Time Calculation (min): 43 min   Short Term Goals: Week 2:  PT Short Term Goal 1 (Week 2): STG = LTG d/t ELOS  Skilled Therapeutic Interventions/Progress Updates:     Pt received supine in bed and agrees to therapy. No complaint of pain. Supine to sit independently. Pt stands with cues for initiation, then ambulates bouts of x150' and x250' with cues for increasing trunk rotation and arm swing to decrease risk for falls. Pt performs toss and catch activity with soccer ball and trampoline to challenge balance, upper extremity coordination, and NMR for LUE. PT provides cues to lift ball as high as possible while limiting compensations. Pt provides CGA for activity, though pt does not have any LOBs. During rest break PT and pt have extended discussion regarding pt's concerns for recovery and adjusting mindset to appreciate pt's progress, rather than emphasizing pt's deficits. Pt ambulates back to room with same assistance and cues. Left supine in bed with all needs within reach.   Therapy Documentation Precautions:  Precautions Precautions: Fall Precaution/Restrictions Comments: L hemipareisis Restrictions Weight Bearing Restrictions Per Provider Order: No   Therapy/Group: Individual Therapy  Beau Fanny, PT, DPT 01/24/2024, 4:09 PM

## 2024-01-24 NOTE — Plan of Care (Signed)
  Problem: Consults Goal: RH STROKE PATIENT EDUCATION Description: See Patient Education module for education specifics  Outcome: Progressing   Problem: RH SAFETY Goal: RH STG ADHERE TO SAFETY PRECAUTIONS W/ASSISTANCE/DEVICE Description: STG Adhere to Safety Precautions With cues Assistance/Device. Outcome: Progressing   Problem: RH PAIN MANAGEMENT Goal: RH STG PAIN MANAGED AT OR BELOW PT'S PAIN GOAL Description: < 4 with prns Outcome: Progressing   Problem: RH KNOWLEDGE DEFICIT Goal: RH STG INCREASE KNOWLEGDE OF HYPERLIPIDEMIA Description: Patient and family will be able to manage HLD using educational resources for medications and dietary modifications independently Outcome: Progressing Goal: RH STG INCREASE KNOWLEDGE OF STROKE PROPHYLAXIS Description: Patient and family will be able to manage secondary stroke risks using educational resources for medications and dietary modifications independently Outcome: Progressing

## 2024-01-24 NOTE — Progress Notes (Signed)
 PROGRESS NOTE   Subjective/Complaints:  Seen with OT this am , repetitive finger flexion and ext much faster, still with proximal weakness at the L delt Discussed return to driving and return to independent living.  Plans to stay with famiy for at least 1 wk   ROS- see HPI. neg CP, SOB, abd pain, N/V/D  Objective:   No results found. Recent Labs    01/23/24 0525  WBC 9.0  HGB 13.9  HCT 41.1  PLT 313    Recent Labs    01/23/24 0525  NA 140  K 3.3*  CL 111  CO2 21*  GLUCOSE 91  BUN 7  CREATININE 1.06*  CALCIUM 9.0     Intake/Output Summary (Last 24 hours) at 01/24/2024 0804 Last data filed at 01/23/2024 1803 Gross per 24 hour  Intake 440 ml  Output --  Net 440 ml        Physical Exam: Vital Signs Blood pressure 97/61, pulse 67, temperature 98.3 F (36.8 C), temperature source Oral, resp. rate 16, height 5\' 7"  (1.702 m), weight 75 kg, SpO2 99%.   General: No acute distress, resting in bed comfortably.  Mood and affect are appropriate, cheerful  Heart: Regular rate and rhythm no rubs murmurs or extra sounds Lungs: Clear to auscultation, breathing unlabored, no rales or wheezes Abdomen: Positive bowel sounds, soft nontender to palpation, nondistended Extremities: No clubbing, cyanosis, or edema Skin: No evidence of breakdown, no evidence of rash over exposed surfaces.   Neuro: A&O x3  Good sitting balance  Neurologic: Cranial nerves II through XII intact, motor strength is 5/5 in right and 3-/5 left deltoid, bicep, tricep, grip, 5/5 RIght and 4- left hip flexor, knee extensors, 5/5 right and 3- left ankle dorsiflexor and plantar flexor Has weak left 3 point chuck  Motor control for finger flex/ext improved  Musculoskeletal: Full passive range of motion in all 4 extremities. No joint swelling     Assessment/Plan: 1. Functional deficits which require 3+ hours per day of interdisciplinary therapy in a  comprehensive inpatient rehab setting. Physiatrist is providing close team supervision and 24 hour management of active medical problems listed below. Physiatrist and rehab team continue to assess barriers to discharge/monitor patient progress toward functional and medical goals  Care Tool:  Bathing    Body parts bathed by patient: Left arm, Chest, Abdomen, Front perineal area, Buttocks, Right upper leg, Left upper leg, Face, Right lower leg, Left lower leg   Body parts bathed by helper: Right arm, Right lower leg, Left lower leg     Bathing assist Assist Level: Minimal Assistance - Patient > 75%     Upper Body Dressing/Undressing Upper body dressing   What is the patient wearing?: Pull over shirt    Upper body assist Assist Level: Minimal Assistance - Patient > 75%    Lower Body Dressing/Undressing Lower body dressing      What is the patient wearing?: Pants     Lower body assist Assist for lower body dressing: Minimal Assistance - Patient > 75%     Toileting Toileting    Toileting assist Assist for toileting: Minimal Assistance - Patient > 75%  Transfers Chair/bed transfer  Transfers assist     Chair/bed transfer assist level: Supervision/Verbal cueing     Locomotion Ambulation   Ambulation assist      Assist level: Minimal Assistance - Patient > 75% Assistive device: No Device Max distance: 250 ft   Walk 10 feet activity   Assist     Assist level: Supervision/Verbal cueing Assistive device: No Device   Walk 50 feet activity   Assist    Assist level: Supervision/Verbal cueing Assistive device: No Device    Walk 150 feet activity   Assist    Assist level: Contact Guard/Touching assist Assistive device: No Device    Walk 10 feet on uneven surface  activity   Assist Walk 10 feet on uneven surfaces activity did not occur: Safety/medical concerns         Wheelchair     Assist Is the patient using a wheelchair?:  No Type of Wheelchair: Manual Wheelchair activity did not occur: Refused         Wheelchair 50 feet with 2 turns activity    Assist    Wheelchair 50 feet with 2 turns activity did not occur: Refused       Wheelchair 150 feet activity     Assist  Wheelchair 150 feet activity did not occur: Refused       Blood pressure 97/61, pulse 67, temperature 98.3 F (36.8 C), temperature source Oral, resp. rate 16, height 5\' 7"  (1.702 m), weight 75 kg, SpO2 99%.  Medical Problem List and Plan: 1. 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             -patient may  shower             -ELOS/Goals: 4/12 days- supervision- Mod I             -Continue CIRPT, OT, team conf in am  Do not anticipate RTW for several months 2.  Antithrombotics: -DVT/anticoagulation:  Pharmaceutical: Lovenox 40mg  daily -antiplatelet therapy: Plavix 75 mg daily.  Patient with allergy to NSAIDs thus was not placed on aspirin 3. Pain Management: Lido Derm patch 4. Mood/Behavior/Sleep: Prozac 40 mg daily, melatonin 5 mg nightly, Ativan 1 mg every 6 hours as needed anxiety, trazodone 100mg  nightly             -antipsychotic agents: N/A 5. Neuropsych/cognition: This patient is capable of making decisions on her own behalf. 6. Skin/Wound Care: Routine skin check 7. Fluids/Electrolytes/Nutrition: Continue sodium bicarb 650mg  TID. Routine in and outs with follow-up chemistries Pt states she has always had low CO2 levels , did not take bicarb at home, now has low K+ no diuretics, suspect due to bicarb supplementation - d/c bicarb , give 1x dose of KCL and monitor   Recheck still low will need daily KCL recheck 4/9    Latest Ref Rng & Units 01/23/2024    5:25 AM 01/18/2024    5:25 AM 01/16/2024    5:27 AM  BMP  Glucose  70 - 99 mg/dL 91  88  92   BUN 6 - 20 mg/dL 7  8  6    Creatinine 0.44 - 1.00 mg/dL 4.09  8.11  9.14   Sodium 135 - 145 mmol/L 140  140  142   Potassium 3.5 - 5.1 mmol/L 3.3  3.4  3.4  Chloride 98 - 111 mmol/L 111  109  109   CO2 22 - 32 mmol/L 21  21  22    Calcium 8.9 - 10.3 mg/dL 9.0  8.9  9.1    8.  Current History of hemiplegic migraines.  Followed by neurology service Dr. Lucia Gaskins.  Continue Topamax 50mg  daily +50mg  daily PRN- per pt, took off Emgality (which is due ~3/28) as well as Nurtec- No HAs since rehab admission May use prn fioricet 9.  Hyperlipidemia.  Lipitor 80mg  daily 10. 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 Bowel management: continue fibercon and acidophilus, imodium 1mg  q6h PRN, miralax daily PRN    LOS: 12 days A FACE TO FACE EVALUATION WAS PERFORMED  Erick Colace 01/24/2024, 8:04 AM

## 2024-01-25 NOTE — Plan of Care (Signed)
  Problem: Consults Goal: RH STROKE PATIENT EDUCATION Description: See Patient Education module for education specifics  Outcome: Progressing   Problem: RH SAFETY Goal: RH STG ADHERE TO SAFETY PRECAUTIONS W/ASSISTANCE/DEVICE Description: STG Adhere to Safety Precautions With cues Assistance/Device. Outcome: Progressing   Problem: RH PAIN MANAGEMENT Goal: RH STG PAIN MANAGED AT OR BELOW PT'S PAIN GOAL Description: < 4 with prns Outcome: Progressing   Problem: RH KNOWLEDGE DEFICIT Goal: RH STG INCREASE KNOWLEGDE OF HYPERLIPIDEMIA Description: Patient and family will be able to manage HLD using educational resources for medications and dietary modifications independently Outcome: Progressing Goal: RH STG INCREASE KNOWLEDGE OF STROKE PROPHYLAXIS Description: Patient and family will be able to manage secondary stroke risks using educational resources for medications and dietary modifications independently Outcome: Progressing

## 2024-01-25 NOTE — Patient Care Conference (Signed)
 Inpatient RehabilitationTeam Conference and Plan of Care Update Date: 01/25/2024   Time: 10:41 AM    Patient Name: Faith Jordan      Medical Record Number: [id number]  Date of Birth: 01/22/1996 Sex: Female         Room/Bed: 4W01C/4W01C-01 Payor Info: Payor: [employer] EMPLOYEE / Plan: [place] [employer] PPO / Product Type: *No Product type* /    Admit Date/Time:  01/12/2024  1:43 PM  Primary Diagnosis:  Stroke of right basal ganglia [facility])  Hospital Problems: Principal Problem:   Stroke of right basal ganglia (HCC) Active Problems:   Cerebrovascular accident (CVA) of right basal ganglia (HCC)   Coping style affecting medical condition    Expected Discharge Date: Expected Discharge Date: 01/28/24  Team Members Present: Physician leading conference: Dr. Claudette Laws Social Worker Present: Dossie Der, LCSW Nurse Present: Chana Bode, RN PT Present: Ralph Leyden, PT OT Present: Mariann Barter, OT SLP Present: Other (comment) Alvera Novel SLP)     Current Status/Progress Goal Weekly Team Focus  Bowel/Bladder   pt is continent of b/b   to remain continent   toilet q4h/ prn    Swallow/Nutrition/ Hydration               ADL's   supervision UB BADLs, close supervision to CGA LB BADLs no AD, ambulatory transfers supervision to intermittent CGA, Improved functional use of L UE with grasp/release and wrist flex/extion now // Barriers: L hemi UE>LE, self-deprivating thoughts   mod I-sup   L UE NMRE, dynamic balance, e-stim, Pt and family education, BADL/IADL retraining, high level cognition    Mobility   Bed mobility = Mod I; transfers = supervision, Mod I for sit<>stand; ambulation = close supervision; higher level balance training and NMR = up to MinA for balance.   supervision overall with Mod I for sit<>stand and sitting balance  Barriers: L weakness in UE>LE, coordination deficits, self defeating thoughts re: future ability to work/ drive /// Work on: continued balance  training, coordination of movements and improving quality of gait, general strengthening, family education    Building services engineer Observations               Pain   no c/o pain   keep pt pain free   assess q shift/prn    Skin   skin is intact   to keep skin intact  to assess q shift/ prn      Discharge Planning:  Mom has been here will see if wants to do formal education this week. Discussing OP and await any equipment needs   Team Discussion: Patient post CVA with history of hemiplegic migraines and on OC.  Good return in left upper extremity, able to pinch/opposition is good.  Working on challenged balance issues.  Patient on target to meet rehab goals: yes, currently needs supervision for upper body care and close supervision for lower body care.  Needs supervision for transfers and close supervision for ambulation with min assist for balance.  Goals for discharge set for mod I overall and supervision for IADLs.  *See Care Plan and progress notes for long and short-term goals.   Revisions to Treatment Plan:  N/a   Teaching Needs: Safety, medications, diet modifications, transfers, toileting, etc.   Current Barriers to Discharge: Decreased caregiver support  Possible Resolutions to Barriers: Family education OP follow up services  Medical Summary Current Status: persistent hypokalemia, improving UE fxn LUE  Barriers to Discharge: Electrolyte abnormality   Possible Resolutions to Barriers/Weekly Focus: cont electrolyte monitoring, work on complex ADL   Continued Need for Acute Rehabilitation Level of Care: The patient requires daily medical management by a physician with specialized training in physical medicine and rehabilitation for the following reasons: Direction of a multidisciplinary physical rehabilitation program to maximize functional independence : Yes Medical management of patient stability for increased  activity during participation in an intensive rehabilitation regime.: Yes Analysis of laboratory values and/or radiology reports with any subsequent need for medication adjustment and/or medical intervention. : Yes   I attest that I was present, lead the team conference, and concur with the assessment and plan of the team.   Chana Bode B 01/25/2024, 3:34 PM

## 2024-01-25 NOTE — Progress Notes (Signed)
 Physical Therapy Session Note  Patient Details  Name: Faith Jordan MRN: [id number] Date of Birth: [birth year]  Today's Date: 01/25/2024 PT Individual Time: 0806-0850 PT Individual Time Calculation (min): 44 min   Short Term Goals: Week 1:  PT Short Term Goal 1 (Week 1): Pt will perform standing transfers with decreased impulsivity and overall CGA. PT Short Term Goal 1 - Progress (Week 1): Met PT Short Term Goal 2 (Week 1): Pt will ambulate community distances with overall CGA/ supervision and no LLE hyperextension. PT Short Term Goal 2 - Progress (Week 1): Progressing toward goal PT Short Term Goal 3 (Week 1): Pt will perform stairs with 1HR and overall supervision. PT Short Term Goal 3 - Progress (Week 1): Met PT Short Term Goal 4 (Week 1): Pt will improve Berg Balance score by at least 5 points. PT Short Term Goal 4 - Progress (Week 1): Met Week 2:  PT Short Term Goal 1 (Week 2): STG = LTG d/t ELOS  Skilled Therapeutic Interventions/Progress Updates:  Patient supine in bed on entrance to room. Patient alert and agreeable to PT session. RN leaving room after providing morning meds. Pt now Mod I in room for toileting. No bed alarm used at this time.   Patient with no pain complaint at start of session. Does relate increased L knee soreness yesterday following sessions. Related that strength is returning and knee function is fairly stable so with increased strength comes increased pull of muscles on tendons and bony attachments. Also possibility of strain in ligaments and around cartilage of knee. All should resolve with continued strengthening and healing.   Therapeutic Activity: Transfers: Pt performed sit<>stand and stand pivot transfers throughout session with Mod I/ IND for extra time and continued focus to performance. No cueing provided for technique.   Gait Training/ NMR:  Pt ambulated *** ft using *** with ***. Demonstrated ***. Provided vc/ tc for ***.   NMR facilitated during  session with focus on ***. Pt guided in ***. NMR performed for improvements in motor control and coordination, balance, sequencing, judgement, and self confidence/ efficacy in performing all aspects of mobility at highest level of independence.   - ambulation with focus on hip mobility - ambulation over tall wedge and ____ - stance on teeter board - dynamic jumping then progressing to using arm swing with jumping - walking with arm swing using 2# weighted dowel that pt is able to hold in her grasp with L hand. Dowel removed and pt then cued to focus on increasing L arm swing. Good improvement.   Patient *** at end of session with brakes locked, *** alarm set, and all needs within reach.   Therapy Documentation Precautions:  Precautions Precautions: Fall Precaution/Restrictions Comments: L hemipareisis Restrictions Weight Bearing Restrictions Per Provider Order: No  Pain: Pain Assessment Pain Scale: 0-10 Pain Score: 0-No pain related during session. Mild soreness noted in L knee when walking but refuses heat or medication at end of session.    Therapy/Group: Individual Therapy  Loel Dubonnet 01/25/2024, 9:35 AM

## 2024-01-25 NOTE — Progress Notes (Signed)
 Occupational Therapy Session Note  Patient Details  Name: Faith Jordan MRN: [id number] Date of Birth: 09-07-1996  Today's Date: 01/25/2024 OT Individual Time: 4098-1191 OT Individual Time Calculation (min): 75 min    Short Term Goals: Week 1:  OT Short Term Goal 1 (Week 1): Pt will utilize L UE as a gross assist during BADLs with min A OT Short Term Goal 1 - Progress (Week 1): Met OT Short Term Goal 2 (Week 1): Pt will complete toilet transfers CGA usign LRAD OT Short Term Goal 2 - Progress (Week 1): Met OT Short Term Goal 3 (Week 1): Pt will complete grooming/hygine tasks with set-up A using hemi-techniques PRN OT Short Term Goal 3 - Progress (Week 1): Met OT Short Term Goal 4 (Week 1): Pt will complete LB dressing using LRAD and hemi techniques PRN with CGA OT Short Term Goal 4 - Progress (Week 1): Met Week 2:  OT Short Term Goal 1 (Week 2): STG=LTG d/t ELOS      Skilled Therapeutic Interventions/Progress Updates:    Pt received in room ready for therapy.  Ambulated to orthogym with supervision to engage in NMR for LUE: -modified plank on elevated therapy mat for L hand weight bearing, core and LE engagement with pt at a 30 degree plank angle - added in a hand walk exercises for L UE coordination -mini push ups on B hands in mod plank -standing at wall for wall slides. Assist from therapist for full sh flexion, pt held for isometric control and then worked on controlled eccentric descent -supine with 1.5 lb wrist wt on L wrist for controlled tricep flex/ext, added in overhead arm circles -PNF D1 D2 resisted arm movements with green theraband - pt worked on grasp strength on band pulling across body -supine trunk curls with assist from band as pt held onto band and then resisted to move to supine with elbow pulls maintaining grasp on band -during rest period for UEs, pt worked on bridges and resisted bridges with band across hips and pt held band with each hand down on mat - pulling  band apart for sh abd  Estim to thenar eminance for lateral pinch strength using Zynex estim 10 sec on and off at intensity 15.  Used for 15 minutes integrating functional task of holding bean bags with tips of fingers.    Pt ambulated back to room with all needs met.   Therapy Documentation Precautions:  Precautions Precautions: Fall Precaution/Restrictions Comments: L hemipareisis Restrictions Weight Bearing Restrictions Per Provider Order: No    Vital Signs: Therapy Vitals Temp: 97.6 F (36.4 C) Temp Source: Oral Pulse Rate: (!) 58 Resp: 18 BP: (!) 95/49 Patient Position (if appropriate): Lying Oxygen Therapy SpO2: 98 % O2 Device: Room Air Pain: Pain Assessment Pain Scale: 0-10 Pain Score: 0-No pain ADL: ADL Eating: Set up Where Assessed-Eating: Chair Grooming: Moderate assistance Where Assessed-Grooming: Chair Upper Body Bathing: Minimal assistance Where Assessed-Upper Body Bathing: Shower Lower Body Bathing: Minimal assistance Where Assessed-Lower Body Bathing: Shower Upper Body Dressing: Minimal assistance Where Assessed-Upper Body Dressing: Edge of bed Lower Body Dressing: Minimal assistance Where Assessed-Lower Body Dressing: Edge of bed Toileting: Minimal assistance Where Assessed-Toileting: Teacher, adult education: Curator Method: Proofreader: Engineer, technical sales: Not assessed Film/video editor: Insurance underwriter Method: Designer, industrial/product: Grab bars    Therapy/Group: Individual Therapy  [name] 01/25/2024, 8:51 AM

## 2024-01-25 NOTE — Progress Notes (Signed)
 Occupational Therapy Session Note  Patient Details  Name: Faith Jordan MRN: [id number] Date of Birth: 09-Oct-1996  Today's Date: 01/25/2024 OT Individual Time: 1450-1530 OT Individual Time Calculation (min): 40 min    Short Term Goals: Week 2:  OT Short Term Goal 1 (Week 2): STG=LTG d/t ELOS  Skilled Therapeutic Interventions/Progress Updates:     Pt received reclined in bed with Father present in room. Pt presenting to be in good spirits receptive to skilled OT session reporting 0/10 pain- OT offering intermittent rest breaks, repositioning, and therapeutic support to optimize participation in therapy session. Pt requesting to take shower this session. Focused this session on BADL retraining with L UE functional use and dynamic balance incorporated into session to increase overall safety and independence in BADLs. Pt completed functional mobility within her room to gather clothing and towels with distant supervision, no LOB- Pt able to utilize L UE to retrieve towels from high shelf without dropping demonstrating ~110 degrees of shoulder flexion!! Pt transferred to toilet and completed 3/3 toileting tasks distant supervision. Ambulatory transfer to shower seat in walk-in shower distant supervision. Pt stood for a majority of shower for increased balance challenge and utilized L UE to control [facility] for >50% of shower maintaining dynamic balance with supervision. Pt sat to wash lower body crossing legs into figure-four position. Pt able to utilize L UE at a diminished level throughout shower with supervision!! Following shower, Pt completed U/LB bathing in seated position donning bra, pull over head shirt, underwear, pants, socks, and slip on shoes with supervision. Engaged Pt in cleaning up and ringing out wet towels to simulate home management activities with pt able to retrieve towels from ground utilizing L UE and integrate B UE into wringing out towels without dropping- improved maintained grasp and  grip strength noted. Remainder of session focused on engaging Pt in simulated nursing skill activities with Pt instructed to tie a tourniquet around OT's arm with pt able to complete x2 trials when giving maximal effort with improved tightness noted this session. Pt was left resting in bed with call bell in reach and all needs met.    Therapy Documentation Precautions:  Precautions Precautions: Fall Precaution/Restrictions Comments: L hemipareisis Restrictions Weight Bearing Restrictions Per Provider Order: No  Therapy/Group: Individual Therapy  Clide Deutscher 01/25/2024, 3:13 PM

## 2024-01-26 NOTE — Progress Notes (Signed)
 Occupational Therapy Session Note  Patient Details  Name: Faith Jordan MRN: [id number] Date of Birth: 07-18-1996  Today's Date: 01/26/2024 OT Individual Time: 1914-7829 OT Individual Time Calculation (min): 45 min  OT Individual Time: 1000-1100 OT Individual Time Calculation (min): 60 min   Short Term Goals: Week 2:  OT Short Term Goal 1 (Week 2): STG=LTG d/t ELOS  Skilled Therapeutic Interventions/Progress Updates:     Session 1: Pt received semi-reclined in bed with MD present in room. Pt demonstrating improved digit opposition, increased shoulder flexion, and improved VMC during MD's morning rounding assessment. Pt  presenting to be in good spirits receptive to skilled OT session reporting 0/10 pain- OT offering intermittent rest breaks, repositioning, and therapeutic support to optimize participation in therapy session. Pt dressed and ready for the day upon OT arrival politely declining need for BADLs this session- focused session on L UE NMR for increased functional use during BADLs, IADLs, and work related tasks. Pt ambulated to therapy gym mod I no AD. Pt participated in table top FM NMRE activities including stacking 1" blocks (up to 10 blocks), threading beads beginning with large beads and progressing to small beads with improvement, and placing medium sized pegs into board. Pt able to complete tasks with increased time provided for motor planning and min verbal/tactile cues to avoid compensatory body movements or shoulder elevation. While seated in chair, Pt completed resisted external/internal shoulder rotation using yellow therband, shoulder abduction, and PNF D1 & D2 resisted arm movements to work on maintained grasp, pulling band across midline, increasing shoulder activation, and decreasing compensatory techniques to improve functional movements patterns. Standing at wall, Pt completed L UE wall angles, shoulder flexion wall glides, and wall clocks to work on shoulder control- min A  required to achieve full movement with Pt then able to maintain isometric control and complete controlled eccentric decent. Donned SAEBO e-stim unit onto Pt's L deltoid at end of session to increase shoulder activation and left it running for 60 minutes following session. Pt ambulated back to room no AD mod I. Pt was left resting in bed with call bell in reach and all needs met.    Session 2: Pt received sitting up in bed presenting to be in good spirits receptive to skilled OT session reporting 0/10 pain- OT offering intermittent rest breaks, repositioning, and therapeutic support to optimize participation in therapy session. Doffed SAEBO at beginning of session with no redness or skin irritation noted. Pt participated in simulated community outing at an ambulatory level no AD to at an overall supervision level to increase self efficacy and safety in returning to the community at d/c. Pt navigated crowded areas/obstacles, ambulated across uneven outdoor surfaces including slopes, grass, mulch, and stairs while carrying objects with supervision. Pt participated in ordering meal from restaurant and transferring in/out of booth with supervision. Provided education on energy conservation, fall prevention, and CVA recovery process as it relates to returning to the community environment with Pt receptive to education. Discussed psychosocial impact of returning to community following CVA with Pt participatory in discussion and presenting to have improved self-efficacy in returning to community than compared to previous sessions. Pt reporting simulated community outing was beneficial in increasing her self-confidence and motivation to return to community activities. Pt was left resting in bed with call bell in reach and all needs met.    Therapy Documentation Precautions:  Precautions Precautions: Fall Precaution/Restrictions Comments: L hemipareisis Restrictions Weight Bearing Restrictions Per Provider Order:  No   Therapy/Group:  Individual Therapy  Clide Deutscher, OTR/L  01/26/2024, 8:00 AM

## 2024-01-26 NOTE — Progress Notes (Signed)
 Physical Therapy Session Note  Patient Details  Name: Faith Jordan MRN: [id number] Date of Birth: [birth year]  Today's Date: 01/26/2024 PT Individual Time: 1300-1340 PT Individual Time Calculation (min): 40 min   Short Term Goals: Week 2:  PT Short Term Goal 1 (Week 2): STG = LTG d/t ELOS  Skilled Therapeutic Interventions/Progress Updates:    Session focused on self directed goals of floor transfers and bathtub transfer. Pt performed functional gait on unit without device with overall supervision to modified independent, slower gait speed noted overall and decreased arm swing. Performed floor transfer with CGA overall for safety due to L sided weakness, cues for technique. Reviewed what to do in case of fall and use chair/couch/bed or something for support to stand up if that was the case. Pt completed for overall strengthening and NMR without use of external aid with CGA leading with LLE and attempted with RLE leading but unable with significantly increased assist. Completed x 2 full repetitions (1 time from supine and once from quadruped).  Tub transfer in ADL apartment tub/show combo and discussed her home set up (her parents house with stand alone tub but unsure of full set up but thinks there would be a wall on either end (head and foot). CGA to get unto the tub lowering down using UE's. Required min/mod assist to get back out. Discussed increased fall risk with being wet/naked and ultimately recommending not to do this just yet until able to improve skill on dry land. Pt in agreement.   NMR to address strength and balance with progressively lower sit <> stands/squats to simulate getting up from floor/tub to improve overall carryover to mobility starting at 15" height, 13", 12" and 11" height x 5 reps each height without UE support with supervision. 1 episode of L knee instability during lowest height, likely from fatigue.  Pt continues to make excellent functional progress overall. Educated  on outpatient therapies and continued progression of exercises.   Returned back to room and positioned in bed with all needs in reach.  Therapy Documentation Precautions:  Precautions Precautions: Fall Precaution/Restrictions Comments: L hemipareisis Restrictions Weight Bearing Restrictions Per Provider Order: No  Pain:  Denies pain.     Therapy/Group: Individual Therapy  Karolee Stamps Darrol Poke, PT, DPT, CBIS  01/26/2024, 1:43 PM

## 2024-01-26 NOTE — Progress Notes (Signed)
 PROGRESS NOTE   Subjective/Complaints:  No issues overnite , did modified skip with PT yesterday    ROS- see HPI. neg CP, SOB, abd pain, N/V/D  Objective:   No results found. No results for input(s): "WBC", "HGB", "HCT", "PLT" in the last 72 hours.   No results for input(s): "NA", "K", "CL", "CO2", "GLUCOSE", "BUN", "CREATININE", "CALCIUM" in the last 72 hours.    Intake/Output Summary (Last 24 hours) at 01/26/2024 0804 Last data filed at 01/25/2024 1837 Gross per 24 hour  Intake 480 ml  Output --  Net 480 ml        Physical Exam: Vital Signs Blood pressure (!) 91/47, pulse (!) 57, temperature 97.6 F (36.4 C), temperature source Oral, resp. rate 18, height 5\' 7"  (1.702 m), weight 75 kg, SpO2 100%.   General: No acute distress, resting in bed comfortably.  Mood and affect are appropriate, cheerful  Heart: Regular rate and rhythm no rubs murmurs or extra sounds Lungs: Clear to auscultation, breathing unlabored, no rales or wheezes Abdomen: Positive bowel sounds, soft nontender to palpation, nondistended Extremities: No clubbing, cyanosis, or edema Skin: No evidence of breakdown, no evidence of rash over exposed surfaces.   Neuro: A&O x3  Good sitting balance  Neurologic: Cranial nerves II through XII intact, motor strength is 5/5 in right and 3/5 left deltoid, bicep, tricep, grip, 5/5 RIght and 4- left hip flexor, knee extensors, 5/5 right and 3- left ankle dorsiflexor and plantar flexor Has weak left 3 point chuck  Motor control - able to oppose Left thumb to all 4 fingers  Mild dysmetria L FNF Musculoskeletal: Full passive range of motion in all 4 extremities. No joint swelling     Assessment/Plan: 1. Functional deficits which require 3+ hours per day of interdisciplinary therapy in a comprehensive inpatient rehab setting. Physiatrist is providing close team supervision and 24 hour management of active  medical problems listed below. Physiatrist and rehab team continue to assess barriers to discharge/monitor patient progress toward functional and medical goals  Care Tool:  Bathing    Body parts bathed by patient: Left arm, Chest, Abdomen, Front perineal area, Buttocks, Right upper leg, Left upper leg, Face, Right lower leg, Left lower leg, Right arm   Body parts bathed by helper: Right arm, Right lower leg, Left lower leg     Bathing assist Assist Level: Supervision/Verbal cueing     Upper Body Dressing/Undressing Upper body dressing   What is the patient wearing?: Pull over shirt    Upper body assist Assist Level: Set up assist    Lower Body Dressing/Undressing Lower body dressing      What is the patient wearing?: Pants     Lower body assist Assist for lower body dressing: Supervision/Verbal cueing     Toileting Toileting    Toileting assist Assist for toileting: Supervision/Verbal cueing     Transfers Chair/bed transfer  Transfers assist     Chair/bed transfer assist level: Supervision/Verbal cueing     Locomotion Ambulation   Ambulation assist      Assist level: Minimal Assistance - Patient > 75% Assistive device: No Device Max distance: 250 ft   Walk 10 feet  activity   Assist     Assist level: Supervision/Verbal cueing Assistive device: No Device   Walk 50 feet activity   Assist    Assist level: Supervision/Verbal cueing Assistive device: No Device    Walk 150 feet activity   Assist    Assist level: Contact Guard/Touching assist Assistive device: No Device    Walk 10 feet on uneven surface  activity   Assist Walk 10 feet on uneven surfaces activity did not occur: Safety/medical concerns         Wheelchair     Assist Is the patient using a wheelchair?: No Type of Wheelchair: Manual Wheelchair activity did not occur: Refused         Wheelchair 50 feet with 2 turns activity    Assist    Wheelchair 50  feet with 2 turns activity did not occur: Refused       Wheelchair 150 feet activity     Assist  Wheelchair 150 feet activity did not occur: Refused       Blood pressure (!) 91/47, pulse (!) 57, temperature 97.6 F (36.4 C), temperature source Oral, resp. rate 18, height 5\' 7"  (1.702 m), weight 75 kg, SpO2 100%.  Medical Problem List and Plan: 1. 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             -patient may  shower             -ELOS/Goals: 4/12 days- supervision- Mod I             -Continue CIRPT, OT, team conf in am  Do not anticipate RTW for several months 2.  Antithrombotics: -DVT/anticoagulation:  Pharmaceutical: Lovenox 40mg  daily -antiplatelet therapy: Plavix 75 mg daily.  Patient with allergy to NSAIDs thus was not placed on aspirin 3. Pain Management: Lido Derm patch 4. Mood/Behavior/Sleep: Prozac 40 mg daily, melatonin 5 mg nightly, Ativan 1 mg every 6 hours as needed anxiety, trazodone 100mg  nightly             -antipsychotic agents: N/A 5. Neuropsych/cognition: This patient is capable of making decisions on her own behalf. 6. Skin/Wound Care: Routine skin check 7. Fluids/Electrolytes/Nutrition: Continue sodium bicarb 650mg  TID. Routine in and outs with follow-up chemistries Pt states she has always had low CO2 levels , did not take bicarb at home, now has low K+ no diuretics, suspect due to bicarb supplementation - d/c bicarb , give 1x dose of KCL and monitor   Recheck still low will need daily KCL recheck 4/9    Latest Ref Rng & Units 01/23/2024    5:25 AM 01/18/2024    5:25 AM 01/16/2024    5:27 AM  BMP  Glucose 70 - 99 mg/dL 91  88  92   BUN 6 - 20 mg/dL 7  8  6    Creatinine 0.44 - 1.00 mg/dL 1.61  0.96  0.45   Sodium 135 - 145 mmol/L 140  140  142   Potassium  3.5 - 5.1 mmol/L 3.3  3.4  3.4   Chloride 98 - 111 mmol/L 111  109  109   CO2 22 - 32 mmol/L 21  21  22    Calcium 8.9 - 10.3 mg/dL 9.0  8.9  9.1    8.  Current History of hemiplegic migraines.  Followed by neurology service  Dr. Lucia Gaskins.  Continue Topamax 50mg  daily +50mg  daily PRN- per pt, took off Emgality (which is due ~3/28) as well as Nurtec- No HAs since rehab admission May use prn fioricet but has not required  9.  Hyperlipidemia.  Lipitor 80mg  daily 10. 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 Bowel management: continue fibercon and acidophilus, imodium 1mg  q6h PRN, miralax daily PRN    LOS: 14 days A FACE TO FACE EVALUATION WAS PERFORMED  Erick Colace 01/26/2024, 8:04 AM

## 2024-01-26 NOTE — Progress Notes (Signed)
 Physical Therapy Session Note  Patient Details  Name: Faith Jordan MRN: [id number] Date of Birth: 1996/10/09  Today's Date: 01/26/2024 PT Individual Time: 1400-1455 PT Individual Time Calculation (min): 55 min   Short Term Goals: Week 1:  PT Short Term Goal 1 (Week 1): Pt will perform standing transfers with decreased impulsivity and overall CGA. PT Short Term Goal 1 - Progress (Week 1): Met PT Short Term Goal 2 (Week 1): Pt will ambulate community distances with overall CGA/ supervision and no LLE hyperextension. PT Short Term Goal 2 - Progress (Week 1): Progressing toward goal PT Short Term Goal 3 (Week 1): Pt will perform stairs with 1HR and overall supervision. PT Short Term Goal 3 - Progress (Week 1): Met PT Short Term Goal 4 (Week 1): Pt will improve Berg Balance score by at least 5 points. PT Short Term Goal 4 - Progress (Week 1): Met Week 2:  PT Short Term Goal 1 (Week 2): STG = LTG d/t ELOS  Skilled Therapeutic Interventions/Progress Updates:   Received pt semi-reclined, pt agreeable to PT treatment, and denied any pain during session. Session with emphasis on discharge planning, functional mobility/transfers, generalized strengthening and endurance, dynamic standing balance/coordination, and gait training.   Pt performed all transfers without AD and mod I throughout session. Pt ambulated 142ft without AD and mod I to dayroom. Time spent working on job related tasks pt will encounter at work with emphasis on functional use of LUE per pt's request. Pt wrapped therapist's leg and wrist using ace wrap with increased time. Pt dropping and unrolling ace wrap a few times but able to complete task without assist. Ambulated 126ft without AD and mod I main therapy gym, grabbed grocery cart, and ambulated additional 11ft with grocery cart independently to ADL apartment. Simulated making meal reaching for plates in high cabinets, bowls, glasses, silverware, and food inside fridge (emphasis on  functional use of LUE). Pt able to squat down to ground to retrieve items without assist. Tasked pt with making snack (peanut butter crackers using LUE) and pt able to do so with increased time.   Then ambulated >336ft x 1 and >565ft x 2 trials with grocery cart to retreive/return mannequin to practice CPR on. Pt assisted therapist with carrying mannequin to mat, then simulated 3 rounds of CPR on mannequin. Pt then practiced with bed mobility transferring mannequin from sit<>supine and supine<>sit to simulate having to assist her patients in/out of bed. Returned to room and concluded session with pt sitting EOB with all needs within reach.   Therapy Documentation Precautions:  Precautions Precautions: Fall Precaution/Restrictions Comments: L hemipareisis Restrictions Weight Bearing Restrictions Per Provider Order: No  Therapy/Group: Individual Therapy Marlana Salvage Zaunegger Blima Rich PT, DPT 01/26/2024, 7:08 AM

## 2024-01-26 NOTE — Progress Notes (Signed)
 Recreational Therapy Session Note  Patient Details  Name: Faith Jordan MRN: [id number] Date of Birth: [birth year] Today's Date: 01/26/2024  Goal:  Pt will be provided with education on community reintegration  MET Pt will ambulated while carrying objects on uneven outdoor surfaces with supervision.  MET  Pain: no c/o Skilled Therapeutic Interventions/Progress Updates: Pt participated in a simulated outing ambulatory level at overall supervision level, no AD.  Pt navigated crowded areas/obstacles, on various uneven outdoor surfaces including slopes, grass, mulch with supervision while carrying objects.  Discussed community mobility at various locations, obstacle identification/negotiation.  Also discussed upcoming discharge focused on safety with her dog.  Discussed sitting down and having someone bring her dog in to visit with her to avoid being knocked down.  Pt sated understanding.   Kasee Hantz 01/26/2024, 11:51 AM

## 2024-01-26 NOTE — Progress Notes (Signed)
 Patient ID: Faith Jordan, female   DOB: 05/01/1996, 28 y.o.   MRN: [id number]  Met with pt to give her the team conference update and progress this week. She feels ready to go home on Sat and her Mom is coming in tomorrow for some family education. Have sent order to OP Neuro on [address] the Op they prefer. No equipment needs. Continue to work on discharge needs for Sat discharge.

## 2024-01-26 NOTE — Plan of Care (Signed)
  Problem: RH SAFETY Goal: RH STG ADHERE TO SAFETY PRECAUTIONS W/ASSISTANCE/DEVICE Description: STG Adhere to Safety Precautions With cues Assistance/Device. Outcome: Progressing   Problem: RH PAIN MANAGEMENT Goal: RH STG PAIN MANAGED AT OR BELOW PT'S PAIN GOAL Description: < 4 with prns Outcome: Progressing

## 2024-01-26 NOTE — Progress Notes (Signed)
 Physical Therapy Session Note  Patient Details  Name: Faith Jordan MRN: [id number] Date of Birth: 1996-05-26  Today's Date: 01/25/2024 PT Individual Time: 8295-6213  PT Individual Time Calculation (min): 39 min  Short Term Goals: Week 1:  PT Short Term Goal 1 (Week 1): Pt will perform standing transfers with decreased impulsivity and overall CGA. PT Short Term Goal 1 - Progress (Week 1): Met PT Short Term Goal 2 (Week 1): Pt will ambulate community distances with overall CGA/ supervision and no LLE hyperextension. PT Short Term Goal 2 - Progress (Week 1): Progressing toward goal PT Short Term Goal 3 (Week 1): Pt will perform stairs with 1HR and overall supervision. PT Short Term Goal 3 - Progress (Week 1): Met PT Short Term Goal 4 (Week 1): Pt will improve Berg Balance score by at least 5 points. PT Short Term Goal 4 - Progress (Week 1): Met Week 2:  PT Short Term Goal 1 (Week 2): STG = LTG d/t ELOS  Skilled Therapeutic Interventions/Progress Updates:  Patient seated on EOB on entrance to room. Patient alert and agreeable to PT session.   Patient with no pain complaint at start of session. Does relate recent diarrhea and potential need for returning to room quickly.   Gait Training/ NMR:  Gait training initiated on treadmill with pt using BHR for balance throughout. Pt ambulated >44min reaching 0.15miles total. Speed varied throughout from 1.0 to 1.43mph. Also varied incline between levels 2-6. Pt does demo fatigue with intermittent scuff of L foot on treadmill but overall performs well. Noted increased collapse of L ankle into inversion past normal.    After treadmill, guided pt in ambulation holding onto gait belt with RUE as though her dog's leash. While ambulating >800 ft, tugged on "leash" in all directions with pt able to maintain balance with good UE strength noted to keep control of "dog".   Patient seated on EOB at end of session with brakes locked, no alarm set, and all needs  within reach.   Therapy Documentation Precautions:  Precautions Precautions: Fall Precaution/Restrictions Comments: L hemipareisis Restrictions Weight Bearing Restrictions Per Provider Order: No  Pain:  No pain related this session.     Therapy/Group: Individual Therapy  Loel Dubonnet PT, DPT, CSRS 01/26/2024, 6:50 AM

## 2024-01-27 ENCOUNTER — Other Ambulatory Visit (HOSPITAL_COMMUNITY): Payer: Self-pay

## 2024-01-27 LAB — POTASSIUM: Potassium: 3.6 mmol/L (ref 3.5–5.1)

## 2024-01-27 MED ORDER — TOPIRAMATE 50 MG PO TABS
ORAL_TABLET | ORAL | 3 refills | Status: AC
  Filled 2024-01-27 – 2024-03-28 (×2): qty 60, 30d supply, fill #0
  Filled 2024-05-23: qty 60, 30d supply, fill #1
  Filled 2024-07-28: qty 60, 30d supply, fill #2
  Filled 2024-09-27: qty 60, 30d supply, fill #3

## 2024-01-27 MED ORDER — LORAZEPAM 1 MG PO TABS
1.0000 mg | ORAL_TABLET | Freq: Four times a day (QID) | ORAL | 0 refills | Status: DC | PRN
  Filled 2024-01-27: qty 30, 8d supply, fill #0

## 2024-01-27 MED ORDER — ACIDOPHILUS PO CAPS
1.0000 | ORAL_CAPSULE | Freq: Every day | ORAL | 0 refills | Status: DC
  Filled 2024-01-27: qty 30, 30d supply, fill #0

## 2024-01-27 MED ORDER — TRAZODONE HCL 100 MG PO TABS
100.0000 mg | ORAL_TABLET | Freq: Every day | ORAL | 0 refills | Status: DC
Start: 2024-01-27 — End: 2024-02-23
  Filled 2024-01-27: qty 30, 30d supply, fill #0

## 2024-01-27 MED ORDER — FLUOXETINE HCL 40 MG PO CAPS
40.0000 mg | ORAL_CAPSULE | Freq: Every day | ORAL | 0 refills | Status: DC
  Filled 2024-01-27: qty 30, 30d supply, fill #0

## 2024-01-27 MED ORDER — POTASSIUM CHLORIDE CRYS ER 10 MEQ PO TBCR
10.0000 meq | EXTENDED_RELEASE_TABLET | Freq: Every day | ORAL | 0 refills | Status: DC
Start: 2024-01-28 — End: 2024-02-14
  Filled 2024-01-27: qty 30, 30d supply, fill #0

## 2024-01-27 MED ORDER — CLOPIDOGREL BISULFATE 75 MG PO TABS
75.0000 mg | ORAL_TABLET | Freq: Every day | ORAL | 0 refills | Status: DC
Start: 2024-01-27 — End: 2024-02-08
  Filled 2024-01-27: qty 30, 30d supply, fill #0

## 2024-01-27 MED ORDER — ATORVASTATIN CALCIUM 80 MG PO TABS
80.0000 mg | ORAL_TABLET | Freq: Every day | ORAL | 0 refills | Status: DC
  Filled 2024-01-27: qty 30, 30d supply, fill #0

## 2024-01-27 MED ORDER — LIDOCAINE 5 % EX PTCH
1.0000 | MEDICATED_PATCH | CUTANEOUS | 0 refills | Status: DC
  Filled 2024-01-27: qty 30, 30d supply, fill #0

## 2024-01-27 NOTE — Plan of Care (Signed)
  Problem: RH Balance Goal: LTG Patient will maintain dynamic sitting balance (PT) Description: LTG:  Patient will maintain dynamic sitting balance with assistance during mobility activities (PT) 01/27/2024 1721 by Loel Dubonnet, PT Outcome: Completed/Met 01/27/2024 1715 by Loel Dubonnet, PT Flowsheets (Taken 01/13/2024 1850) LTG: Pt will maintain dynamic sitting balance during mobility activities with:: Independent Goal: LTG Patient will maintain dynamic standing balance (PT) Description: LTG:  Patient will maintain dynamic standing balance with assistance during mobility activities (PT) 01/27/2024 1721 by Loel Dubonnet, PT Outcome: Completed/Met 01/27/2024 1715 by Loel Dubonnet, PT Flowsheets (Taken 01/27/2024 1715) LTG: Pt will maintain dynamic standing balance during mobility activities with:: Independent with assistive device    Problem: Sit to Stand Goal: LTG:  Patient will perform sit to stand with assistance level (PT) Description: LTG:  Patient will perform sit to stand with assistance level (PT) 01/27/2024 1721 by Loel Dubonnet, PT Outcome: Completed/Met 01/27/2024 1715 by Loel Dubonnet, PT Flowsheets (Taken 01/23/2024 1734) LTG: PT will perform sit to stand in preparation for functional mobility with assistance level: Independent   Problem: RH Bed Mobility Goal: LTG Patient will perform bed mobility with assist (PT) Description: LTG: Patient will perform bed mobility with assistance, with/without cues (PT). 01/27/2024 1721 by Loel Dubonnet, PT Outcome: Completed/Met 01/27/2024 1715 by Loel Dubonnet, PT Flowsheets (Taken 01/23/2024 1734) LTG: Pt will perform bed mobility with assistance level of: Independent   Problem: RH Bed to Chair Transfers Goal: LTG Patient will perform bed/chair transfers w/assist (PT) Description: LTG: Patient will perform bed to chair transfers with assistance (PT). 01/27/2024 1721 by Loel Dubonnet, PT Outcome: Completed/Met 01/27/2024 1715 by Loel Dubonnet, PT Flowsheets (Taken 01/23/2024 1734) LTG: Pt will perform Bed to Chair Transfers with assistance level: Independent   Problem: RH Ambulation Goal: LTG Patient will ambulate in home environment (PT) Description: LTG: Patient will ambulate in home environment, # of feet with assistance (PT). 01/27/2024 1721 by Loel Dubonnet, PT Outcome: Completed/Met 01/27/2024 1715 by Loel Dubonnet, PT Flowsheets (Taken 01/13/2024 1850) LTG: Pt will ambulate in home environ  assist needed:: Supervision/Verbal cueing LTG: Ambulation distance in home environment: up to 50 ft per bout using LRAD Goal: LTG Patient will ambulate in community environment (PT) Description: LTG: Patient will ambulate in community environment, # of feet with assistance (PT). 01/27/2024 1721 by Loel Dubonnet, PT Outcome: Completed/Met 01/27/2024 1715 by Loel Dubonnet, PT Flowsheets (Taken 01/13/2024 1850) LTG: Pt will ambulate in community environ  assist needed:: Supervision/Verbal cueing LTG: Ambulation distance in community environment: more than 400 ft using LRAD   Problem: RH Stairs Goal: LTG Patient will ambulate up and down stairs w/assist (PT) Description: LTG: Patient will ambulate up and down # of stairs with assistance (PT) 01/27/2024 1721 by Loel Dubonnet, PT Outcome: Completed/Met 01/27/2024 1715 by Loel Dubonnet, PT Flowsheets (Taken 01/13/2024 1850) LTG: Pt will ambulate up/down stairs assist needed:: Supervision/Verbal cueing LTG: Pt will  ambulate up and down number of stairs: at least 4 steps using HR setup as per home environment

## 2024-01-27 NOTE — Progress Notes (Signed)
 Inpatient Rehabilitation Care Coordinator Discharge Note DC 4/12  Patient Details  Name: Faith Jordan MRN: [id number] Date of Birth: 1995/12/22   Discharge location: HOME WITH PARENTS WHO CAN PROVIDE SUPERVISION LEVEL  Length of Stay: 16 DAYS  Discharge activity level: MOD/I-SUPERVISION LEVEL  Home/community participation: ACTIVE  Patient response IE:PPIRJJ Literacy - How often do you need to have someone help you when you read instructions, pamphlets, or other written material from your doctor or pharmacy?: Never  Patient response OA:CZYSAY Isolation - How often do you feel lonely or isolated from those around you?: Never  Services provided included: MD, RD, PT, OT, SLP, RN, CM, TR, Pharmacy, Neuropsych, SW  Financial Services:  Field seismologist Utilized: Private Insurance [employer]  Choices offered to/list presented to: PT AMD MOM  Follow-up services arranged:  Outpatient    Outpatient Servicies: [facility] [address] PT & OT WILL CALL TO SET UP FOLLOW UP APPOINTMENTS    NO DME NEEDS. E-STIM APPLICATION COMPLETED FOR HOME USE  Patient response to transportation need: Is the patient able to respond to transportation needs?: Yes In the past 12 months, has lack of transportation kept you from medical appointments or from getting medications?: No In the past 12 months, has lack of transportation kept you from meetings, work, or from getting things needed for daily living?: No   Patient/Family verbalized understanding of follow-up arrangements:  Yes  Individual responsible for coordination of the follow-up plan: SELF  [phone]  Confirmed correct DME delivered: Lucy Chris 01/27/2024    Comments (or additional information):PT DID WELL AND REACHED MOD/I LEVEL. HER MAIN GOAL IS TO BE ABLE TO RETURN TO WORK AS A ER-RN MOM WAS HERE FOR EDUCATION PRIOR TO DISCHARGE  Summary of Stay    Date/Time Discharge Planning CSW  01/25/24 3557 Mom  has been here will see if wants to do formal education this week. Discussing OP and await any equipment needs RGD  01/18/24 0843 Going home with parents who can provide 24/7 supervision. Neuro-psych to see. Doing well but has high expectations of herself RGD       Cyndi Montejano, Lemar Livings

## 2024-01-27 NOTE — Progress Notes (Signed)
 Occupational Therapy Discharge Summary  Patient Details  Name: Faith Jordan MRN: [id number] Date of Birth: [birth year]  Date of Discharge from OT service:January 27, 2024  Today's Date: 01/27/2024 OT Individual Time: 4098-1191 OT Individual Time Calculation (min): 42 min  OT Individual Time: 1300-1414 OT Individual Time Calculation (min): 74 min   Patient has met 13 of 13 long term goals due to improved activity tolerance, improved balance, postural control, ability to compensate for deficits, functional use of  LEFT upper and LEFT lower extremity, improved attention, improved awareness, and improved coordination.  Patient to discharge at overall Modified Independent level.  Patient's care partner is independent to provide the necessary physical assistance at discharge.    Reasons goals not met: All goals met  Recommendation:  Patient will benefit from ongoing skilled OT services in outpatient setting to continue to advance functional skills in the area of BADL, iADL, Vocation, and Reduce care partner burden.  Equipment: Pt's family purchased shower chair  Reasons for discharge: treatment goals met and discharge from hospital  Patient/family agrees with progress made and goals achieved: Yes  OT Discharge Skilled Therapeutic Interventions/Progress Updates:  AM Session:  Pt received semi-reclined in bed with parents present in room upon OT arrival. Pt presenting to be in good spirits receptive to skilled OT session reporting 0/10 pain- OT offering intermittent rest breaks, repositioning, and therapeutic support to optimize participation in therapy session. Focused this session on family education to increase Pt's safety and independence at d/c. Provided education on CVA etiology/recovery process with emphasis on L UE positioning, fatigue and increased need for rest following CVA, tone prevention, fall prevention, and energy conservation technique. Recommended Pt continue to participate in  BADLs and IADLs with family present to provided distant supervision for high level tasks such as showering, cooking, cleaning, and laundry. Pt inquiring about taking care of her dog at d/c- provided education that Pt needs to be sitting down when dog it reoriented to her and that she should slowly begin pet care tasks under supervision of family. Pt instructed to not take dog on walks or be left alone with dog when she returns home to avoid falls or injury with Pt receptive to education. Provided education on MD's recommendation for return to driving (see MD note for parameter) and discussed receiving additional insight from MD/therapists in OP setting to determine when Pt is able to safely return to living alone based on her progress with Pt and family receptive to plan. Education also provided on bed mobility, shower transfers, tub transfers, and DME options including tub assist hand rail and shower seat. Provided education on follow-up recommendations of OPOT services with intermittent supervision with Pt and Pt's family receptive to plan. Pt and Pt's family reporting all questions were answered at end of session. Pt was left resting in bed with call bell in reach,and all needs met. Pt made mod I in room this session with medical staff informed and in agreement.    PM Session:  Pt received sitting up in bed with family present in room. Pt presenting to be in good spirits receptive to skilled OT session reporting 0/10 pain- OT offering intermittent rest breaks, repositioning, and therapeutic support to optimize participation in therapy session. Focused this session on family education and HEP education to support improved functional outcomes and safety at d/c.   Pt completed functional mobility this session no AD mod I. Provided education on functional transfers including walk-in shower tranfers, tub transfers, and floor transfers. Education  and demonstration provided on technique with opportunities for  practice provided following. Spent time determining safest method for Pt to transfer out of bathtub as baths are meaningful to her and support her in stress management. With increased time and practice, Pt able to step over tub and transfer from sitting position in tub with supervision provided for safety. Educated Pt and family on bathroom safety and on importance of attempting transfer when environment is dry prior to taking full bath and then completing the transfer with Pt/family receptive to plan. Pt completed walk-in shower transfer and floor transfer x2 trials mod I.   Provided Pt with HEP to increase overall strength, endurance, and motor control for improved participation in BADLs, IADLs, and work related tasks. Issued Pt a handout with pictures and written instructions provided. Engaged Pt in completing each exercise to support learning with Pt demonstrating teach back as evidence of learning.  Exercises - Isometric Shoulder External Rotation at Wall  - 1 x daily - 7 x weekly - 2 sets - 10 reps - Isometric Shoulder Flexion at Wall  - 1 x daily - 7 x weekly - 2 sets - 10 reps - Standing Isometric Shoulder Internal Rotation at Doorway  - 1 x daily - 7 x weekly - 2 sets - 10 reps - Wall Clock  - 1 x daily - 7 x weekly - 3 sets - 10 reps - Standing Wall Ball Circles with Plyo Ball  - 1 x daily - 7 x weekly - 2 sets - 10 reps - Shoulder Circles on Wall with Towel  - 1 x daily - 7 x weekly - 2 sets - 10 reps - Wall Push Up  - 1 x daily - 7 x weekly - 2 sets - 10 reps - Standing Shoulder Flexion Wall Walk  - 1 x daily - 7 x weekly - 2 sets - 10 reps - Seated Scapular Retraction  - 1 x daily - 7 x weekly - 2 sets - 10 reps - Wall Angels  - 1 x daily - 7 x weekly - 2 sets - 10 reps - Cat Cow to Child's Pose  - 1 x daily - 7 x weekly - 2 sets - 10 reps - Bird Dog  - 1 x daily - 7 x weekly - 3 sets - 10 reps - Supine Bridge  - 1 x daily - 7 x weekly - 2 sets - 10 reps  Completed reassessment of Pt's  UE strength.  Grip: R-60# L-30# Lateral Pinch: R-18# L-8.9# Pt initially scored grip R-60# L-20# and grip R-19# L5# during assessment on 04/08 with significantly improved scores noted on L UE.   Completed reassessment of FAST-UL- see below documentation.   Pt was left resting in bed with call bell in reach and all needs met.    FAST-UL Outcome Measure  Hand-to-mouth (HtM) Movement Starting Position: Participant seated on a standard chair without armrests. Trunk leaning on back support of chair. Both hands placed in pronated position on the ipsilateral middle thigh. Feet placed flat on the floor. If participants have any difficulty in understanding instructions (i.e. aphasia) a visual demonstration is suggested. For each of the 5 tasks of the FAST-UL, the subject at first performs the movement with the less affected UL and then with the affected one. The movement can be repeated 3 times and the best score of the three attempts is assigned.   Instructions: Each subject is asked to move the hand towards the  mouth, touch it with fingertips and return to the thigh. Motor task occurs without moving the trunk off the back support and without moving the head toward the hand.   Scoring: Clinical score from 0 to 3 is provided by comparing affected side with less affected one as follows: 0 = no movement at all. 1 = The movement task is not completed (less of 50% of the contralateral HtM movement). 2 = The movement task is not completed (more of 50% of the contralateral HtM movement but the mouth is not reached) or the movement task is completed with compensations. If the mouth is touched with the wrist or the palm or the movement is performed with head or trunk compensations (flexion of the head and trunk towards the hand) the score is 2.   3 = movement carried out at 100% of the contralateral HtM movement. HtM occurs with adequate shoulder flexion and abduction, elbow flexion, and forearm supination.  The mouth is touched with fingertips.  Patient Score: 3   Reach to Target (RtT) Movement Starting Position: Same starting conditions of HtM movement. Instructions: Each subject is asked to move the hand toward a target (i.e. the hand of the examiner) located in front of the subject in the ipsilateral workspace at shoulder height, at a distance corresponding to 100% of the fully extended UL within arm's reach (less affected arm as reference). Participants have to reach, touch the target, and return. Motor task occurs without moving the trunk off the back support. Scoring: Clinical score from 0 to 3 is provided by comparing affected side with less affected one as follows: 0 = no movement at all. 1 = The movement task is not completed (less of 50% of the contralateral RtT movement).  2 = The movement task is not completed (more of 50% of the contralateral RtT movement but the target is not reached) or the movement task is completed with compensations (i.e. the trunk loses contact with the back support of the chair with forward displacement, shoulder flexion occurs with excessive scapular elevation, or shoulder excessive abduction). If the target is reached with trunk or shoulder compensations for inadequate elbow and finger extension the score is 2.  3 = movement performed at 100% of the contralateral RtT. The target is reached with adequate shoulder flexion, elbow, wrist and finger extension.  Patient Score: 3   Prono-supination (PS) Movement Starting Position: Same starting conditions of HtM movement. Instructions: Motor task occurs without moving the trunk anteriorly or laterally, the medial side of the humerus is against the body, the forearm is fully pronated with the hand resting on the thigh. Scoring: Clinical score from 0 to 3 is provided by comparing paretic side with less affected one as follows: 0 = no movement at all. 1 = The movement task is not completed (less of 50% of the  contralateral PS movement).  2 = The movement task is not completed (more of 50% of the contralateral PS movement but the forearm is not fully supinated) or the movement task is completed with compensations (i.e. excessive trunk inclination, shoulder abduction). If the movement is completed with compensations at elbow, shoulder or trunk level the score is 2. 3 = movement performed at 100% of the contralateral PS (complete supination of the forearm with the dorsal part of the hand in contact with the thigh).   Patient Score: 3   Grasp and Release (GaR) Movement Starting position: Participant seated on a standard chair. Hip and knees in 90  flexion, feet flat on the floor. Upper limb (UL) resting on a table in front of the participant with approximately 90 elbow flexion, forearm pronated and fingers in a relaxed extended and adducted position.  Instructions: The subject performs a grasping movement of a cylindrical rigid glass (at least 6 cm diameter) placed proximally to an imaginary line connecting the distal joints of thumb and index finger. The subject is asked to grasp the glass, lift it at least 2 cm (elbow remains in contact with the table), and release it. Scoring:  Clinical score from 0 to 3 is provided by comparing affected side with less affected one as follows: 0 = No movement. The grasp is not possible. 1 = The movement task is not completed (less of 50% of the task). Some prehension is possible but the grasp is not sufficiently stable to lift the object; the grasp can be performed with the use of the less affected hand only to stabilize the glass for inadequate hand/finger opening and the release is not possible. Some hand opening is required otherwise the score is 0. 2 = The movement task is not completed (more of 50% of the task). The object is grasped and lifted but it falls or the task is completed using alternative grasping strategies (i.e. multi-pulpar, palmar, digito palmar;  grasping and releasing of the object is possible with abnormal orientation of the wrist and fingers toward the object and the forearm is lifted off the table). 3 = The task is completed using the expected pattern (normal orientation of fingers or wrist toward the object, the grasp occurs with thumb and fingers in opposition, forearm supination, elbow flexion; thumb abduction and finger extension to release the object).  Patient Score: 3   Pinch and Release (PaR) Movement Starting position: Same starting conditions of GaR movement The participant performs a PaR movement of a pen placed on a table in the midline of an imaginary line connecting the distal joints of thumb and index finger. The participants asked to pinch the pen with the tips of thumb and index finger, lift it at least 2 cm (elbow remains in contact with the table), and release it. Clinical score from 0 to 3 is provided by comparing affected side with non-affected one as follows: 0 = No movement. The pinch is not possible. 1 = The movement task is not completed (less of 50% of the task). Some prehension is possible but the pinch is not sufficiently stable to lift the object; the pinch occurs with the use of the less affected hand to stabilize the object for inadequate finger opening and the release is not possible. Some fingers movement is required otherwise the score is 0. 2 = The movement task is not completed (more of 50% of the task). The object is pinched and lifted but it falls or the task is completed using alternative pinching strategies (e.g. pinching with all the fingers, tripod pinch, pinching and releasing of the object is possible with abnormal orientation of fingers and wrist toward the object and the forearm is lifted off the table). 3 = The task is completed using the expected pattern (normal orientation of fingers or wrist toward the object, the pinch occurs with opposition of pads of index finger and thumb, and wrist  extension).  Patient Score: 3  Total score: 15/15   Precautions/Restrictions  Precautions Precautions: Fall Precaution/Restrictions Comments: L hemipareisis Restrictions Weight Bearing Restrictions Per Provider Order: No Vital Signs Therapy Vitals Temp: 97.9 F (36.6 C) Pulse  Rate: 63 Resp: 18 BP: (!) 103/57 Patient Position (if appropriate): Lying Oxygen Therapy SpO2: 98 % O2 Device: Room Air Pain Pain Assessment Pain Scale: 0-10 Pain Score: 0-No pain ADL ADL Eating: Independent Where Assessed-Eating: Chair, Edge of bed Grooming: Modified independent Where Assessed-Grooming: Standing at sink Upper Body Bathing: Modified independent Where Assessed-Upper Body Bathing: Shower Lower Body Bathing: Modified independent Where Assessed-Lower Body Bathing: Shower Upper Body Dressing: Modified independent (Device) Where Assessed-Upper Body Dressing: Edge of bed Lower Body Dressing: Modified independent Where Assessed-Lower Body Dressing: Edge of bed Toileting: Modified independent Where Assessed-Toileting: Teacher, adult education: Engineer, agricultural Method: Proofreader: Engineer, technical sales: Close supervison Film/video editor: Cytogeneticist Method: Designer, industrial/product: Grab bars Vision Baseline Vision/History: 0 No visual deficits Perception  Perception: Within Financial controller Praxis: Impaired Praxis Impairment Details: Motor planning Praxis-Other Comments: Mild motor planning deficit, signficant improvement from inital eval Cognition Cognition Overall Cognitive Status: Within Functional Limits for tasks assessed Arousal/Alertness: Awake/alert Orientation Level: Person;Place;Situation Person: Oriented Place: Oriented Situation: Oriented Memory: Appears intact Attention: Alternating;Selective Sustained Attention: Appears intact Selective  Attention: Appears intact Awareness: Appears intact Problem Solving: Appears intact Safety/Judgment: Appears intact Brief Interview for Mental Status (BIMS) Repetition of Three Words (First Attempt): 3 Temporal Orientation: Year: Correct Temporal Orientation: Month: Accurate within 5 days Temporal Orientation: Day: Correct Recall: "Sock": Yes, no cue required Recall: "Blue": Yes, no cue required Recall: "Bed": Yes, no cue required BIMS Summary Score: 15 Sensation Sensation Light Touch: Appears Intact Hot/Cold: Appears Intact Proprioception: Appears Intact Coordination Gross Motor Movements are Fluid and Coordinated: No Fine Motor Movements are Fluid and Coordinated: No Coordination and Movement Description: mild balance and coordination deficit 2/2 L hemiplegia, improvement from intial eval Finger Nose Finger Test: Smooth equal movements on R, slowed motor movments with increased effort required to complete on L Motor  Motor Motor: Hemiplegia Motor - Discharge Observations: mild balance and coordination deficits 2/2 LE hemiplegia with signgicant imrpovement from inital eval Mobility  Bed Mobility Bed Mobility: Rolling Right;Rolling Left;Supine to Sit;Sit to Supine Rolling Right: Independent with assistive device;Independent Rolling Left: Independent with assistive device;Independent Supine to Sit: Independent Sit to Supine: Independent Transfers Sit to Stand: Independent Stand to Sit: Independent  Trunk/Postural Assessment  Cervical Assessment Cervical Assessment: Within Functional Limits Thoracic Assessment Thoracic Assessment: Within Functional Limits Lumbar Assessment Lumbar Assessment: Within Functional Limits Postural Control Postural Control: Deficits on evaluation Righting Reactions: delayed, however improved from eval Protective Responses: delayed, however improved from eval  Balance Balance Balance Assessed: Yes Static Sitting Balance Static Sitting -  Balance Support: Feet supported Static Sitting - Level of Assistance: 7: Independent Dynamic Sitting Balance Dynamic Sitting - Balance Support: Feet supported Dynamic Sitting - Level of Assistance: 7: Independent Static Standing Balance Static Standing - Balance Support: During functional activity Static Standing - Level of Assistance: 7: Independent Dynamic Standing Balance Dynamic Standing - Balance Support: During functional activity Dynamic Standing - Level of Assistance: 6: Modified independent (Device/Increase time) Extremity/Trunk Assessment RUE Assessment RUE Assessment: Within Functional Limits LUE Assessment LUE Assessment: Exceptions to [facility] Passive Range of Motion (PROM) Comments: WFL Active Range of Motion (AROM) Comments: Mild decrease shoulder flexion and abduction with effortful performance, however signficnat improvement from inital eval General Strength Comments: Grip 4-/5, digit extension 4-/5, wrist flex/extion 4-/5, bicep/tricep 4/5, scapular protraction/retraction 4-/5, shoulder flexion/abduction 3/5   Tollie Pizza [facility] 01/27/2024, 8:43 AM

## 2024-01-27 NOTE — Plan of Care (Signed)
  Problem: RH Balance Goal: LTG Patient will maintain dynamic standing with ADLs (OT) Description: LTG:  Patient will maintain dynamic standing balance with assist during activities of daily living (OT)  Outcome: Completed/Met   Problem: Sit to Stand Goal: LTG:  Patient will perform sit to stand in prep for activites of daily living with assistance level (OT) Description: LTG:  Patient will perform sit to stand in prep for activites of daily living with assistance level (OT) Outcome: Completed/Met   Problem: RH Grooming Goal: LTG Patient will perform grooming w/assist,cues/equip (OT) Description: LTG: Patient will perform grooming with assist, with/without cues using equipment (OT) Outcome: Completed/Met   Problem: RH Bathing Goal: LTG Patient will bathe all body parts with assist levels (OT) Description: LTG: Patient will bathe all body parts with assist levels (OT) Outcome: Completed/Met   Problem: RH Dressing Goal: LTG Patient will perform upper body dressing (OT) Description: LTG Patient will perform upper body dressing with assist, with/without cues (OT). Outcome: Completed/Met Goal: LTG Patient will perform lower body dressing w/assist (OT) Description: LTG: Patient will perform lower body dressing with assist, with/without cues in positioning using equipment (OT) Outcome: Completed/Met   Problem: RH Toileting Goal: LTG Patient will perform toileting task (3/3 steps) with assistance level (OT) Description: LTG: Patient will perform toileting task (3/3 steps) with assistance level (OT)  Outcome: Completed/Met   Problem: RH Functional Use of Upper Extremity Goal: LTG Patient will use RT/LT upper extremity as a (OT) Description: LTG: Patient will use right/left upper extremity as a stabilizer/gross assist/diminished/nondominant/dominant level with assist, with/without cues during functional activity (OT) Outcome: Completed/Met   Problem: RH Simple Meal Prep Goal: LTG Patient  will perform simple meal prep w/assist (OT) Description: LTG: Patient will perform simple meal prep with assistance, with/without cues (OT). Outcome: Completed/Met   Problem: RH Laundry Goal: LTG Patient will perform laundry w/assist, cues (OT) Description: LTG: Patient will perform laundry with assistance, with/without cues (OT). Outcome: Completed/Met   Problem: RH Toilet Transfers Goal: LTG Patient will perform toilet transfers w/assist (OT) Description: LTG: Patient will perform toilet transfers with assist, with/without cues using equipment (OT) Outcome: Completed/Met   Problem: RH Tub/Shower Transfers Goal: LTG Patient will perform tub/shower transfers w/assist (OT) Description: LTG: Patient will perform tub/shower transfers with assist, with/without cues using equipment (OT) Outcome: Completed/Met   Problem: RH Attention Goal: LTG Patient will demonstrate this level of attention during functional activites (OT) Description: LTG:  Patient will demonstrate this level of attention during functional activites  (OT) Outcome: Completed/Met   Problem: RH Furniture Transfers Goal: LTG Patient will perform furniture transfers w/assist (OT/PT) Description: LTG: Patient will perform furniture transfers  with assistance (OT/PT). Outcome: Completed/Met

## 2024-01-27 NOTE — Progress Notes (Signed)
 Physical Therapy Discharge Summary  Patient Details  Name: Faith Jordan MRN: [id number] Date of Birth: 10/15/1996  Date of Discharge from PT service:January 27, 2024  Today's Date: 01/27/2024 PT Individual Time: 4098-1191 PT Individual Time Calculation (min): 44 min    Patient has met 9 of 9 long term goals due to improved activity tolerance, improved balance, increased strength, functional use of  left upper extremity and left lower extremity, improved attention, and improved coordination.  Patient to discharge at an ambulatory level Supervision.   Patient's care partner is independent to provide the necessary physical assistance at discharge.  Reasons goals not met: n/a  Recommendation:  Patient will benefit from ongoing skilled PT services in neuro outpatient setting to continue to advance safe functional mobility, address ongoing impairments in strength, coordination, balance, activity tolerance, safety awareness, and to minimize fall risk.  Equipment: No equipment provided  Reasons for discharge: treatment goals met and discharge from hospital  Patient/family agrees with progress made and goals achieved: Yes  PT Discharge Precautions/Restrictions Precautions Precautions: Fall Precaution/Restrictions Comments: L hemipareisis UE>LE Restrictions Weight Bearing Restrictions Per Provider Order: No Vital Signs   Pain Pain Assessment Pain Scale: 0-10 Pain Score: 0-No pain Pain Interference Pain Interference Pain Effect on Sleep: 0. Does not apply - I have not had any pain or hurting in the past 5 days Pain Interference with Therapy Activities: 0. Does not apply - I have not received rehabilitationtherapy in the past 5 days Pain Interference with Day-to-Day Activities: 1. Rarely or not at all Vision/Perception  Vision - History Ability to See in Adequate Light: 0 Adequate Perception Perception: Within Functional Limits  Cognition Overall Cognitive Status: Within  Functional Limits for tasks assessed Arousal/Alertness: Awake/alert Attention: Alternating;Selective Sustained Attention: Appears intact Sustained Attention Impairment: Verbal complex;Functional complex Selective Attention: Appears intact Memory: Appears intact Awareness: Appears intact Problem Solving: Appears intact Problem Solving Impairment: Verbal complex;Functional complex Safety/Judgment: Appears intact Sensation Sensation Light Touch: Appears Intact Hot/Cold: Appears Intact Proprioception: Appears Intact Coordination Gross Motor Movements are Fluid and Coordinated: No Fine Motor Movements are Fluid and Coordinated: No Coordination and Movement Description: mild balance and coordination deficit 2/2 L hemiplegia, significant improvement from intial eval Finger Nose Finger Test: Smooth equal movements on R, slowed motor movments with increased effort required to complete on L Heel Shin Test: BLE WFL Motor  Motor Motor: Other (comment) (hemipareisis) Motor - Discharge Observations: mild balance and coordination deficits 2/2 L hemipareisis with significant improvement from inital eval  Mobility Bed Mobility Bed Mobility: Rolling Right;Rolling Left;Supine to Sit;Sit to Supine Rolling Right: Independent with assistive device;Independent Rolling Left: Independent with assistive device;Independent Supine to Sit: Independent Sit to Supine: Independent Transfers Transfers: Stand to Sit;Sit to Stand;Stand Pivot Transfers Sit to Stand: Independent Stand to Sit: Independent Stand Pivot Transfers: Independent Transfer (Assistive device): None Locomotion  Gait Ambulation: Yes Gait Assistance: Supervision/Verbal cueing (Distant supervision) Gait Distance (Feet): 1000 Feet Assistive device: None Gait Assistance Details: Verbal cues for technique Gait Gait: Yes Gait Pattern: Impaired Gait Pattern: Step-through pattern;Decreased stance time - left;Decreased weight shift to  left;Left flexed knee in stance Ankle - Stance Phase - Impaired Gait Pattern: Decreased push off/heel off - Left Knee - Stance Phase - Impaired Gait Pattern: Extensor thrust - Left;Increased knee flexion - Left (significant improvement in extensor thrust since initial eval with only rare instances and especially when fatigued) Pelvis - Stance Phase - Impaired Gait Pattern: Decreased weight shift - Left Gait velocity: decreased Stairs / Additional Locomotion  Stairs: Yes Stairs Assistance: Supervision/Verbal cueing Stair Management Technique: No rails;Forwards Number of Stairs: 12 Height of Stairs: 6 Ramp: Supervision/Verbal cueing Curb: Supervision/Verbal cueing Pick up small object from the floor assist level: Independent Wheelchair Mobility Wheelchair Mobility: No  Trunk/Postural Assessment  Cervical Assessment Cervical Assessment: Within Functional Limits Thoracic Assessment Thoracic Assessment: Exceptions to [facility] (roundd shoulders) Lumbar Assessment Lumbar Assessment: Exceptions to [facility] (lumbar WFL; obliquity with L depressed, potential LLD) Postural Control Postural Control: Deficits on evaluation Righting Reactions: delayed, however significantly improved from eval Protective Responses: delayed, however significantly improved from eval  Balance Balance Balance Assessed: Yes Standardized Balance Assessment Standardized Balance Assessment: Berg Balance Test;Dynamic Gait Index Berg Balance Test Sit to Stand: Able to stand without using hands and stabilize independently Standing Unsupported: Able to stand safely 2 minutes Sitting with Back Unsupported but Feet Supported on Floor or Stool: Able to sit safely and securely 2 minutes Stand to Sit: Sits safely with minimal use of hands Transfers: Able to transfer safely, minor use of hands Standing Unsupported with Eyes Closed: Able to stand 10 seconds safely Standing Ubsupported with Feet Together: Able to place feet together  independently and stand 1 minute safely From Standing, Reach Forward with Outstretched Arm: Can reach confidently >25 cm (10") From Standing Position, Pick up Object from Floor: Able to pick up shoe safely and easily From Standing Position, Turn to Look Behind Over each Shoulder: Looks behind from both sides and weight shifts well Turn 360 Degrees: Able to turn 360 degrees safely in 4 seconds or less Standing Unsupported, Alternately Place Feet on Step/Stool: Able to stand independently and safely and complete 8 steps in 20 seconds Standing Unsupported, One Foot in Front: Able to place foot tandem independently and hold 30 seconds Standing on One Leg: Able to lift leg independently and hold > 10 seconds Total Score: 56 Dynamic Gait Index Level Surface: Normal Change in Gait Speed: Normal Gait with Horizontal Head Turns: Normal Gait with Vertical Head Turns: Normal Gait and Pivot Turn: Normal Step Over Obstacle: Mild Impairment Step Around Obstacles: Normal Steps: Normal Total Score: 23 Static Sitting Balance Static Sitting - Balance Support: Feet supported Static Sitting - Level of Assistance: 7: Independent Dynamic Sitting Balance Dynamic Sitting - Balance Support: Feet supported Dynamic Sitting - Level of Assistance: 7: Independent Static Standing Balance Static Standing - Balance Support: During functional activity;No upper extremity supported Static Standing - Level of Assistance: 7: Independent Dynamic Standing Balance Dynamic Standing - Balance Support: During functional activity;No upper extremity supported Dynamic Standing - Level of Assistance: 6: Modified independent (Device/Increase time) Extremity Assessment  RUE Assessment RUE Assessment: Within Functional Limits LUE Assessment LUE Assessment: Exceptions to [facility] Passive Range of Motion (PROM) Comments: WFL Active Range of Motion (AROM) Comments: Mild decrease shoulder flexion and abduction with effortful performance,  however signficnat improvement from inital eval General Strength Comments: Grip 4-/5, digit extension 4-/5, wrist flex/extion 4-/5, bicep/tricep 4/5, scapular protraction/retraction 4-/5, shoulder flexion/abduction 3/5 RLE Assessment RLE Assessment: Within Functional Limits LLE Assessment LLE Assessment: Exceptions to [facility] LLE Strength LLE Overall Strength: Deficits Left Hip Flexion: 4-/5 Left Hip Extension: 4-/5 Left Hip ABduction: 4-/5 Left Hip ADduction: 4/5 Left Knee Flexion: 4/5 Left Knee Extension: 4-/5 Left Ankle Dorsiflexion: 3+/5 Left Ankle Plantar Flexion: 3+/5  Skilled Intervention: PT instructed pt in Grad day assessment to measure progress toward goals. See below for details. CARETool mobility assessment  also completed; see CAREtool tab in navigator for details.  Patient supine in bed on  entrance to room. Patient alert and agreeable to PT session. Mother also present. Father not present as he completed family education with OT earlier in day and was fatigued from working early/ overnight.   Patient with no pain complaint at start of session.  Therapeutic Activity: Discussed pt awareness of self and need to relate any feelings of fatigue when up and moving around. Change position and either sit or lie down for fatigue and try to conserve energy with all activities.   Pt provided with below HEP and instructed in all therex:  Access Code: [email or web address] URL: [email or web address] Date: 01/27/2024 Prepared by: Correne Dillon  Exercises - Alternating Single Leg Bridge  - 2 x daily - 7 x weekly - 2 sets - 10 reps - Sidelying Hip Abduction with Resistance at Thighs  - 2 x daily - 7 x weekly - 2 sets - 10 reps - Single Leg Stance  - 1 x daily - 7 x weekly - 1 sets - 10 reps - up to 30 sec hold - Bird Dog with Knee Taps  - 2 x daily - 7 x weekly - 2 sets - 10 reps - 2-3 sec hold - Half Kneeling Lift/ Chop  - 1 x daily - 7 x weekly - 1 sets - 10 reps  Also instructed  pt in 2 additional exercises but no printed versions available: - tall kneeling to half kneeling - sit to half kneeling and then return to sit. Perform to each side.   Recommended neuro OPPT at [facility] on [address] in [place] with Lorita Rosa PT.   Patient supine in bed at end of session with brakes locked, no alarm set as pt is IND in room, and all needs within reach.   Donne Gage PT, DPT, CSRS 01/27/2024, 12:54 PM

## 2024-01-27 NOTE — Progress Notes (Addendum)
 PROGRESS NOTE   Subjective/Complaints:  Discussed no driving x 1 wk post d/c and then resume as follows: Graduated return to driving instructions were provided. It is recommended that the patient first drives with another licensed driver in an empty parking lot. If the patient does well with this, and they can drive on a quiet street with the licensed driver. If the patient does well with this they can drive on a busy street with a licensed driver. If the patient does well with this, the next time out they can go by himself. For the first month after resuming driving, I recommend no nighttime or Interstate driving.    Discussed no more than 1 etoh drink per day  (pt states she only drank occ)  Discussed rec gyn f/u to discuss alternatives to estrogen based OCs   ROS- see HPI. neg CP, SOB, abd pain, N/V/D  Objective:   No results found. No results for input(s): "WBC", "HGB", "HCT", "PLT" in the last 72 hours.   No results for input(s): "NA", "K", "CL", "CO2", "GLUCOSE", "BUN", "CREATININE", "CALCIUM" in the last 72 hours.   No intake or output data in the 24 hours ending 01/27/24 0805       Physical Exam: Vital Signs Blood pressure (!) 103/57, pulse 63, temperature 97.9 F (36.6 C), resp. rate 18, height 5\' 7"  (1.702 m), weight 75 kg, SpO2 98%.   General: No acute distress, resting in bed comfortably.  Mood and affect are appropriate, cheerful  Heart: Regular rate and rhythm no rubs murmurs or extra sounds Lungs: Clear to auscultation, breathing unlabored, no rales or wheezes Abdomen: Positive bowel sounds, soft nontender to palpation, nondistended Extremities: No clubbing, cyanosis, or edema Skin: No evidence of breakdown, no evidence of rash over exposed surfaces.   Neuro: A&O x3   Neurologic: Cranial nerves II through XII intact, motor strength is 5/5 in right and 3/5 left deltoid, bicep, tricep, grip, 5/5 RIght  and 4- left hip flexor, knee extensors, 5/5 right and 3- left ankle dorsiflexor and plantar flexor Has weak left 3 point chuck  Motor control - able to oppose Left thumb to all 4 fingers  Mild dysmetria L FNF Musculoskeletal: Full passive range of motion in all 4 extremities. No joint swelling     Assessment/Plan: 1. Functional deficits which require 3+ hours per day of interdisciplinary therapy in a comprehensive inpatient rehab setting. Physiatrist is providing close team supervision and 24 hour management of active medical problems listed below. Physiatrist and rehab team continue to assess barriers to discharge/monitor patient progress toward functional and medical goals  Care Tool:  Bathing    Body parts bathed by patient: Left arm, Chest, Abdomen, Front perineal area, Buttocks, Right upper leg, Left upper leg, Face, Right lower leg, Left lower leg, Right arm   Body parts bathed by helper: Right arm, Right lower leg, Left lower leg     Bathing assist Assist Level: Supervision/Verbal cueing     Upper Body Dressing/Undressing Upper body dressing   What is the patient wearing?: Pull over shirt    Upper body assist Assist Level: Set up assist    Lower Body Dressing/Undressing Lower body  dressing      What is the patient wearing?: Pants     Lower body assist Assist for lower body dressing: Supervision/Verbal cueing     Toileting Toileting    Toileting assist Assist for toileting: Supervision/Verbal cueing     Transfers Chair/bed transfer  Transfers assist     Chair/bed transfer assist level: Supervision/Verbal cueing     Locomotion Ambulation   Ambulation assist      Assist level: Minimal Assistance - Patient > 75% Assistive device: No Device Max distance: 250 ft   Walk 10 feet activity   Assist     Assist level: Supervision/Verbal cueing Assistive device: No Device   Walk 50 feet activity   Assist    Assist level: Supervision/Verbal  cueing Assistive device: No Device    Walk 150 feet activity   Assist    Assist level: Contact Guard/Touching assist Assistive device: No Device    Walk 10 feet on uneven surface  activity   Assist Walk 10 feet on uneven surfaces activity did not occur: Safety/medical concerns         Wheelchair     Assist Is the patient using a wheelchair?: No Type of Wheelchair: Manual Wheelchair activity did not occur: Refused         Wheelchair 50 feet with 2 turns activity    Assist    Wheelchair 50 feet with 2 turns activity did not occur: Refused       Wheelchair 150 feet activity     Assist  Wheelchair 150 feet activity did not occur: Refused       Blood pressure (!) 103/57, pulse 63, temperature 97.9 F (36.6 C), resp. rate 18, height 5\' 7"  (1.702 m), weight 75 kg, SpO2 98%.  Medical Problem List and Plan: 1. 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             -patient may  shower             -ELOS/Goals: 4/12 days- supervision- Mod I             Outpt PT, OT Will stay with parents x 1 wk post d/c then to her own place  Do not anticipate RTW for several months 2.  Antithrombotics: -DVT/anticoagulation:  Pharmaceutical: Lovenox 40mg  daily -antiplatelet therapy: Plavix 75 mg daily.  Patient with allergy to NSAIDs thus was not placed on aspirin 3. Pain Management: Lido Derm patch 4. Mood/Behavior/Sleep: Prozac 40 mg daily, melatonin 5 mg nightly, Ativan 1 mg every 6 hours as needed anxiety, trazodone 100mg  nightly             -antipsychotic agents: N/A 5. Neuropsych/cognition: This patient is capable of making decisions on her own behalf. 6. Skin/Wound Care: Routine skin check 7. Fluids/Electrolytes/Nutrition: Continue sodium bicarb 650mg  TID. Routine in and  outs with follow-up chemistries Pt states she has always had low CO2 levels , did not take bicarb at home, now has low K+ no diuretics, suspect due to bicarb supplementation - d/c bicarb , give 1x dose of KCL and monitor   Recheck still low will need daily KCL recheck 4/11    Latest Ref Rng & Units 01/23/2024    5:25 AM 01/18/2024    5:25 AM 01/16/2024    5:27 AM  BMP  Glucose 70 - 99 mg/dL  91  88  92   BUN 6 - 20 mg/dL 7  8  6    Creatinine 0.44 - 1.00 mg/dL 1.61  0.96  0.45   Sodium 135 - 145 mmol/L 140  140  142   Potassium 3.5 - 5.1 mmol/L 3.3  3.4  3.4   Chloride 98 - 111 mmol/L 111  109  109   CO2 22 - 32 mmol/L 21  21  22    Calcium 8.9 - 10.3 mg/dL 9.0  8.9  9.1    8.  Current History of hemiplegic migraines.  Followed by neurology service Dr. Lucia Gaskins.  Continue Topamax 50mg  daily +50mg  daily PRN- per pt, took off Emgality (which is due ~3/28) as well as Nurtec- No HAs since rehab admission May use prn fioricet but has not required  9.  Hyperlipidemia.  Lipitor 80mg  daily 10. 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 Bowel management: continue fibercon and acidophilus, imodium 1mg  q6h PRN, miralax daily PRN    LOS: 15 days A FACE TO FACE EVALUATION WAS PERFORMED  Erick Colace 01/27/2024, 8:05 AM

## 2024-01-27 NOTE — Progress Notes (Signed)
 Recreational Therapy Discharge Summary Patient Details  Name: Faith Jordan MRN: [id number] Date of Birth: 1996/07/20 Today's Date: 01/27/2024  Comments on progress toward goals: Pt has made excellent progress during LOS and is excited about d/c home tomorrow at overall supervision/mod I ambulatory level.    TR sessions focused on pt education in regards to leisure education, activity analysis/modifications & community reintegration.  Pt participated in a simulated outing at overall supervision level, identifying and negotiating obstacles and completing mobility tasks on various outdoor surfaces.  Also provided education on safe pet interactions to decrease risk of falling.  Pt stated understanding.  Reasons for discharge: discharge from hospital  Follow-up: Outpatient  Patient/family agrees with progress made and goals achieved: Yes  Faith Jordan 01/27/2024, 9:27 AM

## 2024-01-27 NOTE — Progress Notes (Signed)
 Inpatient Rehabilitation Discharge Medication Review by a Pharmacist  A complete drug regimen review was completed for this patient to identify any potential clinically significant medication issues.  High Risk Drug Classes Is patient taking? Indication by Medication  Antipsychotic No   Anticoagulant No   Antibiotic No   Opioid No   Antiplatelet Yes Plavix - secondary ppx stroke  Hypoglycemics/insulin No   Vasoactive Medication No   Chemotherapy No   Other Yes Atorvastatin - HLD Fluoxetine - anxiety Trazodone - insomnia Lorazepam - prn anxiety Topiramate - familial hemiplegic migraine     Type of Medication Issue Identified Description of Issue Recommendation(s)  Drug Interaction(s) (clinically significant)     Duplicate Therapy     Allergy     No Medication Administration End Date     Incorrect Dose     Additional Drug Therapy Needed     Significant med changes from prior encounter (inform family/care partners about these prior to discharge).    Other       Clinically significant medication issues were identified that warrant physician communication and completion of prescribed/recommended actions by midnight of the next day:  No  Name of provider notified for urgent issues identified:   Provider Method of Notification:     Pharmacist comments:   Time spent performing this drug regimen review (minutes):  30   Lilly Cove 01/27/2024 9:40 AM

## 2024-01-27 NOTE — Progress Notes (Signed)
 Physical Therapy Session Note  Patient Details  Name: Faith Jordan MRN: [id number] Date of Birth: 1996/05/04  Today's Date: 01/27/2024 PT Individual Time: 0848-1000 PT Individual Time Calculation (min): 72 min   Short Term Goals: Week 2:  PT Short Term Goal 1 (Week 2): STG = LTG d/t ELOS   Skilled Therapeutic Interventions/Progress Updates:  Patient supine in bed on entrance to room. Patient alert and agreeable to PT session.   Patient with no pain complaint at start of session.  Therapeutic Activity: Bed Mobility: Pt performed supine <> sit with IND.  Transfers: Pt performed sit<>stand and stand pivot transfers throughout session with IND.   Pt's overall quality of movement continues to indicate L sided weakness  and pelvic obliquity, but overall LLE movements are significantly improved in motor control and coordination.   Neuromuscular Re-ed/ Gait Training:  Pt ambulated throughout rehab department >369ft/ bout using no AD with Mod I requiring extra time.  Demonstrated significant improvement in overall knee control in LLE. Continued low foot clearance with LLE with intermittent scuff of foot especially with fatigue. Pt's lower foot clearance may be d/t LLD and pelvic obliquity with L hip lower than R. Demos improvement in ability to change pace, resist perturbations.   NMR facilitated during session with focus on standing balance and dynamic gait. Pt guided in reassessment of Berg Balance Test and in Dynamic Gait Index. See full test below.   Patient demonstrates significantly improved fall risk as noted by score of  56 /56 on Berg Balance Scale.  (<36= high risk for falls, close to 100%; 37-45 significant >80%; 46-51 moderate >50%; 52-55 lower >25%)  Patient demonstrates improved fall risk as noted by score of 23/ 24 on  Dynamic Gait Index.    (Interpretation  < 19/24 = predictive of falls in the elderly,   > 22/24 = safe ambulators)  Guided pt in lateral (step-out) squats with  LLE stepping to Airex pad.   Next progressed to controlled ROM into/ out of TKE on LLE. Pt performs well and with improved control into and out of TKE. Then TKE blocked and smooth movements noted into/ out of extension.   Pt provided with resistance at hips for return trip to room. Pt's demos good reactive balance with stepping strategy used as needed for unexpected perturbations and resistance at gait belt for "slip-trips".   NMR performed for improvements in motor control and coordination, balance, sequencing, judgement, and self confidence/ efficacy in performing all aspects of mobility at highest level of independence.   Patient supine in bed at end of session with brakes locked, no alarm set, and all needs within reach.   Therapy Documentation Precautions:  Precautions Precautions: Fall Precaution/Restrictions Comments: L hemipareisis UE>LE Restrictions Weight Bearing Restrictions Per Provider Order: No  Pain: Pain Assessment Pain Scale: 0-10 Pain Score: 0-No pain  Balance: Balance Balance Assessed: Yes Standardized Balance Assessment Standardized Balance Assessment: Berg Balance Test;Dynamic Gait Index Berg Balance Test Sit to Stand: Able to stand without using hands and stabilize independently Standing Unsupported: Able to stand safely 2 minutes Sitting with Back Unsupported but Feet Supported on Floor or Stool: Able to sit safely and securely 2 minutes Stand to Sit: Sits safely with minimal use of hands Transfers: Able to transfer safely, minor use of hands Standing Unsupported with Eyes Closed: Able to stand 10 seconds safely Standing Ubsupported with Feet Together: Able to place feet together independently and stand 1 minute safely From Standing, Reach Forward with Outstretched Arm:  Can reach confidently >25 cm (10") From Standing Position, Pick up Object from Floor: Able to pick up shoe safely and easily From Standing Position, Turn to Look Behind Over each Shoulder:  Looks behind from both sides and weight shifts well Turn 360 Degrees: Able to turn 360 degrees safely in 4 seconds or less Standing Unsupported, Alternately Place Feet on Step/Stool: Able to stand independently and safely and complete 8 steps in 20 seconds Standing Unsupported, One Foot in Front: Able to place foot tandem independently and hold 30 seconds Standing on One Leg: Able to lift leg independently and hold > 10 seconds Total Score: 56 Dynamic Gait Index Level Surface: Normal Change in Gait Speed: Normal Gait with Horizontal Head Turns: Normal Gait with Vertical Head Turns: Normal Gait and Pivot Turn: Normal Step Over Obstacle: Mild Impairment Step Around Obstacles: Normal Steps: Normal Total Score: 23   Therapy/Group: Individual Therapy  Donne Gage PT, DPT, CSRS 01/27/2024, 10:15 AM

## 2024-01-28 NOTE — Progress Notes (Signed)
 Inpatient Rehabilitation Discharge Medication Review by a Pharmacist  A complete drug regimen review was completed for this patient to identify any potential clinically significant medication issues.  High Risk Drug Classes Is patient taking? Indication by Medication  Antipsychotic No   Anticoagulant No   Antibiotic No   Opioid No   Antiplatelet Yes Plavix - secondary ppx stroke  Hypoglycemics/insulin No   Vasoactive Medication No   Chemotherapy No   Other Yes Atorvastatin - HLD Fluoxetine - anxiety Trazodone - insomnia Lorazepam - prn anxiety Topiramate - familial hemiplegic migraine     Type of Medication Issue Identified Description of Issue Recommendation(s)  Drug Interaction(s) (clinically significant)     Duplicate Therapy     Allergy     No Medication Administration End Date     Incorrect Dose     Additional Drug Therapy Needed     Significant med changes from prior encounter (inform family/care partners about these prior to discharge).    Other       Clinically significant medication issues were identified that warrant physician communication and completion of prescribed/recommended actions by midnight of the next day:  No  Name of provider notified for urgent issues identified:   Provider Method of Notification:     Pharmacist comments:   Time spent performing this drug regimen review (minutes):  30   Julieanne Hadsall A Rayleen Wyrick 01/28/2024 8:35 AM

## 2024-01-28 NOTE — Progress Notes (Signed)
 PROGRESS NOTE   Subjective/Complaints:  Pt ready to go home! Slept well, denies pain, LBM yesterday, urinating fine, no other concerns or questions.    ROS- see HPI. neg CP, SOB, abd pain, N/V/D  Objective:   No results found. No results for input(s): "WBC", "HGB", "HCT", "PLT" in the last 72 hours.   Recent Labs    01/27/24 1119  K 3.6      Intake/Output Summary (Last 24 hours) at 01/28/2024 1047 Last data filed at 01/27/2024 2100 Gross per 24 hour  Intake 480 ml  Output --  Net 480 ml         Physical Exam: Vital Signs Blood pressure (!) 102/52, pulse (!) 58, temperature 97.7 F (36.5 C), resp. rate 18, height 5\' 7"  (1.702 m), weight 75 kg, SpO2 100%.   General: No acute distress, resting in bed comfortably. Parents at bedside.  Mood and affect are appropriate, cheerful  Heart: Regular rate and rhythm no rubs murmurs or extra sounds Lungs: Clear to auscultation, breathing unlabored, no rales or wheezes Abdomen: Positive bowel sounds, soft nontender to palpation, nondistended Extremities: No clubbing, cyanosis, or edema Skin: No evidence of breakdown, no evidence of rash over exposed surfaces.   Neuro: A&O x3  PRIOR EXAMS: Neurologic: Cranial nerves II through XII intact, motor strength is 5/5 in right and 3/5 left deltoid, bicep, tricep, grip, 5/5 RIght and 4- left hip flexor, knee extensors, 5/5 right and 3- left ankle dorsiflexor and plantar flexor Has weak left 3 point chuck  Motor control - able to oppose Left thumb to all 4 fingers  Mild dysmetria L FNF Musculoskeletal: Full passive range of motion in all 4 extremities. No joint swelling     Assessment/Plan: 1. Functional deficits which require 3+ hours per day of interdisciplinary therapy in a comprehensive inpatient rehab setting. Physiatrist is providing close team supervision and 24 hour management of active medical problems listed  below. Physiatrist and rehab team continue to assess barriers to discharge/monitor patient progress toward functional and medical goals  Care Tool:  Bathing    Body parts bathed by patient: Left arm, Chest, Abdomen, Front perineal area, Buttocks, Right upper leg, Left upper leg, Face, Right lower leg, Left lower leg, Right arm   Body parts bathed by helper: Right arm, Right lower leg, Left lower leg     Bathing assist Assist Level: Independent with assistive device Assistive Device Comment: increased time using hemi-techniques   Upper Body Dressing/Undressing Upper body dressing   What is the patient wearing?: Pull over shirt    Upper body assist Assist Level: Independent with assistive device Assistive Device Comment: increased time using hemi-techniques  Lower Body Dressing/Undressing Lower body dressing      What is the patient wearing?: Pants     Lower body assist Assist for lower body dressing: Independent with assitive device Assistive Device Comment: increased time using hemi-techniques   Toileting Toileting    Toileting assist Assist for toileting: Independent with assistive device Assistive Device Comment: increased time   Transfers Chair/bed transfer  Transfers assist     Chair/bed transfer assist level: Independent with assistive device     Locomotion Ambulation  Ambulation assist      Assist level: Supervision/Verbal cueing Assistive device: No Device Max distance: 1000 ft   Walk 10 feet activity   Assist     Assist level: Independent Assistive device: No Device   Walk 50 feet activity   Assist    Assist level: Supervision/Verbal cueing Assistive device: No Device    Walk 150 feet activity   Assist    Assist level: Supervision/Verbal cueing Assistive device: No Device    Walk 10 feet on uneven surface  activity   Assist Walk 10 feet on uneven surfaces activity did not occur: Safety/medical concerns   Assist  level: Supervision/Verbal cueing Assistive device: Other (comment) (no device)   Wheelchair     Assist Is the patient using a wheelchair?: No (Pt is a functional ambulator) Type of Wheelchair: Manual Wheelchair activity did not occur: N/A         Wheelchair 50 feet with 2 turns activity    Assist    Wheelchair 50 feet with 2 turns activity did not occur: N/A       Wheelchair 150 feet activity     Assist  Wheelchair 150 feet activity did not occur: N/A       Blood pressure (!) 102/52, pulse (!) 58, temperature 97.7 F (36.5 C), resp. rate 18, height 5\' 7"  (1.702 m), weight 75 kg, SpO2 100%.  Medical Problem List and Plan: 1. 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             -patient may  shower             -ELOS/Goals: 4/12 days- supervision- Mod I             Outpt PT, OT Will stay with parents x 1 wk post d/c then to her own place  Do not anticipate RTW for several months -01/28/24 d/c home, meds reviewed.  2.  Antithrombotics: -DVT/anticoagulation:  Pharmaceutical: Lovenox 40mg  daily -antiplatelet therapy: Plavix 75 mg daily.  Patient with allergy to NSAIDs thus was not placed on aspirin 3. Pain Management: Lido Derm patch 4. Mood/Behavior/Sleep: Prozac 40 mg daily, melatonin 5 mg nightly, Ativan 1 mg every 6 hours as needed anxiety, trazodone 100mg  nightly             -antipsychotic agents: N/A 5. Neuropsych/cognition: This patient is capable of making decisions on her own behalf. 6. Skin/Wound Care: Routine skin check 7. Fluids/Electrolytes/Nutrition: Continue sodium bicarb 650mg  TID. Routine in and outs with follow-up chemistries Pt states she has always had low CO2 levels , did not take bicarb at home, now has low K+ no diuretics, suspect due to bicarb  supplementation - d/c bicarb , give 1x dose of KCL and monitor   Recheck still low will need daily KCL recheck 4/11 WNL    Latest Ref Rng & Units 01/27/2024   11:19 AM 01/23/2024    5:25 AM 01/18/2024    5:25 AM  BMP  Glucose 70 - 99 mg/dL  91  88   BUN 6 - 20 mg/dL  7  8   Creatinine 1.61 - 1.00 mg/dL  0.96  0.45   Sodium 409 - 145 mmol/L  140  140   Potassium 3.5 - 5.1 mmol/L 3.6  3.3  3.4   Chloride 98 - 111 mmol/L  111  109   CO2 22 - 32 mmol/L  21  21   Calcium 8.9 - 10.3 mg/dL  9.0  8.9    8.  Current History of hemiplegic migraines.  Followed by neurology service Dr. Tresia Fruit.  Continue Topamax 50mg  daily +50mg  daily PRN- per pt, took off Emgality (which is due ~3/28) as well as Nurtec- No HAs since rehab admission May use prn fioricet but has not required  9.  Hyperlipidemia.  Lipitor 80mg  daily 10. 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 Bowel management: continue fibercon and acidophilus, imodium 1mg  q6h PRN, miralax daily PRN  -01/28/24 regular BMs so far, continue outpatient monitoring 13. Contraception: advised she needs to seek OBGYN care to discuss contraceptive options given that she cannot use estrogen containing meds anymore.     LOS: 16 days A FACE TO FACE EVALUATION WAS PERFORMED  [address] 01/28/2024, 10:47 AM

## 2024-01-30 NOTE — Telephone Encounter (Signed)
 Looks like patient was discharged home over the weekend from inpatient rehab. Working her referral on d/c summary states to f/u with Dr. Tresia Fruit in 4 weeks. Dr. Wynelle Heather last note states "Follow-up with an outpatient stroke clinic in 2 months.  Follow-up with Dr. Tresia Fruit 1 to 2 weeks for migraine follow-up."  Please advise on when to get patient in for follow up, I see we have some FMLA forms to complete for her as well, thank you

## 2024-01-31 ENCOUNTER — Encounter: Payer: Self-pay | Admitting: Physical Therapy

## 2024-01-31 ENCOUNTER — Ambulatory Visit: Attending: Physical Medicine & Rehabilitation | Admitting: Physical Therapy

## 2024-01-31 DIAGNOSIS — R2689 Other abnormalities of gait and mobility: Secondary | ICD-10-CM | POA: Insufficient documentation

## 2024-01-31 DIAGNOSIS — R278 Other lack of coordination: Secondary | ICD-10-CM | POA: Diagnosis not present

## 2024-01-31 DIAGNOSIS — I69354 Hemiplegia and hemiparesis following cerebral infarction affecting left non-dominant side: Secondary | ICD-10-CM | POA: Insufficient documentation

## 2024-01-31 DIAGNOSIS — R29818 Other symptoms and signs involving the nervous system: Secondary | ICD-10-CM | POA: Diagnosis not present

## 2024-01-31 DIAGNOSIS — M6281 Muscle weakness (generalized): Secondary | ICD-10-CM | POA: Diagnosis not present

## 2024-01-31 DIAGNOSIS — R2681 Unsteadiness on feet: Secondary | ICD-10-CM | POA: Insufficient documentation

## 2024-01-31 DIAGNOSIS — R29898 Other symptoms and signs involving the musculoskeletal system: Secondary | ICD-10-CM | POA: Diagnosis not present

## 2024-01-31 NOTE — Therapy (Signed)
 OUTPATIENT PHYSICAL THERAPY NEURO EVALUATION   Patient Name: JANIELLE MITTELSTADT MRN: [id number] DOB:August 25, 1996, 28 y.o., female Today's Date: 01/31/2024   PCP: Doreene Nest, NP REFERRING PROVIDER: Erick Colace, MD  END OF SESSION:  PT End of Session - 01/31/24 0807     Visit Number 1    Number of Visits 13    Date for PT Re-Evaluation 03/20/24    Authorization Type Gretta Began    PT Start Time [phone]    PT Stop Time [phone]    PT Time Calculation (min) 47 min    Activity Tolerance Patient tolerated treatment well    Behavior During Therapy [facility] for tasks assessed/performed;Anxious             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   Acute flank pain 06/02/2022   Angio-edema 04/27/2016   Angioedema    Aquagenic angio-edema-urticaria    Asthma    no problems /not used recently   Dysautonomia [facility])    Dysrhythmia    sinus tachycardia   Fibroid    right breast, adenoma   Headache(784.0)    History of COVID-19 11/21/2020   Hives 12/22/2015   Pneumonia    hx  6th grade   Sensation of fullness in both ears 02/02/2023   Snoring 02/11/2022   Urinary frequency 02/12/2019   Vaginal yeast infection 06/02/2022   Vasculitis (HCC)    Past Surgical History:  Procedure Laterality Date   ADENOIDECTOMY     BREAST LUMPECTOMY Right    BREAST LUMPECTOMY Right    MASS EXCISION Right 10/07/2014   Procedure: EXCISION OF RIGHT BREAST MASS;  Surgeon: Avel Peace, MD;  Location: [facility] OR;  Service: General;  Laterality: Right;   TONSILLECTOMY AND ADENOIDECTOMY     TOOTH EXTRACTION     TRANSESOPHAGEAL ECHOCARDIOGRAM (CATH LAB) N/A 01/10/2024   Procedure: TRANSESOPHAGEAL ECHOCARDIOGRAM;  Surgeon: Jake Bathe, MD;  Location: [facility] INVASIVE CV LAB;  Service: Cardiovascular;  Laterality: N/A;   Patient Active Problem List   Diagnosis Date Noted   Coping style affecting medical condition 01/19/2024   Cerebrovascular accident  (CVA) of right basal ganglia (HCC) 01/12/2024   Stroke of right basal ganglia (HCC) 01/07/2024   Non-restorative sleep 05/26/2022   Sleep paralysis, 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   Vivid dream 05/26/2022   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   Excessive daytime sleepiness 05/26/2022   Sleep disturbance 03/17/2022   Panic attacks 02/11/2022   Vitamin B12 deficiency 07/30/2021   Vitamin D deficiency 07/30/2021   Family history of hypothyroidism 07/30/2021   GAD (generalized anxiety disorder) 03/18/2020   Fatigue 11/06/2019   Chronic back pain 07/06/2019   Preventative health care 04/27/2018   Migraines 04/25/2018   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   Anemia 01/04/2018   Headache disorder 12/29/2015   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   Sinus tachycardia 12/22/2015   Dysautonomia (HCC) 12/22/2015   Dysautonomia, familial (HCC) 04/22/2015   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   Neurocardiogenic syncope 04/06/2013   Intermittent palpitations 04/06/2013    ONSET DATE: 01/26/2024 (referral)   REFERRING DIAG:  I63.81 (ICD-10-CM) - Other cerebral infarction due to occlusion or stenosis of small artery    THERAPY DIAG:  Unsteadiness on feet  Muscle weakness (generalized)  Other abnormalities of gait and mobility  Other lack of coordination  Hemiplegia and hemiparesis following cerebral infarction affecting left non-dominant side (HCC)  Rationale for Evaluation and Treatment: Rehabilitation  SUBJECTIVE:                                                                                                                                                                                             SUBJECTIVE STATEMENT: Pt presents w/mother, not using AD. Having difficulty w/ADLs (washing hair, putting hair in pony tail, etc.). Pt reports she has poor endurance and difficulty lifting her LUE above her head. States she experienced some spasticity in her LLE when walking in  the cold weather. Denies falls. Not sleeping super well - having bad dreams.    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 No Pt reports occasional LUE pain   PRECAUTIONS: Fall  RED FLAGS: None   WEIGHT BEARING RESTRICTIONS: No  FALLS: Has patient fallen in last 6 months? No  LIVING ENVIRONMENT: Lives with: lives with their family Lives in: House/apartment Stairs: Yes: External: 4 steps; bilateral but cannot reach both Has following equipment at home: shower chair  PLOF: Independent  PATIENT GOALS: "Being normal again. Being able to go back to work and be independent"   OBJECTIVE:  Note: Objective measures were completed at Evaluation unless otherwise noted.  DIAGNOSTIC FINDINGS: MRI of brain on 01/07/24  IMPRESSION: 1. 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 2. Susceptibility related calcifications in the globus pallidus bilaterally. This likely reflects the sequela of chronic microvascular ischemia.  COGNITION: Overall cognitive status: Within functional limits for tasks assessed   SENSATION: Pt denies numbness/tingling in L hemibody   POSTURE: No Significant postural limitations  LOWER EXTREMITY ROM:     Active  Right Eval Left Eval  Hip flexion    Hip extension    Hip abduction    Hip adduction    Hip internal rotation    Hip external rotation    Knee flexion    Knee extension    Ankle dorsiflexion    Ankle plantarflexion    Ankle inversion    Ankle eversion     (Blank rows = not tested)  LOWER EXTREMITY MMT:  Tested in seated position   MMT Right Eval Left Eval  Hip flexion 5 4+  Hip extension    Hip abduction 5 5  Hip adduction 5 5  Hip internal rotation    Hip external rotation    Knee flexion 5 5  Knee extension 5 4+  Ankle  dorsiflexion 5 5  Ankle plantarflexion    Ankle inversion  Ankle eversion    (Blank rows = not tested)  BED MOBILITY:  Not tested  TRANSFERS: Sit to stand: Complete Independence  Assistive device utilized: None     Stand to sit: Complete Independence  Assistive device utilized: None      RAMP:  Not tested  CURB:  Not tested  GAIT: Gait pattern: step through pattern, decreased arm swing- Left, decreased step length- Right, decreased stance time- Left, decreased stride length, decreased hip/knee flexion- Left, lateral hip instability, and wide BOS Distance walked: Various clinic distances  Assistive device utilized: None Level of assistance: Modified independence Comments: Pt walks very guarded, as if she feels unstable. No instability noted other than single instance of L knee buckling which pt able to self-correct.    FUNCTIONAL TESTS:   [facility] PT Assessment - 01/31/24 0819       Transfers   Five time sit to stand comments  9.37s   no UE support     Ambulation/Gait   Gait velocity 32.8' over 10.72s = 3.06 ft/s   No AD     Functional Gait  Assessment   Gait assessed  Yes    Gait Level Surface Walks 20 ft in less than 7 sec but greater than 5.5 sec, uses assistive device, slower speed, mild gait deviations, or deviates 6-10 in outside of the 12 in walkway width.   6.13s   Change in Gait Speed Able to change speed, demonstrates mild gait deviations, deviates 6-10 in outside of the 12 in walkway width, or no gait deviations, unable to achieve a major change in velocity, or uses a change in velocity, or uses an assistive device.    Gait with Horizontal Head Turns Performs head turns smoothly with slight change in gait velocity (eg, minor disruption to smooth gait path), deviates 6-10 in outside 12 in walkway width, or uses an assistive device.    Gait with Vertical Head Turns Performs head turns with no change in gait. Deviates no more than 6 in outside 12 in walkway width.     Gait and Pivot Turn Pivot turns safely within 3 sec and stops quickly with no loss of balance.    Step Over Obstacle Is able to step over one shoe box (4.5 in total height) without changing gait speed. No evidence of imbalance.    Gait with Narrow Base of Support Ambulates less than 4 steps heel to toe or cannot perform without assistance.    Gait with Eyes Closed Walks 20 ft, uses assistive device, slower speed, mild gait deviations, deviates 6-10 in outside 12 in walkway width. Ambulates 20 ft in less than 9 sec but greater than 7 sec.   10.15s, mild deviation to R   Ambulating Backwards Walks 20 ft, uses assistive device, slower speed, mild gait deviations, deviates 6-10 in outside 12 in walkway width.   15.72s, mild lateral deviation to R side   Steps Alternating feet, no rail.    Total Score 21    FGA comment: Medium fall risk              PATIENT SURVEYS:  ABC scale to be obtained  TREATMENT:   Self-care/home management  Pt inquiring if she is "allowed" to carry things at home, as her mother is afraid pt will injure her shoulder or knee and delay stroke rehab. Had pt carry crate around gym w/10# weight and no instability noted on flat ground. Had pt then carry crate up/down stairs and noted significant lateral instability w/ascending stairs, w/scissoring of RLE noted to correct LOB to L, requiring min A to stabilize. Pt able to descend steps slowly w/step-to pattern. Then trialed carrying single KB (10#) in each UE up/down steps. Pt had no instability carrying weight in RUE, but some truncal instability carrying weight in LUE. At this time, advised pt she is only to carry objects <10# in RUE if navigating stairs but can carry in BUEs if on flat ground. Pt and mother verbalized understanding.  Pt inquiring about walking on treadmill, deem pt safe to do this at this  time   PATIENT EDUCATION: Education details: POC, eval findings, see self-care above  Person educated: Patient and Parent Education method: Medical illustrator Education comprehension: verbalized understanding and returned demonstration  HOME EXERCISE PROGRAM: To be reviewed from CIR: [id number]   From CIR Therapist: Also instructed pt in 2 additional exercises but no printed versions available: - tall kneeling to half kneeling - sit to half kneeling and then return to sit. Perform to each side.   GOALS: Goals reviewed with patient? Yes  SHORT TERM GOALS: Target date: 02/28/2024   Pt will improve FGA to 28/30 for decreased fall risk   Baseline: 21/30 Goal status: INITIAL  2.  HiMat to be assessed at STG check and LTG updated  Baseline:  Goal status: INITIAL  3.  Pt will improve gait velocity to at least 3.4 ft/s for improved gait efficiency and return to PLOF  Baseline:  Goal status: INITIAL  4.  ABC scale to be assessed and LTG updated  Baseline:  Goal status: INITIAL  5.  Pt will demonstrate proper lifting technique of 25# weight from floor for proper body mechanics and safety at work Baseline:  Goal status: INITIAL  LONG TERM GOALS: Target date: 03/13/2024   Pt will improve gait velocity to at least 3.8 ft/s for improved gait efficiency and independence  Baseline:  Goal status: INITIAL  2.  HiMat goal  Baseline:  Goal status: INITIAL  3.  ABC scale goal  Baseline:  Goal status: INITIAL  4.  Pt will be able to carry 10# object in BUEs up/down 4 steps for improved coordination and safety in work environment   Baseline: can only carry w/single UE  Goal status: INITIAL  5.  Pt will perform [place] get up w/5# weight for improved shoulder stability OH, core stability and high level balance  Baseline:  Goal status: INITIAL   ASSESSMENT:  CLINICAL IMPRESSION: Patient is a 28 year old female referred to Neuro OPPT for CVA. 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 The following deficits were present during the exam: decreased functional strength, impaired balance and decreased activity tolerance. Based on FGA, pt is an incr risk for falls. Pt would benefit from skilled PT to address these impairments and functional limitations to maximize functional mobility independence.    OBJECTIVE IMPAIRMENTS: Abnormal gait, decreased activity tolerance, decreased balance, decreased coordination, decreased endurance, decreased mobility, decreased strength, increased muscle spasms, impaired UE functional use, improper body mechanics, and pain  ACTIVITY LIMITATIONS: carrying, lifting, squatting,  stairs, reach over head, hygiene/grooming, and locomotion level  PARTICIPATION LIMITATIONS: meal prep, cleaning, laundry, interpersonal relationship, driving, shopping, community activity, occupation, and yard work  PERSONAL FACTORS: 1 comorbidity: R internal capsule infarct/R basal ganglia stroke  are also affecting patient's functional outcome.   REHAB POTENTIAL: Excellent  CLINICAL DECISION MAKING: Evolving/moderate complexity  EVALUATION COMPLEXITY: Moderate  PLAN:  PT FREQUENCY:  2-3x/week (starting w/2x following eval)  PT DURATION: 6 weeks  PLANNED INTERVENTIONS: 95621- PT Re-evaluation, 97750- Physical Performance Testing, 97110-Therapeutic exercises, 97530- Therapeutic activity, [id number]- Neuromuscular re-education, 97535- Self Care, [id number]- Manual therapy, [phone]- Gait training, [phone]- Canalith repositioning, [phone]- Aquatic Therapy, Patient/Family education, Balance training, Stair training, Taping, Dry Needling, Joint mobilization, Spinal mobilization, Vestibular training, and DME instructions  PLAN FOR NEXT SESSION: ABC scale and update goal. Review HEP from CIR and update prn. High level agility, elliptical, L quad eccentric strength,  OH tasks    Olon Russ E Davari Lopes, PT, DPT 01/31/2024, 9:48 AM

## 2024-02-06 ENCOUNTER — Ambulatory Visit: Admitting: Physical Therapy

## 2024-02-06 DIAGNOSIS — R29898 Other symptoms and signs involving the musculoskeletal system: Secondary | ICD-10-CM | POA: Diagnosis not present

## 2024-02-06 DIAGNOSIS — R2689 Other abnormalities of gait and mobility: Secondary | ICD-10-CM | POA: Diagnosis not present

## 2024-02-06 DIAGNOSIS — R2681 Unsteadiness on feet: Secondary | ICD-10-CM | POA: Diagnosis not present

## 2024-02-06 DIAGNOSIS — M6281 Muscle weakness (generalized): Secondary | ICD-10-CM

## 2024-02-06 DIAGNOSIS — R278 Other lack of coordination: Secondary | ICD-10-CM | POA: Diagnosis not present

## 2024-02-06 DIAGNOSIS — R29818 Other symptoms and signs involving the nervous system: Secondary | ICD-10-CM | POA: Diagnosis not present

## 2024-02-06 DIAGNOSIS — I69354 Hemiplegia and hemiparesis following cerebral infarction affecting left non-dominant side: Secondary | ICD-10-CM | POA: Diagnosis not present

## 2024-02-06 NOTE — Therapy (Signed)
 OUTPATIENT PHYSICAL THERAPY NEURO TREATMENT   Patient Name: Faith Jordan MRN: [id number] DOB:02/21/1996, 28 y.o., female Today's Date: 02/06/2024   PCP: Gabriel John, NP REFERRING PROVIDER: Genetta Kenning, MD  END OF SESSION:  PT End of Session - 02/06/24 1441     Visit Number 2    Number of Visits 13    Date for PT Re-Evaluation 03/20/24    Authorization Type [employer]    PT Start Time 1440    PT Stop Time 1527    PT Time Calculation (min) 47 min    Activity Tolerance Patient tolerated treatment well    Behavior During Therapy [facility] for tasks assessed/performed              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   Acute flank pain 06/02/2022   Angio-edema 04/27/2016   Angioedema    Aquagenic angio-edema-urticaria    Asthma    no problems /not used recently   Dysautonomia (HCC)    Dysrhythmia    sinus tachycardia   Fibroid    right breast, adenoma   Headache(784.0)    History of COVID-19 11/21/2020   Hives 12/22/2015   Pneumonia    hx  6th grade   Sensation of fullness in both ears 02/02/2023   Snoring 02/11/2022   Urinary frequency 02/12/2019   Vaginal yeast infection 06/02/2022   Vasculitis (HCC)    Past Surgical History:  Procedure Laterality Date   ADENOIDECTOMY     BREAST LUMPECTOMY Right    BREAST LUMPECTOMY Right    MASS EXCISION Right 10/07/2014   Procedure: EXCISION OF RIGHT BREAST MASS;  Surgeon: Adalberto Hollow, MD;  Location: [facility] OR;  Service: General;  Laterality: Right;   TONSILLECTOMY AND ADENOIDECTOMY     TOOTH EXTRACTION     TRANSESOPHAGEAL ECHOCARDIOGRAM (CATH LAB) N/A 01/10/2024   Procedure: TRANSESOPHAGEAL ECHOCARDIOGRAM;  Surgeon: Hugh Madura, MD;  Location: [facility] INVASIVE CV LAB;  Service: Cardiovascular;  Laterality: N/A;   Patient Active Problem List   Diagnosis Date Noted   Coping style affecting medical condition 01/19/2024   Cerebrovascular accident (CVA) of  right basal ganglia (HCC) 01/12/2024   Stroke of right basal ganglia (HCC) 01/07/2024   Non-restorative sleep 05/26/2022   Sleep paralysis, 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   Vivid dream 05/26/2022   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   Excessive daytime sleepiness 05/26/2022   Sleep disturbance 03/17/2022   Panic attacks 02/11/2022   Vitamin B12 deficiency 07/30/2021   Vitamin D  deficiency 07/30/2021   Family history of hypothyroidism 07/30/2021   GAD (generalized anxiety disorder) 03/18/2020   Fatigue 11/06/2019   Chronic back pain 07/06/2019   Preventative health care 04/27/2018   Migraines 04/25/2018   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   Anemia 01/04/2018   Headache disorder 12/29/2015   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   Sinus tachycardia 12/22/2015   Dysautonomia (HCC) 12/22/2015   Dysautonomia, familial (HCC) 04/22/2015   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   Neurocardiogenic syncope 04/06/2013   Intermittent palpitations 04/06/2013    ONSET DATE: 01/26/2024 (referral)   REFERRING DIAG:  I63.81 (ICD-10-CM) - 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 Reports she feels increased stiffness in her LLE. Feels as though she is regressing. Tried wearing sandals over the weekend and it made her L foot hurt. States her mom said her foot was rolling in. No falls.    Pt accompanied by:  Self   PERTINENT HISTORY: familiar hemiplegic migraine, angioedema 2017,dysautonomia; infarct right internal capsule/ R basal ganglia  stroke  PAIN:  Are you having pain? No Pt reports occasional LUE pain   PRECAUTIONS: Fall  RED FLAGS: None   WEIGHT BEARING RESTRICTIONS: No  FALLS: Has patient fallen in last 6 months? No  LIVING ENVIRONMENT: Lives with: lives with their family Lives in: House/apartment Stairs: Yes: External: 4 steps; bilateral but cannot reach both Has following equipment at home: shower chair  PLOF: Independent  PATIENT GOALS: "Being normal again. Being able to go back to work and be independent"   OBJECTIVE:  Note: Objective measures were completed at Evaluation unless otherwise noted.  DIAGNOSTIC FINDINGS: MRI of brain on 01/07/24  IMPRESSION: 1. 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 2. Susceptibility related calcifications in the globus pallidus bilaterally. This likely reflects the sequela of chronic microvascular ischemia.  COGNITION: Overall cognitive status: Within functional limits for tasks assessed   SENSATION: Pt denies numbness/tingling in L hemibody   MUSCLE TONE: Non-fatiguing clonus of LLE  POSTURE: No Significant postural limitations  LOWER EXTREMITY ROM:     Active  Right Eval Left Eval  Hip flexion    Hip extension    Hip abduction    Hip adduction    Hip internal rotation    Hip external rotation    Knee flexion    Knee extension    Ankle dorsiflexion    Ankle plantarflexion    Ankle inversion    Ankle eversion     (Blank rows = not tested)  LOWER EXTREMITY MMT:  Tested in seated position   MMT Right Eval Left Eval  Hip flexion 5 4+  Hip extension    Hip abduction 5 5  Hip adduction 5 5  Hip internal rotation    Hip external rotation    Knee flexion 5 5  Knee extension 5 4+  Ankle dorsiflexion 5 5  Ankle plantarflexion    Ankle inversion    Ankle eversion    (Blank rows = not tested)  BED MOBILITY:  Not tested  TRANSFERS: Sit to stand: Complete Independence  Assistive device  utilized: None     Stand to sit: Complete Independence  Assistive device utilized: None      RAMP:  Not tested  CURB:  Not tested  GAIT: Gait pattern: step through pattern, decreased arm swing- Left, decreased step length- Right, decreased stance time- Left, decreased stride length, decreased hip/knee flexion- Left, lateral hip instability, and wide BOS Distance walked: Various clinic distances  Assistive device utilized: None Level of assistance: Modified independence Comments: Pt walks very guarded, as if she feels unstable. No instability noted other than single instance of L knee buckling which pt able to self-correct.    FUNCTIONAL TESTS:      PATIENT SURVEYS:  ABC scale to be obtained  TREATMENT:   NMR  Elliptical level 1 for 8 minutes (4 fwd and 4 retro) for improved BLE strength, facilitation of L knee flexion and endurance. Noted catch and release of L knee w/forward motion, which frustrated pt. Pt required standing rest break at 4 minute mark due to fatigue and provided therapeutic listening regarding pt's frustration w/her spasticity. In retro direction, noted decreased tone of LLE. RPE of 10/10 following activity  At ballet bar, single leg spanish squats (red theraband around RLE) x10 reps w/BUE support. Noted increased tremor in LLE w/activity, which again frustrated pt.   Self-care  Pt inquiring about treatment options/exercises she can do to help w/her spasticity. Due to spasticity affecting her ADLs, recommended pt speak to Dr. Tresia Fruit about Baclofen  tomorrow. Pt reports this was also mentioned by Dr. Raynaldo Call in CIR. Informed pt to continue walking and her exercises to improve strength and endurance as well.   PATIENT EDUCATION: Education details: See self-care  Person educated: Patient Education method: Explanation Education comprehension:  verbalized understanding  HOME EXERCISE PROGRAM: To be reviewed from CIR: [id number]   From CIR Therapist: Also instructed pt in 2 additional exercises but no printed versions available: - tall kneeling to half kneeling - sit to half kneeling and then return to sit. Perform to each side.   GOALS: Goals reviewed with patient? Yes  SHORT TERM GOALS: Target date: 02/28/2024   Pt will improve FGA to 28/30 for decreased fall risk   Baseline: 21/30 Goal status: INITIAL  2.  HiMat to be assessed at STG check and LTG updated  Baseline:  Goal status: INITIAL  3.  Pt will improve gait velocity to at least 3.4 ft/s for improved gait efficiency and return to PLOF  Baseline:  Goal status: INITIAL  4.  ABC scale to be assessed and LTG updated  Baseline:  Goal status: INITIAL  5.  Pt will demonstrate proper lifting technique of 25# weight from floor for proper body mechanics and safety at work Baseline:  Goal status: INITIAL  LONG TERM GOALS: Target date: 03/13/2024   Pt will improve gait velocity to at least 3.8 ft/s for improved gait efficiency and independence  Baseline:  Goal status: INITIAL  2.  HiMat goal  Baseline:  Goal status: INITIAL  3.  ABC scale goal  Baseline:  Goal status: INITIAL  4.  Pt will be able to carry 10# object in BUEs up/down 4 steps for improved coordination and safety in work environment   Baseline: can only carry w/single UE  Goal status: INITIAL  5.  Pt will perform [place] get up w/5# weight for improved shoulder stability OH, core stability and high level balance  Baseline:  Goal status: INITIAL   ASSESSMENT:  CLINICAL IMPRESSION: Emphasis of skilled PT session on functional endurance, eccentric quad strength and pt education. Pt limited by spasticity in LLE today, which pt reports has gotten worse over the past week. Pt w/non-fatiguing clonus of LLE and MAS of 1 in L quad.Pt very frustrated w/impact spasticity has on her ADLs, so  recommended pt speak to Dr. Tresia Fruit about this tomorrow. Continue POC.     OBJECTIVE IMPAIRMENTS: Abnormal gait, decreased activity tolerance, decreased balance, decreased coordination, decreased endurance, decreased mobility, decreased strength, increased muscle spasms, impaired UE functional use, improper body mechanics, and pain  ACTIVITY LIMITATIONS: carrying, lifting, squatting, stairs, reach over head, hygiene/grooming, and locomotion level  PARTICIPATION LIMITATIONS: meal prep, cleaning, laundry, interpersonal relationship, driving, shopping, community activity, occupation, and yard work  PERSONAL FACTORS: 1 comorbidity: R internal capsule infarct/R basal ganglia stroke  are also affecting patient's functional outcome.   REHAB POTENTIAL: Excellent  CLINICAL DECISION MAKING: Evolving/moderate complexity  EVALUATION COMPLEXITY: Moderate  PLAN:  PT FREQUENCY:  2-3x/week (starting w/2x following eval)  PT DURATION: 6 weeks  PLANNED INTERVENTIONS: 40981- PT Re-evaluation, 97750- Physical Performance Testing, 97110-Therapeutic exercises, 97530- Therapeutic activity, [id number]- Neuromuscular re-education, 97535- Self Care, [id number]- Manual therapy, [phone]- Gait training, [phone]- Canalith repositioning, [phone]- Aquatic Therapy, Patient/Family education, Balance training, Stair training, Taping, Dry Needling, Joint mobilization, Spinal mobilization, Vestibular training, and DME instructions  PLAN FOR NEXT SESSION: ABC scale and update goal. Review HEP from CIR and update prn. High level agility, elliptical, L quad eccentric strength, OH tasks    Dilon Lank E Artie Takayama, PT, DPT 02/06/2024, 3:28 PM

## 2024-02-07 ENCOUNTER — Other Ambulatory Visit: Payer: Self-pay

## 2024-02-07 ENCOUNTER — Ambulatory Visit: Admitting: Occupational Therapy

## 2024-02-07 ENCOUNTER — Ambulatory Visit: Admitting: Neurology

## 2024-02-07 DIAGNOSIS — I639 Cerebral infarction, unspecified: Secondary | ICD-10-CM | POA: Diagnosis not present

## 2024-02-07 DIAGNOSIS — R29898 Other symptoms and signs involving the musculoskeletal system: Secondary | ICD-10-CM

## 2024-02-07 DIAGNOSIS — R2689 Other abnormalities of gait and mobility: Secondary | ICD-10-CM | POA: Diagnosis not present

## 2024-02-07 DIAGNOSIS — G43709 Chronic migraine without aura, not intractable, without status migrainosus: Secondary | ICD-10-CM | POA: Diagnosis not present

## 2024-02-07 DIAGNOSIS — R002 Palpitations: Secondary | ICD-10-CM

## 2024-02-07 DIAGNOSIS — G43109 Migraine with aura, not intractable, without status migrainosus: Secondary | ICD-10-CM | POA: Diagnosis not present

## 2024-02-07 DIAGNOSIS — R29818 Other symptoms and signs involving the nervous system: Secondary | ICD-10-CM

## 2024-02-07 DIAGNOSIS — R278 Other lack of coordination: Secondary | ICD-10-CM | POA: Diagnosis not present

## 2024-02-07 DIAGNOSIS — M6281 Muscle weakness (generalized): Secondary | ICD-10-CM

## 2024-02-07 DIAGNOSIS — R2681 Unsteadiness on feet: Secondary | ICD-10-CM | POA: Diagnosis not present

## 2024-02-07 DIAGNOSIS — I69354 Hemiplegia and hemiparesis following cerebral infarction affecting left non-dominant side: Secondary | ICD-10-CM

## 2024-02-07 DIAGNOSIS — E876 Hypokalemia: Secondary | ICD-10-CM

## 2024-02-07 DIAGNOSIS — E785 Hyperlipidemia, unspecified: Secondary | ICD-10-CM | POA: Diagnosis not present

## 2024-02-07 MED ORDER — ZAVZPRET 10 MG/ACT NA SOLN: 1.0000 | Freq: Every day | NASAL | Status: DC | PRN

## 2024-02-07 MED ORDER — BACLOFEN 10 MG PO TABS
10.0000 mg | ORAL_TABLET | Freq: Three times a day (TID) | ORAL | 3 refills | Status: AC
  Filled 2024-02-07: qty 270, 90d supply, fill #0
  Filled 2024-06-18: qty 270, 90d supply, fill #1
  Filled 2024-10-07: qty 270, 90d supply, fill #2

## 2024-02-07 NOTE — Progress Notes (Signed)
 [facility]    Provider:  Dr Tresia Fruit Requesting Provider: Gabriel John, NP Primary Care Provider:  Gabriel John, NP  CC:  Cryptogenic stroke  HPI:  Faith Jordan is a 28 y.o. female here as requested by Gabriel John, NP for Stroke. has Dysautonomia, familial (HCC); ANS (autonomic nervous system) disease; Neurocardiogenic syncope; Intermittent palpitations; Anemia; Migraines; Familial hemiplegic migraine; Preventative health care; Chronic back pain; Fibroadenoma of right breast; Headache disorder; Sinus tachycardia; Dysautonomia (HCC); Fatigue; GAD (generalized anxiety disorder); Vitamin B12 deficiency; Vitamin D  deficiency; Family history of hypothyroidism; Panic attacks; Sleep disturbance; Non-restorative sleep; Sleep paralysis, recurrent isolated; Vivid dream; Retrognathia; Excessive daytime sleepiness; Stroke of right basal ganglia (HCC); History of CVA (cerebrovascular accident); Coping style affecting medical condition; and 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  This is an extremely lovely young lady with a history of hemiplegic migraine, angioedema, dysautonomia and asthma here with her mother who also has hemiplegic migraines.  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  She presented on January 07, 2024 for left-sided weakness in the face and left arm and leg without headache; none of her prior hemiplegic migraines in the last year are associated with limb weakness.  She went to bed asymptomatic the night prior and woke up at 11:30 PM and could not use her left side since she had a history of hemiplegic migraine she took her migraine medication and went back to sleep.  When she woke up in the morning she still could not use her left side and took more medications without improvement which prompted her to seek  emergency room care unfortunately she was approximately 20 hours out from the start of symptoms when seen in the emergency room.  A code stroke was activated, NIH stroke scale was 6 which included a 1 for facial palsy, 3 for motor left arm, 1 for motor left leg, 1 for dysarthria, CT of the head without acute abnormality and CTA were obtained which showed no large vessel occlusion, MRI demonstrated an infarct in the right basal ganglia.  She was loaded with Plavix  in the emergency room.  MRI of the brain showed a right 10 mm infarct in the posterior limb of the right internal capsule.  MRI of the brain also showed calcifications in the globus pallidus bilaterally unclear etiology otherwise basal ganglia were within normal limits.  Calcifications are of unknown etiology, and unknown if played any role in the stroke.  Had extensive stroke workup including TEE, TTE, lower extremity venous Doppler, TCD bubble study which were all negative, LDL was 106, A1c was 4.9, UDS negative, she was put on dual antiplatelet therapy and statin, currently there is still question regarding the etiology for her stroke.  Of particular note she had been using estrogen for OCT, denied smoking alcohol or illicit drug use.  Patient is here with her mother today.  She feels she is improving.  The situation has obviously been quite disturbing for her and her mother and she is teary today.  She continues to improve as far as strength.  She denies any sensory changes, she states she had left-sided numbness (points to the radial distribution in the left hand) which has resolved.  She denies any vision changes, further dysarthria or speech problems, swallow problems, falls, gait abnormality but does report she continues to have left-sided weakness and some  mild left-sided facial droop.  Patient has a history of hemiplegic migraines sometimes associated with left facial droop lasting 3 to 4 hours with resolution occurring on average once every 2  to 3 months but does endorse migraines twice a month without aura.  As stated above, her mother does have hemiplegic migraines as well and she also does well on Emgality  but she has never had a stroke.  Her father has never had a stroke however he does have atrial fibrillation which was diagnosed early in his 53s.  Her sister also has migraines but no hemiplegia associated with the migraines.  She was doing extremely well on Emgality , she was placed on Emgality  years prior due to her mother having had great success on Emgality  as well.  Since then she has been doing well, this is quite unexpected for her.  She was admitted to inpatient rehabilitation in has done extremely well and continues to improve.   Reviewed notes, labs and imaging from outside physicians, which showed:  CLINICAL DATA:  New onset left-sided weakness.   EXAM: MRI HEAD WITHOUT AND WITH CONTRAST   TECHNIQUE: Multiplanar, multiecho pulse sequences of the brain and surrounding structures were obtained without and with intravenous contrast.   CONTRAST:  8mL GADAVIST  GADOBUTROL  1 MMOL/ML IV SOLN   COMPARISON:  CT head without contrast and CT angio head and neck 01/07/2024.   FINDINGS: Brain: A 10 mm acute nonhemorrhagic infarct is present in the posterior limb of the right internal capsule. T2 and FLAIR hyperintensities are consistent with the time frame nearly 24 hours. No acute hemorrhage or mass lesion is present. Susceptibility related calcifications in the globus pallidus noted bilaterally.   Basal ganglia are otherwise within normal limits. The ventricles are of normal size. No significant extraaxial fluid collection is present.   The brainstem and cerebellum are within normal limits. Midline structures are within normal limits.   Vascular: Flow is present in the major intracranial arteries.   Skull and upper cervical spine: The craniocervical junction is normal. Upper cervical spine is within normal limits.  Marrow signal is unremarkable.   Sinuses/Orbits: The paranasal sinuses and mastoid air cells are clear. The globes and orbits are within normal limits.   IMPRESSION: 1. 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 2. Susceptibility related calcifications in the globus pallidus bilaterally. This likely reflects the sequela of chronic microvascular ischemia.  CLINICAL DATA:  Left hemiplegia.   EXAM: CT ANGIOGRAPHY HEAD AND NECK WITH AND WITHOUT CONTRAST   TECHNIQUE: Multidetector CT imaging of the head and neck was performed using the standard protocol during bolus administration of intravenous contrast. Multiplanar CT image reconstructions and MIPs were obtained to evaluate the vascular anatomy. Carotid stenosis measurements (when applicable) are obtained utilizing NASCET criteria, using the distal internal carotid diameter as the denominator.   RADIATION DOSE REDUCTION: This exam was performed according to the departmental dose-optimization program which includes automated exposure control, adjustment of the mA and/or kV according to patient size and/or use of iterative reconstruction technique.   CONTRAST:  75mL OMNIPAQUE  IOHEXOL  350 MG/ML SOLN   COMPARISON:  CT head without contrast 01/07/2024 at 6:41 p.m.   FINDINGS: CTA NECK FINDINGS   Aortic arch: A 3 vessel arch configuration is present. No significant atherosclerotic changes are present at the aortic arch. Great vessel origins are widely patent. No aneurysm or dissection is present.   Right carotid system: The right common carotid artery is within normal limits.  Bifurcation is within normal limits. The cervical right ICA is normal.   Left carotid system: The left common carotid artery is within normal limits. Bifurcation is unremarkable. Cervical left ICA is normal.   Vertebral arteries: The vertebral arteries are codominant. Both vertebral arteries originate from  the subclavian arteries without significant stenosis. No significant stenosis is present in either vertebral artery in the neck.   Skeleton: The vertebral body heights and alignment are normal. Mild reversal of the normal cervical lordosis may be positional. No focal osseous lesions are present.   Other neck: The soft tissues of the neck are otherwise unremarkable. Salivary glands are within normal limits. Thyroid  is normal. No significant adenopathy is present. No focal mucosal or submucosal lesions are present.   Upper chest: The lung apices are clear. The thoracic inlet is within normal limits.   Review of the MIP images confirms the above findings   CTA HEAD FINDINGS   Anterior circulation: Internal carotid arteries are within normal limits from the skull base to the ICA termini. The A1 and M1 segments are normal. The anterior communicating artery is patent. MCA bifurcations are normal bilaterally. The ACA and MCA branch vessels are normal bilaterally. No aneurysm is present.   Posterior circulation: PICA origins are visualized and normal. The vertebrobasilar junction and basilar artery are normal. The superior cerebellar arteries are patent bilaterally. Both posterior cerebral arteries originate from basilar tip. Small posterior communicating arteries are present. The PCA branch vessels are normal bilaterally. No aneurysm is present.   Venous sinuses: The dural sinuses are patent. The straight sinus and deep cerebral veins are intact. Cortical veins are within normal limits. No significant vascular malformation is evident.   Anatomic variants: None   Review of the MIP images confirms the above findings   IMPRESSION: 1. Normal CTA of the neck. 2. Normal CTA Circle of Willis without significant proximal stenosis, aneurysm, or branch vessel occlusion.  EXAM: CT HEAD WITHOUT CONTRAST   TECHNIQUE: Contiguous axial images were obtained from the base of the  skull through the vertex without intravenous contrast.   RADIATION DOSE REDUCTION: This exam was performed according to the departmental dose-optimization program which includes automated exposure control, adjustment of the mA and/or kV according to patient size and/or use of iterative reconstruction technique.   COMPARISON:  Report of CT head without contrast 04/28/2005.   FINDINGS: Brain: Dense calcifications are present within the globus pallidus bilaterally. No acute infarct, hemorrhage, or mass lesion is present. No significant white matter lesions are present. The deep brain nuclei are otherwise within normal limits. The ventricles are of normal size. No significant extraaxial fluid collection is present.   The brainstem and cerebellum are within normal limits. Midline structures are within normal limits.   Vascular: No hyperdense vessel or unexpected calcification.   Skull: Calvarium is intact. No focal lytic or blastic lesions are present. No significant extracranial soft tissue lesion is present.   Sinuses/Orbits: The paranasal sinuses and mastoid air cells are clear. The globes and orbits are within normal limits.   ASPECTS [facility] Stroke Program Early CT Score)   - Ganglionic level infarction (caudate, lentiform nuclei, internal capsule, insula, M1-M3 cortex): 7/7   - Supraganglionic infarction (M4-M6 cortex): 3/3   Total score (0-10 with 10 being normal): 10/10   IMPRESSION: 1. No acute intracranial abnormality or significant interval change. 2. Aspects is 10/10. 3. Dense calcifications within the globus pallidus bilaterally. This is a nonspecific finding but can be seen in the setting  of Fahr's disease.  February 08, 2024: Vitamin D  31.01, B12 280, CMP normal with BUN 9 and creatinine 0.94, CBC normal Mar 11, 2024 magnesium  normal 1.9 01/09/2024 ferritin 19, iron 75, TIBC 566, saturation 13, TIBC 491, January 08, 2024 ANA negative, cardiolipin antibodies IgG  less than 9/IgM less than 9/IgA less than 9 normal, prothrombin gene mutation not detected, factor V Leiden not detected, homocystine normal, beta-2  glycoprotein less than 9 normal, lupus anticoagulant within normal limits, protein S total 60 and activity 94 within normal limits, protein C 108 total and activity 125 within normal limits, Antithrombin III  105 within normal limits January 07, 2024: LDL inpatient 10 6 HDL 30 total cholesterol 151 triglycerides 73, HIV negative, hemoglobin A1c 4.9, CRP 0.8 normal, sed rate 1 normal, PTH within normal limits, urine rapid drug screen negative, hCG negative, ethanol negative November 29, 2023 TSH normal 1.08  Review of Systems: Patient complains of symptoms per HPI as well as the following symptoms none. Pertinent negatives and positives per HPI. All others negative.   Social History   Socioeconomic History   Marital status: Significant Other    Spouse name: Not on file   Number of children: Not on file   Years of education: Not on file   Highest education level: Bachelor's degree (e.g., BA, AB, BS)  Occupational History   Not on file  Tobacco Use   Smoking status: Never   Smokeless tobacco: Never  Vaping Use   Vaping status: Never Used  Substance and Sexual Activity   Alcohol use: No   Drug use: No   Sexual activity: Yes  Other Topics Concern   Not on file  Social History Narrative   Lives with fiance   R handed   Caffeine : 1 drink a day   Social Drivers of Corporate investment banker Strain: Not on file  Food Insecurity: No Food Insecurity (01/09/2024)   Hunger Vital Sign    Worried About Running Out of Food in the Last Year: Never true    Ran Out of Food in the Last Year: Never true  Transportation Needs: No Transportation Needs (01/09/2024)   PRAPARE - Administrator, Civil Service (Medical): No    Lack of Transportation (Non-Medical): No  Physical Activity: Not on file  Stress: Not on file  Social Connections: Not  on file  Intimate Partner Violence: Not At Risk (01/09/2024)   Humiliation, Afraid, Rape, and Kick questionnaire    Fear of Current or Ex-Partner: No    Emotionally Abused: No    Physically Abused: No    Sexually Abused: No    Family History  Problem Relation Age of Onset   Depression Mother    Migraines Mother    Narcolepsy Mother    Hypothyroidism Mother    Myasthenia gravis Maternal Grandfather    Breast cancer Maternal Grandmother 60   Asthma Other    Cancer Other    Epilepsy Other    Allergic rhinitis Neg Hx    Angioedema Neg Hx    Atopy Neg Hx    Eczema Neg Hx    Immunodeficiency Neg Hx    Urticaria Neg Hx     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   Acute flank pain 06/02/2022   Angio-edema 04/27/2016   Angioedema    Aquagenic angio-edema-urticaria    Asthma    no problems /not used recently   Dysautonomia (HCC)  Dysrhythmia    sinus tachycardia   Fibroid    right breast, adenoma   Headache(784.0)    History of COVID-19 11/21/2020   Hives 12/22/2015   Pneumonia    hx  6th grade   Sensation of fullness in both ears 02/02/2023   Snoring 02/11/2022   Urinary frequency 02/12/2019   Vaginal yeast infection 06/02/2022   Vasculitis [facility])     Patient Active Problem List   Diagnosis Date Noted   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) 02/10/2024   Coping style affecting medical condition 01/19/2024   History of CVA (cerebrovascular accident) 01/12/2024   Stroke of right basal ganglia (HCC) 01/07/2024   Non-restorative sleep 05/26/2022   Sleep paralysis, 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   Vivid dream 05/26/2022   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   Excessive daytime sleepiness 05/26/2022   Sleep disturbance 03/17/2022   Panic attacks 02/11/2022   Vitamin B12 deficiency 07/30/2021   Vitamin D  deficiency 07/30/2021   Family history of hypothyroidism 07/30/2021   GAD  (generalized anxiety disorder) 03/18/2020   Fatigue 11/06/2019   Chronic back pain 07/06/2019   Preventative health care 04/27/2018   Migraines 04/25/2018   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   Anemia 01/04/2018   Headache disorder 12/29/2015   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   Sinus tachycardia 12/22/2015   Dysautonomia (HCC) 12/22/2015   Dysautonomia, familial (HCC) 04/22/2015   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   Neurocardiogenic syncope 04/06/2013   Intermittent palpitations 04/06/2013    Past Surgical History:  Procedure Laterality Date   ADENOIDECTOMY     BREAST LUMPECTOMY Right    BREAST LUMPECTOMY Right    MASS EXCISION Right 10/07/2014   Procedure: EXCISION OF RIGHT BREAST MASS;  Surgeon: Adalberto Hollow, MD;  [facility];  Service: General;  Laterality: Right;   TONSILLECTOMY AND ADENOIDECTOMY     TOOTH EXTRACTION     TRANSESOPHAGEAL ECHOCARDIOGRAM (CATH LAB) N/A 01/10/2024   Procedure: TRANSESOPHAGEAL ECHOCARDIOGRAM;  Surgeon: Hugh Madura, MD;  Location: [facility] INVASIVE CV LAB;  Service: Cardiovascular;  Laterality: N/A;    Current Outpatient Medications  Medication Sig Dispense Refill   baclofen  (LIORESAL ) 10 MG tablet Take 1 tablet (10 mg total) by mouth 3 (three) times daily. 270 each 3   FLUoxetine  (PROZAC ) 40 MG capsule Take 1 capsule (40 mg total) by mouth daily. 30 capsule 0   Lactobacillus (ACIDOPHILUS) CAPS capsule Take 1 capsule by mouth daily. 30 capsule 0   LORazepam  (ATIVAN ) 1 MG tablet Take 1 tablet (1 mg total) by mouth every 6 (six) hours as needed for anxiety. 30 tablet 0   topiramate  (TOPAMAX ) 50 MG tablet Take 1 tablet (50 mg total) by mouth daily. May also take 1 tablet (50 mg total) daily as needed (daily during menstrual cycle). 180 tablet 3   traZODone  (DESYREL ) 100 MG tablet Take 1 tablet (100 mg total) by mouth at bedtime. 30 tablet 0   Zavegepant HCl (ZAVZPRET ) 10 MG/ACT SOLN Place 1 spray into the nose daily as  needed. 6 each    acetaminophen  (TYLENOL ) 325 MG tablet Take 2 tablets (650 mg total) by mouth every 6 (six) hours as needed for mild pain (pain score 1-3), fever or headache.     atorvastatin  (LIPITOR ) 80 MG tablet Take 1 tablet (80 mg total) by mouth daily. For cholesterol. 90 tablet 3   clopidogrel  (PLAVIX ) 75 MG tablet Take 1 tablet (75 mg total) by mouth daily. 90 tablet 3  Galcanezumab -gnlm (EMGALITY ) 120 MG/ML SOAJ Inject into the skin.     No current facility-administered medications for this visit.    Allergies as of 02/07/2024 - Review Complete 02/07/2024  Allergen Reaction Noted   Advil [ibuprofen] Dermatitis 03/31/2013   Nsaids Dermatitis 03/31/2013   Sulfa antibiotics Swelling 11/26/2012    Vitals: There were no vitals taken for this visit. Last Weight:  Wt Readings from Last 1 Encounters:  02/10/24 159 lb (72.1 kg)   Last Height:   Ht Readings from Last 1 Encounters:  02/10/24 5\' 7"  (1.702 m)     Physical exam: Exam: Gen: NAD, conversant, well nourised, obese, well groomed                     CV: RRR, no MRG. No Carotid Bruits. No peripheral edema, warm, nontender Eyes: Conjunctivae clear without exudates or hemorrhage  Neuro: Detailed Neurologic Exam  Speech:    Speech is normal; fluent and spontaneous with normal comprehension.  Cognition:    The patient is oriented to person, place, and time;     recent and remote memory intact;     language fluent;     normal attention, concentration,     fund of knowledge Cranial Nerves:    The pupils are equal, round, and reactive to light. The fundi are normal and spontaneous venous pulsations are present. Visual fields are full to finger confrontation. Extraocular movements are intact. Trigeminal sensation is intact and the muscles of mastication are normal. The face is symmetric. The palate elevates in the midline. Hearing intact. Voice is normal. Shoulder shrug is normal. The tongue has normal motion without  fasciculations.   Coordination: Difficulty with left finger-to-nose consistent with weakness Gait: nml  Motor Observation:    No asymmetry, no atrophy, and no involuntary movements noted. Tone:    Normal muscle tone.    Posture:    Posture is normal. normal erect    Strength:Left prox UE 4/5 Left distal UE 4+/5 Mild left hand grip weakness Left arm drift Left hip flexion 3+ Left Distal LE 4+  Strength is V/V in the right upper and right lower limbs.      Sensation: intact to LT     Reflex Exam:  DTR's:    Deep tendon reflexes in the upper and lower extremities are brisk on the left   Toes:    The toes are downgoing bilaterally.   Clonus: Closnus left AJ    Assessment/Plan:  This is an extremely lovely young lady with a history of hemiplegic migraine, angioedema, dysautonomia and asthma here with her mother who also has hemiplegic migraines.  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  She presented on January 07, 2024 for left-sided weakness in the face and left arm and leg without headache; none of her prior hemiplegic migraines in the last year are associated with limb weakness.  She went to bed asymptomatic the night prior and woke up at 11:30 PM and could not use her left side since she had a history of hemiplegic migraine she took her migraine medication and went back to sleep.  When she woke up in the morning she still could not use her left side and took more medications without improvement which prompted her to seek emergency room care unfortunately she was approximately 20 hours out from the start of symptoms when seen in the emergency room.  A code stroke  was activated, NIH stroke scale was 6 which included a 1 for facial palsy, 3 for motor left arm, 1 for motor left leg, 1 for dysarthria, CT of the head without acute abnormality and CTA were obtained which showed no large vessel occlusion, MRI  demonstrated an infarct in the right basal ganglia.  She was loaded with Plavix  in the emergency room.  MRI of the brain showed a right 10 mm infarct in the posterior limb of the right internal capsule.  MRI of the brain also showed calcifications in the globus pallidus bilaterally unclear etiology otherwise basal ganglia were within normal limits.  Calcifications are of unknown etiology, and unknown if played any role in the stroke.  Had extensive stroke workup including TEE, TTE, lower extremity venous Doppler, TCD bubble study which were all negative, LDL was 106, A1c was 4.9, UDS negative, she was put on dual antiplatelet therapy and statin, currently there is still question regarding the etiology for her stroke.  Of particular note she had been using estrogen for OCT, denied smoking alcohol or illicit drug use.  A workup for autoimmune disorders, vasculitic disorder/hypercoagulable etiologies were also negative.  -Unclear etiology, vascular neurology consulted thought patient might have so-called "migrainous stroke" with a focus at the right basal ganglia where her previous left facial droop was coming from, estrogen use further facilitated the event.  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 - Patient is to abstain from estrogen containing birth control, discussed there is increased risk for stroke in women with migraine with aura and a contraindication for the combined contraceptive pill for use by women who have migraine with aura. The risk for women with migraine without aura is lower. However other risk factors like smoking are far more likely to increase stroke risk than migraine. There is a recommendation for no smoking and for the use of OCPs without estrogen  such as progestogen only pills particularly for women with migraine with aura.Aaron Aas People who have migraine headaches with auras may be 3 times more likely to have a stroke caused by a blood clot, compared to migraine patients who don't see auras. Women who take hormone-replacement therapy may be 30 percent more likely to suffer a clot-based stroke than women not taking medication containing estrogen. Other risk factors like smoking and high blood pressure may be  much more important.  - Despite the stroke being in an area more consistent with lacunar infarct, given her age I think we would be remiss if we did not further follow-up with a 30-day heart monitor and possible loop recorder especially since she has a family history in her father of early onset A-fib in his 43s. - Continue Emgality .  Patient states she has 8 migraine days a month without aura and without medication overuse and greater than 15 headache days a month, we will try and get Botox  for migraine approval concurrently with Emgality .  At this time managing her migraines will be very important to prevent further events. - Triptans contraindicated - Will order 30-day cardiac monitor and pending results loop recorder. - Pending 30-day cardiac monitor results consider a second opinion for etiology of stroke and basal ganglia calcifications - Continue Plavix  (has an allergy to NSAIDs cannot use aspirin) - HDL low,  discussed recommendations - continue PT/OT - Baclofen  and Zavzpret  for acute migraine management, triptans contraindicated - B12 deficiency 4 years ago 152 last 280 recommend continued supplementation orally daily - LDL inpatient was 106 statin was started due to recommendation less than 70    Orders Placed This Encounter  Procedures   CBC with Differential/Platelets   Comprehensive metabolic panel with GFR   Lipid Panel   Cardiac event monitor   Meds ordered this encounter  Medications   baclofen  (LIORESAL ) 10 MG tablet     Sig: Take 1 tablet (10 mg total) by mouth 3 (three) times daily.    Dispense:  270 each    Refill:  3   Zavegepant HCl (ZAVZPRET ) 10 MG/ACT SOLN    Sig: Place 1 spray into the nose daily as needed.    Dispense:  6 each    Cc: Gabriel John, NP,  Gabriel John, NP  Aldona Amel, MD  [facility] [address] [place], [place] [address]  Phone [phone] Fax [phone]  I spent over 70 minutes of face-to-face and non-face-to-face time with patient on the  1. Acute cerebrovascular accident (CVA) (HCC)   2. Palpitations   3. Chronic migraine without aura without status migrainosus, not intractable   4. Migraine with aura and without status migrainosus, not intractable   5. Decreased potassium in the blood   6. Hyperlipidemia, unspecified hyperlipidemia type    diagnosis.  This included previsit chart review, lab review, study review, order entry, electronic health record documentation, patient education on the different diagnostic and therapeutic options, counseling and coordination of care, risks and benefits of management, compliance, or risk factor reduction

## 2024-02-07 NOTE — Patient Instructions (Addendum)
 30-day monitor and possibly loop recorder for afib [facility] Baclofen  for clonus and consider botox  if needed for spasticity/clonus Botox  for migraines  Zavzpret  nasal spray (bascially ubrelvy -like in abasal spray so quicker) Follow up 8 weeks  Zavegepant Nasal Spray What is this medication? ZAVEGEPANT (za VE je pant) treats migraines. It works by blocking a substance in the body that causes migraines. It is not used to prevent migraines. This medicine may be used for other purposes; ask your health care provider or pharmacist if you have questions. COMMON BRAND NAME(S): ZAVZPRET  What should I tell my care team before I take this medication? They need to know if you have any of these conditions: Kidney disease Liver disease An unusual or allergic reaction to zavegepant, other medications, foods, dyes, or preservatives Pregnant or trying to get pregnant Breast-feeding How should I use this medication? This medication is for use in the nose. Take it as directed on the prescription label. Do not use it more often than directed. Make sure that you are using your nasal spray correctly. Ask your care team if you have any questions. Talk to your care team about the use of this medication in children. Special care may be needed. Overdosage: If you think you have taken too much of this medicine contact a poison control center or emergency room at once. NOTE: This medicine is only for you. Do not share this medicine with others. What if I miss a dose? This does not apply. This medication is not for regular use. It should only be used as needed. What may interact with this medication? Decongestant nasal sprays This medication may affect how other medications work, and other medications may affect the way this medication works. Talk with your care team about all of the medications you take. They may suggest changes to your treatment plan to lower the risk of side effects  and to make sure your medications work as intended. This list may not describe all possible interactions. Give your health care provider a list of all the medicines, herbs, non-prescription drugs, or dietary supplements you use. Also tell them if you smoke, drink alcohol, or use illegal drugs. Some items may interact with your medicine. What should I watch for while using this medication? Visit your care team for regular checks on your progress. Tell your care team if your symptoms do not start to get better or if they get worse. What side effects may I notice from receiving this medication? Side effects that you should report to your care team as soon as possible: Allergic reactions--skin rash, itching, hives, swelling of the face, lips, tongue, or throat Side effects that usually do not require medical attention (report to your care team if they continue or are bothersome): Change in taste Dryness or irritation inside the nose Nausea Vomiting This list may not describe all possible side effects. Call your doctor for medical advice about side effects. You may report side effects to FDA at 1-800-FDA-1088. Where should I keep my medication? Keep out of the reach of children and pets. Store at room temperature between 20 and 25 degrees C (68 and 77 degrees F). Do not freeze. Get rid of any unused medication after the expiration date. To get rid of medications that are no longer needed or have expired: Take the medication to a medication take-back program. Check with your pharmacy or law enforcement to find a location. If you cannot return the medication, ask your pharmacist or  care team how to get rid of this medication safely. NOTE: This sheet is a summary. It may not cover all possible information. If you have questions about this medicine, talk to your doctor, pharmacist, or health care provider.  2024 Elsevier/Gold Standard (2021-12-31 00:00:00)

## 2024-02-07 NOTE — Therapy (Signed)
 OUTPATIENT OCCUPATIONAL THERAPY NEURO EVALUATION  Patient Name: Faith Jordan MRN: [id number] DOB:1996-08-26, 28 y.o., female Today's Date: 02/07/2024  PCP: Gabriel John, NP  REFERRING PROVIDER: Genetta Kenning, MD   END OF SESSION:  OT End of Session - 02/07/24 1653     Visit Number 1    Number of Visits 13   including eval   Date for OT Re-Evaluation 04/06/24    Authorization Type [employer] [facility]) 2025    OT Start Time 1453    OT Stop Time 1533    OT Time Calculation (min) 40 min    Activity Tolerance Patient tolerated treatment well    Behavior During Therapy [facility] for tasks assessed/performed             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   Acute flank pain 06/02/2022   Angio-edema 04/27/2016   Angioedema    Aquagenic angio-edema-urticaria    Asthma    no problems /not used recently   Dysautonomia (HCC)    Dysrhythmia    sinus tachycardia   Fibroid    right breast, adenoma   Headache(784.0)    History of COVID-19 11/21/2020   Hives 12/22/2015   Pneumonia    hx  6th grade   Sensation of fullness in both ears 02/02/2023   Snoring 02/11/2022   Urinary frequency 02/12/2019   Vaginal yeast infection 06/02/2022   Vasculitis (HCC)    Past Surgical History:  Procedure Laterality Date   ADENOIDECTOMY     BREAST LUMPECTOMY Right    BREAST LUMPECTOMY Right    MASS EXCISION Right 10/07/2014   Procedure: EXCISION OF RIGHT BREAST MASS;  Surgeon: Adalberto Hollow, MD;  Location: [facility] OR;  Service: General;  Laterality: Right;   TONSILLECTOMY AND ADENOIDECTOMY     TOOTH EXTRACTION     TRANSESOPHAGEAL ECHOCARDIOGRAM (CATH LAB) N/A 01/10/2024   Procedure: TRANSESOPHAGEAL ECHOCARDIOGRAM;  Surgeon: Hugh Madura, MD;  Location: [facility] INVASIVE CV LAB;  Service: Cardiovascular;  Laterality: N/A;   Patient Active Problem List   Diagnosis Date Noted   Coping style affecting medical condition 01/19/2024    Cerebrovascular accident (CVA) of right basal ganglia (HCC) 01/12/2024   Stroke of right basal ganglia (HCC) 01/07/2024   Non-restorative sleep 05/26/2022   Sleep paralysis, 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   Vivid dream 05/26/2022   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   Excessive daytime sleepiness 05/26/2022   Sleep disturbance 03/17/2022   Panic attacks 02/11/2022   Vitamin B12 deficiency 07/30/2021   Vitamin D  deficiency 07/30/2021   Family history of hypothyroidism 07/30/2021   GAD (generalized anxiety disorder) 03/18/2020   Fatigue 11/06/2019   Chronic back pain 07/06/2019   Preventative health care 04/27/2018   Migraines 04/25/2018   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   Anemia 01/04/2018   Headache disorder 12/29/2015   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   Sinus tachycardia 12/22/2015   Dysautonomia (HCC) 12/22/2015   Dysautonomia, familial (HCC) 04/22/2015   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   Neurocardiogenic syncope 04/06/2013   Intermittent palpitations 04/06/2013    ONSET DATE: 01/26/2024 (referral date)  REFERRING DIAG: I63.81 (ICD-10-CM) - 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 Pt reported difficulty washing hair d/t difficulty raising arm and moving arm while it's raised. 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 Pt reported concerns about FM skills as needed for  work, phone use, and typing on computer. Pt reported difficulty opening doors and pinching and opening containers with L hand. Pt reported difficulty holding cup in L hand.  Per 01/31/24 PT Eval: "Pt presents w/mother, not using AD. Having difficulty w/ADLs (washing hair, putting hair in pony tail, etc.). Pt reports she has poor endurance and difficulty lifting her LUE above her head. States she experienced some spasticity in her LLE when walking in the cold weather. Denies falls. Not sleeping super well - having bad dreams."  Pt accompanied by: self and family member (Tammy)  PERTINENT HISTORY: familiar hemiplegic migraine, angioedema 2017,dysautonomia; infarct right internal capsule/ R basal ganglia stroke   MRI of brain on 01/07/24   IMPRESSION: 1. 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 2. Susceptibility related calcifications in the globus pallidus bilaterally. This likely reflects the sequela of chronic microvascular ischemia.  PRECAUTIONS: Fall  WEIGHT BEARING RESTRICTIONS: No  PAIN:  Are you having pain? Yes: NPRS scale: 2/10 Pain location: upper back pain Pain description: aching Aggravating factors: sitting for prolonged periods of time, raising L arm Relieving factors: rest  FALLS: Has patient fallen in last 6 months? Yes when CVA event occurred though none since then   LIVING ENVIRONMENT: Lives with: lives with their family Lives in: House/apartment Stairs: Yes: External: 4 steps; bilateral but cannot reach both Has following equipment at home: shower chair  PLOF: Independent, typically living ind, worked as Engineer, civil (consulting)  PATIENT GOALS: "I want to get my hand and arm back and be able to go to work."  OBJECTIVE:  Note: Objective measures were completed at Evaluation unless otherwise noted.  HAND DOMINANCE: Right  ADLs: Overall ADLs:  Transfers/ambulation related to ADLs: Eating: ind Grooming: difficulty with completing  hair styling e.g. putting hair up in ponytail, difficulty with curling hair. Uses RUE to complete toothbrushing and brushing hair.  Difficulty with clipping fingernails. UB Dressing: ind, extra time. Buttons and zippers difficult.  LB Dressing: Ind, difficulty with pulling up pants and buttoning buttons on jeans, extra time for tying shoes Toileting: ind  Bathing: difficulty washing hair d/t unable to apply pressure while moving LUE, typically uses RUE to ensure thoroughness, shower bench is available PRN Tub Shower transfers: walk-in shower, pt reported SOB after showering or when walking a lot, sometimes requires rest breaks Equipment: Shower seat with back and Walk in shower  IADLs: Shopping: assistance from parents but pt participates, such as trying to reach for items on shelf and pushing cart Light housekeeping: pt completes dishwashing, has not attempted laundry Meal Prep: pt reported cooking but task is tiring Community mobility: has attempted to drive 1x with parent  Handwriting:  No change per pt.   Work activities: Pt expressed concerns with FM coordination and concerns about return to work considerations, such as coordination required for drawing up syringes from a vial for medication administration, completing IVs, typing, navigating phone.   MOBILITY STATUS: Independent  POSTURE COMMENTS:  Ind sitting balance, sometimes forward shoulder posture  ACTIVITY TOLERANCE: Activity  tolerance: Pt reported "I'm doing more than I was doing in the hospital, but I definitely get tired easier and take more breaks."  FUNCTIONAL OUTCOME MEASURES: PSF: 3.0    Total score = sum of the activity scores/number of activities Minimum detectable change (90%CI) for average score = 2 points Minimum detectable change (90%CI) for single activity score = 3 points   UPPER EXTREMITY ROM:    Active ROM Right eval Left eval  Shoulder flexion [facility]  Shoulder abduction  [facility]  Shoulder adduction     Shoulder extension    Shoulder internal rotation    Shoulder external rotation    Elbow flexion  WFL - slightly delayed movements  Elbow extension    Wrist flexion    Wrist extension    Wrist ulnar deviation    Wrist radial deviation    Wrist pronation    Wrist supination    (Blank rows = not tested)  OT palpated pt's L shoulder and noted "tightness" of muscles at L shoulder.   UPPER EXTREMITY MMT:     MMT Right eval Left eval  Shoulder flexion 5 3+  Shoulder abduction    Shoulder adduction    Shoulder extension    Shoulder internal rotation    Shoulder external rotation    Middle trapezius    Lower trapezius    Elbow flexion    Elbow extension    Wrist flexion    Wrist extension    Wrist ulnar deviation    Wrist radial deviation    Wrist pronation    Wrist supination    (Blank rows = not tested)  HAND FUNCTION: Grip strength: Right: 48.9, 53.5, 53.3 (51.9 lbs average) lbs; Left: 24.2, 22.2, 22.7 (23.1 lbs average) lbs  COORDINATION: 9 Hole Peg test: Right: 20 sec; Left: 40 sec  Pt reported practicing picking up pills, sorting change, and playing connect-4 at home.  SENSATION: Pt reported tingling in L thumb which has since resolves. Pt questioned if symptoms might have been from resting hand splint at night. Pt reported no other symptoms of numbness/tingling.  EDEMA: None noted  MUSCLE TONE: RUE: Within functional limits and LUE: slight ataxia  COGNITION: Overall cognitive status: Within functional limits for tasks assessed  Pt reported some difficulty with word-finding.  Pt reported sometimes asking same question again "a few hours apart." Pt's mother reported not noticing this recently.  VISION: Subjective report: Pt reported initial changes to vision (unable to read, blurry vision close-up and far away) though "okay now."  Baseline vision: No visual deficits Visual history:  none noted  VISION ASSESSMENT: Pt accurately clock on wall and reported  reading books on Kindle. Pt reported still using large print on phone since hospital.   Pt tracked easily to all 4 quadrants. Peripheral vision - WFL B sides, ?slightly decreased on L side though functional regardless  Patient has difficulty with following activities due to following visual impairments: None noted  PERCEPTION: Not tested  PRAXIS: Not tested  OBSERVATIONS: Pt ambulated ind. Pt soft-spoken and appeared tired during session. Pt was pleasant and appeared motivated to complete LUE functional tasks at home based on pt report.  TREATMENT DATE:     Self-Care OT educated pt and family on OT role, POC, UE anatomy, upright seated posture, scapular squeezes, prognosis, safety with I/ADLs. OT encouraged continuing to use affected UE for functional tasks. Pt verbalized understanding of all.  Pt reported she has ordered an e-stim unit for home use. OT educated pt on e-stim units and contraindications/precautions. Pt verbalized understanding. Pt reported that pt may receive heart monitor in near future. OT recommended to pt to f/u with MD to ask if pt can use of e-stim unit if pt has heart monitor. Pt verbalized understanding.  Pt's family asked about pt's SOB after tasks such as showering and walking far distances. OT educated pt on activity, activity tolerance, dx. OT advised pt to f/u with doctor regarding SOB if pt continues to have concerns. Pt and family acknowledged understanding.  OT educated pt on Driving considerations and safety and advised to f/u with doctor about driving considerations PRN. Pt and family acknowledged understanding.   PATIENT EDUCATION: Education details: see today's tx above Person educated: Patient and pt's parent Education method: Explanation Education comprehension: verbalized understanding  HOME EXERCISE  PROGRAM: TBD   GOALS: Goals reviewed with patient? Yes  SHORT TERM GOALS: Target date: 03/09/24  Pt will ind demo understanding of HEP using visual handouts. Baseline: new to outpt OT Goal status: INITIAL  2.  Pt will ind recall at least 3 energy conservation strategies. Baseline: Activity tolerance: Pt reported "I'm doing more than I was doing in the hospital, but I definitely get tired easier and take more breaks."  Goal status: INITIAL  3.  Pt and/or family will return demo of personal e-stim unit use and electrode placement for affected UE.  Baseline: pt reported she has ordered a personal e-stim unit.  Goal status: INITIAL  4.  Pt will report improved ability to use LUE during overhead reaching tasks with minimal use of RUE to compensate, such as hair care tasks and washing hair. Baseline: pt reported difficulty with hair styling (e.g. putting hair in ponytail, curling hair) Goal status: INITIAL  LONG TERM GOALS: Target date: 04/06/24  Patient will report at least two-point increase in average PSFS score or at least three-point increase in a single activity score indicating functionally significant improvement given minimum detectable change using adaptive strategies and A/E PRN. Baseline: PSF: 3.0 total score (See above for individual activity scores)  Goal status: INITIAL  2.  Patient will demonstrate at least 30 lbs LUE grip strength as needed to open jars and other containers.  Baseline: Grip strength: Right: 48.9, 53.5, 53.3 (51.9 lbs average) lbs; Left: 24.2, 22.2, 22.7 (23.1 lbs average) lbs Goal status: INITIAL  3.  Patient will demo improved FM coordination as evidenced by completing nine-hole peg with use of LUE in 30 seconds or less.  Baseline: 9 Hole Peg test: Right: 20 sec; Left: 40 sec Goal status: INITIAL  4.  Pt will verbalize understanding of A/E, adaptive strategies for functional tasks as needed for return to work considerations. Baseline: Work  activities: Pt expressed concerns with FM coordination and concerns about return to work considerations, such as coordination required for drawing up syringes from a vial for medication administration, completing IVs, typing, navigating phone.  Goal status: INITIAL  ASSESSMENT:  CLINICAL IMPRESSION: Patient is a 28 y.o. 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 Hx includes familiar hemiplegic migraine, angioedema 2017,dysautonomia; infarct right internal  capsule/ R basal ganglia stroke. Patient currently presents below baseline level of functioning demonstrating functional deficits and impairments as noted below. Pt would benefit from skilled OT services in the outpatient setting to work on impairments as noted below to help pt return to PLOF as able.     PERFORMANCE DEFICITS: in functional skills including ADLs, IADLs, coordination, dexterity, proprioception, sensation, tone, ROM, strength, pain, muscle spasms, flexibility, Fine motor control, Gross motor control, mobility, balance, endurance, and UE functional use, cognitive skills including attention and energy/drive, and psychosocial skills including environmental adaptation.   IMPAIRMENTS: are limiting patient from ADLs, IADLs, rest and sleep, work, leisure, and social participation.   CO-MORBIDITIES: may have co-morbidities  that affects occupational performance. Patient will benefit from skilled OT to address above impairments and improve overall function.  MODIFICATION OR ASSISTANCE TO COMPLETE EVALUATION: Min-Moderate modification of tasks or assist with assess necessary to complete an evaluation.  OT OCCUPATIONAL PROFILE AND HISTORY: Detailed assessment: Review of records and additional review of physical, cognitive, psychosocial history related to current functional performance.  CLINICAL DECISION MAKING: Moderate - several treatment options,  min-mod task modification necessary  REHAB POTENTIAL: Good  EVALUATION COMPLEXITY: Moderate    PLAN:  OT FREQUENCY: 2x/week  OT DURATION: 6 weeks (dates extended to allow for scheduling)  PLANNED INTERVENTIONS: 40981 OT Re-evaluation, 97535 self care/ADL training, 19147 therapeutic exercise, 97530 therapeutic activity, 97112 neuromuscular re-education, 97140 manual therapy, 97035 ultrasound, 97018 paraffin, 82956 fluidotherapy, 97010 moist heat, 97010 cryotherapy, 97760 Orthotic Initial, 97761 Prosthetic Initial, 97763 Orthotic/Prosthetic subsequent, passive range of motion, functional mobility training, visual/perceptual remediation/compensation, energy conservation, patient/family education, and DME and/or AE instructions  RECOMMENDED OTHER SERVICES: PT eval completed, SLP eval scheduled  CONSULTED AND AGREED WITH PLAN OF CARE: Patient and family member  PLAN FOR NEXT SESSION:  Functional use of LUE: e.g. clipping nails, putting hair in ponytail, grooming, navigating phone, typing, see PSFS items HEP - overhead reaching, stretches for LUE, scapular squeezes, FM coordination, theraputty Energy conservation handout E-stim unit handout for electrode placement - does pt have heart monitor now? Did pt's personal e-stim unit arrive?  Oakley Bellman, OT 02/07/2024, 5:31 PM

## 2024-02-08 ENCOUNTER — Other Ambulatory Visit: Payer: Self-pay

## 2024-02-08 ENCOUNTER — Encounter: Payer: Self-pay | Admitting: Neurology

## 2024-02-08 ENCOUNTER — Encounter: Payer: Self-pay | Admitting: Primary Care

## 2024-02-08 ENCOUNTER — Ambulatory Visit (INDEPENDENT_AMBULATORY_CARE_PROVIDER_SITE_OTHER): Admitting: Primary Care

## 2024-02-08 VITALS — BP 118/78 | HR 75 | Temp 97.7°F | Ht 67.0 in | Wt 159.0 lb

## 2024-02-08 DIAGNOSIS — F411 Generalized anxiety disorder: Secondary | ICD-10-CM

## 2024-02-08 DIAGNOSIS — R5383 Other fatigue: Secondary | ICD-10-CM | POA: Diagnosis not present

## 2024-02-08 DIAGNOSIS — E559 Vitamin D deficiency, unspecified: Secondary | ICD-10-CM

## 2024-02-08 DIAGNOSIS — E538 Deficiency of other specified B group vitamins: Secondary | ICD-10-CM

## 2024-02-08 DIAGNOSIS — Z8673 Personal history of transient ischemic attack (TIA), and cerebral infarction without residual deficits: Secondary | ICD-10-CM

## 2024-02-08 LAB — CBC WITH DIFFERENTIAL/PLATELET
Basophils Absolute: 0 10*3/uL (ref 0.0–0.2)
Basos: 0 %
EOS (ABSOLUTE): 0 10*3/uL (ref 0.0–0.4)
Eos: 1 %
Hematocrit: 45.6 % (ref 34.0–46.6)
Hemoglobin: 15 g/dL (ref 11.1–15.9)
Immature Grans (Abs): 0 10*3/uL (ref 0.0–0.1)
Immature Granulocytes: 0 %
Lymphocytes Absolute: 2 10*3/uL (ref 0.7–3.1)
Lymphs: 26 %
MCH: 28.9 pg (ref 26.6–33.0)
MCHC: 32.9 g/dL (ref 31.5–35.7)
MCV: 88 fL (ref 79–97)
Monocytes Absolute: 0.7 10*3/uL (ref 0.1–0.9)
Monocytes: 9 %
Neutrophils Absolute: 4.9 10*3/uL (ref 1.4–7.0)
Neutrophils: 64 %
Platelets: 269 10*3/uL (ref 150–450)
RBC: 5.19 x10E6/uL (ref 3.77–5.28)
RDW: 13.1 % (ref 11.7–15.4)
WBC: 7.7 10*3/uL (ref 3.4–10.8)

## 2024-02-08 LAB — COMPREHENSIVE METABOLIC PANEL WITH GFR
ALT: 28 IU/L (ref 0–32)
AST: 19 IU/L (ref 0–40)
Albumin: 4.7 g/dL (ref 4.0–5.0)
Alkaline Phosphatase: 106 IU/L (ref 44–121)
BUN/Creatinine Ratio: 10 (ref 9–23)
BUN: 9 mg/dL (ref 6–20)
Bilirubin Total: 0.8 mg/dL (ref 0.0–1.2)
CO2: 20 mmol/L (ref 20–29)
Calcium: 10.1 mg/dL (ref 8.7–10.2)
Chloride: 106 mmol/L (ref 96–106)
Creatinine, Ser: 0.94 mg/dL (ref 0.57–1.00)
Globulin, Total: 2.5 g/dL (ref 1.5–4.5)
Glucose: 81 mg/dL (ref 70–99)
Potassium: 4.2 mmol/L (ref 3.5–5.2)
Sodium: 140 mmol/L (ref 134–144)
Total Protein: 7.2 g/dL (ref 6.0–8.5)
eGFR: 85 mL/min/{1.73_m2} (ref >59)

## 2024-02-08 LAB — VITAMIN B12: Vitamin B-12: 280 pg/mL (ref 211–911)

## 2024-02-08 LAB — LIPID PANEL
Chol/HDL Ratio: 2.7 ratio (ref 0.0–4.4)
Cholesterol, Total: 98 mg/dL — ABNORMAL LOW (ref 100–199)
HDL: 36 mg/dL — ABNORMAL LOW (ref >39)
LDL Chol Calc (NIH): 45 mg/dL (ref 0–99)
Triglycerides: 87 mg/dL (ref 0–149)
VLDL Cholesterol Cal: 17 mg/dL (ref 5–40)

## 2024-02-08 LAB — VITAMIN D 25 HYDROXY (VIT D DEFICIENCY, FRACTURES): VITD: 31.01 ng/mL (ref 30.00–100.00)

## 2024-02-08 MED ORDER — CLOPIDOGREL BISULFATE 75 MG PO TABS
75.0000 mg | ORAL_TABLET | Freq: Every day | ORAL | 3 refills | Status: DC
  Filled 2024-02-08 – 2024-02-21 (×2): qty 90, 90d supply, fill #0

## 2024-02-08 MED ORDER — ATORVASTATIN CALCIUM 80 MG PO TABS
80.0000 mg | ORAL_TABLET | Freq: Every day | ORAL | 3 refills | Status: DC
Start: 2024-02-08 — End: 2024-02-21
  Filled 2024-02-08: qty 90, 90d supply, fill #0

## 2024-02-08 NOTE — Assessment & Plan Note (Signed)
Repeat levels pending. 

## 2024-02-08 NOTE — Assessment & Plan Note (Signed)
 Likely secondary to recent stroke. Checking lab work today including vitamin B12 and D.  Continue with strength training with PT/OT.

## 2024-02-08 NOTE — Assessment & Plan Note (Signed)
 With recent hospitalization. Hospital notes, labs, imaging reviewed.  Continue clopidogrel  75 mg daily, atorvastatin  80 mg daily. Reviewed labs from neurology from yesterday.  Follow-up with vascular services as planned. Continue with outpatient PT/OT.

## 2024-02-08 NOTE — Progress Notes (Signed)
 Subjective:    Patient ID: Faith Jordan, female    DOB: May 08, 1996, 28 y.o.   MRN: [id number]  HPI  Faith Jordan is a very pleasant 28 y.o. female with a history of familial hemiplegic migraine, ANS, anemia, sinus tachycardia, CVA, right basal ganglia who presents today for hospital follow-up.  Her mother joins us  today.  She presented to [facility] ED on 01/05/2024 with acute onset of left-sided weakness and facial droop without headache.  CT head and CTA head and neck were unremarkable, however MRI revealed 10 mm acute nonhemorrhagic infarct of the right posterior limb of the right internal capsule, bilateral basal ganglia calcification.  She was admitted for further evaluation.  Anticoagulation workup was normal.  Echocardiogram with LVEF of 60 to 65% without abnormality to wall motion.  TEE without atrial appendage thrombus.  Initiated on Plavix  75 mg for CVA prophylaxis and Lovenox  for DVT prophylaxis.  Emgality  and Nurtec were discontinued per patient's neurologist.   She was admitted to rehab services on 01/12/2024 for physical therapy, speech therapy, Occupational Therapy.  Psychiatry was involved with patient's care during visit.  Her fluoxetine , trazodone , Ativan  were continued. She was discharged home on 01/27/2024.   Since her discharge home she was evaluated by her neurologist yesterday.  The plan was for a 30-day monitor and possibly loop recorder for evaluation of atrial fibrillation.  She was referred to a vascular specialist.  She will undergo Botox and was prescribed Zavzpret  for migraine treatment. She was prescribed Baclofen  for clonus. She plans to start Emgality  again.   Today she is feeling better. She continues to experience residual left sided lower extremity weakness with clonus and spasticity. Her speech has improved. She is working with outpatient PT/OT.  She is compliant to her clopidogrel  and atorvastatin .  She underwent lab work yesterday per neurology with LDL of 45, HDL  of 36.  She is needing refills of her medications.  She is staying with her mother.  Her plan is to remain out of work for 6 weeks of outpatient physical/Occupational Therapy then reassess.  Neurology will determine when to return to work.  She does feel fatigued since her stroke.  She will experience shortness of breath when walking to the grocery store for prolonged periods of time, feel fatigued after normal ADLs.  She is working with PT/OT for strengthening and endurance.   Review of Systems  Cardiovascular:  Negative for chest pain and palpitations.  Neurological:  Positive for speech difficulty and weakness.  Psychiatric/Behavioral:  The patient is nervous/anxious.          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   Acute flank pain 06/02/2022   Angio-edema 04/27/2016   Angioedema    Aquagenic angio-edema-urticaria    Asthma    no problems /not used recently   Dysautonomia [facility])    Dysrhythmia    sinus tachycardia   Fibroid    right breast, adenoma   Headache(784.0)    History of COVID-19 11/21/2020   Hives 12/22/2015   Pneumonia    hx  6th grade   Sensation of fullness in both ears 02/02/2023   Snoring 02/11/2022   Urinary frequency 02/12/2019   Vaginal yeast infection 06/02/2022   Vasculitis (HCC)     Social History   Socioeconomic History   Marital status: Significant Other    Spouse name: Not on file   Number of children: Not on file  Years of education: Not on file   Highest education level: Bachelor's degree (e.g., BA, AB, BS)  Occupational History   Not on file  Tobacco Use   Smoking status: Never   Smokeless tobacco: Never  Vaping Use   Vaping status: Never Used  Substance and Sexual Activity   Alcohol use: No   Drug use: No   Sexual activity: Yes  Other Topics Concern   Not on file  Social History Narrative   Lives with fiance   R handed   Caffeine : 1 drink a day   Social Drivers of Manufacturing engineer Strain: Not on file  Food Insecurity: No Food Insecurity (01/09/2024)   Hunger Vital Sign    Worried About Running Out of Food in the Last Year: Never true    Ran Out of Food in the Last Year: Never true  Transportation Needs: No Transportation Needs (01/09/2024)   PRAPARE - Administrator, Civil Service (Medical): No    Lack of Transportation (Non-Medical): No  Physical Activity: Not on file  Stress: Not on file  Social Connections: Not on file  Intimate Partner Violence: Not At Risk (01/09/2024)   Humiliation, Afraid, Rape, and Kick questionnaire    Fear of Current or Ex-Partner: No    Emotionally Abused: No    Physically Abused: No    Sexually Abused: No    Past Surgical History:  Procedure Laterality Date   ADENOIDECTOMY     BREAST LUMPECTOMY Right    BREAST LUMPECTOMY Right    MASS EXCISION Right 10/07/2014   Procedure: EXCISION OF RIGHT BREAST MASS;  Surgeon: Adalberto Hollow, MD;  Location: [facility] OR;  Service: General;  Laterality: Right;   TONSILLECTOMY AND ADENOIDECTOMY     TOOTH EXTRACTION     TRANSESOPHAGEAL ECHOCARDIOGRAM (CATH LAB) N/A 01/10/2024   Procedure: TRANSESOPHAGEAL ECHOCARDIOGRAM;  Surgeon: Hugh Madura, MD;  Location: [facility] INVASIVE CV LAB;  Service: Cardiovascular;  Laterality: N/A;    Family History  Problem Relation Age of Onset   Depression Mother    Migraines Mother    Narcolepsy Mother    Hypothyroidism Mother    Myasthenia gravis Maternal Grandfather    Breast cancer Maternal Grandmother 75   Asthma Other    Cancer Other    Epilepsy Other    Allergic rhinitis Neg Hx    Angioedema Neg Hx    Atopy Neg Hx    Eczema Neg Hx    Immunodeficiency Neg Hx    Urticaria Neg Hx     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, blistering peeling skin   Nsaids Dermatitis    Reaction similar to Stephens-Johnson, blistering peeling rash   Sulfa Antibiotics Swelling     Facial swelling    Current Outpatient Medications on File Prior to Visit  Medication Sig Dispense Refill   acetaminophen  (TYLENOL ) 325 MG tablet Take 2 tablets (650 mg total) by mouth every 6 (six) hours as needed for mild pain (pain score 1-3), fever or headache.     baclofen  (LIORESAL ) 10 MG tablet Take 1 tablet (10 mg total) by mouth 3 (three) times daily. 270 each 3   FLUoxetine  (PROZAC ) 40 MG capsule Take 1 capsule (40 mg total) by mouth daily. 30 capsule 0   Lactobacillus (ACIDOPHILUS) CAPS capsule Take 1 capsule by mouth daily. 30 capsule 0   LORazepam  (ATIVAN ) 1 MG tablet Take 1 tablet (1 mg total)  by mouth every 6 (six) hours as needed for anxiety. 30 tablet 0   potassium chloride  (KLOR-CON  M) 10 MEQ tablet Take 1 tablet (10 mEq total) by mouth daily. 30 tablet 0   topiramate  (TOPAMAX ) 50 MG tablet Take 1 tablet (50 mg total) by mouth daily. May also take 1 tablet (50 mg total) daily as needed (daily during menstrual cycle). 180 tablet 3   traZODone  (DESYREL ) 100 MG tablet Take 1 tablet (100 mg total) by mouth at bedtime. 30 tablet 0   Zavegepant HCl (ZAVZPRET ) 10 MG/ACT SOLN Place 1 spray into the nose daily as needed. 6 each    lidocaine  (LIDODERM ) 5 % Place 1 patch onto the skin daily. Remove & Discard patch within 12 hours or as directed by MD (Patient not taking: Reported on 02/08/2024) 30 patch 0   No current facility-administered medications on file prior to visit.    BP 118/78   Pulse 75   Temp 97.7 F (36.5 C) (Temporal)   Ht 5\' 7"  (1.702 m)   Wt 159 lb (72.1 kg)   LMP 01/08/2024 (Exact Date)   SpO2 99%   BMI 24.90 kg/m  Objective:   Physical Exam Cardiovascular:     Rate and Rhythm: Normal rate and regular rhythm.  Pulmonary:     Effort: Pulmonary effort is normal.     Breath sounds: Normal breath sounds.  Musculoskeletal:     Cervical back: Neck supple.  Skin:    General: Skin is warm and dry.  Neurological:     Mental Status: She is alert and oriented  to person, place, and time.     Comments: Mild left upper and lower extremity weakness on exam, 4/5 strength. Mild left sided dropping with smiling and frowning.   Psychiatric:        Mood and Affect: Mood normal.           Assessment & Plan:  Fatigue, unspecified type Assessment & Plan: Likely secondary to recent stroke. Checking lab work today including vitamin B12 and D.  Continue with strength training with PT/OT.  Orders: -     Vitamin B12 -     VITAMIN D  25 Hydroxy (Vit-D Deficiency, Fractures)  History of CVA (cerebrovascular accident) Assessment & Plan: With recent hospitalization. Hospital notes, labs, imaging reviewed.  Continue clopidogrel  75 mg daily, atorvastatin  80 mg daily. Reviewed labs from neurology from yesterday.  Follow-up with vascular services as planned. Continue with outpatient PT/OT.  Orders: -     Atorvastatin  Calcium ; Take 1 tablet (80 mg total) by mouth daily. For cholesterol.  Dispense: 90 tablet; Refill: 3 -     Clopidogrel  Bisulfate; Take 1 tablet (75 mg total) by mouth daily.  Dispense: 90 tablet; Refill: 3  Vitamin B12 deficiency Assessment & Plan: Repeat levels pending.  Orders: -     Vitamin B12  Vitamin D  deficiency Assessment & Plan: Repeat levels pending.  Orders: -     VITAMIN D  25 Hydroxy (Vit-D Deficiency, Fractures)  GAD (generalized anxiety disorder) Assessment & Plan: Deteriorated given recent stroke.  Continue with fluoxetine  40 mg daily, lorazepam  as needed, trazodone  50 mg at bedtime. Recommended she schedule with therapy.         Kaiah Hosea K Korie Streat, NP

## 2024-02-08 NOTE — Patient Instructions (Signed)
 Stop by the lab prior to leaving today. I will notify you of your results once received.   It was a pleasure to see you today!

## 2024-02-08 NOTE — Assessment & Plan Note (Signed)
 Deteriorated given recent stroke.  Continue with fluoxetine  40 mg daily, lorazepam  as needed, trazodone  50 mg at bedtime. Recommended she schedule with therapy.

## 2024-02-09 ENCOUNTER — Ambulatory Visit: Admitting: Physical Therapy

## 2024-02-09 ENCOUNTER — Ambulatory Visit: Admitting: Occupational Therapy

## 2024-02-09 DIAGNOSIS — R2689 Other abnormalities of gait and mobility: Secondary | ICD-10-CM

## 2024-02-09 DIAGNOSIS — R278 Other lack of coordination: Secondary | ICD-10-CM

## 2024-02-09 DIAGNOSIS — I69354 Hemiplegia and hemiparesis following cerebral infarction affecting left non-dominant side: Secondary | ICD-10-CM

## 2024-02-09 DIAGNOSIS — M6281 Muscle weakness (generalized): Secondary | ICD-10-CM | POA: Diagnosis not present

## 2024-02-09 DIAGNOSIS — R29898 Other symptoms and signs involving the musculoskeletal system: Secondary | ICD-10-CM | POA: Diagnosis not present

## 2024-02-09 DIAGNOSIS — R29818 Other symptoms and signs involving the nervous system: Secondary | ICD-10-CM | POA: Diagnosis not present

## 2024-02-09 DIAGNOSIS — R2681 Unsteadiness on feet: Secondary | ICD-10-CM | POA: Diagnosis not present

## 2024-02-09 NOTE — Therapy (Signed)
 OUTPATIENT PHYSICAL THERAPY NEURO TREATMENT   Patient Name: Faith Jordan MRN: [id number] DOB:01/04/1996, 28 y.o., female Today's Date: 02/09/2024   PCP: Gabriel John, NP REFERRING PROVIDER: Genetta Kenning, MD  END OF SESSION:  PT End of Session - 02/09/24 1533     Visit Number 3    Number of Visits 13    Date for PT Re-Evaluation 03/20/24    Authorization Type [employer]    PT Start Time 4098    PT Stop Time 1616    PT Time Calculation (min) 44 min    Activity Tolerance Patient tolerated treatment well    Behavior During Therapy [facility] for tasks assessed/performed               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   Acute flank pain 06/02/2022   Angio-edema 04/27/2016   Angioedema    Aquagenic angio-edema-urticaria    Asthma    no problems /not used recently   Dysautonomia (HCC)    Dysrhythmia    sinus tachycardia   Fibroid    right breast, adenoma   Headache(784.0)    History of COVID-19 11/21/2020   Hives 12/22/2015   Pneumonia    hx  6th grade   Sensation of fullness in both ears 02/02/2023   Snoring 02/11/2022   Urinary frequency 02/12/2019   Vaginal yeast infection 06/02/2022   Vasculitis (HCC)    Past Surgical History:  Procedure Laterality Date   ADENOIDECTOMY     BREAST LUMPECTOMY Right    BREAST LUMPECTOMY Right    MASS EXCISION Right 10/07/2014   Procedure: EXCISION OF RIGHT BREAST MASS;  Surgeon: Adalberto Hollow, MD;  [facility] OR;  Service: General;  Laterality: Right;   TONSILLECTOMY AND ADENOIDECTOMY     TOOTH EXTRACTION     TRANSESOPHAGEAL ECHOCARDIOGRAM (CATH LAB) N/A 01/10/2024   Procedure: TRANSESOPHAGEAL ECHOCARDIOGRAM;  Surgeon: Hugh Madura, MD;  Location: [facility] INVASIVE CV LAB;  Service: Cardiovascular;  Laterality: N/A;   Patient Active Problem List   Diagnosis Date Noted   Coping style affecting medical condition 01/19/2024   History of CVA (cerebrovascular  accident) 01/12/2024   Stroke of right basal ganglia (HCC) 01/07/2024   Non-restorative sleep 05/26/2022   Sleep paralysis, 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   Vivid dream 05/26/2022   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   Excessive daytime sleepiness 05/26/2022   Sleep disturbance 03/17/2022   Panic attacks 02/11/2022   Vitamin B12 deficiency 07/30/2021   Vitamin D  deficiency 07/30/2021   Family history of hypothyroidism 07/30/2021   GAD (generalized anxiety disorder) 03/18/2020   Fatigue 11/06/2019   Chronic back pain 07/06/2019   Preventative health care 04/27/2018   Migraines 04/25/2018   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   Anemia 01/04/2018   Headache disorder 12/29/2015   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   Sinus tachycardia 12/22/2015   Dysautonomia (HCC) 12/22/2015   Dysautonomia, familial (HCC) 04/22/2015   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   Neurocardiogenic syncope 04/06/2013   Intermittent palpitations 04/06/2013    ONSET DATE: 01/26/2024 (referral)   REFERRING DIAG:  I63.81 (ICD-10-CM) - 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 Started baclofen , taking 2x/day. Fatigued. Foot hurts     Pt accompanied by:  Self and friend, Ivin Marrow  PERTINENT HISTORY: familiar hemiplegic migraine, angioedema 2017,dysautonomia; infarct right internal capsule/ R basal ganglia stroke  PAIN:  Are you having pain? Yes: NPRS scale: 1/10 Pain location: L foot Pain description: Achy     PRECAUTIONS: Fall  RED  FLAGS: None   WEIGHT BEARING RESTRICTIONS: No  FALLS: Has patient fallen in last 6 months? No  LIVING ENVIRONMENT: Lives with: lives with their family Lives in: House/apartment Stairs: Yes: External: 4 steps; bilateral but cannot reach both Has following equipment at home: shower chair  PLOF: Independent  PATIENT GOALS: "Being normal again. Being able to go back to work and be independent"   OBJECTIVE:  Note: Objective measures were completed at Evaluation unless otherwise noted.  DIAGNOSTIC FINDINGS: MRI of brain on 01/07/24  IMPRESSION: 1. 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 2. Susceptibility related calcifications in the globus pallidus bilaterally. This likely reflects the sequela of chronic microvascular ischemia.  COGNITION: Overall cognitive status: Within functional limits for tasks assessed   SENSATION: Pt denies numbness/tingling in L hemibody   MUSCLE TONE: Non-fatiguing clonus of LLE  POSTURE: No Significant postural limitations  LOWER EXTREMITY ROM:     Active  Right Eval Left Eval  Hip flexion    Hip extension    Hip abduction    Hip adduction    Hip internal rotation    Hip external rotation    Knee flexion    Knee extension    Ankle dorsiflexion    Ankle plantarflexion    Ankle inversion    Ankle eversion     (Blank rows = not tested)  LOWER EXTREMITY MMT:  Tested in seated position   MMT Right Eval Left Eval  Hip flexion 5 4+  Hip extension    Hip abduction 5 5  Hip adduction 5 5  Hip internal rotation    Hip external rotation    Knee flexion 5 5  Knee extension 5 4+  Ankle dorsiflexion 5 5  Ankle plantarflexion    Ankle inversion    Ankle eversion    (Blank rows = not tested)  BED MOBILITY:  Not tested  TRANSFERS: Sit to stand: Complete Independence  Assistive device utilized: None     Stand to sit: Complete Independence  Assistive device utilized: None      RAMP:   Not tested  CURB:  Not tested  GAIT: Gait pattern: step through pattern, decreased arm swing- Left, decreased step length- Right, decreased stance time- Left, decreased stride length, decreased hip/knee flexion- Left, lateral hip instability, and wide BOS Distance walked: Various clinic distances  Assistive device utilized: None Level of assistance: Modified independence Comments: Pt walks very guarded, as if she feels unstable. No instability noted other than single instance of L knee buckling which pt able to self-correct.    FUNCTIONAL TESTS:      PATIENT SURVEYS:  ABC scale to be obtained  TREATMENT:   There Act   1/2" wedge in shoe   NMR The following treadmill training was completed for aerobic/neural priming, endurance, and gait speed.  - Warmup: 3:00 up to 1.8 mph - HIIT: 4:00 30sec ON/OFF alternating 1.6 / *** mph   - Cool down: 1:00 at 1.6 mph   Half kneel OH presses  Squat to half kneel     PATIENT EDUCATION: Education details: See self-care  Person educated: Patient Education method: Explanation Education comprehension: verbalized understanding  HOME EXERCISE PROGRAM: To be reviewed from CIR: [id number]   From CIR Therapist: Also instructed pt in 2 additional exercises but no printed versions available: - tall kneeling to half kneeling - sit to half kneeling and then return to sit. Perform to each side.   GOALS: Goals reviewed with patient? Yes  SHORT TERM GOALS: Target date: 02/28/2024   Pt will improve FGA to 28/30 for decreased fall risk   Baseline: 21/30 Goal status: INITIAL  2.  HiMat to be assessed at STG check and LTG updated  Baseline:  Goal status: INITIAL  3.  Pt will improve gait velocity to at least 3.4 ft/s for improved gait efficiency and return to PLOF  Baseline:  Goal status: INITIAL  4.  ABC scale  to be assessed and LTG updated  Baseline:  Goal status: INITIAL  5.  Pt will demonstrate proper lifting technique of 25# weight from floor for proper body mechanics and safety at work Baseline:  Goal status: INITIAL  LONG TERM GOALS: Target date: 03/13/2024   Pt will improve gait velocity to at least 3.8 ft/s for improved gait efficiency and independence  Baseline:  Goal status: INITIAL  2.  HiMat goal  Baseline:  Goal status: INITIAL  3.  ABC scale goal  Baseline:  Goal status: INITIAL  4.  Pt will be able to carry 10# object in BUEs up/down 4 steps for improved coordination and safety in work environment   Baseline: can only carry w/single UE  Goal status: INITIAL  5.  Pt will perform [place] get up w/5# weight for improved shoulder stability OH, core stability and high level balance  Baseline:  Goal status: INITIAL   ASSESSMENT:  CLINICAL IMPRESSION: Emphasis of skilled PT session on functional endurance, eccentric quad strength and pt education. Pt limited by spasticity in LLE today, which pt reports has gotten worse over the past week. Pt w/non-fatiguing clonus of LLE and MAS of 1 in L quad.Pt very frustrated w/impact spasticity has on her ADLs, so recommended pt speak to Dr. Tresia Fruit about this tomorrow. Continue POC.     OBJECTIVE IMPAIRMENTS: Abnormal gait, decreased activity tolerance, decreased balance, decreased coordination, decreased endurance, decreased mobility, decreased strength, increased muscle spasms, impaired UE functional use, improper body mechanics, and pain  ACTIVITY LIMITATIONS: carrying, lifting, squatting, stairs, reach over head, hygiene/grooming, and locomotion level  PARTICIPATION LIMITATIONS: meal prep, cleaning, laundry, interpersonal relationship, driving, shopping, community activity, occupation, and yard work  PERSONAL FACTORS: 1 comorbidity: R internal capsule infarct/R basal ganglia stroke  are also affecting patient's functional outcome.    REHAB POTENTIAL: Excellent  CLINICAL DECISION MAKING: Evolving/moderate complexity  EVALUATION COMPLEXITY: Moderate  PLAN:  PT FREQUENCY:  2-3x/week (starting w/2x following eval)  PT DURATION: 6 weeks  PLANNED INTERVENTIONS: 97164- PT Re-evaluation, 97750- Physical Performance Testing, 97110-Therapeutic exercises, 97530- Therapeutic activity, [id number]- Neuromuscular re-education, 97535- Self Care, 40981- Manual therapy, [id number]- Gait training, [phone]- Canalith repositioning, [phone]- Aquatic Therapy, Patient/Family education, Balance training, Stair training, Taping,  Dry Needling, Joint mobilization, Spinal mobilization, Vestibular training, and DME instructions  PLAN FOR NEXT SESSION: ABC scale and update goal. Review HEP from CIR and update prn. High level agility, elliptical, L quad eccentric strength, OH tasks    Necie Wilcoxson E Ashwini Jago, PT, DPT 02/09/2024, 4:17 PM

## 2024-02-09 NOTE — Patient Instructions (Signed)
 Best Electrode Placement for Arm and Hand Stroke Rehabilitation Electrical stimulation, also referred to as e-stim, NMES, or FES, can be an effective tool in reducing the symptoms of stroke, such as increasing strength and function. The success of one's recovery using electrical stimulation will rely heavily on proper electrode placement.  Listed below are some key video examples of upper limb electrode positioning by Axelgaard. Click on the thumbnail below to visit the video link. Electrode Positions for Common UE Stroke Muscle Groups 1. SHOULDER Retraction - shoulder blade moves back toward spine  2. SHOULDER Flexion (black in front, red in back) - arm comes forward 3. SHOULDER Abduction (red in front, black in back) - arm comes away from body and out to side       4. ELBOW Flexion - elbow bends  5. ELBOW Extension - elbow straightens             6. WRIST/FINGER Extension - wrist comes up and fingers straighten  7. FINGER Flexion - fingers bend             8. THUMB Abduction - thumb moves away from palm  9. THUMB Opposition - thumb moves toward pad of index finger   Information obtained from [email or web address]/electrode-[email or web address] Please visit this website for videos and additional information as needed.

## 2024-02-09 NOTE — Therapy (Signed)
 OUTPATIENT OCCUPATIONAL THERAPY NEURO Treatment  Patient Name: MALI EPPARD MRN: [id number] DOB:07/20/1996, 28 y.o., female Today's Date: 02/09/2024  PCP: Gabriel John, NP  REFERRING PROVIDER: Genetta Kenning, MD   END OF SESSION:  OT End of Session - 02/09/24 1637     Visit Number 2    Number of Visits 13   including eval   Date for OT Re-Evaluation 04/06/24    Authorization Type [employer] [facility]) 2025    OT Start Time 1449    OT Stop Time 1530    OT Time Calculation (min) 41 min    Activity Tolerance Patient tolerated treatment well    Behavior During Therapy [facility] for tasks assessed/performed              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   Acute flank pain 06/02/2022   Angio-edema 04/27/2016   Angioedema    Aquagenic angio-edema-urticaria    Asthma    no problems /not used recently   Dysautonomia (HCC)    Dysrhythmia    sinus tachycardia   Fibroid    right breast, adenoma   Headache(784.0)    History of COVID-19 11/21/2020   Hives 12/22/2015   Pneumonia    hx  6th grade   Sensation of fullness in both ears 02/02/2023   Snoring 02/11/2022   Urinary frequency 02/12/2019   Vaginal yeast infection 06/02/2022   Vasculitis (HCC)    Past Surgical History:  Procedure Laterality Date   ADENOIDECTOMY     BREAST LUMPECTOMY Right    BREAST LUMPECTOMY Right    MASS EXCISION Right 10/07/2014   Procedure: EXCISION OF RIGHT BREAST MASS;  Surgeon: Adalberto Hollow, MD;  Location: [facility] OR;  Service: General;  Laterality: Right;   TONSILLECTOMY AND ADENOIDECTOMY     TOOTH EXTRACTION     TRANSESOPHAGEAL ECHOCARDIOGRAM (CATH LAB) N/A 01/10/2024   Procedure: TRANSESOPHAGEAL ECHOCARDIOGRAM;  Surgeon: Hugh Madura, MD;  Location: [facility] INVASIVE CV LAB;  Service: Cardiovascular;  Laterality: N/A;   Patient Active Problem List   Diagnosis Date Noted   Coping style affecting medical condition 01/19/2024    History of CVA (cerebrovascular accident) 01/12/2024   Stroke of right basal ganglia (HCC) 01/07/2024   Non-restorative sleep 05/26/2022   Sleep paralysis, 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   Vivid dream 05/26/2022   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   Excessive daytime sleepiness 05/26/2022   Sleep disturbance 03/17/2022   Panic attacks 02/11/2022   Vitamin B12 deficiency 07/30/2021   Vitamin D  deficiency 07/30/2021   Family history of hypothyroidism 07/30/2021   GAD (generalized anxiety disorder) 03/18/2020   Fatigue 11/06/2019   Chronic back pain 07/06/2019   Preventative health care 04/27/2018   Migraines 04/25/2018   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   Anemia 01/04/2018   Headache disorder 12/29/2015   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   Sinus tachycardia 12/22/2015   Dysautonomia (HCC) 12/22/2015   Dysautonomia, familial (HCC) 04/22/2015   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   Neurocardiogenic syncope 04/06/2013   Intermittent palpitations 04/06/2013    ONSET DATE: 01/26/2024 (referral date)  REFERRING DIAG: I63.81 (ICD-10-CM) - Other cerebral infarction due to occlusion or stenosis of small artery   THERAPY DIAG:  Muscle weakness (generalized)  Other lack of coordination  Hemiplegia and hemiparesis following cerebral infarction affecting left non-dominant side (HCC)  Other symptoms and signs involving the musculoskeletal system  Other symptoms and signs involving the nervous system  Rationale for Evaluation and Treatment: Rehabilitation  SUBJECTIVE:   SUBJECTIVE STATEMENT: Pronunciation: "Bree-Anne-ah"  Pt reported Baclofen  makes pt sleepy and typically takes only 2x per day. Pt reported L arm "about the same." Pt brought personal e-stim unit to session.  Per 01/31/24 PT Eval: "Pt presents w/mother, not using AD. Having difficulty w/ADLs (washing hair, putting hair in pony tail, etc.). Pt reports she has poor endurance and difficulty lifting her  LUE above her head. States she experienced some spasticity in her LLE when walking in the cold weather. Denies falls. Not sleeping super well - having bad dreams."  Pt accompanied by: self and friend Ivin Marrow)  PERTINENT HISTORY: familiar hemiplegic migraine, angioedema 2017,dysautonomia; infarct right internal capsule/ R basal ganglia stroke   MRI of brain on 01/07/24   IMPRESSION: 1. 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 2. Susceptibility related calcifications in the globus pallidus bilaterally. This likely reflects the sequela of chronic microvascular ischemia.  PRECAUTIONS: Fall  WEIGHT BEARING RESTRICTIONS: No  PAIN:  Are you having pain? Yes: NPRS scale: 2/10 Pain location: upper back pain Pain description: aching Aggravating factors: sitting for prolonged periods of time, raising L arm Relieving factors: rest  FALLS: Has patient fallen in last 6 months? Yes when CVA event occurred though none since then   LIVING ENVIRONMENT: Lives with: lives with their family Lives in: House/apartment Stairs: Yes: External: 4 steps; bilateral but cannot reach both Has following equipment at home: shower chair  PLOF: Independent, typically living ind, worked as Engineer, civil (consulting)  PATIENT GOALS: "I want to get my hand and arm back and be able to go to work."  OBJECTIVE:  Note: Objective measures were completed at Evaluation unless otherwise noted.  HAND DOMINANCE: Right  ADLs: Overall ADLs:  Transfers/ambulation related to ADLs: Eating: ind Grooming: difficulty with completing hair styling e.g. putting hair up in ponytail, difficulty with curling hair. Uses RUE to complete toothbrushing and brushing hair.  Difficulty with clipping fingernails. UB Dressing: ind, extra time. Buttons and zippers difficult.  LB Dressing: Ind, difficulty with pulling up pants and buttoning buttons on jeans, extra time for tying shoes Toileting: ind  Bathing:  difficulty washing hair d/t unable to apply pressure while moving LUE, typically uses RUE to ensure thoroughness, shower bench is available PRN Tub Shower transfers: walk-in shower, pt reported SOB after showering or when walking a lot, sometimes requires rest breaks Equipment: Shower seat with back and Walk in shower  IADLs: Shopping: assistance from parents but pt participates, such as trying to reach for items on shelf and pushing cart Light housekeeping: pt completes dishwashing, has not attempted laundry Meal Prep: pt reported cooking but task is tiring Community mobility: has attempted to drive 1x with parent  Handwriting:  No change per pt.   Work activities: Pt expressed concerns with FM coordination and concerns about return to work considerations, such as coordination required for drawing up syringes from a vial for medication administration, completing IVs, typing, navigating phone.   MOBILITY STATUS: Independent  POSTURE COMMENTS:  Ind sitting balance, sometimes forward shoulder posture  ACTIVITY TOLERANCE: Activity tolerance: Pt reported "I'm doing more than I was doing in the hospital, but I definitely get tired easier and take more breaks."  FUNCTIONAL OUTCOME MEASURES: PSF: 3.0    Total score = sum of the activity scores/number of activities Minimum detectable change (90%CI) for average score = 2 points Minimum detectable change (90%CI) for single activity score = 3  points   UPPER EXTREMITY ROM:    Active ROM Right eval Left eval  Shoulder flexion [facility]  Shoulder abduction  [facility]  Shoulder adduction    Shoulder extension    Shoulder internal rotation    Shoulder external rotation    Elbow flexion  WFL - slightly delayed movements  Elbow extension    Wrist flexion    Wrist extension    Wrist ulnar deviation    Wrist radial deviation    Wrist pronation    Wrist supination    (Blank rows = not tested)  OT palpated pt's L shoulder and noted "tightness"  of muscles at L shoulder.   UPPER EXTREMITY MMT:     MMT Right eval Left eval  Shoulder flexion 5 3+  Shoulder abduction    Shoulder adduction    Shoulder extension    Shoulder internal rotation    Shoulder external rotation    Middle trapezius    Lower trapezius    Elbow flexion    Elbow extension    Wrist flexion    Wrist extension    Wrist ulnar deviation    Wrist radial deviation    Wrist pronation    Wrist supination    (Blank rows = not tested)  HAND FUNCTION: Grip strength: Right: 48.9, 53.5, 53.3 (51.9 lbs average) lbs; Left: 24.2, 22.2, 22.7 (23.1 lbs average) lbs  COORDINATION: 9 Hole Peg test: Right: 20 sec; Left: 40 sec  Pt reported practicing picking up pills, sorting change, and playing connect-4 at home.  SENSATION: Pt reported tingling in L thumb which has since resolves. Pt questioned if symptoms might have been from resting hand splint at night. Pt reported no other symptoms of numbness/tingling.  EDEMA: None noted  MUSCLE TONE: RUE: Within functional limits and LUE: slight ataxia  COGNITION: Overall cognitive status: Within functional limits for tasks assessed  Pt reported some difficulty with word-finding.  Pt reported sometimes asking same question again "a few hours apart." Pt's mother reported not noticing this recently.  VISION: Subjective report: Pt reported initial changes to vision (unable to read, blurry vision close-up and far away) though "okay now."  Baseline vision: No visual deficits Visual history:  none noted  VISION ASSESSMENT: Pt accurately clock on wall and reported reading books on Kindle. Pt reported still using large print on phone since hospital.   Pt tracked easily to all 4 quadrants. Peripheral vision - WFL B sides, ?slightly decreased on L side though functional regardless  Patient has difficulty with following activities due to following visual impairments: None noted  PERCEPTION: Not tested  PRAXIS: Not  tested  OBSERVATIONS: Pt ambulated ind. Pt soft-spoken and appeared tired during session. Pt was pleasant and appeared motivated to complete LUE functional tasks at home based on pt report.  TREATMENT DATE:     TherEx HEP Update: Affected UE ROM - to improve upright seated posture, to improve affected UE ROM. Pt returned demo though benefited from min v/c for posture and to avoid compensatory movements.  Access Code: [email or web address] URL: [email or web address] Date: 02/09/2024 Prepared by: Daina Drum  Exercises - Seated Scapular Retraction  - 2 x daily - 1 sets - 10 reps - Seated Shoulder Rolls  - 2 x daily - 1 sets - 10 reps - Seated Shoulder Shrug  - 2 x daily - 1 sets - 10 reps - Seated Shoulder Flexion Full Range  - 2 x daily - 1 sets - 5-7 reps - Seated Single Arm Shoulder Abduction - Thumb Up  - 2 x daily - 1 sets - 5-7 reps - Seated Elbow Flexion and Extension AROM  - 2 x daily - 1 sets - 10 reps - Wrist AROM Flexion Extension  - 2 x daily - 1 sets - 10 reps   TherAct HEP update: yellow theraputty - to improve affected UE strengthening, dexterity, FM coordination. Pt returned demo. Access Code: [email or web address] - Putty Squeezes  - 2 x daily - 1 sets - 5 reps - Rolling Putty on Table  - 2 x daily - 1 sets - 5 reps - Tip Pinch with Putty  - 2 x daily - 1 sets - 5 reps - 3-Point Pinch with Putty  - 2 x daily - 1 sets - 5 reps - Key Pinch with Putty  - 2 x daily - 1 sets - 5 reps  Neuro Re-Ed Per pt request, OT educated pt on pt's personal e-stim unit. OT educated pt edu NMES vs TENS settings, purpose of e-stim, placement of electrodes, precautions/contraindications, UE anatomy. Handout provided, see pt instructions. OT recommended to pt to ask doctor about using e-stim unit after placement of heart monitor. Pt acknowledged understanding of all.  NMES  application - to improve affected UE AAROM, proprioception, for sensory input. Therapist educated patient on application for affected UE wrist and digit extension/flexion. Patient able to teach back information provided and tolerated supervised application to promote improved AROM and pain management. Therapist assisting patient with marking of electrode placement on arm to help with carryover outside of therapy.  Modalities Skin Integrity Assessment:  NMES utilized: No redness, irritation or skin integrity concerns were noted before, during and/or after use. Patient was informed of risks and benefits to treatment today. Skin integrity prior to treatment: Intact. Skin integrity after treatment: Intact.  PATIENT EDUCATION: Education details: see today's tx above Person educated: Patient and pt's parent Education method: Explanation Education comprehension: verbalized understanding  HOME EXERCISE PROGRAM: 02/09/24 - Affected UE ROM and Yellow Theraputty. Access Code: [email or web address].   GOALS: Goals reviewed with patient? Yes  SHORT TERM GOALS: Target date: 03/09/24  Pt will ind demo understanding of HEP using visual handouts. Baseline: new to outpt OT Goal status: INITIAL  2.  Pt will ind recall at least 3 energy conservation strategies. Baseline: Activity tolerance: Pt reported "I'm doing more than I was doing in the hospital, but I definitely get tired easier and take more breaks."  Goal status: INITIAL  3.  Pt and/or family will return demo of personal e-stim unit use and electrode placement for affected UE.  Baseline: pt reported she has ordered a personal e-stim unit.  02/09/24 - Education and handout provided. Pt returned demo of using personal e-stim unit for wrist flex/ext of affected UE. Goal status: MET  4.  Pt will report improved ability to use LUE during overhead reaching tasks with minimal use of RUE to compensate, such as hair care tasks and washing hair. Baseline: pt reported  difficulty with hair styling (e.g. putting hair in ponytail, curling hair) Goal status: INITIAL  LONG TERM GOALS: Target date: 04/06/24  Patient will report at least two-point increase in average PSFS score or at least three-point increase in a single activity score indicating functionally significant improvement given minimum detectable change using adaptive strategies and A/E PRN. Baseline: PSF: 3.0 total score (See above for individual activity scores)  Goal status: INITIAL  2.  Patient will demonstrate at least 30 lbs LUE grip strength as needed to open jars and other containers.  Baseline: Grip strength: Right: 48.9, 53.5, 53.3 (51.9 lbs average) lbs; Left: 24.2, 22.2, 22.7 (23.1 lbs average) lbs Goal status: INITIAL  3.  Patient will demo improved FM coordination as evidenced by completing nine-hole peg with use of LUE in 30 seconds or less.  Baseline: 9 Hole Peg test: Right: 20 sec; Left: 40 sec Goal status: INITIAL  4.  Pt will verbalize understanding of A/E, adaptive strategies for functional tasks as needed for return to work considerations. Baseline: Work activities: Pt expressed concerns with FM coordination and concerns about return to work considerations, such as coordination required for drawing up syringes from a vial for medication administration, completing IVs, typing, navigating phone.  Goal status: INITIAL  ASSESSMENT:  CLINICAL IMPRESSION: Pt tolerated tasks well and met 1 STG today. Pt returned demo for electrode placement and e-stim unit usage to achieve AAROM wrist ext/flex. Pt continues to benefit from v/c for upright seated posture. Pt tolerated HEP update for affected UE ROM as well. Pt would benefit from skilled OT services in the outpatient setting to work on impairments as noted below to help pt return to PLOF as able.     PERFORMANCE DEFICITS: in functional skills including ADLs, IADLs, coordination, dexterity, proprioception, sensation, tone, ROM, strength,  pain, muscle spasms, flexibility, Fine motor control, Gross motor control, mobility, balance, endurance, and UE functional use, cognitive skills including attention and energy/drive, and psychosocial skills including environmental adaptation.   IMPAIRMENTS: are limiting patient from ADLs, IADLs, rest and sleep, work, leisure, and social participation.   CO-MORBIDITIES: may have co-morbidities  that affects occupational performance. Patient will benefit from skilled OT to address above impairments and improve overall function.  MODIFICATION OR ASSISTANCE TO COMPLETE EVALUATION: Min-Moderate modification of tasks or assist with assess necessary to complete an evaluation.  OT OCCUPATIONAL PROFILE AND HISTORY: Detailed assessment: Review of records and additional review of physical, cognitive, psychosocial history related to current functional performance.  CLINICAL DECISION MAKING: Moderate - several treatment options, min-mod task modification necessary  REHAB POTENTIAL: Good  EVALUATION COMPLEXITY: Moderate    PLAN:  OT FREQUENCY: 2x/week  OT DURATION: 6 weeks (dates extended to allow for scheduling)  PLANNED INTERVENTIONS: 60454 OT Re-evaluation, 97535 self care/ADL training, 09811 therapeutic exercise, 97530 therapeutic activity, 97112 neuromuscular re-education, 97140 manual therapy, 97035 ultrasound, 97018 paraffin, 91478 fluidotherapy, 97010 moist heat, 97010 cryotherapy, 97760 Orthotic Initial, 97761 Prosthetic Initial, 97763 Orthotic/Prosthetic subsequent, passive range of motion, functional mobility training, visual/perceptual remediation/compensation, energy conservation, patient/family education, and DME and/or AE instructions  RECOMMENDED OTHER SERVICES: PT eval completed, SLP eval scheduled  CONSULTED AND AGREED WITH PLAN OF CARE: Patient and family member  PLAN FOR NEXT SESSION:  Functional use of LUE: e.g. clipping nails, putting hair in ponytail, grooming, navigating  phone, typing,  see PSFS items Review ROM and theraputty HEP PRN Initiate HEP: FM coordination  Energy conservation handout E-stim unit handout for electrode placement - how did it go?  Oakley Bellman, OT 02/09/2024, 4:47 PM

## 2024-02-10 ENCOUNTER — Encounter: Payer: Self-pay | Admitting: Physical Medicine & Rehabilitation

## 2024-02-10 ENCOUNTER — Encounter: Attending: Physical Medicine & Rehabilitation | Admitting: Physical Medicine & Rehabilitation

## 2024-02-10 VITALS — BP 106/73 | HR 73 | Ht 67.0 in | Wt 159.0 lb

## 2024-02-10 DIAGNOSIS — I639 Cerebral infarction, unspecified: Secondary | ICD-10-CM | POA: Insufficient documentation

## 2024-02-10 DIAGNOSIS — G8114 Spastic hemiplegia affecting left nondominant side: Secondary | ICD-10-CM | POA: Diagnosis not present

## 2024-02-10 NOTE — Progress Notes (Signed)
 Subjective:    Patient ID: Faith Jordan, female    DOB: 1996/08/05, 28 y.o.   MRN: [id number]  Faith Jordan was admitted to rehab 01/12/2024 for inpatient therapies to consist of PT, ST and OT at least three hours five days a week. Past admission physiatrist, therapy team and rehab RN have worked together to provide customized collaborative inpatient rehab.  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  She continued to progress nicely with therapies followed by neurology services.  Maintained on Lovenox  for DVT prophylaxis.  Plavix  ongoing for CVA prophylaxis no aspirin due to NSAID allergy.  Mood stabilization with the use of Prozac  as well as melatonin.  She was using Ativan  only as needed for for anxiety.  In regards to patient's history of hemiplegic migraine she was followed by neurology services Dr.Ahern currently maintained on Topamax .  She had been on Nurtec as well as Emgality  prior to admission.  Lipitor  ongoing for hyperlipidemia.  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  Mild hypokalemia maintained on low-dose potassium supplement with lattest potassium 3.6    28 y.o. 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  Per chart review lives alone independent prior to admission working as an ED nurse at [employer].  She plans to stay at her mother's on discharge.  Presented 01/07/2024 with acute onset of left-sided weakness and facial droop.  She denied any headache.  Cranial CT scan showed no acute intracranial abnormality.  CTA of the head and neck unremarkable.  Patient did not receive tPA.  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  Admission chemistries unremarkable except WBC 12,700, urine drug screen negative.  Beta-2  glycoprotein and lupus anticoagulant negative.  Protein S and C activity and normal Antithrombin III  normal.  Echocardiogram with ejection fraction of 60 to 65% no wall motion abnormalities.  TEE completed showing ejection fraction 65% no atrial appendage or thrombus.  Maintained on Plavix  for CVA prophylaxis allergies to NSAIDs thus was not placed on aspirin.  Lovenox  added for DVT prophylaxis.  She was currently on Topamax  for her headaches.  Therapy evaluations completed due to patient decreased functional mobility left-sided weakness was admitted for a comprehensive rehab program.    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  She has followed up with her primary care physician as well as her neurologist.  She was started on baclofen  10 mg 3 times daily by neurology.  She feels like this has helped with lower extremity spasms.  She feels a little tired from this.  She did have some blood work done by her primary care physician and her LDL fell markedly from 10 4-09 since being on 80 mg of atorvastatin  per day.  The patient complains of fatigue during the day does not feel that she has energy to get around much she gets visibly winded after walking around a larger store. She is noting some coordination issues with her left hand but not much problem with spasticity.  She feels like  her sensation is equal both upper and lower limbs. Pain Inventory Average Pain 2 Pain Right Now 2 My pain is intermittent and dull  LOCATION OF PAIN  knee  BOWEL Number of stools per week: 4 Oral laxative use No   Type of laxative . Enema or suppository use No  History of colostomy No  Incontinent No   BLADDER Normal In and out cath, frequency . Able to self cath  . Bladder incontinence No  Frequent urination No  Leakage with coughing No  Difficulty starting stream No  Incomplete bladder emptying No    Mobility walk without assistance how many minutes can you walk? 30 ability to climb steps?  yes do you drive?  no  Function disabled: date disabled . I need assistance with the following:  meal prep, household duties, and shopping  Neuro/Psych weakness trouble walking  Prior Studies TC appt  Physicians involved in your care TC appt   Family History  Problem Relation Age of Onset   Depression Mother    Migraines Mother    Narcolepsy Mother    Hypothyroidism Mother    Myasthenia gravis Maternal Grandfather    Breast cancer Maternal Grandmother 55   Asthma Other    Cancer Other    Epilepsy Other    Allergic rhinitis Neg Hx    Angioedema Neg Hx    Atopy Neg Hx    Eczema Neg Hx    Immunodeficiency Neg Hx    Urticaria Neg Hx    Social History   Socioeconomic History   Marital status: Significant Other    Spouse name: Not on file   Number of children: Not on file   Years of education: Not on file   Highest education level: Bachelor's degree (e.g., BA, AB, BS)  Occupational History   Not on file  Tobacco Use   Smoking status: Never   Smokeless tobacco: Never  Vaping Use   Vaping status: Never Used  Substance and Sexual Activity   Alcohol use: No   Drug use: No   Sexual activity: Yes  Other Topics Concern   Not on file  Social History Narrative   Lives with fiance   R handed   Caffeine : 1 drink a day   Social Drivers of Corporate investment banker Strain: Not on file  Food Insecurity: No Food Insecurity (01/09/2024)   Hunger Vital Sign    Worried About Running Out of Food in the Last Year: Never true    Ran Out of Food in the Last Year: Never true   Transportation Needs: No Transportation Needs (01/09/2024)   PRAPARE - Administrator, Civil Service (Medical): No    Lack of Transportation (Non-Medical): No  Physical Activity: Not on file  Stress: Not on file  Social Connections: Not on file   Past Surgical History:  Procedure Laterality Date   ADENOIDECTOMY     BREAST LUMPECTOMY Right    BREAST LUMPECTOMY Right    MASS EXCISION Right 10/07/2014   Procedure: EXCISION OF RIGHT BREAST MASS;  Surgeon: Adalberto Hollow, MD;  [facility];  Service: General;  Laterality: Right;   TONSILLECTOMY AND ADENOIDECTOMY     TOOTH EXTRACTION     TRANSESOPHAGEAL ECHOCARDIOGRAM (CATH LAB) N/A 01/10/2024   Procedure: TRANSESOPHAGEAL ECHOCARDIOGRAM;  Surgeon: Hugh Madura, MD;  Location: [facility] INVASIVE CV LAB;  Service: Cardiovascular;  Laterality: N/A;   Past Medical History:  Diagnosis Date   Acne    Acquired breast  deformity 12/22/2015   Acute flank pain 06/02/2022   Angio-edema 04/27/2016   Angioedema    Aquagenic angio-edema-urticaria    Asthma    no problems /not used recently   Dysautonomia [facility])    Dysrhythmia    sinus tachycardia   Fibroid    right breast, adenoma   Headache(784.0)    History of COVID-19 11/21/2020   Hives 12/22/2015   Pneumonia    hx  6th grade   Sensation of fullness in both ears 02/02/2023   Snoring 02/11/2022   Urinary frequency 02/12/2019   Vaginal yeast infection 06/02/2022   Vasculitis (HCC)    BP 106/73   Pulse 73   Ht 5\' 7"  (1.702 m)   Wt 159 lb (72.1 kg)   LMP 01/08/2024 (Exact Date)   SpO2 98%   BMI 24.90 kg/m   Opioid Risk Score:   Fall Risk Score:  `1  Depression screen [facility] 2/9     02/10/2024    1:44 PM 02/08/2024    8:36 AM 11/29/2023    8:08 AM 06/10/2022    2:28 PM 06/02/2022    8:27 AM 07/30/2021   10:19 AM 02/03/2021    9:41 AM  Depression screen PHQ 2/9  Decreased Interest 2 1 0  0 0 0  Down, Depressed, Hopeless 2 2 0  0 0 0  PHQ - 2 Score 4 3 0  0 0 0   Altered sleeping 2 0 0  0 0 0  Tired, decreased energy 2 2 0  3 0 1  Change in appetite 2 0 0  0 0 0  Feeling bad or failure about yourself  2 0 0  0 0 0  Trouble concentrating 0 0 0  0 0 0  Moving slowly or fidgety/restless 0 2 0  0 0 0  Suicidal thoughts 0 0 0  0 0 0  PHQ-9 Score 12 7 0  3 0 1  Difficult doing work/chores Very difficult Not difficult at all Not difficult at all  Not difficult at all Not difficult at all Somewhat difficult     Information is confidential and restricted. Go to Review Flowsheets to unlock data.     Review of Systems  All other systems reviewed and are negative.     Objective:   Physical Exam  General no acute distress Mood affect appropriate Motor strength is 4/5 in the left deltoid bicep tricep grip as well as hip flexor knee extensor ankle dorsiflexor 5/5 in the right deltoid, bicep, tricep, grip, hip flexor, knee extensor, ankle dorsiflexion plantarflexion She is able to perform normal gait without evidence of foot drag she is unable to do heel walking or toe walking on the left side and has difficulty with tandem gait Sensation is intact to light touch and pinprick bilateral upper and lower limbs Tone deep tendon reflexes 3+ left biceps triceps brachial radialis. DTR 3+ left patellar and has sustained clonus at the left ankle.  3+ at the right knee and right ankle and 1+ in the right brachioradialis biceps and triceps Speech without dysarthria Visual fields are intact      Assessment & Plan:  1.  Right posterior limb of the internal capsule infarct with resultant left spastic hemiparesis she feels symptomatically better with the baclofen  feels a little tired from this as well. Do not see need for spasticity management with botulinum toxin. She is continuing her outpatient therapy and has that scheduled through the beginning of June. We discussed  no work until that time We discussed she can gradually resume driving as below Graduated return  to driving instructions were provided. It is recommended that the patient first drives with another licensed driver in an empty parking lot. If the patient does well with this, and they can drive on a quiet street with the licensed driver. If the patient does well with this they can drive on a busy street with a licensed driver. If the patient does well with this, the next time out they can go by himself. For the first month after resuming driving, I recommend no nighttime or Interstate driving.   Continue outpatient PT and OT She is undergoing up outpatient speech therapy evaluation although unlikely that she will need any long-term treatment with this  I will see her back in approximately 6 weeks Discussed stretching left ankle on daily basis with Achilles stretching exercises.  I demonstrated these to the patient and she demonstrated back.  We discussed her fatigue part of this is related to her stroke some may be medication related.  I recommended coenzyme every 10 supplement given high-dose Lipitor .  I said she may discuss her Lipitor  dose with PCP given marked reduction in her LDL over the last month.

## 2024-02-10 NOTE — Patient Instructions (Addendum)
 Co Q 10 supplement 200mg    Ankle (achilles stretch) every day 1-2 minutes per side

## 2024-02-11 DIAGNOSIS — R002 Palpitations: Secondary | ICD-10-CM | POA: Diagnosis not present

## 2024-02-11 DIAGNOSIS — I639 Cerebral infarction, unspecified: Secondary | ICD-10-CM | POA: Diagnosis not present

## 2024-02-13 ENCOUNTER — Ambulatory Visit: Admitting: Occupational Therapy

## 2024-02-13 ENCOUNTER — Ambulatory Visit: Admitting: Physical Therapy

## 2024-02-13 ENCOUNTER — Telehealth: Payer: Self-pay | Admitting: Neurology

## 2024-02-13 DIAGNOSIS — M6281 Muscle weakness (generalized): Secondary | ICD-10-CM | POA: Diagnosis not present

## 2024-02-13 DIAGNOSIS — R278 Other lack of coordination: Secondary | ICD-10-CM | POA: Diagnosis not present

## 2024-02-13 DIAGNOSIS — R29818 Other symptoms and signs involving the nervous system: Secondary | ICD-10-CM | POA: Diagnosis not present

## 2024-02-13 DIAGNOSIS — I69354 Hemiplegia and hemiparesis following cerebral infarction affecting left non-dominant side: Secondary | ICD-10-CM | POA: Diagnosis not present

## 2024-02-13 DIAGNOSIS — R2681 Unsteadiness on feet: Secondary | ICD-10-CM

## 2024-02-13 DIAGNOSIS — R2689 Other abnormalities of gait and mobility: Secondary | ICD-10-CM | POA: Diagnosis not present

## 2024-02-13 DIAGNOSIS — R29898 Other symptoms and signs involving the musculoskeletal system: Secondary | ICD-10-CM

## 2024-02-13 NOTE — Patient Instructions (Addendum)
 Faith Jordan

## 2024-02-13 NOTE — Telephone Encounter (Signed)
 Pt came in for her note to submit for her critical illness claim. I had her complete release of info form and left in [place] bin. Pt would like note sent via [facility] or can PU on Wednesday when she is at PT. Either way, please call her and let her know how she will receive.

## 2024-02-13 NOTE — Therapy (Addendum)
 OUTPATIENT OCCUPATIONAL THERAPY NEURO Treatment  Patient Name: Faith Jordan MRN: [id number] DOB:[birth year], 28 y.o., female Today's Date: 02/13/2024  PCP: Gabriel John, NP  REFERRING PROVIDER: Genetta Kenning, MD   END OF SESSION:  OT End of Session - 02/13/24 1332     Visit Number 3    Number of Visits 13   including eval   Date for OT Re-Evaluation 04/06/24    Authorization Type [employer] [facility]) 2025    OT Start Time 0848    OT Stop Time 0926    OT Time Calculation (min) 38 min    Activity Tolerance Patient tolerated treatment well    Behavior During Therapy [facility] for tasks assessed/performed               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   Acute flank pain 06/02/2022   Angio-edema 04/27/2016   Angioedema    Aquagenic angio-edema-urticaria    Asthma    no problems /not used recently   Dysautonomia (HCC)    Dysrhythmia    sinus tachycardia   Fibroid    right breast, adenoma   Headache(784.0)    History of COVID-19 11/21/2020   Hives 12/22/2015   Pneumonia    hx  6th grade   Sensation of fullness in both ears 02/02/2023   Snoring 02/11/2022   Urinary frequency 02/12/2019   Vaginal yeast infection 06/02/2022   Vasculitis (HCC)    Past Surgical History:  Procedure Laterality Date   ADENOIDECTOMY     BREAST LUMPECTOMY Right    BREAST LUMPECTOMY Right    MASS EXCISION Right 10/07/2014   Procedure: EXCISION OF RIGHT BREAST MASS;  Surgeon: Adalberto Hollow, MD;  Location: [facility] OR;  Service: General;  Laterality: Right;   TONSILLECTOMY AND ADENOIDECTOMY     TOOTH EXTRACTION     TRANSESOPHAGEAL ECHOCARDIOGRAM (CATH LAB) N/A 01/10/2024   Procedure: TRANSESOPHAGEAL ECHOCARDIOGRAM;  Surgeon: Hugh Madura, MD;  Location: [facility] INVASIVE CV LAB;  Service: Cardiovascular;  Laterality: N/A;   Patient Active Problem List   Diagnosis Date Noted   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) 02/10/2024   Coping style affecting medical condition 01/19/2024   History of CVA (cerebrovascular accident) 01/12/2024   Stroke of right basal ganglia (HCC) 01/07/2024   Non-restorative sleep 05/26/2022   Sleep paralysis, 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   Vivid dream 05/26/2022   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   Excessive daytime sleepiness 05/26/2022   Sleep disturbance 03/17/2022   Panic attacks 02/11/2022   Vitamin B12 deficiency 07/30/2021   Vitamin D  deficiency 07/30/2021   Family history of hypothyroidism 07/30/2021   GAD (generalized anxiety disorder) 03/18/2020   Fatigue 11/06/2019   Chronic back pain 07/06/2019   Preventative health care 04/27/2018   Migraines 04/25/2018   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   Anemia 01/04/2018   Headache disorder 12/29/2015   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   Sinus tachycardia 12/22/2015   Dysautonomia (HCC) 12/22/2015   Dysautonomia, familial (HCC) 04/22/2015   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   Neurocardiogenic syncope 04/06/2013   Intermittent palpitations 04/06/2013    ONSET DATE: 01/26/2024 (referral date)  REFERRING DIAG: I63.81 (ICD-10-CM) - Other cerebral infarction due to occlusion or stenosis of small artery   THERAPY DIAG:  Muscle weakness (generalized)  Other lack of coordination  Hemiplegia and hemiparesis following cerebral infarction affecting left non-dominant side (HCC)  Other symptoms  and signs involving the musculoskeletal system  Other symptoms and signs involving the nervous system  Rationale for Evaluation and Treatment: Rehabilitation  SUBJECTIVE:   SUBJECTIVE STATEMENT: Pronunciation: "Bree-Anne-ah"  Pt reported "good." Pt reported practicing driving while supervised 3x, approx. 3 miles distances. Pt reported trying e-stim though now has heart monitor and hasn't attempted e-stim since getting heart monitor. Pt plans to complete e-stim between heart  monitor replacements every 5 days or so.  Per 01/31/24 PT Eval: "Pt presents w/mother, not using AD. Having difficulty w/ADLs (washing hair, putting hair in pony tail, etc.). Pt reports she has poor endurance and difficulty lifting her LUE above her head. States she experienced some spasticity in her LLE when walking in the cold weather. Denies falls. Not sleeping super well - having bad dreams."  Pt accompanied by: self and friend Ivin Marrow)  PERTINENT HISTORY: familiar hemiplegic migraine, angioedema 2017,dysautonomia; infarct right internal capsule/ R basal ganglia stroke   MRI of brain on 01/07/24   IMPRESSION: 1. 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 2. Susceptibility related calcifications in the globus pallidus bilaterally. This likely reflects the sequela of chronic microvascular ischemia.  PRECAUTIONS: Fall, Per 02/10/24 MD Office Visit: Pt may gradually begin to return to driving.   WEIGHT BEARING RESTRICTIONS: No  PAIN:  Are you having pain? No  FALLS: Has patient fallen in last 6 months? Yes when CVA event occurred though none since then   LIVING ENVIRONMENT: Lives with: lives with their family Lives in: House/apartment Stairs: Yes: External: 4 steps; bilateral but cannot reach both Has following equipment at home: shower chair  PLOF: Independent, typically living ind, worked as Engineer, civil (consulting)  PATIENT GOALS: "I want to get my hand and arm back and be able to go to work."  OBJECTIVE:  Note: Objective measures were completed at Evaluation unless otherwise noted.  HAND DOMINANCE: Right  ADLs: Overall ADLs:  Transfers/ambulation related to ADLs: Eating: ind Grooming: difficulty with completing hair styling e.g. putting hair up in ponytail, difficulty with curling hair. Uses RUE to complete toothbrushing and brushing hair.  Difficulty with clipping fingernails. UB Dressing: ind, extra time. Buttons and zippers difficult.  LB  Dressing: Ind, difficulty with pulling up pants and buttoning buttons on jeans, extra time for tying shoes Toileting: ind  Bathing: difficulty washing hair d/t unable to apply pressure while moving LUE, typically uses RUE to ensure thoroughness, shower bench is available PRN Tub Shower transfers: walk-in shower, pt reported SOB after showering or when walking a lot, sometimes requires rest breaks Equipment: Shower seat with back and Walk in shower  IADLs: Shopping: assistance from parents but pt participates, such as trying to reach for items on shelf and pushing cart Light housekeeping: pt completes dishwashing, has not attempted laundry Meal Prep: pt reported cooking but task is tiring Community mobility: has attempted to drive 1x with parent  Handwriting:  No change per pt.   Work activities: Pt expressed concerns with FM coordination and concerns about return to work considerations, such as coordination required for drawing up syringes from a vial for medication administration, completing IVs, typing, navigating phone.   MOBILITY STATUS: Independent  POSTURE COMMENTS:  Ind sitting balance, sometimes forward shoulder posture  ACTIVITY TOLERANCE: Activity tolerance: Pt reported "I'm doing more than I was doing in the hospital, but I definitely get tired easier and take more breaks."  FUNCTIONAL OUTCOME MEASURES: PSF: 3.0    Total score = sum of the activity  scores/number of activities Minimum detectable change (90%CI) for average score = 2 points Minimum detectable change (90%CI) for single activity score = 3 points   UPPER EXTREMITY ROM:    Active ROM Right eval Left eval  Shoulder flexion [facility]  Shoulder abduction  [facility]  Shoulder adduction    Shoulder extension    Shoulder internal rotation    Shoulder external rotation    Elbow flexion  WFL - slightly delayed movements  Elbow extension    Wrist flexion    Wrist extension    Wrist ulnar deviation    Wrist radial  deviation    Wrist pronation    Wrist supination    (Blank rows = not tested)  OT palpated pt's L shoulder and noted "tightness" of muscles at L shoulder.   UPPER EXTREMITY MMT:     MMT Right eval Left eval  Shoulder flexion 5 3+  Shoulder abduction    Shoulder adduction    Shoulder extension    Shoulder internal rotation    Shoulder external rotation    Middle trapezius    Lower trapezius    Elbow flexion    Elbow extension    Wrist flexion    Wrist extension    Wrist ulnar deviation    Wrist radial deviation    Wrist pronation    Wrist supination    (Blank rows = not tested)  HAND FUNCTION: Grip strength: Right: 48.9, 53.5, 53.3 (51.9 lbs average) lbs; Left: 24.2, 22.2, 22.7 (23.1 lbs average) lbs  COORDINATION: 9 Hole Peg test: Right: 20 sec; Left: 40 sec  Pt reported practicing picking up pills, sorting change, and playing connect-4 at home.  SENSATION: Pt reported tingling in L thumb which has since resolves. Pt questioned if symptoms might have been from resting hand splint at night. Pt reported no other symptoms of numbness/tingling.  EDEMA: None noted  MUSCLE TONE: RUE: Within functional limits and LUE: slight ataxia  COGNITION: Overall cognitive status: Within functional limits for tasks assessed  Pt reported some difficulty with word-finding.  Pt reported sometimes asking same question again "a few hours apart." Pt's mother reported not noticing this recently.  VISION: Subjective report: Pt reported initial changes to vision (unable to read, blurry vision close-up and far away) though "okay now."  Baseline vision: No visual deficits Visual history:  none noted  VISION ASSESSMENT: Pt accurately clock on wall and reported reading books on Kindle. Pt reported still using large print on phone since hospital.   Pt tracked easily to all 4 quadrants. Peripheral vision - WFL B sides, ?slightly decreased on L side though functional regardless  Patient has  difficulty with following activities due to following visual impairments: None noted  PERCEPTION: Not tested  PRAXIS: Not tested  OBSERVATIONS: Pt ambulated ind. Pt soft-spoken and appeared tired during session. Pt was pleasant and appeared motivated to complete LUE functional tasks at home based on pt report.  TREATMENT DATE:    TherAct OT initiated FM coordination HEP (handout provided, see pt instructions) - to improve L UE FM coordination, dexterity, proprioception. Pt returned demonstration of each exercise: Picking up objects and placing in a container with slot Stacking towers of coins  Finger-to-palm then palm-to-finger translation of small items Shuffling cards Turning cards over 1 at a time Hold deck of cards in palm of hand and push off 1 card at a time from the top of the deck using only thumb Tear piece of tissue and rolling into small balls with fingertips Rotating large 1-inch cubes Rolling pen between fingers and thumb, pen "twirl" (rotation), pen "shift" (translation)   Placing/removing Squigz at upright mirror - to improve functional reach with affected UE, to improve affected UE strengthening and coordination.  Shoulder squeezes - to improve upright standing/seated posture, to prevent compensatory movements, to improve strengthening and stability of affected UE.   Self-Care OT educated pt on energy conservation strategies and deep breathing strategies. Handout provided, see pt instructions. Pt acknowledged understanding and returned demo of deep breathing strategies.  Folding pillowcases (folding in half, folding in thirds, rolling) - to improve ind and efficiency for ADL takss, to improve coordination of affected UE.    PATIENT EDUCATION: Education details: see today's tx above Person educated: Patient and pt's parent Education method:  Explanation Education comprehension: verbalized understanding  HOME EXERCISE PROGRAM: 02/09/24 - Affected UE ROM and Yellow Theraputty. Access Code: [email or web address].   GOALS: Goals reviewed with patient? Yes  SHORT TERM GOALS: Target date: 03/09/24  Pt will ind demo understanding of HEP using visual handouts. Baseline: new to outpt OT Goal status: INITIAL  2.  Pt will ind recall at least 3 energy conservation strategies. Baseline: Activity tolerance: Pt reported "I'm doing more than I was doing in the hospital, but I definitely get tired easier and take more breaks."  Goal status: INITIAL  3.  Pt and/or family will return demo of personal e-stim unit use and electrode placement for affected UE.  Baseline: pt reported she has ordered a personal e-stim unit.  02/09/24 - Education and handout provided. Pt returned demo of using personal e-stim unit for wrist flex/ext of affected UE. Goal status: MET  4.  Pt will report improved ability to use LUE during overhead reaching tasks with minimal use of RUE to compensate, such as hair care tasks and washing hair. Baseline: pt reported difficulty with hair styling (e.g. putting hair in ponytail, curling hair) Goal status: INITIAL  LONG TERM GOALS: Target date: 04/06/24  Patient will report at least two-point increase in average PSFS score or at least three-point increase in a single activity score indicating functionally significant improvement given minimum detectable change using adaptive strategies and A/E PRN. Baseline: PSF: 3.0 total score (See above for individual activity scores)  Goal status: INITIAL  2.  Patient will demonstrate at least 30 lbs LUE grip strength as needed to open jars and other containers.  Baseline: Grip strength: Right: 48.9, 53.5, 53.3 (51.9 lbs average) lbs; Left: 24.2, 22.2, 22.7 (23.1 lbs average) lbs Goal status: INITIAL  3.  Patient will demo improved FM coordination as evidenced by completing nine-hole peg with use  of LUE in 30 seconds or less.  Baseline: 9 Hole Peg test: Right: 20 sec; Left: 40 sec Goal status: INITIAL  4.  Pt will verbalize understanding of A/E, adaptive strategies for functional tasks as needed for return to work considerations. Baseline: Work activities: Pt expressed concerns with FM  coordination and concerns about return to work considerations, such as coordination required for drawing up syringes from a vial for medication administration, completing IVs, typing, navigating phone.  Goal status: INITIAL  ASSESSMENT:  CLINICAL IMPRESSION: Pt tolerated tasks well. Pt demo'd mild frustration/disappointment during FM tasks d/t extra time required for FM tasks when using affected LUE. Pt benefited from verbal encouragement and reassurance along with changing FM tasks to another task to build confidence with LUE then returning to FM coordination tasks at later time. Pt would benefit from skilled OT services in the outpatient setting to work on impairments as noted below to help pt return to PLOF as able.     PERFORMANCE DEFICITS: in functional skills including ADLs, IADLs, coordination, dexterity, proprioception, sensation, tone, ROM, strength, pain, muscle spasms, flexibility, Fine motor control, Gross motor control, mobility, balance, endurance, and UE functional use, cognitive skills including attention and energy/drive, and psychosocial skills including environmental adaptation.   IMPAIRMENTS: are limiting patient from ADLs, IADLs, rest and sleep, work, leisure, and social participation.   CO-MORBIDITIES: may have co-morbidities  that affects occupational performance. Patient will benefit from skilled OT to address above impairments and improve overall function.  MODIFICATION OR ASSISTANCE TO COMPLETE EVALUATION: Min-Moderate modification of tasks or assist with assess necessary to complete an evaluation.  OT OCCUPATIONAL PROFILE AND HISTORY: Detailed assessment: Review of records and  additional review of physical, cognitive, psychosocial history related to current functional performance.  CLINICAL DECISION MAKING: Moderate - several treatment options, min-mod task modification necessary  REHAB POTENTIAL: Good  EVALUATION COMPLEXITY: Moderate    PLAN:  OT FREQUENCY: 2x/week  OT DURATION: 6 weeks (dates extended to allow for scheduling)  PLANNED INTERVENTIONS: 16109 OT Re-evaluation, 97535 self care/ADL training, 60454 therapeutic exercise, 97530 therapeutic activity, 97112 neuromuscular re-education, 97140 manual therapy, 97035 ultrasound, 97018 paraffin, 09811 fluidotherapy, 97010 moist heat, 97010 cryotherapy, 97760 Orthotic Initial, 97761 Prosthetic Initial, 97763 Orthotic/Prosthetic subsequent, passive range of motion, functional mobility training, visual/perceptual remediation/compensation, energy conservation, patient/family education, and DME and/or AE instructions  RECOMMENDED OTHER SERVICES: PT eval completed, SLP eval scheduled  CONSULTED AND AGREED WITH PLAN OF CARE: Patient and family member  PLAN FOR NEXT SESSION:  Functional use of LUE: e.g. clipping nails, putting hair in ponytail, grooming, navigating phone, typing, see PSFS items Placing/removing resistive clips Review ROM and theraputty HEP PRN E-stim unit handout for electrode placement - how did it go?  Oakley Bellman, OT 02/13/2024, 1:42 PM

## 2024-02-13 NOTE — Therapy (Signed)
 OUTPATIENT PHYSICAL THERAPY NEURO TREATMENT   Patient Name: Faith Jordan MRN: [id number] DOB:1996-07-30, 28 y.o., female Today's Date: 02/13/2024   PCP: Gabriel John, NP REFERRING PROVIDER: Genetta Kenning, MD  END OF SESSION:  PT End of Session - 02/13/24 0806     Visit Number 4    Number of Visits 13    Date for PT Re-Evaluation 03/20/24    Authorization Type Arlin Benes [employer]    PT Start Time 0802    PT Stop Time [phone]    PT Time Calculation (min) 47 min    Activity Tolerance Patient tolerated treatment well    Behavior During Therapy [facility] for tasks assessed/performed               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   Acute flank pain 06/02/2022   Angio-edema 04/27/2016   Angioedema    Aquagenic angio-edema-urticaria    Asthma    no problems /not used recently   Dysautonomia [facility])    Dysrhythmia    sinus tachycardia   Fibroid    right breast, adenoma   Headache(784.0)    History of COVID-19 11/21/2020   Hives 12/22/2015   Pneumonia    hx  6th grade   Sensation of fullness in both ears 02/02/2023   Snoring 02/11/2022   Urinary frequency 02/12/2019   Vaginal yeast infection 06/02/2022   Vasculitis (HCC)    Past Surgical History:  Procedure Laterality Date   ADENOIDECTOMY     BREAST LUMPECTOMY Right    BREAST LUMPECTOMY Right    MASS EXCISION Right 10/07/2014   Procedure: EXCISION OF RIGHT BREAST MASS;  Surgeon: Adalberto Hollow, MD;  Location: [facility] OR;  Service: General;  Laterality: Right;   TONSILLECTOMY AND ADENOIDECTOMY     TOOTH EXTRACTION     TRANSESOPHAGEAL ECHOCARDIOGRAM (CATH LAB) N/A 01/10/2024   Procedure: TRANSESOPHAGEAL ECHOCARDIOGRAM;  Surgeon: Hugh Madura, MD;  Location: [facility] INVASIVE CV LAB;  Service: Cardiovascular;  Laterality: N/A;   Patient Active Problem List   Diagnosis Date Noted   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)  02/10/2024   Coping style affecting medical condition 01/19/2024   History of CVA (cerebrovascular accident) 01/12/2024   Stroke of right basal ganglia (HCC) 01/07/2024   Non-restorative sleep 05/26/2022   Sleep paralysis, 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   Vivid dream 05/26/2022   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   Excessive daytime sleepiness 05/26/2022   Sleep disturbance 03/17/2022   Panic attacks 02/11/2022   Vitamin B12 deficiency 07/30/2021   Vitamin D  deficiency 07/30/2021   Family history of hypothyroidism 07/30/2021   GAD (generalized anxiety disorder) 03/18/2020   Fatigue 11/06/2019   Chronic back pain 07/06/2019   Preventative health care 04/27/2018   Migraines 04/25/2018   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   Anemia 01/04/2018   Headache disorder 12/29/2015   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   Sinus tachycardia 12/22/2015   Dysautonomia (HCC) 12/22/2015   Dysautonomia, familial (HCC) 04/22/2015   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   Neurocardiogenic syncope 04/06/2013   Intermittent palpitations 04/06/2013    ONSET DATE: 01/26/2024 (referral)   REFERRING DIAG:  I63.81 (ICD-10-CM) - Other cerebral infarction due to occlusion or stenosis of small artery    THERAPY DIAG:  Muscle weakness (generalized)  Hemiplegia and hemiparesis following cerebral infarction affecting left non-dominant side (HCC)  Other abnormalities of gait and mobility  Unsteadiness on  feet  Rationale for Evaluation and Treatment: Rehabilitation  SUBJECTIVE:                                                                                                                                                                                             SUBJECTIVE STATEMENT: Pt reports doing well. Heel wedge has helped her foot a lot. No falls. Going shopping for sandals today   Pt accompanied by:  Self and Mother  PERTINENT HISTORY: familiar hemiplegic migraine, angioedema  2017,dysautonomia; infarct right internal capsule/ R basal ganglia stroke  PAIN:  Are you having pain? No    PRECAUTIONS: Fall  RED FLAGS: None   WEIGHT BEARING RESTRICTIONS: No  FALLS: Has patient fallen in last 6 months? No  LIVING ENVIRONMENT: Lives with: lives with their family Lives in: House/apartment Stairs: Yes: External: 4 steps; bilateral but cannot reach both Has following equipment at home: shower chair  PLOF: Independent  PATIENT GOALS: "Being normal again. Being able to go back to work and be independent"   OBJECTIVE:  Note: Objective measures were completed at Evaluation unless otherwise noted.  DIAGNOSTIC FINDINGS: MRI of brain on 01/07/24  IMPRESSION: 1. 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 2. Susceptibility related calcifications in the globus pallidus bilaterally. This likely reflects the sequela of chronic microvascular ischemia.  COGNITION: Overall cognitive status: Within functional limits for tasks assessed   SENSATION: Pt denies numbness/tingling in L hemibody   MUSCLE TONE: Non-fatiguing clonus of LLE  POSTURE: No Significant postural limitations  LOWER EXTREMITY ROM:     Active  Right Eval Left Eval  Hip flexion    Hip extension    Hip abduction    Hip adduction    Hip internal rotation    Hip external rotation    Knee flexion    Knee extension    Ankle dorsiflexion    Ankle plantarflexion    Ankle inversion    Ankle eversion     (Blank rows = not tested)  LOWER EXTREMITY MMT:  Tested in seated position   MMT Right Eval Left Eval  Hip flexion 5 4+  Hip extension    Hip abduction 5 5  Hip adduction 5 5  Hip internal rotation    Hip external rotation    Knee flexion 5 5  Knee extension 5 4+  Ankle dorsiflexion 5 5  Ankle plantarflexion    Ankle inversion    Ankle eversion    (Blank rows = not tested)  BED MOBILITY:  Not tested  TRANSFERS: Sit to stand:  Complete Independence  Assistive  device utilized: None     Stand to sit: Complete Independence  Assistive device utilized: None      RAMP:  Not tested  CURB:  Not tested  GAIT: Gait pattern: step through pattern, decreased arm swing- Left, decreased step length- Right, decreased stance time- Left, decreased stride length, decreased hip/knee flexion- Left, lateral hip instability, and wide BOS Distance walked: Various clinic distances  Assistive device utilized: None Level of assistance: Modified independence Comments: Pt walks very guarded, as if she feels unstable. No instability noted other than single instance of L knee buckling which pt able to self-correct.    PATIENT SURVEYS:  ABC scale to be obtained                                                                                                                               TREATMENT:    NMR Elliptical level 1 for 8 minutes (4 fwd and 4 retro) for improved functional BLE strength, lateral weight shift and facilitation of L knee flexion. Pt w/improved L knee flexion/extension kinematics this date. Pt also able to perform full 8 minutes without rest breaks today. RPE of 8/10 following activity.   In // bars, alt fwd Bosu step ups (blue side) w/no shoes on, x8 reps per side, for improved single leg and ankle stability. No eversion of L ankle noted, but pt had difficulty maintaining PF vs DF, triggering her clonus. Added contralateral OH reach w/each step up for added UE coordination and core stability, x8 reps per side. Min cues to maintain ER of LUE when raising arm as she tends to abduct shoulder, IR and quickly extend elbow to perform.   Ther Act  Revised HEP provided by CIR PT (see bolded below) w/emphasis on stability and maintained DF of L ankle:  Downward dogs, 2x20s holds. Mod cues to press heels down into mat and find position that facilitates the most knee extension.  Half kneel holds, x3 reps per side. Pt challenged  w/maintaining balance on LLE  Adjusted bird dog activity to remove knee to elbow task, as this is too challenging for pt.  Educated pt and mom on [place] styles and importance of proper sizing. Encouraged a more strappy style to ensure they stay on pt's foot.    PATIENT EDUCATION: Education details: See ther act, updates to HEP Person educated: Patient and Mom Education method: Explanation, Demonstration, Verbal cues, and Handouts Education comprehension: verbalized understanding, returned demonstration, verbal cues required, and needs further education  HOME EXERCISE PROGRAM: Access Code: [email or web address] URL: [email or web address] Date: 02/13/2024 Prepared by: Burleigh Carp Dudley Cooley  Exercises - Half Kneeling Lift  - 1 x daily - 7 x weekly - 1 sets - 10 reps - Down Dog with Bent Knees and Heels on Mat  - 1 x daily - 7 x weekly - 2-3 reps - 15-30 seconds hold - Bird Dog  - 1 x daily - 7 x weekly - 3 sets -  10 reps  From CIR Therapist: Also instructed pt in 2 additional exercises but no printed versions available: - tall kneeling to half kneeling - sit to half kneeling and then return to sit. Perform to each side.   GOALS: Goals reviewed with patient? Yes  SHORT TERM GOALS: Target date: 02/28/2024   Pt will improve FGA to 28/30 for decreased fall risk   Baseline: 21/30 Goal status: INITIAL  2.  HiMat to be assessed at STG check and LTG updated  Baseline:  Goal status: INITIAL  3.  Pt will improve gait velocity to at least 3.4 ft/s for improved gait efficiency and return to PLOF  Baseline:  Goal status: INITIAL  4.  ABC scale to be assessed and LTG updated  Baseline:  Goal status: INITIAL  5.  Pt will demonstrate proper lifting technique of 25# weight from floor for proper body mechanics and safety at work Baseline:  Goal status: INITIAL  LONG TERM GOALS: Target date: 03/13/2024   Pt will improve gait velocity to at least 3.8 ft/s for improved gait efficiency  and independence  Baseline:  Goal status: INITIAL  2.  HiMat goal  Baseline:  Goal status: INITIAL  3.  ABC scale goal  Baseline:  Goal status: INITIAL  4.  Pt will be able to carry 10# object in BUEs up/down 4 steps for improved coordination and safety in work environment   Baseline: can only carry w/single UE  Goal status: INITIAL  5.  Pt will perform [place] get up w/5# weight for improved shoulder stability OH, core stability and high level balance  Baseline:  Goal status: INITIAL   ASSESSMENT:  CLINICAL IMPRESSION: Emphasis of skilled PT session on single leg stability, ankle stability/mobility and pt education. Pt reports the heel wedge in her shoe has helped her foot pain and is going to look for more supportive sandals today, as her flip flops hurt her foot. Pt continues to be limited by LLE clonus and decreased knee control on L side, but has improved significantly since implementation of Baclofen . Continue POC.     OBJECTIVE IMPAIRMENTS: Abnormal gait, decreased activity tolerance, decreased balance, decreased coordination, decreased endurance, decreased mobility, decreased strength, increased muscle spasms, impaired UE functional use, improper body mechanics, and pain  ACTIVITY LIMITATIONS: carrying, lifting, squatting, stairs, reach over head, hygiene/grooming, and locomotion level  PARTICIPATION LIMITATIONS: meal prep, cleaning, laundry, interpersonal relationship, driving, shopping, community activity, occupation, and yard work  PERSONAL FACTORS: 1 comorbidity: R internal capsule infarct/R basal ganglia stroke  are also affecting patient's functional outcome.   REHAB POTENTIAL: Excellent  CLINICAL DECISION MAKING: Evolving/moderate complexity  EVALUATION COMPLEXITY: Moderate  PLAN:  PT FREQUENCY:  2-3x/week (starting w/2x following eval)  PT DURATION: 6 weeks  PLANNED INTERVENTIONS: 40981- PT Re-evaluation, 97750- Physical Performance Testing,  97110-Therapeutic exercises, 97530- Therapeutic activity, [id number]- Neuromuscular re-education, 97535- Self Care, 19147- Manual therapy, [phone]- Gait training, [phone]- Canalith repositioning, [phone]- Aquatic Therapy, Patient/Family education, Balance training, Stair training, Taping, Dry Needling, Joint mobilization, Spinal mobilization, Vestibular training, and DME instructions  PLAN FOR NEXT SESSION: ABC scale and update goal. Review HEP from CIR and update prn. High level agility, elliptical, L quad eccentric strength, OH tasks    Cavan Bearden E Chakia Counts, PT, DPT 02/13/2024, 11:31 AM

## 2024-02-14 ENCOUNTER — Encounter: Payer: Self-pay | Admitting: Neurology

## 2024-02-15 ENCOUNTER — Telehealth: Payer: Self-pay | Admitting: Neurology

## 2024-02-15 ENCOUNTER — Ambulatory Visit: Admitting: Physical Therapy

## 2024-02-15 ENCOUNTER — Ambulatory Visit: Admitting: Occupational Therapy

## 2024-02-15 DIAGNOSIS — R2681 Unsteadiness on feet: Secondary | ICD-10-CM | POA: Diagnosis not present

## 2024-02-15 DIAGNOSIS — G43709 Chronic migraine without aura, not intractable, without status migrainosus: Secondary | ICD-10-CM

## 2024-02-15 DIAGNOSIS — M6281 Muscle weakness (generalized): Secondary | ICD-10-CM | POA: Diagnosis not present

## 2024-02-15 DIAGNOSIS — R278 Other lack of coordination: Secondary | ICD-10-CM

## 2024-02-15 DIAGNOSIS — I69354 Hemiplegia and hemiparesis following cerebral infarction affecting left non-dominant side: Secondary | ICD-10-CM

## 2024-02-15 DIAGNOSIS — R29818 Other symptoms and signs involving the nervous system: Secondary | ICD-10-CM

## 2024-02-15 DIAGNOSIS — R29898 Other symptoms and signs involving the musculoskeletal system: Secondary | ICD-10-CM | POA: Diagnosis not present

## 2024-02-15 DIAGNOSIS — R2689 Other abnormalities of gait and mobility: Secondary | ICD-10-CM

## 2024-02-15 NOTE — Therapy (Addendum)
 OUTPATIENT PHYSICAL THERAPY NEURO TREATMENT   Patient Name: Faith Jordan MRN: [id number] DOB:October 18, 1996, 28 y.o., female Today's Date: 02/15/2024   PCP: Gabriel John, NP REFERRING PROVIDER: Genetta Kenning, MD  END OF SESSION:  PT End of Session - 02/15/24 1403     Visit Number 5    Number of Visits 13    Date for PT Re-Evaluation 03/20/24    Authorization Type Arlin [employer]es [employer]    PT Start Time 1402    PT Stop Time 1447    PT Time Calculation (min) 45 min    Activity Tolerance Patient tolerated treatment well    Behavior During Therapy [facility] for tasks assessed/performed                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   Acute flank pain 06/02/2022   Angio-edema 04/27/2016   Angioedema    Aquagenic angio-edema-urticaria    Asthma    no problems /not used recently   Dysautonomia (HCC)    Dysrhythmia    sinus tachycardia   Fibroid    right breast, adenoma   Headache(784.0)    History of COVID-19 11/21/2020   Hives 12/22/2015   Pneumonia    hx  6th grade   Sensation of fullness in both ears 02/02/2023   Snoring 02/11/2022   Urinary frequency 02/12/2019   Vaginal yeast infection 06/02/2022   Vasculitis (HCC)    Past Surgical History:  Procedure Laterality Date   ADENOIDECTOMY     BREAST LUMPECTOMY Right    BREAST LUMPECTOMY Right    MASS EXCISION Right 10/07/2014   Procedure: EXCISION OF RIGHT BREAST MASS;  Surgeon: Adalberto Hollow, MD;  Location: [facility] OR;  Service: General;  Laterality: Right;   TONSILLECTOMY AND ADENOIDECTOMY     TOOTH EXTRACTION     TRANSESOPHAGEAL ECHOCARDIOGRAM (CATH LAB) N/A 01/10/2024   Procedure: TRANSESOPHAGEAL ECHOCARDIOGRAM;  Surgeon: Hugh Madura, MD;  Location: [facility] INVASIVE CV LAB;  Service: Cardiovascular;  Laterality: N/A;   Patient Active Problem List   Diagnosis Date Noted   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)  02/10/2024   Coping style affecting medical condition 01/19/2024   History of CVA (cerebrovascular accident) 01/12/2024   Stroke of right basal ganglia (HCC) 01/07/2024   Non-restorative sleep 05/26/2022   Sleep paralysis, 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   Vivid dream 05/26/2022   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   Excessive daytime sleepiness 05/26/2022   Sleep disturbance 03/17/2022   Panic attacks 02/11/2022   Vitamin B12 deficiency 07/30/2021   Vitamin D  deficiency 07/30/2021   Family history of hypothyroidism 07/30/2021   GAD (generalized anxiety disorder) 03/18/2020   Fatigue 11/06/2019   Chronic back pain 07/06/2019   Preventative health care 04/27/2018   Migraines 04/25/2018   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   Anemia 01/04/2018   Headache disorder 12/29/2015   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   Sinus tachycardia 12/22/2015   Dysautonomia (HCC) 12/22/2015   Dysautonomia, familial (HCC) 04/22/2015   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   Neurocardiogenic syncope 04/06/2013   Intermittent palpitations 04/06/2013    ONSET DATE: 01/26/2024 (referral)   REFERRING DIAG:  I63.81 (ICD-10-CM) - Other cerebral infarction due to occlusion or stenosis of small artery    THERAPY DIAG:  Muscle weakness (generalized)  Hemiplegia and hemiparesis following cerebral infarction affecting left non-dominant side (HCC)  Other abnormalities of gait and mobility  Unsteadiness  on feet  Rationale for Evaluation and Treatment: Rehabilitation  SUBJECTIVE:                                                                                                                                                                                             SUBJECTIVE STATEMENT: Pt reports doing okay. Emotional today, "I just want to be normal".   Pt accompanied by:  Self   PERTINENT HISTORY: familiar hemiplegic migraine, angioedema 2017,dysautonomia; infarct right internal capsule/  R basal ganglia stroke  PAIN:  Are you having pain? No    PRECAUTIONS: Fall  RED FLAGS: None   WEIGHT BEARING RESTRICTIONS: No  FALLS: Has patient fallen in last 6 months? No  LIVING ENVIRONMENT: Lives with: lives with their family Lives in: House/apartment Stairs: Yes: External: 4 steps; bilateral but cannot reach both Has following equipment at home: shower chair  PLOF: Independent  PATIENT GOALS: "Being normal again. Being able to go back to work and be independent"   OBJECTIVE:  Note: Objective measures were completed at Evaluation unless otherwise noted.  DIAGNOSTIC FINDINGS: MRI of brain on 01/07/24  IMPRESSION: 1. 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 2. Susceptibility related calcifications in the globus pallidus bilaterally. This likely reflects the sequela of chronic microvascular ischemia.  COGNITION: Overall cognitive status: Within functional limits for tasks assessed   SENSATION: Pt denies numbness/tingling in L hemibody   MUSCLE TONE: Non-fatiguing clonus of LLE  POSTURE: No Significant postural limitations  LOWER EXTREMITY ROM:     Active  Right Eval Left Eval  Hip flexion    Hip extension    Hip abduction    Hip adduction    Hip internal rotation    Hip external rotation    Knee flexion    Knee extension    Ankle dorsiflexion    Ankle plantarflexion    Ankle inversion    Ankle eversion     (Blank rows = not tested)  LOWER EXTREMITY MMT:  Tested in seated position   MMT Right Eval Left Eval  Hip flexion 5 4+  Hip extension    Hip abduction 5 5  Hip adduction 5 5  Hip internal rotation    Hip external rotation    Knee flexion 5 5  Knee extension 5 4+  Ankle dorsiflexion 5 5  Ankle plantarflexion    Ankle inversion    Ankle eversion    (Blank rows = not tested)  BED MOBILITY:  Not tested  TRANSFERS: Sit to stand: Complete Independence  Assistive device utilized: None  Stand to sit: Complete Independence  Assistive device utilized: None      RAMP:  Not tested  CURB:  Not tested  GAIT: Gait pattern: step through pattern, decreased arm swing- Left, decreased step length- Right, decreased stance time- Left, decreased stride length, decreased hip/knee flexion- Left, lateral hip instability, and wide BOS Distance walked: Various clinic distances  Assistive device utilized: None Level of assistance: Modified independence Comments: Pt walks very guarded, as if she feels unstable. No instability noted other than single instance of L knee buckling which pt able to self-correct.    PATIENT SURVEYS:  ABC scale to be obtained                                                                                                                               TREATMENT:    Self-care/home management  Provided therapeutic listening as pt voiced concerns with returning to work and desire to be normal. Provided encouragement as appropriate and reminded pt how far she has come. Informed pt that progress is not always linear and it is impossible to predict when pt may feel "normal" again, but informed pt to focus on functionality vs normality.  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  Encouraged pt to start using LUE more at home w/ADLs, such as putting her hair into a claw clip.   Ther Act  Had pt check therapist's temperature and check BP to practice managing monitors and fine motor skills w/LUE. Pt more challenged w/motor planning vs strength and is easily frustrated, but was able to perform tasks well.    PATIENT EDUCATION: Education details: See self-care  Person educated: Patient Education method: Explanation and Demonstration Education comprehension: verbalized understanding and needs further  education  HOME EXERCISE PROGRAM: Access Code: [email or web address] URL: [email or web address] Date: 02/13/2024 Prepared by: Burleigh Carp Steward Sames  Exercises - Half Kneeling Lift  - 1 x daily - 7 x weekly - 1 sets - 10 reps - Down Dog with Bent Knees and Heels on Mat  - 1 x daily - 7 x weekly - 2-3 reps - 15-30 seconds hold - Bird Dog  - 1 x daily - 7 x weekly - 3 sets - 10 reps  From CIR Therapist: Also instructed pt in 2 additional exercises but no printed versions available: - tall kneeling to half kneeling - sit to half kneeling and then return to sit. Perform to each side.   GOALS: Goals reviewed with patient? Yes  SHORT TERM GOALS: Target date: 02/28/2024   Pt will improve FGA to 28/30 for decreased fall risk   Baseline: 21/30 Goal status: INITIAL  2.  HiMat to be assessed at STG check and LTG updated  Baseline:  Goal status: INITIAL  3.  Pt will improve gait velocity to at least  3.4 ft/s for improved gait efficiency and return to PLOF  Baseline:  Goal status: INITIAL  4.  ABC scale to be assessed and LTG updated  Baseline:  Goal status: INITIAL  5.  Pt will demonstrate proper lifting technique of 25# weight from floor for proper body mechanics and safety at work Baseline:  Goal status: INITIAL  LONG TERM GOALS: Target date: 03/13/2024   Pt will improve gait velocity to at least 3.8 ft/s for improved gait efficiency and independence  Baseline:  Goal status: INITIAL  2.  HiMat goal  Baseline:  Goal status: INITIAL  3.  ABC scale goal  Baseline:  Goal status: INITIAL  4.  Pt will be able to carry 10# object in BUEs up/down 4 steps for improved coordination and safety in work environment   Baseline: can only carry w/single UE  Goal status: INITIAL  5.  Pt will perform [place] get up w/5# weight for improved shoulder stability OH, core stability and high level balance  Baseline:  Goal status: INITIAL   ASSESSMENT:  CLINICAL IMPRESSION: Session  limited as pt quite emotional and frustrated with progress in therapy. Pt reports she would like to be normal and return to work as a Engineer, civil (consulting) but is worried about her ability to safely return. Informed pt we will try to imitate high-stress environment of ER in therapy moving forward. Encouraged pt to implement use of LUE in ADLs at home to work on motor control as well. Continue POC.     OBJECTIVE IMPAIRMENTS: Abnormal gait, decreased activity tolerance, decreased balance, decreased coordination, decreased endurance, decreased mobility, decreased strength, increased muscle spasms, impaired UE functional use, improper body mechanics, and pain  ACTIVITY LIMITATIONS: carrying, lifting, squatting, stairs, reach over head, hygiene/grooming, and locomotion level  PARTICIPATION LIMITATIONS: meal prep, cleaning, laundry, interpersonal relationship, driving, shopping, community activity, occupation, and yard work  PERSONAL FACTORS: 1 comorbidity: R internal capsule infarct/R basal ganglia stroke  are also affecting patient's functional outcome.   REHAB POTENTIAL: Excellent  CLINICAL DECISION MAKING: Evolving/moderate complexity  EVALUATION COMPLEXITY: Moderate  PLAN:  PT FREQUENCY:  2-3x/week (starting w/2x following eval)  PT DURATION: 6 weeks  PLANNED INTERVENTIONS: 29562- PT Re-evaluation, 97750- Physical Performance Testing, 97110-Therapeutic exercises, 97530- Therapeutic activity, [id number]- Neuromuscular re-education, 97535- Self Care, 13086- Manual therapy, [phone]- Gait training, [phone]- Canalith repositioning, [phone]- Aquatic Therapy, Patient/Family education, Balance training, Stair training, Taping, Dry Needling, Joint mobilization, Spinal mobilization, Vestibular training, and DME instructions  PLAN FOR NEXT SESSION: ABC scale and update goal. Review HEP from CIR and update prn. High level agility, elliptical, L quad eccentric strength, OH tasks. Practice w/plastic needles Sonia Durand has these) in  oranges and pin the needle to targets?    Neisha Hinger E Schneur Crowson, PT, DPT 02/15/2024, 3:02 PM

## 2024-02-15 NOTE — Telephone Encounter (Signed)
 Submitted auth request via CMM, status is pending. Key: [id number]  Enrolled in [employer] Savings Program:

## 2024-02-15 NOTE — Telephone Encounter (Signed)
-----   Message from Glory Larsen sent at 02/14/2024  8:34 PM EDT ----- Regarding: please start botox for migraine protocol please start botox for migraine protocol. G43.709

## 2024-02-15 NOTE — Telephone Encounter (Signed)
 Chronic Migraine CPT 64615  Botox J0585 Units:200  G43.709 Chronic Migraine without aura, not intractable, without status migrainous

## 2024-02-15 NOTE — Patient Instructions (Signed)
 Typing:  Left-handed Words  wear were are bear bare care dear deer stare react dare  sad gear great rate  date red read fear free tea tear fast waste treat tease zest wax cast vase vast cat basket earn casket bee seat ate gas rare seed tax fax feed cart art tart taste ear here

## 2024-02-15 NOTE — Therapy (Signed)
 OUTPATIENT OCCUPATIONAL THERAPY NEURO TREATMENT  Patient Name: IBTISAM DUERST MRN: [id number] DOB:29-Jul-1996, 28 y.o., female Today's Date: 02/15/2024  PCP: Gabriel John, NP  REFERRING PROVIDER: Genetta Kenning, MD   END OF SESSION:  OT End of Session - 02/15/24 1448     Visit Number 4    Number of Visits 13   including eval   Date for OT Re-Evaluation 04/06/24    Authorization Type [employer] [facility]) 2025    OT Start Time 1449    OT Stop Time 1529    OT Time Calculation (min) 40 min    Activity Tolerance Patient tolerated treatment well    Behavior During Therapy [facility] for tasks assessed/performed            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   Acute flank pain 06/02/2022   Angio-edema 04/27/2016   Angioedema    Aquagenic angio-edema-urticaria    Asthma    no problems /not used recently   Dysautonomia (HCC)    Dysrhythmia    sinus tachycardia   Fibroid    right breast, adenoma   Headache(784.0)    History of COVID-19 11/21/2020   Hives 12/22/2015   Pneumonia    hx  6th grade   Sensation of fullness in both ears 02/02/2023   Snoring 02/11/2022   Urinary frequency 02/12/2019   Vaginal yeast infection 06/02/2022   Vasculitis (HCC)    Past Surgical History:  Procedure Laterality Date   ADENOIDECTOMY     BREAST LUMPECTOMY Right    BREAST LUMPECTOMY Right    MASS EXCISION Right 10/07/2014   Procedure: EXCISION OF RIGHT BREAST MASS;  Surgeon: Adalberto Hollow, MD;  Location: [facility] OR;  Service: General;  Laterality: Right;   TONSILLECTOMY AND ADENOIDECTOMY     TOOTH EXTRACTION     TRANSESOPHAGEAL ECHOCARDIOGRAM (CATH LAB) N/A 01/10/2024   Procedure: TRANSESOPHAGEAL ECHOCARDIOGRAM;  Surgeon: Hugh Madura, MD;  Location: [facility] INVASIVE CV LAB;  Service: Cardiovascular;  Laterality: N/A;   Patient Active Problem List   Diagnosis Date Noted   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) 02/10/2024   Coping style affecting medical condition 01/19/2024   History of CVA (cerebrovascular accident) 01/12/2024   Stroke of right basal ganglia (HCC) 01/07/2024   Non-restorative sleep 05/26/2022   Sleep paralysis, 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   Vivid dream 05/26/2022   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   Excessive daytime sleepiness 05/26/2022   Sleep disturbance 03/17/2022   Panic attacks 02/11/2022   Vitamin B12 deficiency 07/30/2021   Vitamin D  deficiency 07/30/2021   Family history of hypothyroidism 07/30/2021   GAD (generalized anxiety disorder) 03/18/2020   Fatigue 11/06/2019   Chronic back pain 07/06/2019   Preventative health care 04/27/2018   Migraines 04/25/2018   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   Anemia 01/04/2018   Headache disorder 12/29/2015   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   Sinus tachycardia 12/22/2015   Dysautonomia (HCC) 12/22/2015   Dysautonomia, familial (HCC) 04/22/2015   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   Neurocardiogenic syncope 04/06/2013   Intermittent palpitations 04/06/2013    ONSET DATE: 01/26/2024 (referral date)  REFERRING DIAG: I63.81 (ICD-10-CM) - Other cerebral infarction due to occlusion or stenosis of small artery   THERAPY DIAG:  Muscle weakness (generalized)  Other lack of coordination  Hemiplegia and hemiparesis following cerebral infarction affecting left non-dominant side (HCC)  Other symptoms and signs involving  the musculoskeletal system  Other symptoms and signs involving the nervous system  Rationale for Evaluation and Treatment: Rehabilitation  SUBJECTIVE:   SUBJECTIVE STATEMENT: Pronunciation: "Bree-Anne-ah"  She has not really noticed a lot of differences with her L hand recently. She has difficulty with L tip pinch.   Pt accompanied by: self   PERTINENT HISTORY: familiar hemiplegic migraine, angioedema 2017,dysautonomia; infarct right internal capsule/ R  basal ganglia stroke   MRI of brain on 01/07/24   IMPRESSION: 1. 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 2. Susceptibility related calcifications in the globus pallidus bilaterally. This likely reflects the sequela of chronic microvascular ischemia.  PRECAUTIONS: Fall, Per 02/10/24 MD Office Visit: Pt may gradually begin to return to driving.   WEIGHT BEARING RESTRICTIONS: No  PAIN:  Are you having pain? No  FALLS: Has patient fallen in last 6 months? Yes when CVA event occurred though none since then   LIVING ENVIRONMENT: Lives with: lives with their family Lives in: House/apartment Stairs: Yes: External: 4 steps; bilateral but cannot reach both Has following equipment at home: shower chair  PLOF: Independent, typically living ind, worked as Engineer, civil (consulting)  PATIENT GOALS: "I want to get my hand and arm back and be able to go to work."  OBJECTIVE:  Note: Objective measures were completed at Evaluation unless otherwise noted.  HAND DOMINANCE: Right  ADLs: Overall ADLs:  Transfers/ambulation related to ADLs: Eating: ind Grooming: difficulty with completing hair styling e.g. putting hair up in ponytail, difficulty with curling hair. Uses RUE to complete toothbrushing and brushing hair.  Difficulty with clipping fingernails. UB Dressing: ind, extra time. Buttons and zippers difficult.  LB Dressing: Ind, difficulty with pulling up pants and buttoning buttons on jeans, extra time for tying shoes Toileting: ind  Bathing: difficulty washing hair d/t unable to apply pressure while moving LUE, typically uses RUE to ensure thoroughness, shower bench is available PRN Tub Shower transfers: walk-in shower, pt reported SOB after showering or when walking a lot, sometimes requires rest breaks Equipment: Shower seat with back and Walk in shower  IADLs: Shopping: assistance from parents but pt participates, such as trying to reach for items on shelf  and pushing cart Light housekeeping: pt completes dishwashing, has not attempted laundry Meal Prep: pt reported cooking but task is tiring Community mobility: has attempted to drive 1x with parent  Handwriting:  No change per pt.   Work activities: Pt expressed concerns with FM coordination and concerns about return to work considerations, such as coordination required for drawing up syringes from a vial for medication administration, completing IVs, typing, navigating phone.   MOBILITY STATUS: Independent  POSTURE COMMENTS:  Ind sitting balance, sometimes forward shoulder posture  ACTIVITY TOLERANCE: Activity tolerance: Pt reported "I'm doing more than I was doing in the hospital, but I definitely get tired easier and take more breaks."  FUNCTIONAL OUTCOME MEASURES: PSF: 3.0    Total score = sum of the activity scores/number of activities Minimum detectable change (90%CI) for average score = 2 points Minimum detectable change (90%CI) for single activity score = 3 points   UPPER EXTREMITY ROM:    Active ROM Right eval Left eval  Shoulder flexion [facility]  Shoulder abduction  [facility]  Shoulder adduction    Shoulder extension    Shoulder internal rotation    Shoulder external rotation    Elbow flexion  WFL - slightly delayed movements  Elbow extension    Wrist flexion  Wrist extension    Wrist ulnar deviation    Wrist radial deviation    Wrist pronation    Wrist supination    (Blank rows = not tested)  OT palpated pt's L shoulder and noted "tightness" of muscles at L shoulder.   UPPER EXTREMITY MMT:     MMT Right eval Left eval  Shoulder flexion 5 3+   HAND FUNCTION: Grip strength: Right: 48.9, 53.5, 53.3 (51.9 lbs average) lbs; Left: 24.2, 22.2, 22.7 (23.1 lbs average) lbs  COORDINATION: 9 Hole Peg test: Right: 20 sec; Left: 40 sec  Pt reported practicing picking up pills, sorting change, and playing connect-4 at home.  SENSATION: Pt reported tingling in L  thumb which has since resolves. Pt questioned if symptoms might have been from resting hand splint at night. Pt reported no other symptoms of numbness/tingling.  EDEMA: None noted  MUSCLE TONE: RUE: Within functional limits and LUE: slight ataxia  COGNITION: Overall cognitive status: Within functional limits for tasks assessed  Pt reported some difficulty with word-finding.  Pt reported sometimes asking same question again "a few hours apart." Pt's mother reported not noticing this recently.  VISION: Subjective report: Pt reported initial changes to vision (unable to read, blurry vision close-up and far away) though "okay now."  Baseline vision: No visual deficits Visual history:  none noted  VISION ASSESSMENT: Pt accurately clock on wall and reported reading books on Kindle. Pt reported still using large print on phone since hospital.   Pt tracked easily to all 4 quadrants. Peripheral vision - WFL B sides, ?slightly decreased on L side though functional regardless  Patient has difficulty with following activities due to following visual impairments: None noted  PERCEPTION: Not tested  PRAXIS: Not tested  OBSERVATIONS: Pt ambulated ind. Pt soft-spoken and appeared tired during session. Pt was pleasant and appeared motivated to complete LUE functional tasks at home based on pt report.                                                                                                                             TREATMENT DATE:    TherAct Pt placed then removed ABC and 123 clips with LUE according to pattern on card for improved fine motor coordination, strength, visual discrimination, and cognition. Pt required increased time for proper completion.    OT initiated L handed typing words and use of [employer].es.   OT educated pt on table top play of Solitaire for BUE ROM, coordination, visual processing, scanning, and sequencing. Pt required minimal cues for proper play.    PATIENT  EDUCATION: Education details: see today's tx above Person educated: Patient Education method: Explanation, Demonstration, and Handouts Education comprehension: verbalized understanding  HOME EXERCISE PROGRAM: 02/09/24 - Affected UE ROM and Yellow Theraputty. Access Code: [email or web address]. 02/15/2024: L handed typing words  GOALS: Goals reviewed with patient? Yes  SHORT TERM GOALS: Target date: 03/09/24  Pt will ind demo understanding of HEP using visual  handouts. Baseline: new to outpt OT Goal status: INITIAL  2.  Pt will ind recall at least 3 energy conservation strategies. Baseline: Activity tolerance: Pt reported "I'm doing more than I was doing in the hospital, but I definitely get tired easier and take more breaks."  Goal status: INITIAL  3.  Pt and/or family will return demo of personal e-stim unit use and electrode placement for affected UE.  Baseline: pt reported she has ordered a personal e-stim unit.  02/09/24 - Education and handout provided. Pt returned demo of using personal e-stim unit for wrist flex/ext of affected UE. Goal status: MET  4.  Pt will report improved ability to use LUE during overhead reaching tasks with minimal use of RUE to compensate, such as hair care tasks and washing hair. Baseline: pt reported difficulty with hair styling (e.g. putting hair in ponytail, curling hair) Goal status: INITIAL  LONG TERM GOALS: Target date: 04/06/24  Patient will report at least two-point increase in average PSFS score or at least three-point increase in a single activity score indicating functionally significant improvement given minimum detectable change using adaptive strategies and A/E PRN. Baseline: PSF: 3.0 total score (See above for individual activity scores)  Goal status: INITIAL  2.  Patient will demonstrate at least 30 lbs LUE grip strength as needed to open jars and other containers.  Baseline: Grip strength: Right: 48.9, 53.5, 53.3 (51.9 lbs average) lbs; Left:  24.2, 22.2, 22.7 (23.1 lbs average) lbs Goal status: INITIAL  3.  Patient will demo improved FM coordination as evidenced by completing nine-hole peg with use of LUE in 30 seconds or less.  Baseline: 9 Hole Peg test: Right: 20 sec; Left: 40 sec Goal status: INITIAL  4.  Pt will verbalize understanding of A/E, adaptive strategies for functional tasks as needed for return to work considerations. Baseline: Work activities: Pt expressed concerns with FM coordination and concerns about return to work considerations, such as coordination required for drawing up syringes from a vial for medication administration, completing IVs, typing, navigating phone.  Goal status: INITIAL  ASSESSMENT:  CLINICAL IMPRESSION: Pt requiring encouragement though willing to participate in therapy visit as needed to progress towards goals.   PERFORMANCE DEFICITS: in functional skills including ADLs, IADLs, coordination, dexterity, proprioception, sensation, tone, ROM, strength, pain, muscle spasms, flexibility, Fine motor control, Gross motor control, mobility, balance, endurance, and UE functional use, cognitive skills including attention and energy/drive, and psychosocial skills including environmental adaptation.   IMPAIRMENTS: are limiting patient from ADLs, IADLs, rest and sleep, work, leisure, and social participation.   CO-MORBIDITIES: may have co-morbidities  that affects occupational performance. Patient will benefit from skilled OT to address above impairments and improve overall function.  REHAB POTENTIAL: Good  PLAN:  OT FREQUENCY: 2x/week  OT DURATION: 6 weeks (dates extended to allow for scheduling)  PLANNED INTERVENTIONS: 97168 OT Re-evaluation, 97535 self care/ADL training, 54098 therapeutic exercise, 97530 therapeutic activity, 97112 neuromuscular re-education, 97140 manual therapy, 97035 ultrasound, 97018 paraffin, 11914 fluidotherapy, 97010 moist heat, 97010 cryotherapy, 97760 Orthotic Initial,  97761 Prosthetic Initial, 97763 Orthotic/Prosthetic subsequent, passive range of motion, functional mobility training, visual/perceptual remediation/compensation, energy conservation, patient/family education, and DME and/or AE instructions  RECOMMENDED OTHER SERVICES: PT eval completed, SLP eval scheduled  CONSULTED AND AGREED WITH PLAN OF CARE: Patient and family member  PLAN FOR NEXT SESSION:  Functional use of LUE: e.g. clipping nails, putting hair in ponytail (digit extension/spread with putty?), grooming, navigating phone, typing, see PSFS items  Review ROM and  theraputty HEP PRN E-stim unit handout for electrode placement - how did it go?  Altamease Asters, OT 02/15/2024, 5:04 PM

## 2024-02-20 ENCOUNTER — Encounter: Payer: Self-pay | Admitting: Neurology

## 2024-02-20 ENCOUNTER — Other Ambulatory Visit: Payer: Self-pay

## 2024-02-20 DIAGNOSIS — Z8673 Personal history of transient ischemic attack (TIA), and cerebral infarction without residual deficits: Secondary | ICD-10-CM

## 2024-02-20 MED ORDER — ONABOTULINUMTOXINA 200 UNITS IJ SOLR
INTRAMUSCULAR | 3 refills | Status: AC
  Filled 2024-02-20: qty 1, fill #0
  Filled 2024-03-14: qty 1, 84d supply, fill #0
  Filled 2024-06-11: qty 1, 84d supply, fill #1
  Filled 2024-09-04: qty 1, 1d supply, fill #2
  Filled 2024-09-04: qty 1, 30d supply, fill #2
  Filled 2024-09-18: qty 1, 84d supply, fill #2

## 2024-02-20 NOTE — Addendum Note (Signed)
 Addended by: Burns Carwin on: 02/20/2024 08:59 AM   Modules accepted: Orders

## 2024-02-20 NOTE — Telephone Encounter (Signed)
Botox 200 unit Rx sent to [facility].

## 2024-02-20 NOTE — Telephone Encounter (Signed)
 Received approval, please send rx to [facility]. Auth#: [id number] (02/17/24-08/15/24)

## 2024-02-21 ENCOUNTER — Other Ambulatory Visit (HOSPITAL_COMMUNITY): Payer: Self-pay

## 2024-02-21 ENCOUNTER — Other Ambulatory Visit: Payer: Self-pay

## 2024-02-21 ENCOUNTER — Ambulatory Visit: Attending: Physical Medicine & Rehabilitation | Admitting: Physical Therapy

## 2024-02-21 ENCOUNTER — Ambulatory Visit: Admitting: Occupational Therapy

## 2024-02-21 DIAGNOSIS — R29898 Other symptoms and signs involving the musculoskeletal system: Secondary | ICD-10-CM

## 2024-02-21 DIAGNOSIS — R29818 Other symptoms and signs involving the nervous system: Secondary | ICD-10-CM | POA: Insufficient documentation

## 2024-02-21 DIAGNOSIS — I69354 Hemiplegia and hemiparesis following cerebral infarction affecting left non-dominant side: Secondary | ICD-10-CM

## 2024-02-21 DIAGNOSIS — R2681 Unsteadiness on feet: Secondary | ICD-10-CM | POA: Insufficient documentation

## 2024-02-21 DIAGNOSIS — R41841 Cognitive communication deficit: Secondary | ICD-10-CM | POA: Insufficient documentation

## 2024-02-21 DIAGNOSIS — R471 Dysarthria and anarthria: Secondary | ICD-10-CM | POA: Diagnosis present

## 2024-02-21 DIAGNOSIS — M6281 Muscle weakness (generalized): Secondary | ICD-10-CM | POA: Diagnosis present

## 2024-02-21 DIAGNOSIS — R2689 Other abnormalities of gait and mobility: Secondary | ICD-10-CM | POA: Diagnosis present

## 2024-02-21 DIAGNOSIS — R278 Other lack of coordination: Secondary | ICD-10-CM | POA: Insufficient documentation

## 2024-02-21 MED ORDER — ATORVASTATIN CALCIUM 40 MG PO TABS
40.0000 mg | ORAL_TABLET | Freq: Every day | ORAL | 3 refills | Status: DC
  Filled 2024-02-21: qty 90, 90d supply, fill #0

## 2024-02-21 NOTE — Therapy (Signed)
 OUTPATIENT OCCUPATIONAL THERAPY NEURO TREATMENT  Patient Name: Faith Jordan MRN: [id number] DOB:09-15-1996, 28 y.o., female Today's Date: 02/21/2024  PCP: Gabriel John, NP  REFERRING PROVIDER: Genetta Kenning, MD   END OF SESSION:  OT End of Session - 02/21/24 1844     Visit Number 5    Number of Visits 13   including eval   Date for OT Re-Evaluation 04/06/24    Authorization Type [employer] [facility]) 2025    OT Start Time [phone]    OT Stop Time 0928    OT Time Calculation (min) 39 min    Activity Tolerance Patient tolerated treatment well    Behavior During Therapy [facility] for tasks assessed/performed             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   Acute flank pain 06/02/2022   Angio-edema 04/27/2016   Angioedema    Aquagenic angio-edema-urticaria    Asthma    no problems /not used recently   Dysautonomia (HCC)    Dysrhythmia    sinus tachycardia   Fibroid    right breast, adenoma   Headache(784.0)    History of COVID-19 11/21/2020   Hives 12/22/2015   Pneumonia    hx  6th grade   Sensation of fullness in both ears 02/02/2023   Snoring 02/11/2022   Urinary frequency 02/12/2019   Vaginal yeast infection 06/02/2022   Vasculitis (HCC)    Past Surgical History:  Procedure Laterality Date   ADENOIDECTOMY     BREAST LUMPECTOMY Right    BREAST LUMPECTOMY Right    MASS EXCISION Right 10/07/2014   Procedure: EXCISION OF RIGHT BREAST MASS;  Surgeon: Adalberto Hollow, MD;  Location: [facility];  Service: General;  Laterality: Right;   TONSILLECTOMY AND ADENOIDECTOMY     TOOTH EXTRACTION     TRANSESOPHAGEAL ECHOCARDIOGRAM (CATH LAB) N/A 01/10/2024   Procedure: TRANSESOPHAGEAL ECHOCARDIOGRAM;  Surgeon: Hugh Madura, MD;  Location: [facility] INVASIVE CV LAB;  Service: Cardiovascular;  Laterality: N/A;   Patient Active Problem List   Diagnosis Date Noted   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) 02/10/2024   Coping style affecting medical condition 01/19/2024   History of CVA (cerebrovascular accident) 01/12/2024   Stroke of right basal ganglia (HCC) 01/07/2024   Non-restorative sleep 05/26/2022   Sleep paralysis, 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   Vivid dream 05/26/2022   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   Excessive daytime sleepiness 05/26/2022   Sleep disturbance 03/17/2022   Panic attacks 02/11/2022   Vitamin B12 deficiency 07/30/2021   Vitamin D  deficiency 07/30/2021   Family history of hypothyroidism 07/30/2021   GAD (generalized anxiety disorder) 03/18/2020   Fatigue 11/06/2019   Chronic back pain 07/06/2019   Preventative health care 04/27/2018   Migraines 04/25/2018   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   Anemia 01/04/2018   Headache disorder 12/29/2015   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   Sinus tachycardia 12/22/2015   Dysautonomia (HCC) 12/22/2015   Dysautonomia, familial (HCC) 04/22/2015   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   Neurocardiogenic syncope 04/06/2013   Intermittent palpitations 04/06/2013    ONSET DATE: 01/26/2024 (referral date)  REFERRING DIAG: I63.81 (ICD-10-CM) - Other cerebral infarction due to occlusion or stenosis of small artery   THERAPY DIAG:  Other symptoms and signs involving the musculoskeletal system  Other lack of coordination  Hemiplegia and hemiparesis following cerebral infarction affecting left non-dominant side (HCC)  Other symptoms and signs involving the nervous system  Muscle weakness (generalized)  Rationale for Evaluation and Treatment: Rehabilitation  SUBJECTIVE:   SUBJECTIVE STATEMENT: Pronunciation: "Bree-Anne-ah"  Pt reported legs feeling tired after PT session. Pt reported "I'm ok." Pt reported working on typing on [email or web address] per recommendation from previous OT session. Pt reported continued difficulty with washing and styling hair, e.g. holding brush with  pressure and reaching up overhead.  Pt accompanied by: self   PERTINENT HISTORY: familiar hemiplegic migraine, angioedema 2017,dysautonomia; infarct right internal capsule/ R basal ganglia stroke   MRI of brain on 01/07/24   IMPRESSION: 1. 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 2. Susceptibility related calcifications in the globus pallidus bilaterally. This likely reflects the sequela of chronic microvascular ischemia.  PRECAUTIONS: Fall, Per 02/10/24 MD Office Visit: Pt may gradually begin to return to driving.   WEIGHT BEARING RESTRICTIONS: No  PAIN:  Are you having pain? No  FALLS: Has patient fallen in last 6 months? Yes when CVA event occurred though none since then   LIVING ENVIRONMENT: Lives with: lives with their family Lives in: House/apartment Stairs: Yes: External: 4 steps; bilateral but cannot reach both Has following equipment at home: shower chair  PLOF: Independent, typically living ind, worked as Engineer, civil (consulting)  PATIENT GOALS: "I want to get my hand and arm back and be able to go to work."  OBJECTIVE:  Note: Objective measures were completed at Evaluation unless otherwise noted.  HAND DOMINANCE: Right  ADLs: Overall ADLs:  Transfers/ambulation related to ADLs: Eating: ind Grooming: difficulty with completing hair styling e.g. putting hair up in ponytail, difficulty with curling hair. Uses RUE to complete toothbrushing and brushing hair.  Difficulty with clipping fingernails. UB Dressing: ind, extra time. Buttons and zippers difficult.  LB Dressing: Ind, difficulty with pulling up pants and buttoning buttons on jeans, extra time for tying shoes Toileting: ind  Bathing: difficulty washing hair d/t unable to apply pressure while moving LUE, typically uses RUE to ensure thoroughness, shower bench is available PRN Tub Shower transfers: walk-in shower, pt reported SOB after showering or when walking a lot, sometimes  requires rest breaks Equipment: Shower seat with back and Walk in shower  IADLs: Shopping: assistance from parents but pt participates, such as trying to reach for items on shelf and pushing cart Light housekeeping: pt completes dishwashing, has not attempted laundry Meal Prep: pt reported cooking but task is tiring Community mobility: has attempted to drive 1x with parent  Handwriting:  No change per pt.   Work activities: Pt expressed concerns with FM coordination and concerns about return to work considerations, such as coordination required for drawing up syringes from a vial for medication administration, completing IVs, typing, navigating phone.   MOBILITY STATUS: Independent  POSTURE COMMENTS:  Ind sitting balance, sometimes forward shoulder posture  ACTIVITY TOLERANCE: Activity tolerance: Pt reported "I'm doing more than I was doing in the hospital, but I definitely get tired easier and take more breaks."  FUNCTIONAL OUTCOME MEASURES: PSF: 3.0    Total score = sum of the activity scores/number of activities Minimum detectable change (90%CI) for average score = 2 points Minimum detectable change (90%CI) for single activity score = 3 points   UPPER EXTREMITY ROM:    Active ROM Right eval Left eval  Shoulder flexion [facility]  Shoulder abduction  [facility]  Shoulder adduction    Shoulder extension    Shoulder internal rotation    Shoulder external  rotation    Elbow flexion  WFL - slightly delayed movements  Elbow extension    Wrist flexion    Wrist extension    Wrist ulnar deviation    Wrist radial deviation    Wrist pronation    Wrist supination    (Blank rows = not tested)  OT palpated pt's L shoulder and noted "tightness" of muscles at L shoulder.   UPPER EXTREMITY MMT:     MMT Right eval Left eval  Shoulder flexion 5 3+   HAND FUNCTION: Grip strength: Right: 48.9, 53.5, 53.3 (51.9 lbs average) lbs; Left: 24.2, 22.2, 22.7 (23.1 lbs average)  lbs  COORDINATION: 9 Hole Peg test: Right: 20 sec; Left: 40 sec  Pt reported practicing picking up pills, sorting change, and playing connect-4 at home.  SENSATION: Pt reported tingling in L thumb which has since resolves. Pt questioned if symptoms might have been from resting hand splint at night. Pt reported no other symptoms of numbness/tingling.  EDEMA: None noted  MUSCLE TONE: RUE: Within functional limits and LUE: slight ataxia  COGNITION: Overall cognitive status: Within functional limits for tasks assessed  Pt reported some difficulty with word-finding.  Pt reported sometimes asking same question again "a few hours apart." Pt's mother reported not noticing this recently.  VISION: Subjective report: Pt reported initial changes to vision (unable to read, blurry vision close-up and far away) though "okay now."  Baseline vision: No visual deficits Visual history:  none noted  VISION ASSESSMENT: Pt accurately clock on wall and reported reading books on Kindle. Pt reported still using large print on phone since hospital.   Pt tracked easily to all 4 quadrants. Peripheral vision - WFL B sides, ?slightly decreased on L side though functional regardless  Patient has difficulty with following activities due to following visual impairments: None noted  PERCEPTION: Not tested  PRAXIS: Not tested  OBSERVATIONS: Pt ambulated ind. Pt soft-spoken and appeared tired during session. Pt was pleasant and appeared motivated to complete LUE functional tasks at home based on pt report.                                                                                                                             TREATMENT DATE:    TherAct HEP update: Affected UE Digit extension - trialed several different types of digit ext HEP options (theraputty on table, theraputty circle with hand supinated and elevated, theraputty circle with hand pronated, using rubber bands, using hair ties) -  to  improve affected UE coordination and dexterity and strengthening. Pt reported preference for using rubber bands to practice digit ext.  Access Code: [email or web address] - Finger Extension with Putty and/or rubber bands  - 2 x daily - 1 sets - 5 reps.   Digit extension with Digit-Extend - 10 reps, 2 sets - o improve affected UE coordination and dexterity and strengthening. Task graded down from 2 rubber bands to one rubber band.   OT  educated pt on option to mirror motions with unaffected UE - e.g. during functional tasks, during digit ext, during home e-stim application. Pt verbalized understanding.  Neuro Re-Ed HEP update: Functional reaching (various, see below) - to improve functional reach as needed for grooming, hair styling, and washing hair. Pt returned demo though reported fatigue of affected UE:  -Handwritten/Verbal instructions: 1 set in front of mirror, 1 set without mirror: Reaching overhead to touch top of head with affected hand, reaching overhead to touch back of head with affected hand, reaching arm forward and touching nose with affected UE - 10 reps, 2 sets, 2x per day.  Proprioception: locating items in rice bin with eyes occluded - for proprioception, stereognosis, FM coordination of affected UE.  OT educated pt and family on fatigue management, taking breaks PRN, energy conservation, proprioception, use of visual feedback (e.g. upright mirror) to compensate for decreased proprioception of affected UE. Pt and family acknowledged understanding.  PATIENT EDUCATION: Education details: see today's tx above Person educated: Patient Education method: Explanation, Demonstration, and Handouts Education comprehension: verbalized understanding  HOME EXERCISE PROGRAM: 02/09/24 - Affected UE ROM and Yellow Theraputty. Access Code: [email or web address]. 02/15/2024: L handed typing words 02/21/24 - Digit ext with theraputty or rubber band (access code: [email or web address]). Functional reach: Handwritten/Verbal  instructions: 1 set in front of mirror, 1 set without mirror: Reaching overhead to touch top of head with affected hand, reaching overhead to touch back of head with affected hand, reaching arm forward and touching nose with affected UE - 10 reps, 2 sets, 2x per day.  GOALS: Goals reviewed with patient? Yes  SHORT TERM GOALS: Target date: 03/09/24  Pt will ind demo understanding of HEP using visual handouts. Baseline: new to outpt OT Goal status: in progress  2.  Pt will ind recall at least 3 energy conservation strategies. Baseline: Activity tolerance: Pt reported "I'm doing more than I was doing in the hospital, but I definitely get tired easier and take more breaks."  Goal status: in progress  3.  Pt and/or family will return demo of personal e-stim unit use and electrode placement for affected UE.  Baseline: pt reported she has ordered a personal e-stim unit.  02/09/24 - Education and handout provided. Pt returned demo of using personal e-stim unit for wrist flex/ext of affected UE. Goal status: MET  4.  Pt will report improved ability to use LUE during overhead reaching tasks with minimal use of RUE to compensate, such as hair care tasks and washing hair. Baseline: pt reported difficulty with hair styling (e.g. putting hair in ponytail, curling hair) Goal status: in progress  LONG TERM GOALS: Target date: 04/06/24  Patient will report at least two-point increase in average PSFS score or at least three-point increase in a single activity score indicating functionally significant improvement given minimum detectable change using adaptive strategies and A/E PRN. Baseline: PSF: 3.0 total score (See above for individual activity scores)  Goal status: in progress  2.  Patient will demonstrate at least 30 lbs LUE grip strength as needed to open jars and other containers.  Baseline: Grip strength: Right: 48.9, 53.5, 53.3 (51.9 lbs average) lbs; Left: 24.2, 22.2, 22.7 (23.1 lbs average)  lbs Goal status: in progress  3.  Patient will demo improved FM coordination as evidenced by completing nine-hole peg with use of LUE in 30 seconds or less.  Baseline: 9 Hole Peg test: Right: 20 sec; Left: 40 sec Goal status: in progress  4.  Pt will verbalize understanding  of A/E, adaptive strategies for functional tasks as needed for return to work considerations. Baseline: Work activities: Pt expressed concerns with FM coordination and concerns about return to work considerations, such as coordination required for drawing up syringes from a vial for medication administration, completing IVs, typing, navigating phone.  Goal status: in progress  ASSESSMENT:  CLINICAL IMPRESSION: Pt tolerated tasks fairly well though reported and demo'd fatigue of affected UE during functional tasks. Pt requiring encouragement though willing to participate in therapy visit as needed to progress towards goals.   PERFORMANCE DEFICITS: in functional skills including ADLs, IADLs, coordination, dexterity, proprioception, sensation, tone, ROM, strength, pain, muscle spasms, flexibility, Fine motor control, Gross motor control, mobility, balance, endurance, and UE functional use, cognitive skills including attention and energy/drive, and psychosocial skills including environmental adaptation.   IMPAIRMENTS: are limiting patient from ADLs, IADLs, rest and sleep, work, leisure, and social participation.   CO-MORBIDITIES: may have co-morbidities  that affects occupational performance. Patient will benefit from skilled OT to address above impairments and improve overall function.  REHAB POTENTIAL: Good  PLAN:  OT FREQUENCY: 2x/week  OT DURATION: 6 weeks (dates extended to allow for scheduling)  PLANNED INTERVENTIONS: 97168 OT Re-evaluation, 97535 self care/ADL training, 40981 therapeutic exercise, 97530 therapeutic activity, 97112 neuromuscular re-education, 97140 manual therapy, 97035 ultrasound, 97018 paraffin,  19147 fluidotherapy, 97010 moist heat, 97010 cryotherapy, 97760 Orthotic Initial, 97761 Prosthetic Initial, 97763 Orthotic/Prosthetic subsequent, passive range of motion, functional mobility training, visual/perceptual remediation/compensation, energy conservation, patient/family education, and DME and/or AE instructions  RECOMMENDED OTHER SERVICES: PT eval completed, SLP eval scheduled  CONSULTED AND AGREED WITH PLAN OF CARE: Patient and family member  PLAN FOR NEXT SESSION:  Functional use of LUE: e.g. clipping nails, putting hair in ponytail, grooming, navigating phone, typing, see PSFS items  Review ROM and theraputty HEP PRN FM coordination and functional reaching activities - [employer] E-stim unit handout for electrode placement - how did it go?  Oakley Bellman, OT 02/21/2024, 6:53 PM

## 2024-02-21 NOTE — Therapy (Signed)
 OUTPATIENT PHYSICAL THERAPY NEURO TREATMENT   Patient Name: Faith Jordan MRN: [id number] DOB:19-Dec-1995, 28 y.o., female Today's Date: 02/21/2024   PCP: Gabriel John, NP REFERRING PROVIDER: Genetta Kenning, MD  END OF SESSION:  PT End of Session - 02/21/24 0801     Visit Number 6    Number of Visits 13    Date for PT Re-Evaluation 03/20/24    Authorization Type [employer]    PT Start Time 0800    PT Stop Time [phone]    PT Time Calculation (min) 43 min    Equipment Utilized During Treatment Gait belt    Activity Tolerance Patient tolerated treatment well    Behavior During Therapy [facility] for tasks assessed/performed                 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   Acute flank pain 06/02/2022   Angio-edema 04/27/2016   Angioedema    Aquagenic angio-edema-urticaria    Asthma    no problems /not used recently   Dysautonomia (HCC)    Dysrhythmia    sinus tachycardia   Fibroid    right breast, adenoma   Headache(784.0)    History of COVID-19 11/21/2020   Hives 12/22/2015   Pneumonia    hx  6th grade   Sensation of fullness in both ears 02/02/2023   Snoring 02/11/2022   Urinary frequency 02/12/2019   Vaginal yeast infection 06/02/2022   Vasculitis (HCC)    Past Surgical History:  Procedure Laterality Date   ADENOIDECTOMY     BREAST LUMPECTOMY Right    BREAST LUMPECTOMY Right    MASS EXCISION Right 10/07/2014   Procedure: EXCISION OF RIGHT BREAST MASS;  Surgeon: Adalberto Hollow, MD;  Location: [facility] OR;  Service: General;  Laterality: Right;   TONSILLECTOMY AND ADENOIDECTOMY     TOOTH EXTRACTION     TRANSESOPHAGEAL ECHOCARDIOGRAM (CATH LAB) N/A 01/10/2024   Procedure: TRANSESOPHAGEAL ECHOCARDIOGRAM;  Surgeon: Hugh Madura, MD;  Location: [facility] INVASIVE CV LAB;  Service: Cardiovascular;  Laterality: N/A;   Patient Active Problem List   Diagnosis Date Noted   Spastic hemiparesis of left  nondominant side due to acute cerebral infarction (HCC) 02/10/2024   Coping style affecting medical condition 01/19/2024   History of CVA (cerebrovascular accident) 01/12/2024   Stroke of right basal ganglia (HCC) 01/07/2024   Non-restorative sleep 05/26/2022   Sleep paralysis, 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   Vivid dream 05/26/2022   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   Excessive daytime sleepiness 05/26/2022   Sleep disturbance 03/17/2022   Panic attacks 02/11/2022   Vitamin B12 deficiency 07/30/2021   Vitamin D  deficiency 07/30/2021   Family history of hypothyroidism 07/30/2021   GAD (generalized anxiety disorder) 03/18/2020   Fatigue 11/06/2019   Chronic back pain 07/06/2019   Preventative health care 04/27/2018   Migraines 04/25/2018   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   Anemia 01/04/2018   Headache disorder 12/29/2015   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   Sinus tachycardia 12/22/2015   Dysautonomia (HCC) 12/22/2015   Dysautonomia, familial (HCC) 04/22/2015   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   Neurocardiogenic syncope 04/06/2013   Intermittent palpitations 04/06/2013    ONSET DATE: 01/26/2024 (referral)   REFERRING DIAG:  I63.81 (ICD-10-CM) - Other cerebral infarction due to occlusion or stenosis of small artery    THERAPY DIAG:  Muscle weakness (generalized)  Hemiplegia and hemiparesis following cerebral infarction affecting left non-dominant side (  HCC)  Other abnormalities of gait and mobility  Unsteadiness on feet  Other lack of coordination  Other symptoms and signs involving the musculoskeletal system  Other symptoms and signs involving the nervous system  Rationale for Evaluation and Treatment: Rehabilitation  SUBJECTIVE:                                                                                                                                                                                             SUBJECTIVE STATEMENT: Pt  denies any acute changes since last visit, no falls. Pt denies any pain today.  Pt reports that she would squat a lot at work and feels like if she tries to do that now she would get stuck.  Pt asking about getting more wedges to put in multiple pairs of tennis shoes.  Pt accompanied by:  Self, Mom   PERTINENT HISTORY: familiar hemiplegic migraine, angioedema 2017,dysautonomia; infarct right internal capsule/ R basal ganglia stroke  PAIN:  Are you having pain? No    PRECAUTIONS: Fall  RED FLAGS: None   WEIGHT BEARING RESTRICTIONS: No  FALLS: Has patient fallen in last 6 months? No  LIVING ENVIRONMENT: Lives with: lives with their family Lives in: House/apartment Stairs: Yes: External: 4 steps; bilateral but cannot reach both Has following equipment at home: shower chair  PLOF: Independent  PATIENT GOALS: "Being normal again. Being able to go back to work and be independent"   OBJECTIVE:  Note: Objective measures were completed at Evaluation unless otherwise noted.  DIAGNOSTIC FINDINGS: MRI of brain on 01/07/24  IMPRESSION: 1. 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 2. Susceptibility related calcifications in the globus pallidus bilaterally. This likely reflects the sequela of chronic microvascular ischemia.  COGNITION: Overall cognitive status: Within functional limits for tasks assessed   SENSATION: Pt denies numbness/tingling in L hemibody   MUSCLE TONE: Non-fatiguing clonus of LLE  POSTURE: No Significant postural limitations  LOWER EXTREMITY ROM:     Active  Right Eval Left Eval  Hip flexion    Hip extension    Hip abduction    Hip adduction    Hip internal rotation    Hip external rotation    Knee flexion    Knee extension    Ankle dorsiflexion    Ankle plantarflexion    Ankle inversion    Ankle eversion     (Blank rows = not tested)  LOWER EXTREMITY MMT:  Tested in seated position   MMT  Right Eval Left Eval  Hip flexion 5 4+  Hip extension    Hip abduction 5  5  Hip adduction 5 5  Hip internal rotation    Hip external rotation    Knee flexion 5 5  Knee extension 5 4+  Ankle dorsiflexion 5 5  Ankle plantarflexion    Ankle inversion    Ankle eversion    (Blank rows = not tested)  BED MOBILITY:  Not tested  TRANSFERS: Sit to stand: Complete Independence  Assistive device utilized: None     Stand to sit: Complete Independence  Assistive device utilized: None      RAMP:  Not tested  CURB:  Not tested  GAIT: Gait pattern: step through pattern, decreased arm swing- Left, decreased step length- Right, decreased stance time- Left, decreased stride length, decreased hip/knee flexion- Left, lateral hip instability, and wide BOS Distance walked: Various clinic distances  Assistive device utilized: None Level of assistance: Modified independence Comments: Pt walks very guarded, as if she feels unstable. No instability noted other than single instance of L knee buckling which pt able to self-correct.    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, civil (consulting) in the ED: Circuit of functional tasks x 3 reps: Large tumbleform roll on mat table to each side of the bed While on airex: Syringe water transfer cup to cup x 3 reps Needle in orange x 5 stabs to marked targets Squat to retrieve 15# surge, gait with surge x 115 ft, squat to place surge on ground Added in dual cognitive tasks for last 2 reps (counting down by 3's starting at 70, naming foods in ABC order) Dead lift 10# KB x 10 reps, minimal difficulty Transition to 10# KB deep squat x 7 reps Difficulty coming back up from a deep squat without UE  support for balance Tried to transition to 6" step KB squats, LE fatigued and unable to perform safely Suitcase carries 2 x 115 ft each (CW, CCW around gym) 10# KB LUE 15# KB RUE Attempt to try squats again at end of session, pt unable to perform due to onset of LLE fatigue and limb shaking   PATIENT EDUCATION: Education details: continue HEP, can work on squats at home with countertop support and a chair behind her Person educated: Patient and Parent Education method: Medical illustrator Education comprehension: verbalized understanding and needs further education  HOME EXERCISE PROGRAM: Access Code: [email or web address] URL: [email or web address] Date: 02/13/2024 Prepared by: Burleigh Carp Plaster  Exercises - Half Kneeling Lift  - 1 x daily - 7 x weekly - 1 sets - 10 reps - Down Dog with Bent Knees and Heels on Mat  - 1 x daily - 7 x weekly - 2-3  reps - 15-30 seconds hold - Bird Dog  - 1 x daily - 7 x weekly - 3 sets - 10 reps  From CIR Therapist: Also instructed pt in 2 additional exercises but no printed versions available: - tall kneeling to half kneeling - sit to half kneeling and then return to sit. Perform to each side.   Verbally added 02/21/24: -squats at countertop with chair behind you  GOALS: Goals reviewed with patient? Yes  SHORT TERM GOALS: Target date: 02/28/2024   Pt will improve FGA to 28/30 for decreased fall risk   Baseline: 21/30 Goal status: INITIAL  2.  HiMat to be assessed at STG check and LTG updated  Baseline:  Goal status: INITIAL  3.  Pt will improve gait velocity to at least 3.4 ft/s for improved gait efficiency and return to PLOF  Baseline:  Goal status: INITIAL  4.  ABC scale to be assessed and LTG updated  Baseline:  Goal status: INITIAL  5.  Pt will demonstrate proper lifting technique of 25# weight from floor for proper body mechanics and safety at work Baseline:  Goal status: INITIAL  LONG TERM GOALS: Target date:  03/13/2024   Pt will improve gait velocity to at least 3.8 ft/s for improved gait efficiency and independence  Baseline:  Goal status: INITIAL  2.  HiMat goal  Baseline:  Goal status: INITIAL  3.  ABC scale goal  Baseline:  Goal status: INITIAL  4.  Pt will be able to carry 10# object in BUEs up/down 4 steps for improved coordination and safety in work environment   Baseline: can only carry w/single UE  Goal status: INITIAL  5.  Pt will perform [place] get up w/5# weight for improved shoulder stability OH, core stability and high level balance  Baseline:  Goal status: INITIAL   ASSESSMENT:  CLINICAL IMPRESSION: Emphasis of skilled PT session on working on circuits of functional tasks to simulate patient's work environment. Added in cognitive dual-tasking during last 2 repetitions of circuit, pt needing increased time to complete task and has difficulty multitasking without verbal cueing. Pt also continues to struggle to return to standing from a deep squat, a position she will get into frequently at work. She also continues to exhibit impaired endurance and her LE fatigue quickly when challenged. Pt continues to benefit from skilled PT services to work towards return to [facility] as safe and able.Continue POC.    OBJECTIVE IMPAIRMENTS: Abnormal gait, decreased activity tolerance, decreased balance, decreased coordination, decreased endurance, decreased mobility, decreased strength, increased muscle spasms, impaired UE functional use, improper body mechanics, and pain  ACTIVITY LIMITATIONS: carrying, lifting, squatting, stairs, reach over head, hygiene/grooming, and locomotion level  PARTICIPATION LIMITATIONS: meal prep, cleaning, laundry, interpersonal relationship, driving, shopping, community activity, occupation, and yard work  PERSONAL FACTORS: 1 comorbidity: R internal capsule infarct/R basal ganglia stroke  are also affecting patient's functional outcome.   REHAB POTENTIAL:  Excellent  CLINICAL DECISION MAKING: Evolving/moderate complexity  EVALUATION COMPLEXITY: Moderate  PLAN:  PT FREQUENCY:  2-3x/week (starting w/2x following eval)  PT DURATION: 6 weeks  PLANNED INTERVENTIONS: 25956- PT Re-evaluation, 97750- Physical Performance Testing, 97110-Therapeutic exercises, 97530- Therapeutic activity, [id number]- Neuromuscular re-education, 97535- Self Care, [id number]- Manual therapy, [phone]- Gait training, [phone]- Canalith repositioning, [phone]- Aquatic Therapy, Patient/Family education, Balance training, Stair training, Taping, Dry Needling, Joint mobilization, Spinal mobilization, Vestibular training, and DME instructions  PLAN FOR NEXT SESSION: ABC scale and update goal. Review HEP from CIR and update prn. High level agility,  elliptical, L quad eccentric strength, OH tasks. Practice w/plastic needles Sonia Durand has these) in oranges and pin the needle to targets? Cognitive dual tasking, needs to work on deep squats and return to standing from squats, ask Burleigh Carp about wedge - pt wanting to order more wedges for different pairs of shoes   Lorita Rosa, PT Lorita Rosa, PT, DPT, CSRS  02/21/2024, 8:43 AM

## 2024-02-23 ENCOUNTER — Ambulatory Visit: Admitting: Physical Therapy

## 2024-02-23 ENCOUNTER — Encounter: Payer: Self-pay | Admitting: Adult Health

## 2024-02-23 ENCOUNTER — Ambulatory Visit: Admitting: Occupational Therapy

## 2024-02-23 ENCOUNTER — Telehealth (INDEPENDENT_AMBULATORY_CARE_PROVIDER_SITE_OTHER): Payer: Self-pay | Admitting: Adult Health

## 2024-02-23 ENCOUNTER — Other Ambulatory Visit: Payer: Self-pay

## 2024-02-23 DIAGNOSIS — M6281 Muscle weakness (generalized): Secondary | ICD-10-CM

## 2024-02-23 DIAGNOSIS — F410 Panic disorder [episodic paroxysmal anxiety] without agoraphobia: Secondary | ICD-10-CM

## 2024-02-23 DIAGNOSIS — I69354 Hemiplegia and hemiparesis following cerebral infarction affecting left non-dominant side: Secondary | ICD-10-CM

## 2024-02-23 DIAGNOSIS — F429 Obsessive-compulsive disorder, unspecified: Secondary | ICD-10-CM

## 2024-02-23 DIAGNOSIS — R2681 Unsteadiness on feet: Secondary | ICD-10-CM

## 2024-02-23 DIAGNOSIS — G479 Sleep disorder, unspecified: Secondary | ICD-10-CM

## 2024-02-23 DIAGNOSIS — F330 Major depressive disorder, recurrent, mild: Secondary | ICD-10-CM

## 2024-02-23 DIAGNOSIS — R278 Other lack of coordination: Secondary | ICD-10-CM

## 2024-02-23 DIAGNOSIS — R2689 Other abnormalities of gait and mobility: Secondary | ICD-10-CM

## 2024-02-23 DIAGNOSIS — R29818 Other symptoms and signs involving the nervous system: Secondary | ICD-10-CM

## 2024-02-23 DIAGNOSIS — G4700 Insomnia, unspecified: Secondary | ICD-10-CM | POA: Diagnosis not present

## 2024-02-23 DIAGNOSIS — F411 Generalized anxiety disorder: Secondary | ICD-10-CM

## 2024-02-23 DIAGNOSIS — R29898 Other symptoms and signs involving the musculoskeletal system: Secondary | ICD-10-CM

## 2024-02-23 MED ORDER — FLUOXETINE HCL 40 MG PO CAPS
40.0000 mg | ORAL_CAPSULE | Freq: Every day | ORAL | 0 refills | Status: DC
  Filled 2024-02-23: qty 90, 90d supply, fill #0

## 2024-02-23 MED ORDER — TRAZODONE HCL 100 MG PO TABS
100.0000 mg | ORAL_TABLET | Freq: Every day | ORAL | 0 refills | Status: DC
  Filled 2024-02-23: qty 90, 90d supply, fill #0

## 2024-02-23 NOTE — Therapy (Signed)
 OUTPATIENT OCCUPATIONAL THERAPY NEURO TREATMENT  Patient Name: SEVYNN MCCONNAUGHEY MRN: [id number] DOB:Jun 06, 1996, 28 y.o., female Today's Date: 02/23/2024  PCP: Gabriel John, NP  REFERRING PROVIDER: Genetta Kenning, MD   END OF SESSION:  OT End of Session - 02/23/24 1518     Visit Number 6    Number of Visits 13   including eval   Date for OT Re-Evaluation 04/06/24    Authorization Type [employer] ([place]) 2025    OT Start Time 1019    OT Stop Time 1057    OT Time Calculation (min) 38 min    Activity Tolerance Patient tolerated treatment well    Behavior During Therapy [facility] for tasks assessed/performed              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   Acute flank pain 06/02/2022   Angio-edema 04/27/2016   Angioedema    Aquagenic angio-edema-urticaria    Asthma    no problems /not used recently   Dysautonomia (HCC)    Dysrhythmia    sinus tachycardia   Fibroid    right breast, adenoma   Headache(784.0)    History of COVID-19 11/21/2020   Hives 12/22/2015   Pneumonia    hx  6th grade   Sensation of fullness in both ears 02/02/2023   Snoring 02/11/2022   Urinary frequency 02/12/2019   Vaginal yeast infection 06/02/2022   Vasculitis (HCC)    Past Surgical History:  Procedure Laterality Date   ADENOIDECTOMY     BREAST LUMPECTOMY Right    BREAST LUMPECTOMY Right    MASS EXCISION Right 10/07/2014   Procedure: EXCISION OF RIGHT BREAST MASS;  Surgeon: Adalberto Hollow, MD;  Location: [facility] OR;  Service: General;  Laterality: Right;   TONSILLECTOMY AND ADENOIDECTOMY     TOOTH EXTRACTION     TRANSESOPHAGEAL ECHOCARDIOGRAM (CATH LAB) N/A 01/10/2024   Procedure: TRANSESOPHAGEAL ECHOCARDIOGRAM;  Surgeon: Hugh Madura, MD;  Location: [facility] INVASIVE CV LAB;  Service: Cardiovascular;  Laterality: N/A;   Patient Active Problem List   Diagnosis Date Noted   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) 02/10/2024   Coping style affecting medical condition 01/19/2024   History of CVA (cerebrovascular accident) 01/12/2024   Stroke of right basal ganglia (HCC) 01/07/2024   Non-restorative sleep 05/26/2022   Sleep paralysis, 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   Vivid dream 05/26/2022   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   Excessive daytime sleepiness 05/26/2022   Sleep disturbance 03/17/2022   Panic attacks 02/11/2022   Vitamin B12 deficiency 07/30/2021   Vitamin D  deficiency 07/30/2021   Family history of hypothyroidism 07/30/2021   GAD (generalized anxiety disorder) 03/18/2020   Fatigue 11/06/2019   Chronic back pain 07/06/2019   Preventative health care 04/27/2018   Migraines 04/25/2018   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   Anemia 01/04/2018   Headache disorder 12/29/2015   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   Sinus tachycardia 12/22/2015   Dysautonomia (HCC) 12/22/2015   Dysautonomia, familial (HCC) 04/22/2015   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   Neurocardiogenic syncope 04/06/2013   Intermittent palpitations 04/06/2013    ONSET DATE: 01/26/2024 (referral date)  REFERRING DIAG: I63.81 (ICD-10-CM) - Other cerebral infarction due to occlusion or stenosis of small artery   THERAPY DIAG:  Other symptoms and signs involving the musculoskeletal system  Other lack of coordination  Hemiplegia and hemiparesis following cerebral infarction affecting left non-dominant side (  HCC)  Other symptoms and signs involving the nervous system  Muscle weakness (generalized)  Rationale for Evaluation and Treatment: Rehabilitation  SUBJECTIVE:   SUBJECTIVE STATEMENT: Pronunciation: "Bree-Anne-ah"  Pt reported affected hand feeling stiff this morning. Pt reported continuing to take Baclofen . Pt reported hand feeling stiff similar to feeling prior to beginning to take Baclofen . Pt reported feeling like the left arm "wants to come in" and demo'd  action of elbow and digit flexion.   Pt accompanied by: self and mother   PERTINENT HISTORY: familiar hemiplegic migraine, angioedema 2017,dysautonomia; infarct right internal capsule/ R basal ganglia stroke   MRI of brain on 01/07/24   IMPRESSION: 1. 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 2. Susceptibility related calcifications in the globus pallidus bilaterally. This likely reflects the sequela of chronic microvascular ischemia.  PRECAUTIONS: Fall, Per 02/10/24 MD Office Visit: Pt may gradually begin to return to driving.   WEIGHT BEARING RESTRICTIONS: No  PAIN:  Are you having pain? No  FALLS: Has patient fallen in last 6 months? Yes when CVA event occurred though none since then   LIVING ENVIRONMENT: Lives with: lives with their family Lives in: House/apartment Stairs: Yes: External: 4 steps; bilateral but cannot reach both Has following equipment at home: shower chair  PLOF: Independent, typically living ind, worked as Engineer, civil (consulting)  PATIENT GOALS: "I want to get my hand and arm back and be able to go to work."  OBJECTIVE:  Note: Objective measures were completed at Evaluation unless otherwise noted.  HAND DOMINANCE: Right  ADLs: Overall ADLs:  Transfers/ambulation related to ADLs: Eating: ind Grooming: difficulty with completing hair styling e.g. putting hair up in ponytail, difficulty with curling hair. Uses RUE to complete toothbrushing and brushing hair.  Difficulty with clipping fingernails. UB Dressing: ind, extra time. Buttons and zippers difficult.  LB Dressing: Ind, difficulty with pulling up pants and buttoning buttons on jeans, extra time for tying shoes Toileting: ind  Bathing: difficulty washing hair d/t unable to apply pressure while moving LUE, typically uses RUE to ensure thoroughness, shower bench is available PRN Tub Shower transfers: walk-in shower, pt reported SOB after showering or when walking a lot,  sometimes requires rest breaks Equipment: Shower seat with back and Walk in shower  IADLs: Shopping: assistance from parents but pt participates, such as trying to reach for items on shelf and pushing cart Light housekeeping: pt completes dishwashing, has not attempted laundry Meal Prep: pt reported cooking but task is tiring Community mobility: has attempted to drive 1x with parent  Handwriting: No change per pt.   Work activities: Pt expressed concerns with FM coordination and concerns about return to work considerations, such as coordination required for drawing up syringes from a vial for medication administration, completing IVs, typing, navigating phone.   MOBILITY STATUS: Independent  POSTURE COMMENTS:  Ind sitting balance, sometimes forward shoulder posture  ACTIVITY TOLERANCE: Activity tolerance: Pt reported "I'm doing more than I was doing in the hospital, but I definitely get tired easier and take more breaks."  FUNCTIONAL OUTCOME MEASURES: PSF: 3.0    Total score = sum of the activity scores/number of activities Minimum detectable change (90%CI) for average score = 2 points Minimum detectable change (90%CI) for single activity score = 3 points   UPPER EXTREMITY ROM:    Active ROM Right eval Left eval  Shoulder flexion [facility]  Shoulder abduction  [facility]  Shoulder adduction    Shoulder extension  Shoulder internal rotation    Shoulder external rotation    Elbow flexion  WFL - slightly delayed movements  Elbow extension    Wrist flexion    Wrist extension    Wrist ulnar deviation    Wrist radial deviation    Wrist pronation    Wrist supination    (Blank rows = not tested)  OT palpated pt's L shoulder and noted "tightness" of muscles at L shoulder.   UPPER EXTREMITY MMT:     MMT Right eval Left eval  Shoulder flexion 5 3+   HAND FUNCTION: Grip strength: Right: 48.9, 53.5, 53.3 (51.9 lbs average) lbs; Left: 24.2, 22.2, 22.7 (23.1 lbs average)  lbs  COORDINATION: 9 Hole Peg test: Right: 20 sec; Left: 40 sec  Pt reported practicing picking up pills, sorting change, and playing connect-4 at home.  SENSATION: Pt reported tingling in L thumb which has since resolves. Pt questioned if symptoms might have been from resting hand splint at night. Pt reported no other symptoms of numbness/tingling.  EDEMA: None noted  MUSCLE TONE: RUE: Within functional limits and LUE: slight ataxia  COGNITION: Overall cognitive status: Within functional limits for tasks assessed  Pt reported some difficulty with word-finding.  Pt reported sometimes asking same question again "a few hours apart." Pt's mother reported not noticing this recently.  VISION: Subjective report: Pt reported initial changes to vision (unable to read, blurry vision close-up and far away) though "okay now."  Baseline vision: No visual deficits Visual history: none noted  VISION ASSESSMENT: Pt accurately clock on wall and reported reading books on Kindle. Pt reported still using large print on phone since hospital.   Pt tracked easily to all 4 quadrants. Peripheral vision - WFL B sides, ?slightly decreased on L side though functional regardless  Patient has difficulty with following activities due to following visual impairments: None noted  PERCEPTION: Not tested  PRAXIS: Not tested  OBSERVATIONS: Pt ambulated ind. Pt soft-spoken and appeared tired during session. Pt was pleasant and appeared motivated to complete LUE functional tasks at home based on pt report.                                                                                                                             TREATMENT DATE:    Neuro Re-Ed Based on pt description of concerns of LUE stiffness, OT noted potential flexion synergy pattern though noted no evidence of flexion synergy pattern during session today. Pt reported the concerns most commonly occur at night, in the morning, when washing  hair, when ambulating, and when preparing cereal. OT educated pt and family on dx, prognosis, UE anatomy, UE positioning, being mindful of LUE positioning during activity, and strategies and exercises to counteract flexion synergy pattern (see pt instructions for handout). Pt and family acknowledged understanding of all.  Wrist prayer ext - 30 s hold, 3 reps - to prevent stiffness and tightness of affected UE.  Digit ext  stretch - 30 s hold, 3 sets - to prevent stiffness and tightness of affected UE.  Elbow flex/ext AROM - hold 10 second in ext position, 5 reps - to prevent stiffness and tightness of affected UE.  OT recommended to pt to complete these exercises when LUE stiffness occurs. Pt acknowledged understanding.   TherAct Natural ambulation pattern - OT educated pt on natural BUE positioning and BUE swing during ambulation. Focus on keeping Lt elbow extended to neutral positioning to prevent flexion pattern. Pt returned demo.  Picking up and sorting medium-sized objects with in-hand manipulation and finger-to-palm and palm-to-finger translation - to improve affected UE ROM, FM coordination, dexterity, in-hand manipulation.   Picking up medium-sized objects with medium-sized tongs - to improve affected UE ROM, FM coordination, dexterity.  Jenga mini blocks - Building patterns of blocks and removing Jenga mini blocks 1-at-a-time - to improve affected UE FM coordination and dexterity.  Based on pt questions, OT educated pt on playing board games or other games at home for [facility] coordination. OT educated pt on strategies to pick up cups from car cup holders to improve efficiency. Pt acknowledged understanding.  Placing/removing resistive clips - yellow, red, green, blue, black - to improve affected UE strengthening, coordination, and functional reach. Pt tolerated task well.  PATIENT EDUCATION: Education details: see today's tx above Person educated: Patient Education method: Explanation,  Demonstration, and Handouts Education comprehension: verbalized understanding  HOME EXERCISE PROGRAM: 02/09/24 - Affected UE ROM and Yellow Theraputty. Access Code: [email or web address]. 02/15/2024: L handed typing words 02/21/24 - Digit ext with theraputty or rubber band (access code: [email or web address]). Functional reach: Handwritten/Verbal instructions: 1 set in front of mirror, 1 set without mirror: Reaching overhead to touch top of head with affected hand, reaching overhead to touch back of head with affected hand, reaching arm forward and touching nose with affected UE - 10 reps, 2 sets, 2x per day.  GOALS: Goals reviewed with patient? Yes  SHORT TERM GOALS: Target date: 03/09/24  Pt will ind demo understanding of HEP using visual handouts. Baseline: new to outpt OT Goal status: in progress  2.  Pt will ind recall at least 3 energy conservation strategies. Baseline: Activity tolerance: Pt reported "I'm doing more than I was doing in the hospital, but I definitely get tired easier and take more breaks."  Goal status: in progress  3.  Pt and/or family will return demo of personal e-stim unit use and electrode placement for affected UE.  Baseline: pt reported she has ordered a personal e-stim unit.  02/09/24 - Education and handout provided. Pt returned demo of using personal e-stim unit for wrist flex/ext of affected UE. Goal status: MET  4.  Pt will report improved ability to use LUE during overhead reaching tasks with minimal use of RUE to compensate, such as hair care tasks and washing hair. Baseline: pt reported difficulty with hair styling (e.g. putting hair in ponytail, curling hair) Goal status: in progress  LONG TERM GOALS: Target date: 04/06/24  Patient will report at least two-point increase in average PSFS score or at least three-point increase in a single activity score indicating functionally significant improvement given minimum detectable change using adaptive strategies and A/E PRN. Baseline:  PSF: 3.0 total score (See above for individual activity scores)  Goal status: in progress  2.  Patient will demonstrate at least 30 lbs LUE grip strength as needed to open jars and other containers.  Baseline: Grip strength: Right: 48.9, 53.5, 53.3 (51.9 lbs average) lbs; Left:  24.2, 22.2, 22.7 (23.1 lbs average) lbs Goal status: in progress  3.  Patient will demo improved FM coordination as evidenced by completing nine-hole peg with use of LUE in 30 seconds or less.  Baseline: 9 Hole Peg test: Right: 20 sec; Left: 40 sec Goal status: in progress  4.  Pt will verbalize understanding of A/E, adaptive strategies for functional tasks as needed for return to work considerations. Baseline: Work activities: Pt expressed concerns with FM coordination and concerns about return to work considerations, such as coordination required for drawing up syringes from a vial for medication administration, completing IVs, typing, navigating phone.  Goal status: in progress  ASSESSMENT:  CLINICAL IMPRESSION: Pt tolerated tasks very well. Pt acknowledged understanding of all education today. Pt would benefit from skilled OT services to address deficits, increase overall independence, and return patient to PLOF as able.   PERFORMANCE DEFICITS: in functional skills including ADLs, IADLs, coordination, dexterity, proprioception, sensation, tone, ROM, strength, pain, muscle spasms, flexibility, Fine motor control, Gross motor control, mobility, balance, endurance, and UE functional use, cognitive skills including attention and energy/drive, and psychosocial skills including environmental adaptation.   IMPAIRMENTS: are limiting patient from ADLs, IADLs, rest and sleep, work, leisure, and social participation.   CO-MORBIDITIES: may have co-morbidities  that affects occupational performance. Patient will benefit from skilled OT to address above impairments and improve overall function.  REHAB POTENTIAL:  Good  PLAN:  OT FREQUENCY: 2x/week  OT DURATION: 6 weeks (dates extended to allow for scheduling)  PLANNED INTERVENTIONS: 97168 OT Re-evaluation, 97535 self care/ADL training, 16109 therapeutic exercise, 97530 therapeutic activity, 97112 neuromuscular re-education, 97140 manual therapy, 97035 ultrasound, 97018 paraffin, 60454 fluidotherapy, 97010 moist heat, 97010 cryotherapy, 97760 Orthotic Initial, 97761 Prosthetic Initial, 97763 Orthotic/Prosthetic subsequent, passive range of motion, functional mobility training, visual/perceptual remediation/compensation, energy conservation, patient/family education, and DME and/or AE instructions  RECOMMENDED OTHER SERVICES: PT eval completed, SLP eval scheduled  CONSULTED AND AGREED WITH PLAN OF CARE: Patient and family member  PLAN FOR NEXT SESSION:  Functional use of LUE: e.g. clipping nails, putting hair in ponytail, grooming, navigating phone, typing, see PSFS items  Review ROM and theraputty HEP PRN FM coordination and functional reaching activities - e.g. Blaze Pods, small pegs in-hand manipulation Zoom ball   Oakley Bellman, OT 02/23/2024, 3:36 PM

## 2024-02-23 NOTE — Therapy (Signed)
 OUTPATIENT PHYSICAL THERAPY NEURO TREATMENT   Patient Name: DANNILYNN PRUDEN MRN: [id number] DOB:11-04-1995, 28 y.o., female Today's Date: 02/23/2024   PCP: Gabriel John, NP REFERRING PROVIDER: Genetta Kenning, MD  END OF SESSION:  PT End of Session - 02/23/24 0933     Visit Number 7    Number of Visits 13    Date for PT Re-Evaluation 03/20/24    Authorization Type [employer]    PT Start Time [phone]    PT Stop Time 1013    PT Time Calculation (min) 42 min    Equipment Utilized During Treatment Gait belt    Activity Tolerance Patient tolerated treatment well    Behavior During Therapy [facility] for tasks assessed/performed                 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   Acute flank pain 06/02/2022   Angio-edema 04/27/2016   Angioedema    Aquagenic angio-edema-urticaria    Asthma    no problems /not used recently   Dysautonomia (HCC)    Dysrhythmia    sinus tachycardia   Fibroid    right breast, adenoma   Headache(784.0)    History of COVID-19 11/21/2020   Hives 12/22/2015   Pneumonia    hx  6th grade   Sensation of fullness in both ears 02/02/2023   Snoring 02/11/2022   Urinary frequency 02/12/2019   Vaginal yeast infection 06/02/2022   Vasculitis (HCC)    Past Surgical History:  Procedure Laterality Date   ADENOIDECTOMY     BREAST LUMPECTOMY Right    BREAST LUMPECTOMY Right    MASS EXCISION Right 10/07/2014   Procedure: EXCISION OF RIGHT BREAST MASS;  Surgeon: Adalberto Hollow, MD;  Location: [facility] OR;  Service: General;  Laterality: Right;   TONSILLECTOMY AND ADENOIDECTOMY     TOOTH EXTRACTION     TRANSESOPHAGEAL ECHOCARDIOGRAM (CATH LAB) N/A 01/10/2024   Procedure: TRANSESOPHAGEAL ECHOCARDIOGRAM;  Surgeon: Hugh Madura, MD;  Location: [facility] INVASIVE CV LAB;  Service: Cardiovascular;  Laterality: N/A;   Patient Active Problem List   Diagnosis Date Noted   Spastic hemiparesis of left  nondominant side due to acute cerebral infarction (HCC) 02/10/2024   Coping style affecting medical condition 01/19/2024   History of CVA (cerebrovascular accident) 01/12/2024   Stroke of right basal ganglia (HCC) 01/07/2024   Non-restorative sleep 05/26/2022   Sleep paralysis, 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   Vivid dream 05/26/2022   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   Excessive daytime sleepiness 05/26/2022   Sleep disturbance 03/17/2022   Panic attacks 02/11/2022   Vitamin B12 deficiency 07/30/2021   Vitamin D  deficiency 07/30/2021   Family history of hypothyroidism 07/30/2021   GAD (generalized anxiety disorder) 03/18/2020   Fatigue 11/06/2019   Chronic back pain 07/06/2019   Preventative health care 04/27/2018   Migraines 04/25/2018   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   Anemia 01/04/2018   Headache disorder 12/29/2015   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   Sinus tachycardia 12/22/2015   Dysautonomia (HCC) 12/22/2015   Dysautonomia, familial (HCC) 04/22/2015   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   Neurocardiogenic syncope 04/06/2013   Intermittent palpitations 04/06/2013    ONSET DATE: 01/26/2024 (referral)   REFERRING DIAG:  I63.81 (ICD-10-CM) - Other cerebral infarction due to occlusion or stenosis of small artery    THERAPY DIAG:  Muscle weakness (generalized)  Other abnormalities of gait and mobility  Unsteadiness on feet  Rationale for Evaluation and Treatment: Rehabilitation  SUBJECTIVE:                                                                                                                                                                                             SUBJECTIVE STATEMENT: Pt reports she feels tight in her L hand and L leg, started last night. May try taking full dose of Baclofen . Is sore in her quads from last session. No falls. Still has not received her Birkenstocks   Pt accompanied by: Self, Mom   PERTINENT HISTORY:  familiar hemiplegic migraine, angioedema 2017,dysautonomia; infarct right internal capsule/ R basal ganglia stroke  PAIN:  Are you having pain? No    PRECAUTIONS: Fall  RED FLAGS: None   WEIGHT BEARING RESTRICTIONS: No  FALLS: Has patient fallen in last 6 months? No  LIVING ENVIRONMENT: Lives with: lives with their family Lives in: House/apartment Stairs: Yes: External: 4 steps; bilateral but cannot reach both Has following equipment at home: shower chair  PLOF: Independent  PATIENT GOALS: "Being normal again. Being able to go back to work and be independent"   OBJECTIVE:  Note: Objective measures were completed at Evaluation unless otherwise noted.  DIAGNOSTIC FINDINGS: MRI of brain on 01/07/24  IMPRESSION: 1. 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 2. Susceptibility related calcifications in the globus pallidus bilaterally. This likely reflects the sequela of chronic microvascular ischemia.  COGNITION: Overall cognitive status: Within functional limits for tasks assessed   SENSATION: Pt denies numbness/tingling in L hemibody   MUSCLE TONE: Non-fatiguing clonus of LLE  POSTURE: No Significant postural limitations  LOWER EXTREMITY ROM:     Active  Right Eval Left Eval  Hip flexion    Hip extension    Hip abduction    Hip adduction    Hip internal rotation    Hip external rotation    Knee flexion    Knee extension    Ankle dorsiflexion    Ankle plantarflexion    Ankle inversion    Ankle eversion     (Blank rows = not tested)  LOWER EXTREMITY MMT:  Tested in seated position   MMT Right Eval Left Eval  Hip flexion 5 4+  Hip extension    Hip abduction 5 5  Hip adduction 5 5  Hip internal rotation    Hip external rotation    Knee flexion 5 5  Knee extension 5 4+  Ankle dorsiflexion 5 5  Ankle plantarflexion    Ankle inversion    Ankle eversion    (Blank rows = not tested)  BED MOBILITY:  Not  tested  TRANSFERS: Sit to stand: Complete Independence  Assistive device utilized: None     Stand to sit: Complete Independence  Assistive device utilized: None      RAMP:  Not tested  CURB:  Not tested  GAIT: Gait pattern: step through pattern, decreased arm swing- Left, decreased step length- Right, decreased stance time- Left, decreased stride length, decreased hip/knee flexion- Left, lateral hip instability, and wide BOS Distance walked: Various clinic distances  Assistive device utilized: None Level of assistance: Modified independence Comments: Pt walks very guarded, as if she feels unstable. No instability noted other than single instance of L knee buckling which pt able to self-correct.    PATIENT SURVEYS:  ABC scale to be obtained                                                                                                                               TREATMENT:    Ther Act  Elliptical level 1 for 8 minutes (4 minutes fwd and 4 retro) for improved functional BLE strength and cardiovascular warmup. No rest break required this date. RPE of 6/10 following activity.   NMR  6 Blaze pods on random reach setting for improved agility, squat strength, dual-tasking and LUE coordination.  Performed on 2 minute intervals with 90s rest periods.  Pt requires SBA-CGA guarding. Round 1:  6 pods placed in straight line over 15' w/cues to side step and squat down to touch pods.  13 hits. Round 2:  added red theraband around quads for added hip abduction strength. 14 hits. Round 3:  same setup as round 2 w/added cog dual-task (naming foods if pod is blue and animals if pod is red).  16 hits. Notable errors/deficits:  Pt most challenged by addition of red theraband. Min cues to avoid full depth squat, as pt unstable here  Sit to stands w/6# slam ball to prime for next movement, x5 reps. Pt initially dropping ball rather than slamming it, resulting in inability to catch ball. Cued pt to  slam ball (use her LUE) and pt able to do well.  Clock yourself for 2 min rounds for improved cog dual-task, LE coordination and set--switching:   Round 1: simple clock w/full clockface on 30 spm w/6# slam ball at each number. Pt initially reporting the speed of movement is too fast, but provided max encouragement to try and pt able to perform well w/no LOB  Round 2: athletic agility clock at 30 spm. Pt most challenged by jumping tasks and reported high levels of fatigue w/movement.      PATIENT EDUCATION: Education details: continue HEP, work on squats w/3s eccentric lower and fast concentric  Person educated: Patient and Parent Education method: Medical illustrator Education comprehension: verbalized understanding and needs further education  HOME EXERCISE PROGRAM: Access Code: [email or web address] URL: [email or web address] Date: 02/13/2024 Prepared by: Burleigh Carp Tara Wich  Exercises - Half Kneeling Lift  - 1  x daily - 7 x weekly - 1 sets - 10 reps - Down Dog with Bent Knees and Heels on Mat  - 1 x daily - 7 x weekly - 2-3 reps - 15-30 seconds hold - Bird Dog  - 1 x daily - 7 x weekly - 3 sets - 10 reps  From CIR Therapist: Also instructed pt in 2 additional exercises but no printed versions available: - tall kneeling to half kneeling - sit to half kneeling and then return to sit. Perform to each side.   Verbally added 02/21/24: -squats at countertop with chair behind you  GOALS: Goals reviewed with patient? Yes  SHORT TERM GOALS: Target date: 02/28/2024   Pt will improve FGA to 28/30 for decreased fall risk   Baseline: 21/30 Goal status: INITIAL  2.  HiMat to be assessed at STG check and LTG updated  Baseline:  Goal status: INITIAL  3.  Pt will improve gait velocity to at least 3.4 ft/s for improved gait efficiency and return to PLOF  Baseline:  Goal status: INITIAL  4.  ABC scale to be assessed and LTG updated  Baseline:  Goal status: INITIAL  5.  Pt  will demonstrate proper lifting technique of 25# weight from floor for proper body mechanics and safety at work Baseline:  Goal status: INITIAL  LONG TERM GOALS: Target date: 03/13/2024   Pt will improve gait velocity to at least 3.8 ft/s for improved gait efficiency and independence  Baseline:  Goal status: INITIAL  2.  HiMat goal  Baseline:  Goal status: INITIAL  3.  ABC scale goal  Baseline:  Goal status: INITIAL  4.  Pt will be able to carry 10# object in BUEs up/down 4 steps for improved coordination and safety in work environment   Baseline: can only carry w/single UE  Goal status: INITIAL  5.  Pt will perform [place] get up w/5# weight for improved shoulder stability OH, core stability and high level balance  Baseline:  Goal status: INITIAL   ASSESSMENT:  CLINICAL IMPRESSION: Emphasis of skilled PT session on agility, cog dual-tasking and high level coordination of LUE and LLE. Pt continues to be self-limiting in that she requires max cues to perform difficult tasks. Pt continues to be limited by fatigue, but was able to complete 3 full rounds of blaze pod task w/squats. Pt continues to inquire about her safety at work but is resistant to therapist's recommendations. Continue POC.    OBJECTIVE IMPAIRMENTS: Abnormal gait, decreased activity tolerance, decreased balance, decreased coordination, decreased endurance, decreased mobility, decreased strength, increased muscle spasms, impaired UE functional use, improper body mechanics, and pain  ACTIVITY LIMITATIONS: carrying, lifting, squatting, stairs, reach over head, hygiene/grooming, and locomotion level  PARTICIPATION LIMITATIONS: meal prep, cleaning, laundry, interpersonal relationship, driving, shopping, community activity, occupation, and yard work  PERSONAL FACTORS: 1 comorbidity: R internal capsule infarct/R basal ganglia stroke are also affecting patient's functional outcome.   REHAB POTENTIAL:  Excellent  CLINICAL DECISION MAKING: Evolving/moderate complexity  EVALUATION COMPLEXITY: Moderate  PLAN:  PT FREQUENCY: 2-3x/week (starting w/2x following eval)  PT DURATION: 6 weeks  PLANNED INTERVENTIONS: 44010- PT Re-evaluation, 97750- Physical Performance Testing, 97110-Therapeutic exercises, 97530- Therapeutic activity, 97112- Neuromuscular re-education, 97535- Self Care, 27253- Manual therapy, [phone]- Gait training, [phone]- Canalith repositioning, [phone]- Aquatic Therapy, Patient/Family education, Balance training, Stair training, Taping, Dry Needling, Joint mobilization, Spinal mobilization, Vestibular training, and DME instructions  PLAN FOR NEXT SESSION: ABC scale and update goal. Review HEP from CIR and update  prn. High level agility, elliptical, L quad eccentric strength, OH tasks. Practice w/plastic needles Sonia Durand has these) in oranges and pin the needle to targets? Cognitive dual tasking, needs to work on deep squats and return to standing from squats, agility drills    Quanah Majka E Brinkley Peet, PT, DPT  02/23/2024, 11:20 AM

## 2024-02-23 NOTE — Progress Notes (Signed)
 Faith Jordan [id number] [birth year] 28 y.o.  Virtual Visit via Video Note  I connected with pt @ on 02/23/24 at  2:30 PM EDT by a video enabled telemedicine application and verified that I am speaking with the correct person using two identifiers.   I discussed the limitations of evaluation and management by telemedicine and the availability of in person appointments. The patient expressed understanding and agreed to proceed.  I discussed the assessment and treatment plan with the patient. The patient was provided an opportunity to ask questions and all were answered. The patient agreed with the plan and demonstrated an understanding of the instructions.   The patient was advised to call back or seek an in-person evaluation if the symptoms worsen or if the condition fails to improve as anticipated.  I provided 25 minutes of non-face-to-face time during this encounter.  The patient was located at home.  The provider was located at [facility].   Reagan Camera, NP   Subjective:   Patient ID:  Faith Jordan is a 28 y.o. (DOB 1996/07/26) female.  Chief Complaint: No chief complaint on file.   HPI ATIANA MALICK presents for follow-up of GAD, OCD, panic disorder, and insomnia.  Referred by therapist - Bambi Cottle.  Describes mood today as "ok". Pleasant. Reports tearfulness. Mood symptoms - reports depression and anxiety. Reports decreased interest and motivation. Denies irritability. Denies recent panic attacks. Denies worry, rumination, and over thinking. Denies obsessive thoughts and acts. Reports mood as lower. Reports recovering at home from a stroke in March. Stating "I feel like I'm doing ok - just a hard couple of weeks". Feels like medications are helpful and does not wish to make any changes at this time. Taking medications as prescribed.   Energy levels lower. 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   Enjoys some usual interests and activities. Married, but separated. Lives with  parents currently - plans to go back home at the end of May. Spending time with family.  Appetite decreased, but eating. Weight loss 10 pounds. Sleeps well most nights. Averages 12 hours and naps an hour or two during the day. Reports focus and concentration difficulties with recent strokes. Completing tasks. Managing aspects of household. Out of work currently- recent stroke Denies SI or HI.  Denies AH or VH. Denies self harm. Denies substance use.  Previous medication trials:  Cymbalta , Gabapentin , Trazadone, Hydroxyzine , Propranolol , Wellbutrin , Lorazepam  and Lexapro , Prozac , Zoloft , Prazosin , Effexor   Review of Systems:  Review of Systems  Musculoskeletal:  Negative for gait problem.  Neurological:  Negative for tremors.  Psychiatric/Behavioral:         Please refer to HPI    Medications: I have reviewed the patient's current medications.  Current Outpatient Medications  Medication Sig Dispense Refill   acetaminophen  (TYLENOL ) 325 MG tablet Take 2 tablets (650 mg total) by mouth every 6 (six) hours as needed for mild pain (pain score 1-3), fever or headache.     atorvastatin  (LIPITOR ) 40 MG tablet Take 1 tablet (40 mg total) by mouth daily for cholesterol. 90 tablet 3   baclofen  (LIORESAL ) 10 MG tablet Take 1 tablet (10 mg total) by mouth 3 (three) times daily. 270 each 3   botulinum toxin Type A (BOTOX) 200 units injection Provider to inject 155 units into the muscles of the head and neck every 12 weeks. Discard remainder. 1 each 3   clopidogrel  (PLAVIX ) 75 MG tablet Take 1 tablet (75 mg total) by mouth daily. 90 tablet 3  FLUoxetine  (PROZAC ) 40 MG capsule Take 1 capsule (40 mg total) by mouth daily. 30 capsule 0   Galcanezumab -gnlm (EMGALITY ) 120 MG/ML SOAJ Inject into the skin.     Lactobacillus (ACIDOPHILUS) CAPS capsule Take 1 capsule by mouth daily. 30 capsule 0   LORazepam  (ATIVAN ) 1 MG tablet Take 1 tablet (1 mg total) by mouth every 6 (six) hours as needed for anxiety.  30 tablet 0   topiramate  (TOPAMAX ) 50 MG tablet Take 1 tablet (50 mg total) by mouth daily. May also take 1 tablet (50 mg total) daily as needed (daily during menstrual cycle). 180 tablet 3   traZODone  (DESYREL ) 100 MG tablet Take 1 tablet (100 mg total) by mouth at bedtime. 30 tablet 0   Zavegepant HCl (ZAVZPRET ) 10 MG/ACT SOLN Place 1 spray into the nose daily as needed. 6 each    No current facility-administered medications for this visit.    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, blistering peeling skin   Nsaids Dermatitis    Reaction similar to Stephens-Johnson, blistering peeling rash   Sulfa Antibiotics Swelling    Facial swelling    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   Acute flank pain 06/02/2022   Angio-edema 04/27/2016   Angioedema    Aquagenic angio-edema-urticaria    Asthma    no problems /not used recently   Dysautonomia (HCC)    Dysrhythmia    sinus tachycardia   Fibroid    right breast, adenoma   Headache(784.0)    History of COVID-19 11/21/2020   Hives 12/22/2015   Pneumonia    hx  6th grade   Sensation of fullness in both ears 02/02/2023   Snoring 02/11/2022   Urinary frequency 02/12/2019   Vaginal yeast infection 06/02/2022   Vasculitis (HCC)     Family History  Problem Relation Age of Onset   Depression Mother    Migraines Mother    Narcolepsy Mother    Hypothyroidism Mother    Myasthenia gravis Maternal Grandfather    Breast cancer Maternal Grandmother 84   Asthma Other    Cancer Other    Epilepsy Other    Allergic rhinitis Neg Hx    Angioedema Neg Hx    Atopy Neg Hx    Eczema Neg Hx    Immunodeficiency Neg Hx    Urticaria Neg Hx     Social History   Socioeconomic History   Marital status: Significant Other    Spouse name: Not on file   Number of children: Not on file    Years of education: Not on file   Highest education level: Bachelor's degree (e.g., BA, AB, BS)  Occupational History   Not on file  Tobacco Use   Smoking status: Never   Smokeless tobacco: Never  Vaping Use   Vaping status: Never Used  Substance and Sexual Activity   Alcohol use: No   Drug use: No   Sexual activity: Yes  Other Topics Concern   Not on file  Social History Narrative   Lives with fiance   R handed   Caffeine : 1 drink a day   Social Drivers of Corporate investment banker Strain: Not on file  Food Insecurity: No Food Insecurity (01/09/2024)   Hunger Vital Sign    Worried About Running Out of Food in the Last Year: Never true  Ran Out of Food in the Last Year: Never true  Transportation Needs: No Transportation Needs (01/09/2024)   PRAPARE - Administrator, Civil Service (Medical): No    Lack of Transportation (Non-Medical): No  Physical Activity: Not on file  Stress: Not on file  Social Connections: Not on file  Intimate Partner Violence: Not At Risk (01/09/2024)   Humiliation, Afraid, Rape, and Kick questionnaire    Fear of Current or Ex-Partner: No    Emotionally Abused: No    Physically Abused: No    Sexually Abused: No    Past Medical History, Surgical history, Social history, and Family history were reviewed and updated as appropriate.   Please see review of systems for further details on the patient's review from today.   Objective:   Physical Exam:  LMP 01/08/2024 (Exact Date)   Physical Exam Constitutional:      General: She is not in acute distress. Musculoskeletal:        General: No deformity.  Neurological:     Mental Status: She is alert and oriented to person, place, and time.     Coordination: Coordination normal.  Psychiatric:        Attention and Perception: Attention and perception normal. She does not perceive auditory or visual hallucinations.        Mood and Affect: Mood normal. Mood is not anxious or depressed.  Affect is not labile, blunt, angry or inappropriate.        Speech: Speech normal.        Behavior: Behavior normal.        Thought Content: Thought content normal. Thought content is not paranoid or delusional. Thought content does not include homicidal or suicidal ideation. Thought content does not include homicidal or suicidal plan.        Cognition and Memory: Cognition and memory normal.        Judgment: Judgment normal.     Comments: Insight intact     Lab Review:     Component Value Date/Time   NA 140 02/07/2024 0950   K 4.2 02/07/2024 0950   CL 106 02/07/2024 0950   CO2 20 02/07/2024 0950   GLUCOSE 81 02/07/2024 0950   GLUCOSE 91 01/23/2024 0525   BUN 9 02/07/2024 0950   CREATININE 0.94 02/07/2024 0950   CALCIUM  10.1 02/07/2024 0950   CALCIUM  8.9 01/07/2024 2231   PROT 7.2 02/07/2024 0950   ALBUMIN 4.7 02/07/2024 0950   AST 19 02/07/2024 0950   ALT 28 02/07/2024 0950   ALKPHOS 106 02/07/2024 0950   BILITOT 0.8 02/07/2024 0950   GFRNONAA >60 01/23/2024 0525   GFRAA >60 12/01/2017 1018       Component Value Date/Time   WBC 7.7 02/07/2024 0950   WBC 9.0 01/23/2024 0525   RBC 5.19 02/07/2024 0950   RBC 4.85 01/23/2024 0525   HGB 15.0 02/07/2024 0950   HCT 45.6 02/07/2024 0950   PLT 269 02/07/2024 0950   MCV 88 02/07/2024 0950   MCH 28.9 02/07/2024 0950   MCH 28.7 01/23/2024 0525   MCHC 32.9 02/07/2024 0950   MCHC 33.8 01/23/2024 0525   RDW 13.1 02/07/2024 0950   LYMPHSABS 2.0 02/07/2024 0950   MONOABS 0.9 01/13/2024 0655   EOSABS 0.0 02/07/2024 0950   BASOSABS 0.0 02/07/2024 0950    No results found for: "POCLITH", "LITHIUM"   No results found for: "PHENYTOIN", "PHENOBARB", "VALPROATE", "CBMZ"   .res Assessment: Plan:    Plan:  PDMP reviewed  Ativan  0.5mg  BID prn anxiety - not taking daily Prozac  40mg  daily Trazadone 100mg  at hs  Genesight testing on file.  RTC 6 weeks  25 minutes spent dedicated to the care of this patient on the date of  this encounter to include pre-visit review of records, ordering of medication, post visit documentation, and face-to-face time with the patient discussing GAD, OCD, panic disorder and insomnia. Discussed continuing current medication regimen.  Patient advised to contact office with any questions, adverse effects, or acute worsening in signs and symptoms.  Discussed potential benefits, risk, and side effects of benzodiazepines to include potential risk of tolerance and dependence, as well as possible drowsiness.  Advised patient not to drive if experiencing drowsiness and to take lowest possible effective dose to minimize risk of dependence and tolerance.   There are no diagnoses linked to this encounter.   Please see After Visit Summary for patient specific instructions.  Future Appointments  Date Time Provider Department Center  02/23/2024  2:30 PM Sione Baumgarten, Ursula Gardner, NP [facility] None  02/28/2024  9:30 AM Tamar Fairly, CCC-SLP [facility]  02/28/2024 10:15 AM Oakley Bellman, OT [facility]  02/28/2024 11:00 AM Lorita Rosa, PT [facility]  03/02/2024  9:00 AM Cottle, Tom Found, LCSW [facility] None  03/02/2024 11:45 AM Lorita Rosa, PT [facility]  03/02/2024 12:30 PM Altamease Asters, OT [facility]  03/05/2024  8:00 AM Plaster, Curtistine Downer, PT [facility]  03/05/2024  8:45 AM Oakley Bellman, OT [facility]  03/07/2024  8:45 AM Lorita Rosa, PT [facility]  03/07/2024  9:30 AM Altamease Asters, OT [facility]  03/13/2024  8:00 AM Plaster, Curtistine Downer, PT [facility]  03/13/2024  8:45 AM Oakley Bellman, OT [facility]  03/13/2024  9:30 AM Alston Jerry, CCC-SLP [facility]  03/14/2024  4:00 PM Cottle, Bambi G, LCSW [facility] None  03/15/2024  8:00 AM Oakley Bellman, OT [facility]  03/15/2024  8:45 AM Lorita Rosa, PT [facility]  03/20/2024  8:00 AM Plaster, Curtistine Downer, PT [facility]  03/20/2024  8:45 AM Zora Hires, OT [facility]  03/20/2024   9:30 AM Ray Caffey I, CCC-SLP [facility]  03/21/2024  3:30 PM Terrilyn Fick, NP [facility] None  03/23/2024  9:45 AM Kirsteins, Cecilia Coe, MD [facility] CPR  04/03/2024  3:30 PM Glory Larsen, MD [facility] None  04/23/2024  8:45 AM [facility] LAB [facility] PEC  11/26/2024 10:30 AM Lomax, Amy, NP [facility] None    No orders of the defined types were placed in this encounter.     -------------------------------

## 2024-02-23 NOTE — Patient Instructions (Signed)
 To decrease stiffness of LUE:  Heat pack - 10-15 minutes - to help relax muscles  Wrist prayer stretch - (make sure to keep palms together) - 30 seconds hold, 3 sets Finger extension - (stretch fingers on table or with other hand) - 30 seconds hold, 3 sets Elbow extension (hold 10 seconds) then bend elbow to touch nose then return to elbow extension (hold 10 seconds) - 5 reps

## 2024-02-24 ENCOUNTER — Encounter (HOSPITAL_COMMUNITY): Payer: Self-pay

## 2024-02-24 ENCOUNTER — Emergency Department (HOSPITAL_COMMUNITY)

## 2024-02-24 ENCOUNTER — Emergency Department (HOSPITAL_COMMUNITY)
Admission: EM | Admit: 2024-02-24 | Discharge: 2024-02-24 | Disposition: A | Discharge status: Home / Self Care | Attending: Emergency Medicine | Admitting: Emergency Medicine

## 2024-02-24 ENCOUNTER — Other Ambulatory Visit: Payer: Self-pay

## 2024-02-24 DIAGNOSIS — R4781 Slurred speech: Secondary | ICD-10-CM | POA: Insufficient documentation

## 2024-02-24 DIAGNOSIS — R29810 Facial weakness: Secondary | ICD-10-CM

## 2024-02-24 DIAGNOSIS — R531 Weakness: Secondary | ICD-10-CM | POA: Diagnosis not present

## 2024-02-24 DIAGNOSIS — Z8673 Personal history of transient ischemic attack (TIA), and cerebral infarction without residual deficits: Secondary | ICD-10-CM | POA: Insufficient documentation

## 2024-02-24 DIAGNOSIS — G43809 Other migraine, not intractable, without status migrainosus: Secondary | ICD-10-CM | POA: Diagnosis not present

## 2024-02-24 DIAGNOSIS — R471 Dysarthria and anarthria: Secondary | ICD-10-CM

## 2024-02-24 DIAGNOSIS — G43109 Migraine with aura, not intractable, without status migrainosus: Secondary | ICD-10-CM | POA: Diagnosis not present

## 2024-02-24 DIAGNOSIS — R262 Difficulty in walking, not elsewhere classified: Secondary | ICD-10-CM | POA: Diagnosis not present

## 2024-02-24 DIAGNOSIS — R29818 Other symptoms and signs involving the nervous system: Secondary | ICD-10-CM | POA: Diagnosis not present

## 2024-02-24 DIAGNOSIS — I69359 Hemiplegia and hemiparesis following cerebral infarction affecting unspecified side: Secondary | ICD-10-CM | POA: Diagnosis not present

## 2024-02-24 LAB — COMPREHENSIVE METABOLIC PANEL WITH GFR
ALT: 27 U/L (ref 0–44)
AST: 25 U/L (ref 15–41)
Albumin: 3.9 g/dL (ref 3.5–5.0)
Alkaline Phosphatase: 85 U/L (ref 38–126)
Anion gap: 13 (ref 5–15)
BUN: 6 mg/dL (ref 6–20)
CO2: 20 mmol/L — ABNORMAL LOW (ref 22–32)
Calcium: 9.3 mg/dL (ref 8.9–10.3)
Chloride: 108 mmol/L (ref 98–111)
Creatinine, Ser: 0.88 mg/dL (ref 0.44–1.00)
GFR, Estimated: >60 mL/min (ref >60)
Glucose, Bld: 129 mg/dL — ABNORMAL HIGH (ref 70–99)
Potassium: 3.5 mmol/L (ref 3.5–5.1)
Sodium: 141 mmol/L (ref 135–145)
Total Bilirubin: 0.9 mg/dL (ref 0.0–1.2)
Total Protein: 6.8 g/dL (ref 6.5–8.1)

## 2024-02-24 LAB — CBG MONITORING, ED
Glucose-Capillary: 127 mg/dL — ABNORMAL HIGH (ref 70–99)
Glucose-Capillary: 102 mg/dL — ABNORMAL HIGH (ref 70–99)

## 2024-02-24 LAB — I-STAT CHEM 8, ED
BUN: 6 mg/dL (ref 6–20)
Calcium, Ion: 1.07 mmol/L — ABNORMAL LOW (ref 1.15–1.40)
Chloride: 107 mmol/L (ref 98–111)
Creatinine, Ser: 0.9 mg/dL (ref 0.44–1.00)
Glucose, Bld: 122 mg/dL — ABNORMAL HIGH (ref 70–99)
HCT: 42 % (ref 36.0–46.0)
Hemoglobin: 14.3 g/dL (ref 12.0–15.0)
Potassium: 4 mmol/L (ref 3.5–5.1)
Sodium: 139 mmol/L (ref 135–145)
TCO2: 19 mmol/L — ABNORMAL LOW (ref 22–32)

## 2024-02-24 LAB — CBC
HCT: 42.2 % (ref 36.0–46.0)
Hemoglobin: 14.2 g/dL (ref 12.0–15.0)
MCH: 29 pg (ref 26.0–34.0)
MCHC: 33.6 g/dL (ref 30.0–36.0)
MCV: 86.3 fL (ref 80.0–100.0)
Platelets: 278 10*3/uL (ref 150–400)
RBC: 4.89 MIL/uL (ref 3.87–5.11)
RDW: 13 % (ref 11.5–15.5)
WBC: 9.3 10*3/uL (ref 4.0–10.5)
nRBC: 0 % (ref 0.0–0.2)

## 2024-02-24 LAB — DIFFERENTIAL
Abs Immature Granulocytes: 0.03 10*3/uL (ref 0.00–0.07)
Basophils Absolute: 0 10*3/uL (ref 0.0–0.1)
Basophils Relative: 0 %
Eosinophils Absolute: 0.1 10*3/uL (ref 0.0–0.5)
Eosinophils Relative: 1 %
Immature Granulocytes: 0 %
Lymphocytes Relative: 22 %
Lymphs Abs: 2.1 10*3/uL (ref 0.7–4.0)
Monocytes Absolute: 0.8 10*3/uL (ref 0.1–1.0)
Monocytes Relative: 8 %
Neutro Abs: 6.4 10*3/uL (ref 1.7–7.7)
Neutrophils Relative %: 69 %

## 2024-02-24 LAB — PROTIME-INR
INR: 1.1 (ref 0.8–1.2)
Prothrombin Time: 14.1 s (ref 11.4–15.2)

## 2024-02-24 LAB — HCG, SERUM, QUALITATIVE: Preg, Serum: NEGATIVE

## 2024-02-24 LAB — APTT: aPTT: 35 s (ref 24–36)

## 2024-02-24 LAB — ETHANOL: Alcohol, Ethyl (B): <15 mg/dL (ref <15)

## 2024-02-24 MED ORDER — PROCHLORPERAZINE EDISYLATE 10 MG/2ML IJ SOLN
10.0000 mg | Freq: Once | INTRAMUSCULAR | Status: DC
Start: 2024-02-24 — End: 2024-02-24

## 2024-02-24 MED ORDER — IOHEXOL 350 MG/ML SOLN
75.0000 mL | Freq: Once | INTRAVENOUS | Status: AC | PRN
  Administered 2024-02-24: 75 mL via INTRAVENOUS

## 2024-02-24 MED ORDER — KETOROLAC TROMETHAMINE 30 MG/ML IJ SOLN
15.0000 mg | Freq: Once | INTRAMUSCULAR | Status: AC
  Administered 2024-02-24: 15 mg via INTRAVENOUS
  Filled 2024-02-24: qty 1

## 2024-02-24 MED ORDER — SODIUM CHLORIDE 0.9% FLUSH
3.0000 mL | Freq: Once | INTRAVENOUS | Status: AC
  Administered 2024-02-24: 3 mL via INTRAVENOUS

## 2024-02-24 MED ORDER — SODIUM CHLORIDE 0.9 % IV SOLN: Freq: Once | INTRAVENOUS | Status: AC

## 2024-02-24 MED ORDER — DIPHENHYDRAMINE HCL 50 MG/ML IJ SOLN: 25.0000 mg | Freq: Once | INTRAMUSCULAR | Status: DC

## 2024-02-24 MED ORDER — METOCLOPRAMIDE HCL 10 MG PO TABS
10.0000 mg | ORAL_TABLET | Freq: Once | ORAL | Status: AC
  Administered 2024-02-24: 10 mg via ORAL
  Filled 2024-02-24: qty 1

## 2024-02-24 NOTE — Discharge Instructions (Signed)
 1.  Continue your regularly prescribed medications 2.  Follow-up with your neurologist as soon as possible 3.  Return to the emergency department if you have recurrence of symptoms

## 2024-02-24 NOTE — Consult Note (Signed)
 NEUROLOGY CONSULT NOTE   Date of service: Feb 24, 2024 Patient Name: Faith Jordan MRN:  [id number] DOB:  05-15-1996 Chief Complaint: "CODE STROKE" Requesting Provider: Wynetta Heckle, MD  History of Present Illness  Faith Jordan is a 28 y.o. 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 Exam at bridge consistent with these symptoms. CTH and CTA negative.   LKW: 1400 Modified rankin score: 1-No significant post stroke disability and can perform usual duties with stroke symptoms IV Thrombolysis: No, not a candidate due to recent stroke EVT: No, no LVO   NIHSS components Score: Comment  1a Level of Conscious 0[x]  1[]  2[]  3[]      1b LOC Questions 0[x]  1[]  2[]       1c LOC Commands 0[x]  1[]  2[]       2 Best Gaze 0[x]  1[]  2[]       3 Visual 0[x]  1[]  2[]  3[]      4 Facial Palsy 0[x]  1[]  2[]  3[]      5a Motor Arm - left 0[x]  1[]  2[]  3[]  4[]  UN[]    5b Motor Arm - Right 0[x]  1[]  2[]  3[]  4[]  UN[]    6a Motor Leg - Left 0[]  1[x]  2[]  3[]  4[]  UN[]    6b Motor Leg - Right 0[x]  1[]  2[]  3[]  4[]  UN[]    7 Limb Ataxia 0[]  1[x]  2[]  UN[]      8 Sensory 0[x]  1[]  2[]  UN[]      9 Best Language 0[x]  1[]  2[]  3[]      10 Dysarthria 0[]  1[x]  2[]  UN[]      11 Extinct. and Inattention 0[x]  1[]  2[]       TOTAL:   3      ROS  Comprehensive ROS performed and pertinent positives documented in HPI   Past History   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   Acute flank pain 06/02/2022   Angio-edema 04/27/2016   Angioedema    Aquagenic angio-edema-urticaria    Asthma    no problems /not used recently   Dysautonomia (HCC)    Dysrhythmia    sinus tachycardia   Fibroid    right breast, adenoma   Headache(784.0)    History of COVID-19 11/21/2020   Hives  12/22/2015   Pneumonia    hx  6th grade   Sensation of fullness in both ears 02/02/2023   Snoring 02/11/2022   Urinary frequency 02/12/2019   Vaginal yeast infection 06/02/2022   Vasculitis (HCC)     Past Surgical History:  Procedure Laterality Date   ADENOIDECTOMY     BREAST LUMPECTOMY Right    BREAST LUMPECTOMY Right    MASS EXCISION Right 10/07/2014   Procedure: EXCISION OF RIGHT BREAST MASS;  Surgeon: Adalberto Hollow, MD;  Location:  [facility];  Service: General;  Laterality: Right;   TONSILLECTOMY AND ADENOIDECTOMY     TOOTH EXTRACTION     TRANSESOPHAGEAL ECHOCARDIOGRAM (CATH LAB) N/A 01/10/2024   Procedure: TRANSESOPHAGEAL ECHOCARDIOGRAM;  Surgeon: Hugh Madura, MD;  Location: [facility] INVASIVE CV LAB;  Service: Cardiovascular;  Laterality: N/A;    Family History: Family History  Problem Relation Age of Onset   Depression Mother    Migraines Mother    Narcolepsy Mother    Hypothyroidism Mother    Myasthenia gravis Maternal Grandfather    Breast cancer Maternal Grandmother 9  Asthma Other    Cancer Other    Epilepsy Other    Allergic rhinitis Neg Hx    Angioedema Neg Hx    Atopy Neg Hx    Eczema Neg Hx    Immunodeficiency Neg Hx    Urticaria Neg Hx     Social History  reports that she has never smoked. She has never used smokeless tobacco. She reports that she does not drink alcohol and does not use drugs.  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, blistering peeling skin   Nsaids Dermatitis    Reaction similar to Stephens-Johnson, blistering peeling rash   Sulfa Antibiotics Swelling    Facial swelling    Medications   Current Facility-Administered Medications:    sodium chloride  flush (NS) 0.9 % injection 3 mL, 3 mL, Intravenous, Once, Pfeiffer, Bufford Carne, MD  Current Outpatient Medications:    acetaminophen  (TYLENOL ) 325 MG tablet, Take 2 tablets (650 mg total) by mouth every 6 (six) hours as needed  for mild pain (pain score 1-3), fever or headache., Disp: , Rfl:    atorvastatin  (LIPITOR ) 40 MG tablet, Take 1 tablet (40 mg total) by mouth daily for cholesterol., Disp: 90 tablet, Rfl: 3   baclofen  (LIORESAL ) 10 MG tablet, Take 1 tablet (10 mg total) by mouth 3 (three) times daily., Disp: 270 each, Rfl: 3   botulinum toxin Type A  (BOTOX ) 200 units injection, Provider to inject 155 units into the muscles of the head and neck every 12 weeks. Discard remainder., Disp: 1 each, Rfl: 3   clopidogrel  (PLAVIX ) 75 MG tablet, Take 1 tablet (75 mg total) by mouth daily., Disp: 90 tablet, Rfl: 3   FLUoxetine  (PROZAC ) 40 MG capsule, Take 1 capsule (40 mg total) by mouth daily., Disp: 90 capsule, Rfl: 0   Galcanezumab -gnlm (EMGALITY ) 120 MG/ML SOAJ, Inject into the skin., Disp: , Rfl:    Lactobacillus (ACIDOPHILUS) CAPS capsule, Take 1 capsule by mouth daily., Disp: 30 capsule, Rfl: 0   LORazepam  (ATIVAN ) 1 MG tablet, Take 1 tablet (1 mg total) by mouth every 6 (six) hours as needed for anxiety., Disp: 30 tablet, Rfl: 0   topiramate  (TOPAMAX ) 50 MG tablet, Take 1 tablet (50 mg total) by mouth daily. May also take 1 tablet (50 mg total) daily as needed (daily during menstrual cycle)., Disp: 180 tablet, Rfl: 3   traZODone  (DESYREL ) 100 MG tablet, Take 1 tablet (100 mg total) by mouth at bedtime., Disp: 90 tablet, Rfl: 0   Zavegepant HCl (ZAVZPRET ) 10 MG/ACT SOLN, Place 1 spray into the nose daily as needed., Disp: 6 each, Rfl:   Vitals   Vitals:   02/24/24 1500  Weight: 72.2 kg    Body mass index is 24.93 kg/m.  Physical Exam   Constitutional: Appears well-developed and well-nourished. Psych: Affect appropriate to situation.  Eyes: No scleral injection.  HENT: No OP obstruction.  Head: Normocephalic.  Cardiovascular: Normal rate and regular rhythm.  Respiratory: Effort normal, non-labored breathing.  GI: Soft.  No distension. There is no tenderness.  Skin: WDI.   Neurologic Examination    Neuro: Mental Status: Patient is awake, alert, oriented to person, place, month, year, and situation. Patient is able to give a clear and coherent history. No signs of aphasia or neglect. Mild dysarthria present.  Cranial Nerves: II: Visual Fields are full. Pupils are equal, round, and reactive to light.   III,IV, VI: EOMI without ptosis or diploplia.  V: Facial  sensation is symmetric to temperature VII: Facial movement is symmetric.  VIII: hearing is intact to voice X: Uvula elevates symmetrically XI: Shoulder shrug is symmetric. XII: tongue is midline without atrophy or fasciculations.  Motor: Tone is normal. Bulk is normal.  Mild drift present in LLE Sensory: Sensation is symmetric to light touch in the arms and legs. Cerebellar: Ataxia present in LUE/LLE, fine motor movements decreased in LUE.    Labs/Imaging/Neurodiagnostic studies   CBC: No results for input(s): "WBC", "NEUTROABS", "HGB", "HCT", "MCV", "PLT" in the last 168 hours. Basic Metabolic Panel:  Lab Results  Component Value Date   NA 140 02/07/2024   K 4.2 02/07/2024   CO2 20 02/07/2024   GLUCOSE 81 02/07/2024   BUN 9 02/07/2024   CREATININE 0.94 02/07/2024   CALCIUM  10.1 02/07/2024   GFRNONAA >60 01/23/2024   GFRAA >60 12/01/2017   Lipid Panel:  Lab Results  Component Value Date   LDLCALC 45 02/07/2024   HgbA1c:  Lab Results  Component Value Date   HGBA1C 4.9 01/07/2024   Urine Drug Screen:     Component Value Date/Time   LABOPIA NONE DETECTED 01/07/2024 1949   COCAINSCRNUR NONE DETECTED 01/07/2024 1949   LABBENZ NONE DETECTED 01/07/2024 1949   AMPHETMU NONE DETECTED 01/07/2024 1949   THCU NONE DETECTED 01/07/2024 1949   LABBARB NONE DETECTED 01/07/2024 1949    Alcohol Level     Component Value Date/Time   ETH <10 01/07/2024 1822   INR  Lab Results  Component Value Date   INR 1.0 01/07/2024   APTT  Lab Results  Component Value Date   APTT 28 01/07/2024   AED levels: No  results found for: "PHENYTOIN", "ZONISAMIDE", "LAMOTRIGINE", "LEVETIRACETA"  CT Head without contrast(Personally reviewed): No evidence of acute intracranial abnormality.  ASPECTS of 10. Prior infarct in the right internal capsule.  CT angio Head and Neck with contrast(Personally reviewed): No large vessel occlusion or significant stenosis in the head or neck. Small bilateral pleural effusions.  MRI Brain(Personally reviewed): PENDING  Previous Imaging:   CT Head 3/22:  No acute intracranial abnormality or significant interval change. Aspects is 10/10. Dense calcifications within the globus pallidus bilaterally.   CT Angio Head and Neck 3/22: Normal CTA of the neck. Normal CTA Circle of Willis without significant proximal stenosis, aneurysm, or branch vessel occlusion.  MRI Brain 3/22: 10 mm acute nonhemorrhagic infarct of the posterior limb of the right internal capsule. Susceptibility related calcifications in the globus pallidus bilaterally. This likely reflects the sequela of chronic microvascular ischemia.   ASSESSMENT   Faith Jordan is a 28 y.o. 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 CTH and CTA negative.   Impression: New Stroke versus Recrudescence of previous stroke symptoms secondary to complicated migraine  RECOMMENDATIONS   - Q2H NIH checks - STAT CT with any change in neuro status - MRI Brain   If positive, admit for full stroke workup.  ______________________________________________________________________    Signed, Audrene Lease, NP [facility]   NEUROHOSPITALIST ADDENDUM Performed a face to face diagnostic evaluation.   I have reviewed the contents of history and physical exam as documented by PA/ARNP/Resident and agree with above documentation.   I have discussed and formulated the above plan as documented. Edits to the note have been made as needed.  Nyari Olsson, MD  [facility] [phone]   If 7pm to 7am, please call on call as listed on AMION.

## 2024-02-24 NOTE — Code Documentation (Signed)
 Stroke Response Nurse Documentation Code Documentation  Faith Jordan is a 28 y.o. female arriving to [facility]  via [facility] on 02/24/2024 with past medical hx of Recent Stroke. On clopidogrel  75 mg daily. Code stroke was activated by EMS.   Patient from home where she was LKW at 1400 and now complaining of Lt leg weakness, ataxia and dysarthria.   Stroke team at the bedside on patient arrival. Labs drawn, CBG obtained. Patient to CT with team. NIHSS 3, see documentation for details and code stroke times. Patient with left leg weakness, left limb ataxia, and dysarthria  on exam. The following imaging was completed:  CT Head and CTA. Patient is not a candidate for IV Thrombolytic due to recent stroke. Patient is not a candidate for IR due to no LVO.   Care Plan: q 2 VS and NIHSS x 12 then q 4  Stroke swallow screen before any po's    Bedside handoff with ED RN Vicente Graham, Jule Nyhan  Stroke Response RN

## 2024-02-24 NOTE — ED Notes (Signed)
 Patient off unit in MRI at this time. Rounded on family in room, family denies need at this time.

## 2024-02-24 NOTE — ED Provider Notes (Signed)
 [facility] AT [facility] Provider Note   CSN: [id number] Arrival date & time: 02/24/24  1514     History  Chief Complaint  Patient presents with   Code Stroke    Faith Jordan is a 28 y.o. female.  HPI Patient is returned from CT scan.  Neurology has assessed CT angio and CT head.  At this time patient is not a candidate for thrombolytics.  Patient reports symptoms started very suddenly at 2 PM.  Abruptly her speech became slurred and left side became weak.  Patient reports she been feeling a little ill for the past couple of days.  She has had some nausea and fatigue.  She reports that she had  a mild headache and had tried some migraine medications the day before.  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  Patient was discharged from the hospital for 4/11\25 after CVA.  Patient has been compliant with medications and doing well since that time without intercurrent illness.    Home Medications Prior to Admission medications   Medication Sig Start Date End Date Taking? Authorizing Provider  acetaminophen  (TYLENOL ) 325 MG tablet Take 2 tablets (650 mg total) by mouth every 6 (six) hours as needed for mild pain (pain score 1-3), fever or headache. 01/12/24  Yes Gonfa, Ella Gun, MD  atorvastatin  (LIPITOR ) 40 MG tablet Take 1 tablet (40 mg total) by mouth daily for cholesterol. Patient taking differently: Take 40 mg by mouth in the morning. 02/21/24  Yes Clark, Katherine K, NP  Cholecalciferol (VITAMIN D3) 50 MCG (2000 UT) capsule Take 2,000 Units by mouth in the morning.   Yes [provider]  clopidogrel  (PLAVIX ) 75 MG tablet Take 1 tablet (75 mg total) by mouth daily. Patient taking differently: Take 75 mg by mouth in the morning. 02/08/24  Yes Clark, Katherine K, NP  Coenzyme Q10 (COQ-10 PO) Take 1 tablet by mouth in the morning.   Yes [provider]  FLUoxetine  (PROZAC ) 40 MG capsule Take 1 capsule  (40 mg total) by mouth daily. Patient taking differently: Take 40 mg by mouth in the morning. 02/23/24  Yes Mozingo, Regina Nattalie, NP  Galcanezumab -gnlm (EMGALITY ) 120 MG/ML SOAJ Inject 1 Dose into the skin every 30 (thirty) days.   Yes [provider]  LORazepam  (ATIVAN ) 1 MG tablet Take 1 tablet (1 mg total) by mouth every 6 (six) hours as needed for anxiety. 01/27/24  Yes Angiulli, Everlyn Hockey, PA-C  ondansetron  (ZOFRAN ) 8 MG tablet Take 8 mg by mouth as needed for nausea or vomiting.   Yes [provider]  Probiotic Product (ALIGN PO) Take 1 tablet by mouth as needed.   Yes [provider]  Rimegepant Sulfate  (NURTEC) 75 MG TBDP Take 1 tablet by mouth as needed.   Yes [provider]  topiramate  (TOPAMAX ) 50 MG tablet Take 1 tablet (50 mg total) by mouth daily. May also take 1 tablet (50 mg total) daily as needed (daily during menstrual cycle). 01/27/24  Yes Angiulli, Everlyn Hockey, PA-C  traZODone  (DESYREL ) 100 MG tablet Take 1 tablet (100 mg total) by mouth at bedtime. 02/23/24  Yes Mozingo, Regina Nattalie, NP  Ubrogepant  (UBRELVY  PO) Take 1 tablet by mouth as needed.   Yes [provider]  Ubrogepant  (UBRELVY ) 100 MG TABS Take 1 tablet by mouth as needed.   Yes [provider]  Zavegepant HCl (ZAVZPRET ) 10 MG/ACT SOLN Place 1 spray into the nose  daily as needed. 02/07/24  Yes Glory Larsen, MD  baclofen  (LIORESAL ) 10 MG tablet Take 1 tablet (10 mg total) by mouth 3 (three) times daily. Patient taking differently: Take 10 mg by mouth in the morning and at bedtime. 02/07/24  Yes Glory Larsen, MD  botulinum toxin Type A  (BOTOX ) 200 units injection Provider to inject 155 units into the muscles of the head and neck every 12 weeks. Discard remainder. Patient not taking: Reported on 02/24/2024 02/20/24   Glory Larsen, MD      Allergies    Advil [ibuprofen], Nsaids, and Sulfa antibiotics    Review of Systems   Review of Systems  Physical  Exam Updated Vital Signs BP 103/60 (BP Location: Right Arm)   Pulse 64   Temp 97.9 F (36.6 C) (Oral)   Resp 16   Ht 5\' 7"  (1.702 m)   Wt 72.2 kg   LMP 01/08/2024 (Exact Date)   SpO2 99%   BMI 24.93 kg/m  Physical Exam Constitutional:      Comments: Alert.  Clear mental status.  No respiratory distress.  Well-nourished well-developed.  HENT:     Head: Normocephalic and atraumatic.     Mouth/Throat:     Pharynx: Oropharynx is clear.  Eyes:     Extraocular Movements: Extraocular movements intact.     Pupils: Pupils are equal, round, and reactive to light.  Cardiovascular:     Rate and Rhythm: Normal rate and regular rhythm.  Pulmonary:     Effort: Pulmonary effort is normal.     Breath sounds: Normal breath sounds.  Abdominal:     General: There is no distension.     Palpations: Abdomen is soft.     Tenderness: There is no abdominal tenderness. There is no guarding.  Musculoskeletal:        General: No swelling or tenderness.     Right lower leg: No edema.     Left lower leg: No edema.  Skin:    General: Skin is warm and dry.  Neurological:     Comments: Patient is alert with intact cognitive function.  Speech is mild to moderately slurred but easily intelligible.  Very slight grip decreased left to right.  Patient has normal leg raise on the right and resistance.  Patient can spontaneously raise her on the left but has ataxia with heel shin maneuver.  Psychiatric:        Mood and Affect: Mood normal.     ED Results / Procedures / Treatments   Labs (all labs ordered are listed, but only abnormal results are displayed) Labs Reviewed  COMPREHENSIVE METABOLIC PANEL WITH GFR - Abnormal; Notable for the following components:      Result Value   CO2 20 (*)    Glucose, Bld 129 (*)    All other components within normal limits  I-STAT CHEM 8, ED - Abnormal; Notable for the following components:   Glucose, Bld 122 (*)    Calcium , Ion 1.07 (*)    TCO2 19 (*)    All other  components within normal limits  CBG MONITORING, ED - Abnormal; Notable for the following components:   Glucose-Capillary 102 (*)    All other components within normal limits  CBG MONITORING, ED - Abnormal; Notable for the following components:   Glucose-Capillary 127 (*)    All other components within normal limits  PROTIME-INR  APTT  CBC  DIFFERENTIAL  ETHANOL  HCG, SERUM, QUALITATIVE    EKG EKG  Interpretation Date/Time:  Friday Feb 24 2024 15:47:44 EDT Ventricular Rate:  70 PR Interval:  126 QRS Duration:  94 QT Interval:  399 QTC Calculation: 431 R Axis:   63  Text Interpretation: Sinus rhythm RSR' in V1 or V2, right VCD or RVH no sig change from previous Confirmed by Wynetta Heckle [phone]) on 02/24/2024 9:48:27 PM  Radiology MR BRAIN WO CONTRAST Result Date: 02/24/2024 CLINICAL DATA:  Initial evaluation for acute neuro deficit, stroke suspected. EXAM: MRI HEAD WITHOUT CONTRAST TECHNIQUE: Multiplanar, multiecho pulse sequences of the brain and surrounding structures were obtained without intravenous contrast. COMPARISON:  Comparison made with CTs from earlier the same day as well as prior studies from 01/07/2024. FINDINGS: Brain: Cerebral volume within normal limits. There has been normal expected interval evolution of previously identified ischemic infarct involving right internal capsule. Infarct is now late subacute to chronic in appearance with no more than mild residual diffusion signal abnormality. No other evidence for acute or subacute ischemia. No acute or chronic intracranial blood products. Susceptibility artifact related to calcifications at the globus palladi again noted. No mass lesion, midline shift or mass effect. No hydrocephalus or extra-axial fluid collection. Pituitary gland within normal limits. Vascular: Major intracranial vascular flow voids are maintained. Skull and upper cervical spine: Craniocervical junction within normal limits. Bone marrow signal intensity  normal. No scalp soft tissue abnormality. Sinuses/Orbits: Globes and orbital soft tissues within normal limits. Paranasal sinuses are clear. No mastoid effusion. Other: None. IMPRESSION: 1. No acute intracranial abnormality. 2. Normal expected interval evolution of previously identified ischemic infarct involving the right internal capsule, now late subacute to chronic in appearance. 3. Otherwise stable and normal brain MRI. Electronically Signed   By: Virgia Griffins M.D.   On: 02/24/2024 20:44   CT ANGIO HEAD NECK W WO CM (CODE STROKE) Result Date: 02/24/2024 CLINICAL DATA:  Neuro deficit, acute, stroke suspected. Left-sided weakness. History of stroke. EXAM: CT ANGIOGRAPHY HEAD AND NECK WITH AND WITHOUT CONTRAST TECHNIQUE: Multidetector CT imaging of the head and neck was performed using the standard protocol during bolus administration of intravenous contrast. Multiplanar CT image reconstructions and MIPs were obtained to evaluate the vascular anatomy. Carotid stenosis measurements (when applicable) are obtained utilizing NASCET criteria, using the distal internal carotid diameter as the denominator. RADIATION DOSE REDUCTION: This exam was performed according to the departmental dose-optimization program which includes automated exposure control, adjustment of the mA and/or kV according to patient size and/or use of iterative reconstruction technique. CONTRAST:  75mL OMNIPAQUE  IOHEXOL  350 MG/ML SOLN COMPARISON:  CTA head and neck 01/07/2024 FINDINGS: CTA NECK FINDINGS Aortic arch: Standard branching. Widely patent arch vessel origins allowing for streak and motion artifact. Right carotid system: Patent and smooth without evidence of stenosis or dissection. Left carotid system: Patent and smooth without evidence of stenosis or dissection. Vertebral arteries: Patent, codominant, and smooth without evidence of a significant stenosis or dissection, although streak artifact partially obscures the mid to distal  left V1 segment. Skeleton: No acute osseous abnormality or suspicious lesion. Other neck: No evidence of cervical lymphadenopathy or mass. Upper chest: Partially visualized small bilateral pleural effusions. Review of the MIP images confirms the above findings CTA HEAD FINDINGS Anterior circulation: The internal carotid arteries are widely patent from skull base to carotid termini. ACAs and MCAs are patent without evidence of a proximal branch occlusion or significant proximal stenosis. No aneurysm is identified. Posterior circulation: The intracranial vertebral arteries are widely patent to the basilar. Patent bilateral PICA, left AICA,  and bilateral SCA origins are visualized. The basilar artery is widely patent. There are small posterior communicating arteries bilaterally. Both PCAs are patent without evidence of a significant proximal stenosis. No aneurysm is identified. Venous sinuses: Patent. Anatomic variants: None. Review of the MIP images confirms the above findings These results were communicated to Dr. Murvin Arthurs at 3:33 pm on 02/24/2024 by text page via the [facility] messaging system. IMPRESSION: 1. No large vessel occlusion or significant stenosis in the head or neck. 2. Small bilateral pleural effusions. Electronically Signed   By: Aundra Lee M.D.   On: 02/24/2024 15:42   CT HEAD CODE STROKE WO CONTRAST Result Date: 02/24/2024 CLINICAL DATA:  Code stroke. Neuro deficit, acute, stroke suspected. Left-sided weakness. EXAM: CT HEAD WITHOUT CONTRAST TECHNIQUE: Contiguous axial images were obtained from the base of the skull through the vertex without intravenous contrast. RADIATION DOSE REDUCTION: This exam was performed according to the departmental dose-optimization program which includes automated exposure control, adjustment of the mA and/or kV according to patient size and/or use of iterative reconstruction technique. COMPARISON:  Head CT and MRI 01/07/2024 FINDINGS: Brain: An 8 mm focus of residual  hypodensity is present in the posterior limb of the right internal capsule corresponding to the acute infarct on the prior MRI. No acute infarct, intracranial hemorrhage, mass, midline shift, or extra-axial fluid collection is identified. Prominent bilateral globus pallidus calcification is again noted. Cerebral volume is normal. The ventricles are normal in size. Vascular: No hyperdense vessel. Skull: No fracture or suspicious lesion. Sinuses/Orbits: Visualized paranasal sinuses and mastoid air cells are well aerated. Unremarkable orbits. Other: None. ASPECTS ([place] Stroke Program Early CT Score) - Ganglionic level infarction (caudate, lentiform nuclei, internal capsule, insula, M1-M3 cortex): 7 - Supraganglionic infarction (M4-M6 cortex): 3 Total score (0-10 with 10 being normal): 10 These results were communicated to Dr. Murvin Arthurs at 3:33 pm on 02/24/2024 by text page via the [facility] messaging system. IMPRESSION: 1. No evidence of acute intracranial abnormality. ASPECTS of 10. 2. Prior infarct in the right internal capsule. Electronically Signed   By: Aundra Lee M.D.   On: 02/24/2024 15:35    Procedures Procedures   CRITICAL CARE Performed by: Wynetta Heckle   Total critical care time: 30 minutes  Critical care time was exclusive of separately billable procedures and treating other patients.  Critical care was necessary to treat or prevent imminent or life-threatening deterioration.  Critical care was time spent personally by me on the following activities: development of treatment plan with patient and/or surrogate as well as nursing, discussions with consultants, evaluation of patient's response to treatment, examination of patient, obtaining history from patient or surrogate, ordering and performing treatments and interventions, ordering and review of laboratory studies, ordering and review of radiographic studies, pulse oximetry and re-evaluation of patient's condition.  Medications Ordered  in ED Medications  sodium chloride  flush (NS) 0.9 % injection 3 mL (3 mLs Intravenous Given 02/24/24 1628)  iohexol  (OMNIPAQUE ) 350 MG/ML injection 75 mL (75 mLs Intravenous Contrast Given 02/24/24 1534)  0.9 %  sodium chloride  infusion ( Intravenous New Bag/Given 02/24/24 2112)  metoCLOPramide  (REGLAN ) tablet 10 mg (10 mg Oral Given 02/24/24 2112)  ketorolac  (TORADOL ) 30 MG/ML injection 15 mg (15 mg Intravenous Given 02/24/24 2114)    ED Course/ Medical Decision Making/ A&P                                 Medical Decision Making  Amount and/or Complexity of Data Reviewed Labs: ordered. Radiology: ordered.  Risk Prescription drug management.   Patient has prior history of confirmed stroke.  She experienced sudden onset of symptoms today.  Patient arrived as code stroke and was immediately evaluated by stroke team with Dr. Murvin Arthurs.  Patient is not candidate for thrombolytics due to recent stroke.  At this time recommendation is to proceed with MRI.  Patient is currently alert with clear mental status.  Airway is stable.  Continue to monitor closely.  20: 51 awaiting results from MRI.  Patient reports she has a pressure headache on her forehead and temples.  She would like some medication for this.  Patient reports that she has tolerated Toradol  well in the past.  She denies having allergy to it.  She reports she only has adverse reaction to Motrin but takes Aleve  and has had Toradol .  Patient reports that she has tolerated Reglan  in the past.  She feels that an oral dose of Reglan  would be good.  Patient think she has had adverse reaction to Benadryl  giving her akathisia anxiety type symptoms.  Will hold off on any Benadryl  at this time.  MRI interpreted by radiology no acute stroke.  Old stroke territory seen with normal anticipated evolution.  Patient has taken Toradol  and Reglan .  She feels improved.  Patient reports she has been up and ambulatory to the bathroom.  She feels steady with her  gait.  At this time we discussed continuing all of her regular medications and continued close work with her neurologist.  Patient is advised she will need to come back for recheck if she gets hemiplegic symptoms until there is clear delineation between her migraines and possible stroke, as the most recent stroke was last month.  Patient voices understanding.        Final Clinical Impression(s) / ED Diagnoses Final diagnoses:  Slurred speech  Left-sided weakness  History of CVA (cerebrovascular accident)  Complicated migraine    Rx / DC Orders ED Discharge Orders     None         Wynetta Heckle, MD 02/24/24 2153

## 2024-02-24 NOTE — ED Provider Notes (Signed)
 Patient taken directly to CT.  Not examined by myself at the bridge.  Not arrived back to the room at this time 15: 30   Wynetta Heckle, MD 02/24/24 1530

## 2024-02-24 NOTE — ED Notes (Signed)
 Second call placed to CCMD

## 2024-02-24 NOTE — ED Notes (Signed)
 CCMD called.

## 2024-02-28 ENCOUNTER — Ambulatory Visit: Admitting: Occupational Therapy

## 2024-02-28 ENCOUNTER — Encounter: Payer: Self-pay | Admitting: Speech Pathology

## 2024-02-28 ENCOUNTER — Ambulatory Visit: Admitting: Speech Pathology

## 2024-02-28 ENCOUNTER — Ambulatory Visit

## 2024-02-28 ENCOUNTER — Ambulatory Visit: Admitting: Physical Therapy

## 2024-02-28 ENCOUNTER — Other Ambulatory Visit: Payer: Self-pay | Admitting: Neurology

## 2024-02-28 VITALS — BP 116/78 | HR 69

## 2024-02-28 DIAGNOSIS — R29818 Other symptoms and signs involving the nervous system: Secondary | ICD-10-CM

## 2024-02-28 DIAGNOSIS — M6281 Muscle weakness (generalized): Secondary | ICD-10-CM | POA: Diagnosis not present

## 2024-02-28 DIAGNOSIS — R29898 Other symptoms and signs involving the musculoskeletal system: Secondary | ICD-10-CM

## 2024-02-28 DIAGNOSIS — R278 Other lack of coordination: Secondary | ICD-10-CM

## 2024-02-28 DIAGNOSIS — R2681 Unsteadiness on feet: Secondary | ICD-10-CM

## 2024-02-28 DIAGNOSIS — I639 Cerebral infarction, unspecified: Secondary | ICD-10-CM

## 2024-02-28 DIAGNOSIS — I69354 Hemiplegia and hemiparesis following cerebral infarction affecting left non-dominant side: Secondary | ICD-10-CM

## 2024-02-28 DIAGNOSIS — R2689 Other abnormalities of gait and mobility: Secondary | ICD-10-CM

## 2024-02-28 DIAGNOSIS — R471 Dysarthria and anarthria: Secondary | ICD-10-CM

## 2024-02-28 DIAGNOSIS — R41841 Cognitive communication deficit: Secondary | ICD-10-CM

## 2024-02-28 NOTE — Therapy (Addendum)
 OUTPATIENT OCCUPATIONAL THERAPY NEURO TREATMENT  Patient Name: Faith Jordan MRN: [id number] DOB:December 13, 1995, 28 y.o., female Today's Date: 02/28/2024  PCP: Gabriel John, NP  REFERRING PROVIDER: Genetta Kenning, MD   END OF SESSION:  OT End of Session - 02/28/24 1100     Visit Number 7    Number of Visits 13   including eval   Date for OT Re-Evaluation 04/06/24    Authorization Type [employer] ([place]) 2025    OT Start Time 1018    OT Stop Time 1057    OT Time Calculation (min) 39 min    Activity Tolerance Patient tolerated treatment well    Behavior During Therapy [facility] for tasks assessed/performed               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   Acute flank pain 06/02/2022   Angio-edema 04/27/2016   Angioedema    Aquagenic angio-edema-urticaria    Asthma    no problems /not used recently   Dysautonomia (HCC)    Dysrhythmia    sinus tachycardia   Fibroid    right breast, adenoma   Headache(784.0)    History of COVID-19 11/21/2020   Hives 12/22/2015   Pneumonia    hx  6th grade   Sensation of fullness in both ears 02/02/2023   Snoring 02/11/2022   Urinary frequency 02/12/2019   Vaginal yeast infection 06/02/2022   Vasculitis (HCC)    Past Surgical History:  Procedure Laterality Date   ADENOIDECTOMY     BREAST LUMPECTOMY Right    BREAST LUMPECTOMY Right    MASS EXCISION Right 10/07/2014   Procedure: EXCISION OF RIGHT BREAST MASS;  Surgeon: Adalberto Hollow, MD;  Location: [facility] OR;  Service: General;  Laterality: Right;   TONSILLECTOMY AND ADENOIDECTOMY     TOOTH EXTRACTION     TRANSESOPHAGEAL ECHOCARDIOGRAM (CATH LAB) N/A 01/10/2024   Procedure: TRANSESOPHAGEAL ECHOCARDIOGRAM;  Surgeon: Hugh Madura, MD;  Location: [facility] INVASIVE CV LAB;  Service: Cardiovascular;  Laterality: N/A;   Patient Active Problem List   Diagnosis Date Noted   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) 02/10/2024   Coping style affecting medical condition 01/19/2024   History of CVA (cerebrovascular accident) 01/12/2024   Stroke of right basal ganglia (HCC) 01/07/2024   Non-restorative sleep 05/26/2022   Sleep paralysis, 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   Vivid dream 05/26/2022   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   Excessive daytime sleepiness 05/26/2022   Sleep disturbance 03/17/2022   Panic attacks 02/11/2022   Vitamin B12 deficiency 07/30/2021   Vitamin D  deficiency 07/30/2021   Family history of hypothyroidism 07/30/2021   GAD (generalized anxiety disorder) 03/18/2020   Fatigue 11/06/2019   Chronic back pain 07/06/2019   Preventative health care 04/27/2018   Migraines 04/25/2018   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   Anemia 01/04/2018   Headache disorder 12/29/2015   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   Sinus tachycardia 12/22/2015   Dysautonomia (HCC) 12/22/2015   Dysautonomia, familial (HCC) 04/22/2015   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   Neurocardiogenic syncope 04/06/2013   Intermittent palpitations 04/06/2013    ONSET DATE: 01/26/2024 (referral date)  REFERRING DIAG: I63.81 (ICD-10-CM) - Other cerebral infarction due to occlusion or stenosis of small artery   THERAPY DIAG:  Other symptoms and signs involving the musculoskeletal system  Other lack of coordination  Hemiplegia and hemiparesis following cerebral infarction affecting left non-dominant  side (HCC)  Other symptoms and signs involving the nervous system  Muscle weakness (generalized)  Rationale for Evaluation and Treatment: Rehabilitation  SUBJECTIVE:   SUBJECTIVE STATEMENT: Pronunciation: "Bree-Anne-ah"  Pt reported accidentally missing SLP appointment today d/t had appointment wrong in calendar. Pt reported not feeling well lately, not eating as much. Pt reported drinking water. Pt reported eating some things, e.g. chicken and hash browns.   Pt  reported recent ED visit. Pt reported feeling nauseous and tired along with facial droop and difficulty with speech and difficulty with walking and therefore went to ED. Pt reported that doctors think that symptoms could be either hemiplegic migraine or post-stroke recrudescence. No new findings on imaging.   Pt accompanied by: self (pt drove self)  PERTINENT HISTORY: familiar hemiplegic migraine, angioedema 2017,dysautonomia; infarct right internal capsule/ R basal ganglia stroke   MRI of brain on 01/07/24   IMPRESSION: 1. 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 2. Susceptibility related calcifications in the globus pallidus bilaterally. This likely reflects the sequela of chronic microvascular ischemia.  PRECAUTIONS: Fall, Per 02/10/24 MD Office Visit: Pt may gradually begin to return to driving.   WEIGHT BEARING RESTRICTIONS: No  PAIN:  Are you having pain? No  FALLS: Has patient fallen in last 6 months? Yes when CVA event occurred though none since then   LIVING ENVIRONMENT: Lives with: lives with their family Lives in: House/apartment Stairs: Yes: External: 4 steps; bilateral but cannot reach both Has following equipment at home: shower chair  PLOF: Independent, typically living ind, worked as Engineer, civil (consulting)  PATIENT GOALS: "I want to get my hand and arm back and be able to go to work."  OBJECTIVE:  Note: Objective measures were completed at Evaluation unless otherwise noted.  HAND DOMINANCE: Right  ADLs: Overall ADLs:  Transfers/ambulation related to ADLs: Eating: ind Grooming: difficulty with completing hair styling e.g. putting hair up in ponytail, difficulty with curling hair. Uses RUE to complete toothbrushing and brushing hair.  Difficulty with clipping fingernails. UB Dressing: ind, extra time. Buttons and zippers difficult.  LB Dressing: Ind, difficulty with pulling up pants and buttoning buttons on jeans, extra time for  tying shoes Toileting: ind  Bathing: difficulty washing hair d/t unable to apply pressure while moving LUE, typically uses RUE to ensure thoroughness, shower bench is available PRN Tub Shower transfers: walk-in shower, pt reported SOB after showering or when walking a lot, sometimes requires rest breaks Equipment: Shower seat with back and Walk in shower  IADLs: Shopping: assistance from parents but pt participates, such as trying to reach for items on shelf and pushing cart Light housekeeping: pt completes dishwashing, has not attempted laundry Meal Prep: pt reported cooking but task is tiring Community mobility: has attempted to drive 1x with parent  Handwriting: No change per pt.   Work activities: Pt expressed concerns with FM coordination and concerns about return to work considerations, such as coordination required for drawing up syringes from a vial for medication administration, completing IVs, typing, navigating phone.   MOBILITY STATUS: Independent  POSTURE COMMENTS:  Ind sitting balance, sometimes forward shoulder posture  ACTIVITY TOLERANCE: Activity tolerance: Pt reported "I'm doing more than I was doing in the hospital, but I definitely get tired easier and take more breaks."  FUNCTIONAL OUTCOME MEASURES: PSF: 3.0    Total score = sum of the activity scores/number of activities Minimum detectable change (90%CI) for average score = 2 points Minimum detectable change (90%CI) for  single activity score = 3 points   UPPER EXTREMITY ROM:    Active ROM Right eval Left eval  Shoulder flexion [facility]  Shoulder abduction  [facility]  Shoulder adduction    Shoulder extension    Shoulder internal rotation    Shoulder external rotation    Elbow flexion  WFL - slightly delayed movements  Elbow extension    Wrist flexion    Wrist extension    Wrist ulnar deviation    Wrist radial deviation    Wrist pronation    Wrist supination    (Blank rows = not tested)  OT palpated  pt's L shoulder and noted "tightness" of muscles at L shoulder.   UPPER EXTREMITY MMT:     MMT Right eval Left eval  Shoulder flexion 5 3+   HAND FUNCTION: Grip strength: Right: 48.9, 53.5, 53.3 (51.9 lbs average) lbs; Left: 24.2, 22.2, 22.7 (23.1 lbs average) lbs  COORDINATION: 9 Hole Peg test: Right: 20 sec; Left: 40 sec  Pt reported practicing picking up pills, sorting change, and playing connect-4 at home.  SENSATION: Pt reported tingling in L thumb which has since resolves. Pt questioned if symptoms might have been from resting hand splint at night. Pt reported no other symptoms of numbness/tingling.  EDEMA: None noted  MUSCLE TONE: RUE: Within functional limits and LUE: slight ataxia  COGNITION: Overall cognitive status: Within functional limits for tasks assessed  Pt reported some difficulty with word-finding.  Pt reported sometimes asking same question again "a few hours apart." Pt's mother reported not noticing this recently.  VISION: Subjective report: Pt reported initial changes to vision (unable to read, blurry vision close-up and far away) though "okay now."  Baseline vision: No visual deficits Visual history: none noted  VISION ASSESSMENT: Pt accurately clock on wall and reported reading books on Kindle. Pt reported still using large print on phone since hospital.   Pt tracked easily to all 4 quadrants. Peripheral vision - WFL B sides, ?slightly decreased on L side though functional regardless  Patient has difficulty with following activities due to following visual impairments: None noted  PERCEPTION: Not tested  PRAXIS: Not tested  OBSERVATIONS: Pt ambulated ind. Pt soft-spoken and appeared tired during session. Pt was pleasant and appeared motivated to complete LUE functional tasks at home based on pt report.                                                                                                                             TREATMENT DATE:     Self-Care Pt tearful about missing SLP appointment today, reported feeling more forgetful lately. OT provided therapeutic listening and verbal reassurance and education regarding prognosis, healing process, and dx. Pt acknowledged understanding. OT assisted with rescheduling SLP evaluation along with front desk and pt demo'd increased level of calm afterwards.  OT educated pt on options for Functional activities to practice thumb ROM at home and driving safety. Pt verbalized understanding.  TherAct Pt reported improved ability to wash hair though reported thumb sometimes feeling "stuck." To improve in-hand manipulation and thumb ROM:  Placing small pegs with in-hand manipulation (x2-3 pegs at a time) - to improve LUE FM coordination and dexterity as needed for functional tasks, to improve in-hand manipulation. Pt demo'd improved efficiency as task progressed following therapist modeling and education to complete action with hand supinated to avoid fighting gravity.   Velcro rolling cylinders (various sizes/shapes) on Velcro board - 3 to 5 reps each type of cylinder- to improve FM coordination, strengthening, and functional pinch of affected UE.   In-hand manipulation of shifting fingers/thumb of LUE on Hula Hoop (vertical orientation), standing - to improve in-hand manipulation, to improve thumb AROM.   Neuro Re-Ed Zoom ball - 2 sets seated, 2 sets standing - to improve affected UE ROM, strengthening, endurance, gross motor coordination, and proprioception.   PATIENT EDUCATION: Education details: see today's tx above Person educated: Patient Education method: Explanation, Demonstration, and Handouts Education comprehension: verbalized understanding  HOME EXERCISE PROGRAM: 02/09/24 - Affected UE ROM and Yellow Theraputty. Access Code: [email or web address]. 02/15/2024: L handed typing words 02/21/24 - Digit ext with theraputty or rubber band (access code: [email or web address]). Functional reach: Handwritten/Verbal  instructions: 1 set in front of mirror, 1 set without mirror: Reaching overhead to touch top of head with affected hand, reaching overhead to touch back of head with affected hand, reaching arm forward and touching nose with affected UE - 10 reps, 2 sets, 2x per day.  GOALS: Goals reviewed with patient? Yes  SHORT TERM GOALS: Target date: 03/09/24  Pt will ind demo understanding of HEP using visual handouts. Baseline: new to outpt OT Goal status: in progress  2.  Pt will ind recall at least 3 energy conservation strategies. Baseline: Activity tolerance: Pt reported "I'm doing more than I was doing in the hospital, but I definitely get tired easier and take more breaks."  Goal status: in progress  3.  Pt and/or family will return demo of personal e-stim unit use and electrode placement for affected UE.  Baseline: pt reported she has ordered a personal e-stim unit.  02/09/24 - Education and handout provided. Pt returned demo of using personal e-stim unit for wrist flex/ext of affected UE. Goal status: MET  4.  Pt will report improved ability to use LUE during overhead reaching tasks with minimal use of RUE to compensate, such as hair care tasks and washing hair. Baseline: pt reported difficulty with hair styling (e.g. putting hair in ponytail, curling hair) Goal status: in progress  LONG TERM GOALS: Target date: 04/06/24  Patient will report at least two-point increase in average PSFS score or at least three-point increase in a single activity score indicating functionally significant improvement given minimum detectable change using adaptive strategies and A/E PRN. Baseline: PSF: 3.0 total score (See above for individual activity scores)  Goal status: in progress  2.  Patient will demonstrate at least 30 lbs LUE grip strength as needed to open jars and other containers.  Baseline: Grip strength: Right: 48.9, 53.5, 53.3 (51.9 lbs average) lbs; Left: 24.2, 22.2, 22.7 (23.1 lbs average)  lbs Goal status: in progress  3.  Patient will demo improved FM coordination as evidenced by completing nine-hole peg with use of LUE in 30 seconds or less.  Baseline: 9 Hole Peg test: Right: 20 sec; Left: 40 sec Goal status: in progress  4.  Pt will verbalize understanding of A/E, adaptive strategies for functional tasks as needed  for return to work considerations. Baseline: Work activities: Pt expressed concerns with FM coordination and concerns about return to work considerations, such as coordination required for drawing up syringes from a vial for medication administration, completing IVs, typing, navigating phone.  Goal status: in progress  ASSESSMENT:  CLINICAL IMPRESSION: Pt tolerated tasks well. Pt continuing to progress towards goals. Pt benefits from verbal reassurance during tasks to improve participation. Pt acknowledged understanding of all education today. Pt would benefit from skilled OT services to address deficits, increase overall independence, and return patient to PLOF as able.   PERFORMANCE DEFICITS: in functional skills including ADLs, IADLs, coordination, dexterity, proprioception, sensation, tone, ROM, strength, pain, muscle spasms, flexibility, Fine motor control, Gross motor control, mobility, balance, endurance, and UE functional use, cognitive skills including attention and energy/drive, and psychosocial skills including environmental adaptation.   IMPAIRMENTS: are limiting patient from ADLs, IADLs, rest and sleep, work, leisure, and social participation.   CO-MORBIDITIES: may have co-morbidities  that affects occupational performance. Patient will benefit from skilled OT to address above impairments and improve overall function.  REHAB POTENTIAL: Good  PLAN:  OT FREQUENCY: 2x/week  OT DURATION: 6 weeks (dates extended to allow for scheduling)  PLANNED INTERVENTIONS: 97168 OT Re-evaluation, 97535 self care/ADL training, 86578 therapeutic exercise, 97530  therapeutic activity, 97112 neuromuscular re-education, 97140 manual therapy, 97035 ultrasound, 97018 paraffin, 46962 fluidotherapy, 97010 moist heat, 97010 cryotherapy, 97760 Orthotic Initial, 97761 Prosthetic Initial, 97763 Orthotic/Prosthetic subsequent, passive range of motion, functional mobility training, visual/perceptual remediation/compensation, energy conservation, patient/family education, and DME and/or AE instructions  RECOMMENDED OTHER SERVICES: PT eval completed, SLP eval scheduled  CONSULTED AND AGREED WITH PLAN OF CARE: Patient and family member  PLAN FOR NEXT SESSION:  Functional use of LUE: e.g. clipping nails, putting hair in ponytail, grooming, navigating phone, typing, see PSFS items  Review ROM and theraputty HEP PRN FM coordination and functional reaching activities - e.g. Blaze Pods, small pegs in-hand manipulation, thumb ROM Overhead strengthening per pt request  Oakley Bellman, OT 02/28/2024, 11:04 AM

## 2024-02-28 NOTE — Therapy (Signed)
 OUTPATIENT PHYSICAL THERAPY NEURO TREATMENT   Patient Name: Faith Jordan MRN: [id number] DOB:August 08, 1996, 28 y.o., female Today's Date: 02/28/2024   PCP: Gabriel John, NP REFERRING PROVIDER: Genetta Kenning, MD  END OF SESSION:  PT End of Session - 02/28/24 1106     Visit Number 8    Number of Visits 13    Date for PT Re-Evaluation 03/20/24    Authorization Type [employer]    PT Start Time 1105   from OT session   PT Stop Time 1145    PT Time Calculation (min) 40 min    Equipment Utilized During Treatment Gait belt    Activity Tolerance Patient tolerated treatment well    Behavior During Therapy [facility] for tasks assessed/performed                  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   Acute flank pain 06/02/2022   Angio-edema 04/27/2016   Angioedema    Aquagenic angio-edema-urticaria    Asthma    no problems /not used recently   Dysautonomia (HCC)    Dysrhythmia    sinus tachycardia   Fibroid    right breast, adenoma   Headache(784.0)    History of COVID-19 11/21/2020   Hives 12/22/2015   Pneumonia    hx  6th grade   Sensation of fullness in both ears 02/02/2023   Snoring 02/11/2022   Urinary frequency 02/12/2019   Vaginal yeast infection 06/02/2022   Vasculitis (HCC)    Past Surgical History:  Procedure Laterality Date   ADENOIDECTOMY     BREAST LUMPECTOMY Right    BREAST LUMPECTOMY Right    MASS EXCISION Right 10/07/2014   Procedure: EXCISION OF RIGHT BREAST MASS;  Surgeon: Adalberto Hollow, MD;  Location: [facility] OR;  Service: General;  Laterality: Right;   TONSILLECTOMY AND ADENOIDECTOMY     TOOTH EXTRACTION     TRANSESOPHAGEAL ECHOCARDIOGRAM (CATH LAB) N/A 01/10/2024   Procedure: TRANSESOPHAGEAL ECHOCARDIOGRAM;  Surgeon: Hugh Madura, MD;  Location: [facility] INVASIVE CV LAB;  Service: Cardiovascular;  Laterality: N/A;   Patient Active Problem List   Diagnosis Date Noted   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) 02/10/2024   Coping style affecting medical condition 01/19/2024   History of CVA (cerebrovascular accident) 01/12/2024   Stroke of right basal ganglia (HCC) 01/07/2024   Non-restorative sleep 05/26/2022   Sleep paralysis, 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   Vivid dream 05/26/2022   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   Excessive daytime sleepiness 05/26/2022   Sleep disturbance 03/17/2022   Panic attacks 02/11/2022   Vitamin B12 deficiency 07/30/2021   Vitamin D  deficiency 07/30/2021   Family history of hypothyroidism 07/30/2021   GAD (generalized anxiety disorder) 03/18/2020   Fatigue 11/06/2019   Chronic back pain 07/06/2019   Preventative health care 04/27/2018   Migraines 04/25/2018   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   Anemia 01/04/2018   Headache disorder 12/29/2015   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   Sinus tachycardia 12/22/2015   Dysautonomia (HCC) 12/22/2015   Dysautonomia, familial (HCC) 04/22/2015   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   Neurocardiogenic syncope 04/06/2013   Intermittent palpitations 04/06/2013    ONSET DATE: 01/26/2024 (referral)   REFERRING DIAG:  I63.81 (ICD-10-CM) - Other cerebral infarction due to occlusion or stenosis of small artery    THERAPY DIAG:  Muscle weakness (generalized)  Other abnormalities of gait and  mobility  Unsteadiness on feet  Other symptoms and signs involving the musculoskeletal system  Other symptoms and signs involving the nervous system  Rationale for Evaluation and Treatment: Rehabilitation  SUBJECTIVE:                                                                                                                                                                                             SUBJECTIVE STATEMENT: Pt received from OT session. Pt reports that since last visit she did go to the hospital for return of L-sided  weakness and stroke-like symptoms. Pt reports that imaging ruled out another stroke and it is suspected her symptoms were a result of either a hemiplegic migraine OR post stroke recrudescence (PSR) that can be triggered by stress or illness. She reports that after getting home from the hospital she slept for 2 days and she felt like her walking was better after she woke up. Pt also reports that she has been having nausea for 4-5 days and is having difficulty eating.  No falls or other acute changes. Pt did the elliptical x 9 min yesterday and 20 squats on squat machine.  Pt accompanied by: Self   PERTINENT HISTORY: familiar hemiplegic migraine, angioedema 2017,dysautonomia; infarct right internal capsule/ R basal ganglia stroke  PAIN:  Are you having pain? No    PRECAUTIONS: Fall  RED FLAGS: None   WEIGHT BEARING RESTRICTIONS: No  FALLS: Has patient fallen in last 6 months? No  LIVING ENVIRONMENT: Lives with: lives with their family Lives in: House/apartment Stairs: Yes: External: 4 steps; bilateral but cannot reach both Has following equipment at home: shower chair  PLOF: Independent  PATIENT GOALS: "Being normal again. Being able to go back to work and be independent"   OBJECTIVE:  Note: Objective measures were completed at Evaluation unless otherwise noted.  DIAGNOSTIC FINDINGS: MRI of brain on 01/07/24  IMPRESSION: 1. 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 2. Susceptibility related calcifications in the globus pallidus bilaterally. This likely reflects the sequela of chronic microvascular ischemia.  COGNITION: Overall cognitive status: Within functional limits for tasks assessed   SENSATION: Pt denies numbness/tingling in L hemibody   MUSCLE TONE: Non-fatiguing clonus of LLE  POSTURE: No Significant postural limitations  LOWER EXTREMITY ROM:     Active  Right Eval Left Eval  Hip flexion    Hip extension     Hip abduction    Hip adduction    Hip internal rotation    Hip external rotation    Knee flexion    Knee extension    Ankle  dorsiflexion    Ankle plantarflexion    Ankle inversion    Ankle eversion     (Blank rows = not tested)  LOWER EXTREMITY MMT:  Tested in seated position   MMT Right Eval Left Eval  Hip flexion 5 4+  Hip extension    Hip abduction 5 5  Hip adduction 5 5  Hip internal rotation    Hip external rotation    Knee flexion 5 5  Knee extension 5 4+  Ankle dorsiflexion 5 5  Ankle plantarflexion    Ankle inversion    Ankle eversion    (Blank rows = not tested)  BED MOBILITY:  Not tested  TRANSFERS: Sit to stand: Complete Independence  Assistive device utilized: None     Stand to sit: Complete Independence  Assistive device utilized: None      RAMP:  Not tested  CURB:  Not tested  GAIT: Gait pattern: step through pattern, decreased arm swing- Left, decreased step length- Right, decreased stance time- Left, decreased stride length, decreased hip/knee flexion- Left, lateral hip instability, and wide BOS Distance walked: Various clinic distances  Assistive device utilized: None Level of assistance: Modified independence Comments: Pt walks very guarded, as if she feels unstable. No instability noted other than single instance of L knee buckling which pt able to self-correct.    PATIENT SURVEYS:  ABC scale to be obtained                                                                                                                               TREATMENT:    Self-Care Vitals:   02/28/24 1112  BP: 116/78  Pulse: 69   Seated BP assessed in LUE, vitals WNL. Also encouraged pt to reach out to her PCP regarding ongoing nausea and difficulty eating, she declines at this time as she is not sure much can be done about it.  Ther Act  For STG assessment: ABC: 70.6% HiMat: 11/54 (see below) Pt able to retrieve a 25# KB from the ground via squatting  exhibiting good lifting technique   [facility] PT Assessment - 02/28/24 1120       Ambulation/Gait   Gait velocity 32.8 ft over 9.9 sec = 3.31 ft/sec      Functional Gait  Assessment   Gait assessed  Yes    Gait Level Surface Walks 20 ft in less than 7 sec but greater than 5.5 sec, uses assistive device, slower speed, mild gait deviations, or deviates 6-10 in outside of the 12 in walkway width.    Change in Gait Speed Able to smoothly change walking speed without loss of balance or gait deviation. Deviate no more than 6 in outside of the 12 in walkway width.    Gait with Horizontal Head Turns Performs head turns smoothly with no change in gait. Deviates no more than 6 in outside 12 in walkway width    Gait with Vertical Head Turns  Performs head turns with no change in gait. Deviates no more than 6 in outside 12 in walkway width.    Gait and Pivot Turn Pivot turns safely within 3 sec and stops quickly with no loss of balance.    Step Over Obstacle Is able to step over one shoe box (4.5 in total height) without changing gait speed. No evidence of imbalance.    Gait with Narrow Base of Support Ambulates 7-9 steps.    Gait with Eyes Closed Walks 20 ft, uses assistive device, slower speed, mild gait deviations, deviates 6-10 in outside 12 in walkway width. Ambulates 20 ft in less than 9 sec but greater than 7 sec.    Ambulating Backwards Walks 20 ft, uses assistive device, slower speed, mild gait deviations, deviates 6-10 in outside 12 in walkway width.    Steps Alternating feet, no rail.    Total Score 25    FGA comment: 25/30, low fall risk            SCORE  ITEM PERFORMANCE   0     1        2       3      4     5   WALK 9.96 sec   X >6.6 5.4-6.6 4.3-5.3 <4.3     X  WALK BACKWARD 26.6 sec  >13.3 8.1-13.3 5.8-8.0 <5.8     X  WALK ON TOES 0  >8.9 7.0-8.9 5.4-6.9 <5.4     X  WALK OVER OBSTACLE 10.59 sec  >7.1 5.4-7.1 4.5-5.3 <4.5     X  RUN 7.82 sec  >2.7 2.0-2.7 1.7-1.9 <1.7     X  SKIP 0 sec   >4.0 3.5-4.0 3.0-3.4 <3.0     X  HOP FORWARD (AFFECTED) 0 sec  >7.0 5.3-7.0 4.1-5.2 <4.1     X  BOUND (AFFECTED) 1) 76.2cm 2) 58.4 cm 3) 55.88 cm  <80 80-103 104-132 >132     X  BOUND (LESS-AFFECTED) 1) 70.5 cm 2) 81.9 cm 3) 93.98 cm  <82 82-105 106-129 >129     X  UP STAIRS DEPENDENT 0 sec  >22.8 14.6-22.8 12.3-14.5 <12.3   UP STAIRS INDEPENDENT 8.21 sec  >9.1 7.6-9.1 6.8-7.5 <6.8     X  DOWN STAIRS DEPENDENT 0 sec  >24.3 17.6-24.3 12.8-17.5 <12.8   DOWN STAIRS INDEPENDENT 8.21 SEC  >8.4 6.6-8.4 5.8-6.5 <5.8     X   SUBTOTAL 0 5 6 0 0 0  TOTAL SCORE 11/54              PATIENT EDUCATION: Education details: continue HEP, results of OM and functional implications Person educated: Patient Education method: Explanation and Demonstration Education comprehension: verbalized understanding and needs further education  HOME EXERCISE PROGRAM: Access Code: [email or web address] URL: [email or web address] Date: 02/13/2024 Prepared by: Burleigh Carp Plaster  Exercises - Half Kneeling Lift  - 1 x daily - 7 x weekly - 1 sets - 10 reps - Down Dog with Bent Knees and Heels on Mat  - 1 x daily - 7 x weekly - 2-3 reps - 15-30 seconds hold - Bird Dog  - 1 x daily - 7 x weekly - 3 sets - 10 reps  From CIR Therapist: Also instructed pt in 2 additional exercises but no printed versions available: - tall kneeling to half kneeling - sit to half kneeling and then return to sit. Perform to each side.   Verbally added 02/21/24: -squats at countertop with chair behind  you  GOALS: Goals reviewed with patient? Yes  SHORT TERM GOALS: Target date: 02/28/2024  Pt will improve FGA to 28/30 for decreased fall risk  Baseline: 21/30, 25/30 (5/13) Goal status: IN PROGRESS  2.  HiMat to be assessed at STG check and LTG updated  Baseline: 11/54 (5/13) Goal status: MET  3.  Pt will improve gait velocity to at least 3.4 ft/s for improved gait efficiency and return to PLOF Baseline: 3.0 ft/sec (eval), 3.31 ft/sec  (5/13) Goal status: IN PROGRESS  4.  ABC scale to be assessed and LTG updated  Baseline: 70.6% (5/13) Goal status: MET  5.  Pt will demonstrate proper lifting technique of 25# weight from floor for proper body mechanics and safety at work Baseline:  Goal status: MET  LONG TERM GOALS: Target date: 03/13/2024   Pt will improve gait velocity to at least 3.5 ft/s for improved gait efficiency and independence Baseline: 3.0 ft/sec (eval), 3.31 ft/sec (5/13) Goal status: REVISED  2.  Pt will improve her score on the HiMat to >/= 16/54 to demonstrate improved balance and function Baseline: 11/54 (5/13) Goal status: INITIAL  3.  Pt will improve her ABC scale score to >/= 80% for improved confidence in her balance. Baseline: 70.6% (5/13) Goal status: INITIAL  4.  Pt will be able to carry 10# object in BUEs up/down 4 steps for improved coordination and safety in work environment   Baseline: can only carry w/single UE  Goal status: INITIAL  5.  Pt will perform [place] get up w/5# weight for improved shoulder stability OH, core stability and high level balance  Baseline:  Goal status: INITIAL   ASSESSMENT:  CLINICAL IMPRESSION: Emphasis of skilled PT session on assessing STG. Pt has met 3/5 STG due to performing the HiMat and having a LTG set for this, completing the ABC and having a LTG set for this, and demonstrating the ability to retrieve a 25# weight from the floor with good lifting techniques. Pt does exhibit improved balance and decreased fall risk based on her improvement in gait speed from 3.0 to 3.3 ft/sec this date and from 21/30 to 25/30 on the FGA, however she did not quite meet goal level for these items. Overall she has shown great improvement from her initial evaluation and benefits from continued skilled PT services to continue to work towards increased safety and independence with functional mobility and return to her PLOF. Continue POC.    OBJECTIVE IMPAIRMENTS:  Abnormal gait, decreased activity tolerance, decreased balance, decreased coordination, decreased endurance, decreased mobility, decreased strength, increased muscle spasms, impaired UE functional use, improper body mechanics, and pain  ACTIVITY LIMITATIONS: carrying, lifting, squatting, stairs, reach over head, hygiene/grooming, and locomotion level  PARTICIPATION LIMITATIONS: meal prep, cleaning, laundry, interpersonal relationship, driving, shopping, community activity, occupation, and yard work  PERSONAL FACTORS: 1 comorbidity: R internal capsule infarct/R basal ganglia stroke are also affecting patient's functional outcome.   REHAB POTENTIAL: Excellent  CLINICAL DECISION MAKING: Evolving/moderate complexity  EVALUATION COMPLEXITY: Moderate  PLAN:  PT FREQUENCY: 2-3x/week (starting w/2x following eval)  PT DURATION: 6 weeks  PLANNED INTERVENTIONS: 91478- PT Re-evaluation, 97750- Physical Performance Testing, 97110-Therapeutic exercises, 97530- Therapeutic activity, [id number]- Neuromuscular re-education, 97535- Self Care, [id number]- Manual therapy, [phone]- Gait training, [phone]- Canalith repositioning, [phone]- Aquatic Therapy, Patient/Family education, Balance training, Stair training, Taping, Dry Needling, Joint mobilization, Spinal mobilization, Vestibular training, and DME instructions  PLAN FOR NEXT SESSION: Review HEP from CIR and update prn. High level agility, elliptical, L quad eccentric  strength, OH tasks. Practice w/plastic needles Sonia Durand has these) in oranges and pin the needle to targets? Cognitive dual tasking, needs to work on deep squats and return to standing from squats, agility drills, treadmill training?    Banesa Tristan, PT Lorita Rosa, PT, DPT, CSRS   02/28/2024, 11:46 AM

## 2024-02-28 NOTE — Patient Instructions (Signed)
 SPEECH:  Using CLEAR SPEECH read something daily  COGNITION (memory):   Listen to and discuss [employer] or Podcasts Read and discuss short articles of interest to you- Take notes on these if memory is a challenge Discuss social media posts Look and discuss photo albums  The best activities to improve cognition are functional, real life activities that are important to you:  Plan a menu Participate in household chores and decisions (with supervision) Participate in managing finances Plan a party, trip or tailgate with all of the details (even if you aren't really going to carry it out) Participate in your hobby as you are able with assistance Manage your texts, emails with supervision if needed. Google search for items (even if you're not really going to buy anything) and compare prices and features Socialize -  however, too many visitors can be overwhelming, so set limits "My doctor said I should only visit (or talk) for 20 minutes" or "I do better when I visit with just 1-2 people at a time for 20 minutes"    It's good to use real in-person games, not just apps  Apps:   [employer] $14/month    Start a note on phone -- keep track

## 2024-02-28 NOTE — Therapy (Deleted)
 OUTPATIENT SPEECH LANGUAGE PATHOLOGY EVALUATION   Patient Name: Faith Jordan MRN: [id number] DOB:28-Oct-1995, 28 y.o., female Today's Date: 02/28/2024  PCP: Gabriel John NP REFERRING PROVIDER: Genetta Kenning, MD  END OF SESSION:   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   Acute flank pain 06/02/2022   Angio-edema 04/27/2016   Angioedema    Aquagenic angio-edema-urticaria    Asthma    no problems /not used recently   Dysautonomia [facility])    Dysrhythmia    sinus tachycardia   Fibroid    right breast, adenoma   Headache(784.0)    History of COVID-19 11/21/2020   Hives 12/22/2015   Pneumonia    hx  6th grade   Sensation of fullness in both ears 02/02/2023   Snoring 02/11/2022   Urinary frequency 02/12/2019   Vaginal yeast infection 06/02/2022   Vasculitis (HCC)    Past Surgical History:  Procedure Laterality Date   ADENOIDECTOMY     BREAST LUMPECTOMY Right    BREAST LUMPECTOMY Right    MASS EXCISION Right 10/07/2014   Procedure: EXCISION OF RIGHT BREAST MASS;  Surgeon: Adalberto Hollow, MD;  Location: [facility] OR;  Service: General;  Laterality: Right;   TONSILLECTOMY AND ADENOIDECTOMY     TOOTH EXTRACTION     TRANSESOPHAGEAL ECHOCARDIOGRAM (CATH LAB) N/A 01/10/2024   Procedure: TRANSESOPHAGEAL ECHOCARDIOGRAM;  Surgeon: Hugh Madura, MD;  Location: [facility];  Service: Cardiovascular;  Laterality: N/A;   Patient Active Problem List   Diagnosis Date Noted   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) 02/10/2024   Coping style affecting medical condition 01/19/2024   History of CVA (cerebrovascular accident) 01/12/2024   Stroke of right basal ganglia (HCC) 01/07/2024   Non-restorative sleep 05/26/2022   Sleep paralysis, 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   Vivid dream 05/26/2022   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   Excessive daytime sleepiness 05/26/2022   Sleep disturbance  03/17/2022   Panic attacks 02/11/2022   Vitamin B12 deficiency 07/30/2021   Vitamin D  deficiency 07/30/2021   Family history of hypothyroidism 07/30/2021   GAD (generalized anxiety disorder) 03/18/2020   Fatigue 11/06/2019   Chronic back pain 07/06/2019   Preventative health care 04/27/2018   Migraines 04/25/2018   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  Rationale for Evaluation and Treatment: Rehabilitation  SUBJECTIVE:   SUBJECTIVE STATEMENT: *** Pt accompanied by: {accompnied:27141}  PERTINENT HISTORY: "Faith Jordan is a 28 y.o. 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  Last known well at 8:00 PM on 01/06/2024.  She initially thought her left-sided weakness were related to her hemiplegic migraine.  However, they usually involve her face only and not other parts of her body.  She took her migraine medications, but her hemiaplasia persisted so she came to the ED. In the ER, noncontrast head CT showed no  acute intracranial abnormality, dense calcification within the globus pallidus bilaterally, nonspecific finding that can be seen in the setting of Fahr's disease.  CT angio head and neck showing no evidence of LVO.  Noncontrast MRI brain revealed 10 mm acute nonhemorrhagic infarct of the posterior limb of the right internal  capsule, susceptibility related calcifications in the globus pallidus bilaterally. 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 CTA head & neck Normal CTA of the neck. Normal CTA Circle of Willis without significant proximal stenosis, aneurysm, or branch vessel occlusion.as US  TCD Bubble negative for right-to-left shunt, Vas US  LE negative for DVT, Hypercoagulable panel negative so far and ANA negative 2D Echo EF 60-65%.  Left atrial size normal."    PAIN:  Are you having pain? {OPRCPAIN:27236}  FALLS: Has patient fallen in last 6 months?  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  DIAGNOSTIC FINDINGS: ***  COGNITION: Overall cognitive status: {cognition:24006} Areas of impairment:  {cognitiveimpairmentslp:27409} Functional deficits: ***  AUDITORY COMPREHENSION: Overall auditory comprehension: {IMPAIRED:25374} YES/NO questions: {IMPAIRED:25374} Following directions: {IMPAIRED:25374} Conversation: {SLP conversation:25430} Interfering components: {SLP interfering components:25431} Effective technique: {SLP effective technique:25432}  READING COMPREHENSION: {SLPreadingcomprehension:27140}  EXPRESSION: {SLP EXPRESSION:25433}  VERBAL EXPRESSION: Level of generative/spontaneous verbalization: {SLP level of generative/spontaneious verbalization:25435} Automatic speech: {SLP ATOMIC SPEECH:25434}  Repetition: {SLPrepetion:27212} Naming: {SLPnaming:27214} Pragmatics: {slppragmatics:27216} Comments: *** Interfering  components: {SLP INTERFERING COMPONENTS:25436} Effective technique: {SLP EFFECTIVE TECHNIQUE:25437} Non-verbal means of communication: {SLP non verbal means of communication:25438}  WRITTEN EXPRESSION: Dominant hand: {RIGHT/LEFT:20294} Written expression: {slpwrittenexp:27209}  MOTOR SPEECH: Overall motor speech: {slpimpaired:27210} Level of impairment: {SLP level of impairment:25441} Respiration: {respbreathing:27195} Phonation: {SLP phonation:25439} Resonance: {SLP resonance:25440} Articulation: {SLParticulation:27218} Intelligibility: {SLP Intelligible:25442} Motor planning: {slpmotorspeecherrors:27220} Motor speech errors: {SLP motor speech errors:25443} Interfering components: {SLP Interfering components (MS):25444} Effective technique: {SLP effective technique (MS):25445}  ORAL MOTOR EXAMINATION: Overall status: {OMESLP2:27645} Comments: ***  RECOMMENDATIONS FROM OBJECTIVE SWALLOW STUDY (MBSS/FEES):  *** Objective swallow impairments: *** Objective recommended compensations: ***  CLINICAL SWALLOW ASSESSMENT:   Current diet: {slpdiet:27196} Dentition: {dentition:27197} Patient directly observed with POs: {POobserved:27199} Feeding: {slp feeding:27200} Liquids provided by: {SLPliquids:27201} Oral phase signs and symptoms: {SLPoralphase:27202} Pharyngeal phase signs and symptoms: {SLPpharyngealphase:27203} Comments: ***  STANDARDIZED ASSESSMENTS: {SLPstandardizedassessment:27092}  PATIENT REPORTED OUTCOME MEASURES (PROM): {SLPPROM:27095}                                                                                                                            TREATMENT DATE:  02/28/24:   PATIENT EDUCATION: Education details: see above Person educated: {Person educated:25204} Education method: {Education Method:25205} Education comprehension: {Education Comprehension:25206}   GOALS: Goals reviewed with patient? Yes  SHORT TERM GOALS: Target date:  03/27/2024  *** Baseline: Goal status: INITIAL  2.  *** Baseline:  Goal status: INITIAL  3.  *** Baseline:  Goal status: INITIAL  4.  *** Baseline:  Goal status: INITIAL  5.  *** Baseline:  Goal status: INITIAL  6.  *** Baseline:  Goal status: INITIAL  LONG TERM GOALS: Target date: ***  *** Baseline:  Goal status: INITIAL  2.  *** Baseline:  Goal status: INITIAL  3.  *** Baseline:  Goal status: INITIAL  4.  *** Baseline:  Goal status: INITIAL  5.  *** Baseline:  Goal status: INITIAL  6.  *** Baseline:  Goal status: INITIAL  ASSESSMENT:  CLINICAL IMPRESSION: Patient is a 28 y.o. F who was seen today for ST evaluation for stroke.   OBJECTIVE IMPAIRMENTS: include {SLPOBJIMP:27107}. These impairments are limiting patient from {SLPLIMIT:27108}. Factors affecting potential to achieve goals and functional outcome are {SLP factors:25450}. Patient will benefit from skilled SLP services to address above impairments and improve overall function.  REHAB POTENTIAL: Good  PLAN:  SLP FREQUENCY: {rehab frequency:25116}  SLP DURATION: {rehab duration:25117}  PLANNED INTERVENTIONS: {SLP treatment/interventions:25449}    Tamar Fairly, CCC-SLP 02/28/2024, 9:24 AM

## 2024-02-28 NOTE — Therapy (Unsigned)
 OUTPATIENT SPEECH LANGUAGE PATHOLOGY EVALUATION   Patient Name: Faith Jordan MRN: [id number] DOB:[birth year], 28 y.o., female Today's Date: 02/28/2024  PCP: Gabriel John NP REFERRING PROVIDER: Genetta Kenning, MD  END OF SESSION:  End of Session - 02/28/24 1357     Visit Number 1    Number of Visits 9    Date for SLP Re-Evaluation 05/22/24    Authorization Type [facility] employee    SLP Start Time 1100    SLP Stop Time  1145    SLP Time Calculation (min) 45 min             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   Acute flank pain 06/02/2022   Angio-edema 04/27/2016   Angioedema    Aquagenic angio-edema-urticaria    Asthma    no problems /not used recently   Dysautonomia (HCC)    Dysrhythmia    sinus tachycardia   Fibroid    right breast, adenoma   Headache(784.0)    History of COVID-19 11/21/2020   Hives 12/22/2015   Pneumonia    hx  6th grade   Sensation of fullness in both ears 02/02/2023   Snoring 02/11/2022   Urinary frequency 02/12/2019   Vaginal yeast infection 06/02/2022   Vasculitis (HCC)    Past Surgical History:  Procedure Laterality Date   ADENOIDECTOMY     BREAST LUMPECTOMY Right    BREAST LUMPECTOMY Right    MASS EXCISION Right 10/07/2014   Procedure: EXCISION OF RIGHT BREAST MASS;  Surgeon: Adalberto Hollow, MD;  Location: [facility] OR;  Service: General;  Laterality: Right;   TONSILLECTOMY AND ADENOIDECTOMY     TOOTH EXTRACTION     TRANSESOPHAGEAL ECHOCARDIOGRAM (CATH LAB) N/A 01/10/2024   Procedure: TRANSESOPHAGEAL ECHOCARDIOGRAM;  Surgeon: Hugh Madura, MD;  Location: [facility];  Service: Cardiovascular;  Laterality: N/A;   Patient Active Problem List   Diagnosis Date Noted   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) 02/10/2024   Coping style affecting medical condition 01/19/2024   History of CVA (cerebrovascular accident) 01/12/2024    Stroke of right basal ganglia (HCC) 01/07/2024   Non-restorative sleep 05/26/2022   Sleep paralysis, 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   Vivid dream 05/26/2022   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   Excessive daytime sleepiness 05/26/2022   Sleep disturbance 03/17/2022   Panic attacks 02/11/2022   Vitamin B12 deficiency 07/30/2021   Vitamin D  deficiency 07/30/2021   Family history of hypothyroidism 07/30/2021   GAD (generalized anxiety disorder) 03/18/2020   Fatigue 11/06/2019   Chronic back pain 07/06/2019   Preventative health care 04/27/2018   Migraines 04/25/2018   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   Anemia 01/04/2018   Headache disorder 12/29/2015   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   Sinus tachycardia 12/22/2015   Dysautonomia (HCC) 12/22/2015   Dysautonomia, familial (HCC) 04/22/2015   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   Neurocardiogenic syncope 04/06/2013   Intermittent palpitations 04/06/2013    ONSET DATE: 01/26/2024 (referral date)   REFERRING DIAG: I63.81 (ICD-10-CM) - Other cerebral infarction due to occlusion or stenosis of small artery  THERAPY DIAG:  Dysarthria and anarthria  Cognitive communication deficit  Rationale for Evaluation and Treatment: Rehabilitation  SUBJECTIVE:   SUBJECTIVE STATEMENT: Pt presenting with concerns regarding her memory and motor speech. Flat affect throughout session. Pt accompanied by: self  PERTINENT HISTORY: "Faith Jordan is a 28 y.o. 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  Last known well at 8:00 PM on 01/06/2024.  She initially thought her left-sided weakness were related to her hemiplegic migraine.  However, they usually involve her face only and not other parts of her body.  She took her migraine medications, but her hemiaplasia persisted so she came to the ED. In  the ER, noncontrast head CT showed no acute intracranial abnormality, dense calcification within the globus pallidus bilaterally, nonspecific finding that can be seen in the setting of Fahr's disease.  CT angio head and neck showing no evidence of LVO.  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 CTA head & neck Normal CTA of the neck. Normal CTA Circle of Willis without significant proximal stenosis, aneurysm, or branch vessel occlusion.as US  TCD Bubble negative for right-to-left shunt, Vas US  LE negative for DVT, Hypercoagulable panel negative so far and ANA negative 2D Echo EF 60-65%.  Left atrial size normal."    PAIN:  Are you having pain? No  FALLS: Has patient fallen in last 6 months?  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  COGNITION: Overall cognitive status: Impaired Areas of impairment: attention, memory  Functional deficits: requires additional effort to maintain attention, forgetting details, leaving items behind.   AUDITORY COMPREHENSION: Overall auditory comprehension: Appears intact  READING COMPREHENSION: Intact  EXPRESSION: verbal  VERBAL EXPRESSION: Level of generative/spontaneous verbalization: conversation Overall spoken  expression: within gross functional limits, not formally assessed. Pt denies challenges with expressing thoughts, denies anomia.   WRITTEN EXPRESSION: Dominant hand: right Written expression: Appears intact  MOTOR SPEECH: assessed across variety of speech tasks: reading, word repetition, generative discourse sample Overall motor speech: impaired Level of impairment: Word Rate of Speech: WFL Dysfluencies: none evidenced Phonation: low vocal intensity Oral reading loudness average: 67 dB Conversational loudness average: 66 dB Voice Quality: normal Respiration: thoracic breathing Word and Phrasal Stress: reduced use of stress Resonance: WFL Articulation: reduced precision  Diadochokinetic Rate (DDK): slow rate Intelligibility: Intelligible Motor planning: Appears intact Interfering components: fatigue Effective technique: slow rate, increased vocal intensity, and over articulate  ORAL MOTOR EXAMINATION: Overall status: WFL  STANDARDIZED ASSESSMENTS: Cognitive Domain Severity Rating  Attention WNL  Memory WNL  Executive Function WNL  Language WNL  Visuospatial Skills WNL  Clock Drawing  WNL  Composite Severity Rating WNL    PATIENT REPORTED OUTCOME MEASURES (PROM): To be completed first therapy session  TREATMENT DATE:  02/28/24: Education provided on evaluation results and SLP's recommendations. Pt verbalizes agreement with POC, all questions answered to satisfaction.  Initiated training re: "clear speech" for improved speech production. Education provided regarding clear speech principles of reduced rate, increased loudness, and over-articulation. Demonstration provided. HEP initiated for use of clear speech during daily reading. HEP resources provided for cognition.     PATIENT EDUCATION: Education details: see above Person educated:  Patient Education method: Explanation, Demonstration, and Handouts Education comprehension: verbalized understanding, returned demonstration, and needs further education   GOALS: Goals reviewed with patient? Yes  SHORT TERM GOALS: Target date: 04/10/2024  Pt will use log to ID functional memory mistakes or cognitive concerns in home environment over 2 week period Baseline: Goal status: INITIAL  2.  Pt will utilize dysarthria strategies and compensations during oral reading 2+ paragraphs with WNL volume and improved articulatory precision with mod-I Baseline:  Goal status: INITIAL  3.  Pt will teach back compensations for the dysarthric speaker with mod-I Baseline:  Goal status: INITIAL   LONG TERM GOALS: Target date: 05/22/2024  Pt will develop and utilize memory support strategies or external cognitive aid to address x2 home based and x2 work based memory concerns  Baseline:  Goal status: INITIAL  2.  Pt will complete dysarthria and cognitive skill HEP daily over 2 week period Baseline:  Goal status: INITIAL  3.  Pt will utilize dysarthria strategies and compensations during 20 minute conversation with WNL volume and improved articulatory precision with mod-I Baseline:  Goal status: INITIAL  ASSESSMENT:  CLINICAL IMPRESSION: Patient is a 28 y.o. F who was seen today for ST evaluation for stroke. Evaluation reveals mild dysarthria and standardized cognitive assessment completed with pt scoring WNL. Pt's speech is primarily c/b reduced volume, imprecise articulation and flat affect. Pt reports speaking louder/clearing takes a lot of effort, speech worsens with fatigue. Pt endorsing concerns regarding her executive function and memory skills. She tells SLP she has to focus "really hard" in order to process information, supported by pt's presentation during today's evaluation. Pt would occasionally ask for repetition of directions or re-explanation of concepts provided. 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 Pt having trouble classifying how percieved memory impairments impacting daily activities, I asked her to keep a log so therapy can be directed at improved performance of needed activities. Will plan to attempt simulation of word-related cognitive tasks as well to ensure skills intact for complex job duties or ID of potential facilitative accommodations. I recommend skilled ST to address aforementioned deficits to maximize independence and return to baseline.    OBJECTIVE IMPAIRMENTS: include attention, memory, and dysarthria. These impairments are limiting patient from return to work, household responsibilities, and effectively communicating at home and in community.Factors affecting potential to achieve goals and functional outcome are no overt barriers. Patient will benefit from skilled SLP services to address above impairments and improve overall function.  REHAB POTENTIAL: Good  PLAN:  SLP FREQUENCY: 2x/week  SLP DURATION: 12 weeks  PLANNED INTERVENTIONS: Cueing hierachy, Cognitive reorganization, Internal/external aids, Functional tasks, SLP instruction and feedback, Compensatory strategies, and Patient/family education    Alston Jerry, CCC-SLP 02/28/2024, 1:58 PM

## 2024-02-29 ENCOUNTER — Telehealth: Payer: Self-pay | Admitting: Neurology

## 2024-02-29 NOTE — Telephone Encounter (Signed)
 Referral for neurology fax to [facility]. Phone: [phone], Fax: [phone]

## 2024-03-01 ENCOUNTER — Encounter: Payer: Self-pay | Admitting: Neurology

## 2024-03-01 ENCOUNTER — Other Ambulatory Visit: Payer: Self-pay | Admitting: Neurology

## 2024-03-01 DIAGNOSIS — I639 Cerebral infarction, unspecified: Secondary | ICD-10-CM

## 2024-03-01 NOTE — Telephone Encounter (Signed)
 Dr. Tresia Fruit updated referral for Neurology for patient to see Dr. Maryalice Smaller at [facility]. Phone: [phone], Fax: [phone]

## 2024-03-02 ENCOUNTER — Ambulatory Visit (INDEPENDENT_AMBULATORY_CARE_PROVIDER_SITE_OTHER): Admitting: Psychology

## 2024-03-02 ENCOUNTER — Ambulatory Visit: Admitting: Physical Therapy

## 2024-03-02 ENCOUNTER — Ambulatory Visit: Admitting: Occupational Therapy

## 2024-03-02 VITALS — BP 109/75 | HR 93

## 2024-03-02 DIAGNOSIS — F330 Major depressive disorder, recurrent, mild: Secondary | ICD-10-CM

## 2024-03-02 DIAGNOSIS — R2689 Other abnormalities of gait and mobility: Secondary | ICD-10-CM

## 2024-03-02 DIAGNOSIS — R2681 Unsteadiness on feet: Secondary | ICD-10-CM

## 2024-03-02 DIAGNOSIS — R29898 Other symptoms and signs involving the musculoskeletal system: Secondary | ICD-10-CM

## 2024-03-02 DIAGNOSIS — M6281 Muscle weakness (generalized): Secondary | ICD-10-CM

## 2024-03-02 DIAGNOSIS — F411 Generalized anxiety disorder: Secondary | ICD-10-CM

## 2024-03-02 DIAGNOSIS — R29818 Other symptoms and signs involving the nervous system: Secondary | ICD-10-CM

## 2024-03-02 NOTE — Progress Notes (Signed)
 [facility] Counselor/Therapist Progress Note  Patient ID: Faith Jordan, MRN: [id number],    Date:  03/02/2024  Time Spent: 56 minutes  Time in: 9:04 Time out: 10:00  Treatment Type:- The patient was seen via video visit.  She gave verbal consent for the session to be on video on caregility.  The patient was in her home alone and therapist was in the office.  Reported Symptoms: anxiety, panic attacks  Mental Status Exam: Appearance:  Casual     Behavior: Appropriate  Motor: Normal  Speech/Language:  Normal Rate  Affect: blunted  Mood: sad  Thought process: normal  Thought content:   WNL  Sensory/Perceptual disturbances:   WNL  Orientation: oriented to person, place, time/date, and situation  Attention: Good  Concentration: Good  Memory: WNL  Fund of knowledge:  Good  Insight:   Fair  Judgment:  Good  Impulse Control: Good   Risk Assessment: Danger to Self:  No Self-injurious Behavior: No Danger to Others: No Duty to Warn:no Physical Aggression / Violence:No  Access to Firearms a concern: No  Gang Involvement:No   Subjective: The patient was seen for an individual therapy session  via video visit today.  The patient gave verbal permission for the session to be on video on caregility and she is aware of the limitations of telehealth.  The patient was in her home  and therapist was in the office.  The patient presents with a blunted affect and mood is sad. The patient reports that she had a stroke in March and it has changed her life.  She states that she was sleeping and woke up to go the the restroom and could not get out of bed.  She states that she called her father and he could not understand what she was saying and he came over and they went to the emergency room.  She says that he whole left side was paralyzed.  We talked about how she is handling things and she reports that she is depressed.  She saw her medication provider recently and they kept the  medicine the same.  We talked about increasing our visits and talked about us  figuring out things that she can do at home to help her recover.      Interventions: Cognitive Behavioral Therapy and Assertiveness/Communication,, problem solving, psychoeducation,  EMDR as indicated, Meditation and mindfulness,   Diagnosis:Major depressive disorder, recurrent episode, mild (HCC)  GAD (generalized anxiety disorder)  Plan: Client Abilities/Strengths  Intelligent, insightful, motivated  Client Treatment Preferences  Outpatient Individual therapy every other week  Client Statement of Needs  " I feel better now, but still need some help with anxiety"  Treatment Level  Outpatient Individual therapy  Symptoms  Frustration and anxiety related to providing oversight and caretaking to an aging, ailing, and dependent  parent.: No Description Entered (Status: improved). Hypervigilance (e.g., feeling constantly on edge,  experiencing concentration difficulties, having trouble falling or staying asleep, exhibiting a general  state of irritability).: No Description Entered (Status: improved). Motor tension (e.g., restlessness,  tiredness, shakiness, muscle tension).: No Description Entered (Status: improved).  Problems Addressed  Anxiety, Phase Of Life Problems, Anxiety  Goals 1. Learn and implement coping skills that result in a reduction of anxiety  and worry, and improved daily functioning. Objective Learn and implement calming skills to reduce overall anxiety and manage anxiety symptoms. Target Date: 2025-08-09Frequency: Weekly Progress: 40 Modality: individual  Related Interventions 1. Teach the client calming/relaxation skills (e.g., applied relaxation, progressive  muscle  relaxation, cue controlled relaxation; mindful breathing; biofeedback) and how to discriminate  better between relaxation and tension; teach the client how to apply these skills to his/her daily  life (e.g., New Directions  in Progressive Muscle Relaxation by Fara Hone, and  Hazlett-Stevens; Treating Generalized Anxiety Disorder by Rygh and Joya Nissen). Objective Identify, challenge, and replace biased, fearful self-talk with positive, realistic, and empowering selftalk. Target Date: 2024-05-26 Frequency: weekly Progress: 30 Modality: individual Related Interventions 1. Explore the client's schema and self-talk that mediate his/her fear response; assist him/her in  challenging the biases; replace the distorted messages with reality-based alternatives and  positive, realistic self-talk that will increase his/her self-confidence in coping with irrational  fears (see Cognitive Therapy of Anxiety Disorders by Anderson Kaufman). Objective Learn and implement problem-solving strategies for realistically addressing worries. Target Date: 2025-08-09Frequency: weekly Progress: 40 Modality: individual 2. Resolve conflicted feelings and adapt to the new life circumstances. Objective Apply problem-solving skills to current circumstances. Target Date: 2024-05-26 Frequency: weekly Progress: 20 Modality: individual Related Interventions 1. Teach the client problem-resolution skills (e.g., defining the problem clearly, brainstorming  multiple solutions, listing the pros and cons of each solution, seeking input from others,  selecting and implementing a plan of action, evaluating outcome, and readjusting plan as  necessary).   3. Stabilize anxiety level while increasing ability to function on a daily  basis. Diagnosis F33.1  Major depressive disorder, moderate 300.02 (Generalized anxiety disorder) - Open - [Signifier: n/a]  Axis  none 309.28 (Adjustment disorder with mixed anxiety and depressed  mood) - Open - [Signifier: n/a]  Adjustment Disorder,  With Anxiety   Marital conflict  Major Depressive disorder, moderate  Conditions For Discharge Achievement of treatment goals and objectives.  The patient  approved this plan.   Eugene Isadore G Lunah Losasso, LCSW

## 2024-03-02 NOTE — Therapy (Addendum)
 OUTPATIENT PHYSICAL THERAPY NEURO TREATMENT   Patient Name: Faith Jordan MRN: [id number] DOB:10-20-1995, 28 y.o., female Today's Date: 03/02/2024   PCP: Gabriel John, NP REFERRING PROVIDER: Genetta Kenning, MD  END OF SESSION:  PT End of Session - 03/02/24 1149     Visit Number 9    Number of Visits 13    Date for PT Re-Evaluation 03/20/24    Authorization Type [employer]    PT Start Time 1148    PT Stop Time 1233    PT Time Calculation (min) 45 min    Equipment Utilized During Treatment Gait belt    Activity Tolerance Patient tolerated treatment well    Behavior During Therapy [facility] for tasks assessed/performed                   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   Acute flank pain 06/02/2022   Angio-edema 04/27/2016   Angioedema    Aquagenic angio-edema-urticaria    Asthma    no problems /not used recently   Dysautonomia (HCC)    Dysrhythmia    sinus tachycardia   Fibroid    right breast, adenoma   Headache(784.0)    History of COVID-19 11/21/2020   Hives 12/22/2015   Pneumonia    hx  6th grade   Sensation of fullness in both ears 02/02/2023   Snoring 02/11/2022   Urinary frequency 02/12/2019   Vaginal yeast infection 06/02/2022   Vasculitis (HCC)    Past Surgical History:  Procedure Laterality Date   ADENOIDECTOMY     BREAST LUMPECTOMY Right    BREAST LUMPECTOMY Right    MASS EXCISION Right 10/07/2014   Procedure: EXCISION OF RIGHT BREAST MASS;  Surgeon: Adalberto Hollow, MD;  Location: [facility] OR;  Service: General;  Laterality: Right;   TONSILLECTOMY AND ADENOIDECTOMY     TOOTH EXTRACTION     TRANSESOPHAGEAL ECHOCARDIOGRAM (CATH LAB) N/A 01/10/2024   Procedure: TRANSESOPHAGEAL ECHOCARDIOGRAM;  Surgeon: Hugh Madura, MD;  Location: [facility] INVASIVE CV LAB;  Service: Cardiovascular;  Laterality: N/A;   Patient Active Problem List   Diagnosis Date Noted   Spastic hemiparesis of left  nondominant side due to acute cerebral infarction (HCC) 02/10/2024   Coping style affecting medical condition 01/19/2024   History of CVA (cerebrovascular accident) 01/12/2024   Stroke of right basal ganglia (HCC) 01/07/2024   Non-restorative sleep 05/26/2022   Sleep paralysis, 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   Vivid dream 05/26/2022   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   Excessive daytime sleepiness 05/26/2022   Sleep disturbance 03/17/2022   Panic attacks 02/11/2022   Vitamin B12 deficiency 07/30/2021   Vitamin D  deficiency 07/30/2021   Family history of hypothyroidism 07/30/2021   GAD (generalized anxiety disorder) 03/18/2020   Fatigue 11/06/2019   Chronic back pain 07/06/2019   Preventative health care 04/27/2018   Migraines 04/25/2018   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   Anemia 01/04/2018   Headache disorder 12/29/2015   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   Sinus tachycardia 12/22/2015   Dysautonomia (HCC) 12/22/2015   Dysautonomia, familial (HCC) 04/22/2015   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   Neurocardiogenic syncope 04/06/2013   Intermittent palpitations 04/06/2013    ONSET DATE: 01/26/2024 (referral)   REFERRING DIAG:  I63.81 (ICD-10-CM) - Other cerebral infarction due to occlusion or stenosis of small artery    THERAPY DIAG:  Other symptoms and signs involving the musculoskeletal system  Other symptoms and  signs involving the nervous system  Muscle weakness (generalized)  Other abnormalities of gait and mobility  Unsteadiness on feet  Rationale for Evaluation and Treatment: Rehabilitation  SUBJECTIVE:                                                                                                                                                                                             SUBJECTIVE STATEMENT: Pt denies any acute changes since last visit, has been able to eat better.  Pt accompanied by: Self and Mom   PERTINENT HISTORY:  familiar hemiplegic migraine, angioedema 2017,dysautonomia; infarct right internal capsule/ R basal ganglia stroke  PAIN:  Are you having pain? No    PRECAUTIONS: Fall  RED FLAGS: None   WEIGHT BEARING RESTRICTIONS: No  FALLS: Has patient fallen in last 6 months? No  LIVING ENVIRONMENT: Lives with: lives with their family Lives in: House/apartment Stairs: Yes: External: 4 steps; bilateral but cannot reach both Has following equipment at home: shower chair  PLOF: Independent  PATIENT GOALS: "Being normal again. Being able to go back to work and be independent"   OBJECTIVE:  Note: Objective measures were completed at Evaluation unless otherwise noted.  DIAGNOSTIC FINDINGS: MRI of brain on 01/07/24  IMPRESSION: 1. 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 2. Susceptibility related calcifications in the globus pallidus bilaterally. This likely reflects the sequela of chronic microvascular ischemia.  COGNITION: Overall cognitive status: Within functional limits for tasks assessed   SENSATION: Pt denies numbness/tingling in L hemibody   MUSCLE TONE: Non-fatiguing clonus of LLE  POSTURE: No Significant postural limitations  LOWER EXTREMITY ROM:     Active  Right Eval Left Eval  Hip flexion    Hip extension    Hip abduction    Hip adduction    Hip internal rotation    Hip external rotation    Knee flexion    Knee extension    Ankle dorsiflexion    Ankle plantarflexion    Ankle inversion    Ankle eversion     (Blank rows = not tested)  LOWER EXTREMITY MMT:  Tested in seated position   MMT Right Eval Left Eval  Hip flexion 5 4+  Hip extension    Hip abduction 5 5  Hip adduction 5 5  Hip internal rotation    Hip external rotation    Knee flexion 5 5  Knee extension 5 4+  Ankle dorsiflexion 5 5  Ankle plantarflexion    Ankle inversion    Ankle eversion    (Blank rows = not tested)  BED MOBILITY:  Not  tested  TRANSFERS: Sit to stand: Complete Independence  Assistive device utilized: None     Stand to sit: Complete Independence  Assistive device utilized: None      RAMP:  Not tested  CURB:  Not tested  GAIT: Gait pattern: step through pattern, decreased arm swing- Left, decreased step length- Right, decreased stance time- Left, decreased stride length, decreased hip/knee flexion- Left, lateral hip instability, and wide BOS Distance walked: Various clinic distances  Assistive device utilized: None Level of assistance: Modified independence Comments: Pt walks very guarded, as if she feels unstable. No instability noted other than single instance of L knee buckling which pt able to self-correct.    PATIENT SURVEYS:  ABC scale to be obtained                                                                                                                               TREATMENT:    Self-Care Vitals:   03/02/24 1157  BP: 109/75  Pulse: 93   Seated BP assessed in LUE at rest, vitals WNL though heart rate elevated as compared to reading last visit.    NMR To work on LE coordination and NMR with dynamic balance and strengthening: Stance jacks 2 x 10 reps at ballet bar with BUE support Lateral bounding in // bars 6 x 10 ft L/R Improved coordination as task progresses Karaoke in // bars 3 x 10 ft L/R Performed at slowed speed to focus on LE coordination and balance Alt L/R lunges x 10 reps B, x 5 reps B Onset of LLE shaking with onset of fatigue Does need UE support on // bars to prevent LOB Resisted mini squat sidestep with red TB 3 x 10 ft L/R, x 2 reps Resisted 6" step taps with red TB x 10 reps B, no UE support   Added exercises to HEP, see bolded below      PATIENT EDUCATION: Education details: continue HEP, added to HEP, discussed PT POC and adding visits, energy conservation while also working on improving her endurance Person educated: Patient and Parent Education  method: Medical illustrator Education comprehension: verbalized understanding and needs further education  HOME EXERCISE PROGRAM: Access Code: [email or web address] URL: [email or web address] Date: 02/13/2024 Prepared by: Burleigh Carp Plaster  Exercises - Half Kneeling Lift  - 1 x daily - 7 x weekly - 1 sets - 10 reps - Down Dog with Bent Knees and Heels on Mat  - 1 x daily - 7 x weekly - 2-3 reps - 15-30 seconds hold - Bird Dog  - 1 x daily - 7 x weekly - 3 sets - 10 reps - Lateral Shuffles  - 1 x daily - 7 x weekly - 3 sets - 10 reps - Braided Sidestepping  - 1 x daily - 7 x weekly - 3 sets - 10 reps - Standing Balance Activity: Plyometric Modified Lower Extremity Jumping Jack  - 1 x daily -  7 x weekly - 3 sets - 10 reps - Standing Forward Step Taps with Counter Support  - 1 x daily - 7 x weekly - 3 sets - 10 reps (at bottom of steps with no UE support, red TB around ankles)  From CIR Therapist: Also instructed pt in 2 additional exercises but no printed versions available: - tall kneeling to half kneeling - sit to half kneeling and then return to sit. Perform to each side.   Verbally added 02/21/24: -squats at countertop with chair behind you  GOALS: Goals reviewed with patient? Yes  SHORT TERM GOALS: Target date: 02/28/2024  Pt will improve FGA to 28/30 for decreased fall risk  Baseline: 21/30, 25/30 (5/13) Goal status: IN PROGRESS  2.  HiMat to be assessed at STG check and LTG updated  Baseline: 11/54 (5/13) Goal status: MET  3.  Pt will improve gait velocity to at least 3.4 ft/s for improved gait efficiency and return to PLOF Baseline: 3.0 ft/sec (eval), 3.31 ft/sec (5/13) Goal status: IN PROGRESS  4.  ABC scale to be assessed and LTG updated  Baseline: 70.6% (5/13) Goal status: MET  5.  Pt will demonstrate proper lifting technique of 25# weight from floor for proper body mechanics and safety at work Baseline:  Goal status: MET  LONG TERM GOALS: Target date:  03/13/2024   Pt will improve gait velocity to at least 3.5 ft/s for improved gait efficiency and independence Baseline: 3.0 ft/sec (eval), 3.31 ft/sec (5/13) Goal status: REVISED  2.  Pt will improve her score on the HiMat to >/= 16/54 to demonstrate improved balance and function Baseline: 11/54 (5/13) Goal status: INITIAL  3.  Pt will improve her ABC scale score to >/= 80% for improved confidence in her balance. Baseline: 70.6% (5/13) Goal status: INITIAL  4.  Pt will be able to carry 10# object in BUEs up/down 4 steps for improved coordination and safety in work environment   Baseline: can only carry w/single UE  Goal status: INITIAL  5.  Pt will perform [place] get up w/5# weight for improved shoulder stability OH, core stability and high level balance  Baseline:  Goal status: INITIAL   ASSESSMENT:  CLINICAL IMPRESSION: Emphasis of skilled PT session on working on LE strengthening, NMR, and dynamic and static balance. Pt with ongoing impairments of LLE with muscles fatiguing quickly during functional activities and strengthening. Pt does exhibit improved recovery of muscles and ability to return to therapy tasks during session. She continues to benefit from continued skilled PT services to continue to work towards increased safety and independence with functional mobility and return to her PLOF. Continue POC.    OBJECTIVE IMPAIRMENTS: Abnormal gait, decreased activity tolerance, decreased balance, decreased coordination, decreased endurance, decreased mobility, decreased strength, increased muscle spasms, impaired UE functional use, improper body mechanics, and pain  ACTIVITY LIMITATIONS: carrying, lifting, squatting, stairs, reach over head, hygiene/grooming, and locomotion level  PARTICIPATION LIMITATIONS: meal prep, cleaning, laundry, interpersonal relationship, driving, shopping, community activity, occupation, and yard work  PERSONAL FACTORS: 1 comorbidity: R internal  capsule infarct/R basal ganglia stroke are also affecting patient's functional outcome.   REHAB POTENTIAL: Excellent  CLINICAL DECISION MAKING: Evolving/moderate complexity  EVALUATION COMPLEXITY: Moderate  PLAN:  PT FREQUENCY: 2-3x/week (starting w/2x following eval)  PT DURATION: 6 weeks  PLANNED INTERVENTIONS: 97164- PT Re-evaluation, 97750- Physical Performance Testing, 97110-Therapeutic exercises, 97530- Therapeutic activity, [id number]- Neuromuscular re-education, 97535- Self Care, 16109- Manual therapy, [id number]- Gait training, [phone]- Canalith repositioning, [id number]- Aquatic Therapy,  Patient/Family education, Balance training, Stair training, Taping, Dry Needling, Joint mobilization, Spinal mobilization, Vestibular training, and DME instructions  PLAN FOR NEXT SESSION: 10th PN,  Review HEP from CIR and update prn. High level agility, elliptical, L quad eccentric strength, OH tasks. Practice w/plastic needles Sonia Durand has these) in oranges and pin the needle to targets? Cognitive dual tasking, needs to work on deep squats and return to standing from squats, agility drills, treadmill training with focus on increasing speed and potentially running? Half kneel  Already scheduled additional 2x/week for 6 weeks for PT/OT, will recert at 5/29 visit   Lorita Rosa, PT Lorita Rosa, PT, DPT, CSRS   03/02/2024, 12:34 PM

## 2024-03-05 ENCOUNTER — Ambulatory Visit: Admitting: Physical Therapy

## 2024-03-05 ENCOUNTER — Ambulatory Visit: Admitting: Occupational Therapy

## 2024-03-05 VITALS — BP 96/69 | HR 89

## 2024-03-05 DIAGNOSIS — I69354 Hemiplegia and hemiparesis following cerebral infarction affecting left non-dominant side: Secondary | ICD-10-CM

## 2024-03-05 DIAGNOSIS — M6281 Muscle weakness (generalized): Secondary | ICD-10-CM | POA: Diagnosis not present

## 2024-03-05 DIAGNOSIS — R29818 Other symptoms and signs involving the nervous system: Secondary | ICD-10-CM

## 2024-03-05 DIAGNOSIS — R2689 Other abnormalities of gait and mobility: Secondary | ICD-10-CM

## 2024-03-05 DIAGNOSIS — R29898 Other symptoms and signs involving the musculoskeletal system: Secondary | ICD-10-CM

## 2024-03-05 DIAGNOSIS — R2681 Unsteadiness on feet: Secondary | ICD-10-CM

## 2024-03-05 DIAGNOSIS — R278 Other lack of coordination: Secondary | ICD-10-CM

## 2024-03-05 NOTE — Therapy (Signed)
 OUTPATIENT PHYSICAL THERAPY NEURO TREATMENT - 10TH VISIT PROGRESS NOTE   Patient Name: Faith Jordan MRN: [id number] DOB:04-Aug-1996, 28 y.o., female Today's Date: 03/05/2024   PCP: Gabriel John, NP REFERRING PROVIDER: Genetta Kenning, MD  Physical Therapy Progress Note   Dates of Reporting Period:01/31/24 - 03/05/24  See Note below for Objective Data and Assessment of Progress/Goals.    END OF SESSION:  PT End of Session - 03/05/24 0803     Visit Number 10    Number of Visits 13    Date for PT Re-Evaluation 03/20/24    Authorization Type [employer]    PT Start Time 0802    PT Stop Time [phone]    PT Time Calculation (min) 44 min    Equipment Utilized During Treatment Gait belt    Activity Tolerance Patient tolerated treatment well    Behavior During Therapy [facility] for tasks assessed/performed                   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   Acute flank pain 06/02/2022   Angio-edema 04/27/2016   Angioedema    Aquagenic angio-edema-urticaria    Asthma    no problems /not used recently   Dysautonomia (HCC)    Dysrhythmia    sinus tachycardia   Fibroid    right breast, adenoma   Headache(784.0)    History of COVID-19 11/21/2020   Hives 12/22/2015   Pneumonia    hx  6th grade   Sensation of fullness in both ears 02/02/2023   Snoring 02/11/2022   Urinary frequency 02/12/2019   Vaginal yeast infection 06/02/2022   Vasculitis (HCC)    Past Surgical History:  Procedure Laterality Date   ADENOIDECTOMY     BREAST LUMPECTOMY Right    BREAST LUMPECTOMY Right    MASS EXCISION Right 10/07/2014   Procedure: EXCISION OF RIGHT BREAST MASS;  Surgeon: Adalberto Hollow, MD;  Location: [facility] OR;  Service: General;  Laterality: Right;   TONSILLECTOMY AND ADENOIDECTOMY     TOOTH EXTRACTION     TRANSESOPHAGEAL ECHOCARDIOGRAM (CATH LAB) N/A 01/10/2024   Procedure: TRANSESOPHAGEAL ECHOCARDIOGRAM;   Surgeon: Hugh Madura, MD;  Location: [facility] INVASIVE CV LAB;  Service: Cardiovascular;  Laterality: N/A;   Patient Active Problem List   Diagnosis Date Noted   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) 02/10/2024   Coping style affecting medical condition 01/19/2024   History of CVA (cerebrovascular accident) 01/12/2024   Stroke of right basal ganglia (HCC) 01/07/2024   Non-restorative sleep 05/26/2022   Sleep paralysis, 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   Vivid dream 05/26/2022   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   Excessive daytime sleepiness 05/26/2022   Sleep disturbance 03/17/2022   Panic attacks 02/11/2022   Vitamin B12 deficiency 07/30/2021   Vitamin D  deficiency 07/30/2021   Family history of hypothyroidism 07/30/2021   GAD (generalized anxiety disorder) 03/18/2020   Fatigue 11/06/2019   Chronic back pain 07/06/2019   Preventative health care 04/27/2018   Migraines 04/25/2018   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   Anemia 01/04/2018   Headache disorder 12/29/2015   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   Sinus tachycardia 12/22/2015   Dysautonomia (HCC) 12/22/2015   Dysautonomia, familial (HCC) 04/22/2015   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   Neurocardiogenic syncope 04/06/2013   Intermittent palpitations 04/06/2013    ONSET DATE: 01/26/2024 (referral)   REFERRING DIAG:  I63.81 (  ICD-10-CM) - Other cerebral infarction due to occlusion or stenosis of small artery    THERAPY DIAG:  Muscle weakness (generalized)  Other abnormalities of gait and mobility  Unsteadiness on feet  Rationale for Evaluation and Treatment: Rehabilitation  SUBJECTIVE:                                                                                                                                                                                             SUBJECTIVE STATEMENT: Pt presents w/mom. States her L foot has been hurting more, only  change has been wearing the Birkenstocks more. Mom reports pt fell this morning, was getting laundry out of the dryer in a deep squat and toppled backwards. No injuries and pt was able to get up on her own, pt does not consider this a fall. No pain currently.   Pt accompanied by: Self and Mom   PERTINENT HISTORY: familiar hemiplegic migraine, angioedema 2017,dysautonomia; infarct right internal capsule/ R basal ganglia stroke  PAIN:  Are you having pain? No    PRECAUTIONS: Fall  RED FLAGS: None   WEIGHT BEARING RESTRICTIONS: No  FALLS: Has patient fallen in last 6 months? No  LIVING ENVIRONMENT: Lives with: lives with their family Lives in: House/apartment Stairs: Yes: External: 4 steps; bilateral but cannot reach both Has following equipment at home: shower chair  PLOF: Independent  PATIENT GOALS: "Being normal again. Being able to go back to work and be independent"   OBJECTIVE:  Note: Objective measures were completed at Evaluation unless otherwise noted.  DIAGNOSTIC FINDINGS: MRI of brain on 01/07/24  IMPRESSION: 1. 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 2. Susceptibility related calcifications in the globus pallidus bilaterally. This likely reflects the sequela of chronic microvascular ischemia.  COGNITION: Overall cognitive status: Within functional limits for tasks assessed   SENSATION: Pt denies numbness/tingling in L hemibody   MUSCLE TONE: Non-fatiguing clonus of LLE  POSTURE: No Significant postural limitations  LOWER EXTREMITY ROM:     Active  Right Eval Left Eval  Hip flexion    Hip extension    Hip abduction    Hip adduction    Hip internal rotation    Hip external rotation    Knee flexion    Knee extension    Ankle dorsiflexion    Ankle plantarflexion    Ankle inversion    Ankle eversion     (Blank rows = not tested)  LOWER EXTREMITY MMT:  Tested in seated position   MMT Right Eval  Left Eval  Hip flexion 5 4+  Hip extension  Hip abduction 5 5  Hip adduction 5 5  Hip internal rotation    Hip external rotation    Knee flexion 5 5  Knee extension 5 4+  Ankle dorsiflexion 5 5  Ankle plantarflexion    Ankle inversion    Ankle eversion    (Blank rows = not tested)  BED MOBILITY:  Not tested  TRANSFERS: Sit to stand: Complete Independence  Assistive device utilized: None     Stand to sit: Complete Independence  Assistive device utilized: None      RAMP:  Not tested  CURB:  Not tested  GAIT: Gait pattern: step through pattern, decreased arm swing- Left, decreased step length- Right, decreased stance time- Left, decreased stride length, decreased hip/knee flexion- Left, lateral hip instability, and wide BOS Distance walked: Various clinic distances  Assistive device utilized: None Level of assistance: Modified independence Comments: Pt walks very guarded, as if she feels unstable. No instability noted other than single instance of L knee buckling which pt able to self-correct.    PATIENT SURVEYS:  ABC scale to be obtained    VITALS  Vitals:   03/05/24 0813  BP: 96/69  Pulse: 89                                                                                                                                 TREATMENT:    Self-Care Assessed vitals (see above) and BP on lower end but WNL for session. Pt reports this is normal for her    NMR The following treadmill training was completed for aerobic/neural priming, endurance, and gait speed.  - Warmup: 3:00 up to 1.6 mph w/no UE support  - HIIT: 5:00 30sec ON/OFF alternating 2.1 / 3.0 mph w/no UE support. Increased difficulty w/stabilization w/slower speed compared to fast speed, requiring occasional CGA due to lateral deviations  - Cool down: 1:00 at 2.1 mph   Pt performed floor transfer to red floor mat w/o UE support mod I:  On red floor mat in front of rebounder for improved proximal  stability, LLE weightbearing and anticipatory balance strategies:  In half kneel tossing 2kg ball to rebounder, x15 reps per side w/CGA-light min A.  Progressed to tall > half kneel transition w/single ball toss, x8 reps per side. Increased difficulty w/transition on L side.   Pt performed tall kneel > stand mod I without UE support  W/heels elevated on 1.5" gray beam, goblet squats w/10# KB for improved quad strength and stability, x10 reps. Pt reports not feeling as though she was using her LLE, but noted midline position throughout.  Added resisted ankle inversion to HEP (see bolded below) to work on supination of L ankle and reduction of tone in standing.      PATIENT EDUCATION: Education details: Updates to HEP, working on [employer] without shoes on for ankle strengthening, realistic timeline for return to work  Person educated: Patient and Parent Education method:  Explanation, Demonstration, and Handouts Education comprehension: verbalized understanding and needs further education  HOME EXERCISE PROGRAM: Access Code: [email or web address] URL: [email or web address] Date: 02/13/2024 Prepared by: Burleigh Carp Loxley Schmale  Exercises - Half Kneeling Lift  - 1 x daily - 7 x weekly - 1 sets - 10 reps - Down Dog with Bent Knees and Heels on Mat  - 1 x daily - 7 x weekly - 2-3 reps - 15-30 seconds hold - Bird Dog  - 1 x daily - 7 x weekly - 3 sets - 10 reps - Lateral Shuffles  - 1 x daily - 7 x weekly - 3 sets - 10 reps - Braided Sidestepping  - 1 x daily - 7 x weekly - 3 sets - 10 reps - Standing Balance Activity: Plyometric Modified Lower Extremity Jumping Jack  - 1 x daily - 7 x weekly - 3 sets - 10 reps - Standing Forward Step Taps with Counter Support  - 1 x daily - 7 x weekly - 3 sets - 10 reps (at bottom of steps with no UE support, red TB around ankles) - Ankle Inversion with Resistance  - 1 x daily - 7 x weekly - 3 sets - 10 reps  From CIR Therapist: Also instructed pt in 2 additional  exercises but no printed versions available: - tall kneeling to half kneeling - sit to half kneeling and then return to sit. Perform to each side.   Verbally added 02/21/24: -squats at countertop with chair behind you  GOALS: Goals reviewed with patient? Yes  SHORT TERM GOALS: Target date: 02/28/2024  Pt will improve FGA to 28/30 for decreased fall risk  Baseline: 21/30, 25/30 (5/13) Goal status: IN PROGRESS  2.  HiMat to be assessed at STG check and LTG updated  Baseline: 11/54 (5/13) Goal status: MET  3.  Pt will improve gait velocity to at least 3.4 ft/s for improved gait efficiency and return to PLOF Baseline: 3.0 ft/sec (eval), 3.31 ft/sec (5/13) Goal status: IN PROGRESS  4.  ABC scale to be assessed and LTG updated  Baseline: 70.6% (5/13) Goal status: MET  5.  Pt will demonstrate proper lifting technique of 25# weight from floor for proper body mechanics and safety at work Baseline:  Goal status: MET  LONG TERM GOALS: Target date: 03/13/2024   Pt will improve gait velocity to at least 3.5 ft/s for improved gait efficiency and independence Baseline: 3.0 ft/sec (eval), 3.31 ft/sec (5/13) Goal status: REVISED  2.  Pt will improve her score on the HiMat to >/= 16/54 to demonstrate improved balance and function Baseline: 11/54 (5/13) Goal status: INITIAL  3.  Pt will improve her ABC scale score to >/= 80% for improved confidence in her balance. Baseline: 70.6% (5/13) Goal status: INITIAL  4.  Pt will be able to carry 10# object in BUEs up/down 4 steps for improved coordination and safety in work environment   Baseline: can only carry w/single UE  Goal status: INITIAL  5.  Pt will perform [place] get up w/5# weight for improved shoulder stability OH, core stability and high level balance  Baseline:  Goal status: INITIAL   ASSESSMENT:  CLINICAL IMPRESSION: Emphasis of skilled PT session on increased gait speed, proximal stability and anticipatory balance  strategies. Pt reports increase in L foot pain while wearing Birkenstocks due to excessive eversion of L foot. Added resisted ankle inversion to HEP and advised pt to try some exercises from HEP without shoes on (carefully) to work on stabilization  of L ankle. Pt tolerated session well and is most motivated by desire to return to work. Pt continues to be limited by fatigue, extensor tone in LLE and proximal weakness but continues to progress towards her LTGs. Continue POC.    OBJECTIVE IMPAIRMENTS: Abnormal gait, decreased activity tolerance, decreased balance, decreased coordination, decreased endurance, decreased mobility, decreased strength, increased muscle spasms, impaired UE functional use, improper body mechanics, and pain  ACTIVITY LIMITATIONS: carrying, lifting, squatting, stairs, reach over head, hygiene/grooming, and locomotion level  PARTICIPATION LIMITATIONS: meal prep, cleaning, laundry, interpersonal relationship, driving, shopping, community activity, occupation, and yard work  PERSONAL FACTORS: 1 comorbidity: R internal capsule infarct/R basal ganglia stroke are also affecting patient's functional outcome.   REHAB POTENTIAL: Excellent  CLINICAL DECISION MAKING: Evolving/moderate complexity  EVALUATION COMPLEXITY: Moderate  PLAN:  PT FREQUENCY: 2-3x/week (starting w/2x following eval)  PT DURATION: 6 weeks  PLANNED INTERVENTIONS: 16109- PT Re-evaluation, 97750- Physical Performance Testing, 97110-Therapeutic exercises, 97530- Therapeutic activity, [id number]- Neuromuscular re-education, 97535- Self Care, [id number]- Manual therapy, [phone]- Gait training, [phone]- Canalith repositioning, [phone]- Aquatic Therapy, Patient/Family education, Balance training, Stair training, Taping, Dry Needling, Joint mobilization, Spinal mobilization, Vestibular training, and DME instructions  PLAN FOR NEXT SESSION:  Review HEP from CIR and update prn. High level agility, elliptical, L quad eccentric  strength, OH tasks. Practice w/plastic needles Sonia Durand has these) in oranges and pin the needle to targets? Cognitive dual tasking, needs to work on deep squats and return to standing from squats, agility drills, treadmill training with focus on increasing speed and potentially running? Half kneel, ladder drills,   Already scheduled additional 2x/week for 6 weeks for PT/OT, will recert at 5/29 visit   Camyra Vaeth E Nataly Pacifico, PT, DPT 03/05/2024, 8:58 AM

## 2024-03-05 NOTE — Therapy (Signed)
 OUTPATIENT OCCUPATIONAL THERAPY NEURO TREATMENT  Patient Name: Faith Jordan MRN: [id number] DOB:14-Dec-1995, 28 y.o., female Today's Date: 03/05/2024  PCP: Gabriel John, NP  REFERRING PROVIDER: Genetta Kenning, MD   END OF SESSION:  OT End of Session - 03/05/24 0935     Visit Number 8    Number of Visits 13   including eval   Date for OT Re-Evaluation 04/06/24    Authorization Type [employer] [facility]) 2025    OT Start Time [phone]    OT Stop Time 0930    OT Time Calculation (min) 43 min    Activity Tolerance Patient tolerated treatment well    Behavior During Therapy [facility] for tasks assessed/performed                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   Acute flank pain 06/02/2022   Angio-edema 04/27/2016   Angioedema    Aquagenic angio-edema-urticaria    Asthma    no problems /not used recently   Dysautonomia (HCC)    Dysrhythmia    sinus tachycardia   Fibroid    right breast, adenoma   Headache(784.0)    History of COVID-19 11/21/2020   Hives 12/22/2015   Pneumonia    hx  6th grade   Sensation of fullness in both ears 02/02/2023   Snoring 02/11/2022   Urinary frequency 02/12/2019   Vaginal yeast infection 06/02/2022   Vasculitis (HCC)    Past Surgical History:  Procedure Laterality Date   ADENOIDECTOMY     BREAST LUMPECTOMY Right    BREAST LUMPECTOMY Right    MASS EXCISION Right 10/07/2014   Procedure: EXCISION OF RIGHT BREAST MASS;  Surgeon: Adalberto Hollow, MD;  Location: [facility] OR;  Service: General;  Laterality: Right;   TONSILLECTOMY AND ADENOIDECTOMY     TOOTH EXTRACTION     TRANSESOPHAGEAL ECHOCARDIOGRAM (CATH LAB) N/A 01/10/2024   Procedure: TRANSESOPHAGEAL ECHOCARDIOGRAM;  Surgeon: Hugh Madura, MD;  Location: [facility] INVASIVE CV LAB;  Service: Cardiovascular;  Laterality: N/A;   Patient Active Problem List   Diagnosis Date Noted   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) 02/10/2024   Coping style affecting medical condition 01/19/2024   History of CVA (cerebrovascular accident) 01/12/2024   Stroke of right basal ganglia (HCC) 01/07/2024   Non-restorative sleep 05/26/2022   Sleep paralysis, 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   Vivid dream 05/26/2022   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   Excessive daytime sleepiness 05/26/2022   Sleep disturbance 03/17/2022   Panic attacks 02/11/2022   Vitamin B12 deficiency 07/30/2021   Vitamin D  deficiency 07/30/2021   Family history of hypothyroidism 07/30/2021   GAD (generalized anxiety disorder) 03/18/2020   Fatigue 11/06/2019   Chronic back pain 07/06/2019   Preventative health care 04/27/2018   Migraines 04/25/2018   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   Anemia 01/04/2018   Headache disorder 12/29/2015   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   Sinus tachycardia 12/22/2015   Dysautonomia (HCC) 12/22/2015   Dysautonomia, familial (HCC) 04/22/2015   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   Neurocardiogenic syncope 04/06/2013   Intermittent palpitations 04/06/2013    ONSET DATE: 01/26/2024 (referral date)  REFERRING DIAG: I63.81 (ICD-10-CM) - Other cerebral infarction due to occlusion or stenosis of small artery   THERAPY DIAG:  Other symptoms and signs involving the musculoskeletal system  Other symptoms and signs involving the nervous system  Other lack of coordination  Hemiplegia and hemiparesis following cerebral infarction affecting left non-dominant side (HCC)  Muscle weakness (generalized)  Rationale for Evaluation and Treatment: Rehabilitation  SUBJECTIVE:   SUBJECTIVE STATEMENT: Pronunciation: "Bree-Anne-ah"  Pt reported recent fall, see below. Pt reported no other updates. Pt reported hand is "a little slower" when e.g. trying to remove something from eye. Pt's mother reported pt is likely moving back to ind living situation (townhome) soon. Pt will go to  townhome this weekend or next weekend with supervision to set up environment.  Pt accompanied by: self and mother  PERTINENT HISTORY: familiar hemiplegic migraine, angioedema 2017,dysautonomia; infarct right internal capsule/ R basal ganglia stroke   MRI of brain on 01/07/24   IMPRESSION: 1. 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 2. Susceptibility related calcifications in the globus pallidus bilaterally. This likely reflects the sequela of chronic microvascular ischemia.  PRECAUTIONS: Fall, Per 02/10/24 MD Office Visit: Pt may gradually begin to return to driving.   WEIGHT BEARING RESTRICTIONS: No  PAIN:  Are you having pain? "Not really, foot has been irritating me, but not really painful."  FALLS: Has patient fallen in last 6 months? Yes when CVA event occurred.  03/05/24 - Pt reported recent fall after trying to get clothes out of dryer. Pt denied injuries and able to ind get up after fall.   LIVING ENVIRONMENT: Lives with: lives with their family Lives in: House/apartment Stairs: Yes: External: 4 steps; bilateral but cannot reach both Has following equipment at home: shower chair  PLOF: Independent, typically living ind, worked as Engineer, civil (consulting)  PATIENT GOALS: "I want to get my hand and arm back and be able to go to work."  OBJECTIVE:  Note: Objective measures were completed at Evaluation unless otherwise noted.  HAND DOMINANCE: Right  ADLs: Overall ADLs:  Transfers/ambulation related to ADLs: Eating: ind Grooming: difficulty with completing hair styling e.g. putting hair up in ponytail, difficulty with curling hair. Uses RUE to complete toothbrushing and brushing hair.  Difficulty with clipping fingernails. UB Dressing: ind, extra time. Buttons and zippers difficult.  LB Dressing: Ind, difficulty with pulling up pants and buttoning buttons on jeans, extra time for tying shoes Toileting: ind  Bathing: difficulty washing hair  d/t unable to apply pressure while moving LUE, typically uses RUE to ensure thoroughness, shower bench is available PRN Tub Shower transfers: walk-in shower, pt reported SOB after showering or when walking a lot, sometimes requires rest breaks Equipment: Shower seat with back and Walk in shower  IADLs: Shopping: assistance from parents but pt participates, such as trying to reach for items on shelf and pushing cart Light housekeeping: pt completes dishwashing, has not attempted laundry Meal Prep: pt reported cooking but task is tiring Community mobility: has attempted to drive 1x with parent  Handwriting: No change per pt.   Work activities: Pt expressed concerns with FM coordination and concerns about return to work considerations, such as coordination required for drawing up syringes from a vial for medication administration, completing IVs, typing, navigating phone.   MOBILITY STATUS: Independent  POSTURE COMMENTS:  Ind sitting balance, sometimes forward shoulder posture  ACTIVITY TOLERANCE: Activity tolerance: Pt reported "I'm doing more than I was doing in the hospital, but I definitely get tired easier and take more breaks."  FUNCTIONAL OUTCOME MEASURES: PSF: 3.0    Total score = sum of the activity scores/number of activities Minimum detectable change (90%CI) for average score = 2 points Minimum detectable change (90%CI) for single  activity score = 3 points   UPPER EXTREMITY ROM:    Active ROM Right eval Left eval  Shoulder flexion [facility]  Shoulder abduction  [facility]  Shoulder adduction    Shoulder extension    Shoulder internal rotation    Shoulder external rotation    Elbow flexion  WFL - slightly delayed movements  Elbow extension    Wrist flexion    Wrist extension    Wrist ulnar deviation    Wrist radial deviation    Wrist pronation    Wrist supination    (Blank rows = not tested)  OT palpated pt's L shoulder and noted "tightness" of muscles at L shoulder.    UPPER EXTREMITY MMT:     MMT Right eval Left eval  Shoulder flexion 5 3+   HAND FUNCTION: Grip strength: Right: 48.9, 53.5, 53.3 (51.9 lbs average) lbs; Left: 24.2, 22.2, 22.7 (23.1 lbs average) lbs  COORDINATION: 9 Hole Peg test: Right: 20 sec; Left: 40 sec  Pt reported practicing picking up pills, sorting change, and playing connect-4 at home.  SENSATION: Pt reported tingling in L thumb which has since resolves. Pt questioned if symptoms might have been from resting hand splint at night. Pt reported no other symptoms of numbness/tingling.  EDEMA: None noted  MUSCLE TONE: RUE: Within functional limits and LUE: slight ataxia  COGNITION: Overall cognitive status: Within functional limits for tasks assessed  Pt reported some difficulty with word-finding.  Pt reported sometimes asking same question again "a few hours apart." Pt's mother reported not noticing this recently.  VISION: Subjective report: Pt reported initial changes to vision (unable to read, blurry vision close-up and far away) though "okay now."  Baseline vision: No visual deficits Visual history: none noted  VISION ASSESSMENT: Pt accurately clock on wall and reported reading books on Kindle. Pt reported still using large print on phone since hospital.   Pt tracked easily to all 4 quadrants. Peripheral vision - WFL B sides, ?slightly decreased on L side though functional regardless  Patient has difficulty with following activities due to following visual impairments: None noted  PERCEPTION: Not tested  PRAXIS: Not tested  OBSERVATIONS: Pt ambulated ind. Pt soft-spoken and appeared tired during session. Pt was pleasant and appeared motivated to complete LUE functional tasks at home based on pt report.                                                                                                                             TREATMENT DATE:    Self-Care OT educated pt on UE anatomy, dx, prognosis, body  mechanics, ergonomic principles, strategies to improve steadiness of balance and gait during functional ADL/IADL tasks. Pt acknowledged understanding of all.   Standing with gait belt - Simulated removing items from dryer - to improve attention to body mechanics/ergonomic principles, to reduce fall risk, to improve safety during IADLS, to improve endurance and activity tolerance. Pt returned demo of safe body  mechanics with fading v/c and therapist modeling.  OT and pt discussed A/E options: e.g. rolling laundry basket, standard laundry basket, collapsible stool, laundry bag or backpack for carrying items upstairs. OT showed examples online PRN. Pt acknowledged understanding of all.   TherAct Standing with gait belt - Placing large pegs on pegboard at upright vertical surface with LUE, removing large pegs 3-4 pegs at a time, reaching across midline to retrieve/replace pegs  - to improve dynamic standing balance, to improve LUE in-hand manipulation, to improve LUE ROM, overhead reach, strengthening, and endurance. Pt tolerated task well though reported fatigue of LUE.  Pendulums with LUE intermittently - to reduce fatigue. Pt returned demo.      PATIENT EDUCATION: Education details: see today's tx above Person educated: Patient Education method: Explanation, Demonstration, and Handouts Education comprehension: verbalized understanding  HOME EXERCISE PROGRAM: 02/09/24 - Affected UE ROM and Yellow Theraputty. Access Code: [email or web address]. 02/15/2024: L handed typing words 02/21/24 - Digit ext with theraputty or rubber band (access code: [email or web address]). Functional reach: Handwritten/Verbal instructions: 1 set in front of mirror, 1 set without mirror: Reaching overhead to touch top of head with affected hand, reaching overhead to touch back of head with affected hand, reaching arm forward and touching nose with affected UE - 10 reps, 2 sets, 2x per day.  GOALS: Goals reviewed with patient? Yes  SHORT TERM  GOALS: Target date: 03/09/24  Pt will ind demo understanding of HEP using visual handouts. Baseline: new to outpt OT Goal status: in progress  2.  Pt will ind recall at least 3 energy conservation strategies. Baseline: Activity tolerance: Pt reported "I'm doing more than I was doing in the hospital, but I definitely get tired easier and take more breaks."  Goal status: in progress  3.  Pt and/or family will return demo of personal e-stim unit use and electrode placement for affected UE.  Baseline: pt reported she has ordered a personal e-stim unit.  02/09/24 - Education and handout provided. Pt returned demo of using personal e-stim unit for wrist flex/ext of affected UE. Goal status: MET  4.  Pt will report improved ability to use LUE during overhead reaching tasks with minimal use of RUE to compensate, such as hair care tasks and washing hair. Baseline: pt reported difficulty with hair styling (e.g. putting hair in ponytail, curling hair) Goal status: in progress  LONG TERM GOALS: Target date: 04/06/24  Patient will report at least two-point increase in average PSFS score or at least three-point increase in a single activity score indicating functionally significant improvement given minimum detectable change using adaptive strategies and A/E PRN. Baseline: PSF: 3.0 total score (See above for individual activity scores)  Goal status: in progress  2.  Patient will demonstrate at least 30 lbs LUE grip strength as needed to open jars and other containers.  Baseline: Grip strength: Right: 48.9, 53.5, 53.3 (51.9 lbs average) lbs; Left: 24.2, 22.2, 22.7 (23.1 lbs average) lbs Goal status: in progress  3.  Patient will demo improved FM coordination as evidenced by completing nine-hole peg with use of LUE in 30 seconds or less.  Baseline: 9 Hole Peg test: Right: 20 sec; Left: 40 sec Goal status: in progress  4.  Pt will verbalize understanding of A/E, adaptive strategies for functional tasks  as needed for return to work considerations. Baseline: Work activities: Pt expressed concerns with FM coordination and concerns about return to work considerations, such as coordination required for drawing up syringes from a vial for  medication administration, completing IVs, typing, navigating phone.  Goal status: in progress  ASSESSMENT:  CLINICAL IMPRESSION: Pt tolerated tasks well. Pt reported fatigue of LUE and LLE during functional tasks though pt willing to participate in all tasks to completion. Pt would continue to benefit from review of body mechanics/ergonomic principles during functional tasks with A/E PRN to improve safety and reduce fall risk. Pt continuing to progress towards goals. OT to monitor and provide recommendations as pt prepares to return to ind living environment. Pt would benefit from skilled OT services to address deficits, increase overall independence, and return patient to PLOF as able.   PERFORMANCE DEFICITS: in functional skills including ADLs, IADLs, coordination, dexterity, proprioception, sensation, tone, ROM, strength, pain, muscle spasms, flexibility, Fine motor control, Gross motor control, mobility, balance, endurance, and UE functional use, cognitive skills including attention and energy/drive, and psychosocial skills including environmental adaptation.   IMPAIRMENTS: are limiting patient from ADLs, IADLs, rest and sleep, work, leisure, and social participation.   CO-MORBIDITIES: may have co-morbidities  that affects occupational performance. Patient will benefit from skilled OT to address above impairments and improve overall function.  REHAB POTENTIAL: Good  PLAN:  OT FREQUENCY: 2x/week  OT DURATION: 6 weeks (dates extended to allow for scheduling)  PLANNED INTERVENTIONS: 97168 OT Re-evaluation, 97535 self care/ADL training, 40981 therapeutic exercise, 97530 therapeutic activity, 97112 neuromuscular re-education, 97140 manual therapy, 97035  ultrasound, 97018 paraffin, 19147 fluidotherapy, 97010 moist heat, 97010 cryotherapy, 97760 Orthotic Initial, 97761 Prosthetic Initial, 97763 Orthotic/Prosthetic subsequent, passive range of motion, functional mobility training, visual/perceptual remediation/compensation, energy conservation, patient/family education, and DME and/or AE instructions  RECOMMENDED OTHER SERVICES: PT eval completed, SLP eval scheduled  CONSULTED AND AGREED WITH PLAN OF CARE: Patient and family member  PLAN FOR NEXT SESSION:  Functional use of LUE: e.g. clipping nails, putting hair in ponytail, grooming, navigating phone, typing, see PSFS items  Review ROM and theraputty HEP PRN FM coordination and functional reaching activities - e.g. Blaze Pods, small pegs in-hand manipulation, thumb ROM Overhead strengthening - e.g. Squigz at [employer], Pegs at upright mirror, Ball at wall [employer])  Oakley Bellman, OT 03/05/2024, 9:42 AM

## 2024-03-07 ENCOUNTER — Other Ambulatory Visit (HOSPITAL_COMMUNITY): Payer: Self-pay

## 2024-03-07 ENCOUNTER — Ambulatory Visit: Admitting: Occupational Therapy

## 2024-03-07 ENCOUNTER — Ambulatory Visit: Admitting: Physical Therapy

## 2024-03-07 VITALS — BP 110/75 | HR 83

## 2024-03-07 DIAGNOSIS — R2681 Unsteadiness on feet: Secondary | ICD-10-CM

## 2024-03-07 DIAGNOSIS — I69354 Hemiplegia and hemiparesis following cerebral infarction affecting left non-dominant side: Secondary | ICD-10-CM

## 2024-03-07 DIAGNOSIS — R29818 Other symptoms and signs involving the nervous system: Secondary | ICD-10-CM

## 2024-03-07 DIAGNOSIS — M6281 Muscle weakness (generalized): Secondary | ICD-10-CM

## 2024-03-07 DIAGNOSIS — R2689 Other abnormalities of gait and mobility: Secondary | ICD-10-CM

## 2024-03-07 DIAGNOSIS — R29898 Other symptoms and signs involving the musculoskeletal system: Secondary | ICD-10-CM

## 2024-03-07 DIAGNOSIS — R278 Other lack of coordination: Secondary | ICD-10-CM

## 2024-03-07 NOTE — Patient Instructions (Signed)
 Upper Body Strengthening Exercises Comments Sit upright, away from the back of your chair. Keep abdominal muscles engaged i.e. pull belly button to spine.  FOR ALL EXERCISES: Repeat 10 Times  Hold 3 Seconds  Complete 1 Set  Perform 2 Times a Day  ELASTIC BAND FLEXION   Place unaffected arm on your leg or hip. With your affected arm, hold elastic band in front of you and pull the band upward towards the ceiling as shown.    ELASTIC BAND HORIZONTAL ABDUCTION - SCAPULAR RETRACTION  Start by holding elastic band in front of your chest with your elbows straight. Then, pull your arms apart and towards the side while squeezing your shoulder blades together. Return to starting position and repeat.             ELASTIC BAND TRICEPS EXTENSION  While seated, hold and fixate one end of an elastic band against your chest. Hold the other end with your opposite hand with your elbow bent and arm by your side.   Start by pulling the band downward so that the elbow goes from a bent position to a straightened position as shown. Return to starting position and repeat.   BICEPS CURL WITH BAND  While sitting in an upright position, put an elastic band underneath your feet (as pictured)  OR underneath your thighs. Grip each end of the band, keep the elbows tucked to the body, and bring hands to shoulders by bending at the elbows.

## 2024-03-07 NOTE — Therapy (Signed)
 OUTPATIENT PHYSICAL THERAPY NEURO TREATMENT   Patient Name: Faith Jordan MRN: [id number] DOB:11-Nov-1995, 28 y.o., female Today's Date: 03/07/2024   PCP: Gabriel John, NP REFERRING PROVIDER: Genetta Kenning, MD     END OF SESSION:  PT End of Session - 03/07/24 0852     Visit Number 11    Number of Visits 13    Date for PT Re-Evaluation 03/20/24    Authorization Type [employer]    PT Start Time [phone]    PT Stop Time 0930    PT Time Calculation (min) 40 min    Equipment Utilized During Treatment Gait belt    Activity Tolerance Patient tolerated treatment well    Behavior During Therapy [facility] for tasks assessed/performed                    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   Acute flank pain 06/02/2022   Angio-edema 04/27/2016   Angioedema    Aquagenic angio-edema-urticaria    Asthma    no problems /not used recently   Dysautonomia (HCC)    Dysrhythmia    sinus tachycardia   Fibroid    right breast, adenoma   Headache(784.0)    History of COVID-19 11/21/2020   Hives 12/22/2015   Pneumonia    hx  6th grade   Sensation of fullness in both ears 02/02/2023   Snoring 02/11/2022   Urinary frequency 02/12/2019   Vaginal yeast infection 06/02/2022   Vasculitis (HCC)    Past Surgical History:  Procedure Laterality Date   ADENOIDECTOMY     BREAST LUMPECTOMY Right    BREAST LUMPECTOMY Right    MASS EXCISION Right 10/07/2014   Procedure: EXCISION OF RIGHT BREAST MASS;  Surgeon: Adalberto Hollow, MD;  Location: [facility] OR;  Service: General;  Laterality: Right;   TONSILLECTOMY AND ADENOIDECTOMY     TOOTH EXTRACTION     TRANSESOPHAGEAL ECHOCARDIOGRAM (CATH LAB) N/A 01/10/2024   Procedure: TRANSESOPHAGEAL ECHOCARDIOGRAM;  Surgeon: Hugh Madura, MD;  Location: [facility] INVASIVE CV LAB;  Service: Cardiovascular;  Laterality: N/A;   Patient Active Problem List   Diagnosis Date Noted   Spastic hemiparesis  of left nondominant side due to acute cerebral infarction (HCC) 02/10/2024   Coping style affecting medical condition 01/19/2024   History of CVA (cerebrovascular accident) 01/12/2024   Stroke of right basal ganglia (HCC) 01/07/2024   Non-restorative sleep 05/26/2022   Sleep paralysis, 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   Vivid dream 05/26/2022   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   Excessive daytime sleepiness 05/26/2022   Sleep disturbance 03/17/2022   Panic attacks 02/11/2022   Vitamin B12 deficiency 07/30/2021   Vitamin D  deficiency 07/30/2021   Family history of hypothyroidism 07/30/2021   GAD (generalized anxiety disorder) 03/18/2020   Fatigue 11/06/2019   Chronic back pain 07/06/2019   Preventative health care 04/27/2018   Migraines 04/25/2018   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   Anemia 01/04/2018   Headache disorder 12/29/2015   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   Sinus tachycardia 12/22/2015   Dysautonomia (HCC) 12/22/2015   Dysautonomia, familial (HCC) 04/22/2015   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   Neurocardiogenic syncope 04/06/2013   Intermittent palpitations 04/06/2013    ONSET DATE: 01/26/2024 (referral)   REFERRING DIAG:  I63.81 (ICD-10-CM) - 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 Pt reports that her L ankle was a little uncomfortable when she woke up this  morning but she is not really having any pain. Pt working towards moving back into her apartment, was hoping for this weekend but does not have sturdy rail on her stairs - waiting on dad to fix it.  Pt reports she plans to order her groceries online and pick them up at the store, worried about being able to load/unload them from the car. She reports that getting back to driving has been okay, her L arm does feel heavy at times. Pt also reports she would not necessarily have to carry her laundry up/down stairs as she doesn't have a laundry basket but could carry small piles of folded clothes up/down.  Pt still worried about returning to work, currently on short term disability. She still needs to take a nap every day. Pt interested in returning to the gym as well.  Pt accompanied by: Self-drove herself this morning   PERTINENT HISTORY: familiar hemiplegic migraine, angioedema 2017,dysautonomia; infarct right internal capsule/ R basal ganglia stroke  PAIN:  Are you having pain? No    PRECAUTIONS: Fall  RED FLAGS: None   WEIGHT BEARING RESTRICTIONS: No  FALLS: Has patient fallen in last 6 months? No  LIVING ENVIRONMENT: Lives with: lives with their family Lives in: House/apartment Stairs: Yes: External: 4 steps; bilateral but cannot reach both Has following equipment at home: shower chair  PLOF: Independent  PATIENT GOALS: "Being normal again. Being able to go back to work and be independent"   OBJECTIVE:  Note: Objective measures were completed at Evaluation unless otherwise noted.  DIAGNOSTIC FINDINGS: MRI of brain on 01/07/24  IMPRESSION: 1. 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 2. Susceptibility related calcifications in the globus pallidus bilaterally. This likely reflects the sequela of chronic microvascular ischemia.  COGNITION: Overall cognitive status: Within functional limits for tasks assessed   SENSATION: Pt  denies numbness/tingling in L hemibody   MUSCLE TONE: Non-fatiguing clonus of LLE  POSTURE: No Significant postural limitations  LOWER EXTREMITY ROM:     Active  Right Eval  Left Eval  Hip flexion    Hip extension    Hip abduction    Hip adduction    Hip internal rotation    Hip external rotation    Knee flexion    Knee extension    Ankle dorsiflexion    Ankle plantarflexion    Ankle inversion    Ankle eversion     (Blank rows = not tested)  LOWER EXTREMITY MMT:  Tested in seated position   MMT Right Eval Left Eval  Hip flexion 5 4+  Hip extension    Hip abduction 5 5  Hip adduction 5 5  Hip internal rotation    Hip external rotation    Knee flexion 5 5  Knee extension 5 4+  Ankle dorsiflexion 5 5  Ankle plantarflexion    Ankle inversion    Ankle eversion    (Blank rows = not tested)  BED MOBILITY:  Not tested  TRANSFERS: Sit to stand: Complete Independence  Assistive device utilized: None     Stand to sit: Complete Independence  Assistive device utilized: None      RAMP:  Not tested  CURB:  Not tested  GAIT: Gait pattern: step through pattern, decreased arm swing- Left, decreased step length- Right, decreased stance time- Left, decreased stride length, decreased hip/knee flexion- Left, lateral hip instability, and wide BOS Distance walked: Various clinic distances  Assistive device utilized: None Level of assistance: Modified independence Comments: Pt walks very guarded, as if she feels unstable. No instability noted other than single instance of L knee buckling which pt able to self-correct.    PATIENT SURVEYS:  ABC scale to be obtained    VITALS  Vitals:   03/07/24 0900  BP: 110/75  Pulse: 83                                                                                                                               TREATMENT:    Self-Care Assessed vitals (see above) and BP on lower end but WNL for session.   TherEx To work on LE  strengthening and practicing on gym equipment that pt has access to at her gym: Leg press with BLE 80# x 10 reps 100# 2 x 10 reps Leg press with LLE only 60# x 5 reps 50# 2 x 10 reps Cues to perform slow, controlled movements throughout full ROM avoiding locking out her knee  To work on carrying weight with BUE: Carrying 12# KB RUE and 10# LUE with gait x 115 ft and then up/down stairs x 6 reps Does have onset of R lateral lean when ascending either due to LOB or LLE weakness but improves with increased repetitions Carrying 20# weighted crate up/down stairs x 3 reps Regression to 10# weighted crate due to difficulty maneuvering safely with weights sliding around basket     PATIENT EDUCATION: Education details: continue HEP, review of LE strengthening machines at gym and how to  safely use them, barriers to safe return home Person educated: Patient Education method: Explanation and Demonstration Education comprehension: verbalized understanding and needs further education  HOME EXERCISE PROGRAM: Access Code: [email or web address] URL: [email or web address] Date: 02/13/2024 Prepared by: Burleigh Carp Plaster  Exercises - Half Kneeling Lift  - 1 x daily - 7 x weekly - 1 sets - 10 reps - Down Dog with Bent Knees and Heels on Mat  - 1 x daily - 7 x weekly - 2-3 reps - 15-30 seconds hold - Bird Dog  - 1 x daily - 7 x weekly - 3 sets - 10 reps - Lateral Shuffles  - 1 x daily - 7 x weekly - 3 sets - 10 reps - Braided Sidestepping  - 1 x daily - 7 x weekly - 3 sets - 10 reps - Standing Balance Activity: Plyometric Modified Lower Extremity Jumping Jack  - 1 x daily - 7 x weekly - 3 sets - 10 reps - Standing Forward Step Taps with Counter Support  - 1 x daily - 7 x weekly - 3 sets - 10 reps (at bottom of steps with no UE support, red TB around ankles) - Ankle Inversion with Resistance  - 1 x daily - 7 x weekly - 3 sets - 10 reps  From CIR Therapist: Also instructed pt in 2 additional exercises  but no printed versions available: - tall kneeling to half kneeling - sit to half kneeling and then return to sit. Perform to each side.   Verbally added 02/21/24: -squats at countertop with chair behind you  GOALS: Goals reviewed with patient? Yes  SHORT TERM GOALS: Target date: 02/28/2024  Pt will improve FGA to 28/30 for decreased fall risk  Baseline: 21/30, 25/30 (5/13) Goal status: IN PROGRESS  2.  HiMat to be assessed at STG check and LTG updated  Baseline: 11/54 (5/13) Goal status: MET  3.  Pt will improve gait velocity to at least 3.4 ft/s for improved gait efficiency and return to PLOF Baseline: 3.0 ft/sec (eval), 3.31 ft/sec (5/13) Goal status: IN PROGRESS  4.  ABC scale to be assessed and LTG updated  Baseline: 70.6% (5/13) Goal status: MET  5.  Pt will demonstrate proper lifting technique of 25# weight from floor for proper body mechanics and safety at work Baseline:  Goal status: MET  LONG TERM GOALS: Target date: 03/13/2024   Pt will improve gait velocity to at least 3.5 ft/s for improved gait efficiency and independence Baseline: 3.0 ft/sec (eval), 3.31 ft/sec (5/13) Goal status: REVISED  2.  Pt will improve her score on the HiMat to >/= 16/54 to demonstrate improved balance and function Baseline: 11/54 (5/13) Goal status: INITIAL  3.  Pt will improve her ABC scale score to >/= 80% for improved confidence in her balance. Baseline: 70.6% (5/13) Goal status: INITIAL  4.  Pt will be able to carry 10# object in BUEs up/down 4 steps for improved coordination and safety in work environment   Baseline: can only carry w/single UE  Goal status: INITIAL  5.  Pt will perform [place] get up w/5# weight for improved shoulder stability OH, core stability and high level balance  Baseline:  Goal status: INITIAL   ASSESSMENT:  CLINICAL IMPRESSION: Emphasis of skilled PT session on working on strengthening with use of leg press and discussing how to safely use this  equipment at the gym as well as other LE strengthening machines available at the gym. Also practiced carrying weight while walking up/down  stairs to assess safety with return to functional tasks patient would need to be able to perform in her home. Pt noted to have mild imbalance initially when ascending stairs but exhibits improved performance as task progresses. She continues to benefit from skilled PT services to work towards return to PLOF. Continue POC.    OBJECTIVE IMPAIRMENTS: Abnormal gait, decreased activity tolerance, decreased balance, decreased coordination, decreased endurance, decreased mobility, decreased strength, increased muscle spasms, impaired UE functional use, improper body mechanics, and pain  ACTIVITY LIMITATIONS: carrying, lifting, squatting, stairs, reach over head, hygiene/grooming, and locomotion level  PARTICIPATION LIMITATIONS: meal prep, cleaning, laundry, interpersonal relationship, driving, shopping, community activity, occupation, and yard work  PERSONAL FACTORS: 1 comorbidity: R internal capsule infarct/R basal ganglia stroke are also affecting patient's functional outcome.   REHAB POTENTIAL: Excellent  CLINICAL DECISION MAKING: Evolving/moderate complexity  EVALUATION COMPLEXITY: Moderate  PLAN:  PT FREQUENCY: 2-3x/week (starting w/2x following eval)  PT DURATION: 6 weeks  PLANNED INTERVENTIONS: 21308- PT Re-evaluation, 97750- Physical Performance Testing, 97110-Therapeutic exercises, 97530- Therapeutic activity, [id number]- Neuromuscular re-education, 97535- Self Care, [id number]- Manual therapy, [phone]- Gait training, [phone]- Canalith repositioning, [phone]- Aquatic Therapy, Patient/Family education, Balance training, Stair training, Taping, Dry Needling, Joint mobilization, Spinal mobilization, Vestibular training, and DME instructions  PLAN FOR NEXT SESSION:  Review HEP from CIR and update prn. High level agility, elliptical, L quad eccentric strength, OH tasks.  Practice w/plastic needles Sonia Durand has these) in oranges and pin the needle to targets? Cognitive dual tasking, needs to work on deep squats and return to standing from squats, agility drills, treadmill training with focus on increasing speed and potentially running? Half kneel, ladder drills, carrying weight with both hands (on rockerboard, stairs)  Already scheduled additional 2x/week for 6 weeks for PT/OT, will recert at 5/29 visit   Lorita Rosa, PT Lorita Rosa, PT, DPT, CSRS  03/07/2024, 9:31 AM

## 2024-03-07 NOTE — Therapy (Signed)
 OUTPATIENT OCCUPATIONAL THERAPY NEURO TREATMENT  Patient Name: Faith Jordan MRN: [id number] DOB:[birth year], 28 y.o., female Today's Date: 03/07/2024  PCP: Gabriel John, NP  REFERRING PROVIDER: Genetta Kenning, MD   END OF SESSION:  OT End of Session - 03/07/24 0935     Visit Number 9    Number of Visits 13   including eval   Date for OT Re-Evaluation 04/06/24    Authorization Type [employer] [facility]) 2025    OT Start Time [phone]    OT Stop Time 1015    OT Time Calculation (min) 41 min    Activity Tolerance Patient tolerated treatment well    Behavior During Therapy [facility] for tasks assessed/performed            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   Acute flank pain 06/02/2022   Angio-edema 04/27/2016   Angioedema    Aquagenic angio-edema-urticaria    Asthma    no problems /not used recently   Dysautonomia (HCC)    Dysrhythmia    sinus tachycardia   Fibroid    right breast, adenoma   Headache(784.0)    History of COVID-19 11/21/2020   Hives 12/22/2015   Pneumonia    hx  6th grade   Sensation of fullness in both ears 02/02/2023   Snoring 02/11/2022   Urinary frequency 02/12/2019   Vaginal yeast infection 06/02/2022   Vasculitis (HCC)    Past Surgical History:  Procedure Laterality Date   ADENOIDECTOMY     BREAST LUMPECTOMY Right    BREAST LUMPECTOMY Right    MASS EXCISION Right 10/07/2014   Procedure: EXCISION OF RIGHT BREAST MASS;  Surgeon: Adalberto Hollow, MD;  Location: [facility] OR;  Service: General;  Laterality: Right;   TONSILLECTOMY AND ADENOIDECTOMY     TOOTH EXTRACTION     TRANSESOPHAGEAL ECHOCARDIOGRAM (CATH LAB) N/A 01/10/2024   Procedure: TRANSESOPHAGEAL ECHOCARDIOGRAM;  Surgeon: Hugh Madura, MD;  Location: [facility] INVASIVE CV LAB;  Service: Cardiovascular;  Laterality: N/A;   Patient Active Problem List   Diagnosis Date Noted   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) 02/10/2024   Coping style affecting medical condition 01/19/2024   History of CVA (cerebrovascular accident) 01/12/2024   Stroke of right basal ganglia (HCC) 01/07/2024   Non-restorative sleep 05/26/2022   Sleep paralysis, 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   Vivid dream 05/26/2022   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   Excessive daytime sleepiness 05/26/2022   Sleep disturbance 03/17/2022   Panic attacks 02/11/2022   Vitamin B12 deficiency 07/30/2021   Vitamin D  deficiency 07/30/2021   Family history of hypothyroidism 07/30/2021   GAD (generalized anxiety disorder) 03/18/2020   Fatigue 11/06/2019   Chronic back pain 07/06/2019   Preventative health care 04/27/2018   Migraines 04/25/2018   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   Anemia 01/04/2018   Headache disorder 12/29/2015   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   Sinus tachycardia 12/22/2015   Dysautonomia (HCC) 12/22/2015   Dysautonomia, familial (HCC) 04/22/2015   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   Neurocardiogenic syncope 04/06/2013   Intermittent palpitations 04/06/2013    ONSET DATE: 01/26/2024 (referral date)  REFERRING DIAG: I63.81 (ICD-10-CM) - Other cerebral infarction due to occlusion or stenosis of small artery   THERAPY DIAG:  Other symptoms and signs involving the musculoskeletal system  Other symptoms and signs involving the nervous system  Other lack of coordination  Hemiplegia and hemiparesis  following cerebral infarction affecting left non-dominant side (HCC)  Muscle weakness (generalized)  Rationale for Evaluation and Treatment: Rehabilitation  SUBJECTIVE:   SUBJECTIVE STATEMENT: Pronunciation: "Bree-Anne-ah"  Pt reported difficulty using her L thumb without cramping and with tendency for flex/adduction.She has been trying to use it with her phone. Pt is discouraged by her L grip strength today.   Pt accompanied by: self   PERTINENT HISTORY: familiar  hemiplegic migraine, angioedema 2017,dysautonomia; infarct right internal capsule/ R basal ganglia stroke   MRI of brain on 01/07/24   IMPRESSION: 1. 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 2. Susceptibility related calcifications in the globus pallidus bilaterally. This likely reflects the sequela of chronic microvascular ischemia.  PRECAUTIONS: Fall, Per 02/10/24 MD Office Visit: Pt may gradually begin to return to driving.   WEIGHT BEARING RESTRICTIONS: No  PAIN:  Are you having pain? No  FALLS: Has patient fallen in last 6 months? Yes when CVA event occurred.  03/05/24 - Pt reported recent fall after trying to get clothes out of dryer. Pt denied injuries and able to ind get up after fall.   LIVING ENVIRONMENT: Lives with: lives with their family Lives in: House/apartment Stairs: Yes: External: 4 steps; bilateral but cannot reach both Has following equipment at home: shower chair  PLOF: Independent, typically living ind, worked as Engineer, civil (consulting)  PATIENT GOALS: "I want to get my hand and arm back and be able to go to work."  OBJECTIVE:  Note: Objective measures were completed at Evaluation unless otherwise noted.  HAND DOMINANCE: Right  ADLs: Overall ADLs:  Transfers/ambulation related to ADLs: Eating: ind Grooming: difficulty with completing hair styling e.g. putting hair up in ponytail, difficulty with curling hair. Uses RUE to complete toothbrushing and brushing hair.  Difficulty with clipping fingernails. UB Dressing: ind, extra time. Buttons and zippers difficult.  LB Dressing: Ind, difficulty with pulling up pants and buttoning buttons on jeans, extra time for tying shoes Toileting: ind  Bathing: difficulty washing hair d/t unable to apply pressure while moving LUE, typically uses RUE to ensure thoroughness, shower bench is available PRN Tub Shower transfers: walk-in shower, pt reported SOB after showering or when walking a  lot, sometimes requires rest breaks Equipment: Shower seat with back and Walk in shower  IADLs: Shopping: assistance from parents but pt participates, such as trying to reach for items on shelf and pushing cart Light housekeeping: pt completes dishwashing, has not attempted laundry Meal Prep: pt reported cooking but task is tiring Community mobility: has attempted to drive 1x with parent  Handwriting: No change per pt.   Work activities: Pt expressed concerns with FM coordination and concerns about return to work considerations, such as coordination required for drawing up syringes from a vial for medication administration, completing IVs, typing, navigating phone.   MOBILITY STATUS: Independent  POSTURE COMMENTS:  Ind sitting balance, sometimes forward shoulder posture  ACTIVITY TOLERANCE: Activity tolerance: Pt reported "I'm doing more than I was doing in the hospital, but I definitely get tired easier and take more breaks."  FUNCTIONAL OUTCOME MEASURES: PSF: 3.0    Total score = sum of the activity scores/number of activities Minimum detectable change (90%CI) for average score = 2 points Minimum detectable change (90%CI) for single activity score = 3 points   UPPER EXTREMITY ROM:    Active ROM Right eval Left eval  Shoulder flexion [facility]  Shoulder abduction  [facility]  Shoulder adduction    Shoulder extension  Shoulder internal rotation    Shoulder external rotation    Elbow flexion  WFL - slightly delayed movements  Elbow extension    Wrist flexion    Wrist extension    Wrist ulnar deviation    Wrist radial deviation    Wrist pronation    Wrist supination    (Blank rows = not tested)  OT palpated pt's L shoulder and noted "tightness" of muscles at L shoulder.   UPPER EXTREMITY MMT:     MMT Right eval Left eval  Shoulder flexion 5 3+   HAND FUNCTION: Grip strength: Right: 48.9, 53.5, 53.3 (51.9 lbs average) lbs; Left: 24.2, 22.2, 22.7 (23.1 lbs average)  lbs  COORDINATION: 9 Hole Peg test: Right: 20 sec; Left: 40 sec  Pt reported practicing picking up pills, sorting change, and playing connect-4 at home.  SENSATION: Pt reported tingling in L thumb which has since resolves. Pt questioned if symptoms might have been from resting hand splint at night. Pt reported no other symptoms of numbness/tingling.  EDEMA: None noted  MUSCLE TONE: RUE: Within functional limits and LUE: slight ataxia  COGNITION: Overall cognitive status: Within functional limits for tasks assessed  Pt reported some difficulty with word-finding.  Pt reported sometimes asking same question again "a few hours apart." Pt's mother reported not noticing this recently.  VISION: Subjective report: Pt reported initial changes to vision (unable to read, blurry vision close-up and far away) though "okay now."  Baseline vision: No visual deficits Visual history: none noted  VISION ASSESSMENT: Pt accurately clock on wall and reported reading books on Kindle. Pt reported still using large print on phone since hospital.   Pt tracked easily to all 4 quadrants. Peripheral vision - WFL B sides, ?slightly decreased on L side though functional regardless  Patient has difficulty with following activities due to following visual impairments: None noted  PERCEPTION: Not tested  PRAXIS: Not tested  OBSERVATIONS: Pt ambulated ind. Pt soft-spoken and appeared tired during session. Pt was pleasant and appeared motivated to complete LUE functional tasks at home based on pt report.                                                                                                                             TREATMENT DATE:    Self-Care OT educated pt on Kinesio tape to L thumb using mechanical technique with 40-60% stretch. Multiple layers applied dorsally to promote extension and abduction. Piece placed at palmar base of thumb and wrapped around dorsally to support thumb at MCP to prevent  hyperextension. Pt verbalized understanding of tape and wearing schedule with application on affected extremity.    Therapist reviewed goals with patient and updated patient progression.  Objective measures assessed as noted in Goals section to determine progression towards goals.    - Therapeutic exercises completed for duration as noted below including:  OT initiated LUE green Thera-Band HEP including shoulder flexion (supine), horizontal abd/add (supine), bicep  curl, and tricep extension to promote strengthening of affected extremity and overall endurance as noted in pt instructions.  Pt able to return demonstration of each exercise x5 each. OT initiated dowel AAROM HEP including shoulder flex, ER, and chest press while in supine for improved ROM of affected extremity. Pt able to return demonstration of each exercise x5 each. Handout provided.  PATIENT EDUCATION: Education details: see today's tx above Person educated: Patient Education method: Explanation, Demonstration, Verbal cues, and Handouts Education comprehension: verbalized understanding, returned demonstration, verbal cues required, and needs further education  HOME EXERCISE PROGRAM: 02/09/24 - Affected UE ROM and Yellow Theraputty. Access Code: [email or web address]. 02/15/2024: L handed typing words 02/21/24 - Digit ext with theraputty or rubber band (access code: [email or web address]). Functional reach: Handwritten/Verbal instructions: 1 set in front of mirror, 1 set without mirror: Reaching overhead to touch top of head with affected hand, reaching overhead to touch back of head with affected hand, reaching arm forward and touching nose with affected UE - 10 reps, 2 sets, 2x per day. 03/07/2024: Theraband; dowel  GOALS: Goals reviewed with patient? Yes  SHORT TERM GOALS: Target date: 03/09/24  Pt will ind demo understanding of HEP using visual handouts. Baseline: new to outpt OT Goal status: in progress  2.  Pt will ind recall at least 3 energy  conservation strategies. Baseline: Activity tolerance: Pt reported "I'm doing more than I was doing in the hospital, but I definitely get tired easier and take more breaks."  Goal status: in progress  3.  Pt and/or family will return demo of personal e-stim unit use and electrode placement for affected UE.  Baseline: pt reported she has ordered a personal e-stim unit.  02/09/24 - Education and handout provided. Pt returned demo of using personal e-stim unit for wrist flex/ext of affected UE. Goal status: MET  4.  Pt will report improved ability to use LUE during overhead reaching tasks with minimal use of RUE to compensate, such as hair care tasks and washing hair. Baseline: pt reported difficulty with hair styling (e.g. putting hair in ponytail, curling hair) Goal status: in progress  LONG TERM GOALS: Target date: 04/06/24  Patient will report at least two-point increase in average PSFS score or at least three-point increase in a single activity score indicating functionally significant improvement given minimum detectable change using adaptive strategies and A/E PRN. Baseline: PSF: 3.0 total score (See above for individual activity scores)  Goal status: in progress  2.  Patient will demonstrate at least 30 lbs LUE grip strength as needed to open jars and other containers.  Baseline: Grip strength: Right: 48.9, 53.5, 53.3 (51.9 lbs average) lbs; Left: 24.2, 22.2, 22.7 (23.1 lbs average) lbs 03/07/2024: 33.1 lbs Goal status: in progress  3.  Patient will demo improved FM coordination as evidenced by completing nine-hole peg with use of LUE in 30 seconds or less.  Baseline: 9 Hole Peg test: Right: 20 sec; Left: 40 sec 03/07/2024: 34 sec Goal status: in progress  4.  Pt will verbalize understanding of A/E, adaptive strategies for functional tasks as needed for return to work considerations. Baseline: Work activities: Pt expressed concerns with FM coordination and concerns about return to work  considerations, such as coordination required for drawing up syringes from a vial for medication administration, completing IVs, typing, navigating phone.  Goal status: in progress  ASSESSMENT:  CLINICAL IMPRESSION: Pt demonstrated progression towards goals with good understanding of HEP as initiated today. Required cues for postural correction and motor control  with LUE movements.   PERFORMANCE DEFICITS: in functional skills including ADLs, IADLs, coordination, dexterity, proprioception, sensation, tone, ROM, strength, pain, muscle spasms, flexibility, Fine motor control, Gross motor control, mobility, balance, endurance, and UE functional use, cognitive skills including attention and energy/drive, and psychosocial skills including environmental adaptation.   IMPAIRMENTS: are limiting patient from ADLs, IADLs, rest and sleep, work, leisure, and social participation.   CO-MORBIDITIES: may have co-morbidities  that affects occupational performance. Patient will benefit from skilled OT to address above impairments and improve overall function.  REHAB POTENTIAL: Good  PLAN:  OT FREQUENCY: 2x/week  OT DURATION: 6 weeks (dates extended to allow for scheduling)  PLANNED INTERVENTIONS: 97168 OT Re-evaluation, 97535 self care/ADL training, 16109 therapeutic exercise, 97530 therapeutic activity, 97112 neuromuscular re-education, 97140 manual therapy, 97035 ultrasound, 97018 paraffin, 60454 fluidotherapy, 97010 moist heat, 97010 cryotherapy, 97760 Orthotic Initial, 97761 Prosthetic Initial, 97763 Orthotic/Prosthetic subsequent, passive range of motion, functional mobility training, visual/perceptual remediation/compensation, energy conservation, patient/family education, and DME and/or AE instructions  RECOMMENDED OTHER SERVICES: PT eval completed, SLP eval scheduled  CONSULTED AND AGREED WITH PLAN OF CARE: Patient and family member  PLAN FOR NEXT SESSION:   Review theraband and dowel  HEPs  Functional use of LUE: e.g. clipping nails, putting hair in ponytail, grooming, navigating phone, typing, see PSFS items  Review ROM and theraputty HEP PRN FM coordination and functional reaching activities - e.g. Blaze Pods, small pegs in-hand manipulation, thumb ROM Overhead strengthening - e.g. Squigz at [employer], Pegs at upright mirror, Ball at wall [employer])  Altamease Asters, OT 03/07/2024, 3:37 PM

## 2024-03-09 ENCOUNTER — Other Ambulatory Visit (HOSPITAL_COMMUNITY): Payer: Self-pay

## 2024-03-13 ENCOUNTER — Ambulatory Visit: Admitting: Occupational Therapy

## 2024-03-13 ENCOUNTER — Ambulatory Visit: Admitting: Physical Therapy

## 2024-03-13 ENCOUNTER — Ambulatory Visit: Admitting: Speech Pathology

## 2024-03-13 VITALS — BP 106/68 | HR 84

## 2024-03-13 DIAGNOSIS — R2689 Other abnormalities of gait and mobility: Secondary | ICD-10-CM

## 2024-03-13 DIAGNOSIS — R278 Other lack of coordination: Secondary | ICD-10-CM

## 2024-03-13 DIAGNOSIS — R41841 Cognitive communication deficit: Secondary | ICD-10-CM

## 2024-03-13 DIAGNOSIS — M6281 Muscle weakness (generalized): Secondary | ICD-10-CM

## 2024-03-13 DIAGNOSIS — R471 Dysarthria and anarthria: Secondary | ICD-10-CM

## 2024-03-13 DIAGNOSIS — R29898 Other symptoms and signs involving the musculoskeletal system: Secondary | ICD-10-CM

## 2024-03-13 DIAGNOSIS — R29818 Other symptoms and signs involving the nervous system: Secondary | ICD-10-CM

## 2024-03-13 DIAGNOSIS — I69354 Hemiplegia and hemiparesis following cerebral infarction affecting left non-dominant side: Secondary | ICD-10-CM

## 2024-03-13 NOTE — Therapy (Signed)
 OUTPATIENT OCCUPATIONAL THERAPY NEURO TREATMENT  Patient Name: Faith Jordan MRN: [id number] DOB:1996/05/28, 28 y.o., female Today's Date: 03/13/2024  PCP: Gabriel John, NP  REFERRING PROVIDER: Genetta Kenning, MD   END OF SESSION:  OT End of Session - 03/13/24 1000     Visit Number 10    Number of Visits 13   including eval   Date for OT Re-Evaluation 04/06/24    Authorization Type [employer] [facility]) 2025    OT Start Time [phone]    OT Stop Time 0930    OT Time Calculation (min) 44 min    Activity Tolerance Patient tolerated treatment well    Behavior During Therapy [facility] for tasks assessed/performed             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   Acute flank pain 06/02/2022   Angio-edema 04/27/2016   Angioedema    Aquagenic angio-edema-urticaria    Asthma    no problems /not used recently   Dysautonomia (HCC)    Dysrhythmia    sinus tachycardia   Fibroid    right breast, adenoma   Headache(784.0)    History of COVID-19 11/21/2020   Hives 12/22/2015   Pneumonia    hx  6th grade   Sensation of fullness in both ears 02/02/2023   Snoring 02/11/2022   Urinary frequency 02/12/2019   Vaginal yeast infection 06/02/2022   Vasculitis (HCC)    Past Surgical History:  Procedure Laterality Date   ADENOIDECTOMY     BREAST LUMPECTOMY Right    BREAST LUMPECTOMY Right    MASS EXCISION Right 10/07/2014   Procedure: EXCISION OF RIGHT BREAST MASS;  Surgeon: Adalberto Hollow, MD;  Location: [facility] OR;  Service: General;  Laterality: Right;   TONSILLECTOMY AND ADENOIDECTOMY     TOOTH EXTRACTION     TRANSESOPHAGEAL ECHOCARDIOGRAM (CATH LAB) N/A 01/10/2024   Procedure: TRANSESOPHAGEAL ECHOCARDIOGRAM;  Surgeon: Hugh Madura, MD;  Location: [facility] INVASIVE CV LAB;  Service: Cardiovascular;  Laterality: N/A;   Patient Active Problem List   Diagnosis Date Noted   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) 02/10/2024   Coping style affecting medical condition 01/19/2024   History of CVA (cerebrovascular accident) 01/12/2024   Stroke of right basal ganglia (HCC) 01/07/2024   Non-restorative sleep 05/26/2022   Sleep paralysis, 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   Vivid dream 05/26/2022   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   Excessive daytime sleepiness 05/26/2022   Sleep disturbance 03/17/2022   Panic attacks 02/11/2022   Vitamin B12 deficiency 07/30/2021   Vitamin D  deficiency 07/30/2021   Family history of hypothyroidism 07/30/2021   GAD (generalized anxiety disorder) 03/18/2020   Fatigue 11/06/2019   Chronic back pain 07/06/2019   Preventative health care 04/27/2018   Migraines 04/25/2018   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   Anemia 01/04/2018   Headache disorder 12/29/2015   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   Sinus tachycardia 12/22/2015   Dysautonomia (HCC) 12/22/2015   Dysautonomia, familial (HCC) 04/22/2015   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   Neurocardiogenic syncope 04/06/2013   Intermittent palpitations 04/06/2013    ONSET DATE: 01/26/2024 (referral date)  REFERRING DIAG: I63.81 (ICD-10-CM) - Other cerebral infarction due to occlusion or stenosis of small artery   THERAPY DIAG:  Other symptoms and signs involving the musculoskeletal system  Other symptoms and signs involving the nervous system  Other lack of coordination  Hemiplegia and  hemiparesis following cerebral infarction affecting left non-dominant side (HCC)  Muscle weakness (generalized)  Rationale for Evaluation and Treatment: Rehabilitation  SUBJECTIVE:   SUBJECTIVE STATEMENT: Pronunciation: "Bree-Anne-ah"  Pt reported liking the kinesiotape and reported the thumb "feels normal" when wearing kinesiotape. Pt and family reported unsure how to re-apply kinesiotape. Pt reported moving back home soon and feeling overwhelmed.   Pt accompanied by: self    PERTINENT HISTORY: familiar hemiplegic migraine, angioedema 2017,dysautonomia; infarct right internal capsule/ R basal ganglia stroke   MRI of brain on 01/07/24   IMPRESSION: 1. 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 2. Susceptibility related calcifications in the globus pallidus bilaterally. This likely reflects the sequela of chronic microvascular ischemia.  PRECAUTIONS: Fall, Per 02/10/24 MD Office Visit: Pt may gradually begin to return to driving.   WEIGHT BEARING RESTRICTIONS: No  PAIN:  Are you having pain? No  FALLS: Has patient fallen in last 6 months? Yes when CVA event occurred.  03/05/24 - Pt reported recent fall after trying to get clothes out of dryer. Pt denied injuries and able to ind get up after fall.   LIVING ENVIRONMENT: Lives with: lives with their family Lives in: House/apartment Stairs: Yes: External: 4 steps; bilateral but cannot reach both Has following equipment at home: shower chair  PLOF: Independent, typically living ind, worked as Engineer, civil (consulting)  PATIENT GOALS: "I want to get my hand and arm back and be able to go to work."  OBJECTIVE:  Note: Objective measures were completed at Evaluation unless otherwise noted.  HAND DOMINANCE: Right  ADLs: Overall ADLs:  Transfers/ambulation related to ADLs: Eating: ind Grooming: difficulty with completing hair styling e.g. putting hair up in ponytail, difficulty with curling hair. Uses RUE to complete toothbrushing and brushing hair.  Difficulty with clipping fingernails. UB Dressing: ind, extra time. Buttons and zippers difficult.  LB Dressing: Ind, difficulty with pulling up pants and buttoning buttons on jeans, extra time for tying shoes Toileting: ind  Bathing: difficulty washing hair d/t unable to apply pressure while moving LUE, typically uses RUE to ensure thoroughness, shower bench is available PRN Tub Shower transfers: walk-in shower, pt reported SOB  after showering or when walking a lot, sometimes requires rest breaks Equipment: Shower seat with back and Walk in shower  IADLs: Shopping: assistance from parents but pt participates, such as trying to reach for items on shelf and pushing cart Light housekeeping: pt completes dishwashing, has not attempted laundry Meal Prep: pt reported cooking but task is tiring Community mobility: has attempted to drive 1x with parent  Handwriting: No change per pt.   Work activities: Pt expressed concerns with FM coordination and concerns about return to work considerations, such as coordination required for drawing up syringes from a vial for medication administration, completing IVs, typing, navigating phone.   MOBILITY STATUS: Independent  POSTURE COMMENTS:  Ind sitting balance, sometimes forward shoulder posture  ACTIVITY TOLERANCE: Activity tolerance: Pt reported "I'm doing more than I was doing in the hospital, but I definitely get tired easier and take more breaks."  FUNCTIONAL OUTCOME MEASURES: PSF: 3.0    Total score = sum of the activity scores/number of activities Minimum detectable change (90%CI) for average score = 2 points Minimum detectable change (90%CI) for single activity score = 3 points   UPPER EXTREMITY ROM:    Active ROM Right eval Left eval  Shoulder flexion [facility]  Shoulder abduction  [facility]  Shoulder adduction    Shoulder  extension    Shoulder internal rotation    Shoulder external rotation    Elbow flexion  WFL - slightly delayed movements  Elbow extension    Wrist flexion    Wrist extension    Wrist ulnar deviation    Wrist radial deviation    Wrist pronation    Wrist supination    (Blank rows = not tested)  OT palpated pt's L shoulder and noted "tightness" of muscles at L shoulder.   UPPER EXTREMITY MMT:     MMT Right eval Left eval  Shoulder flexion 5 3+   HAND FUNCTION: Grip strength: Right: 48.9, 53.5, 53.3 (51.9 lbs average) lbs; Left:  24.2, 22.2, 22.7 (23.1 lbs average) lbs  COORDINATION: 9 Hole Peg test: Right: 20 sec; Left: 40 sec  Pt reported practicing picking up pills, sorting change, and playing connect-4 at home.  SENSATION: Pt reported tingling in L thumb which has since resolves. Pt questioned if symptoms might have been from resting hand splint at night. Pt reported no other symptoms of numbness/tingling.  EDEMA: None noted  MUSCLE TONE: RUE: Within functional limits and LUE: slight ataxia  COGNITION: Overall cognitive status: Within functional limits for tasks assessed  Pt reported some difficulty with word-finding.  Pt reported sometimes asking same question again "a few hours apart." Pt's mother reported not noticing this recently.  VISION: Subjective report: Pt reported initial changes to vision (unable to read, blurry vision close-up and far away) though "okay now."  Baseline vision: No visual deficits Visual history: none noted  VISION ASSESSMENT: Pt accurately clock on wall and reported reading books on Kindle. Pt reported still using large print on phone since hospital.   Pt tracked easily to all 4 quadrants. Peripheral vision - WFL B sides, ?slightly decreased on L side though functional regardless  Patient has difficulty with following activities due to following visual impairments: None noted  PERCEPTION: Not tested  PRAXIS: Not tested  OBSERVATIONS: Pt ambulated ind. Pt soft-spoken and appeared tired during session. Pt was pleasant and appeared motivated to complete LUE functional tasks at home based on pt report.                                                                                                                             TREATMENT DATE:    Self-Care OT educated pt on kinesiotape purpose and application/recommendations, intent to f/u with additional kinesiotaping resources at upcoming OT sessions, strategies to return to ind living - e.g. graded return, energy  conservation strategies, emphasize quality of motion during tasks and taking breaks PRN. Pt acknowledged understanding of all.    TherAct Standing with gait belt, Ball at wall alphabet - palm then graded up to fingertips - for affected UE ROM, endurance, functional reach, to improve static standing balance and affected UE motor coordination.  OT assessed pt's progress towards goals, see below for updates.   HEP update: Graded up to Pink theraputty with adjustments  to reps/sets - to improve affected UE strengthening, coordination and to improve carryover.  Access Code: [email or web address] URL: [email or web address] Date: 03/13/2024 Prepared by: Daina Drum  Exercises - Putty Squeezes  - 1 x daily - 1 sets - 10 reps - Rolling Putty on Table  - 1 x daily - 1 sets - 10 reps - Tip Pinch with Putty  - 1 x daily - 1 sets - 10 reps - 3-Point Pinch with Putty  - 1 x daily - 1 sets - 10 reps - Key Pinch with Putty  - 1 x daily - 1 sets - 10 reps - Finger Extension with Putty  - 1 x daily - 1 sets - 10 reps  TherEx Flex bar (tan) - pronation, supination, twist - 2 sets of 5 reps - to improve affected UE strengthening and gross motor coordination.   Supination/pronation with hammer - 3 sets of 10 reps - increasing hand placement distance from head of hammer each set to provide additional challenge - to improve affected UE strengthening and gross motor coordination.   PATIENT EDUCATION: Education details: see today's tx above Person educated: Patient Education method: Explanation, Demonstration, Verbal cues, and Handouts Education comprehension: verbalized understanding, returned demonstration, verbal cues required, and needs further education  HOME EXERCISE PROGRAM: 02/09/24 - Affected UE ROM and Yellow Theraputty. Access Code: [email or web address]. 02/15/2024: L handed typing words 02/21/24 - Digit ext with theraputty or rubber band (access code: [email or web address]). Functional reach: Handwritten/Verbal  instructions: 1 set in front of mirror, 1 set without mirror: Reaching overhead to touch top of head with affected hand, reaching overhead to touch back of head with affected hand, reaching arm forward and touching nose with affected UE - 10 reps, 2 sets, 2x per day. 03/07/2024: Theraband; dowel 03/13/24 - graded up to pink theraputty. Reps/sets adjusted: Access Code: [email or web address].  GOALS: Goals reviewed with patient? Yes  SHORT TERM GOALS: Target date: 03/09/24  Pt will ind demo understanding of HEP using visual handouts. Baseline: new to outpt OT Goal status: in progress  2.  Pt will ind recall at least 3 energy conservation strategies. Baseline: Activity tolerance: Pt reported "I'm doing more than I was doing in the hospital, but I definitely get tired easier and take more breaks."  03/13/24 - Pt ind recalled breathing, spacing out activities throughout the day, naps, taking breaks PRN.  Goal status: MET  3.  Pt and/or family will return demo of personal e-stim unit use and electrode placement for affected UE.  Baseline: pt reported she has ordered a personal e-stim unit.  02/09/24 - Education and handout provided. Pt returned demo of using personal e-stim unit for wrist flex/ext of affected UE. Goal status: MET  4.  Pt will report improved ability to use LUE during overhead reaching tasks with minimal use of RUE to compensate, such as hair care tasks and washing hair. Baseline: pt reported difficulty with hair styling (e.g. putting hair in ponytail, curling hair) 03/13/24 - Pt reported continued difficulty with putting hair in a bun and washing hair. Goal status: in progress  LONG TERM GOALS: Target date: 04/06/24  Patient will report at least two-point increase in average PSFS score or at least three-point increase in a single activity score indicating functionally significant improvement given minimum detectable change using adaptive strategies and A/E PRN. Baseline: PSF: 3.0 total score  (See above for individual activity scores)  Goal status: in progress  2.  Patient will demonstrate at least  37 lbs LUE grip strength  as needed to open jars and other containers.  Baseline: Grip strength: Right: 48.9, 53.5, 53.3 (51.9 lbs average) lbs; Left: 24.2, 22.2, 22.7 (23.1 lbs average) lbs 03/07/2024: LUE - 33.1 lbs Goal status: MET on 03/07/24 and revised on 03/13/24  3.  Patient will demo improved FM coordination as evidenced by completing nine-hole peg with use of LUE in 30 seconds or less.  Baseline: 9 Hole Peg test: Right: 20 sec; Left: 40 sec 03/07/2024: 34 sec Goal status: in progress  4.  Pt will verbalize understanding of A/E, adaptive strategies for functional tasks as needed for return to work considerations. Baseline: Work activities: Pt expressed concerns with FM coordination and concerns about return to work considerations, such as coordination required for drawing up syringes from a vial for medication administration, completing IVs, typing, navigating phone.  Goal status: in progress  ASSESSMENT:  CLINICAL IMPRESSION: Pt met 2 goals. OT revised strengthening goal of affected UE. Pt continuing to demo steady progress for coordination and strength of affected UE though pt reported discouraged by affected UE grip strength and coordination, stating "it's not normal." Pt benefited from verbal reassurance and encouragement throughout session. Pt tolerated tasks well, and benefited from cues for postural correction and motor control with LUE movements to emphasize quality of motion.   PERFORMANCE DEFICITS: in functional skills including ADLs, IADLs, coordination, dexterity, proprioception, sensation, tone, ROM, strength, pain, muscle spasms, flexibility, Fine motor control, Gross motor control, mobility, balance, endurance, and UE functional use, cognitive skills including attention and energy/drive, and psychosocial skills including environmental adaptation.   IMPAIRMENTS: are  limiting patient from ADLs, IADLs, rest and sleep, work, leisure, and social participation.   CO-MORBIDITIES: may have co-morbidities  that affects occupational performance. Patient will benefit from skilled OT to address above impairments and improve overall function.  REHAB POTENTIAL: Good  PLAN:  OT FREQUENCY: 2x/week  OT DURATION: 6 weeks (dates extended to allow for scheduling)  PLANNED INTERVENTIONS: 97168 OT Re-evaluation, 97535 self care/ADL training, 40981 therapeutic exercise, 97530 therapeutic activity, 97112 neuromuscular re-education, 97140 manual therapy, 97035 ultrasound, 97018 paraffin, 19147 fluidotherapy, 97010 moist heat, 97010 cryotherapy, 97760 Orthotic Initial, 97761 Prosthetic Initial, 97763 Orthotic/Prosthetic subsequent, passive range of motion, functional mobility training, visual/perceptual remediation/compensation, energy conservation, patient/family education, and DME and/or AE instructions  RECOMMENDED OTHER SERVICES: PT eval completed, SLP eval scheduled  CONSULTED AND AGREED WITH PLAN OF CARE: Patient and family member  PLAN FOR NEXT SESSION:   Review theraband and dowel HEPs  Functional use of LUE: e.g. clipping nails, putting hair in ponytail, grooming, navigating phone, typing, see PSFS items  Review ROM and theraputty HEP PRN FM coordination and functional reaching activities - e.g. Blaze Pods, small pegs in-hand manipulation, thumb ROM Overhead strengthening - e.g. Squigz at [employer], Pegs at upright mirror, Ball at wall [employer])  Re-apply kinesiotape to thumb and consider ?handout, ?have pt/family video with pt's personal cell phone (with pt permission) to improve carryover to home environment  Oakley Bellman, OT 03/13/2024, 10:06 AM

## 2024-03-13 NOTE — Therapy (Signed)
 OUTPATIENT SPEECH LANGUAGE PATHOLOGY TREATMENT NOTE   Patient Name: Faith Jordan MRN: [id number] DOB:1996/05/05, 28 y.o., female Today's Date: 03/13/2024  PCP: Gabriel John NP REFERRING PROVIDER: Genetta Kenning, MD  END OF SESSION:  End of Session - 03/13/24 0932     Visit Number 2    Number of Visits 13    Date for SLP Re-Evaluation 05/22/24    Authorization Type [facility] employee    SLP Start Time 0932    SLP Stop Time  1015    SLP Time Calculation (min) 43 min             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   Acute flank pain 06/02/2022   Angio-edema 04/27/2016   Angioedema    Aquagenic angio-edema-urticaria    Asthma    no problems /not used recently   Dysautonomia (HCC)    Dysrhythmia    sinus tachycardia   Fibroid    right breast, adenoma   Headache(784.0)    History of COVID-19 11/21/2020   Hives 12/22/2015   Pneumonia    hx  6th grade   Sensation of fullness in both ears 02/02/2023   Snoring 02/11/2022   Urinary frequency 02/12/2019   Vaginal yeast infection 06/02/2022   Vasculitis (HCC)    Past Surgical History:  Procedure Laterality Date   ADENOIDECTOMY     BREAST LUMPECTOMY Right    BREAST LUMPECTOMY Right    MASS EXCISION Right 10/07/2014   Procedure: EXCISION OF RIGHT BREAST MASS;  Surgeon: Adalberto Hollow, MD;  Location: [facility] OR;  Service: General;  Laterality: Right;   TONSILLECTOMY AND ADENOIDECTOMY     TOOTH EXTRACTION     TRANSESOPHAGEAL ECHOCARDIOGRAM (CATH LAB) N/A 01/10/2024   Procedure: TRANSESOPHAGEAL ECHOCARDIOGRAM;  Surgeon: Hugh Madura, MD;  Location: [facility] INVASIVE [facility] LAB;  Service: Cardiovascular;  Laterality: N/A;   Patient Active Problem List   Diagnosis Date Noted   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) 02/10/2024   Coping style affecting medical condition 01/19/2024   History of CVA (cerebrovascular accident) 01/12/2024    Stroke of right basal ganglia (HCC) 01/07/2024   Non-restorative sleep 05/26/2022   Sleep paralysis, 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   Vivid dream 05/26/2022   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   Excessive daytime sleepiness 05/26/2022   Sleep disturbance 03/17/2022   Panic attacks 02/11/2022   Vitamin B12 deficiency 07/30/2021   Vitamin D  deficiency 07/30/2021   Family history of hypothyroidism 07/30/2021   GAD (generalized anxiety disorder) 03/18/2020   Fatigue 11/06/2019   Chronic back pain 07/06/2019   Preventative health care 04/27/2018   Migraines 04/25/2018   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   Anemia 01/04/2018   Headache disorder 12/29/2015   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   Sinus tachycardia 12/22/2015   Dysautonomia (HCC) 12/22/2015   Dysautonomia, familial (HCC) 04/22/2015   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   Neurocardiogenic syncope 04/06/2013   Intermittent palpitations 04/06/2013    ONSET DATE: 01/26/2024 (referral date)   REFERRING DIAG: I63.81 (ICD-10-CM) - Other cerebral infarction due to occlusion or stenosis of small artery  THERAPY DIAG:  Dysarthria and anarthria  Cognitive communication deficit  Rationale for Evaluation and Treatment: Rehabilitation  SUBJECTIVE:   SUBJECTIVE STATEMENT: Pt presenting with concerns regarding her memory and motor speech. Flat affect throughout session. Pt accompanied by: self  PERTINENT HISTORY: "Faith Jordan is a 28 y.o. 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  Last known well at 8:00 PM on 01/06/2024.  She initially thought her left-sided weakness were related to her hemiplegic migraine.  However, they usually involve her face only and not other parts of her body.  She took her migraine medications, but her hemiaplasia persisted so she came to the ED.  In the ER, noncontrast head CT showed no acute intracranial abnormality, dense calcification within the globus pallidus bilaterally, nonspecific finding that can be seen in the setting of Fahr's disease.  CT angio head and neck showing no evidence of LVO.  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 CTA head & neck Normal CTA of the neck. Normal CTA Circle of Willis without significant proximal stenosis, aneurysm, or branch vessel occlusion.as US  TCD Bubble negative for right-to-left shunt, Vas US  LE negative for DVT, Hypercoagulable panel negative so far and ANA negative 2D Echo EF 60-65%.  Left atrial size normal."    PAIN:  Are you having pain? No  FALLS: Has patient fallen in last 6 months?  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  PATIENT REPORTED OUTCOME MEASURES (PROM): To be completed first therapy session                                                                                                                            TREATMENT DATE:  03/13/24: Used multifactorial memory questionnaire and pt reports to ID memory mistakes to address: Forgot to bring stuff with you  Forgetting people's names (e.g. coworkers last names)   Word finding (e.g.  cooperating)  Forgetting to pass on messages   SLP provided pt education for use of internal and external memory strategies. Discussed rational for repetition, write-it down, reminders app, getting formation in writing. Examples given for uses of each. Pt reports frustration regarding need to use strategies where as before she would not need to. Discussed "rose colored glasses" and re-framing of looking at memory mistakes as a problem to solve instead of a reflection on self. Will continue to discuss re-framing to reduce negative impact of internal focus as barrier to use of strategies. Plan to instruct for anomia strategies next session.   02/28/24: Education provided on evaluation results and SLP's  recommendations. Pt verbalizes agreement with POC, all questions answered to satisfaction.  Initiated training re: "clear speech" for improved speech production. Education provided regarding clear speech principles of reduced rate, increased loudness, and over-articulation. Demonstration provided. HEP initiated for use of clear speech during daily reading. HEP resources provided for cognition.   PATIENT EDUCATION: Education details: see above Person educated: Patient Education method: Explanation, Demonstration, and Handouts Education comprehension: verbalized understanding, returned demonstration, and needs further education   GOALS: Goals reviewed with patient? Yes  SHORT TERM GOALS: Target date: 04/10/2024  Pt will use log to ID functional memory mistakes or cognitive concerns in home environment over 2 week period Baseline: Goal status: IN PROGRESS  2.  Pt will utilize dysarthria strategies and compensations during oral reading 2+ paragraphs with WNL volume and improved articulatory precision with mod-I Baseline:  Goal status: IN PROGRESS  3.  Pt will teach back compensations for the dysarthric speaker with mod-I Baseline:  Goal status: IN PROGRESS   LONG TERM GOALS: Target date:  05/22/2024  Pt will develop and utilize memory support strategies or external cognitive aid to address x2 home based and x2 work based memory concerns  Baseline:  Goal status: IN PROGRESS  2.  Pt will complete dysarthria and cognitive skill HEP daily over 2 week period Baseline:  Goal status: IN PROGRESS  3.  Pt will utilize dysarthria strategies and compensations during 20 minute conversation with WNL volume and improved articulatory precision with mod-I Baseline:  Goal status: IN PROGRESS   ASSESSMENT:  CLINICAL IMPRESSION: Patient is a 28 y.o. F who was seen today for ST evaluation for stroke. Evaluation reveals mild dysarthria and standardized cognitive assessment completed with pt scoring WNL. Pt's speech is primarily c/b reduced volume, imprecise articulation and flat affect. Pt reports speaking louder/clearing takes a lot of effort, speech worsens with fatigue. Pt endorsing concerns regarding her executive function and memory skills. She tells SLP she has to focus "really hard" in order to process information, supported by pt's presentation during today's evaluation. Pt would occasionally ask for repetition of directions or re-explanation of concepts provided. Pt fears her memory will impact her successful engagement in relationships if people think she is forgetful and successful return to work as ED nurse. Pt having trouble classifying how percieved memory impairments impacting daily activities, I asked her to keep a log so therapy can be directed at improved performance of needed activities. Will plan to attempt simulation of word-related cognitive tasks as well to ensure skills intact for complex job duties or ID of potential facilitative accommodations. I recommend skilled ST to address aforementioned deficits to maximize independence and return to baseline.    OBJECTIVE IMPAIRMENTS: include attention, memory, and dysarthria. These impairments are limiting patient from return to work,  household responsibilities, and effectively communicating at home and in community.Factors affecting potential to achieve goals and functional outcome are no overt barriers. Patient will benefit from skilled SLP services to address above impairments and improve overall function.  REHAB POTENTIAL: Good  PLAN:  SLP FREQUENCY: 2x/week  SLP DURATION: 12 weeks  PLANNED INTERVENTIONS: Cueing hierachy, Cognitive reorganization, Internal/external aids, Functional tasks, SLP instruction and feedback, Compensatory strategies, and Patient/family education    Alston Jerry, CCC-SLP 03/13/2024, 9:32 AM

## 2024-03-13 NOTE — Patient Instructions (Signed)
+   instead of being upset about having a memory mistake, think: what could I do differently (problem solve)   +repeat & write things down  + use the reminders app on your phone  +location or time reminder  +reoccurring reminders   +use Elicia Ground: "remind me at 4 pm to take out the trash"  +for messages: can you send me that in a text message? (Leave message unread until you relay the message   +healthy brain habits: Diet Exercise  Creatine and D3 supplements

## 2024-03-13 NOTE — Therapy (Signed)
 OUTPATIENT PHYSICAL THERAPY NEURO TREATMENT   Patient Name: Faith Jordan MRN: [id number] DOB:[birth year], 28 y.o., female Today's Date: 03/13/2024   PCP: Gabriel John, NP REFERRING PROVIDER: Genetta Kenning, MD   END OF SESSION:  PT End of Session - 03/13/24 0802     Visit Number 12    Number of Visits 13    Date for PT Re-Evaluation 03/20/24    Authorization Type [employer]    PT Start Time 0801    PT Stop Time 0845    PT Time Calculation (min) 44 min    Equipment Utilized During Treatment Gait belt    Activity Tolerance Patient tolerated treatment well    Behavior During Therapy [facility] for tasks assessed/performed                    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   Acute flank pain 06/02/2022   Angio-edema 04/27/2016   Angioedema    Aquagenic angio-edema-urticaria    Asthma    no problems /not used recently   Dysautonomia (HCC)    Dysrhythmia    sinus tachycardia   Fibroid    right breast, adenoma   Headache(784.0)    History of COVID-19 11/21/2020   Hives 12/22/2015   Pneumonia    hx  6th grade   Sensation of fullness in both ears 02/02/2023   Snoring 02/11/2022   Urinary frequency 02/12/2019   Vaginal yeast infection 06/02/2022   Vasculitis (HCC)    Past Surgical History:  Procedure Laterality Date   ADENOIDECTOMY     BREAST LUMPECTOMY Right    BREAST LUMPECTOMY Right    MASS EXCISION Right 10/07/2014   Procedure: EXCISION OF RIGHT BREAST MASS;  Surgeon: Adalberto Hollow, MD;  Location: [facility] OR;  Service: General;  Laterality: Right;   TONSILLECTOMY AND ADENOIDECTOMY     TOOTH EXTRACTION     TRANSESOPHAGEAL ECHOCARDIOGRAM (CATH LAB) N/A 01/10/2024   Procedure: TRANSESOPHAGEAL ECHOCARDIOGRAM;  Surgeon: Hugh Madura, MD;  Location: [facility] INVASIVE CV LAB;  Service: Cardiovascular;  Laterality: N/A;   Patient Active Problem List   Diagnosis Date Noted   Spastic hemiparesis of  left nondominant side due to acute cerebral infarction (HCC) 02/10/2024   Coping style affecting medical condition 01/19/2024   History of CVA (cerebrovascular accident) 01/12/2024   Stroke of right basal ganglia (HCC) 01/07/2024   Non-restorative sleep 05/26/2022   Sleep paralysis, 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   Vivid dream 05/26/2022   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   Excessive daytime sleepiness 05/26/2022   Sleep disturbance 03/17/2022   Panic attacks 02/11/2022   Vitamin B12 deficiency 07/30/2021   Vitamin D  deficiency 07/30/2021   Family history of hypothyroidism 07/30/2021   GAD (generalized anxiety disorder) 03/18/2020   Fatigue 11/06/2019   Chronic back pain 07/06/2019   Preventative health care 04/27/2018   Migraines 04/25/2018   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   Anemia 01/04/2018   Headache disorder 12/29/2015   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   Sinus tachycardia 12/22/2015   Dysautonomia (HCC) 12/22/2015   Dysautonomia, familial (HCC) 04/22/2015   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   Neurocardiogenic syncope 04/06/2013   Intermittent palpitations 04/06/2013    ONSET DATE: 01/26/2024 (referral)   REFERRING DIAG:  I63.81 (ICD-10-CM) - Other cerebral infarction due to occlusion or stenosis of small artery    THERAPY DIAG:  Muscle weakness (generalized)  Other abnormalities of gait and mobility  Hemiplegia and hemiparesis following cerebral infarction affecting left non-dominant side (HCC)  Rationale for Evaluation and Treatment: Rehabilitation  SUBJECTIVE:                                                                                                                                                                                             SUBJECTIVE STATEMENT: Pt denies any acute changes since last visit. L foot still bothers her a bit when she first wakes up. Plans on moving back to her apartment this week.   Did a lot of lifting  and moving yesterday and her leg was very shaky. Mother reports "even her hair was shaking".    Pt has not returned to gym and states her brother is building a gym in parents home and would rather use this as she is nervous to be alone.   Pt accompanied by: Mother  PERTINENT HISTORY: familiar hemiplegic migraine, angioedema 2017,dysautonomia; infarct right internal capsule/ R basal ganglia stroke  PAIN:  Are you having pain? No    PRECAUTIONS: Fall  RED FLAGS: None   WEIGHT BEARING RESTRICTIONS: No  FALLS: Has patient fallen in last 6 months? No  LIVING ENVIRONMENT: Lives with: lives with their family Lives in: House/apartment Stairs: Yes: External: 4 steps; bilateral but cannot reach both Has following equipment at home: shower chair  PLOF: Independent  PATIENT GOALS: "Being normal again. Being able to go back to work and be independent"   OBJECTIVE:  Note: Objective measures were completed at Evaluation unless otherwise noted.  DIAGNOSTIC FINDINGS: MRI of brain on 01/07/24  IMPRESSION: 1. 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 2. Susceptibility related calcifications in the globus pallidus bilaterally. This likely reflects the sequela of chronic microvascular ischemia.  COGNITION: Overall cognitive status: Within functional limits for tasks assessed   SENSATION: Pt denies numbness/tingling in L hemibody   MUSCLE TONE: Non-fatiguing clonus of LLE  POSTURE: No Significant postural limitations  LOWER EXTREMITY ROM:     Active  Right Eval Left Eval  Hip flexion    Hip extension    Hip abduction    Hip adduction    Hip internal rotation    Hip external rotation    Knee flexion    Knee extension    Ankle dorsiflexion    Ankle plantarflexion    Ankle inversion    Ankle eversion     (Blank rows = not tested)  LOWER EXTREMITY MMT:  Tested in seated position   MMT Right Eval Left Eval  Hip flexion 5 4+   Hip extension  Hip abduction 5 5  Hip adduction 5 5  Hip internal rotation    Hip external rotation    Knee flexion 5 5  Knee extension 5 4+  Ankle dorsiflexion 5 5  Ankle plantarflexion    Ankle inversion    Ankle eversion    (Blank rows = not tested)  BED MOBILITY:  Not tested  TRANSFERS: Sit to stand: Complete Independence  Assistive device utilized: None     Stand to sit: Complete Independence  Assistive device utilized: None      RAMP:  Not tested  CURB:  Not tested  GAIT: Gait pattern: step through pattern, decreased arm swing- Left, decreased step length- Right, decreased stance time- Left, decreased stride length, decreased hip/knee flexion- Left, lateral hip instability, and wide BOS Distance walked: Various clinic distances  Assistive device utilized: None Level of assistance: Modified independence Comments: Pt walks very guarded, as if she feels unstable. No instability noted other than single instance of L knee buckling which pt able to self-correct.    PATIENT SURVEYS:  ABC scale to be obtained    VITALS  Vitals:   03/13/24 0812  BP: 106/68  Pulse: 84                                                                                                                                TREATMENT:    Self-Care Assessed vitals (see above) and BP on lower end but WNL for session.  Discussed return to gym and pt inquiring if she should use the TM, elliptical or bike. Informed pt that all 3 have benefits but ultimately if she wants to get better at walking, the treadmill is her best option. Pt verbalized understanding. Recommended pt work on interval training as her tone increases w/fatigue and she will need to work on endurance.   NMR The following activities were performed for improved LE coordination, eccentric control, single leg stability and agility:  15# Surge carry x230' around gym mod I. No lateral instability noted  Ascended/descended 20 6" steps  while holding 10# med ball in bear hug position w/SBA. Noted good stability w/ascent but pt tends to lose balance to R side when descending due to decreased eccentric control on LLE and attempt to maintain weight in RLE.  Eccentric heel taps from 6" curb w/10# KB in LUE and 12# KB in RUE, x10 reps per side w/CGA-min A due to decreased control on LLE. Pt attempting to perform w/LLE in ER due to quad weakness, so cued pt for proper leg position and pt much more challenged.   Immediately following heel taps at curb, had pt ascend/descend 8 6" stairs w/same weights in hands and improved midline orientation w/descent. Pt reports she will continue to work on this at home  Agility ladder drills:  Two feet per square, x2 reps down and back leading w/each foot w/SBA  Two feet in and two feet out, x2 reps  down and back w/SBA. Noted R foot catching ladder throughout due to decreased weight shift to L  Double jump into each square, x1 rep down and back w/CGA-min A.  Jogging x4 reps x30' in gym. Noted L foot slap and pt maintaining upright posture but no instability noted.      PATIENT EDUCATION: Education details: Work on jumping over a line at home and eccentric heel taps, recommendations for the gym  Person educated: Patient and Parent Education method: Explanation, Facilities manager, and Verbal cues Education comprehension: verbalized understanding, returned demonstration, verbal cues required, and needs further education  HOME EXERCISE PROGRAM: Access Code: [email or web address] URL: [email or web address] Date: 02/13/2024 Prepared by: Burleigh Carp Charle Mclaurin  Exercises - Half Kneeling Lift  - 1 x daily - 7 x weekly - 1 sets - 10 reps - Down Dog with Bent Knees and Heels on Mat  - 1 x daily - 7 x weekly - 2-3 reps - 15-30 seconds hold - Bird Dog  - 1 x daily - 7 x weekly - 3 sets - 10 reps - Lateral Shuffles  - 1 x daily - 7 x weekly - 3 sets - 10 reps - Braided Sidestepping  - 1 x daily - 7 x weekly - 3 sets  - 10 reps - Standing Balance Activity: Plyometric Modified Lower Extremity Jumping Jack  - 1 x daily - 7 x weekly - 3 sets - 10 reps - Standing Forward Step Taps with Counter Support  - 1 x daily - 7 x weekly - 3 sets - 10 reps (at bottom of steps with no UE support, red TB around ankles) - Ankle Inversion with Resistance  - 1 x daily - 7 x weekly - 3 sets - 10 reps  From CIR Therapist: Also instructed pt in 2 additional exercises but no printed versions available: - tall kneeling to half kneeling - sit to half kneeling and then return to sit. Perform to each side.   Verbally added 02/21/24: -squats at countertop with chair behind you  Verbally added 03/13/24: - eccentric heel taps  - jumping over line fwd/retro/laterally   GOALS: Goals reviewed with patient? Yes  SHORT TERM GOALS: Target date: 02/28/2024  Pt will improve FGA to 28/30 for decreased fall risk  Baseline: 21/30, 25/30 (5/13) Goal status: IN PROGRESS  2.  HiMat to be assessed at STG check and LTG updated  Baseline: 11/54 (5/13) Goal status: MET  3.  Pt will improve gait velocity to at least 3.4 ft/s for improved gait efficiency and return to PLOF Baseline: 3.0 ft/sec (eval), 3.31 ft/sec (5/13) Goal status: IN PROGRESS  4.  ABC scale to be assessed and LTG updated  Baseline: 70.6% (5/13) Goal status: MET  5.  Pt will demonstrate proper lifting technique of 25# weight from floor for proper body mechanics and safety at work Baseline:  Goal status: MET  LONG TERM GOALS: Target date: 03/13/2024   Pt will improve gait velocity to at least 3.5 ft/s for improved gait efficiency and independence Baseline: 3.0 ft/sec (eval), 3.31 ft/sec (5/13) Goal status: REVISED  2.  Pt will improve her score on the HiMat to >/= 16/54 to demonstrate improved balance and function Baseline: 11/54 (5/13) Goal status: INITIAL  3.  Pt will improve her ABC scale score to >/= 80% for improved confidence in her balance. Baseline: 70.6%  (5/13) Goal status: INITIAL  4.  Pt will be able to carry 10# object in BUEs up/down 4 steps for improved coordination and safety  in work environment   Baseline: can only carry w/single UE  Goal status: INITIAL  5.  Pt will perform [place] get up w/5# weight for improved shoulder stability OH, core stability and high level balance  Baseline:  Goal status: INITIAL   ASSESSMENT:  CLINICAL IMPRESSION: Emphasis of skilled PT session on single leg stability, agility and eccentric control. Pt demonstrates poor eccentric control on LLE on steps, resulting in LOB to R side when carrying objects. Added to HEP to work on this. Pt reports she is nervous to go to gym or walk by herself so is waiting for her brother to make a home gym at her parent's house for her to use. Pt has been working on jogging and jumping at home and is now able to jump in place. However, pt more challenged w/jumping in anterior direction and landing on her midfoot. Pt's mother attempting to correct pt's form, which confused pt, so encouraged mom to only provide cues if pt's ankle is unstable but otherwise allow pt to practice without cues. Continue POC.    OBJECTIVE IMPAIRMENTS: Abnormal gait, decreased activity tolerance, decreased balance, decreased coordination, decreased endurance, decreased mobility, decreased strength, increased muscle spasms, impaired UE functional use, improper body mechanics, and pain  ACTIVITY LIMITATIONS: carrying, lifting, squatting, stairs, reach over head, hygiene/grooming, and locomotion level  PARTICIPATION LIMITATIONS: meal prep, cleaning, laundry, interpersonal relationship, driving, shopping, community activity, occupation, and yard work  PERSONAL FACTORS: 1 comorbidity: R internal capsule infarct/R basal ganglia stroke are also affecting patient's functional outcome.   REHAB POTENTIAL: Excellent  CLINICAL DECISION MAKING: Evolving/moderate complexity  EVALUATION COMPLEXITY:  Moderate  PLAN:  PT FREQUENCY: 2-3x/week (starting w/2x following eval)  PT DURATION: 6 weeks  PLANNED INTERVENTIONS: 81191- PT Re-evaluation, 97750- Physical Performance Testing, 97110-Therapeutic exercises, 97530- Therapeutic activity, [id number]- Neuromuscular re-education, 97535- Self Care, 47829- Manual therapy, [phone]- Gait training, [phone]- Canalith repositioning, [phone]- Aquatic Therapy, Patient/Family education, Balance training, Stair training, Taping, Dry Needling, Joint mobilization, Spinal mobilization, Vestibular training, and DME instructions  PLAN FOR NEXT SESSION:  Review HEP from CIR and update prn. High level agility, elliptical, L quad eccentric strength, OH tasks. Practice w/plastic needles Sonia Durand has these) in oranges and pin the needle to targets? Cognitive dual tasking, needs to work on deep squats and return to standing from squats, agility drills, treadmill training with focus on increasing speed and potentially running? Half kneel, ladder drills, carrying weight with both hands (on rockerboard, stairs), agility ladder. Jumping   Goals and recert. Already scheduled additional 2x/week for 6 weeks for PT/OT, will recert at 5/29 visit   Faith Jordan, PT, DPT  03/13/2024, 8:46 AM

## 2024-03-14 ENCOUNTER — Other Ambulatory Visit: Payer: Self-pay

## 2024-03-14 ENCOUNTER — Ambulatory Visit (INDEPENDENT_AMBULATORY_CARE_PROVIDER_SITE_OTHER): Admitting: Psychology

## 2024-03-14 ENCOUNTER — Other Ambulatory Visit (HOSPITAL_COMMUNITY): Payer: Self-pay

## 2024-03-14 DIAGNOSIS — F411 Generalized anxiety disorder: Secondary | ICD-10-CM

## 2024-03-14 DIAGNOSIS — F331 Major depressive disorder, recurrent, moderate: Secondary | ICD-10-CM | POA: Diagnosis not present

## 2024-03-14 NOTE — Progress Notes (Signed)
 Specialty Pharmacy Initial Fill Coordination Note  Faith Jordan is a 28 y.o. female contacted today regarding initial fill of specialty medication(s) OnabotulinumtoxinA  (BOTOX )   Patient requested Courier to Provider Office   Delivery date: 03/19/24   Verified address: [facility], [address], [place], [place] [address]   Medication will be filled on 03/15/2024.   Patient is aware of $0 copayment.

## 2024-03-14 NOTE — Progress Notes (Unsigned)
 [facility] Counselor/Therapist Progress Note  Patient ID: Faith Jordan, MRN: [id number],    Date:  03/14/2024  Time Spent: 56 minutes  Time in: 4:04 Time out: 5:00  Treatment Type:- The patient was seen via video visit.  She gave verbal consent for the session to be on video on caregility.  The patient was in her home alone and therapist was in the office.  Reported Symptoms: anxiety, panic attacks  Mental Status Exam: Appearance:  Casual     Behavior: Appropriate  Motor: Normal  Speech/Language:  Normal Rate  Affect: blunted  Mood: sad  Thought process: normal  Thought content:   WNL  Sensory/Perceptual disturbances:   WNL  Orientation: oriented to person, place, time/date, and situation  Attention: Good  Concentration: Good  Memory: WNL  Fund of knowledge:  Good  Insight:   Fair  Judgment:  Good  Impulse Control: Good   Risk Assessment: Danger to Self:  No Self-injurious Behavior: No Danger to Others: No Duty to Warn:no Physical Aggression / Violence:No  Access to Firearms a concern: No  Gang Involvement:No   Subjective: The patient was seen for an individual therapy session  via video visit today.  The patient gave verbal permission for the session to be on video on caregility and she is aware of the limitations of telehealth.  The patient was in her home  and therapist was in the office.  The patient presents with a blunted affect and mood is sad.  The patient was at her own home today and she reports that she is planning to move back there this weekend.  She was tearful and sad and I ask her if she was afraid to move back and she said yes.  I suggested that she try to have her parents stay with her over the weekend and maybe through next week so that she can readjust to being in the place where she had the stroke.  She is afraid of being alone and by herself.  We processed how she has been dealing with things and she keeps thinking about the burden that  she is putting on her parents and she is also very sad and frustrated about not being able to go back to work.  We did do some brainstorming about what she can do with her time when she moved back home.   Interventions: Cognitive Behavioral Therapy and Assertiveness/Communication,, problem solving, psychoeducation,  EMDR as indicated, Meditation and mindfulness,   Diagnosis:GAD (generalized anxiety disorder)  Major depressive disorder, recurrent episode, moderate (HCC)  Plan: Client Abilities/Strengths  Intelligent, insightful, motivated  Client Treatment Preferences  Outpatient Individual therapy every other week  Client Statement of Needs  " I feel better now, but still need some help with anxiety"  Treatment Level  Outpatient Individual therapy  Symptoms  Frustration and anxiety related to providing oversight and caretaking to an aging, ailing, and dependent  parent.: No Description Entered (Status: improved). Hypervigilance (e.g., feeling constantly on edge,  experiencing concentration difficulties, having trouble falling or staying asleep, exhibiting a general  state of irritability).: No Description Entered (Status: improved). Motor tension (e.g., restlessness,  tiredness, shakiness, muscle tension).: No Description Entered (Status: improved).  Problems Addressed  Anxiety, Phase Of Life Problems, Anxiety  Goals 1. Learn and implement coping skills that result in a reduction of anxiety  and worry, and improved daily functioning. Objective Learn and implement calming skills to reduce overall anxiety and manage anxiety symptoms. Target Date: 2025-08-09Frequency: Weekly  Progress: 40 Modality: individual  Related Interventions 1. Teach the client calming/relaxation skills (e.g., applied relaxation, progressive muscle  relaxation, cue controlled relaxation; mindful breathing; biofeedback) and how to discriminate  better between relaxation and tension; teach the client how to apply  these skills to his/her daily  life (e.g., New Directions in Progressive Muscle Relaxation by Fara Hone, and  Hazlett-Stevens; Treating Generalized Anxiety Disorder by Rygh and Joya Nissen). Objective Identify, challenge, and replace biased, fearful self-talk with positive, realistic, and empowering selftalk. Target Date: 2024-05-26 Frequency: weekly Progress: 30 Modality: individual Related Interventions 1. Explore the client's schema and self-talk that mediate his/her fear response; assist him/her in  challenging the biases; replace the distorted messages with reality-based alternatives and  positive, realistic self-talk that will increase his/her self-confidence in coping with irrational  fears (see Cognitive Therapy of Anxiety Disorders by Anderson Kaufman). Objective Learn and implement problem-solving strategies for realistically addressing worries. Target Date: 2025-08-09Frequency: weekly Progress: 40 Modality: individual 2. Resolve conflicted feelings and adapt to the new life circumstances. Objective Apply problem-solving skills to current circumstances. Target Date: 2024-05-26 Frequency: weekly Progress: 20 Modality: individual Related Interventions 1. Teach the client problem-resolution skills (e.g., defining the problem clearly, brainstorming  multiple solutions, listing the pros and cons of each solution, seeking input from others,  selecting and implementing a plan of action, evaluating outcome, and readjusting plan as  necessary).   3. Stabilize anxiety level while increasing ability to function on a daily  basis. Diagnosis F33.1  Major depressive disorder, moderate 300.02 (Generalized anxiety disorder) - Open - [Signifier: n/a]  Axis  none 309.28 (Adjustment disorder with mixed anxiety and depressed  mood) - Open - [Signifier: n/a]  Adjustment Disorder,  With Anxiety   Marital conflict  Major Depressive disorder, moderate  Conditions For  Discharge Achievement of treatment goals and objectives.  The patient approved this plan.   [name], LCSW

## 2024-03-15 ENCOUNTER — Ambulatory Visit: Admitting: Physical Therapy

## 2024-03-15 ENCOUNTER — Ambulatory Visit: Attending: Neurology

## 2024-03-15 ENCOUNTER — Ambulatory Visit: Admitting: Occupational Therapy

## 2024-03-15 VITALS — BP 108/71 | HR 80

## 2024-03-15 DIAGNOSIS — M6281 Muscle weakness (generalized): Secondary | ICD-10-CM

## 2024-03-15 DIAGNOSIS — R29898 Other symptoms and signs involving the musculoskeletal system: Secondary | ICD-10-CM

## 2024-03-15 DIAGNOSIS — R2681 Unsteadiness on feet: Secondary | ICD-10-CM

## 2024-03-15 DIAGNOSIS — R29818 Other symptoms and signs involving the nervous system: Secondary | ICD-10-CM

## 2024-03-15 DIAGNOSIS — R278 Other lack of coordination: Secondary | ICD-10-CM

## 2024-03-15 DIAGNOSIS — I639 Cerebral infarction, unspecified: Secondary | ICD-10-CM

## 2024-03-15 DIAGNOSIS — R002 Palpitations: Secondary | ICD-10-CM

## 2024-03-15 DIAGNOSIS — I69354 Hemiplegia and hemiparesis following cerebral infarction affecting left non-dominant side: Secondary | ICD-10-CM

## 2024-03-15 DIAGNOSIS — R2689 Other abnormalities of gait and mobility: Secondary | ICD-10-CM

## 2024-03-15 NOTE — Therapy (Signed)
 OUTPATIENT OCCUPATIONAL THERAPY NEURO TREATMENT  Patient Name: Faith Jordan MRN: [id number] DOB:[birth year], 28 y.o., female Today's Date: 03/15/2024  PCP: Gabriel John, NP  REFERRING PROVIDER: Genetta Kenning, MD   END OF SESSION:  OT End of Session - 03/15/24 0955     Visit Number 11    Number of Visits 13   including eval   Date for OT Re-Evaluation 04/06/24    Authorization Type [employer] [facility]) 2025    OT Start Time 0801    OT Stop Time 0846    OT Time Calculation (min) 45 min    Activity Tolerance Patient tolerated treatment well    Behavior During Therapy [facility] for tasks assessed/performed              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   Acute flank pain 06/02/2022   Angio-edema 04/27/2016   Angioedema    Aquagenic angio-edema-urticaria    Asthma    no problems /not used recently   Dysautonomia (HCC)    Dysrhythmia    sinus tachycardia   Fibroid    right breast, adenoma   Headache(784.0)    History of COVID-19 11/21/2020   Hives 12/22/2015   Pneumonia    hx  6th grade   Sensation of fullness in both ears 02/02/2023   Snoring 02/11/2022   Urinary frequency 02/12/2019   Vaginal yeast infection 06/02/2022   Vasculitis (HCC)    Past Surgical History:  Procedure Laterality Date   ADENOIDECTOMY     BREAST LUMPECTOMY Right    BREAST LUMPECTOMY Right    MASS EXCISION Right 10/07/2014   Procedure: EXCISION OF RIGHT BREAST MASS;  Surgeon: Adalberto Hollow, MD;  Location: [facility] OR;  Service: General;  Laterality: Right;   TONSILLECTOMY AND ADENOIDECTOMY     TOOTH EXTRACTION     TRANSESOPHAGEAL ECHOCARDIOGRAM (CATH LAB) N/A 01/10/2024   Procedure: TRANSESOPHAGEAL ECHOCARDIOGRAM;  Surgeon: Hugh Madura, MD;  Location: [facility] INVASIVE CV LAB;  Service: Cardiovascular;  Laterality: N/A;   Patient Active Problem List   Diagnosis Date Noted   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) 02/10/2024   Coping style affecting medical condition 01/19/2024   History of CVA (cerebrovascular accident) 01/12/2024   Stroke of right basal ganglia (HCC) 01/07/2024   Non-restorative sleep 05/26/2022   Sleep paralysis, 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   Vivid dream 05/26/2022   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   Excessive daytime sleepiness 05/26/2022   Sleep disturbance 03/17/2022   Panic attacks 02/11/2022   Vitamin B12 deficiency 07/30/2021   Vitamin D  deficiency 07/30/2021   Family history of hypothyroidism 07/30/2021   GAD (generalized anxiety disorder) 03/18/2020   Fatigue 11/06/2019   Chronic back pain 07/06/2019   Preventative health care 04/27/2018   Migraines 04/25/2018   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   Anemia 01/04/2018   Headache disorder 12/29/2015   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   Sinus tachycardia 12/22/2015   Dysautonomia (HCC) 12/22/2015   Dysautonomia, familial (HCC) 04/22/2015   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   Neurocardiogenic syncope 04/06/2013   Intermittent palpitations 04/06/2013    ONSET DATE: 01/26/2024 (referral date)  REFERRING DIAG: I63.81 (ICD-10-CM) - Other cerebral infarction due to occlusion or stenosis of small artery   THERAPY DIAG:  Other symptoms and signs involving the nervous system  Other symptoms and signs involving the musculoskeletal system  Other lack of coordination  Hemiplegia  and hemiparesis following cerebral infarction affecting left non-dominant side (HCC)  Muscle weakness (generalized)  Rationale for Evaluation and Treatment: Rehabilitation  SUBJECTIVE:   SUBJECTIVE STATEMENT: Pronunciation: "Bree-Anne-ah"  Pt reported "it's fine" overall. Pt reported "I did the putty yesterday." Pt reported completing 2 sets of 10 reps "I just did extra." Pt reported "I hurt my pinky a little bit, so I stopped."  Pt accompanied by: self   PERTINENT HISTORY: familiar  hemiplegic migraine, angioedema 2017,dysautonomia; infarct right internal capsule/ R basal ganglia stroke   MRI of brain on 01/07/24   IMPRESSION: 1. 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 2. Susceptibility related calcifications in the globus pallidus bilaterally. This likely reflects the sequela of chronic microvascular ischemia.  PRECAUTIONS: Fall, Per 02/10/24 MD Office Visit: Pt may gradually begin to return to driving.   WEIGHT BEARING RESTRICTIONS: No  PAIN:  Are you having pain? No  FALLS: Has patient fallen in last 6 months? Yes when CVA event occurred.  03/05/24 - Pt reported recent fall after trying to get clothes out of dryer. Pt denied injuries and able to ind get up after fall.   LIVING ENVIRONMENT: Lives with: lives with their family Lives in: House/apartment Stairs: Yes: External: 4 steps; bilateral but cannot reach both Has following equipment at home: shower chair  PLOF: Independent, typically living ind, worked as Engineer, civil (consulting)  PATIENT GOALS: "I want to get my hand and arm back and be able to go to work."  OBJECTIVE:  Note: Objective measures were completed at Evaluation unless otherwise noted.  HAND DOMINANCE: Right  ADLs: Overall ADLs:  Transfers/ambulation related to ADLs: Eating: ind Grooming: difficulty with completing hair styling e.g. putting hair up in ponytail, difficulty with curling hair. Uses RUE to complete toothbrushing and brushing hair.  Difficulty with clipping fingernails. UB Dressing: ind, extra time. Buttons and zippers difficult.  LB Dressing: Ind, difficulty with pulling up pants and buttoning buttons on jeans, extra time for tying shoes Toileting: ind  Bathing: difficulty washing hair d/t unable to apply pressure while moving LUE, typically uses RUE to ensure thoroughness, shower bench is available PRN Tub Shower transfers: walk-in shower, pt reported SOB after showering or when walking a  lot, sometimes requires rest breaks Equipment: Shower seat with back and Walk in shower  IADLs: Shopping: assistance from parents but pt participates, such as trying to reach for items on shelf and pushing cart Light housekeeping: pt completes dishwashing, has not attempted laundry Meal Prep: pt reported cooking but task is tiring Community mobility: has attempted to drive 1x with parent  Handwriting: No change per pt.   Work activities: Pt expressed concerns with FM coordination and concerns about return to work considerations, such as coordination required for drawing up syringes from a vial for medication administration, completing IVs, typing, navigating phone.   MOBILITY STATUS: Independent  POSTURE COMMENTS:  Ind sitting balance, sometimes forward shoulder posture  ACTIVITY TOLERANCE: Activity tolerance: Pt reported "I'm doing more than I was doing in the hospital, but I definitely get tired easier and take more breaks."  FUNCTIONAL OUTCOME MEASURES: PSF: 3.0    Total score = sum of the activity scores/number of activities Minimum detectable change (90%CI) for average score = 2 points Minimum detectable change (90%CI) for single activity score = 3 points   03/15/24 - PSFS: 7.0    UPPER EXTREMITY ROM:    Active ROM Right eval Left eval  Shoulder flexion [facility]  Shoulder abduction  [facility]  Shoulder adduction    Shoulder extension    Shoulder internal rotation    Shoulder external rotation    Elbow flexion  WFL - slightly delayed movements  Elbow extension    Wrist flexion    Wrist extension    Wrist ulnar deviation    Wrist radial deviation    Wrist pronation    Wrist supination    (Blank rows = not tested)  OT palpated pt's L shoulder and noted "tightness" of muscles at L shoulder.   UPPER EXTREMITY MMT:     MMT Right eval Left eval  Shoulder flexion 5 3+   HAND FUNCTION: Grip strength: Right: 48.9, 53.5, 53.3 (51.9 lbs average) lbs; Left: 24.2,  22.2, 22.7 (23.1 lbs average) lbs  COORDINATION: 9 Hole Peg test: Right: 20 sec; Left: 40 sec  Pt reported practicing picking up pills, sorting change, and playing connect-4 at home.  SENSATION: Pt reported tingling in L thumb which has since resolves. Pt questioned if symptoms might have been from resting hand splint at night. Pt reported no other symptoms of numbness/tingling.  EDEMA: None noted  MUSCLE TONE: RUE: Within functional limits and LUE: slight ataxia  COGNITION: Overall cognitive status: Within functional limits for tasks assessed  Pt reported some difficulty with word-finding.  Pt reported sometimes asking same question again "a few hours apart." Pt's mother reported not noticing this recently.  VISION: Subjective report: Pt reported initial changes to vision (unable to read, blurry vision close-up and far away) though "okay now."  Baseline vision: No visual deficits Visual history: none noted  VISION ASSESSMENT: Pt accurately clock on wall and reported reading books on Kindle. Pt reported still using large print on phone since hospital.   Pt tracked easily to all 4 quadrants. Peripheral vision - WFL B sides, ?slightly decreased on L side though functional regardless  Patient has difficulty with following activities due to following visual impairments: None noted  PERCEPTION: Not tested  PRAXIS: Not tested  OBSERVATIONS: Pt ambulated ind. Pt soft-spoken and appeared tired during session. Pt was pleasant and appeared motivated to complete LUE functional tasks at home based on pt report.                                                                                                                             TREATMENT DATE:    Self-Care OT recommended to avoid pain when completing isolated tip pinch during theraputty tasks by completing reps as prescribed and decreasing force applied to theraputty using pinky finger. Pt acknowledged understanding.  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 OT educated that pt would need to f/u with doctor for final work determination, and pt verbalized understanding. OT and pt discussed additional nursing setting options to decrease stress and general nursing tasks. 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  OT educated pt on importance of incorporating affected LUE into functional tasks and practicing tasks which are challenging to improve LUE strength and coordination. Pt acknowledged understanding.    TherAct OT assessed pt's progress towards goals, see below for updates.   Typing games and typing tests - to improve FM coordination and dexterity and B integration as needed for return-to-work considerations and typing tasks. Noted that pt avoided using BUE digit 5 unless provided with v/c. Noted that pt demo'd decreased speed and efficiency when incorporating digit 5. Pt reported unsure of PLOF whether incorporating pinky or not.  1-minute typing test - Without incorporation of digit 5 : 53 WPM with 95% accuracy.  3-minute typing test - Without incorporation of digit 5: 50 WPM with 95%. Pt reported slight fatigue of L hand after task.   1-minute typing test - Incorporating digit 5: 32 WPM with 94% accuracy.  OT recommended to pt to practice typing daily using keyboard similar to keyboard at work if available. Pt acknowledged understanding.   Squiz at upright mirror, standing with gait belt - to work on static standing balance, LUE coordination and functional reach, LUE endurance. Pt tolerated task well.    TherEx HEP update: LUE overhead reaching - to improve LUE endurance, activity tolerance, ROM, functional overhead reaching. Pt returned demo: Access Code: [email or web address] URL: [email or web address] Date: 03/15/2024 Prepared by: Daina Drum  Exercises - Standing Wall Ball Circles OR  alphabet with Mini Swiss Ball  - 1 x daily - 1 sets - 10 reps circles clockwise and 10 reps cirlces counterclockwise, OR 1 set of alphabet. - Standing shoulder flexion wall slides  - 1 x daily - 2 sets - 10 reps  OT emphasized quality of motion, avoiding compensatory movements, taking breaks PRN. Pt acknowledged understanding.   PATIENT EDUCATION: Education details: see today's tx above Person educated: Patient Education method: Explanation, Demonstration, Verbal cues, and Handouts Education comprehension: verbalized understanding, returned demonstration, verbal cues required, and needs further education  HOME EXERCISE PROGRAM: 02/09/24 - Affected UE ROM and Yellow Theraputty. Access Code: [email or web address]. 02/15/2024: L handed typing words 02/21/24 - Digit ext with theraputty or rubber band (access code: [email or web address]). Functional reach: Handwritten/Verbal instructions: 1 set in front of mirror, 1 set without mirror: Reaching overhead to touch top of head with affected hand, reaching overhead to touch back of head with affected hand, reaching arm forward and touching nose with affected UE - 10 reps, 2 sets, 2x per day. 03/07/2024: Theraband; dowel 03/13/24 - graded up to pink theraputty. Reps/sets adjusted: Access Code: [email or web address]. 03/15/24 - Verbal instructions: Daily typing practice. LUE overhead reaching - Access Code: [email or web address]  GOALS: Goals reviewed with patient? Yes  SHORT TERM GOALS: Target date: 03/09/24  Pt will ind demo understanding of HEP using visual handouts. Baseline: new to outpt OT Goal status: in progress  2.  Pt will ind recall at least 3 energy conservation strategies. Baseline: Activity tolerance: Pt reported "I'm doing more than I was doing in the hospital, but I definitely get tired easier and take more breaks."  03/13/24 - Pt ind recalled breathing, spacing out activities throughout the day, naps, taking breaks PRN.  Goal status: MET  3.  Pt and/or family will return demo of personal  e-stim unit use and electrode placement for affected UE.  Baseline: pt reported she has ordered a personal e-stim unit.  02/09/24 - Education and handout provided. Pt returned demo of using personal e-stim unit for wrist flex/ext of affected UE. Goal status: MET  4.  Pt will report improved ability to use LUE during overhead reaching tasks with minimal use of RUE to compensate, such as hair care tasks and washing hair. Baseline: pt reported difficulty with hair styling (e.g. putting hair in ponytail, curling hair) 03/13/24 - Pt reported continued difficulty with putting hair in a bun and washing hair. Goal status: in progress  LONG TERM GOALS: Target date: 04/06/24  Patient will report at least two-point increase in average PSFS score or at least three-point increase in a single activity score indicating functionally significant improvement given minimum detectable change using adaptive strategies and A/E PRN. Baseline: PSF: 3.0 total score (See above for individual activity scores)  03/15/24 - PSFS: 7.0 Goal status: MET and ongoing  2.  Patient will demonstrate at least  37 lbs LUE grip strength as needed to open jars and other containers.  Baseline: Grip strength: Right: 48.9, 53.5, 53.3 (51.9 lbs average) lbs; Left: 24.2, 22.2, 22.7 (23.1 lbs average) lbs 03/07/2024: LUE - 33.1 lbs Goal status: MET on 03/07/24 and revised on 03/13/24  3.  Patient will demo improved FM coordination as evidenced by completing nine-hole peg with use of LUE in 30 seconds or less.  Baseline: 9 Hole Peg test: Right: 20 sec; Left: 40 sec 03/07/2024: 34 sec Goal status: in progress  4.  Pt will verbalize understanding of A/E, adaptive strategies for functional tasks as needed for return to work considerations. Baseline: Work activities: Pt expressed concerns with FM coordination and concerns about return to work considerations, such as coordination required for drawing up syringes from a vial for medication  administration, completing IVs, typing, navigating phone. Goal status: in progress  ASSESSMENT:  CLINICAL IMPRESSION: Pt met 1 LTG today. Pt demo'ing steady progress towards goals. Pt demo'd good speed with typing though pt reported typing still feels slow compared to PLOF. Pt benefited from verbal reassurance and OT recommended daily typing practice. Pt acknowledged understanding of all education today. Pt tolerated tasks well, and benefited from cues for postural correction and motor control with LUE movements to emphasize quality of motion.   PERFORMANCE DEFICITS: in functional skills including ADLs, IADLs, coordination, dexterity, proprioception, sensation, tone, ROM, strength, pain, muscle spasms, flexibility, Fine motor control, Gross motor control, mobility, balance, endurance, and UE functional use, cognitive skills including attention and energy/drive, and psychosocial skills including environmental adaptation.   IMPAIRMENTS: are limiting patient from ADLs, IADLs, rest and sleep, work, leisure, and social participation.   CO-MORBIDITIES: may have co-morbidities  that affects occupational performance. Patient will benefit from skilled OT to address above impairments and improve overall function.  REHAB POTENTIAL: Good  PLAN:  OT FREQUENCY: 2x/week  OT DURATION: 6 weeks (dates extended to allow for scheduling)  PLANNED INTERVENTIONS: 97168 OT Re-evaluation, 97535 self care/ADL training, 57846 therapeutic exercise, 97530 therapeutic activity, 97112 neuromuscular re-education, 97140 manual therapy, 97035 ultrasound, 97018 paraffin, 96295 fluidotherapy, 97010 moist heat, 97010 cryotherapy, 97760 Orthotic Initial, 97761 Prosthetic Initial, 97763 Orthotic/Prosthetic subsequent, passive range of motion, functional mobility training, visual/perceptual remediation/compensation, energy conservation, patient/family education, and DME and/or AE instructions  RECOMMENDED OTHER SERVICES: PT eval  completed, SLP eval scheduled  CONSULTED AND AGREED WITH PLAN OF CARE: Patient and family member  PLAN FOR NEXT SESSION:   Review theraband and dowel HEPs PRN  Functional use of LUE: how is typing going? Hair styling?  Review ROM and theraputty HEP PRN FM coordination and functional reaching activities - e.g. Blaze Pods, small pegs in-hand manipulation, thumb ROM Overhead strengthening -  e.g. Grayland Le at [employer], Pegs at upright mirror, Celeste Cola at wall [employer])  Re-apply kinesiotape to thumb and consider ?handout, ?have pt/family video with pt's personal cell phone (with pt permission) to improve carryover to home environment  UBE/SciFit for endurance  Oakley Bellman, OT 03/15/2024, 10:05 AM

## 2024-03-15 NOTE — Therapy (Signed)
 OUTPATIENT PHYSICAL THERAPY NEURO TREATMENT - RECERTIFICATION   Patient Name: Faith Jordan MRN: [id number] DOB:[birth year], 28 y.o., female Today's Date: 03/15/2024   PCP: Gabriel John, NP REFERRING PROVIDER: Genetta Kenning, MD   END OF SESSION:  PT End of Session - 03/15/24 0848     Visit Number 13    Number of Visits 25   recert   Date for PT Re-Evaluation 05/10/24   recert, to allow for scheduling delays   Authorization Type Garth Kansky    PT Start Time 0845    PT Stop Time [id number]    PT Time Calculation (min) 43 min    Equipment Utilized During Treatment Gait belt    Activity Tolerance Patient tolerated treatment well    Behavior During Therapy [facility] for tasks assessed/performed                     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   Acute flank pain 06/02/2022   Angio-edema 04/27/2016   Angioedema    Aquagenic angio-edema-urticaria    Asthma    no problems /not used recently   Dysautonomia (HCC)    Dysrhythmia    sinus tachycardia   Fibroid    right breast, adenoma   Headache(784.0)    History of COVID-19 11/21/2020   Hives 12/22/2015   Pneumonia    hx  6th grade   Sensation of fullness in both ears 02/02/2023   Snoring 02/11/2022   Urinary frequency 02/12/2019   Vaginal yeast infection 06/02/2022   Vasculitis (HCC)    Past Surgical History:  Procedure Laterality Date   ADENOIDECTOMY     BREAST LUMPECTOMY Right    BREAST LUMPECTOMY Right    MASS EXCISION Right 10/07/2014   Procedure: EXCISION OF RIGHT BREAST MASS;  Surgeon: Adalberto Hollow, MD;  Location: [facility] OR;  Service: General;  Laterality: Right;   TONSILLECTOMY AND ADENOIDECTOMY     TOOTH EXTRACTION     TRANSESOPHAGEAL ECHOCARDIOGRAM (CATH LAB) N/A 01/10/2024   Procedure: TRANSESOPHAGEAL ECHOCARDIOGRAM;  Surgeon: Hugh Madura, MD;  Location: [facility] INVASIVE CV LAB;  Service: Cardiovascular;  Laterality: N/A;   Patient  Active Problem List   Diagnosis Date Noted   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) 02/10/2024   Coping style affecting medical condition 01/19/2024   History of CVA (cerebrovascular accident) 01/12/2024   Stroke of right basal ganglia (HCC) 01/07/2024   Non-restorative sleep 05/26/2022   Sleep paralysis, 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   Vivid dream 05/26/2022   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   Excessive daytime sleepiness 05/26/2022   Sleep disturbance 03/17/2022   Panic attacks 02/11/2022   Vitamin B12 deficiency 07/30/2021   Vitamin D  deficiency 07/30/2021   Family history of hypothyroidism 07/30/2021   GAD (generalized anxiety disorder) 03/18/2020   Fatigue 11/06/2019   Chronic back pain 07/06/2019   Preventative health care 04/27/2018   Migraines 04/25/2018   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   Anemia 01/04/2018   Headache disorder 12/29/2015   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   Sinus tachycardia 12/22/2015   Dysautonomia (HCC) 12/22/2015   Dysautonomia, familial (HCC) 04/22/2015   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   Neurocardiogenic syncope 04/06/2013   Intermittent palpitations 04/06/2013    ONSET DATE: 01/26/2024 (referral)   REFERRING DIAG:  I63.81 (ICD-10-CM) - 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 Pt moving  back to her townhouse tomorrow.  Pt accompanied by: Mother  PERTINENT HISTORY: familiar hemiplegic migraine, angioedema 2017,dysautonomia; infarct right internal capsule/ R basal ganglia stroke  PAIN:  Are you having pain? No    PRECAUTIONS: Fall  RED FLAGS: None   WEIGHT BEARING RESTRICTIONS: No  FALLS: Has patient fallen in last 6 months? No  LIVING ENVIRONMENT: Lives with: lives with their family Lives in: House/apartment Stairs: Yes: External: 4 steps; bilateral but cannot reach both Has following equipment at home: shower chair  PLOF: Independent  PATIENT GOALS: "Being normal again. Being able to go back to work and be independent"   OBJECTIVE:  Note: Objective measures were completed at Evaluation unless otherwise noted.  DIAGNOSTIC FINDINGS: MRI of brain on 01/07/24  IMPRESSION: 1. 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 2. Susceptibility related calcifications in the globus pallidus bilaterally. This likely reflects the sequela of chronic microvascular ischemia.  COGNITION: Overall cognitive status: Within functional limits for tasks assessed   SENSATION: Pt denies numbness/tingling in L hemibody   MUSCLE TONE: Non-fatiguing clonus of LLE  POSTURE: No Significant postural limitations  LOWER EXTREMITY ROM:     Active  Right Eval Left Eval  Hip flexion    Hip extension    Hip abduction    Hip adduction    Hip internal rotation    Hip external rotation    Knee flexion    Knee extension    Ankle dorsiflexion    Ankle plantarflexion    Ankle inversion    Ankle eversion     (Blank rows = not tested)  LOWER EXTREMITY MMT:  Tested in seated position   MMT Right Eval Left Eval  Hip flexion 5 4+  Hip extension    Hip abduction 5 5  Hip adduction 5 5  Hip internal rotation    Hip external rotation    Knee flexion 5 5  Knee extension 5 4+  Ankle dorsiflexion 5 5  Ankle plantarflexion     Ankle inversion  Ankle eversion    (Blank rows = not tested)  BED MOBILITY:  Not tested  TRANSFERS: Sit to stand: Complete Independence  Assistive device utilized: None     Stand to sit: Complete Independence  Assistive device utilized: None      RAMP:  Not tested  CURB:  Not tested  GAIT: Gait pattern: step through pattern, decreased arm swing- Left, decreased step length- Right, decreased stance time- Left, decreased stride length, decreased hip/knee flexion- Left, lateral hip instability, and wide BOS Distance walked: Various clinic distances  Assistive device utilized: None Level of assistance: Modified independence Comments: Pt walks very guarded, as if she feels unstable. No instability noted other than single instance of L knee buckling which pt able to self-correct.    PATIENT SURVEYS:  ABC scale to be obtained    VITALS  Vitals:   03/15/24 0851  BP: 108/71  Pulse: 80                                                                                                                                 TREATMENT:    Self-Care Assessed vitals (see above) and BP on lower end but WNL for session.    NMR The following activities were performed for improved dynamic balance, multi-tasking, and to work on balance reactions: Resisted gait with multidirectional perturbations: X 115 ft with CGA X 115 ft while carrying 10# KB with CGA X 115 ft while carrying 15# surge with CGA to min A   Physical Performance For LTG assessment: ABC: 90% confidence HiMat: 21/54 Pt able to navigate up/down 4 steps while carrying 10# KB at mod I level, no LOB Pt able to perform a [place] getup with a 5# KB Initially needs min A due to LOB when returning from half-kneel to standing Pt only needs CGA during 2nd rep due to improved initial standing balance   [facility] PT Assessment - 03/15/24 0857       Ambulation/Gait   Gait velocity 32.8 ft over 9.18 sec = 3.57 ft/sec               PATIENT EDUCATION: Education details: continue to work on [employer] recommendations, results of OM and functional implications, discussed patient's goals for PT going forwards Person educated: Patient and Parent Education method: Explanation, Demonstration, and Verbal cues Education comprehension: verbalized understanding, returned demonstration, verbal cues required, and needs further education  HOME EXERCISE PROGRAM: Access Code: [email or web address] URL: [email or web address] Date: 02/13/2024 Prepared by: Burleigh Carp Plaster  Exercises - Half Kneeling Lift  - 1 x daily - 7 x weekly - 1 sets - 10 reps - Down Dog with Bent Knees and Heels on Mat  - 1 x daily - 7 x weekly - 2-3 reps - 15-30 seconds hold - Bird Dog  - 1 x daily - 7 x weekly - 3 sets - 10 reps - Lateral Shuffles  - 1 x daily - 7 x weekly -  3 sets - 10 reps - Braided Sidestepping  - 1 x daily - 7 x weekly - 3 sets - 10 reps - Standing Balance Activity: Plyometric Modified Lower Extremity Jumping Jack  - 1 x daily - 7 x weekly - 3 sets - 10 reps - Standing Forward Step Taps with Counter Support  - 1 x daily - 7 x weekly - 3 sets - 10 reps (at bottom of steps with no UE support, red TB around ankles) - Ankle Inversion with Resistance  - 1 x daily - 7 x weekly - 3 sets - 10 reps  From CIR Therapist: Also instructed pt in 2 additional exercises but no printed versions available: - tall kneeling to half kneeling - sit to half kneeling and then return to sit. Perform to each side.   Verbally added 02/21/24: -squats at countertop with chair behind you  Verbally added 03/13/24: - eccentric heel taps  - jumping over line fwd/retro/laterally   GOALS: Goals reviewed with patient? Yes  SHORT TERM GOALS: Target date: 02/28/2024  Pt will improve FGA to 28/30 for decreased fall risk  Baseline: 21/30, 25/30 (5/13) Goal status: IN PROGRESS  2.  HiMat to be assessed at STG check and LTG updated  Baseline: 11/54 (5/13) Goal status:  MET  3.  Pt will improve gait velocity to at least 3.4 ft/s for improved gait efficiency and return to PLOF Baseline: 3.0 ft/sec (eval), 3.31 ft/sec (5/13) Goal status: IN PROGRESS  4.  ABC scale to be assessed and LTG updated  Baseline: 70.6% (5/13) Goal status: MET  5.  Pt will demonstrate proper lifting technique of 25# weight from floor for proper body mechanics and safety at work Baseline:  Goal status: MET  LONG TERM GOALS: Target date: 03/13/2024  Pt will improve gait velocity to at least 3.5 ft/s for improved gait efficiency and independence Baseline: 3.0 ft/sec (eval), 3.31 ft/sec (5/13), 3.57 ft/sec (5/29) Goal status: MET  2.  Pt will improve her score on the HiMat to >/= 16/54 to demonstrate improved balance and function Baseline: 11/54 (5/13), 21/54 (5/29) Goal status: MET  3.  Pt will improve her ABC scale score to >/= 80% for improved confidence in her balance. Baseline: 70.6% (5/13), 90% (5/29) Goal status: MET  4.  Pt will be able to carry 10# object in BUEs up/down 4 steps for improved coordination and safety in work environment   Baseline: can only carry w/single UE, can carry with BUE mod I (5/29) Goal status: MET  5.  Pt will perform [place] get up w/5# weight for improved shoulder stability OH, core stability and high level balance  Baseline: CGA to min A to perform (5/29) Goal status: MET  NEW SHORT TERM GOALS:   Target date: 04/05/2024  Pt will improve her score on the HiMat to >/= 26/54 to demonstrate improved balance and function Baseline: 11/54 (5/13), 21/54 (5/29) Goal status: INITIAL  2.  Pt will be able to perform 5 deep squats and return to standing to demonstrate improved function Baseline: 2 squats before unable to stand up without physical assist Goal status: INITIAL  3.  Manual TUG to be assessed and LTG set Baseline: to be assessed Goal status: INITIAL  4.  Cognitive TUG to be assessed and LTG set Baseline: to be assessed Goal  status: INITIAL    NEW LONG TERM GOALS:  Target date: 04/26/2024  Pt will improve gait velocity to at least 3.75 ft/s for improved gait efficiency and  independence Baseline: 3.0 ft/sec (eval), 3.31 ft/sec (5/13), 3.57 ft/sec (5/29) Goal status: INITIAL  2.  Pt will improve her score on the HiMat to >/= 31/54 to demonstrate improved balance and function Baseline: 11/54 (5/13), 21/54 (5/29) Goal status: INITIAL  3.  Pt will be able to perform 10 deep squats and return to standing to demonstrate improved function Baseline: 2 squats before unable to stand up without physical assist Goal status: INITIAL  4.  Manual TUG to be assessed and LTG set Baseline: to be assessed Goal status: INITIAL  5.  Cognitive TUG to be assessed and LTG set Baseline: to be assessed Goal status: INITIAL      ASSESSMENT:  CLINICAL IMPRESSION: Emphasis of skilled PT session on assessing LTG and creating new STG/LTG for recertification of PT services this date. Pt has met 5/5 LTG due to improving her gait speed to community ambulator levels, improving her score on the HiMat with improved ability to run and to skip this date, improving her confidence in her balance with an improved ABC score, and demonstrating ability to navigate functional environment while carrying weight with BUE and demonstrate overall good functional strength and initial standing balance with ability to perform a [place] getup. She continues to struggle with multi-tasking and dual cognitive tasking, standing back up from deep squats, maintaining her balance against resistance/perturbations, and performing tasks quickly based on her own assessment and feedback. She continues to benefit from skilled PT services to work towards returning to her PLOF as safe and able. Continue POC.    OBJECTIVE IMPAIRMENTS: Abnormal gait, decreased activity tolerance, decreased balance, decreased coordination, decreased endurance, decreased mobility, decreased  strength, increased muscle spasms, impaired UE functional use, improper body mechanics, and pain  ACTIVITY LIMITATIONS: carrying, lifting, squatting, stairs, reach over head, hygiene/grooming, and locomotion level  PARTICIPATION LIMITATIONS: meal prep, cleaning, laundry, interpersonal relationship, driving, shopping, community activity, occupation, and yard work  PERSONAL FACTORS: 1 comorbidity: R internal capsule infarct/R basal ganglia stroke are also affecting patient's functional outcome.   REHAB POTENTIAL: Excellent  CLINICAL DECISION MAKING: Evolving/moderate complexity  EVALUATION COMPLEXITY: Moderate  PLAN:  PT FREQUENCY: 2-3x/week (starting w/2x following eval)  PT DURATION: 6 weeks  PLANNED INTERVENTIONS: 97164- PT Re-evaluation, 97750- Physical Performance Testing, 97110-Therapeutic exercises, 97530- Therapeutic activity, [id number]- Neuromuscular re-education, 97535- Self Care, 16109- Manual therapy, [phone]- Gait training, [phone]- Canalith repositioning, [phone]- Aquatic Therapy, Patient/Family education, Balance training, Stair training, Taping, Dry Needling, Joint mobilization, Spinal mobilization, Vestibular training, and DME instructions  PLAN FOR NEXT SESSION: assess manual TUG and cognitive TUG (count by 7's or 8's) and write LTG, High level agility, elliptical, L quad eccentric strength, OH tasks. Practice w/plastic needles Sonia Durand has these) in oranges and pin the needle to targets? Cognitive dual tasking, needs to work on deep squats and return to standing from squats, agility drills, treadmill training with focus on increasing speed and potentially running? Half kneel, ladder drills, carrying weight with both hands (on rockerboard, stairs), agility ladder. Jumping   Pt goals: squats (for patient care), time based tasks, not lose my balance (would lose balance with perturbations/reactive balance), multitasking/cog dual tasking (talking while doing tasks)   Lorita Rosa,  PT Lorita Rosa, PT, DPT, CSRS   03/15/2024, 9:29 AM

## 2024-03-16 ENCOUNTER — Other Ambulatory Visit: Payer: Self-pay

## 2024-03-16 DIAGNOSIS — R002 Palpitations: Secondary | ICD-10-CM

## 2024-03-16 DIAGNOSIS — I639 Cerebral infarction, unspecified: Secondary | ICD-10-CM | POA: Diagnosis not present

## 2024-03-19 ENCOUNTER — Ambulatory Visit: Payer: Self-pay | Admitting: Neurology

## 2024-03-20 ENCOUNTER — Ambulatory Visit: Admitting: Occupational Therapy

## 2024-03-20 ENCOUNTER — Ambulatory Visit: Attending: Physical Medicine & Rehabilitation | Admitting: Physical Therapy

## 2024-03-20 ENCOUNTER — Ambulatory Visit

## 2024-03-20 DIAGNOSIS — R29818 Other symptoms and signs involving the nervous system: Secondary | ICD-10-CM

## 2024-03-20 DIAGNOSIS — I69354 Hemiplegia and hemiparesis following cerebral infarction affecting left non-dominant side: Secondary | ICD-10-CM | POA: Diagnosis not present

## 2024-03-20 DIAGNOSIS — M6281 Muscle weakness (generalized): Secondary | ICD-10-CM | POA: Diagnosis not present

## 2024-03-20 DIAGNOSIS — R471 Dysarthria and anarthria: Secondary | ICD-10-CM | POA: Insufficient documentation

## 2024-03-20 DIAGNOSIS — I6381 Other cerebral infarction due to occlusion or stenosis of small artery: Secondary | ICD-10-CM | POA: Diagnosis not present

## 2024-03-20 DIAGNOSIS — M5459 Other low back pain: Secondary | ICD-10-CM | POA: Diagnosis not present

## 2024-03-20 DIAGNOSIS — R2689 Other abnormalities of gait and mobility: Secondary | ICD-10-CM | POA: Diagnosis not present

## 2024-03-20 DIAGNOSIS — R29898 Other symptoms and signs involving the musculoskeletal system: Secondary | ICD-10-CM | POA: Diagnosis not present

## 2024-03-20 DIAGNOSIS — R278 Other lack of coordination: Secondary | ICD-10-CM

## 2024-03-20 DIAGNOSIS — R2681 Unsteadiness on feet: Secondary | ICD-10-CM | POA: Insufficient documentation

## 2024-03-20 DIAGNOSIS — R41841 Cognitive communication deficit: Secondary | ICD-10-CM | POA: Insufficient documentation

## 2024-03-20 NOTE — Therapy (Unsigned)
 OUTPATIENT OCCUPATIONAL THERAPY NEURO TREATMENT  Patient Name: Faith Jordan MRN: [id number] DOB:[birth year], 28 y.o., female Today's Date: 03/20/2024  PCP: Gabriel John, NP  REFERRING PROVIDER: Genetta Kenning, MD   END OF SESSION:  OT End of Session - 03/20/24 0847     Visit Number 12    Number of Visits 13   including eval   Date for OT Re-Evaluation 04/06/24    Authorization Type [employer] ([place]) 2025    OT Start Time [phone]    OT Stop Time 0930    OT Time Calculation (min) 43 min    Equipment Utilized During Treatment [employer], Pulley, Puzzle    Activity Tolerance Patient tolerated treatment well    Behavior During Therapy [facility] for tasks assessed/performed              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   Acute flank pain 06/02/2022   Angio-edema 04/27/2016   Angioedema    Aquagenic angio-edema-urticaria    Asthma    no problems /not used recently   Dysautonomia (HCC)    Dysrhythmia    sinus tachycardia   Fibroid    right breast, adenoma   Headache(784.0)    History of COVID-19 11/21/2020   Hives 12/22/2015   Pneumonia    hx  6th grade   Sensation of fullness in both ears 02/02/2023   Snoring 02/11/2022   Urinary frequency 02/12/2019   Vaginal yeast infection 06/02/2022   Vasculitis (HCC)    Past Surgical History:  Procedure Laterality Date   ADENOIDECTOMY     BREAST LUMPECTOMY Right    BREAST LUMPECTOMY Right    MASS EXCISION Right 10/07/2014   Procedure: EXCISION OF RIGHT BREAST MASS;  Surgeon: Adalberto Hollow, MD;  Location: [facility] OR;  Service: General;  Laterality: Right;   TONSILLECTOMY AND ADENOIDECTOMY     TOOTH EXTRACTION     TRANSESOPHAGEAL ECHOCARDIOGRAM (CATH LAB) N/A 01/10/2024   Procedure: TRANSESOPHAGEAL ECHOCARDIOGRAM;  Surgeon: Hugh Madura, MD;  Location: [facility] INVASIVE CV LAB;  Service: Cardiovascular;  Laterality: N/A;   Patient Active Problem List   Diagnosis Date  Noted   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) 02/10/2024   Coping style affecting medical condition 01/19/2024   History of CVA (cerebrovascular accident) 01/12/2024   Stroke of right basal ganglia (HCC) 01/07/2024   Non-restorative sleep 05/26/2022   Sleep paralysis, 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   Vivid dream 05/26/2022   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   Excessive daytime sleepiness 05/26/2022   Sleep disturbance 03/17/2022   Panic attacks 02/11/2022   Vitamin B12 deficiency 07/30/2021   Vitamin D  deficiency 07/30/2021   Family history of hypothyroidism 07/30/2021   GAD (generalized anxiety disorder) 03/18/2020   Fatigue 11/06/2019   Chronic back pain 07/06/2019   Preventative health care 04/27/2018   Migraines 04/25/2018   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   Anemia 01/04/2018   Headache disorder 12/29/2015   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   Sinus tachycardia 12/22/2015   Dysautonomia (HCC) 12/22/2015   Dysautonomia, familial (HCC) 04/22/2015   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   Neurocardiogenic syncope 04/06/2013   Intermittent palpitations 04/06/2013    ONSET DATE: 01/26/2024 (referral date)  REFERRING DIAG: I63.81 (ICD-10-CM) - Other cerebral infarction due to occlusion or stenosis of small artery   THERAPY DIAG:  Other lack of coordination  Muscle weakness (generalized)  Other symptoms and signs  involving the nervous system  Rationale for Evaluation and Treatment: Rehabilitation  SUBJECTIVE:   SUBJECTIVE STATEMENT: Pronunciation: "Bree-Anne-ah"  Pt reports things are going fine, just still having trouble with L hand sometimes.  Pt accompanied by: self   PERTINENT HISTORY: familiar hemiplegic migraine, angioedema 2017,dysautonomia; infarct right internal capsule/ R basal ganglia stroke   MRI of brain on 01/07/24   IMPRESSION: 1. 10 mm acute nonhemorrhagic infarct of the posterior limb  of the right internal capsule. 2. Susceptibility related calcifications in the globus pallidus bilaterally. This likely reflects the sequela of chronic microvascular ischemia.  PRECAUTIONS: Fall, Per 02/10/24 MD Office Visit: Pt may gradually begin to return to driving.   WEIGHT BEARING RESTRICTIONS: No  PAIN:  Are you having pain? No  FALLS: Has patient fallen in last 6 months? Yes when CVA event occurred.  03/05/24 - Pt reported recent fall after trying to get clothes out of dryer. Pt denied injuries and able to ind get up after fall.   LIVING ENVIRONMENT: Lives with: lives with their family Lives in: House/apartment Stairs: Yes: External: 4 steps; bilateral but cannot reach both Has following equipment at home: shower chair  PLOF: Independent, typically living ind, worked as Engineer, civil (consulting)  PATIENT GOALS: "I want to get my hand and arm back and be able to go to work."  OBJECTIVE:  Note: Objective measures were completed at Evaluation unless otherwise noted.  HAND DOMINANCE: Right  ADLs: Overall ADLs:  Transfers/ambulation related to ADLs: Eating: ind Grooming: difficulty with completing hair styling e.g. putting hair up in ponytail, difficulty with curling hair. Uses RUE to complete toothbrushing and brushing hair.  Difficulty with clipping fingernails. UB Dressing: ind, extra time. Buttons and zippers difficult.  LB Dressing: Ind, difficulty with pulling up pants and buttoning buttons on jeans, extra time for tying shoes Toileting: ind  Bathing: difficulty washing hair d/t unable to apply pressure while moving LUE, typically uses RUE to ensure thoroughness, shower bench is available PRN Tub Shower transfers: walk-in shower, pt reported SOB after showering or when walking a lot, sometimes requires rest breaks Equipment: Shower seat with back and Walk in shower  IADLs: Shopping: assistance from parents but pt participates, such as trying to reach for items on shelf and pushing  cart Light housekeeping: pt completes dishwashing, has not attempted laundry Meal Prep: pt reported cooking but task is tiring Community mobility: has attempted to drive 1x with parent  Handwriting: No change per pt.   Work activities: Pt expressed concerns with FM coordination and concerns about return to work considerations, such as coordination required for drawing up syringes from a vial for medication administration, completing IVs, typing, navigating phone.   MOBILITY STATUS: Independent  POSTURE COMMENTS:  Ind sitting balance, sometimes forward shoulder posture  ACTIVITY TOLERANCE: Activity tolerance: Pt reported "I'm doing more than I was doing in the hospital, but I definitely get tired easier and take more breaks."  FUNCTIONAL OUTCOME MEASURES: PSF: 3.0    Total score = sum of the activity scores/number of activities Minimum detectable change (90%CI) for average score = 2 points Minimum detectable change (90%CI) for single activity score = 3 points   03/15/24 - PSFS: 7.0    UPPER EXTREMITY ROM:    Active ROM Right eval Left eval  Shoulder flexion [facility]  Shoulder abduction  [facility]  Shoulder adduction    Shoulder extension    Shoulder internal rotation    Shoulder external rotation    Elbow flexion  WFL -  slightly delayed movements  Elbow extension    Wrist flexion    Wrist extension    Wrist ulnar deviation    Wrist radial deviation    Wrist pronation    Wrist supination    (Blank rows = not tested)  OT palpated pt's L shoulder and noted "tightness" of muscles at L shoulder.   UPPER EXTREMITY MMT:     MMT Right eval Left eval  Shoulder flexion 5 3+   HAND FUNCTION: Grip strength: Right: 48.9, 53.5, 53.3 (51.9 lbs average) lbs; Left: 24.2, 22.2, 22.7 (23.1 lbs average) lbs  COORDINATION: 9 Hole Peg test: Right: 20 sec; Left: 40 sec  Pt reported practicing picking up pills, sorting change, and playing connect-4 at home.  SENSATION: Pt reported  tingling in L thumb which has since resolves. Pt questioned if symptoms might have been from resting hand splint at night. Pt reported no other symptoms of numbness/tingling.  EDEMA: None noted  MUSCLE TONE: RUE: Within functional limits and LUE: slight ataxia  COGNITION: Overall cognitive status: Within functional limits for tasks assessed  Pt reported some difficulty with word-finding.  Pt reported sometimes asking same question again "a few hours apart." Pt's mother reported not noticing this recently.  VISION: Subjective report: Pt reported initial changes to vision (unable to read, blurry vision close-up and far away) though "okay now."  Baseline vision: No visual deficits Visual history: none noted  VISION ASSESSMENT: Pt accurately clock on wall and reported reading books on Kindle. Pt reported still using large print on phone since hospital.   Pt tracked easily to all 4 quadrants. Peripheral vision - WFL B sides, ?slightly decreased on L side though functional regardless  Patient has difficulty with following activities due to following visual impairments: None noted  PERCEPTION: Not tested  PRAXIS: Not tested  OBSERVATIONS: Pt ambulated ind. Pt soft-spoken and appeared tired during session. Pt was pleasant and appeared motivated to complete LUE functional tasks at home based on pt report.                                                                                                                             TODAY'S TREATMENT:    Therapeutic Exercise: Patient shown how to complete various shoulder pulley activities while seated and standing for both shoulder flexion and shoulder abduction/adduction for 5 minutes total to improve left shoulder ROM and endurance.   Therapeutic Activities: Pt participated in Chain game using BUE for eye-hand coordination, pinch strength, visual perceptual skills and cognition for planning and problem solving.  Game required pt to stretch  elastics over 3 pegs to create triangles to 'capture' spaces by placing a game piece inside the completed triangle/s. Pt able to problem solve to create triangles multiple time with min cues to begin and slight physical difficulty with coordination of L UE to stretch elastics.  Slight issue with sensory awareness of lightweight game pieces. Pt was able to perform in hand  manipulation to manage game pieces with extra time and followed directions with verbal and visual to introduce new game/rules. Pt able to play game during social conversation without difficulty.    PATIENT EDUCATION: Education details: Over the Careers information officer ideas Person educated: Patient Education method: Explanation, Demonstration, Verbal cues, and Handouts Education comprehension: verbalized understanding, returned demonstration, verbal cues required, and needs further education  HOME EXERCISE PROGRAM: 02/09/24 - Affected UE ROM and Yellow Theraputty. Access Code: [email or web address]. 02/15/2024: L handed typing words 02/21/24 - Digit ext with theraputty or rubber band (access code: [email or web address]). Functional reach: Handwritten/Verbal instructions: 1 set in front of mirror, 1 set without mirror: Reaching overhead to touch top of head with affected hand, reaching overhead to touch back of head with affected hand, reaching arm forward and touching nose with affected UE - 10 reps, 2 sets, 2x per day. 03/07/2024: Theraband; dowel 03/13/24 - graded up to pink theraputty. Reps/sets adjusted: Access Code: [email or web address]. 03/15/24 - Verbal instructions: Daily typing practice. LUE overhead reaching - Access Code: [email or web address]  GOALS: Goals reviewed with patient? Yes  SHORT TERM GOALS: Target date: 03/09/24  Pt will ind demo understanding of HEP using visual handouts. Baseline: new to outpt OT Goal status: in progress  2.  Pt will ind recall at least 3 energy conservation strategies. Baseline: Activity tolerance: Pt reported "I'm doing more than I was doing in the  hospital, but I definitely get tired easier and take more breaks."  03/13/24 - Pt ind recalled breathing, spacing out activities throughout the day, naps, taking breaks PRN.  Goal status: MET  3.  Pt and/or family will return demo of personal e-stim unit use and electrode placement for affected UE.  Baseline: pt reported she has ordered a personal e-stim unit.  02/09/24 - Education and handout provided. Pt returned demo of using personal e-stim unit for wrist flex/ext of affected UE. Goal status: MET  4.  Pt will report improved ability to use LUE during overhead reaching tasks with minimal use of RUE to compensate, such as hair care tasks and washing hair. Baseline: pt reported difficulty with hair styling (e.g. putting hair in ponytail, curling hair) 03/13/24 - Pt reported continued difficulty with putting hair in a bun and washing hair. Goal status: in progress  LONG TERM GOALS: Target date: 04/06/24  Patient will report at least two-point increase in average PSFS score or at least three-point increase in a single activity score indicating functionally significant improvement given minimum detectable change using adaptive strategies and A/E PRN. Baseline: PSF: 3.0 total score (See above for individual activity scores)  03/15/24 - PSFS: 7.0 Goal status: MET and ongoing  2.  Patient will demonstrate at least  37 lbs LUE grip strength as needed to open jars and other containers.  Baseline: Grip strength: Right: 48.9, 53.5, 53.3 (51.9 lbs average) lbs; Left: 24.2, 22.2, 22.7 (23.1 lbs average) lbs 03/07/2024: LUE - 33.1 lbs Goal status: MET on 03/07/24 and revised on 03/13/24  3.  Patient will demo improved FM coordination as evidenced by completing nine-hole peg with use of LUE in 30 seconds or less.  Baseline: 9 Hole Peg test: Right: 20 sec; Left: 40 sec 03/07/2024: 34 sec Goal status: in progress  4.  Pt will verbalize understanding of A/E, adaptive strategies for functional tasks as needed  for return to work considerations. Baseline: Work activities: Pt expressed concerns with FM coordination and concerns about return to work considerations, such as coordination required for drawing up syringes from a  vial for medication administration, completing IVs, typing, navigating phone. Goal status: in progress  ASSESSMENT:  CLINICAL IMPRESSION: Pt met 1 LTG today. Pt demo'ing steady progress towards goals. Pt demo'd good speed with typing though pt reported typing still feels slow compared to PLOF. Pt benefited from verbal reassurance and OT recommended daily typing practice. Pt acknowledged understanding of all education today. Pt tolerated tasks well, and benefited from cues for postural correction and motor control with LUE movements to emphasize quality of motion.   PERFORMANCE DEFICITS: in functional skills including ADLs, IADLs, coordination, dexterity, proprioception, sensation, tone, ROM, strength, pain, muscle spasms, flexibility, Fine motor control, Gross motor control, mobility, balance, endurance, and UE functional use, cognitive skills including attention and energy/drive, and psychosocial skills including environmental adaptation.   IMPAIRMENTS: are limiting patient from ADLs, IADLs, rest and sleep, work, leisure, and social participation.   CO-MORBIDITIES: may have co-morbidities  that affects occupational performance. Patient will benefit from skilled OT to address above impairments and improve overall function.  REHAB POTENTIAL: Good  PLAN:  OT FREQUENCY: 2x/week  OT DURATION: 6 weeks (dates extended to allow for scheduling)  PLANNED INTERVENTIONS: 97168 OT Re-evaluation, 97535 self care/ADL training, 14782 therapeutic exercise, 97530 therapeutic activity, 97112 neuromuscular re-education, 97140 manual therapy, 97035 ultrasound, 97018 paraffin, 95621 fluidotherapy, 97010 moist heat, 97010 cryotherapy, 97760 Orthotic Initial, 97761 Prosthetic Initial, 97763  Orthotic/Prosthetic subsequent, passive range of motion, functional mobility training, visual/perceptual remediation/compensation, energy conservation, patient/family education, and DME and/or AE instructions  RECOMMENDED OTHER SERVICES: PT eval completed, SLP eval scheduled  CONSULTED AND AGREED WITH PLAN OF CARE: Patient and family member  PLAN FOR NEXT SESSION:   Review theraband and dowel HEPs PRN  Functional use of LUE: how is typing going? Hair styling?  Review ROM and theraputty HEP PRN FM coordination and functional reaching activities - e.g. Blaze Pods, small pegs in-hand manipulation, thumb ROM Overhead strengthening - e.g. Squigz at [employer], Pegs at upright mirror, Ball at wall [employer])  Re-apply kinesiotape to thumb and consider ?handout, ?have pt/family video with pt's personal cell phone (with pt permission) to improve carryover to home environment  UBE/SciFit for endurance  Zora Hires, OT 03/20/2024, 4:59 PM

## 2024-03-20 NOTE — Therapy (Signed)
 OUTPATIENT PHYSICAL THERAPY NEURO TREATMENT    Patient Name: Faith Jordan MRN: [id number] DOB:07-09-1996, 28 y.o., female Today's Date: 03/20/2024   PCP: Gabriel John, NP REFERRING PROVIDER: Genetta Kenning, MD   END OF SESSION:  PT End of Session - 03/20/24 0805     Visit Number 14    Number of Visits 25   recert   Date for PT Re-Evaluation 05/10/24   recert, to allow for scheduling delays   Authorization Type Garth Kansky    PT Start Time 4098    PT Stop Time 0846    PT Time Calculation (min) 42 min    Equipment Utilized During Treatment Gait belt    Activity Tolerance Patient tolerated treatment well    Behavior During Therapy [facility] for tasks assessed/performed                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   Acute flank pain 06/02/2022   Angio-edema 04/27/2016   Angioedema    Aquagenic angio-edema-urticaria    Asthma    no problems /not used recently   Dysautonomia (HCC)    Dysrhythmia    sinus tachycardia   Fibroid    right breast, adenoma   Headache(784.0)    History of COVID-19 11/21/2020   Hives 12/22/2015   Pneumonia    hx  6th grade   Sensation of fullness in both ears 02/02/2023   Snoring 02/11/2022   Urinary frequency 02/12/2019   Vaginal yeast infection 06/02/2022   Vasculitis (HCC)    Past Surgical History:  Procedure Laterality Date   ADENOIDECTOMY     BREAST LUMPECTOMY Right    BREAST LUMPECTOMY Right    MASS EXCISION Right 10/07/2014   Procedure: EXCISION OF RIGHT BREAST MASS;  Surgeon: Adalberto Hollow, MD;  Location: [facility] OR;  Service: General;  Laterality: Right;   TONSILLECTOMY AND ADENOIDECTOMY     TOOTH EXTRACTION     TRANSESOPHAGEAL ECHOCARDIOGRAM (CATH LAB) N/A 01/10/2024   Procedure: TRANSESOPHAGEAL ECHOCARDIOGRAM;  Surgeon: Hugh Madura, MD;  Location: [facility] INVASIVE CV LAB;  Service: Cardiovascular;  Laterality: N/A;   Patient Active Problem List    Diagnosis Date Noted   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) 02/10/2024   Coping style affecting medical condition 01/19/2024   History of CVA (cerebrovascular accident) 01/12/2024   Stroke of right basal ganglia (HCC) 01/07/2024   Non-restorative sleep 05/26/2022   Sleep paralysis, 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   Vivid dream 05/26/2022   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   Excessive daytime sleepiness 05/26/2022   Sleep disturbance 03/17/2022   Panic attacks 02/11/2022   Vitamin B12 deficiency 07/30/2021   Vitamin D  deficiency 07/30/2021   Family history of hypothyroidism 07/30/2021   GAD (generalized anxiety disorder) 03/18/2020   Fatigue 11/06/2019   Chronic back pain 07/06/2019   Preventative health care 04/27/2018   Migraines 04/25/2018   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   Anemia 01/04/2018   Headache disorder 12/29/2015   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   Sinus tachycardia 12/22/2015   Dysautonomia (HCC) 12/22/2015   Dysautonomia, familial (HCC) 04/22/2015   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   Neurocardiogenic syncope 04/06/2013   Intermittent palpitations 04/06/2013    ONSET DATE: 01/26/2024 (referral)   REFERRING DIAG:  I63.81 (ICD-10-CM) - 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 Is going well, no issues. Drove today. Pt reports she feels like she is doing well, is itching to go back to work.   Pt  accompanied by: self  PERTINENT HISTORY: familiar hemiplegic migraine, angioedema 2017,dysautonomia; infarct right internal capsule/ R basal ganglia stroke  PAIN:  Are you having pain? No    PRECAUTIONS: Fall  RED FLAGS: None   WEIGHT BEARING RESTRICTIONS: No  FALLS: Has patient fallen in last 6 months? No  LIVING ENVIRONMENT: Lives with: lives with their family Lives in: House/apartment Stairs: Yes: External: 4 steps; bilateral but cannot reach both Has following equipment at home: shower chair  PLOF: Independent  PATIENT GOALS: "Being normal again. Being able to go back to work and be independent"   OBJECTIVE:  Note: Objective measures were completed at Evaluation unless otherwise noted.  DIAGNOSTIC FINDINGS: MRI of brain on 01/07/24  IMPRESSION: 1. 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 2. Susceptibility related calcifications in the globus pallidus bilaterally. This likely reflects the sequela of chronic microvascular ischemia.  COGNITION: Overall cognitive status: Within functional limits for tasks assessed   SENSATION: Pt denies numbness/tingling in L hemibody   MUSCLE TONE: Non-fatiguing clonus of LLE  POSTURE: No Significant postural limitations  LOWER EXTREMITY ROM:     Active  Right Eval Left Eval  Hip flexion    Hip extension    Hip abduction    Hip adduction    Hip internal rotation    Hip external rotation    Knee flexion    Knee extension    Ankle dorsiflexion    Ankle plantarflexion    Ankle inversion    Ankle eversion     (Blank rows = not tested)  LOWER EXTREMITY MMT:  Tested in seated position   MMT Right Eval Left Eval  Hip flexion 5 4+  Hip extension    Hip abduction 5 5  Hip adduction 5 5  Hip internal rotation    Hip external rotation    Knee flexion 5 5  Knee extension 5 4+  Ankle dorsiflexion 5 5  Ankle plantarflexion    Ankle inversion    Ankle eversion     (  Blank rows = not tested)  BED MOBILITY:  Not tested  TRANSFERS: Sit to stand: Complete Independence  Assistive device utilized: None     Stand to sit: Complete Independence  Assistive device utilized: None      RAMP:  Not tested  CURB:  Not tested  GAIT: Gait pattern: step through pattern, decreased arm swing- Left, decreased step length- Right, decreased stance time- Left, decreased stride length, decreased hip/knee flexion- Left, lateral hip instability, and wide BOS Distance walked: Various clinic distances  Assistive device utilized: None Level of assistance: Modified independence Comments: Pt walks very guarded, as if she feels unstable. No instability noted other than single instance of L knee buckling which pt able to self-correct.    PATIENT SURVEYS:  ABC scale to be obtained    VITALS  There were no vitals filed for this visit.                                                                                                                              TREATMENT:    Self-Care Discussed return to work and talking to Dr. Carlen Chasten about going back part-time. Pt reports she will not know what she cannot do until she goes back but knows she cannot tolerate a 12 hour shift.    Ther Act   [facility] PT Assessment - 03/20/24 0814       Balance   Balance Assessed Yes      Standardized Balance Assessment   Standardized Balance Assessment Timed Up and Go Test      Timed Up and Go Test   Manual TUG (seconds) 17.59    Cognitive TUG (seconds) 12.35   retro counitng by 8s             NMR  Pt performed floor transfer mod I and performed low squat to tall kneel, x4 reps per side for improved LE coordination, single leg stability and functional BLE strength. Pt very challenged by this, requiring BUE support on mat to stabilize. Pt reported feeling as though her L ankle was "rotating" on her, but no ankle instability noted.  Box squats to 14" box w/15# KB held in  goblet position, x10 reps, for improved strength coming up from low position. Pt performed well w/no instability but did demonstrate tremor in LLE on final 4 reps.  Created "wobble bar" from PVC pipe w/2 5# dumbbells anchored to each end w/green theraband. Pt performed x12 OH presses w/bar while sitting on 14" box for improved BUE strength, stability and core stability. Pt reported not being able to press w/LUE, but noted symmetrical bar path w/each rep. Offered to film pt or place mirror in front of her so she can see her performance, but pt refused. Recommended pt use a mirror when she returns to lifting in order to see her form, as there is a disconnect between what she thinks her LUE is doing and what it actually is doing.  Pt verbalized understanding.      PATIENT EDUCATION: Education details: Continue HEP, ask Dr. Sharl Davies about returning to work part-time  Person educated: Patient Education method: Programmer, multimedia, Facilities manager, and Verbal cues Education comprehension: verbalized understanding, returned demonstration, verbal cues required, and needs further education  HOME EXERCISE PROGRAM: Access Code: [email or web address] URL: [email or web address] Date: 02/13/2024 Prepared by: Burleigh Carp Romone Shaff  Exercises - Half Kneeling Lift  - 1 x daily - 7 x weekly - 1 sets - 10 reps - Down Dog with Bent Knees and Heels on Mat  - 1 x daily - 7 x weekly - 2-3 reps - 15-30 seconds hold - Bird Dog  - 1 x daily - 7 x weekly - 3 sets - 10 reps - Lateral Shuffles  - 1 x daily - 7 x weekly - 3 sets - 10 reps - Braided Sidestepping  - 1 x daily - 7 x weekly - 3 sets - 10 reps - Standing Balance Activity: Plyometric Modified Lower Extremity Jumping Jack  - 1 x daily - 7 x weekly - 3 sets - 10 reps - Standing Forward Step Taps with Counter Support  - 1 x daily - 7 x weekly - 3 sets - 10 reps (at bottom of steps with no UE support, red TB around ankles) - Ankle Inversion with Resistance  - 1 x daily - 7 x  weekly - 3 sets - 10 reps  From CIR Therapist: Also instructed pt in 2 additional exercises but no printed versions available: - tall kneeling to half kneeling - sit to half kneeling and then return to sit. Perform to each side.   Verbally added 02/21/24: -squats at countertop with chair behind you  Verbally added 03/13/24: - eccentric heel taps  - jumping over line fwd/retro/laterally   GOALS: Goals reviewed with patient? Yes  SHORT TERM GOALS: Target date: 02/28/2024  Pt will improve FGA to 28/30 for decreased fall risk  Baseline: 21/30, 25/30 (5/13) Goal status: IN PROGRESS  2.  HiMat to be assessed at STG check and LTG updated  Baseline: 11/54 (5/13) Goal status: MET  3.  Pt will improve gait velocity to at least 3.4 ft/s for improved gait efficiency and return to PLOF Baseline: 3.0 ft/sec (eval), 3.31 ft/sec (5/13) Goal status: IN PROGRESS  4.  ABC scale to be assessed and LTG updated  Baseline: 70.6% (5/13) Goal status: MET  5.  Pt will demonstrate proper lifting technique of 25# weight from floor for proper body mechanics and safety at work Baseline:  Goal status: MET  LONG TERM GOALS: Target date: 03/13/2024  Pt will improve gait velocity to at least 3.5 ft/s for improved gait efficiency and independence Baseline: 3.0 ft/sec (eval), 3.31 ft/sec (5/13), 3.57 ft/sec (5/29) Goal status: MET  2.  Pt will improve her score on the HiMat to >/= 16/54 to demonstrate improved balance and function Baseline: 11/54 (5/13), 21/54 (5/29) Goal status: MET  3.  Pt will improve her ABC scale score to >/= 80% for improved confidence in her balance. Baseline: 70.6% (5/13), 90% (5/29) Goal status: MET  4.  Pt will be able to carry 10# object in BUEs up/down 4 steps for improved coordination and safety in work environment   Baseline: can only carry w/single UE, can carry with BUE mod I (5/29) Goal status: MET  5.  Pt will perform [place] get up w/5# weight for improved shoulder  stability OH, core stability and high level balance  Baseline: CGA to min A  to perform (5/29) Goal status: MET  NEW SHORT TERM GOALS:   Target date: 04/05/2024  Pt will improve her score on the HiMat to >/= 26/54 to demonstrate improved balance and function Baseline: 11/54 (5/13), 21/54 (5/29) Goal status: INITIAL  2.  Pt will be able to perform 5 deep squats and return to standing to demonstrate improved function Baseline: 2 squats before unable to stand up without physical assist Goal status: INITIAL  3.  Manual TUG to be assessed and LTG set Baseline: to be assessed Goal status: MET  4.  Cognitive TUG to be assessed and LTG set Baseline: to be assessed Goal status: MET    NEW LONG TERM GOALS:  Target date: 04/26/2024  Pt will improve gait velocity to at least 3.75 ft/s for improved gait efficiency and independence Baseline: 3.0 ft/sec (eval), 3.31 ft/sec (5/13), 3.57 ft/sec (5/29) Goal status: INITIAL  2.  Pt will improve her score on the HiMat to >/= 31/54 to demonstrate improved balance and function Baseline: 11/54 (5/13), 21/54 (5/29) Goal status: INITIAL  3.  Pt will be able to perform 10 deep squats and return to standing to demonstrate improved function Baseline: 2 squats before unable to stand up without physical assist Goal status: INITIAL  4.  Pt will improve manual TUG to less than or equal to 12 seconds for improved functional mobility and decreased fall risk.  Baseline: 17.59s Goal status: INITIAL  5.  Pt will improve cognitive TUG to less than or equal to 10 seconds for improved functional mobility and decreased fall risk.  Baseline: 12.35s counting backwards by 8 Goal status: INITIAL      ASSESSMENT:  CLINICAL IMPRESSION: Emphasis of skilled PT session on assessing cognitive and dual-motor balance ability via manual and cog TUG, functional BLE strength and core/BUE stability. Pt performed manual TUG in 17.59s, placing her at a high fall risk. Pt  more challenged by manual TUG than cog TUG this date. Pt continues to demonstrate poor insight into physical abilities, stating she is unable to perform tasks that she can, in fact, perform. Attempted to use visual biofeedback to assist w/this, but pt declined as she does not like to see herself. Pt reports she feels ready to return to a routine and work for ~4 hour shifts, encouraged pt to speak to Dr. Sharl Davies and boss about this. Continue POC.   OBJECTIVE IMPAIRMENTS: Abnormal gait, decreased activity tolerance, decreased balance, decreased coordination, decreased endurance, decreased mobility, decreased strength, increased muscle spasms, impaired UE functional use, improper body mechanics, and pain  ACTIVITY LIMITATIONS: carrying, lifting, squatting, stairs, reach over head, hygiene/grooming, and locomotion level  PARTICIPATION LIMITATIONS: meal prep, cleaning, laundry, interpersonal relationship, driving, shopping, community activity, occupation, and yard work  PERSONAL FACTORS: 1 comorbidity: R internal capsule infarct/R basal ganglia stroke are also affecting patient's functional outcome.   REHAB POTENTIAL: Excellent  CLINICAL DECISION MAKING: Evolving/moderate complexity  EVALUATION COMPLEXITY: Moderate  PLAN:  PT FREQUENCY: 2-3x/week (starting w/2x following eval)  PT DURATION: 6 weeks  PLANNED INTERVENTIONS: 16109- PT Re-evaluation, 97750- Physical Performance Testing, 97110-Therapeutic exercises, 97530- Therapeutic activity, [id number]- Neuromuscular re-education, 97535- Self Care, [id number]- Manual therapy, [phone]- Gait training, [phone]- Canalith repositioning, [phone]- Aquatic Therapy, Patient/Family education, Balance training, Stair training, Taping, Dry Needling, Joint mobilization, Spinal mobilization, Vestibular training, and DME instructions  PLAN FOR NEXT SESSION: High level agility, elliptical, L quad eccentric strength, OH tasks. Practice w/plastic needles Sonia Durand has these) in  oranges and pin the needle to targets? Cognitive dual tasking,  needs to work on deep squats and return to standing from squats, agility drills, treadmill training with focus on increasing speed and potentially running? Half kneel, ladder drills, carrying weight with both hands (on rockerboard, stairs), agility ladder. Jumping. Wobble bar   Pt goals: squats (for patient care), time based tasks, not lose my balance (would lose balance with perturbations/reactive balance), multitasking/cog dual tasking (talking while doing tasks)   Mael Delap E Farid Grigorian, PT, DPT  03/20/2024, 8:52 AM

## 2024-03-20 NOTE — Therapy (Signed)
 OUTPATIENT SPEECH LANGUAGE PATHOLOGY TREATMENT NOTE   Patient Name: Faith Jordan MRN: [id number] DOB:06-27-1996, 28 y.o., female Today's Date: 03/20/2024  PCP: Gabriel John NP REFERRING PROVIDER: Genetta Kenning, MD  END OF SESSION:  End of Session - 03/20/24 0758     Visit Number 3    Number of Visits 13    Date for SLP Re-Evaluation 05/22/24    Authorization Type [employer] employee    SLP Start Time 0930    SLP Stop Time  1015    SLP Time Calculation (min) 45 min    Activity Tolerance Patient tolerated treatment well             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   Acute flank pain 06/02/2022   Angio-edema 04/27/2016   Angioedema    Aquagenic angio-edema-urticaria    Asthma    no problems /not used recently   Dysautonomia (HCC)    Dysrhythmia    sinus tachycardia   Fibroid    right breast, adenoma   Headache(784.0)    History of COVID-19 11/21/2020   Hives 12/22/2015   Pneumonia    hx  6th grade   Sensation of fullness in both ears 02/02/2023   Snoring 02/11/2022   Urinary frequency 02/12/2019   Vaginal yeast infection 06/02/2022   Vasculitis (HCC)    Past Surgical History:  Procedure Laterality Date   ADENOIDECTOMY     BREAST LUMPECTOMY Right    BREAST LUMPECTOMY Right    MASS EXCISION Right 10/07/2014   Procedure: EXCISION OF RIGHT BREAST MASS;  Surgeon: Adalberto Hollow, MD;  Location: [facility] OR;  Service: General;  Laterality: Right;   TONSILLECTOMY AND ADENOIDECTOMY     TOOTH EXTRACTION     TRANSESOPHAGEAL ECHOCARDIOGRAM (CATH LAB) N/A 01/10/2024   Procedure: TRANSESOPHAGEAL ECHOCARDIOGRAM;  Surgeon: Hugh Madura, MD;  Location: [facility];  Service: Cardiovascular;  Laterality: N/A;   Patient Active Problem List   Diagnosis Date Noted   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) 02/10/2024   Coping style affecting medical condition 01/19/2024    History of CVA (cerebrovascular accident) 01/12/2024   Stroke of right basal ganglia (HCC) 01/07/2024   Non-restorative sleep 05/26/2022   Sleep paralysis, 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   Vivid dream 05/26/2022   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   Excessive daytime sleepiness 05/26/2022   Sleep disturbance 03/17/2022   Panic attacks 02/11/2022   Vitamin B12 deficiency 07/30/2021   Vitamin D  deficiency 07/30/2021   Family history of hypothyroidism 07/30/2021   GAD (generalized anxiety disorder) 03/18/2020   Fatigue 11/06/2019   Chronic back pain 07/06/2019   Preventative health care 04/27/2018   Migraines 04/25/2018   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   Anemia 01/04/2018   Headache disorder 12/29/2015   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   Sinus tachycardia 12/22/2015   Dysautonomia (HCC) 12/22/2015   Dysautonomia, familial (HCC) 04/22/2015   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   Neurocardiogenic syncope 04/06/2013   Intermittent palpitations 04/06/2013    ONSET DATE: 01/26/2024 (referral date)   REFERRING DIAG: I63.81 (ICD-10-CM) - Other cerebral infarction due to occlusion or stenosis of small artery  THERAPY DIAG:  Cognitive communication deficit  Dysarthria and anarthria  Rationale for Evaluation and Treatment: Rehabilitation  SUBJECTIVE:   SUBJECTIVE STATEMENT: Reported difficulty recalling certain names  Pt accompanied by: self  PERTINENT HISTORY: "Faith Jordan is a 28 y.o.  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  Last known well at 8:00 PM on 01/06/2024.  She initially thought her left-sided weakness were related to her hemiplegic migraine.  However, they usually involve her face only and not other parts of her body.  She took her migraine medications, but her hemiaplasia persisted so she came to the  ED. In the ER, noncontrast head CT showed no acute intracranial abnormality, dense calcification within the globus pallidus bilaterally, nonspecific finding that can be seen in the setting of Fahr's disease.  CT angio head and neck showing no evidence of LVO.  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 Neurology feels stroke is cryptogenic given large size of 1 cm Jordan though its internal capsule infarct, but noted  finding of bilateral basal ganglia calcification raising possibility of mineralizing lenticulostriate vasculopathy. CTA head & neck Normal CTA of the neck. Normal CTA Circle of Willis without significant proximal stenosis, aneurysm, or branch vessel occlusion.as US  TCD Bubble negative for right-to-left shunt, Vas US  LE negative for DVT, Hypercoagulable panel negative so far and ANA negative 2D Echo EF 60-65%.  Left atrial size normal."    PAIN:  Are you having pain? No  FALLS: Has patient fallen in last 6 months?  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  PATIENT REPORTED OUTCOME MEASURES (PROM): See below                                                                                                                            TREATMENT DATE:  03/20/24: Endorsed ongoing memory challenges, including recalling names of coworkers. Targeted use of association and repetition to aid encoding of specific information, in which pt able to recall ~1 minute and ~25 minutes after  training. Targeted metananalysis of necessary skills required for successful return to work, in which pt anticipated difficulty related to gross motor, fine motor, and processing speed. Discussed targeting therapeutic activities to simulate work skills. Recommended increasing duration of communication opportunities to replicate need for extended discourse at work. Identified possible accommodations to maximize work performance and transition back to work. Recommended discussing with MD at next appointment. Pt denied anomia and verbalized awareness of need to pace to optimize articulatory precision.  03/13/24: Used multifactorial memory questionnaire and pt reports to ID memory mistakes to address: Forgot to bring stuff with you  Forgetting people's names (e.g. coworkers last names)   Word finding (e.g.  cooperating)  Forgetting to pass on messages   SLP provided pt education for use of internal and external memory strategies. Discussed rational for repetition, write-it down, reminders app, getting formation in writing. Examples given for uses of each. Pt reports frustration regarding need to use strategies where as before she would not need to. Discussed "rose colored glasses" and re-framing of looking at memory mistakes as a problem to solve instead of a reflection on self. Will continue to discuss re-framing to reduce negative impact of internal focus as barrier to use of strategies. Plan to instruct for anomia strategies next session.   02/28/24: Education provided on evaluation results and SLP's recommendations. Pt verbalizes agreement with POC, all questions answered to satisfaction.  Initiated training re: "clear speech" for improved speech production. Education provided regarding clear speech principles of reduced rate, increased loudness, and over-articulation. Demonstration provided. HEP initiated for use of clear speech during daily reading. HEP resources provided for cognition.   PATIENT  EDUCATION: Education details: see above Person educated: Patient Education method: Explanation, Demonstration, and Handouts Education comprehension: verbalized understanding, returned demonstration, and needs further education   GOALS: Goals reviewed with patient? Yes  SHORT TERM GOALS: Target date: 04/10/2024  Pt will use log to ID functional memory mistakes or cognitive concerns in home environment over 2 week period Baseline: Goal status: IN PROGRESS  2.  Pt will utilize dysarthria strategies and compensations during oral reading 2+ paragraphs with WNL volume and improved articulatory precision with mod-I Baseline:  Goal status: IN PROGRESS  3.  Pt will teach back compensations for the dysarthric speaker with mod-I Baseline:  Goal status: IN PROGRESS   LONG TERM GOALS: Target date: 05/22/2024  Pt will develop and utilize memory support strategies or external cognitive aid to address x2 home based and x2 work based memory concerns  Baseline:  Goal status: IN PROGRESS  2.  Pt will complete dysarthria and cognitive skill HEP daily over 2 week period Baseline:  Goal status: IN PROGRESS  3.  Pt will utilize dysarthria strategies and compensations during 20 minute conversation with WNL volume and improved articulatory precision with mod-I Baseline:  Goal status: IN PROGRESS   ASSESSMENT:  CLINICAL IMPRESSION: Patient is a 28 y.o. F who was seen today for ST tx for stroke. Continued education and instruction of communication and cognitive strategies to optimize daily functioning. Will plan to attempt simulation of word-related cognitive tasks as well to ensure skills intact for complex job duties or ID of potential facilitative accommodations. I recommend skilled ST to address aforementioned deficits to maximize independence and return to baseline.    OBJECTIVE IMPAIRMENTS: include attention, memory, and dysarthria. These impairments are limiting patient from return to work,  household responsibilities, and effectively communicating at home and in community.Factors affecting potential to achieve goals and functional outcome are no overt barriers. Patient will benefit from skilled SLP services to address above impairments and improve overall function.  REHAB POTENTIAL: Good  PLAN:  SLP FREQUENCY: 2x/week  SLP DURATION: 12 weeks  PLANNED INTERVENTIONS: Cueing hierachy, Cognitive reorganization, Internal/external aids, Functional tasks, SLP instruction and feedback, Compensatory strategies, and Patient/family education    Tamar Fairly, CCC-SLP 03/20/2024, 7:58 AM

## 2024-03-21 ENCOUNTER — Encounter: Payer: Self-pay | Admitting: Family Medicine

## 2024-03-21 ENCOUNTER — Ambulatory Visit (INDEPENDENT_AMBULATORY_CARE_PROVIDER_SITE_OTHER): Admitting: Family Medicine

## 2024-03-21 VITALS — BP 106/65 | HR 75

## 2024-03-21 DIAGNOSIS — G43709 Chronic migraine without aura, not intractable, without status migrainosus: Secondary | ICD-10-CM

## 2024-03-21 MED ORDER — ONABOTULINUMTOXINA 200 UNITS IJ SOLR
155.0000 [IU] | Freq: Once | INTRAMUSCULAR | Status: AC
  Administered 2024-03-21: 155 [IU] via INTRAMUSCULAR

## 2024-03-21 NOTE — Progress Notes (Signed)
.  Botox - 200 units x 1 vial Lot:[id number] Expiration: 07/2026 NDC: 1610-9604-54  Bacteriostatic 0.9% Sodium Chloride - 30 mL  UJW:[id number] Expiration: JUN-30-2026 NDC: 8295-6213-08  Dx: G43.709 S/P Witnessed by: Molly Angers, CMA

## 2024-03-21 NOTE — Progress Notes (Signed)
 03/21/24 ALL: Faith Jordan presents for Botox . She continues Emgality  montly and tolerating it well. She is having regular headache days but reports migraines have remained well controlled. She has had one event since stroke 12/2023 where she had left hemiplegic symptoms with slurred speech. No new intracranial abnormalities. Migraine cocktail relieved symptoms. She is scheduled to see Dr Maryalice Smaller with [facility] at the end of June.     Consent Form Botulism Toxin Injection For Chronic Migraine    Reviewed orally with patient, additionally signature is on file:  Botulism toxin has been approved by the Federal drug administration for treatment of chronic migraine. Botulism toxin does not cure chronic migraine and it may not be effective in some patients.  The administration of botulism toxin is accomplished by injecting a small amount of toxin into the muscles of the neck and head. Dosage must be titrated for each individual. Any benefits resulting from botulism toxin tend to wear off after 3 months with a repeat injection required if benefit is to be maintained. Injections are usually done every 3-4 months with maximum effect peak achieved by about 2 or 3 weeks. Botulism toxin is expensive and you should be sure of what costs you will incur resulting from the injection.  The side effects of botulism toxin use for chronic migraine may include:   -Transient, and usually mild, facial weakness with facial injections  -Transient, and usually mild, head or neck weakness with head/neck injections  -Reduction or loss of forehead facial animation due to forehead muscle weakness  -Eyelid drooping  -Dry eye  -Pain at the site of injection or bruising at the site of injection  -Double vision  -Potential unknown long term risks   Contraindications: You should not have Botox  if you are pregnant, nursing, allergic to albumin, have an infection, skin condition, or muscle weakness at the site of the  injection, or have myasthenia gravis, Lambert-Eaton syndrome, or ALS.  It is also possible that as with any injection, there may be an allergic reaction or no effect from the medication. Reduced effectiveness after repeated injections is sometimes seen and rarely infection at the injection site may occur. All care will be taken to prevent these side effects. If therapy is given over a long time, atrophy and wasting in the muscle injected may occur. Occasionally the patient's become refractory to treatment because they develop antibodies to the toxin. In this event, therapy needs to be modified.  I have read the above information and consent to the administration of botulism toxin.    BOTOX  PROCEDURE NOTE FOR MIGRAINE HEADACHE  Contraindications and precautions discussed with patient(above). Aseptic procedure was observed and patient tolerated procedure. Procedure performed by Terrilyn Fick, FNP-C.   The condition has existed for more than 6 months, and pt does not have a diagnosis of ALS, Myasthenia Gravis or Lambert-Eaton Syndrome.  Risks and benefits of injections discussed and pt agrees to proceed with the procedure.  Written consent obtained  These injections are medically necessary. Pt  receives good benefits from these injections. These injections do not cause sedations or hallucinations which the oral therapies may cause.   Description of procedure:  The patient was placed in a sitting position. The standard protocol was used for Botox  as follows, with 5 units of Botox  injected at each site:  -Procerus muscle, midline injection  -Corrugator muscle, bilateral injection  -Frontalis muscle, bilateral injection, with 2 sites each side, medial injection was performed in the upper one third of  the frontalis muscle, in the region vertical from the medial inferior edge of the superior orbital rim. The lateral injection was again in the upper one third of the forehead vertically above the lateral  limbus of the cornea, 1.5 cm lateral to the medial injection site.  -Temporalis muscle injection, 4 sites, bilaterally. The first injection was 3 cm above the tragus of the ear, second injection site was 1.5 cm to 3 cm up from the first injection site in line with the tragus of the ear. The third injection site was 1.5-3 cm forward between the first 2 injection sites. The fourth injection site was 1.5 cm posterior to the second injection site. 5th site laterally in the temporalis  muscleat the level of the outer canthus.  -Occipitalis muscle injection, 3 sites, bilaterally. The first injection was done one half way between the occipital protuberance and the tip of the mastoid process behind the ear. The second injection site was done lateral and superior to the first, 1 fingerbreadth from the first injection. The third injection site was 1 fingerbreadth superiorly and medially from the first injection site.  -Cervical paraspinal muscle injection, 2 sites, bilaterally. The first injection site was 1 cm from the midline of the cervical spine, 3 cm inferior to the lower border of the occipital protuberance. The second injection site was 1.5 cm superiorly and laterally to the first injection site.  -Trapezius muscle injection was performed at 3 sites, bilaterally. The first injection site was in the upper trapezius muscle halfway between the inflection point of the neck, and the acromion. The second injection site was one half way between the acromion and the first injection site. The third injection was done between the first injection site and the inflection point of the neck.   Will return for repeat injection in 3 months.   A total of 200 units of Botox  was prepared, 155 units of Botox  was injected as documented above, any Botox  not injected was wasted. The patient tolerated the procedure well, there were no complications of the above procedure.

## 2024-03-22 ENCOUNTER — Ambulatory Visit: Payer: Self-pay | Admitting: Physical Therapy

## 2024-03-22 DIAGNOSIS — R2689 Other abnormalities of gait and mobility: Secondary | ICD-10-CM | POA: Diagnosis not present

## 2024-03-22 DIAGNOSIS — R41841 Cognitive communication deficit: Secondary | ICD-10-CM | POA: Diagnosis not present

## 2024-03-22 DIAGNOSIS — I69354 Hemiplegia and hemiparesis following cerebral infarction affecting left non-dominant side: Secondary | ICD-10-CM | POA: Diagnosis not present

## 2024-03-22 DIAGNOSIS — I6381 Other cerebral infarction due to occlusion or stenosis of small artery: Secondary | ICD-10-CM | POA: Diagnosis not present

## 2024-03-22 DIAGNOSIS — R278 Other lack of coordination: Secondary | ICD-10-CM | POA: Diagnosis not present

## 2024-03-22 DIAGNOSIS — R29818 Other symptoms and signs involving the nervous system: Secondary | ICD-10-CM | POA: Diagnosis not present

## 2024-03-22 DIAGNOSIS — M5459 Other low back pain: Secondary | ICD-10-CM | POA: Diagnosis not present

## 2024-03-22 DIAGNOSIS — M6281 Muscle weakness (generalized): Secondary | ICD-10-CM | POA: Diagnosis not present

## 2024-03-22 DIAGNOSIS — R2681 Unsteadiness on feet: Secondary | ICD-10-CM | POA: Diagnosis not present

## 2024-03-22 DIAGNOSIS — R29898 Other symptoms and signs involving the musculoskeletal system: Secondary | ICD-10-CM | POA: Diagnosis not present

## 2024-03-22 NOTE — Therapy (Signed)
 OUTPATIENT PHYSICAL THERAPY NEURO TREATMENT    Patient Name: Faith Jordan MRN: [id number] DOB:[birth year], 28 y.o., female Today's Date: 03/22/2024   PCP: Gabriel John, NP REFERRING PROVIDER: Genetta Kenning, MD   END OF SESSION:  PT End of Session - 03/22/24 0804     Visit Number 15    Number of Visits 25   recert   Date for PT Re-Evaluation 05/10/24   recert, to allow for scheduling delays   Authorization Type Garth Kansky    PT Start Time 4098    PT Stop Time 0844    PT Time Calculation (min) 41 min    Equipment Utilized During Treatment Gait belt    Activity Tolerance Patient tolerated treatment well    Behavior During Therapy [facility] for tasks assessed/performed                 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   Acute flank pain 06/02/2022   Angio-edema 04/27/2016   Angioedema    Aquagenic angio-edema-urticaria    Asthma    no problems /not used recently   Dysautonomia (HCC)    Dysrhythmia    sinus tachycardia   Fibroid    right breast, adenoma   Headache(784.0)    History of COVID-19 11/21/2020   Hives 12/22/2015   Pneumonia    hx  6th grade   Sensation of fullness in both ears 02/02/2023   Snoring 02/11/2022   Urinary frequency 02/12/2019   Vaginal yeast infection 06/02/2022   Vasculitis (HCC)    Past Surgical History:  Procedure Laterality Date   ADENOIDECTOMY     BREAST LUMPECTOMY Right    BREAST LUMPECTOMY Right    MASS EXCISION Right 10/07/2014   Procedure: EXCISION OF RIGHT BREAST MASS;  Surgeon: Adalberto Hollow, MD;  Location: [facility] OR;  Service: General;  Laterality: Right;   TONSILLECTOMY AND ADENOIDECTOMY     TOOTH EXTRACTION     TRANSESOPHAGEAL ECHOCARDIOGRAM (CATH LAB) N/A 01/10/2024   Procedure: TRANSESOPHAGEAL ECHOCARDIOGRAM;  Surgeon: Hugh Madura, MD;  Location: [facility] INVASIVE CV LAB;  Service: Cardiovascular;  Laterality: N/A;   Patient Active Problem List    Diagnosis Date Noted   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) 02/10/2024   Coping style affecting medical condition 01/19/2024   History of CVA (cerebrovascular accident) 01/12/2024   Stroke of right basal ganglia (HCC) 01/07/2024   Non-restorative sleep 05/26/2022   Sleep paralysis, 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   Vivid dream 05/26/2022   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   Excessive daytime sleepiness 05/26/2022   Sleep disturbance 03/17/2022   Panic attacks 02/11/2022   Vitamin B12 deficiency 07/30/2021   Vitamin D  deficiency 07/30/2021   Family history of hypothyroidism 07/30/2021   GAD (generalized anxiety disorder) 03/18/2020   Fatigue 11/06/2019   Chronic back pain 07/06/2019   Preventative health care 04/27/2018   Migraines 04/25/2018   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   Anemia 01/04/2018   Headache disorder 12/29/2015   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   Sinus tachycardia 12/22/2015   Dysautonomia (HCC) 12/22/2015   Dysautonomia, familial (HCC) 04/22/2015   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   Neurocardiogenic syncope 04/06/2013   Intermittent palpitations 04/06/2013    ONSET DATE: 01/26/2024 (referral)   REFERRING DIAG:  I63.81 (ICD-10-CM) - Other cerebral infarction due to occlusion or stenosis of small artery    THERAPY DIAG:  Muscle weakness (generalized)  Other abnormalities of gait and mobility  Other lack of coordination  Rationale for Evaluation and Treatment: Rehabilitation  SUBJECTIVE:                                                                                                                                                                                             SUBJECTIVE STATEMENT: Pt reports she received botox  for migraines yesterday, was sore last night but feels better today. Feels like her sciatica on the R side is trying to flare up. Got PT for this about 5 years ago. No  falls or acute changes   Pt accompanied by: self  PERTINENT HISTORY: familiar hemiplegic migraine, angioedema 2017,dysautonomia; infarct right internal capsule/ R basal ganglia stroke  PAIN:  Are you having pain? No    PRECAUTIONS: Fall  RED FLAGS: None   WEIGHT BEARING RESTRICTIONS: No  FALLS: Has patient fallen in last 6 months? No  LIVING ENVIRONMENT: Lives with: lives with their family Lives in: House/apartment Stairs: Yes: External: 4 steps; bilateral but cannot reach both Has following equipment at home: shower chair  PLOF: Independent  PATIENT GOALS: "Being normal again. Being able to go back to work and be independent"   OBJECTIVE:  Note: Objective measures were completed at Evaluation unless otherwise noted.  DIAGNOSTIC FINDINGS: MRI of brain on 01/07/24  IMPRESSION: 1. 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 2. Susceptibility related calcifications in the globus pallidus bilaterally. This likely reflects the sequela of chronic microvascular ischemia.  COGNITION: Overall cognitive status: Within functional limits for tasks assessed   SENSATION: Pt denies numbness/tingling in L hemibody   MUSCLE TONE: Non-fatiguing clonus of LLE  POSTURE: No Significant postural limitations  LOWER EXTREMITY ROM:     Active  Right Eval Left Eval  Hip flexion    Hip extension    Hip abduction    Hip adduction    Hip internal rotation    Hip external rotation    Knee flexion    Knee extension    Ankle dorsiflexion    Ankle plantarflexion    Ankle inversion    Ankle eversion     (Blank rows = not tested)  LOWER EXTREMITY MMT:  Tested in seated position   MMT Right Eval Left Eval  Hip flexion 5 4+  Hip extension    Hip abduction 5 5  Hip adduction 5 5  Hip internal rotation    Hip external rotation    Knee flexion 5 5  Knee extension 5 4+  Ankle dorsiflexion 5 5  Ankle plantarflexion    Ankle  inversion    Ankle eversion    (Blank rows = not tested)  BED MOBILITY:  Not tested  TRANSFERS: Sit to stand: Complete Independence  Assistive device utilized: None     Stand to sit: Complete Independence  Assistive device utilized: None      RAMP:  Not tested  CURB:  Not tested  GAIT: Gait pattern: step through pattern, decreased arm swing- Left, decreased step length- Right, decreased stance time- Left, decreased stride length, decreased hip/knee flexion- Left, lateral hip instability, and wide BOS Distance walked: Various clinic distances  Assistive device utilized: None Level of assistance: Modified independence Comments: Pt walks very guarded, as if she feels unstable. No instability noted other than single instance of L knee buckling which pt able to self-correct.    PATIENT SURVEYS:  ABC scale to be obtained    VITALS  There were no vitals filed for this visit.                                                                                                                              TREATMENT:    NMR  The following treadmill training was completed for aerobic/neural priming, endurance, and gait speed.  - Warmup: 9:00 up to 2.0 mph and 3% incline. Pt able to walk without UE support and maintain conversation w/therapist  - HIIT: 5:00 45sec ON/ 15 seconds OFF alternating 2.3 / 3.3 mph at 3% incline. On round 4, pt began to drag LLE and noted minor circumduction due to fatigue.  -Cool down: 2:00 at 2.0 mph and 0% incline. Full step clearance noted w/LLE   Slam ball throws over the shoulder w/10# ball, x20 reps, for improved lifting technique, stability w/deep squat and stability w/high velocity/amplitude movement. Pt performed well at mod I level, noted tremor of LLE on reps 16-20.  On bosu (black side), attempted to stand at rebounder, but pt unable to stabilize on bosu due to anxiety w/being out in open gym. Regressed to standing on Bosu in // bars and performing ball  tosses w/second therapist w/CGA. Pt able to stabilize well on Bosu when she had bars to either side, but did not require BUE support to stabilize. Pt most challenged when reaching to L to catch ball, but no overt LOB noted.       PATIENT EDUCATION: Education details: Continue HEP, ask Dr. Sharl Davies about returning to work part-time (4 hours for 2 days per week max to start out)  Person educated: Patient Education method: Programmer, multimedia, Facilities manager, and Verbal cues Education comprehension: verbalized understanding, returned demonstration, verbal cues required, and needs further education  HOME EXERCISE PROGRAM: Access Code: [email or web address] URL: [email or web address] Date: 02/13/2024 Prepared by: Burleigh Carp Brix Brearley  Exercises - Half Kneeling Lift  - 1 x daily - 7 x weekly - 1 sets - 10 reps - Down Dog with Bent Knees and Heels on Mat  - 1 x daily - 7 x weekly - 2-3 reps -  15-30 seconds hold - Bird Dog  - 1 x daily - 7 x weekly - 3 sets - 10 reps - Lateral Shuffles  - 1 x daily - 7 x weekly - 3 sets - 10 reps - Braided Sidestepping  - 1 x daily - 7 x weekly - 3 sets - 10 reps - Standing Balance Activity: Plyometric Modified Lower Extremity Jumping Jack  - 1 x daily - 7 x weekly - 3 sets - 10 reps - Standing Forward Step Taps with Counter Support  - 1 x daily - 7 x weekly - 3 sets - 10 reps (at bottom of steps with no UE support, red TB around ankles) - Ankle Inversion with Resistance  - 1 x daily - 7 x weekly - 3 sets - 10 reps  From CIR Therapist: Also instructed pt in 2 additional exercises but no printed versions available: - tall kneeling to half kneeling - sit to half kneeling and then return to sit. Perform to each side.   Verbally added 02/21/24: -squats at countertop with chair behind you  Verbally added 03/13/24: - eccentric heel taps  - jumping over line fwd/retro/laterally   GOALS: Goals reviewed with patient? Yes  SHORT TERM GOALS: Target date: 02/28/2024  Pt will  improve FGA to 28/30 for decreased fall risk  Baseline: 21/30, 25/30 (5/13) Goal status: IN PROGRESS  2.  HiMat to be assessed at STG check and LTG updated  Baseline: 11/54 (5/13) Goal status: MET  3.  Pt will improve gait velocity to at least 3.4 ft/s for improved gait efficiency and return to PLOF Baseline: 3.0 ft/sec (eval), 3.31 ft/sec (5/13) Goal status: IN PROGRESS  4.  ABC scale to be assessed and LTG updated  Baseline: 70.6% (5/13) Goal status: MET  5.  Pt will demonstrate proper lifting technique of 25# weight from floor for proper body mechanics and safety at work Baseline:  Goal status: MET  LONG TERM GOALS: Target date: 03/13/2024  Pt will improve gait velocity to at least 3.5 ft/s for improved gait efficiency and independence Baseline: 3.0 ft/sec (eval), 3.31 ft/sec (5/13), 3.57 ft/sec (5/29) Goal status: MET  2.  Pt will improve her score on the HiMat to >/= 16/54 to demonstrate improved balance and function Baseline: 11/54 (5/13), 21/54 (5/29) Goal status: MET  3.  Pt will improve her ABC scale score to >/= 80% for improved confidence in her balance. Baseline: 70.6% (5/13), 90% (5/29) Goal status: MET  4.  Pt will be able to carry 10# object in BUEs up/down 4 steps for improved coordination and safety in work environment   Baseline: can only carry w/single UE, can carry with BUE mod I (5/29) Goal status: MET  5.  Pt will perform [place] get up w/5# weight for improved shoulder stability OH, core stability and high level balance  Baseline: CGA to min A to perform (5/29) Goal status: MET  NEW SHORT TERM GOALS:   Target date: 04/05/2024  Pt will improve her score on the HiMat to >/= 26/54 to demonstrate improved balance and function Baseline: 11/54 (5/13), 21/54 (5/29) Goal status: INITIAL  2.  Pt will be able to perform 5 deep squats and return to standing to demonstrate improved function Baseline: 2 squats before unable to stand up without physical  assist Goal status: INITIAL  3.  Manual TUG to be assessed and LTG set Baseline: to be assessed Goal status: MET  4.  Cognitive TUG to be assessed and LTG set  Baseline: to be assessed Goal status: MET    NEW LONG TERM GOALS:  Target date: 04/26/2024  Pt will improve gait velocity to at least 3.75 ft/s for improved gait efficiency and independence Baseline: 3.0 ft/sec (eval), 3.31 ft/sec (5/13), 3.57 ft/sec (5/29) Goal status: INITIAL  2.  Pt will improve her score on the HiMat to >/= 31/54 to demonstrate improved balance and function Baseline: 11/54 (5/13), 21/54 (5/29) Goal status: INITIAL  3.  Pt will be able to perform 10 deep squats and return to standing to demonstrate improved function Baseline: 2 squats before unable to stand up without physical assist Goal status: INITIAL  4.  Pt will improve manual TUG to less than or equal to 12 seconds for improved functional mobility and decreased fall risk.  Baseline: 17.59s Goal status: INITIAL  5.  Pt will improve cognitive TUG to less than or equal to 10 seconds for improved functional mobility and decreased fall risk.  Baseline: 12.35s counting backwards by 8 Goal status: INITIAL      ASSESSMENT:  CLINICAL IMPRESSION: Emphasis of skilled PT session on gait training on treadmill for improved speed, stability w/deep squat and high amplitude movement and anticipatory balance strategies. Pt able to increase her speed on TM without tone in LLE being limiting factor until >12 minutes of walking, which is greatly improved from previous sessions. Pt had no difficulty lifting 10#slam ball from ground in deep squat position repeatedly as well. Pt most limited by anxiety and fear-avoidance behavior, improving performance on difficult tasks if placed in more secure location (in // bars vs open gym). Pt continues to report wanting to return to work, so recommended she return for 4 hour shifts 2 days per week and then slowly build up her  hours. Pt fluctuates between high anxiety returning to work and then wanting to immediately return to 12 hour shifts, requiring continued education regarding importance of slowly building up tolerance and ensuring she can handle work duties. Pt to speak to Dr. Sharl Davies regarding returning to 4 hour shifts soon and recommended she speak with her boss as well. Continue POC.   OBJECTIVE IMPAIRMENTS: Abnormal gait, decreased activity tolerance, decreased balance, decreased coordination, decreased endurance, decreased mobility, decreased strength, increased muscle spasms, impaired UE functional use, improper body mechanics, and pain  ACTIVITY LIMITATIONS: carrying, lifting, squatting, stairs, reach over head, hygiene/grooming, and locomotion level  PARTICIPATION LIMITATIONS: meal prep, cleaning, laundry, interpersonal relationship, driving, shopping, community activity, occupation, and yard work  PERSONAL FACTORS: 1 comorbidity: R internal capsule infarct/R basal ganglia stroke are also affecting patient's functional outcome.   REHAB POTENTIAL: Excellent  CLINICAL DECISION MAKING: Evolving/moderate complexity  EVALUATION COMPLEXITY: Moderate  PLAN:  PT FREQUENCY: 2-3x/week (starting w/2x following eval)  PT DURATION: 6 weeks  PLANNED INTERVENTIONS: 69629- PT Re-evaluation, 97750- Physical Performance Testing, 97110-Therapeutic exercises, 97530- Therapeutic activity, [id number]- Neuromuscular re-education, 97535- Self Care, 52841- Manual therapy, [phone]- Gait training, [phone]- Canalith repositioning, [phone]- Aquatic Therapy, Patient/Family education, Balance training, Stair training, Taping, Dry Needling, Joint mobilization, Spinal mobilization, Vestibular training, and DME instructions  PLAN FOR NEXT SESSION: High level agility, elliptical, L quad eccentric strength, OH tasks. Practice w/plastic needles Sonia Durand has these) in oranges and pin the needle to targets? Cognitive dual tasking, needs to work on  deep squats and return to standing from squats, agility drills, treadmill training with focus on increasing speed and potentially running? Half kneel, ladder drills, carrying weight with both hands (on rockerboard, stairs), agility ladder. Jumping. Wobble bar  Pt goals: squats (for patient care), time based tasks, not lose my balance (would lose balance with perturbations/reactive balance), multitasking/cog dual tasking (talking while doing tasks)   Lorree Millar E Samael Blades, PT, DPT 03/22/2024, 8:45 AM

## 2024-03-23 ENCOUNTER — Encounter: Payer: Self-pay | Admitting: Physical Medicine & Rehabilitation

## 2024-03-23 ENCOUNTER — Encounter: Attending: Physical Medicine & Rehabilitation | Admitting: Physical Medicine & Rehabilitation

## 2024-03-23 VITALS — BP 101/69 | HR 78 | Ht 67.0 in | Wt 157.0 lb

## 2024-03-23 DIAGNOSIS — G8114 Spastic hemiplegia affecting left nondominant side: Secondary | ICD-10-CM | POA: Diagnosis not present

## 2024-03-23 DIAGNOSIS — I639 Cerebral infarction, unspecified: Secondary | ICD-10-CM | POA: Diagnosis not present

## 2024-03-23 NOTE — Progress Notes (Signed)
 Subjective:    Patient ID: Faith Jordan, female    DOB: 1996/07/29, 28 y.o.   MRN: [id number] Faith Jordan was admitted to rehab 01/12/2024 for inpatient therapies to consist of PT, ST and OT at least three hours five days a week. Past admission physiatrist, therapy team and rehab RN have worked together to provide customized collaborative inpatient rehab. 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 She continued to progress nicely with therapies followed by neurology services. Maintained on Lovenox  for DVT prophylaxis. Plavix  ongoing for CVA prophylaxis no aspirin due to NSAID allergy. Mood stabilization with the use of Prozac  as well as melatonin. She was using Ativan  only as needed for for anxiety. In regards to patient's history of hemiplegic migraine she was followed by neurology services Dr.Ahern currently maintained on Topamax . She had been on Nurtec as well as Emgality  prior to admission. Lipitor  ongoing for hyperlipidemia. History of TMJ dysfunction gets Botox  for this. Mild hypokalemia maintained on low-dose potassium supplement with lattest potassium 3.6  HPI  Patient returns today she is still receiving PT OT and speech.  She is scheduled through the beginning of July.  The patient would like to go back to work on a part-time basis maybe 4 hours twice a week.  She still taking naps during the day.  She has not tried any work simulation activities other than performing cognitive tasks while walking. She has had no falls. She feels like her left thumb is still not moving normally she still experiences weakness with flexion of the left elbow.  Pain Inventory Average Pain 0 Pain Right Now 0 My pain is no pain  In the last 24 hours, has pain interfered with the following? General activity 0 Relation with others 0 Enjoyment of life 0 What TIME of day is your pain at its worst?  varies Sleep (in general) Fair  Pain is worse with: no pain Pain improves with: no pain Relief from Meds: no pain  Family History  Problem Relation Age of Onset   Depression Mother    Migraines Mother    Narcolepsy Mother    Hypothyroidism Mother    Myasthenia gravis Maternal Grandfather    Breast cancer Maternal Grandmother 66   Asthma Other    Cancer Other    Epilepsy Other    Allergic rhinitis Neg Hx    Angioedema Neg Hx    Atopy Neg Hx    Eczema Neg Hx    Immunodeficiency Neg Hx    Urticaria Neg Hx    Social History   Socioeconomic History   Marital status: Significant Other    Spouse name: Not on file   Number of children: Not on file   Years of education: Not on file   Highest education level: Bachelor's degree (e.g., BA, AB, BS)  Occupational History   Not on file  Tobacco Use   Smoking status: Never   Smokeless tobacco: Never  Vaping Use   Vaping status: Never Used  Substance and Sexual Activity   Alcohol use: No   Drug use: No   Sexual activity: Yes  Other Topics Concern   Not on file  Social History Narrative   Lives with fiance   R handed   Caffeine : 1 drink a day   Social Drivers of Corporate investment banker Strain: Not on file  Food Insecurity: No Food Insecurity (01/09/2024)   Hunger Vital Sign  Worried About Programme researcher, broadcasting/film/video in the Last Year: Never true    Ran Out of Food in the Last Year: Never true  Transportation Needs: No Transportation Needs (01/09/2024)   PRAPARE - Administrator, Civil Service (Medical): No    Lack of Transportation (Non-Medical): No  Physical Activity: Not on file  Stress: Not on file  Social Connections: Not on file   Past Surgical History:  Procedure Laterality Date   ADENOIDECTOMY     BREAST LUMPECTOMY Right    BREAST LUMPECTOMY Right    MASS EXCISION Right 10/07/2014   Procedure: EXCISION OF RIGHT BREAST MASS;  Surgeon: Adalberto Hollow, MD;  Location: [facility];  Service: General;   Laterality: Right;   TONSILLECTOMY AND ADENOIDECTOMY     TOOTH EXTRACTION     TRANSESOPHAGEAL ECHOCARDIOGRAM (CATH LAB) N/A 01/10/2024   Procedure: TRANSESOPHAGEAL ECHOCARDIOGRAM;  Surgeon: Hugh Madura, MD;  Location: [facility] INVASIVE CV LAB;  Service: Cardiovascular;  Laterality: N/A;   Past Surgical History:  Procedure Laterality Date   ADENOIDECTOMY     BREAST LUMPECTOMY Right    BREAST LUMPECTOMY Right    MASS EXCISION Right 10/07/2014   Procedure: EXCISION OF RIGHT BREAST MASS;  Surgeon: Adalberto Hollow, MD;  Location: [facility] OR;  Service: General;  Laterality: Right;   TONSILLECTOMY AND ADENOIDECTOMY     TOOTH EXTRACTION     TRANSESOPHAGEAL ECHOCARDIOGRAM (CATH LAB) N/A 01/10/2024   Procedure: TRANSESOPHAGEAL ECHOCARDIOGRAM;  Surgeon: Hugh Madura, MD;  Location: [facility] INVASIVE CV LAB;  Service: Cardiovascular;  Laterality: N/A;   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   Acute flank pain 06/02/2022   Angio-edema 04/27/2016   Angioedema    Aquagenic angio-edema-urticaria    Asthma    no problems /not used recently   Dysautonomia [facility])    Dysrhythmia    sinus tachycardia   Fibroid    right breast, adenoma   Headache(784.0)    History of COVID-19 11/21/2020   Hives 12/22/2015   Pneumonia    hx  6th grade   Sensation of fullness in both ears 02/02/2023   Snoring 02/11/2022   Urinary frequency 02/12/2019   Vaginal yeast infection 06/02/2022   Vasculitis (HCC)    BP 101/69   Pulse 78   Ht 5\' 7"  (1.702 m)   Wt 157 lb (71.2 kg)   SpO2 97%   BMI 24.59 kg/m   Opioid Risk Score:   Fall Risk Score:  `1  Depression screen [facility] 2/9     02/10/2024    1:44 PM 02/08/2024    8:36 AM 11/29/2023    8:08 AM 06/10/2022    2:28 PM 06/02/2022    8:27 AM 07/30/2021   10:19 AM 02/03/2021    9:41 AM  Depression screen PHQ 2/9  Decreased Interest 2 1 0  0 0 0  Down, Depressed, Hopeless 2 2 0  0 0 0  PHQ - 2 Score 4 3 0  0 0 0  Altered  sleeping 2 0 0  0 0 0  Tired, decreased energy 2 2 0  3 0 1  Change in appetite 2 0 0  0 0 0  Feeling bad or failure about yourself  2 0 0  0 0 0  Trouble concentrating 0 0 0  0 0 0  Moving slowly or fidgety/restless 0 2 0  0 0 0  Suicidal thoughts 0 0 0  0 0 0  PHQ-9 Score 12 7 0  3 0 1  Difficult doing work/chores Very difficult Not difficult at all Not difficult at all  Not difficult at all Not difficult at all Somewhat difficult     Information is confidential and restricted. Go to Review Flowsheets to unlock data.     Review of Systems  Neurological:  Positive for weakness.  All other systems reviewed and are negative.     Objective:   Physical Exam  Motor strength is 4/5 left deltoid, bicep, tricep, grip Thumb Flexion extension is 3/5 She is able to oppose finger to thumb Left lower extremity strength is 4/5 flexion to perform toe walk heel walk but has difficulty with tandem gait. Speech without dysarthria or aphasia Mood and affect are appropriate     Assessment & Plan:   1.  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  At this point most likely cause is hemiplegic migraine plus contraceptive agents.  Following up with Dr. Narciso Backers from neurology at [facility]. In terms of return to work I do think she may do so in the future but at this point do not have a definite date.  Would recommend additional work simulation activities as well as building her endurance so she no longer needs to take naps during the day.  She still has some thumb dexterity issues as well as strength issues.  Also some balance problems. I will see the patient back in 1 month to reassess. Discussed work activity with patient as well as her mother who is with her today.

## 2024-03-27 ENCOUNTER — Encounter: Payer: Self-pay | Admitting: Physical Medicine & Rehabilitation

## 2024-03-27 ENCOUNTER — Ambulatory Visit (INDEPENDENT_AMBULATORY_CARE_PROVIDER_SITE_OTHER): Admitting: Psychology

## 2024-03-27 ENCOUNTER — Ambulatory Visit: Admitting: Physical Therapy

## 2024-03-27 ENCOUNTER — Ambulatory Visit: Admitting: Occupational Therapy

## 2024-03-27 DIAGNOSIS — R2681 Unsteadiness on feet: Secondary | ICD-10-CM | POA: Diagnosis not present

## 2024-03-27 DIAGNOSIS — R278 Other lack of coordination: Secondary | ICD-10-CM

## 2024-03-27 DIAGNOSIS — R41841 Cognitive communication deficit: Secondary | ICD-10-CM | POA: Diagnosis not present

## 2024-03-27 DIAGNOSIS — I69354 Hemiplegia and hemiparesis following cerebral infarction affecting left non-dominant side: Secondary | ICD-10-CM

## 2024-03-27 DIAGNOSIS — R29898 Other symptoms and signs involving the musculoskeletal system: Secondary | ICD-10-CM | POA: Diagnosis not present

## 2024-03-27 DIAGNOSIS — R29818 Other symptoms and signs involving the nervous system: Secondary | ICD-10-CM | POA: Diagnosis not present

## 2024-03-27 DIAGNOSIS — F331 Major depressive disorder, recurrent, moderate: Secondary | ICD-10-CM | POA: Diagnosis not present

## 2024-03-27 DIAGNOSIS — M6281 Muscle weakness (generalized): Secondary | ICD-10-CM

## 2024-03-27 DIAGNOSIS — I6381 Other cerebral infarction due to occlusion or stenosis of small artery: Secondary | ICD-10-CM

## 2024-03-27 DIAGNOSIS — F411 Generalized anxiety disorder: Secondary | ICD-10-CM

## 2024-03-27 DIAGNOSIS — M5459 Other low back pain: Secondary | ICD-10-CM | POA: Diagnosis not present

## 2024-03-27 DIAGNOSIS — R2689 Other abnormalities of gait and mobility: Secondary | ICD-10-CM | POA: Diagnosis not present

## 2024-03-27 NOTE — Therapy (Unsigned)
 OUTPATIENT OCCUPATIONAL THERAPY NEURO TREATMENT  Patient Name: Faith Jordan MRN: [id number] DOB:August 29, 1996, 28 y.o., female Today's Date: 03/27/2024  PCP: Gabriel John, NP  REFERRING PROVIDER: Genetta Kenning, MD   END OF SESSION:  OT End of Session - 03/27/24 1448     Visit Number 13    Number of Visits 13   including eval   Date for OT Re-Evaluation 04/26/24    Authorization Type [employer] ([place]) 2025    OT Start Time 1448    OT Stop Time 1529    OT Time Calculation (min) 41 min    Equipment Utilized During Treatment --    Activity Tolerance Patient tolerated treatment well    Behavior During Therapy [facility] for tasks assessed/performed              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   Acute flank pain 06/02/2022   Angio-edema 04/27/2016   Angioedema    Aquagenic angio-edema-urticaria    Asthma    no problems /not used recently   Dysautonomia (HCC)    Dysrhythmia    sinus tachycardia   Fibroid    right breast, adenoma   Headache(784.0)    History of COVID-19 11/21/2020   Hives 12/22/2015   Pneumonia    hx  6th grade   Sensation of fullness in both ears 02/02/2023   Snoring 02/11/2022   Urinary frequency 02/12/2019   Vaginal yeast infection 06/02/2022   Vasculitis (HCC)    Past Surgical History:  Procedure Laterality Date   ADENOIDECTOMY     BREAST LUMPECTOMY Right    BREAST LUMPECTOMY Right    MASS EXCISION Right 10/07/2014   Procedure: EXCISION OF RIGHT BREAST MASS;  Surgeon: Adalberto Hollow, MD;  Location[facility] OR;  Service: General;  Laterality: Right;   TONSILLECTOMY AND ADENOIDECTOMY     TOOTH EXTRACTION     TRANSESOPHAGEAL ECHOCARDIOGRAM (CATH LAB) N/A 01/10/2024   Procedure: TRANSESOPHAGEAL ECHOCARDIOGRAM;  Surgeon: Hugh Madura, MD;  Location: [facility] INVASIVE CV LAB;  Service: Cardiovascular;  Laterality: N/A;   Patient Active Problem List   Diagnosis Date Noted   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) 02/10/2024   Coping style affecting medical condition 01/19/2024   History of CVA (cerebrovascular accident) 01/12/2024   Stroke of right basal ganglia (HCC) 01/07/2024   Non-restorative sleep 05/26/2022   Sleep paralysis, 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   Vivid dream 05/26/2022   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   Excessive daytime sleepiness 05/26/2022   Sleep disturbance 03/17/2022   Panic attacks 02/11/2022   Vitamin B12 deficiency 07/30/2021   Vitamin D  deficiency 07/30/2021   Family history of hypothyroidism 07/30/2021   GAD (generalized anxiety disorder) 03/18/2020   Fatigue 11/06/2019   Chronic back pain 07/06/2019   Preventative health care 04/27/2018   Migraines 04/25/2018   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   Anemia 01/04/2018   Headache disorder 12/29/2015   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   Sinus tachycardia 12/22/2015   Dysautonomia (HCC) 12/22/2015   Dysautonomia, familial (HCC) 04/22/2015   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   Neurocardiogenic syncope 04/06/2013   Intermittent palpitations 04/06/2013    ONSET DATE: 01/26/2024 (referral date)  REFERRING DIAG: I63.81 (ICD-10-CM) - Other cerebral infarction due to occlusion or stenosis of small artery   THERAPY DIAG:  Muscle weakness (generalized)  Other lack of coordination  Other symptoms and signs involving the nervous  system  Other symptoms and signs involving the musculoskeletal system  Hemiplegia and hemiparesis following cerebral infarction affecting left non-dominant side (HCC)  Stroke of right basal ganglia (HCC)  Cerebrovascular accident (CVA) of right basal ganglia (HCC)  Rationale for Evaluation and Treatment: Rehabilitation  SUBJECTIVE:   SUBJECTIVE STATEMENT: Pronunciation: [place]  Pt reports things are going fine, just still having trouble with L hand sometimes.  She has difficulty  with turning the radio station using the controls on her steering wheel as this requires the use of her L thumb for button depression.    Pt accompanied by: self   PERTINENT HISTORY: familiar hemiplegic migraine, angioedema 2017,dysautonomia; infarct right internal capsule/ R basal ganglia stroke   MRI of brain on 01/07/24   IMPRESSION: 1. 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 2. Susceptibility related calcifications in the globus pallidus bilaterally. This likely reflects the sequela of chronic microvascular ischemia.  PRECAUTIONS: Fall, Per 02/10/24 MD Office Visit: Pt may gradually begin to return to driving.   WEIGHT BEARING RESTRICTIONS: No  PAIN:  Are you having pain? No  FALLS: Has patient fallen in last 6 months? Yes when CVA event occurred.  03/05/24 - Pt reported recent fall after trying to get clothes out of dryer. Pt denied injuries and able to ind get up after fall.   LIVING ENVIRONMENT: Lives with: lives with their family Lives in: House/apartment Stairs: Yes: External: 4 steps; bilateral but cannot reach both Has following equipment at home: shower chair  PLOF: Independent, typically living ind, worked as Engineer, civil (consulting)  PATIENT GOALS: I want to get my hand and arm back and be able to go to work.  OBJECTIVE:  Note: Objective measures were completed at Evaluation unless otherwise noted.  HAND DOMINANCE: Right  ADLs: Overall ADLs:  Transfers/ambulation related to ADLs: Eating: ind Grooming: difficulty with completing hair styling e.g. putting hair up in ponytail, difficulty with curling hair. Uses RUE to complete toothbrushing and brushing hair.  Difficulty with clipping fingernails. UB Dressing: ind, extra time. Buttons and zippers difficult.  LB Dressing: Ind, difficulty with pulling up pants and buttoning buttons on jeans, extra time for tying shoes Toileting: ind  Bathing: difficulty washing hair d/t unable to  apply pressure while moving LUE, typically uses RUE to ensure thoroughness, shower bench is available PRN Tub Shower transfers: walk-in shower, pt reported SOB after showering or when walking a lot, sometimes requires rest breaks Equipment: Shower seat with back and Walk in shower  IADLs: Shopping: assistance from parents but pt participates, such as trying to reach for items on shelf and pushing cart Light housekeeping: pt completes dishwashing, has not attempted laundry Meal Prep: pt reported cooking but task is tiring Community mobility: has attempted to drive 1x with parent  Handwriting: No change per pt.   Work activities: Pt expressed concerns with FM coordination and concerns about return to work considerations, such as coordination required for drawing up syringes from a vial for medication administration, completing IVs, typing, navigating phone.   MOBILITY STATUS: Independent  POSTURE COMMENTS:  Ind sitting balance, sometimes forward shoulder posture  ACTIVITY TOLERANCE: Activity tolerance: Pt reported I'm doing more than I was doing in the hospital, but I definitely get tired easier and take more breaks.  FUNCTIONAL OUTCOME MEASURES: PSF: 3.0  03/15/24 - PSFS: 7.0    UPPER EXTREMITY ROM:    Active ROM Right eval Left eval  Shoulder flexion [facility]  Shoulder abduction  [facility]  Shoulder adduction    Shoulder extension    Shoulder internal rotation    Shoulder external rotation    Elbow flexion  WFL - slightly delayed movements  Elbow extension    Wrist flexion    Wrist extension    Wrist ulnar deviation    Wrist radial deviation    Wrist pronation    Wrist supination    (Blank rows = not tested)  OT palpated pt's L shoulder and noted tightness of muscles at L shoulder.   UPPER EXTREMITY MMT:     MMT Right eval Left eval  Shoulder flexion 5 3+   HAND FUNCTION: Grip strength: Right: 48.9, 53.5, 53.3 (51.9 lbs average) lbs; Left: 24.2, 22.2, 22.7  (23.1 lbs average) lbs  COORDINATION: 9 Hole Peg test: Right: 20 sec; Left: 40 sec  Pt reported practicing picking up pills, sorting change, and playing connect-4 at home.  SENSATION: Pt reported tingling in L thumb which has since resolves. Pt questioned if symptoms might have been from resting hand splint at night. Pt reported no other symptoms of numbness/tingling.  EDEMA: None noted  MUSCLE TONE: RUE: Within functional limits and LUE: slight ataxia  COGNITION: Overall cognitive status: Within functional limits for tasks assessed  Pt reported some difficulty with word-finding.  Pt reported sometimes asking same question again a few hours apart. Pt's mother reported not noticing this recently.  VISION: Subjective report: Pt reported initial changes to vision (unable to read, blurry vision close-up and far away) though okay now.  Baseline vision: No visual deficits Visual history: none noted  VISION ASSESSMENT: Pt accurately clock on wall and reported reading books on Kindle. Pt reported still using large print on phone since hospital.   Pt tracked easily to all 4 quadrants. Peripheral vision - WFL B sides, ?slightly decreased on L side though functional regardless  Patient has difficulty with following activities due to following visual impairments: None noted  PERCEPTION: Not tested  PRAXIS: Not tested  OBSERVATIONS: Pt ambulated ind. Pt soft-spoken and appeared tired during session. Pt was pleasant and appeared motivated to complete LUE functional tasks at home based on pt report.                                                                                                                             TODAY'S TREATMENT:    - Self-care/home management completed for duration as noted below including: OT completed repeat education for Kinesio tape to L thumb using mechanical technique with 40-60% stretch. Multiple layers applied dorsally to promote extension and  abduction. Piece placed at palmar base of thumb and wrapped around dorsally to support thumb at MCP to prevent hyperextension. Pt taking photos and videos to help with carryover for her to apply or for mother to apply. Pt able to return demonstrate application to R thumb requiring min cues for completion.    PATIENT EDUCATION: Education details: kinesio tape Person educated: Patient Education  method: Explanation, Demonstration, and Verbal cues Education comprehension: verbalized understanding, returned demonstration, verbal cues required, and needs further education  HOME EXERCISE PROGRAM: 02/09/24 - Affected UE ROM and Yellow Theraputty. Access Code: [email or web address]. 02/15/2024: L handed typing words 02/21/24 - Digit ext with theraputty or rubber band (access code: [email or web address]). Functional reach: Handwritten/Verbal instructions: 1 set in front of mirror, 1 set without mirror: Reaching overhead to touch top of head with affected hand, reaching overhead to touch back of head with affected hand, reaching arm forward and touching nose with affected UE - 10 reps, 2 sets, 2x per day. 03/07/2024: Theraband; dowel 03/13/24 - graded up to pink theraputty. Reps/sets adjusted: Access Code: [email or web address]. 03/15/24 - Verbal instructions: Daily typing practice. LUE overhead reaching - Access Code: [email or web address]  GOALS: Goals reviewed with patient? Yes  SHORT TERM GOALS: Target date: 03/09/24  Pt will ind demo understanding of HEP using visual handouts. Baseline: new to outpt OT Goal status: MET  2.  Pt will ind recall at least 3 energy conservation strategies. Baseline: Activity tolerance: Pt reported I'm doing more than I was doing in the hospital, but I definitely get tired easier and take more breaks.  03/13/24 - Pt ind recalled breathing, spacing out activities throughout the day, naps, taking breaks PRN.  Goal status: MET  3.  Pt and/or family will return demo of personal e-stim unit use and electrode placement for  affected UE.  Baseline: pt reported she has ordered a personal e-stim unit.  02/09/24 - Education and handout provided. Pt returned demo of using personal e-stim unit for wrist flex/ext of affected UE. Goal status: MET  4.  Pt will report improved ability to use LUE during overhead reaching tasks with minimal use of RUE to compensate, such as hair care tasks and washing hair. Baseline: pt reported difficulty with hair styling (e.g. putting hair in ponytail, curling hair) 03/13/24 - Pt reported continued difficulty with putting hair in a bun and washing hair. Goal status: in progress  LONG TERM GOALS: Target date: 04/06/24  Patient will report at least two-point increase in average PSFS score or at least three-point increase in a single activity score indicating functionally significant improvement given minimum detectable change using adaptive strategies and A/E PRN. Baseline: PSF: 3.0 total score (See above for individual activity scores)  03/15/24 - PSFS: 7.0 Goal status: MET and ongoing  2.  Patient will demonstrate at least  37 lbs LUE grip strength as needed to open jars and other containers.  Baseline: Grip strength: Right: 48.9, 53.5, 53.3 (51.9 lbs average) lbs; Left: 24.2, 22.2, 22.7 (23.1 lbs average) lbs 03/07/2024: LUE - 33.1 lbs Goal status: MET on 03/07/24 and revised on 03/13/24  3.  Patient will demo improved FM coordination as evidenced by completing nine-hole peg with use of LUE in 30 seconds or less.  Baseline: 9 Hole Peg test: Right: 20 sec; Left: 40 sec 03/07/2024: 34 sec Goal status: in progress  4.  Pt will verbalize understanding of A/E, adaptive strategies for functional tasks as needed for return to work considerations. Baseline: Work activities: Pt expressed concerns with FM coordination and concerns about return to work considerations, such as coordination required for drawing up syringes from a vial for medication administration, completing IVs, typing, navigating  phone. Goal status: in progress  ASSESSMENT:  CLINICAL IMPRESSION: Patient demonstrating good response and understanding to kinesio taping of thumbs to promote better positioning for grasp assumption. Will continue to progress towards goals as written.   PERFORMANCE  DEFICITS: in functional skills including ADLs, IADLs, coordination, dexterity, proprioception, sensation, tone, ROM, strength, pain, muscle spasms, flexibility, Fine motor control, Gross motor control, mobility, balance, endurance, and UE functional use, cognitive skills including attention and energy/drive, and psychosocial skills including environmental adaptation.   IMPAIRMENTS: are limiting patient from ADLs, IADLs, rest and sleep, work, leisure, and social participation.   CO-MORBIDITIES: may have co-morbidities  that affects occupational performance. Patient will benefit from skilled OT to address above impairments and improve overall function.  REHAB POTENTIAL: Good  PLAN:  OT FREQUENCY: 2x/week  OT DURATION: 6 weeks (dates extended to allow for scheduling)  PLANNED INTERVENTIONS: 97168 OT Re-evaluation, 97535 self care/ADL training, 16109 therapeutic exercise, 97530 therapeutic activity, 97112 neuromuscular re-education, 97140 manual therapy, 97035 ultrasound, 97018 paraffin, 60454 fluidotherapy, 97010 moist heat, 97010 cryotherapy, 97760 Orthotic Initial, 97761 Prosthetic Initial, 97763 Orthotic/Prosthetic subsequent, passive range of motion, functional mobility training, visual/perceptual remediation/compensation, energy conservation, patient/family education, and DME and/or AE instructions  RECOMMENDED OTHER SERVICES: PT eval completed, SLP eval scheduled  CONSULTED AND AGREED WITH PLAN OF CARE: Patient and family member  PLAN FOR NEXT SESSION: work sim  UPOC/Progress Report due visit #13 before 04/06/24  Review theraband and dowel HEPs PRN  Functional use of LUE: how is typing going? Hair styling?  Review  ROM and theraputty HEP PRN FM coordination and functional reaching activities - e.g. Blaze Pods, small pegs in-hand manipulation, thumb ROM Overhead strengthening - e.g. Squigz at [employer], Pegs at upright mirror, Ball at wall [employer])  Re-apply kinesiotape to thumb and consider ?handout, ?have pt/family video with pt's personal cell phone (with pt permission) to improve carryover to home environment  UBE/SciFit for endurance  Altamease Asters, OT 03/27/2024, 6:59 PM

## 2024-03-27 NOTE — Therapy (Signed)
 OUTPATIENT PHYSICAL THERAPY NEURO TREATMENT    Patient Name: Faith Jordan MRN: [id number] DOB:[birth year], 28 y.o., female Today's Date: 03/27/2024   PCP: Gabriel John, NP REFERRING PROVIDER: Genetta Kenning, MD   END OF SESSION:  PT End of Session - 03/27/24 1405     Visit Number 16    Number of Visits 25   recert   Date for PT Re-Evaluation 05/10/24   recert, to allow for scheduling delays   Authorization Type [employer]    PT Start Time 0109    PT Stop Time 1445    PT Time Calculation (min) 42 min    Equipment Utilized During Treatment --    Activity Tolerance Patient tolerated treatment well    Behavior During Therapy [facility] for tasks assessed/performed                 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   Acute flank pain 06/02/2022   Angio-edema 04/27/2016   Angioedema    Aquagenic angio-edema-urticaria    Asthma    no problems /not used recently   Dysautonomia (HCC)    Dysrhythmia    sinus tachycardia   Fibroid    right breast, adenoma   Headache(784.0)    History of COVID-19 11/21/2020   Hives 12/22/2015   Pneumonia    hx  6th grade   Sensation of fullness in both ears 02/02/2023   Snoring 02/11/2022   Urinary frequency 02/12/2019   Vaginal yeast infection 06/02/2022   Vasculitis (HCC)    Past Surgical History:  Procedure Laterality Date   ADENOIDECTOMY     BREAST LUMPECTOMY Right    BREAST LUMPECTOMY Right    MASS EXCISION Right 10/07/2014   Procedure: EXCISION OF RIGHT BREAST MASS;  Surgeon: Adalberto Hollow, MD;  Location: [facility] OR;  Service: General;  Laterality: Right;   TONSILLECTOMY AND ADENOIDECTOMY     TOOTH EXTRACTION     TRANSESOPHAGEAL ECHOCARDIOGRAM (CATH LAB) N/A 01/10/2024   Procedure: TRANSESOPHAGEAL ECHOCARDIOGRAM;  Surgeon: Hugh Madura, MD;  Location: [facility] INVASIVE CV LAB;  Service: Cardiovascular;  Laterality: N/A;   Patient Active Problem List   Diagnosis  Date Noted   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) 02/10/2024   Coping style affecting medical condition 01/19/2024   History of CVA (cerebrovascular accident) 01/12/2024   Stroke of right basal ganglia (HCC) 01/07/2024   Non-restorative sleep 05/26/2022   Sleep paralysis, 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   Vivid dream 05/26/2022   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   Excessive daytime sleepiness 05/26/2022   Sleep disturbance 03/17/2022   Panic attacks 02/11/2022   Vitamin B12 deficiency 07/30/2021   Vitamin D  deficiency 07/30/2021   Family history of hypothyroidism 07/30/2021   GAD (generalized anxiety disorder) 03/18/2020   Fatigue 11/06/2019   Chronic back pain 07/06/2019   Preventative health care 04/27/2018   Migraines 04/25/2018   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   Anemia 01/04/2018   Headache disorder 12/29/2015   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   Sinus tachycardia 12/22/2015   Dysautonomia (HCC) 12/22/2015   Dysautonomia, familial (HCC) 04/22/2015   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   Neurocardiogenic syncope 04/06/2013   Intermittent palpitations 04/06/2013    ONSET DATE: 01/26/2024 (referral)   REFERRING DIAG:  I63.81 (ICD-10-CM) - Other cerebral infarction due to occlusion or stenosis of small artery    THERAPY DIAG:  Muscle weakness (generalized)  Other abnormalities of gait and mobility  Other lack of coordination  Rationale for Evaluation and Treatment: Rehabilitation  SUBJECTIVE:                                                                                                                                                                                             SUBJECTIVE STATEMENT: Pt reports her appointment w/Dr. Sharl Davies went well. States her short-term disability runs out on July 10 and she has a follow-up w/Dr. Sharl Davies on July 3rd to discuss lifting restrictions and return to  work. Will plan on returning to work after July 10th for 4 hour days 2 days per week. No falls.  Pt accompanied by: self  PERTINENT HISTORY: familiar hemiplegic migraine, angioedema 2017,dysautonomia; infarct right internal capsule/ R basal ganglia stroke  PAIN:  Are you having pain? No    PRECAUTIONS: Fall  RED FLAGS: None   WEIGHT BEARING RESTRICTIONS: No  FALLS: Has patient fallen in last 6 months? No  LIVING ENVIRONMENT: Lives with: lives with their family Lives in: House/apartment Stairs: Yes: External: 4 steps; bilateral but cannot reach both Has following equipment at home: shower chair  PLOF: Independent  PATIENT GOALS: "Being normal again. Being able to go back to work and be independent"   OBJECTIVE:  Note: Objective measures were completed at Evaluation unless otherwise noted.  DIAGNOSTIC FINDINGS: MRI of brain on 01/07/24  IMPRESSION: 1. 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 2. Susceptibility related calcifications in the globus pallidus bilaterally. This likely reflects the sequela of chronic microvascular ischemia.  COGNITION: Overall cognitive status: Within functional limits for tasks assessed   SENSATION: Pt denies numbness/tingling in L hemibody   MUSCLE TONE: Non-fatiguing clonus of LLE  POSTURE: No Significant postural limitations  LOWER EXTREMITY ROM:     Active  Right Eval Left Eval  Hip flexion    Hip extension    Hip abduction    Hip adduction    Hip internal rotation    Hip external rotation    Knee flexion    Knee extension    Ankle dorsiflexion    Ankle plantarflexion    Ankle inversion    Ankle eversion     (Blank rows = not tested)  LOWER EXTREMITY MMT:  Tested in seated position   MMT Right Eval Left Eval  Hip flexion 5 4+  Hip extension    Hip abduction 5 5  Hip adduction 5 5  Hip internal rotation    Hip external rotation    Knee flexion 5 5  Knee extension  5 4+  Ankle dorsiflexion 5 5  Ankle plantarflexion    Ankle inversion    Ankle eversion    (Blank rows = not tested)  BED MOBILITY:  Not tested  TRANSFERS: Sit to stand: Complete Independence  Assistive device utilized: None     Stand to sit: Complete Independence  Assistive device utilized: None      RAMP:  Not tested  CURB:  Not tested  GAIT: Gait pattern: step through pattern, decreased arm swing- Left, decreased step length- Right, decreased stance time- Left, decreased stride length, decreased hip/knee flexion- Left, lateral hip instability, and wide BOS Distance walked: Various clinic distances  Assistive device utilized: None Level of assistance: Modified independence Comments: Pt walks very guarded, as if she feels unstable. No instability noted other than single instance of L knee buckling which pt able to self-correct.    PATIENT SURVEYS:  ABC scale to be obtained    VITALS  There were no vitals filed for this visit.                                                                                                                              TREATMENT:   Ther Act Entirety of session spent performing work simulation tasks:  On high-low mat in splint room Had pt practice placing "bed pan" (box of gloves) under therapist. Pt challenged w/rolling therapist but states she would have help at work. Educated pt on how to have pt assist (reaching for rail, crossing their legs) if able.  Practiced boosting therapist in bed via bed sheet. Obtained second therapist to assist pt and had pt stand on R side to boost w/LUE. Pt performed well w/no instability.  With John in [place], had pt push Stedy around gym x3 laps (one lap w/turn to R, 2 laps w/turn to L) to imitate pushing a hospital bed. Since clinic's Stedy drifts to L side, pt very challenged initially. However, pt able to independently push Stedy and problem solve how to navigate obstacles in gym independently w/no LOB. On  3rd lap, had pt pull a BP machine w/LUE as well and pt was able to manage well.  At // bar, pallof press rotations w/red theraband, x10 reps per side, for improved core stability w/rotation and BUE strength. Added to HEP (see bolded below).      PATIENT EDUCATION: Education details: Updates to HEP Person educated: Patient Education method: Medical illustrator Education comprehension: verbalized understanding and returned demonstration  HOME EXERCISE PROGRAM: Access Code: [email or web address] URL: [email or web address] Date: 02/13/2024 Prepared by: Burleigh Carp Ernestina Joe  Exercises - Half Kneeling Lift  - 1 x daily - 7 x weekly - 1 sets - 10 reps - Down Dog with Bent Knees and Heels on Mat  - 1 x daily - 7 x weekly - 2-3 reps - 15-30 seconds hold - Bird Dog  - 1 x daily - 7 x weekly - 3 sets - 10 reps - Lateral Shuffles  -  1 x daily - 7 x weekly - 3 sets - 10 reps - Braided Sidestepping  - 1 x daily - 7 x weekly - 3 sets - 10 reps - Standing Balance Activity: Plyometric Modified Lower Extremity Jumping Jack  - 1 x daily - 7 x weekly - 3 sets - 10 reps - Standing Forward Step Taps with Counter Support  - 1 x daily - 7 x weekly - 3 sets - 10 reps (at bottom of steps with no UE support, red TB around ankles) - Ankle Inversion with Resistance  - 1 x daily - 7 x weekly - 3 sets - 10 reps  From CIR Therapist: Also instructed pt in 2 additional exercises but no printed versions available: - tall kneeling to half kneeling - sit to half kneeling and then return to sit. Perform to each side.   Verbally added 02/21/24: -squats at countertop with chair behind you  Verbally added 03/13/24: - eccentric heel taps  - jumping over line fwd/retro/laterally   Verbally added 03/27/24  - Pallof press rotations w/red theraband   GOALS: Goals reviewed with patient? Yes  SHORT TERM GOALS: Target date: 02/28/2024  Pt will improve FGA to 28/30 for decreased fall risk  Baseline: 21/30, 25/30  (5/13) Goal status: IN PROGRESS  2.  HiMat to be assessed at STG check and LTG updated  Baseline: 11/54 (5/13) Goal status: MET  3.  Pt will improve gait velocity to at least 3.4 ft/s for improved gait efficiency and return to PLOF Baseline: 3.0 ft/sec (eval), 3.31 ft/sec (5/13) Goal status: IN PROGRESS  4.  ABC scale to be assessed and LTG updated  Baseline: 70.6% (5/13) Goal status: MET  5.  Pt will demonstrate proper lifting technique of 25# weight from floor for proper body mechanics and safety at work Baseline:  Goal status: MET  LONG TERM GOALS: Target date: 03/13/2024  Pt will improve gait velocity to at least 3.5 ft/s for improved gait efficiency and independence Baseline: 3.0 ft/sec (eval), 3.31 ft/sec (5/13), 3.57 ft/sec (5/29) Goal status: MET  2.  Pt will improve her score on the HiMat to >/= 16/54 to demonstrate improved balance and function Baseline: 11/54 (5/13), 21/54 (5/29) Goal status: MET  3.  Pt will improve her ABC scale score to >/= 80% for improved confidence in her balance. Baseline: 70.6% (5/13), 90% (5/29) Goal status: MET  4.  Pt will be able to carry 10# object in BUEs up/down 4 steps for improved coordination and safety in work environment   Baseline: can only carry w/single UE, can carry with BUE mod I (5/29) Goal status: MET  5.  Pt will perform [place] get up w/5# weight for improved shoulder stability OH, core stability and high level balance  Baseline: CGA to min A to perform (5/29) Goal status: MET  NEW SHORT TERM GOALS:   Target date: 04/05/2024  Pt will improve her score on the HiMat to >/= 26/54 to demonstrate improved balance and function Baseline: 11/54 (5/13), 21/54 (5/29) Goal status: INITIAL  2.  Pt will be able to perform 5 deep squats and return to standing to demonstrate improved function Baseline: 2 squats before unable to stand up without physical assist Goal status: INITIAL  3.  Manual TUG to be assessed and LTG  set Baseline: to be assessed Goal status: MET  4.  Cognitive TUG to be assessed and LTG set Baseline: to be assessed Goal status: MET    NEW LONG TERM GOALS:  Target date: 04/26/2024  Pt will improve gait velocity to at least 3.75 ft/s for improved gait efficiency and independence Baseline: 3.0 ft/sec (eval), 3.31 ft/sec (5/13), 3.57 ft/sec (5/29) Goal status: INITIAL  2.  Pt will improve her score on the HiMat to >/= 31/54 to demonstrate improved balance and function Baseline: 11/54 (5/13), 21/54 (5/29) Goal status: INITIAL  3.  Pt will be able to perform 10 deep squats and return to standing to demonstrate improved function Baseline: 2 squats before unable to stand up without physical assist Goal status: INITIAL  4.  Pt will improve manual TUG to less than or equal to 12 seconds for improved functional mobility and decreased fall risk.  Baseline: 17.59s Goal status: INITIAL  5.  Pt will improve cognitive TUG to less than or equal to 10 seconds for improved functional mobility and decreased fall risk.  Baseline: 12.35s counting backwards by 8 Goal status: INITIAL      ASSESSMENT:  CLINICAL IMPRESSION: Emphasis of skilled PT session on return to work simulation and core stability. Practiced placing a "bed pan" and boosting a pt in bed, which pt performed well but will require +2 assist for heavier pts. Pt managed pushing Stedy well and demonstrated independence w/problem solving and motor planning when navigating around gym. Pt continues to be limited by LUE weakness but was able to compensate well w/tasks today without risking pt safety.  Continue POC.   OBJECTIVE IMPAIRMENTS: Abnormal gait, decreased activity tolerance, decreased balance, decreased coordination, decreased endurance, decreased mobility, decreased strength, increased muscle spasms, impaired UE functional use, improper body mechanics, and pain  ACTIVITY LIMITATIONS: carrying, lifting, squatting, stairs,  reach over head, hygiene/grooming, and locomotion level  PARTICIPATION LIMITATIONS: meal prep, cleaning, laundry, interpersonal relationship, driving, shopping, community activity, occupation, and yard work  PERSONAL FACTORS: 1 comorbidity: R internal capsule infarct/R basal ganglia stroke are also affecting patient's functional outcome.   REHAB POTENTIAL: Excellent  CLINICAL DECISION MAKING: Evolving/moderate complexity  EVALUATION COMPLEXITY: Moderate  PLAN:  PT FREQUENCY: 2-3x/week (starting w/2x following eval)  PT DURATION: 6 weeks  PLANNED INTERVENTIONS: 81191- PT Re-evaluation, 97750- Physical Performance Testing, 97110-Therapeutic exercises, 97530- Therapeutic activity, [id number]- Neuromuscular re-education, 97535- Self Care, [id number]- Manual therapy, [phone]- Gait training, [phone]- Canalith repositioning, [phone]- Aquatic Therapy, Patient/Family education, Balance training, Stair training, Taping, Dry Needling, Joint mobilization, Spinal mobilization, Vestibular training, and DME instructions  PLAN FOR NEXT SESSION: High level agility, elliptical, L quad eccentric strength, OH tasks. Practice w/plastic needles Sonia Durand has these) in oranges and pin the needle to targets? Cognitive dual tasking, needs to work on deep squats and return to standing from squats, agility drills, treadmill training with focus on increasing speed and potentially running? Half kneel, ladder drills, carrying weight with both hands (on rockerboard, stairs), agility ladder. Jumping. Wobble bar   Pt goals: squats (for patient care), time based tasks, not lose my balance (would lose balance with perturbations/reactive balance), multitasking/cog dual tasking (talking while doing tasks). Work simulation tasks (bed mobility, transfers, holding items)   Leviticus Harton E Daxtin Leiker, PT, DPT 03/27/2024, 2:52 PM

## 2024-03-27 NOTE — Progress Notes (Signed)
 [facility] Counselor/Therapist Progress Note  Patient ID: Faith Jordan, MRN: [id number],    Date:  03/27/2024  Time Spent: 58 minutes  Time in:9:02  Time out: 10:00  Treatment Type:- The patient was seen via video visit.  She gave verbal consent for the session to be on video on caregility.  The patient was in her home alone and therapist was in the office.  Reported Symptoms: anxiety, panic attacks  Mental Status Exam: Appearance:  Casual     Behavior: Appropriate  Motor: Normal  Speech/Language:  Normal Rate  Affect: blunted  Mood: sad  Thought process: normal  Thought content:   WNL  Sensory/Perceptual disturbances:   WNL  Orientation: oriented to person, place, time/date, and situation  Attention: Good  Concentration: Good  Memory: WNL  Fund of knowledge:  Good  Insight:   Fair  Judgment:  Good  Impulse Control: Good   Risk Assessment: Danger to Self:  No Self-injurious Behavior: No Danger to Others: No Duty to Warn:no Physical Aggression / Violence:No  Access to Firearms a concern: No  Gang Involvement:No   Subjective: The patient was seen for an individual therapy session  via video visit today.  The patient gave verbal permission for the session to be on video on caregility and she is aware of the limitations of telehealth.  The patient was in her home  and therapist was in the office.  The patient presents with a blunted affect and mood is sad.   The patient has moved back to her house.  She reports that she was anxious about moving back and her mom was going to stay there but she thinks she would have been anxious no matter when she moved back.  We talked about her going back to work in July and she is going to start slow and do 8 hours a week at first.  We talked about things she is doing for herself and she still is concerned because she does not have enough to keep her busy during the day.  We talked briefly about her fear that she is not going to  meet anyone now that she has had a stroke.  I tried to redirect her and explained that just because she has had this happen and does not mean that she will not meet someone or find someone that can be caring and loving.  She continues to do a negative thinking but knows when she does it.  Encouraged her to change her narrative.  Interventions: Cognitive Behavioral Therapy and Assertiveness/Communication,, problem solving, psychoeducation,  EMDR as indicated, Meditation and mindfulness,   Diagnosis:GAD (generalized anxiety disorder)  Major depressive disorder, recurrent episode, moderate (HCC)  Plan: Client Abilities/Strengths  Intelligent, insightful, motivated  Client Treatment Preferences  Outpatient Individual therapy every other week  Client Statement of Needs  " I feel better now, but still need some help with anxiety"  Treatment Level  Outpatient Individual therapy  Symptoms  Frustration and anxiety related to providing oversight and caretaking to an aging, ailing, and dependent  parent.: No Description Entered (Status: improved). Hypervigilance (e.g., feeling constantly on edge,  experiencing concentration difficulties, having trouble falling or staying asleep, exhibiting a general  state of irritability).: No Description Entered (Status: improved). Motor tension (e.g., restlessness,  tiredness, shakiness, muscle tension).: No Description Entered (Status: improved).  Problems Addressed  Anxiety, Phase Of Life Problems, Anxiety  Goals 1. Learn and implement coping skills that result in a reduction of anxiety  and  worry, and improved daily functioning. Objective Learn and implement calming skills to reduce overall anxiety and manage anxiety symptoms. Target Date: 2025-08-09Frequency: Weekly Progress: 40 Modality: individual  Related Interventions 1. Teach the client calming/relaxation skills (e.g., applied relaxation, progressive muscle  relaxation, cue controlled relaxation;  mindful breathing; biofeedback) and how to discriminate  better between relaxation and tension; teach the client how to apply these skills to his/her daily  life (e.g., New Directions in Progressive Muscle Relaxation by Fara Hone, and  Hazlett-Stevens; Treating Generalized Anxiety Disorder by Rygh and Joya Nissen). Objective Identify, challenge, and replace biased, fearful self-talk with positive, realistic, and empowering selftalk. Target Date: 2024-05-26 Frequency: weekly Progress: 30 Modality: individual Related Interventions 1. Explore the client's schema and self-talk that mediate his/her fear response; assist him/her in  challenging the biases; replace the distorted messages with reality-based alternatives and  positive, realistic self-talk that will increase his/her self-confidence in coping with irrational  fears (see Cognitive Therapy of Anxiety Disorders by Anderson Kaufman). Objective Learn and implement problem-solving strategies for realistically addressing worries. Target Date: 2025-08-09Frequency: weekly Progress: 40 Modality: individual 2. Resolve conflicted feelings and adapt to the new life circumstances. Objective Apply problem-solving skills to current circumstances. Target Date: 2024-05-26 Frequency: weekly Progress: 20 Modality: individual Related Interventions 1. Teach the client problem-resolution skills (e.g., defining the problem clearly, brainstorming  multiple solutions, listing the pros and cons of each solution, seeking input from others,  selecting and implementing a plan of action, evaluating outcome, and readjusting plan as  necessary).   3. Stabilize anxiety level while increasing ability to function on a daily  basis. Diagnosis F33.1  Major depressive disorder, moderate 300.02 (Generalized anxiety disorder) - Open - [Signifier: n/a]  Axis  none 309.28 (Adjustment disorder with mixed anxiety and depressed  mood) - Open - [Signifier: n/a]   Adjustment Disorder,  With Anxiety   Marital conflict  Major Depressive disorder, moderate  Conditions For Discharge Achievement of treatment goals and objectives.  The patient approved this plan.   Faith Vila G Rawan Riendeau, LCSW

## 2024-03-28 ENCOUNTER — Other Ambulatory Visit: Payer: Self-pay

## 2024-03-29 ENCOUNTER — Ambulatory Visit: Admitting: Physical Therapy

## 2024-03-29 DIAGNOSIS — M6281 Muscle weakness (generalized): Secondary | ICD-10-CM | POA: Diagnosis not present

## 2024-03-29 DIAGNOSIS — I69354 Hemiplegia and hemiparesis following cerebral infarction affecting left non-dominant side: Secondary | ICD-10-CM | POA: Diagnosis not present

## 2024-03-29 DIAGNOSIS — R2689 Other abnormalities of gait and mobility: Secondary | ICD-10-CM | POA: Diagnosis not present

## 2024-03-29 DIAGNOSIS — R29898 Other symptoms and signs involving the musculoskeletal system: Secondary | ICD-10-CM | POA: Diagnosis not present

## 2024-03-29 DIAGNOSIS — R2681 Unsteadiness on feet: Secondary | ICD-10-CM

## 2024-03-29 DIAGNOSIS — I6381 Other cerebral infarction due to occlusion or stenosis of small artery: Secondary | ICD-10-CM | POA: Diagnosis not present

## 2024-03-29 DIAGNOSIS — R278 Other lack of coordination: Secondary | ICD-10-CM

## 2024-03-29 DIAGNOSIS — M5459 Other low back pain: Secondary | ICD-10-CM | POA: Diagnosis not present

## 2024-03-29 DIAGNOSIS — R41841 Cognitive communication deficit: Secondary | ICD-10-CM | POA: Diagnosis not present

## 2024-03-29 DIAGNOSIS — R29818 Other symptoms and signs involving the nervous system: Secondary | ICD-10-CM | POA: Diagnosis not present

## 2024-03-29 NOTE — Therapy (Signed)
 OUTPATIENT PHYSICAL THERAPY NEURO TREATMENT    Patient Name: Faith Jordan MRN: [id number] DOB:1995/12/11, 28 y.o., female Today's Date: 03/29/2024   PCP: Gabriel John, NP REFERRING PROVIDER: Genetta Kenning, MD   END OF SESSION:  PT End of Session - 03/29/24 1321     Visit Number 17    Number of Visits 25   recert   Date for PT Re-Evaluation 05/10/24   recert, to allow for scheduling delays   Authorization Type [employer]    PT Start Time 1319    PT Stop Time 1359    PT Time Calculation (min) 40 min    Activity Tolerance Patient tolerated treatment well    Behavior During Therapy [facility] for tasks assessed/performed              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   Acute flank pain 06/02/2022   Angio-edema 04/27/2016   Angioedema    Aquagenic angio-edema-urticaria    Asthma    no problems /not used recently   Dysautonomia (HCC)    Dysrhythmia    sinus tachycardia   Fibroid    right breast, adenoma   Headache(784.0)    History of COVID-19 11/21/2020   Hives 12/22/2015   Pneumonia    hx  6th grade   Sensation of fullness in both ears 02/02/2023   Snoring 02/11/2022   Urinary frequency 02/12/2019   Vaginal yeast infection 06/02/2022   Vasculitis (HCC)    Past Surgical History:  Procedure Laterality Date   ADENOIDECTOMY     BREAST LUMPECTOMY Right    BREAST LUMPECTOMY Right    MASS EXCISION Right 10/07/2014   Procedure: EXCISION OF RIGHT BREAST MASS;  Surgeon: Adalberto Hollow, MD;  Location: [facility] OR;  Service: General;  Laterality: Right;   TONSILLECTOMY AND ADENOIDECTOMY     TOOTH EXTRACTION     TRANSESOPHAGEAL ECHOCARDIOGRAM (CATH LAB) N/A 01/10/2024   Procedure: TRANSESOPHAGEAL ECHOCARDIOGRAM;  Surgeon: Hugh Madura, MD;  Location: [facility] INVASIVE CV LAB;  Service: Cardiovascular;  Laterality: N/A;   Patient Active Problem List   Diagnosis Date Noted   Spastic hemiparesis of left  nondominant side due to acute cerebral infarction (HCC) 02/10/2024   Coping style affecting medical condition 01/19/2024   History of CVA (cerebrovascular accident) 01/12/2024   Stroke of right basal ganglia (HCC) 01/07/2024   Non-restorative sleep 05/26/2022   Sleep paralysis, 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   Vivid dream 05/26/2022   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   Excessive daytime sleepiness 05/26/2022   Sleep disturbance 03/17/2022   Panic attacks 02/11/2022   Vitamin B12 deficiency 07/30/2021   Vitamin D  deficiency 07/30/2021   Family history of hypothyroidism 07/30/2021   GAD (generalized anxiety disorder) 03/18/2020   Fatigue 11/06/2019   Chronic back pain 07/06/2019   Preventative health care 04/27/2018   Migraines 04/25/2018   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   Anemia 01/04/2018   Headache disorder 12/29/2015   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   Sinus tachycardia 12/22/2015   Dysautonomia (HCC) 12/22/2015   Dysautonomia, familial (HCC) 04/22/2015   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   Neurocardiogenic syncope 04/06/2013   Intermittent palpitations 04/06/2013    ONSET DATE: 01/26/2024 (referral)   REFERRING DIAG:  I63.81 (ICD-10-CM) - Other cerebral infarction due to occlusion or stenosis of small artery    THERAPY DIAG:  Muscle weakness (generalized)  Other lack of coordination  Unsteadiness on feet  Rationale for  Evaluation and Treatment: Rehabilitation  SUBJECTIVE:                                                                                                                                                                                             SUBJECTIVE STATEMENT: Pt reports doing well. Has noted if she tries to rush, her leg does not move as well. No falls or pain today    Pt accompanied by: self  PERTINENT HISTORY: familiar hemiplegic migraine, angioedema 2017,dysautonomia; infarct right internal capsule/ R basal ganglia  stroke  PAIN:  Are you having pain? No    PRECAUTIONS: Fall  RED FLAGS: None   WEIGHT BEARING RESTRICTIONS: No  FALLS: Has patient fallen in last 6 months? No  LIVING ENVIRONMENT: Lives with: lives with their family Lives in: House/apartment Stairs: Yes: External: 4 steps; bilateral but cannot reach both Has following equipment at home: shower chair  PLOF: Independent  PATIENT GOALS: Being normal again. Being able to go back to work and be independent   OBJECTIVE:  Note: Objective measures were completed at Evaluation unless otherwise noted.  DIAGNOSTIC FINDINGS: MRI of brain on 01/07/24  IMPRESSION: 1. 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 2. Susceptibility related calcifications in the globus pallidus bilaterally. This likely reflects the sequela of chronic microvascular ischemia.  COGNITION: Overall cognitive status: Within functional limits for tasks assessed   SENSATION: Pt denies numbness/tingling in L hemibody   MUSCLE TONE: Non-fatiguing clonus of LLE  POSTURE: No Significant postural limitations  LOWER EXTREMITY ROM:     Active  Right Eval Left Eval  Hip flexion    Hip extension    Hip abduction    Hip adduction    Hip internal rotation    Hip external rotation    Knee flexion    Knee extension    Ankle dorsiflexion    Ankle plantarflexion    Ankle inversion    Ankle eversion     (Blank rows = not tested)  LOWER EXTREMITY MMT:  Tested in seated position   MMT Right Eval Left Eval  Hip flexion 5 4+  Hip extension    Hip abduction 5 5  Hip adduction 5 5  Hip internal rotation    Hip external rotation    Knee flexion 5 5  Knee extension 5 4+  Ankle dorsiflexion 5 5  Ankle plantarflexion    Ankle inversion    Ankle eversion    (Blank rows = not tested)  BED MOBILITY:  Not tested  TRANSFERS: Sit to stand: Complete Independence  Assistive device utilized: None  Stand to  sit: Complete Independence  Assistive device utilized: None      RAMP:  Not tested  CURB:  Not tested  GAIT: Gait pattern: step through pattern, decreased arm swing- Left, decreased step length- Right, decreased stance time- Left, decreased stride length, decreased hip/knee flexion- Left, lateral hip instability, and wide BOS Distance walked: Various clinic distances  Assistive device utilized: None Level of assistance: Modified independence Comments: Pt walks very guarded, as if she feels unstable. No instability noted other than single instance of L knee buckling which pt able to self-correct.    PATIENT SURVEYS:  ABC scale to be obtained    VITALS  There were no vitals filed for this visit.                                                                                                                              TREATMENT:   Ther Act 5 Blaze pods on random reach setting for improved agility, turns, visual scanning and work simulation.  Performed on 2-2.5 minute intervals with 1-2 min rest periods.  Pt performed mod I. Round 1:  w/pods placed in vertical line over 30' on 2 minute interval.  Pods set on 5s timeout. 36 hits w/cues to tap pods w/legs. Round 2:  w/pods placed in various locations (in cabinet, on mirror, on steps, on curb and in // bars) on 2.5 minute intervals. 10s timeout. 23 hits. Round 3:  same setup as round two but had pt holding cup of water.  15 hits. Notable errors/deficits:  pt demonstrates decreased eccentric control of LLE but no LOB noted  90-90 KB carries w/weight upside down, x60' each UE. Significant difficulty maintaining radial deviation of LUE to hold KB upright, requiring light min A to assist.  Seated radial deviation w/2# weight in LUE, x20 reps. Educated pt on how to perform at home w/resistance band.     PATIENT EDUCATION: Education details: Continue HEP, working on radial deviation at home  Person educated: Patient Education method:  Medical illustrator Education comprehension: verbalized understanding and returned demonstration  HOME EXERCISE PROGRAM: Access Code: [email or web address] URL: [email or web address] Date: 02/13/2024 Prepared by: Burleigh Carp Gill Delrossi  Exercises - Half Kneeling Lift  - 1 x daily - 7 x weekly - 1 sets - 10 reps - Down Dog with Bent Knees and Heels on Mat  - 1 x daily - 7 x weekly - 2-3 reps - 15-30 seconds hold - Bird Dog  - 1 x daily - 7 x weekly - 3 sets - 10 reps - Lateral Shuffles  - 1 x daily - 7 x weekly - 3 sets - 10 reps - Braided Sidestepping  - 1 x daily - 7 x weekly - 3 sets - 10 reps - Standing Balance Activity: Plyometric Modified Lower Extremity Jumping Jack  - 1 x daily - 7 x weekly - 3 sets - 10 reps - Standing Forward Step Taps with Counter Support  -  1 x daily - 7 x weekly - 3 sets - 10 reps (at bottom of steps with no UE support, red TB around ankles) - Ankle Inversion with Resistance  - 1 x daily - 7 x weekly - 3 sets - 10 reps  From CIR Therapist: Also instructed pt in 2 additional exercises but no printed versions available: - tall kneeling to half kneeling - sit to half kneeling and then return to sit. Perform to each side.   Verbally added 02/21/24: -squats at countertop with chair behind you  Verbally added 03/13/24: - eccentric heel taps  - jumping over line fwd/retro/laterally   Verbally added 03/27/24  - Pallof press rotations w/red theraband   GOALS: Goals reviewed with patient? Yes  SHORT TERM GOALS: Target date: 02/28/2024  Pt will improve FGA to 28/30 for decreased fall risk  Baseline: 21/30, 25/30 (5/13) Goal status: IN PROGRESS  2.  HiMat to be assessed at STG check and LTG updated  Baseline: 11/54 (5/13) Goal status: MET  3.  Pt will improve gait velocity to at least 3.4 ft/s for improved gait efficiency and return to PLOF Baseline: 3.0 ft/sec (eval), 3.31 ft/sec (5/13) Goal status: IN PROGRESS  4.  ABC scale to be assessed and LTG  updated  Baseline: 70.6% (5/13) Goal status: MET  5.  Pt will demonstrate proper lifting technique of 25# weight from floor for proper body mechanics and safety at work Baseline:  Goal status: MET  LONG TERM GOALS: Target date: 03/13/2024  Pt will improve gait velocity to at least 3.5 ft/s for improved gait efficiency and independence Baseline: 3.0 ft/sec (eval), 3.31 ft/sec (5/13), 3.57 ft/sec (5/29) Goal status: MET  2.  Pt will improve her score on the HiMat to >/= 16/54 to demonstrate improved balance and function Baseline: 11/54 (5/13), 21/54 (5/29) Goal status: MET  3.  Pt will improve her ABC scale score to >/= 80% for improved confidence in her balance. Baseline: 70.6% (5/13), 90% (5/29) Goal status: MET  4.  Pt will be able to carry 10# object in BUEs up/down 4 steps for improved coordination and safety in work environment   Baseline: can only carry w/single UE, can carry with BUE mod I (5/29) Goal status: MET  5.  Pt will perform [place] get up w/5# weight for improved shoulder stability OH, core stability and high level balance  Baseline: CGA to min A to perform (5/29) Goal status: MET  NEW SHORT TERM GOALS:   Target date: 04/05/2024  Pt will improve her score on the HiMat to >/= 26/54 to demonstrate improved balance and function Baseline: 11/54 (5/13), 21/54 (5/29) Goal status: INITIAL  2.  Pt will be able to perform 5 deep squats and return to standing to demonstrate improved function Baseline: 2 squats before unable to stand up without physical assist Goal status: INITIAL  3.  Manual TUG to be assessed and LTG set Baseline: to be assessed Goal status: MET  4.  Cognitive TUG to be assessed and LTG set Baseline: to be assessed Goal status: MET    NEW LONG TERM GOALS:  Target date: 04/26/2024  Pt will improve gait velocity to at least 3.75 ft/s for improved gait efficiency and independence Baseline: 3.0 ft/sec (eval), 3.31 ft/sec (5/13), 3.57 ft/sec  (5/29) Goal status: INITIAL  2.  Pt will improve her score on the HiMat to >/= 31/54 to demonstrate improved balance and function Baseline: 11/54 (5/13), 21/54 (5/29) Goal status: INITIAL  3.  Pt  will be able to perform 10 deep squats and return to standing to demonstrate improved function Baseline: 2 squats before unable to stand up without physical assist Goal status: INITIAL  4.  Pt will improve manual TUG to less than or equal to 12 seconds for improved functional mobility and decreased fall risk.  Baseline: 17.59s Goal status: INITIAL  5.  Pt will improve cognitive TUG to less than or equal to 10 seconds for improved functional mobility and decreased fall risk.  Baseline: 12.35s counting backwards by 8 Goal status: INITIAL      ASSESSMENT:  CLINICAL IMPRESSION: Emphasis of skilled PT session on improved agility, visual scanning and anticipatory balance strategies. Pt reports she feels weird when moving quickly, so added extra stress to agility tasks by putting a timer on blaze pods and having pt hold water. Pt tolerated activities well but does demonstrate decreased eccentric control w/L quad and anterior tib. Pt reports and demonstrates difficulty w/radial deviation on LUE, so verbally added to HEP.  Continue POC.   OBJECTIVE IMPAIRMENTS: Abnormal gait, decreased activity tolerance, decreased balance, decreased coordination, decreased endurance, decreased mobility, decreased strength, increased muscle spasms, impaired UE functional use, improper body mechanics, and pain  ACTIVITY LIMITATIONS: carrying, lifting, squatting, stairs, reach over head, hygiene/grooming, and locomotion level  PARTICIPATION LIMITATIONS: meal prep, cleaning, laundry, interpersonal relationship, driving, shopping, community activity, occupation, and yard work  PERSONAL FACTORS: 1 comorbidity: R internal capsule infarct/R basal ganglia stroke are also affecting patient's functional outcome.    REHAB POTENTIAL: Excellent  CLINICAL DECISION MAKING: Evolving/moderate complexity  EVALUATION COMPLEXITY: Moderate  PLAN:  PT FREQUENCY: 2-3x/week (starting w/2x following eval)  PT DURATION: 6 weeks  PLANNED INTERVENTIONS: 82956- PT Re-evaluation, 97750- Physical Performance Testing, 97110-Therapeutic exercises, 97530- Therapeutic activity, [id number]- Neuromuscular re-education, 97535- Self Care, 21308- Manual therapy, [phone]- Gait training, [phone]- Canalith repositioning, [phone]- Aquatic Therapy, Patient/Family education, Balance training, Stair training, Taping, Dry Needling, Joint mobilization, Spinal mobilization, Vestibular training, and DME instructions  PLAN FOR NEXT SESSION: High level agility, elliptical, L quad eccentric strength, OH tasks. Practice w/plastic needles Sonia Durand has these) in oranges and pin the needle to targets? Cognitive dual tasking, needs to work on deep squats and return to standing from squats, agility drills, treadmill training with focus on increasing speed and potentially running? Half kneel, ladder drills, carrying weight with both hands (on rockerboard, stairs), agility ladder. Jumping. Wobble bar   Pt goals: squats (for patient care), time based tasks, not lose my balance (would lose balance with perturbations/reactive balance), multitasking/cog dual tasking (talking while doing tasks). Work simulation tasks (bed mobility, transfers, holding items)   Jaleigha Deane E Trevian Hayashida, PT, DPT 03/29/2024, 2:01 PM

## 2024-03-30 ENCOUNTER — Other Ambulatory Visit: Payer: Self-pay

## 2024-04-02 ENCOUNTER — Ambulatory Visit: Admitting: Physical Therapy

## 2024-04-02 DIAGNOSIS — R29818 Other symptoms and signs involving the nervous system: Secondary | ICD-10-CM | POA: Diagnosis not present

## 2024-04-02 DIAGNOSIS — R2681 Unsteadiness on feet: Secondary | ICD-10-CM

## 2024-04-02 DIAGNOSIS — M6281 Muscle weakness (generalized): Secondary | ICD-10-CM

## 2024-04-02 DIAGNOSIS — R41841 Cognitive communication deficit: Secondary | ICD-10-CM | POA: Diagnosis not present

## 2024-04-02 DIAGNOSIS — R278 Other lack of coordination: Secondary | ICD-10-CM

## 2024-04-02 DIAGNOSIS — R2689 Other abnormalities of gait and mobility: Secondary | ICD-10-CM | POA: Diagnosis not present

## 2024-04-02 DIAGNOSIS — I6381 Other cerebral infarction due to occlusion or stenosis of small artery: Secondary | ICD-10-CM | POA: Diagnosis not present

## 2024-04-02 DIAGNOSIS — M5459 Other low back pain: Secondary | ICD-10-CM | POA: Diagnosis not present

## 2024-04-02 DIAGNOSIS — R29898 Other symptoms and signs involving the musculoskeletal system: Secondary | ICD-10-CM | POA: Diagnosis not present

## 2024-04-02 DIAGNOSIS — I69354 Hemiplegia and hemiparesis following cerebral infarction affecting left non-dominant side: Secondary | ICD-10-CM | POA: Diagnosis not present

## 2024-04-02 NOTE — Therapy (Signed)
 OUTPATIENT PHYSICAL THERAPY NEURO TREATMENT    Patient Name: Faith Jordan MRN: [id number] DOB:Apr 12, 1996, 28 y.o., female Today's Date: 04/02/2024   PCP: Gabriel John, NP REFERRING PROVIDER: Genetta Kenning, MD   END OF SESSION:  PT End of Session - 04/02/24 1103     Visit Number 18    Number of Visits 25   recert   Date for PT Re-Evaluation 05/10/24   recert, to allow for scheduling delays   Authorization Type [employer]    PT Start Time 1101    PT Stop Time 1146    PT Time Calculation (min) 45 min    Activity Tolerance Patient tolerated treatment well    Behavior During Therapy [facility] for tasks assessed/performed              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   Acute flank pain 06/02/2022   Angio-edema 04/27/2016   Angioedema    Aquagenic angio-edema-urticaria    Asthma    no problems /not used recently   Dysautonomia (HCC)    Dysrhythmia    sinus tachycardia   Fibroid    right breast, adenoma   Headache(784.0)    History of COVID-19 11/21/2020   Hives 12/22/2015   Pneumonia    hx  6th grade   Sensation of fullness in both ears 02/02/2023   Snoring 02/11/2022   Urinary frequency 02/12/2019   Vaginal yeast infection 06/02/2022   Vasculitis (HCC)    Past Surgical History:  Procedure Laterality Date   ADENOIDECTOMY     BREAST LUMPECTOMY Right    BREAST LUMPECTOMY Right    MASS EXCISION Right 10/07/2014   Procedure: EXCISION OF RIGHT BREAST MASS;  Surgeon: Adalberto Hollow, MD;  Location: [facility] OR;  Service: General;  Laterality: Right;   TONSILLECTOMY AND ADENOIDECTOMY     TOOTH EXTRACTION     TRANSESOPHAGEAL ECHOCARDIOGRAM (CATH LAB) N/A 01/10/2024   Procedure: TRANSESOPHAGEAL ECHOCARDIOGRAM;  Surgeon: Hugh Madura, MD;  Location: [facility] INVASIVE CV LAB;  Service: Cardiovascular;  Laterality: N/A;   Patient Active Problem List   Diagnosis Date Noted   Spastic hemiparesis of left  nondominant side due to acute cerebral infarction (HCC) 02/10/2024   Coping style affecting medical condition 01/19/2024   History of CVA (cerebrovascular accident) 01/12/2024   Stroke of right basal ganglia (HCC) 01/07/2024   Non-restorative sleep 05/26/2022   Sleep paralysis, 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   Vivid dream 05/26/2022   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   Excessive daytime sleepiness 05/26/2022   Sleep disturbance 03/17/2022   Panic attacks 02/11/2022   Vitamin B12 deficiency 07/30/2021   Vitamin D  deficiency 07/30/2021   Family history of hypothyroidism 07/30/2021   GAD (generalized anxiety disorder) 03/18/2020   Fatigue 11/06/2019   Chronic back pain 07/06/2019   Preventative health care 04/27/2018   Migraines 04/25/2018   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   Anemia 01/04/2018   Headache disorder 12/29/2015   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   Sinus tachycardia 12/22/2015   Dysautonomia (HCC) 12/22/2015   Dysautonomia, familial (HCC) 04/22/2015   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   Neurocardiogenic syncope 04/06/2013   Intermittent palpitations 04/06/2013    ONSET DATE: 01/26/2024 (referral)   REFERRING DIAG:  I63.81 (ICD-10-CM) - 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 Could not sleep last night due to pain and tried to do some stretches (prone press ups, figure 4) but nothing helped. Feels like she is walking weird again. Rating pain as  a 4/10 currently, was a 7/10 this morning.    Pt accompanied by: self  PERTINENT HISTORY: familiar hemiplegic migraine, angioedema 2017,dysautonomia; infarct right internal capsule/ R basal ganglia stroke  PAIN:  Are you having pain? Yes: NPRS scale: 4/10 Pain location: Low back Pain description: Achy/tight     PRECAUTIONS: Fall  RED FLAGS: None   WEIGHT BEARING RESTRICTIONS: No  FALLS: Has patient fallen in last 6 months? No  LIVING ENVIRONMENT: Lives with: lives with their family Lives in: House/apartment Stairs: Yes: External: 4 steps; bilateral but cannot reach both Has following equipment at home: shower chair  PLOF: Independent  PATIENT GOALS: Being normal again. Being able to go back to work and be independent   OBJECTIVE:  Note: Objective measures were completed at Evaluation unless otherwise noted.  DIAGNOSTIC FINDINGS: MRI of brain on 01/07/24  IMPRESSION: 1. 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 2. Susceptibility related calcifications in the globus pallidus bilaterally. This likely reflects the sequela of chronic microvascular ischemia.  COGNITION: Overall cognitive status: Within functional limits for tasks assessed   SENSATION: Pt denies numbness/tingling in L hemibody   MUSCLE TONE: Non-fatiguing clonus of LLE  POSTURE: No Significant postural limitations  LOWER EXTREMITY ROM:     Active  Right Eval Left Eval  Hip flexion    Hip extension    Hip abduction    Hip adduction    Hip internal rotation    Hip external rotation    Knee flexion    Knee extension    Ankle dorsiflexion    Ankle plantarflexion    Ankle inversion    Ankle eversion     (Blank rows = not tested)  LOWER EXTREMITY MMT:  Tested in seated position   MMT Right Eval Left Eval  Hip flexion 5 4+  Hip extension    Hip abduction 5 5  Hip adduction 5 5  Hip internal rotation    Hip external rotation    Knee  flexion 5  5  Knee extension 5 4+  Ankle dorsiflexion 5 5  Ankle plantarflexion    Ankle inversion    Ankle eversion    (Blank rows = not tested)  BED MOBILITY:  Not tested  TRANSFERS: Sit to stand: Complete Independence  Assistive device utilized: None     Stand to sit: Complete Independence  Assistive device utilized: None      RAMP:  Not tested  CURB:  Not tested  GAIT: Gait pattern: step through pattern, decreased arm swing- Left, decreased step length- Right, decreased stance time- Left, decreased stride length, decreased hip/knee flexion- Left, lateral hip instability, and wide BOS Distance walked: Various clinic distances  Assistive device utilized: None Level of assistance: Modified independence Comments: Pt walks very guarded, as if she feels unstable. No instability noted other than single instance of L knee buckling which pt able to self-correct.    PATIENT SURVEYS:  ABC scale to be obtained    VITALS  There were no vitals filed for this visit.                                                                                                                              TREATMENT:   Ther Act The following were performed for improved spinal mobility, functional mobility of hips and pain modulation. All added to HEP (see bolded below):  Modified thomas stretch, x3 minutes per side. Increased difficulty on RLE >LLE  Supine iron crosses w/added overpressure from therapist, x5 reps per side w/30-90s holds each rep. Palpable spasms of R hip flexors noted when performing on R side. No spasms noted on L side  Prone psoas mobilization w/lacrosse ball, x4 minutes per side. Increased tenderness on L side vs R Pigeon pose, x3 minute hold per side. Significant difficulty performing on L side, so recommended pt place pillows or blankets under L hip when performing at home. Pt rated pain as 0/10 at end of session     PATIENT EDUCATION: Education details: Updates to HEP, asking  massage therapist to work on psoas  Person educated: Patient Education method: Medical illustrator Education comprehension: verbalized understanding and returned demonstration  HOME EXERCISE PROGRAM: Access Code: [email or web address] URL: [email or web address] Date: 02/13/2024 Prepared by: Burleigh Carp Linah Klapper  Exercises - Half Kneeling Lift  - 1 x daily - 7 x weekly - 1 sets - 10 reps - Down Dog with Bent Knees and Heels on Mat  - 1 x daily - 7 x weekly - 2-3 reps - 15-30 seconds hold - Bird Dog  - 1 x daily - 7 x weekly - 3 sets - 10 reps - Lateral Shuffles  - 1 x daily - 7 x weekly - 3 sets - 10 reps - Braided Sidestepping  - 1 x daily - 7 x weekly - 3 sets - 10 reps - Standing Balance Activity: Plyometric Modified Lower Extremity Jumping Jack  - 1 x daily - 7 x weekly - 3 sets -  10 reps - Standing Forward Step Taps with Counter Support  - 1 x daily - 7 x weekly - 3 sets - 10 reps (at bottom of steps with no UE support, red TB around ankles) - Ankle Inversion with Resistance  - 1 x daily - 7 x weekly - 3 sets - 10 reps - Modified Thomas Stretch  - 1 x daily - 7 x weekly - 2-3 sets - 1-2 minutes  hold - Supine Straight Leg Lumbar Rotation Stretch  - 1 x daily - 7 x weekly - 3 sets - 10 reps - Psoas Mobilization with Small Ball  - 1 x daily - 7 x weekly - 3 sets - 2-3 minute hold - Pigeon Pose  - 1 x daily - 7 x weekly - 1-2 sets - 1-2 minutes hold  From CIR Therapist: Also instructed pt in 2 additional exercises but no printed versions available: - tall kneeling to half kneeling - sit to half kneeling and then return to sit. Perform to each side.   Verbally added 02/21/24: -squats at countertop with chair behind you  Verbally added 03/13/24: - eccentric heel taps  - jumping over line fwd/retro/laterally   Verbally added 03/27/24  - Pallof press rotations w/red theraband   GOALS: Goals reviewed with patient? Yes  SHORT TERM GOALS: Target date: 02/28/2024  Pt will improve  FGA to 28/30 for decreased fall risk  Baseline: 21/30, 25/30 (5/13) Goal status: IN PROGRESS  2.  HiMat to be assessed at STG check and LTG updated  Baseline: 11/54 (5/13) Goal status: MET  3.  Pt will improve gait velocity to at least 3.4 ft/s for improved gait efficiency and return to PLOF Baseline: 3.0 ft/sec (eval), 3.31 ft/sec (5/13) Goal status: IN PROGRESS  4.  ABC scale to be assessed and LTG updated  Baseline: 70.6% (5/13) Goal status: MET  5.  Pt will demonstrate proper lifting technique of 25# weight from floor for proper body mechanics and safety at work Baseline:  Goal status: MET  LONG TERM GOALS: Target date: 03/13/2024  Pt will improve gait velocity to at least 3.5 ft/s for improved gait efficiency and independence Baseline: 3.0 ft/sec (eval), 3.31 ft/sec (5/13), 3.57 ft/sec (5/29) Goal status: MET  2.  Pt will improve her score on the HiMat to >/= 16/54 to demonstrate improved balance and function Baseline: 11/54 (5/13), 21/54 (5/29) Goal status: MET  3.  Pt will improve her ABC scale score to >/= 80% for improved confidence in her balance. Baseline: 70.6% (5/13), 90% (5/29) Goal status: MET  4.  Pt will be able to carry 10# object in BUEs up/down 4 steps for improved coordination and safety in work environment   Baseline: can only carry w/single UE, can carry with BUE mod I (5/29) Goal status: MET  5.  Pt will perform [place] get up w/5# weight for improved shoulder stability OH, core stability and high level balance  Baseline: CGA to min A to perform (5/29) Goal status: MET  NEW SHORT TERM GOALS:   Target date: 04/05/2024  Pt will improve her score on the HiMat to >/= 26/54 to demonstrate improved balance and function Baseline: 11/54 (5/13), 21/54 (5/29) Goal status: INITIAL  2.  Pt will be able to perform 5 deep squats and return to standing to demonstrate improved function Baseline: 2 squats before unable to stand up without physical assist Goal  status: INITIAL  3.  Manual TUG to be assessed and LTG set Baseline: to  be assessed Goal status: MET  4.  Cognitive TUG to be assessed and LTG set Baseline: to be assessed Goal status: MET    NEW LONG TERM GOALS:  Target date: 04/26/2024  Pt will improve gait velocity to at least 3.75 ft/s for improved gait efficiency and independence Baseline: 3.0 ft/sec (eval), 3.31 ft/sec (5/13), 3.57 ft/sec (5/29) Goal status: INITIAL  2.  Pt will improve her score on the HiMat to >/= 31/54 to demonstrate improved balance and function Baseline: 11/54 (5/13), 21/54 (5/29) Goal status: INITIAL  3.  Pt will be able to perform 10 deep squats and return to standing to demonstrate improved function Baseline: 2 squats before unable to stand up without physical assist Goal status: INITIAL  4.  Pt will improve manual TUG to less than or equal to 12 seconds for improved functional mobility and decreased fall risk.  Baseline: 17.59s Goal status: INITIAL  5.  Pt will improve cognitive TUG to less than or equal to 10 seconds for improved functional mobility and decreased fall risk.  Baseline: 12.35s counting backwards by 8 Goal status: INITIAL      ASSESSMENT:  CLINICAL IMPRESSION: Emphasis of skilled PT session on functional hip and mobility for reduced pain levels. Pt reports she started having low back pain about two days ago and was so bad last night that she was up at 3am doing stretches. Pt exhibits significant tightness in bilateral hip abductors and flexors, w/palpable spasms on R side w/activity. Pt reported no back pain by end of session and has a massage scheduled this week, so recommended she tell her therapist to work on hip flexors. Continue POC.   OBJECTIVE IMPAIRMENTS: Abnormal gait, decreased activity tolerance, decreased balance, decreased coordination, decreased endurance, decreased mobility, decreased strength, increased muscle spasms, impaired UE functional use, improper body  mechanics, and pain  ACTIVITY LIMITATIONS: carrying, lifting, squatting, stairs, reach over head, hygiene/grooming, and locomotion level  PARTICIPATION LIMITATIONS: meal prep, cleaning, laundry, interpersonal relationship, driving, shopping, community activity, occupation, and yard work  PERSONAL FACTORS: 1 comorbidity: R internal capsule infarct/R basal ganglia stroke are also affecting patient's functional outcome.   REHAB POTENTIAL: Excellent  CLINICAL DECISION MAKING: Evolving/moderate complexity  EVALUATION COMPLEXITY: Moderate  PLAN:  PT FREQUENCY: 2-3x/week (starting w/2x following eval)  PT DURATION: 6 weeks  PLANNED INTERVENTIONS: 81191- PT Re-evaluation, 97750- Physical Performance Testing, 97110-Therapeutic exercises, 97530- Therapeutic activity, [id number]- Neuromuscular re-education, 97535- Self Care, [id number]- Manual therapy, [phone]- Gait training, [phone]- Canalith repositioning, [phone]- Aquatic Therapy, Patient/Family education, Balance training, Stair training, Taping, Dry Needling, Joint mobilization, Spinal mobilization, Vestibular training, and DME instructions  PLAN FOR NEXT SESSION: High level agility, elliptical, L quad eccentric strength, OH tasks. Practice w/plastic needles Sonia Durand has these) in oranges and pin the needle to targets? Cognitive dual tasking, needs to work on deep squats and return to standing from squats, agility drills, treadmill training with focus on increasing speed and potentially running? Half kneel, ladder drills, carrying weight with both hands (on rockerboard, stairs), agility ladder. Jumping. Wobble bar   Pt goals: squats (for patient care), time based tasks, not lose my balance (would lose balance with perturbations/reactive balance), multitasking/cog dual tasking (talking while doing tasks). Work simulation tasks (bed mobility, transfers, holding items)   Jerold Yoss E Jamilynn Whitacre, PT, DPT 04/02/2024, 11:46 AM

## 2024-04-03 ENCOUNTER — Ambulatory Visit: Admitting: Neurology

## 2024-04-03 ENCOUNTER — Encounter: Payer: Self-pay | Admitting: Neurology

## 2024-04-03 ENCOUNTER — Other Ambulatory Visit: Payer: Self-pay

## 2024-04-03 VITALS — BP 110/71 | HR 83 | Ht 67.0 in | Wt 156.0 lb

## 2024-04-03 DIAGNOSIS — R252 Cramp and spasm: Secondary | ICD-10-CM | POA: Diagnosis not present

## 2024-04-03 DIAGNOSIS — I69398 Other sequelae of cerebral infarction: Secondary | ICD-10-CM

## 2024-04-03 DIAGNOSIS — I69344 Monoplegia of lower limb following cerebral infarction affecting left non-dominant side: Secondary | ICD-10-CM | POA: Diagnosis not present

## 2024-04-03 DIAGNOSIS — I6381 Other cerebral infarction due to occlusion or stenosis of small artery: Secondary | ICD-10-CM | POA: Diagnosis not present

## 2024-04-03 DIAGNOSIS — G43409 Hemiplegic migraine, not intractable, without status migrainosus: Secondary | ICD-10-CM

## 2024-04-03 MED ORDER — MODAFINIL 100 MG PO TABS
50.0000 mg | ORAL_TABLET | Freq: Every day | ORAL | 5 refills | Status: DC
  Filled 2024-04-03: qty 30, 30d supply, fill #0

## 2024-04-03 NOTE — Progress Notes (Signed)
 [facility]    Provider:  Dr Tresia Fruit Requesting Provider: Gabriel John, NP Primary Care Provider:  Gabriel John, NP  CC:  Cryptogenic stroke  More fatigued than naything, she gets very tired, she has got to close her eyes. Strength is improved. She doesn't feel coordinated. Fatigues, so tired she can;t function if busy but improving. She can sleep 12 hours, she got up and did some chores and then ready for a nap. Mothr  had modafinil in the past and did very well on it. She had sleep apnea as a baby. She has always had a lot of fatigue, she saw Dr. Albertina Hugger in 2023 for excessive daytime somnolence, mother had a diagnosis of narcolepsy. 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 Spasticity and increased tone left leg discussed botox  and also discussed with my colleage Dr. Gracie Lav.    Faith Jordan complains of symptoms per HPI as well as the following symptoms: none . Pertinent negatives and positives per HPI. All others negative   HPI 02/07/2024:  Faith Jordan is a 28 y.o. female here as requested by Gabriel John, NP for Stroke. has Dysautonomia, familial (HCC); ANS (autonomic nervous system) disease; Neurocardiogenic syncope; Intermittent palpitations; Anemia; Migraines; Familial hemiplegic migraine; Preventative health care; Chronic back pain; Fibroadenoma of right breast; Headache disorder; Sinus tachycardia; Dysautonomia (HCC); Fatigue; GAD (generalized anxiety disorder); Vitamin B12 deficiency; Vitamin D  deficiency; Family history of hypothyroidism; Panic attacks; Sleep disturbance; Non-restorative sleep; Sleep paralysis, recurrent isolated; Vivid dream; Retrognathia; Excessive daytime sleepiness; Stroke of right basal ganglia (HCC); History of CVA (cerebrovascular accident); Coping style affecting  medical condition; 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  This is an extremely lovely young lady with a history of hemiplegic migraine, angioedema, dysautonomia and asthma here with her mother who also has hemiplegic migraines.  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  She presented on January 07, 2024 for left-sided weakness in the face and left arm and leg without headache; none of her prior hemiplegic migraines in the last year are associated with limb weakness.  She went to bed asymptomatic the night prior and woke up at 11:30 PM and could not use her left side since she had a history of hemiplegic migraine she took her migraine medication and went back to sleep.  When she woke up in the morning she still could not use her left side and took more medications without improvement which prompted her to seek emergency room care unfortunately she was approximately 20 hours out from the start of symptoms when seen in the emergency room.  A code stroke was activated, NIH stroke scale was 6 which included a 1 for facial palsy, 3 for motor left arm, 1 for motor left leg, 1 for dysarthria, CT of the head without acute abnormality and CTA were obtained which showed no large vessel occlusion, MRI demonstrated an infarct in the right basal ganglia.  She was loaded with Plavix  in the emergency room.  MRI of the brain showed a right 10 mm infarct in the posterior limb of the right internal capsule.  MRI of the brain also showed calcifications in the globus pallidus bilaterally unclear etiology otherwise basal  ganglia were within normal limits.  Calcifications are of unknown etiology, and unknown if played any role in the stroke.  Had extensive stroke workup including TEE, TTE, lower extremity venous Doppler, TCD  bubble study which were all negative, LDL was 106, A1c was 4.9, UDS negative, she was put on dual antiplatelet therapy and statin, currently there is still question regarding the etiology for her stroke.  Of particular note she had been using estrogen for OCT, denied smoking alcohol or illicit drug use.  Faith Jordan is here with her mother today.  She feels she is improving.  The situation has obviously been quite disturbing for her and her mother and she is teary today.  She continues to improve as far as strength.  She denies any sensory changes, she states she had left-sided numbness (points to the radial distribution in the left hand) which has resolved.  She denies any vision changes, further dysarthria or speech problems, swallow problems, falls, gait abnormality but does report she continues to have left-sided weakness and some mild left-sided facial droop.  Faith Jordan has a history of hemiplegic migraines sometimes associated with left facial droop lasting 3 to 4 hours with resolution occurring on average once every 2 to 3 months but does endorse migraines twice a month without aura.  As stated above, her mother does have hemiplegic migraines as well and she also does well on Emgality  but she has never had a stroke.  Her father has never had a stroke however he does have atrial fibrillation which was diagnosed early in his 75s.  Her sister also has migraines but no hemiplegia associated with the migraines.  She was doing extremely well on Emgality , she was placed on Emgality  years prior due to her mother having had great success on Emgality  as well.  Since then she has been doing well, this is quite unexpected for her.  She was admitted to inpatient rehabilitation in has done extremely well and continues to improve.   Reviewed notes, labs and imaging from outside physicians, which showed:  CLINICAL DATA:  New onset left-sided weakness.   EXAM: MRI HEAD WITHOUT AND WITH CONTRAST    TECHNIQUE: Multiplanar, multiecho pulse sequences of the brain and surrounding structures were obtained without and with intravenous contrast.   CONTRAST:  8mL GADAVIST  GADOBUTROL  1 MMOL/ML IV SOLN   COMPARISON:  CT head without contrast and CT angio head and neck 01/07/2024.   FINDINGS: Brain: A 10 mm acute nonhemorrhagic infarct is present in the posterior limb of the right internal capsule. T2 and FLAIR hyperintensities are consistent with the time frame nearly 24 hours. No acute hemorrhage or mass lesion is present. Susceptibility related calcifications in the globus pallidus noted bilaterally.   Basal ganglia are otherwise within normal limits. The ventricles are of normal size. No significant extraaxial fluid collection is present.   The brainstem and cerebellum are within normal limits. Midline structures are within normal limits.   Vascular: Flow is present in the major intracranial arteries.   Skull and upper cervical spine: The craniocervical junction is normal. Upper cervical spine is within normal limits. Marrow signal is unremarkable.   Sinuses/Orbits: The paranasal sinuses and mastoid air cells are clear. The globes and orbits are within normal limits.   IMPRESSION: 1. 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 2. Susceptibility related calcifications in the globus pallidus bilaterally. This likely reflects the sequela of chronic microvascular ischemia.  CLINICAL DATA:  Left hemiplegia.  EXAM: CT ANGIOGRAPHY HEAD AND NECK WITH AND WITHOUT CONTRAST   TECHNIQUE: Multidetector CT imaging of the head and neck was performed using the standard protocol during bolus administration of intravenous contrast. Multiplanar CT image reconstructions and MIPs were obtained to evaluate the vascular anatomy. Carotid stenosis measurements (when applicable) are obtained utilizing NASCET criteria, using the distal internal  carotid diameter as the denominator.   RADIATION DOSE REDUCTION: This exam was performed according to the departmental dose-optimization program which includes automated exposure control, adjustment of the mA and/or kV according to Faith Jordan size and/or use of iterative reconstruction technique.   CONTRAST:  75mL OMNIPAQUE  IOHEXOL  350 MG/ML SOLN   COMPARISON:  CT head without contrast 01/07/2024 at 6:41 p.m.   FINDINGS: CTA NECK FINDINGS   Aortic arch: A 3 vessel arch configuration is present. No significant atherosclerotic changes are present at the aortic arch. Great vessel origins are widely patent. No aneurysm or dissection is present.   Right carotid system: The right common carotid artery is within normal limits. Bifurcation is within normal limits. The cervical right ICA is normal.   Left carotid system: The left common carotid artery is within normal limits. Bifurcation is unremarkable. Cervical left ICA is normal.   Vertebral arteries: The vertebral arteries are codominant. Both vertebral arteries originate from the subclavian arteries without significant stenosis. No significant stenosis is present in either vertebral artery in the neck.   Skeleton: The vertebral body heights and alignment are normal. Mild reversal of the normal cervical lordosis may be positional. No focal osseous lesions are present.   Other neck: The soft tissues of the neck are otherwise unremarkable. Salivary glands are within normal limits. Thyroid  is normal. No significant adenopathy is present. No focal mucosal or submucosal lesions are present.   Upper chest: The lung apices are clear. The thoracic inlet is within normal limits.   Review of the MIP images confirms the above findings   CTA HEAD FINDINGS   Anterior circulation: Internal carotid arteries are within normal limits from the skull base to the ICA termini. The A1 and M1 segments are normal. The anterior communicating artery  is patent. MCA bifurcations are normal bilaterally. The ACA and MCA branch vessels are normal bilaterally. No aneurysm is present.   Posterior circulation: PICA origins are visualized and normal. The vertebrobasilar junction and basilar artery are normal. The superior cerebellar arteries are patent bilaterally. Both posterior cerebral arteries originate from basilar tip. Small posterior communicating arteries are present. The PCA branch vessels are normal bilaterally. No aneurysm is present.   Venous sinuses: The dural sinuses are patent. The straight sinus and deep cerebral veins are intact. Cortical veins are within normal limits. No significant vascular malformation is evident.   Anatomic variants: None   Review of the MIP images confirms the above findings   IMPRESSION: 1. Normal CTA of the neck. 2. Normal CTA Circle of Willis without significant proximal stenosis, aneurysm, or branch vessel occlusion.  EXAM: CT HEAD WITHOUT CONTRAST   TECHNIQUE: Contiguous axial images were obtained from the base of the skull through the vertex without intravenous contrast.   RADIATION DOSE REDUCTION: This exam was performed according to the departmental dose-optimization program which includes automated exposure control, adjustment of the mA and/or kV according to Faith Jordan size and/or use of iterative reconstruction technique.   COMPARISON:  Report of CT head without contrast 04/28/2005.   FINDINGS: Brain: Dense calcifications are present within the globus pallidus bilaterally. No acute infarct, hemorrhage, or mass lesion  is present. No significant white matter lesions are present. The deep brain nuclei are otherwise within normal limits. The ventricles are of normal size. No significant extraaxial fluid collection is present.   The brainstem and cerebellum are within normal limits. Midline structures are within normal limits.   Vascular: No hyperdense vessel or unexpected  calcification.   Skull: Calvarium is intact. No focal lytic or blastic lesions are present. No significant extracranial soft tissue lesion is present.   Sinuses/Orbits: The paranasal sinuses and mastoid air cells are clear. The globes and orbits are within normal limits.   ASPECTS [facility] Stroke Program Early CT Score)   - Ganglionic level infarction (caudate, lentiform nuclei, internal capsule, insula, M1-M3 cortex): 7/7   - Supraganglionic infarction (M4-M6 cortex): 3/3   Total score (0-10 with 10 being normal): 10/10   IMPRESSION: 1. No acute intracranial abnormality or significant interval change. 2. Aspects is 10/10. 3. Dense calcifications within the globus pallidus bilaterally. This is a nonspecific finding but can be seen in the setting of Fahr's disease.  February 08, 2024: Vitamin D  31.01, B12 280, CMP normal with BUN 9 and creatinine 0.94, CBC normal Mar 11, 2024 magnesium  normal 1.9 01/09/2024 ferritin 19, iron 75, TIBC 566, saturation 13, TIBC 491, January 08, 2024 ANA negative, cardiolipin antibodies IgG less than 9/IgM less than 9/IgA less than 9 normal, prothrombin gene mutation not detected, factor V Leiden not detected, homocystine normal, beta-2  glycoprotein less than 9 normal, lupus anticoagulant within normal limits, protein S total 60 and activity 94 within normal limits, protein C 108 total and activity 125 within normal limits, Antithrombin III  105 within normal limits January 07, 2024: LDL inpatient 10 6 HDL 30 total cholesterol 151 triglycerides 73, HIV negative, hemoglobin A1c 4.9, CRP 0.8 normal, sed rate 1 normal, PTH within normal limits, urine rapid drug screen negative, hCG negative, ethanol negative November 29, 2023 TSH normal 1.08  Review of Systems: Faith Jordan complains of symptoms per HPI as well as the following symptoms none. Pertinent negatives and positives per HPI. All others negative.   Social History   Socioeconomic History   Marital status:  Significant Other    Spouse name: Not on file   Number of children: Not on file   Years of education: Not on file   Highest education level: Bachelor's degree (e.g., BA, AB, BS)  Occupational History   Not on file  Tobacco Use   Smoking status: Never   Smokeless tobacco: Never  Vaping Use   Vaping status: Never Used  Substance and Sexual Activity   Alcohol use: No   Drug use: No   Sexual activity: Yes  Other Topics Concern   Not on file  Social History Narrative   Lives with fiance   R handed   Caffeine : 1 drink a day   Social Drivers of Corporate investment banker Strain: Not on file  Food Insecurity: No Food Insecurity (01/09/2024)   Hunger Vital Sign    Worried About Running Out of Food in the Last Year: Never true    Ran Out of Food in the Last Year: Never true  Transportation Needs: No Transportation Needs (01/09/2024)   PRAPARE - Administrator, Civil Service (Medical): No    Lack of Transportation (Non-Medical): No  Physical Activity: Not on file  Stress: Not on file  Social Connections: Not on file  Intimate Partner Violence: Not At Risk (01/09/2024)   Humiliation, Afraid, Rape, and Kick questionnaire  Fear of Current or Ex-Partner: No    Emotionally Abused: No    Physically Abused: No    Sexually Abused: No    Family History  Problem Relation Age of Onset   Depression Mother    Migraines Mother    Narcolepsy Mother    Hypothyroidism Mother    Breast cancer Maternal Grandmother 70   Myasthenia gravis Maternal Grandfather    Asthma Other    Cancer Other    Epilepsy Other    Allergic rhinitis Neg Hx    Angioedema Neg Hx    Atopy Neg Hx    Eczema Neg Hx    Immunodeficiency Neg Hx    Urticaria Neg Hx    Stroke Neg Hx     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   Acute flank pain 06/02/2022   Angio-edema 04/27/2016   Angioedema    Aquagenic angio-edema-urticaria    Asthma    no problems  /not used recently   Dysautonomia (HCC)    Dysrhythmia    sinus tachycardia   Fibroid    right breast, adenoma   Headache(784.0)    History of COVID-19 11/21/2020   Hives 12/22/2015   Pneumonia    hx  6th grade   Sensation of fullness in both ears 02/02/2023   Snoring 02/11/2022   Urinary frequency 02/12/2019   Vaginal yeast infection 06/02/2022   Vasculitis (HCC)     Faith Jordan Active Problem List   Diagnosis Date Noted   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 (CVA) 04/05/2024   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) 02/10/2024   Coping style affecting medical condition 01/19/2024   History of CVA (cerebrovascular accident) 01/12/2024   Stroke of right basal ganglia (HCC) 01/07/2024   Non-restorative sleep 05/26/2022   Sleep paralysis, 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   Vivid dream 05/26/2022   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   Excessive daytime sleepiness 05/26/2022   Sleep disturbance 03/17/2022   Panic attacks 02/11/2022   Vitamin B12 deficiency 07/30/2021   Vitamin D  deficiency 07/30/2021   Family history of hypothyroidism 07/30/2021   GAD (generalized anxiety disorder) 03/18/2020   Fatigue 11/06/2019   Chronic back pain 07/06/2019   Preventative health care 04/27/2018   Migraines 04/25/2018   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   Anemia 01/04/2018   Headache disorder 12/29/2015   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   Sinus tachycardia 12/22/2015   Dysautonomia (HCC) 12/22/2015   Dysautonomia, familial (HCC) 04/22/2015   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   Neurocardiogenic syncope 04/06/2013   Intermittent palpitations 04/06/2013    Past Surgical History:  Procedure Laterality Date   ADENOIDECTOMY     BREAST LUMPECTOMY Right    BREAST LUMPECTOMY Right    MASS EXCISION Right 10/07/2014   Procedure: EXCISION OF RIGHT BREAST MASS;  Surgeon: Adalberto Hollow, MD;  Location: [facility];  Service:  General;  Laterality: Right;   TONSILLECTOMY AND ADENOIDECTOMY     TOOTH EXTRACTION     TRANSESOPHAGEAL ECHOCARDIOGRAM (CATH LAB) N/A 01/10/2024   Procedure: TRANSESOPHAGEAL ECHOCARDIOGRAM;  Surgeon: Hugh Madura, MD;  Location: [facility] INVASIVE CV LAB;  Service: Cardiovascular;  Laterality: N/A;    Current Outpatient Medications  Medication Sig Dispense Refill   acetaminophen  (TYLENOL ) 325 MG tablet Take 2 tablets (650 mg total) by mouth every 6 (six) hours as needed for mild pain (pain score 1-3), fever or headache.  atorvastatin  (LIPITOR ) 40 MG tablet Take 1 tablet (40 mg total) by mouth daily for cholesterol. 90 tablet 3   baclofen  (LIORESAL ) 10 MG tablet Take 1 tablet (10 mg total) by mouth 3 (three) times daily. (Faith Jordan taking differently: Take 10 mg by mouth in the morning and at bedtime.) 270 each 3   botulinum toxin Type A  (BOTOX ) 200 units injection Provider to inject 155 units into the muscles of the head and neck every 12 weeks. Discard remainder. 1 each 3   Cholecalciferol (VITAMIN D3) 50 MCG (2000 UT) capsule Take 2,000 Units by mouth in the morning.     Coenzyme Q10 (COQ-10 PO) Take 1 tablet by mouth in the morning.     Galcanezumab -gnlm (EMGALITY ) 120 MG/ML SOAJ Inject 1 Dose into the skin every 30 (thirty) days.     LORazepam  (ATIVAN ) 1 MG tablet Take 1 tablet (1 mg total) by mouth every 6 (six) hours as needed for anxiety. 30 tablet 0   modafinil (PROVIGIL) 100 MG tablet Take 0.5-1 tablets (50-100 mg total) by mouth daily. 30 tablet 5   ondansetron  (ZOFRAN ) 8 MG tablet Take 8 mg by mouth as needed for nausea or vomiting.     Rimegepant Sulfate  (NURTEC) 75 MG TBDP Take 1 tablet by mouth as needed.     topiramate  (TOPAMAX ) 50 MG tablet Take 1 tablet (50 mg total) by mouth daily. May also take 1 tablet (50 mg total) daily as needed (daily during menstrual cycle). 180 tablet 3   traZODone  (DESYREL ) 100 MG tablet Take 1 tablet (100 mg total) by mouth at bedtime. 90 tablet 0    Ubrogepant  (UBRELVY ) 100 MG TABS Take 1 tablet by mouth as needed.     vitamin B-12 (CYANOCOBALAMIN ) 500 MCG tablet Take 500 mcg by mouth daily.     No current facility-administered medications for this visit.    Allergies as of 04/03/2024 - Review Complete 04/03/2024  Allergen Reaction Noted   Advil [ibuprofen] Dermatitis 03/31/2013   Nsaids Dermatitis 03/31/2013   Sulfa antibiotics Swelling 11/26/2012    Vitals: BP 110/71   Pulse 83   Ht 5' 7 (1.702 m)   Wt 156 lb (70.8 kg)   BMI 24.43 kg/m  Last Weight:  Wt Readings from Last 1 Encounters:  04/03/24 156 lb (70.8 kg)   Last Height:   Ht Readings from Last 1 Encounters:  04/03/24 5' 7 (1.702 m)   Physical exam: Exam: Gen: NAD, conversant, well nourised, well groomed                     CV: RRR, no MRG. No Carotid Bruits. No peripheral edema, warm, nontender Eyes: Conjunctivae clear without exudates or hemorrhage  Neuro: Detailed Neurologic Exam  Speech:    Speech is normal; fluent and spontaneous with normal comprehension.  Cognition:    The Faith Jordan is oriented to person, place, and time;     recent and remote memory intact;     language fluent;     normal attention, concentration,     fund of knowledge Cranial Nerves:    The pupils are equal, round, and reactive to light. The fundi are flat. Visual fields are full to finger confrontation. Extraocular movements are intact. Trigeminal sensation is intact and the muscles of mastication are normal. The face is symmetric. The palate elevates in the midline. Hearing intact. Voice is normal. Shoulder shrug is normal. The tongue has normal motion without fasciculations.   Coordination: No dysmetria or ataxia  Gait: Foot inversion  Motor Observation:    No asymmetry, no atrophy, and no involuntary movements noted. Tone: Increased distal left leg  Posture:    Posture is normal. normal erect    Strength: improved  Left distal UE 4+/5 Mild left hand grip  weakness Left arm prox 4/5 Left hip flexion 4/5 Left Distal LE 4+  Strength is V/V in the right upper and right lower limbs.  Sensation: intact to LT     Reflex Exam:  DTR's:    Deep tendon reflexes in the upper and lower extremities are brisker on the left upper and lowers.   Toes:    The toes are equiv bilaterally.   Clonus:    Persistent clonus left AJ with ncreased tone (decreased ROM left ankle) and foot inversion      Assessment/Plan:  This is an extremely lovely young lady with a history of hemiplegic migraine, angioedema, dysautonomia and asthma here with her mother who also has hemiplegic migraines.  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  She presented on January 07, 2024 for left-sided weakness in the face and left arm and leg without headache; none of her prior hemiplegic migraines in the last year are associated with limb weakness.  She went to bed asymptomatic the night prior and woke up at 11:30 PM and could not use her left side since she had a history of hemiplegic migraine she took her migraine medication and went back to sleep.  When she woke up in the morning she still could not use her left side and took more medications without improvement which prompted her to seek emergency room care unfortunately she was approximately 20 hours out from the start of symptoms when seen in the emergency room.  A code stroke was activated, NIH stroke scale was 6 which included a 1 for facial palsy, 3 for motor left arm, 1 for motor left leg, 1 for dysarthria, CT of the head without acute abnormality and CTA were obtained which showed no large vessel occlusion, MRI demonstrated an infarct in the right basal ganglia.  She was loaded with Plavix  in the emergency room.  MRI of the brain showed a right 10 mm infarct in the posterior limb of the right internal capsule.  MRI of the brain also showed calcifications in the  globus pallidus bilaterally unclear etiology otherwise basal ganglia were within normal limits.  Calcifications are of unknown etiology, and unknown if played any role in the stroke.  Had extensive stroke workup including TEE, TTE, lower extremity venous Doppler, TCD bubble study which were all negative, LDL was 106, A1c was 4.9, UDS negative, she was put on dual antiplatelet therapy and statin, currently there is still question regarding the etiology for her stroke.  Of particular note she had been using estrogen for OCT, denied smoking alcohol or illicit drug use.  A workup for autoimmune disorders, vasculitic disorder/hypercoagulable etiologies were also negative.  - Decrease baclofen  try 5mg  instead of 10, causing sedation, for spasticity - Left persistent clonus AJ, increase distal lower extremity tone, foot inversion, affecting her quality of life, cannot take baclofen  or other oral medications due to sedation, discussed with Dr. 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 - Faith Jordan is to abstain from estrogen containing birth control, discussed there is increased risk for stroke in women with migraine with aura and a contraindication for the combined contraceptive pill for use by women who have migraine with aura. The risk for women with migraine without aura is lower. However other risk  factors like smoking are far more likely to increase stroke risk than migraine. There is a recommendation for no smoking and for the use of OCPs without estrogen such as progestogen only pills particularly for women with migraine with aura.Aaron Aas People who have migraine headaches with auras may be 3 times more likely to have a stroke caused by a blood clot, compared to migraine patients who don't see auras. Women who take hormone-replacement therapy may be 30 percent more likely to suffer a clot-based stroke than women not taking medication containing estrogen. Other risk factors like smoking and high blood pressure may be  much more important.  - Despite the stroke being in an area more consistent with lacunar infarct, given her age I think we would be remiss if we did not further follow-up with a 30-day heart monitor and possible loop recorder especially since she has a family history in her father of early onset A-fib in his 71s. Seeing [facility] for another opinion.  - Continue Emgality .  Faith Jordan states she has 8 migraine days a month without aura and without medication overuse and greater than 15 headache days a month, we will try and get Botox  for migraine approval concurrently with Emgality .  At this time managing her migraines will be very important to prevent further events. - Triptans contraindicated - 30-day cardiac monitor and will see what [facility] reveals (loop recorder?) - [facility] for etiology of stroke and basal ganglia calcifications - next week - Continue Plavix  (has an allergy to NSAIDs cannot use aspirin) - HDL low, discussed recommendations - continue PT/OT - Baclofen  and Zavzpret  for acute migraine management, triptans contraindicated - B12 deficiency 4 years ago 152 last 280 recommend continued supplementation orally daily - LDL inpatient was 106 statin was started due to recommendation less than 70   Meds ordered this encounter  Medications   modafinil  (PROVIGIL) 100 MG tablet    Sig: Take 0.5-1 tablets (50-100 mg total) by mouth daily.    Dispense:  30 tablet    Refill:  5    Cc: Gabriel John, NP,  Gabriel John, NP  Aldona Amel, MD  [facility] [address] [place], [place] [address]  Phone [phone] Fax [phone]  I spent over 50 minutes of face-to-face and non-face-to-face time with Faith Jordan on the  1. Familial hemiplegic migraine   2. Stroke of right basal ganglia (HCC)   3. 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 (CVA)     diagnosis.  This included previsit chart review, lab review, study review, order entry, electronic health record documentation, Faith Jordan education on the different diagnostic and therapeutic options, counseling and coordination of care, risks and benefits of management, compliance, or risk factor reduction

## 2024-04-03 NOTE — Patient Instructions (Addendum)
 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 These regions, including the neocortex, hippocampus, and hypothalamus, are involved in sleep regulation, suggesting a direct link between SCN1A and sleep patterns.   Discuss botox  wit Dr. Maryalice Smaller and also Dr. 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 MODAFINIL (moe DAF i nil) treats sleep disorders, such as narcolepsy, obstructive sleep apnea, and shift work disorder. It works by promoting wakefulness. It belongs to a group of medications called stimulants. This medicine may be used for other purposes; ask your health care provider or pharmacist if you have questions. COMMON BRAND NAME(S): Provigil What should I tell my care team before I take this medication? They need to know if you have any of these conditions: Kidney disease Liver disease Mental health conditions An unusual or allergic reaction to modafinil, other medications, foods, dyes, or preservatives Pregnant or trying to get pregnant Breast-feeding How should I use this medication? Take this medication by mouth with water. Take it as directed on the prescription label at the same time every day. You can take it with or without food. If it upsets your stomach, take it with food. Keep taking it unless your care team tells you to stop. A special MedGuide will be given to you by the pharmacist with each prescription and refill. Be sure to read this information carefully each time. Talk to your care team about the use of this medication in children. Special care may be needed. Overdosage: If you think you have taken too much of this medicine contact a poison control center  or emergency room at once. NOTE: This medicine is only for you. Do not share this medicine with others. What if I miss a dose? If you miss a dose, take it as soon as you can. If it is almost time for your next dose, take only that dose. Do not take double or extra doses. What may interact with this medication? Do not take this medication with any of the following: Amphetamine or dextroamphetamine Dexmethylphenidate or methylphenidate MAOIs, such as Marplan, Nardil, and Parnate Pemoline Procarbazine This medication may also interact with the following: Antifungal medications, such as itraconazole or ketoconazole Barbiturates, such as phenobarbital Carbamazepine Cyclosporine Diazepam  Estrogen or progestin hormones Medications for mental health conditions Phenytoin Propranolol  Triazolam Warfarin This list may not describe all possible interactions. Give your health care provider a list of all the medicines, herbs, non-prescription drugs, or dietary supplements you use. Also tell them if you smoke, drink alcohol, or use illegal drugs. Some items may interact with your medicine. What should I watch for while using this medication? Visit your care team for regular checks on your progress. It may be some time before you see the benefit from this medication. This medication may affect your coordination, reaction time, or judgment. Do not drive or operate machinery until you know how this medication affects you. Sit up or stand slowly to reduce the risk of dizzy or fainting spells. Drinking alcohol with this medication can increase the risk of these side effects. This medication may cause serious skin reactions. They can happen weeks to months after starting the medication. Contact your care team right away if you notice  fevers or flu-like symptoms with a rash. The rash may be red or purple and then turn into blisters or peeling of the skin. You may also notice a red rash with swelling of the face,  lips, or lymph nodes in your neck or under your arms. Estrogen and progestin hormones may not work as well while you are taking this medication. Your care team can help you find the contraceptive option that works for you. It is unknown if the effects of this medication will be increased by the use of caffeine . Caffeine  is found in many foods, beverages, and medications. Ask your care team if you should limit or change your intake of caffeine -containing products while on this medication. What side effects may I notice from receiving this medication? Side effects that you should report to your care team as soon as possible: Allergic reactions or angioedema--skin rash, itching or hives, swelling of the face, eyes, lips, tongue, arms, or legs, trouble swallowing or breathing Increase in blood pressure Mood and behavior changes--anxiety, nervousness, confusion, hallucinations, irritability, hostility, thoughts of suicide or self-harm, worsening mood, feelings of depression Rash, fever, and swollen lymph nodes Redness, blistering, peeling, or loosening of the skin, including inside the mouth Side effects that usually do not require medical attention (report to your care team if they continue or are bothersome): Anxiety, nervousness Dizziness Headache Nausea Trouble sleeping This list may not describe all possible side effects. Call your doctor for medical advice about side effects. You may report side effects to FDA at 1-800-FDA-1088. Where should I keep my medication? Keep out of the reach of children and pets. This medication can be abused. Keep it in a safe place to protect it from theft. Do not share it with anyone. It is only for you. Selling or giving away this medication is dangerous and against the law. Store at room temperature between 20 and 25 degrees C (68 and 77 degrees F). Get rid of any unused medication after the expiration date. This medication may cause harm and death if it is taken  by other adults, children, or pets. It is important to get rid of the medication as soon as you no longer need it or it is expired. You can do this in two ways: Take the medication to a medication take-back program. Check with your pharmacy or law enforcement to find a location. If you cannot return the medication, check the label or package insert to see if the medication should be thrown out in the garbage or flushed down the toilet. If you are not sure, ask your care team. If it is safe to put it in the trash, take the medication out of the container. Mix the medication with cat litter, dirt, coffee grounds, or other unwanted substance. Seal the mixture in a bag or container. Put it in the trash. NOTE: This sheet is a summary. It may not cover all possible information. If you have questions about this medicine, talk to your doctor, pharmacist, or health care provider.  2024 Elsevier/Gold Standard (2022-11-12 00:00:00)

## 2024-04-05 ENCOUNTER — Telehealth: Payer: Self-pay | Admitting: Neurology

## 2024-04-05 ENCOUNTER — Ambulatory Visit: Admitting: Occupational Therapy

## 2024-04-05 ENCOUNTER — Ambulatory Visit: Admitting: Physical Therapy

## 2024-04-05 ENCOUNTER — Telehealth: Payer: Self-pay | Admitting: *Deleted

## 2024-04-05 DIAGNOSIS — I6381 Other cerebral infarction due to occlusion or stenosis of small artery: Secondary | ICD-10-CM | POA: Diagnosis not present

## 2024-04-05 DIAGNOSIS — R29818 Other symptoms and signs involving the nervous system: Secondary | ICD-10-CM

## 2024-04-05 DIAGNOSIS — R29898 Other symptoms and signs involving the musculoskeletal system: Secondary | ICD-10-CM | POA: Diagnosis not present

## 2024-04-05 DIAGNOSIS — I69354 Hemiplegia and hemiparesis following cerebral infarction affecting left non-dominant side: Secondary | ICD-10-CM | POA: Diagnosis not present

## 2024-04-05 DIAGNOSIS — R2681 Unsteadiness on feet: Secondary | ICD-10-CM

## 2024-04-05 DIAGNOSIS — R278 Other lack of coordination: Secondary | ICD-10-CM | POA: Diagnosis not present

## 2024-04-05 DIAGNOSIS — M6281 Muscle weakness (generalized): Secondary | ICD-10-CM

## 2024-04-05 DIAGNOSIS — M5459 Other low back pain: Secondary | ICD-10-CM

## 2024-04-05 DIAGNOSIS — R41841 Cognitive communication deficit: Secondary | ICD-10-CM | POA: Diagnosis not present

## 2024-04-05 DIAGNOSIS — R2689 Other abnormalities of gait and mobility: Secondary | ICD-10-CM | POA: Diagnosis not present

## 2024-04-05 DIAGNOSIS — R252 Cramp and spasm: Secondary | ICD-10-CM | POA: Insufficient documentation

## 2024-04-05 NOTE — Therapy (Signed)
 OUTPATIENT OCCUPATIONAL THERAPY NEURO TREATMENT AND PROGRESS NOTE  Patient Name: Faith Jordan MRN: [id number] DOB:28-Jan-1996, 28 y.o., female Today's Date: 04/05/2024  PCP: Gabriel John, NP  REFERRING PROVIDER: Genetta Kenning, MD   END OF SESSION:  OT End of Session - 04/05/24 1419     Visit Number 14    Number of Visits 25   including eval   Date for OT Re-Evaluation 05/25/24    Authorization Type [employer] [facility]) 2025    OT Start Time 1406    OT Stop Time 1445    OT Time Calculation (min) 39 min    Activity Tolerance Patient tolerated treatment well    Behavior During Therapy [facility] for tasks assessed/performed          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   Acute flank pain 06/02/2022   Angio-edema 04/27/2016   Angioedema    Aquagenic angio-edema-urticaria    Asthma    no problems /not used recently   Dysautonomia (HCC)    Dysrhythmia    sinus tachycardia   Fibroid    right breast, adenoma   Headache(784.0)    History of COVID-19 11/21/2020   Hives 12/22/2015   Pneumonia    hx  6th grade   Sensation of fullness in both ears 02/02/2023   Snoring 02/11/2022   Urinary frequency 02/12/2019   Vaginal yeast infection 06/02/2022   Vasculitis (HCC)    Past Surgical History:  Procedure Laterality Date   ADENOIDECTOMY     BREAST LUMPECTOMY Right    BREAST LUMPECTOMY Right    MASS EXCISION Right 10/07/2014   Procedure: EXCISION OF RIGHT BREAST MASS;  Surgeon: Adalberto Hollow, MD;  Location: [facility];  Service: General;  Laterality: Right;   TONSILLECTOMY AND ADENOIDECTOMY     TOOTH EXTRACTION     TRANSESOPHAGEAL ECHOCARDIOGRAM (CATH LAB) N/A 01/10/2024   Procedure: TRANSESOPHAGEAL ECHOCARDIOGRAM;  Surgeon: Hugh Madura, MD;  Location: [facility] INVASIVE CV LAB;  Service: Cardiovascular;  Laterality: N/A;   Patient Active Problem List   Diagnosis Date Noted   Spasticity as late effect of cerebrovascular  accident (CVA) 04/05/2024   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) 02/10/2024   Coping style affecting medical condition 01/19/2024   History of CVA (cerebrovascular accident) 01/12/2024   Stroke of right basal ganglia (HCC) 01/07/2024   Non-restorative sleep 05/26/2022   Sleep paralysis, 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   Vivid dream 05/26/2022   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   Excessive daytime sleepiness 05/26/2022   Sleep disturbance 03/17/2022   Panic attacks 02/11/2022   Vitamin B12 deficiency 07/30/2021   Vitamin D  deficiency 07/30/2021   Family history of hypothyroidism 07/30/2021   GAD (generalized anxiety disorder) 03/18/2020   Fatigue 11/06/2019   Chronic back pain 07/06/2019   Preventative health care 04/27/2018   Migraines 04/25/2018   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   Anemia 01/04/2018   Headache disorder 12/29/2015   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   Sinus tachycardia 12/22/2015   Dysautonomia (HCC) 12/22/2015   Dysautonomia, familial (HCC) 04/22/2015   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   Neurocardiogenic syncope 04/06/2013   Intermittent palpitations 04/06/2013    ONSET DATE: 01/26/2024 (referral date)  REFERRING DIAG: I63.81 (ICD-10-CM) - Other cerebral infarction due to occlusion or stenosis of small artery   THERAPY DIAG:  Muscle weakness (generalized)  Other lack of coordination  Other symptoms and signs involving  the nervous system  Other symptoms and signs involving the musculoskeletal system  Hemiplegia and hemiparesis following cerebral infarction affecting left non-dominant side (HCC)  Cerebrovascular accident (CVA) of right basal ganglia (HCC)  Stroke of right basal ganglia (HCC)  Rationale for Evaluation and Treatment: Rehabilitation  SUBJECTIVE:   SUBJECTIVE STATEMENT: Pronunciation: Bree-Anne-ah  She doesn't even feel that she really needs to tape her thumb  anymore.   Pt accompanied by: self   PERTINENT HISTORY: familiar hemiplegic migraine, angioedema 2017,dysautonomia; infarct right internal capsule/ R basal ganglia stroke   MRI of brain on 01/07/24   IMPRESSION: 1. 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 2. Susceptibility related calcifications in the globus pallidus bilaterally. This likely reflects the sequela of chronic microvascular ischemia.  PRECAUTIONS: Fall, Per 02/10/24 MD Office Visit: Pt may gradually begin to return to driving.   WEIGHT BEARING RESTRICTIONS: No  PAIN:  Are you having pain? No  FALLS: Has patient fallen in last 6 months? Yes when CVA event occurred.  03/05/24 - Pt reported recent fall after trying to get clothes out of dryer. Pt denied injuries and able to ind get up after fall.   LIVING ENVIRONMENT: Lives with: lives with their family Lives in: House/apartment Stairs: Yes: External: 4 steps; bilateral but cannot reach both Has following equipment at home: shower chair  PLOF: Independent, typically living ind, worked as Engineer, civil (consulting)  PATIENT GOALS: I want to get my hand and arm back and be able to go to work.  OBJECTIVE:  Note: Objective measures were completed at Evaluation unless otherwise noted.  HAND DOMINANCE: Right  ADLs: Overall ADLs:  Transfers/ambulation related to ADLs: Eating: ind Grooming: difficulty with completing hair styling e.g. putting hair up in ponytail, difficulty with curling hair. Uses RUE to complete toothbrushing and brushing hair.  Difficulty with clipping fingernails. UB Dressing: ind, extra time. Buttons and zippers difficult.  LB Dressing: Ind, difficulty with pulling up pants and buttoning buttons on jeans, extra time for tying shoes Toileting: ind  Bathing: difficulty washing hair d/t unable to apply pressure while moving LUE, typically uses RUE to ensure thoroughness, shower bench is available PRN Tub Shower transfers:  walk-in shower, pt reported SOB after showering or when walking a lot, sometimes requires rest breaks Equipment: Shower seat with back and Walk in shower  IADLs: Shopping: assistance from parents but pt participates, such as trying to reach for items on shelf and pushing cart Light housekeeping: pt completes dishwashing, has not attempted laundry Meal Prep: pt reported cooking but task is tiring Community mobility: has attempted to drive 1x with parent  Handwriting: No change per pt.   Work activities: Pt expressed concerns with FM coordination and concerns about return to work considerations, such as coordination required for drawing up syringes from a vial for medication administration, completing IVs, typing, navigating phone.   MOBILITY STATUS: Independent  POSTURE COMMENTS:  Ind sitting balance, sometimes forward shoulder posture  ACTIVITY TOLERANCE: Activity tolerance: Pt reported I'm doing more than I was doing in the hospital, but I definitely get tired easier and take more breaks.  FUNCTIONAL OUTCOME MEASURES: PSF: 3.0  03/15/24 - PSFS: 7.0    UPPER EXTREMITY ROM:    Active ROM Right eval Left eval  Shoulder flexion [facility]  Shoulder abduction  [facility]  Shoulder adduction    Shoulder extension    Shoulder internal rotation    Shoulder external rotation    Elbow flexion  WFL -  slightly delayed movements  Elbow extension    Wrist flexion    Wrist extension    Wrist ulnar deviation    Wrist radial deviation    Wrist pronation    Wrist supination    (Blank rows = not tested)  OT palpated pt's L shoulder and noted tightness of muscles at L shoulder.   UPPER EXTREMITY MMT:     MMT Right eval Left eval  Shoulder flexion 5 3+   HAND FUNCTION: Grip strength: Right: 48.9, 53.5, 53.3 (51.9 lbs average) lbs; Left: 24.2, 22.2, 22.7 (23.1 lbs average) lbs  COORDINATION: 9 Hole Peg test: Right: 20 sec; Left: 40 sec  Pt reported practicing picking up pills,  sorting change, and playing connect-4 at home.  SENSATION: Pt reported tingling in L thumb which has since resolves. Pt questioned if symptoms might have been from resting hand splint at night. Pt reported no other symptoms of numbness/tingling.  EDEMA: None noted  MUSCLE TONE: RUE: Within functional limits and LUE: slight ataxia  COGNITION: Overall cognitive status: Within functional limits for tasks assessed  Pt reported some difficulty with word-finding.  Pt reported sometimes asking same question again a few hours apart. Pt's mother reported not noticing this recently.  VISION: Subjective report: Pt reported initial changes to vision (unable to read, blurry vision close-up and far away) though okay now.  Baseline vision: No visual deficits Visual history: none noted  VISION ASSESSMENT: Pt accurately clock on wall and reported reading books on Kindle. Pt reported still using large print on phone since hospital.   Pt tracked easily to all 4 quadrants. Peripheral vision - WFL B sides, ?slightly decreased on L side though functional regardless  Patient has difficulty with following activities due to following visual impairments: None noted  PERCEPTION: Not tested  PRAXIS: Not tested  OBSERVATIONS: Pt ambulated ind. Pt soft-spoken and appeared tired during session. Pt was pleasant and appeared motivated to complete LUE functional tasks at home based on pt report.                                                                                                                             TODAY'S TREATMENT:    - Therapeutic exercises completed for duration as noted below including: Wrist flex and ext with tan (12.5 lbs) flex bar x 2 min for strength and endurance of affected extremity  Supination with tan (12.5 lbs) flex bar x 2 min for strength and endurance of affected extremity  Pronation with tan (12.5 lbs) flex bar x 2 min for strength and endurance of affected  extremity  Pt completed supination and pronation roll with tan (12.5 lbs) flex bar in LUE x 2 min for ROM and endurance of affected extremity   Pt held tan (12.5 lbs) flexbar in affected left hand to hit side of table x 1 min for proprioceptive input as needed to tolerate impact and force through this extremity in addition to grip  strength to maintain hold of item.   Pt participated in game of Operation Perfection mash+ups x 5 trials with easy, medium, and hard puzzle cards for fine motor coordination, pinch strength, and item recognition. Pt placed pieces using affected left hand and grabbing pieces on the outside. Pt was able to complete easy, medium, and hard puzzles in under 60 seconds.  Pt then attempted pick up of items with use of tweezers for additional challenge to pinch strength at the hard level of difficulty. Pt required increased time and use of larger tweezers with L hand.   PATIENT EDUCATION: Education details: functional activities; tolerance to force and impact; endurance; grip Person educated: Patient Education method: Explanation, Demonstration, and Verbal cues Education comprehension: verbalized understanding, returned demonstration, verbal cues required, and needs further education  HOME EXERCISE PROGRAM: 02/09/24 - Affected UE ROM and Yellow Theraputty. Access Code: [email or web address]. 02/15/2024: L handed typing words 02/21/24 - Digit ext with theraputty or rubber band (access code: [email or web address]). Functional reach: Handwritten/Verbal instructions: 1 set in front of mirror, 1 set without mirror: Reaching overhead to touch top of head with affected hand, reaching overhead to touch back of head with affected hand, reaching arm forward and touching nose with affected UE - 10 reps, 2 sets, 2x per day. 03/07/2024: Theraband; dowel 03/13/24 - graded up to pink theraputty. Reps/sets adjusted: Access Code: [email or web address]. 03/15/24 - Verbal instructions: Daily typing practice. LUE overhead reaching - Access  Code: [id number]  GOALS: Goals reviewed with patient? Yes  SHORT TERM GOALS: Target date: 03/09/24  Pt will ind demo understanding of HEP using visual handouts. Baseline: new to outpt OT Goal status: MET  2.  Pt will ind recall at least 3 energy conservation strategies. Baseline: Activity tolerance: Pt reported I'm doing more than I was doing in the hospital, but I definitely get tired easier and take more breaks.  03/13/24 - Pt ind recalled breathing, spacing out activities throughout the day, naps, taking breaks PRN.  Goal status: MET  3.  Pt and/or family will return demo of personal e-stim unit use and electrode placement for affected UE.  Baseline: pt reported she has ordered a personal e-stim unit.  02/09/24 - Education and handout provided. Pt returned demo of using personal e-stim unit for wrist flex/ext of affected UE. Goal status: MET  4.  Pt will report improved ability to use LUE during overhead reaching tasks with minimal use of RUE to compensate, such as hair care tasks and washing hair. Baseline: pt reported difficulty with hair styling (e.g. putting hair in ponytail, curling hair) 03/13/24 - Pt reported continued difficulty with putting hair in a bun and washing hair. Goal status: in progress  LONG TERM GOALS: Target date: 05/25/2024   Patient will report at least two-point increase in average PSFS score or at least three-point increase in a single activity score indicating functionally significant improvement given minimum detectable change using adaptive strategies and A/E PRN. Baseline: PSF: 3.0 total score (See above for individual activity scores)  03/15/24 - PSFS: 7.0 Goal status: MET and ongoing  2.  Patient will demonstrate at least  37 lbs LUE grip strength as needed to open jars and other containers.  Baseline: Grip strength: Right: 48.9, 53.5, 53.3 (51.9 lbs average) lbs; Left: 24.2, 22.2, 22.7 (23.1 lbs average) lbs 03/07/2024: LUE - 33.1 lbs Goal status: MET  on 03/07/24 and revised on 03/13/24  3.  Patient will demo improved FM coordination as evidenced by completing nine-hole peg with use of LUE  in 30 seconds or less.  Baseline: 9 Hole Peg test: Right: 20 sec; Left: 40 sec 03/07/2024: 34 sec Goal status: in progress  4.  Pt will verbalize understanding of A/E, adaptive strategies for functional tasks as needed for return to work considerations. Baseline: Work activities: Pt expressed concerns with FM coordination and concerns about return to work considerations, such as coordination required for drawing up syringes from a vial for medication administration, completing IVs, typing, navigating phone. Goal status: in progress  ASSESSMENT:  CLINICAL IMPRESSION: This 14th progress note is for dates: 02/07/2024 to 04/05/2024. Pt has met 3/4 STGs and 2/4 LTGs. Pt making progress towards goals as expected and continues to benefit from skilled OT services in the outpatient setting to work towards remaining goals or until max rehab potential is met. Patient demonstrating good tolerance to functional activities this date. Use of external stressor with auditory timer to help simulate work task completion under stress. Will extend POC to allow pt to reach max rehab potential.   PERFORMANCE DEFICITS: in functional skills including ADLs, IADLs, coordination, dexterity, proprioception, sensation, tone, ROM, strength, pain, muscle spasms, flexibility, Fine motor control, Gross motor control, mobility, balance, endurance, and UE functional use, cognitive skills including attention and energy/drive, and psychosocial skills including environmental adaptation.   IMPAIRMENTS: are limiting patient from ADLs, IADLs, rest and sleep, work, leisure, and social participation.   CO-MORBIDITIES: may have co-morbidities  that affects occupational performance. Patient will benefit from skilled OT to address above impairments and improve overall function.  REHAB POTENTIAL:  Good  PLAN:  OT FREQUENCY: Additional 1-2x/week  OT DURATION: Additional  6 weeks   PLANNED INTERVENTIONS: 97168 OT Re-evaluation, 97535 self care/ADL training, 13244 therapeutic exercise, 97530 therapeutic activity, 97112 neuromuscular re-education, 97140 manual therapy, 97035 ultrasound, 97018 paraffin, 01027 fluidotherapy, 97010 moist heat, 97010 cryotherapy, 97760 Orthotic Initial, 97761 Prosthetic Initial, 97763 Orthotic/Prosthetic subsequent, passive range of motion, functional mobility training, visual/perceptual remediation/compensation, energy conservation, patient/family education, and DME and/or AE instructions  RECOMMENDED OTHER SERVICES: N/A for this visit  CONSULTED AND AGREED WITH PLAN OF CARE: Patient   PLAN FOR NEXT SESSION: work sim - squigs  Review theraband and dowel HEPs PRN  Functional use of LUE: how is typing going? Hair styling?  Review ROM and theraputty HEP PRN FM coordination and functional reaching activities - e.g. Blaze Pods, small pegs in-hand manipulation, thumb ROM Overhead strengthening - e.g. Squigz at [employer], Pegs at upright mirror, Ball at wall [employer])   UBE/SciFit for endurance  Altamease Asters, OT 04/05/2024, 6:29 PM

## 2024-04-05 NOTE — Telephone Encounter (Signed)
-----   Message from Glory Larsen sent at 04/05/2024  3:09 PM EDT ----- Regarding: botox  for spasticity Dr. Gracie Lav has agreed to perform botox  for patient's leg lower extremity spasticity. Please start approval process(see my last note). Spasticity due to stroke. 300U xeomin to start. When approved, schedule her for appointment with dr Gracie Lav thank you

## 2024-04-05 NOTE — Therapy (Signed)
 OUTPATIENT PHYSICAL THERAPY NEURO TREATMENT    Patient Name: Faith Jordan MRN: [id number] DOB:1996-05-28, 28 y.o., female Today's Date: 04/05/2024   PCP: Gretta Comer POUR, NP REFERRING PROVIDER: Carilyn Prentice BRAVO, MD   END OF SESSION:  PT End of Session - 04/05/24 1450     Visit Number 19    Number of Visits 25   recert   Date for PT Re-Evaluation 05/10/24   recert, to allow for scheduling delays   Authorization Type [employer]    PT Start Time 1448    PT Stop Time 1529    PT Time Calculation (min) 41 min    Activity Tolerance Patient tolerated treatment well    Behavior During Therapy [facility] for tasks assessed/performed               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   Acute flank pain 06/02/2022   Angio-edema 04/27/2016   Angioedema    Aquagenic angio-edema-urticaria    Asthma    no problems /not used recently   Dysautonomia (HCC)    Dysrhythmia    sinus tachycardia   Fibroid    right breast, adenoma   Headache(784.0)    History of COVID-19 11/21/2020   Hives 12/22/2015   Pneumonia    hx  6th grade   Sensation of fullness in both ears 02/02/2023   Snoring 02/11/2022   Urinary frequency 02/12/2019   Vaginal yeast infection 06/02/2022   Vasculitis (HCC)    Past Surgical History:  Procedure Laterality Date   ADENOIDECTOMY     BREAST LUMPECTOMY Right    BREAST LUMPECTOMY Right    MASS EXCISION Right 10/07/2014   Procedure: EXCISION OF RIGHT BREAST MASS;  Surgeon: Krystal Russell, MD;  Location: [facility] OR;  Service: General;  Laterality: Right;   TONSILLECTOMY AND ADENOIDECTOMY     TOOTH EXTRACTION     TRANSESOPHAGEAL ECHOCARDIOGRAM (CATH LAB) N/A 01/10/2024   Procedure: TRANSESOPHAGEAL ECHOCARDIOGRAM;  Surgeon: Jeffrie Oneil BROCKS, MD;  Location: [facility] INVASIVE CV LAB;  Service: Cardiovascular;  Laterality: N/A;   Patient Active Problem List   Diagnosis Date Noted   Spasticity as late effect of  cerebrovascular accident (CVA) 04/05/2024   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) 02/10/2024   Coping style affecting medical condition 01/19/2024   History of CVA (cerebrovascular accident) 01/12/2024   Stroke of right basal ganglia (HCC) 01/07/2024   Non-restorative sleep 05/26/2022   Sleep paralysis, 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   Vivid dream 05/26/2022   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   Excessive daytime sleepiness 05/26/2022   Sleep disturbance 03/17/2022   Panic attacks 02/11/2022   Vitamin B12 deficiency 07/30/2021   Vitamin D  deficiency 07/30/2021   Family history of hypothyroidism 07/30/2021   GAD (generalized anxiety disorder) 03/18/2020   Fatigue 11/06/2019   Chronic back pain 07/06/2019   Preventative health care 04/27/2018   Migraines 04/25/2018   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   Anemia 01/04/2018   Headache disorder 12/29/2015   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   Sinus tachycardia 12/22/2015   Dysautonomia (HCC) 12/22/2015   Dysautonomia, familial (HCC) 04/22/2015   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   Neurocardiogenic syncope 04/06/2013   Intermittent palpitations 04/06/2013    ONSET DATE: 01/26/2024 (referral)   REFERRING DIAG:  I63.81 (ICD-10-CM) - Other cerebral infarction due to occlusion or stenosis of small artery    THERAPY DIAG:  Muscle weakness (generalized)  Unsteadiness on feet  Other low back pain  Rationale for Evaluation and Treatment: Rehabilitation  SUBJECTIVE:                                                                                                                                                                                             SUBJECTIVE STATEMENT: Pt reports she is sore following her massage. States she cannot walk more than 15 minutes, feels like her leg stops working and she is all over the place. No falls.     Pt accompanied by:  self  PERTINENT HISTORY: familiar hemiplegic migraine, angioedema 2017,dysautonomia; infarct right internal capsule/ R basal ganglia stroke  PAIN:  Are you having pain? No    PRECAUTIONS: Fall  RED FLAGS: None   WEIGHT BEARING RESTRICTIONS: No  FALLS: Has patient fallen in last 6 months? No  LIVING ENVIRONMENT: Lives with: lives with their family Lives in: House/apartment Stairs: Yes: External: 4 steps; bilateral but cannot reach both Has following equipment at home: shower chair  PLOF: Independent  PATIENT GOALS: Being normal again. Being able to go back to work and be independent   OBJECTIVE:  Note: Objective measures were completed at Evaluation unless otherwise noted.  DIAGNOSTIC FINDINGS: MRI of brain on 01/07/24  IMPRESSION: 1. 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 2. Susceptibility related calcifications in the globus pallidus bilaterally. This likely reflects the sequela of chronic microvascular ischemia.  COGNITION: Overall cognitive status: Within functional limits for tasks assessed   SENSATION: Pt denies numbness/tingling in L hemibody   MUSCLE TONE: Non-fatiguing clonus of LLE  POSTURE: No Significant postural limitations  LOWER EXTREMITY ROM:     Active  Right Eval Left Eval  Hip flexion    Hip extension    Hip abduction    Hip adduction    Hip internal rotation    Hip external rotation    Knee flexion    Knee extension    Ankle dorsiflexion    Ankle plantarflexion    Ankle inversion    Ankle eversion     (Blank rows = not tested)  LOWER EXTREMITY MMT:  Tested in seated position   MMT Right Eval Left Eval  Hip flexion 5 4+  Hip extension    Hip abduction 5 5  Hip adduction 5 5  Hip internal rotation    Hip external rotation    Knee flexion 5 5  Knee extension 5 4+  Ankle dorsiflexion 5 5  Ankle plantarflexion    Ankle inversion    Ankle eversion    (Blank rows =  not  tested)  BED MOBILITY:  Not tested  TRANSFERS: Sit to stand: Complete Independence  Assistive device utilized: None     Stand to sit: Complete Independence  Assistive device utilized: None      RAMP:  Not tested  CURB:  Not tested  GAIT: Gait pattern: step through pattern, decreased arm swing- Left, decreased step length- Right, decreased stance time- Left, decreased stride length, decreased hip/knee flexion- Left, lateral hip instability, and wide BOS Distance walked: Various clinic distances  Assistive device utilized: None Level of assistance: Modified independence Comments: Pt walks very guarded, as if she feels unstable. No instability noted other than single instance of L knee buckling which pt able to self-correct.    PATIENT SURVEYS:  ABC scale to be obtained    VITALS  There were no vitals filed for this visit.                                                                                                                              TREATMENT:   Self-care  Discussed recent appointment w/Dr. Ines and pt reports Dr. Ines is wanting her to get botox  in her LLE and reduce her Baclofen  due to fatigue. Pt also started new medication that is supposed to help her stay awake, which she took this morning, but has not noticed a difference.  Per OT, pt is having difficulty in quadruped position due to impaired sensation in LUE. Reviewed quadruped bird dogs and pt able to army crawl on mat table well and reach to ground to pick up objects w/LUE. Pt reports she does not feel like she has difficulty w/this, it just feels weird.  Discussed working on improved endurance by adding 1-2 minutes to her walking workouts per week and working on coping strategies when her anxiety kicks in due to her leg feeling weird. Pt reports this is doable. Encouraged pt to walk for longer on days she feels able but stick to increasing her time each week until she can walk for ~30 minutes consistently.      PATIENT EDUCATION: Education details: See above  Person educated: Patient Education method: Medical illustrator Education comprehension: verbalized understanding and returned demonstration  HOME EXERCISE PROGRAM: Access Code: [email or web address] URL: [email or web address] Date: 02/13/2024 Prepared by: Marlon Lanisa Ishler  Exercises - Half Kneeling Lift  - 1 x daily - 7 x weekly - 1 sets - 10 reps - Down Dog with Bent Knees and Heels on Mat  - 1 x daily - 7 x weekly - 2-3 reps - 15-30 seconds hold - Bird Dog  - 1 x daily - 7 x weekly - 3 sets - 10 reps - Lateral Shuffles  - 1 x daily - 7 x weekly - 3 sets - 10 reps - Braided Sidestepping  - 1 x daily - 7 x weekly - 3 sets - 10 reps - Standing Balance Activity: Plyometric Modified Lower Extremity Jumping [place]  - 1  x daily - 7 x weekly - 3 sets - 10 reps - Standing Forward Step Taps with Counter Support  - 1 x daily - 7 x weekly - 3 sets - 10 reps (at bottom of steps with no UE support, red TB around ankles) - Ankle Inversion with Resistance  - 1 x daily - 7 x weekly - 3 sets - 10 reps - Modified Thomas Stretch  - 1 x daily - 7 x weekly - 2-3 sets - 1-2 minutes  hold - Supine Straight Leg Lumbar Rotation Stretch  - 1 x daily - 7 x weekly - 3 sets - 10 reps - Psoas Mobilization with Small Ball  - 1 x daily - 7 x weekly - 3 sets - 2-3 minute hold - Pigeon Pose  - 1 x daily - 7 x weekly - 1-2 sets - 1-2 minutes hold  From CIR Therapist: Also instructed pt in 2 additional exercises but no printed versions available: - tall kneeling to half kneeling - sit to half kneeling and then return to sit. Perform to each side.   Verbally added 02/21/24: -squats at countertop with chair behind you  Verbally added 03/13/24: - eccentric heel taps  - jumping over line fwd/retro/laterally   Verbally added 03/27/24  - Pallof press rotations w/red theraband   GOALS: Goals reviewed with patient? Yes  SHORT TERM GOALS: Target date:  02/28/2024  Pt will improve FGA to 28/30 for decreased fall risk  Baseline: 21/30, 25/30 (5/13) Goal status: IN PROGRESS  2.  HiMat to be assessed at STG check and LTG updated  Baseline: 11/54 (5/13) Goal status: MET  3.  Pt will improve gait velocity to at least 3.4 ft/s for improved gait efficiency and return to PLOF Baseline: 3.0 ft/sec (eval), 3.31 ft/sec (5/13) Goal status: IN PROGRESS  4.  ABC scale to be assessed and LTG updated  Baseline: 70.6% (5/13) Goal status: MET  5.  Pt will demonstrate proper lifting technique of 25# weight from floor for proper body mechanics and safety at work Baseline:  Goal status: MET  LONG TERM GOALS: Target date: 03/13/2024  Pt will improve gait velocity to at least 3.5 ft/s for improved gait efficiency and independence Baseline: 3.0 ft/sec (eval), 3.31 ft/sec (5/13), 3.57 ft/sec (5/29) Goal status: MET  2.  Pt will improve her score on the HiMat to >/= 16/54 to demonstrate improved balance and function Baseline: 11/54 (5/13), 21/54 (5/29) Goal status: MET  3.  Pt will improve her ABC scale score to >/= 80% for improved confidence in her balance. Baseline: 70.6% (5/13), 90% (5/29) Goal status: MET  4.  Pt will be able to carry 10# object in BUEs up/down 4 steps for improved coordination and safety in work environment   Baseline: can only carry w/single UE, can carry with BUE mod I (5/29) Goal status: MET  5.  Pt will perform [place] get up w/5# weight for improved shoulder stability OH, core stability and high level balance  Baseline: CGA to min A to perform (5/29) Goal status: MET  NEW SHORT TERM GOALS:   Target date: 04/05/2024  Pt will improve her score on the HiMat to >/= 26/54 to demonstrate improved balance and function Baseline: 11/54 (5/13), 21/54 (5/29) Goal status: INITIAL  2.  Pt will be able to perform 5 deep squats and return to standing to demonstrate improved function Baseline: 2 squats before unable to stand up  without physical assist Goal status: INITIAL  3.  Manual TUG to be assessed and LTG set Baseline: to be assessed Goal status: MET  4.  Cognitive TUG to be assessed and LTG set Baseline: to be assessed Goal status: MET    NEW LONG TERM GOALS:  Target date: 04/26/2024  Pt will improve gait velocity to at least 3.75 ft/s for improved gait efficiency and independence Baseline: 3.0 ft/sec (eval), 3.31 ft/sec (5/13), 3.57 ft/sec (5/29) Goal status: INITIAL  2.  Pt will improve her score on the HiMat to >/= 31/54 to demonstrate improved balance and function Baseline: 11/54 (5/13), 21/54 (5/29) Goal status: INITIAL  3.  Pt will be able to perform 10 deep squats and return to standing to demonstrate improved function Baseline: 2 squats before unable to stand up without physical assist Goal status: INITIAL  4.  Pt will improve manual TUG to less than or equal to 12 seconds for improved functional mobility and decreased fall risk.  Baseline: 17.59s Goal status: INITIAL  5.  Pt will improve cognitive TUG to less than or equal to 10 seconds for improved functional mobility and decreased fall risk.  Baseline: 12.35s counting backwards by 8 Goal status: INITIAL      ASSESSMENT:  CLINICAL IMPRESSION: Emphasis of skilled PT session on educating pt on how to improve endurance, coping strategies and reviewing quadruped exercises for improved weightbearing on LUE. Pt reports Dr. Ines wants to botox  her LLE and has reduced her baclofen  to assist w/fatigue, but has not noticed a difference in fatigue this date. Pt anxious about returning to work as she feels she cannot walk >15 minutes without her LLE going crazy, so discussed a walking program with pt and ways to cope w/her anxious thoughts. Continue POC.   OBJECTIVE IMPAIRMENTS: Abnormal gait, decreased activity tolerance, decreased balance, decreased coordination, decreased endurance, decreased mobility, decreased strength, increased  muscle spasms, impaired UE functional use, improper body mechanics, and pain  ACTIVITY LIMITATIONS: carrying, lifting, squatting, stairs, reach over head, hygiene/grooming, and locomotion level  PARTICIPATION LIMITATIONS: meal prep, cleaning, laundry, interpersonal relationship, driving, shopping, community activity, occupation, and yard work  PERSONAL FACTORS: 1 comorbidity: R internal capsule infarct/R basal ganglia stroke are also affecting patient's functional outcome.   REHAB POTENTIAL: Excellent  CLINICAL DECISION MAKING: Evolving/moderate complexity  EVALUATION COMPLEXITY: Moderate  PLAN:  PT FREQUENCY: 2-3x/week (starting w/2x following eval)  PT DURATION: 6 weeks  PLANNED INTERVENTIONS: 02835- PT Re-evaluation, 97750- Physical Performance Testing, 97110-Therapeutic exercises, 97530- Therapeutic activity, [id number]- Neuromuscular re-education, 97535- Self Care, [id number]- Manual therapy, [phone]- Gait training, [phone]- Canalith repositioning, [phone]- Aquatic Therapy, Patient/Family education, Balance training, Stair training, Taping, Dry Needling, Joint mobilization, Spinal mobilization, Vestibular training, and DME instructions  PLAN FOR NEXT SESSION: Goals. High level agility, elliptical, L quad eccentric strength, OH tasks. Practice w/plastic needles Marvetta has these) in oranges and pin the needle to targets? Cognitive dual tasking, needs to work on deep squats and return to standing from squats, agility drills, treadmill training with focus on increasing speed and potentially running? Half kneel, ladder drills, carrying weight with both hands (on rockerboard, stairs), agility ladder. Jumping. Wobble bar   Pt goals: squats (for patient care), time based tasks, not lose my balance (would lose balance with perturbations/reactive balance), multitasking/cog dual tasking (talking while doing tasks). Work simulation tasks (bed mobility, transfers, holding items)   Marvon Shillingburg E Terin Cragle, PT,  DPT 04/05/2024, 3:29 PM

## 2024-04-05 NOTE — Telephone Encounter (Signed)
 Enrolled pt in [employer] Program, I should have the card within a few days.

## 2024-04-05 NOTE — Telephone Encounter (Signed)
 I started a [employer] encounter today, all documentation will be done there.

## 2024-04-05 NOTE — Telephone Encounter (Signed)
 Left persistent clonus AJ, increase distal lower extremity tone, foot inversion, affecting her quality of life, cannot take baclofen  or other oral medications due to sedation, discussed with Dr. Gracie Lav will request 300U [employer] for stroke and spasticity.  ICD I69.398,  R25.2

## 2024-04-09 ENCOUNTER — Ambulatory Visit: Admitting: Occupational Therapy

## 2024-04-09 ENCOUNTER — Ambulatory Visit: Admitting: Physical Therapy

## 2024-04-09 DIAGNOSIS — R29818 Other symptoms and signs involving the nervous system: Secondary | ICD-10-CM

## 2024-04-09 DIAGNOSIS — M6281 Muscle weakness (generalized): Secondary | ICD-10-CM

## 2024-04-09 DIAGNOSIS — R29898 Other symptoms and signs involving the musculoskeletal system: Secondary | ICD-10-CM

## 2024-04-09 DIAGNOSIS — R278 Other lack of coordination: Secondary | ICD-10-CM | POA: Diagnosis not present

## 2024-04-09 DIAGNOSIS — I6381 Other cerebral infarction due to occlusion or stenosis of small artery: Secondary | ICD-10-CM

## 2024-04-09 DIAGNOSIS — I69354 Hemiplegia and hemiparesis following cerebral infarction affecting left non-dominant side: Secondary | ICD-10-CM | POA: Diagnosis not present

## 2024-04-09 DIAGNOSIS — R2681 Unsteadiness on feet: Secondary | ICD-10-CM

## 2024-04-09 DIAGNOSIS — R2689 Other abnormalities of gait and mobility: Secondary | ICD-10-CM | POA: Diagnosis not present

## 2024-04-09 DIAGNOSIS — R41841 Cognitive communication deficit: Secondary | ICD-10-CM | POA: Diagnosis not present

## 2024-04-09 DIAGNOSIS — M5459 Other low back pain: Secondary | ICD-10-CM | POA: Diagnosis not present

## 2024-04-09 DIAGNOSIS — I69344 Monoplegia of lower limb following cerebral infarction affecting left non-dominant side: Secondary | ICD-10-CM | POA: Insufficient documentation

## 2024-04-09 NOTE — Therapy (Signed)
 OUTPATIENT PHYSICAL THERAPY NEURO TREATMENT    Patient Name: Faith Jordan MRN: [id number] DOB:[birth year], 28 y.o., female Today's Date: 04/09/2024   PCP: Gretta Comer POUR, NP REFERRING PROVIDER: Carilyn Prentice BRAVO, MD   END OF SESSION:  PT End of Session - 04/09/24 1101     Visit Number 20    Number of Visits 25   recert   Date for PT Re-Evaluation 05/10/24   recert, to allow for scheduling delays   Authorization Type [employer]    PT Start Time 1100    PT Stop Time 1144    PT Time Calculation (min) 44 min    Equipment Utilized During Treatment Gait belt    Activity Tolerance Patient tolerated treatment well    Behavior During Therapy [facility] for tasks assessed/performed                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   Acute flank pain 06/02/2022   Angio-edema 04/27/2016   Angioedema    Aquagenic angio-edema-urticaria    Asthma    no problems /not used recently   Dysautonomia (HCC)    Dysrhythmia    sinus tachycardia   Fibroid    right breast, adenoma   Headache(784.0)    History of COVID-19 11/21/2020   Hives 12/22/2015   Pneumonia    hx  6th grade   Sensation of fullness in both ears 02/02/2023   Snoring 02/11/2022   Urinary frequency 02/12/2019   Vaginal yeast infection 06/02/2022   Vasculitis (HCC)    Past Surgical History:  Procedure Laterality Date   ADENOIDECTOMY     BREAST LUMPECTOMY Right    BREAST LUMPECTOMY Right    MASS EXCISION Right 10/07/2014   Procedure: EXCISION OF RIGHT BREAST MASS;  Surgeon: Krystal Russell, MD;  Location: [facility] OR;  Service: General;  Laterality: Right;   TONSILLECTOMY AND ADENOIDECTOMY     TOOTH EXTRACTION     TRANSESOPHAGEAL ECHOCARDIOGRAM (CATH LAB) N/A 01/10/2024   Procedure: TRANSESOPHAGEAL ECHOCARDIOGRAM;  Surgeon: Jeffrie Oneil BROCKS, MD;  Location: [facility] INVASIVE CV LAB;  Service: Cardiovascular;  Laterality: N/A;   Patient Active Problem List    Diagnosis Date Noted   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 (CVA) 04/05/2024   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) 02/10/2024   Coping style affecting medical condition 01/19/2024   History of CVA (cerebrovascular accident) 01/12/2024   Stroke of right basal ganglia (HCC) 01/07/2024   Non-restorative sleep 05/26/2022   Sleep paralysis, 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   Vivid dream 05/26/2022   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   Excessive daytime sleepiness 05/26/2022   Sleep disturbance 03/17/2022   Panic attacks 02/11/2022   Vitamin B12 deficiency 07/30/2021   Vitamin D  deficiency 07/30/2021   Family history of hypothyroidism 07/30/2021   GAD (generalized anxiety disorder) 03/18/2020   Fatigue 11/06/2019   Chronic back pain 07/06/2019   Preventative health care 04/27/2018   Migraines 04/25/2018   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   Anemia 01/04/2018   Headache disorder 12/29/2015   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   Sinus tachycardia 12/22/2015   Dysautonomia (HCC) 12/22/2015   Dysautonomia, familial (HCC) 04/22/2015   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   Neurocardiogenic syncope 04/06/2013   Intermittent palpitations 04/06/2013    ONSET DATE: 01/26/2024 (referral)   REFERRING DIAG:  I63.81 (ICD-10-CM) - Other cerebral infarction due to occlusion or stenosis of small  artery    THERAPY DIAG:  Muscle weakness (generalized)  Unsteadiness on feet  Other abnormalities of gait and mobility  Other symptoms and signs involving the nervous system  Other symptoms and signs involving the musculoskeletal system  Rationale for Evaluation and Treatment: Rehabilitation  SUBJECTIVE:                                                                                                                                                                                             SUBJECTIVE STATEMENT: Pt  denies any acute changes since last visit. Pt still hoping to go back to work on July 10 doing 4 hour shifts 2 days/week, still has a follow-up with Dr. MARLA on 7/3 before she goes back. Pt has not yet heard from [facility] about scheduling her Botox .  Pt has noticed a difference on the medication that is supposed to help with her energy levels, reports that she feels totally different. She reports that if she takes 50 mg she can still take a nap but if she takes 100 mg she can do anything she wants.  She has gotten up to walking x 22 min every day, has noticed that her L knee will soft hyperextend once she has walked that much. She also feels like her L hip is lagging behind. Pt able to bring in video of her walking to demonstrate what she is talking about. She only notices the L knee hyperextension when she straight up exerts her body, does not notice it if she is doing chores around the house, etc. She has no pain with this just feels like she doesn't have control of her LLE.  Pt accompanied by: self  PERTINENT HISTORY: familiar hemiplegic migraine, angioedema 2017,dysautonomia; infarct right internal capsule/ R basal ganglia stroke  PAIN:  Are you having pain? No    PRECAUTIONS: Fall  RED FLAGS: None   WEIGHT BEARING RESTRICTIONS: No  FALLS: Has patient fallen in last 6 months? No  LIVING ENVIRONMENT: Lives with: lives with their family Lives in: House/apartment Stairs: Yes: External: 4 steps; bilateral but cannot reach both Has following equipment at home: shower chair  PLOF: Independent  PATIENT GOALS: Being normal again. Being able to go back to work and be independent   OBJECTIVE:  Note: Objective measures were completed at Evaluation unless otherwise noted.  DIAGNOSTIC FINDINGS: MRI of brain on 01/07/24  IMPRESSION: 1. 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 2. Susceptibility related calcifications in the globus  pallidus bilaterally. This likely reflects the sequela of chronic microvascular ischemia.  COGNITION: Overall cognitive status: Within  functional limits for tasks assessed   SENSATION: Pt denies numbness/tingling in L hemibody   MUSCLE TONE: Non-fatiguing clonus of LLE  POSTURE: No Significant postural limitations  LOWER EXTREMITY ROM:     Active  Right Eval Left Eval  Hip flexion    Hip extension    Hip abduction    Hip adduction    Hip internal rotation    Hip external rotation    Knee flexion    Knee extension    Ankle dorsiflexion    Ankle plantarflexion    Ankle inversion    Ankle eversion     (Blank rows = not tested)  LOWER EXTREMITY MMT:  Tested in seated position   MMT Right Eval Left Eval  Hip flexion 5 4+  Hip extension    Hip abduction 5 5  Hip adduction 5 5  Hip internal rotation    Hip external rotation    Knee flexion 5 5  Knee extension 5 4+  Ankle dorsiflexion 5 5  Ankle plantarflexion    Ankle inversion    Ankle eversion    (Blank rows = not tested)  BED MOBILITY:  Not tested  TRANSFERS: Sit to stand: Complete Independence  Assistive device utilized: None     Stand to sit: Complete Independence  Assistive device utilized: None      RAMP:  Not tested  CURB:  Not tested  GAIT: Gait pattern: step through pattern, decreased arm swing- Left, decreased step length- Right, decreased stance time- Left, decreased stride length, decreased hip/knee flexion- Left, lateral hip instability, and wide BOS Distance walked: Various clinic distances  Assistive device utilized: None Level of assistance: Modified independence Comments: Pt walks very guarded, as if she feels unstable. No instability noted other than single instance of L knee buckling which pt able to self-correct.    PATIENT SURVEYS:  ABC scale to be obtained    VITALS  There were no vitals filed for this visit.                                                                                                                               TREATMENT:   Self-Care  Reviewed video that patient brings in of her walking following her 22 min walk to observe her gait impairments: Pt does exhibit L knee hyperextension with walking as well as ongoing clonus Per pt report she would initially notice this when she first started walking, now she only notices it when she has really exerted herself  Physical Performance For STG assessment: HiMat: 13/54 5 deep squats with no UE support, mod I   NMR To work on proximal hip strengthening: In half-kneel position on red mat on floor: Alt L/R forward lunges x 10 reps B Initially with black resistance band around hips but too difficult so reverted to no resistance Alt L/R LE transition from tall-kneel to half-kneel position x 10 reps B More difficulty stabilizing on LLE  to bring RLE up/down To work on increasing trunk/hip rotation on L side: In tall-kneel position on red mat on floor facing mat table: Reaching to the L for cones to retrieve and place back on L side Initial 10 reps each direction with manual assist to increase L-side rotation Second 10 reps each direction with manual assist blocking L-side rotation to increase patient effort of pushing her hip and trunk into rotation    PATIENT EDUCATION: Education details: continue HEP Person educated: Patient Education method: Medical illustrator Education comprehension: verbalized understanding and returned demonstration  HOME EXERCISE PROGRAM: Access Code: [email or web address] URL: [email or web address] Date: 02/13/2024 Prepared by: Marlon Plaster  Exercises - Half Kneeling Lift  - 1 x daily - 7 x weekly - 1 sets - 10 reps - Down Dog with Bent Knees and Heels on Mat  - 1 x daily - 7 x weekly - 2-3 reps - 15-30 seconds hold - Bird Dog  - 1 x daily - 7 x weekly - 3 sets - 10 reps - Lateral Shuffles  - 1 x daily - 7 x weekly - 3 sets - 10 reps - Braided  Sidestepping  - 1 x daily - 7 x weekly - 3 sets - 10 reps - Standing Balance Activity: Plyometric Modified Lower Extremity Jumping Jack  - 1 x daily - 7 x weekly - 3 sets - 10 reps - Standing Forward Step Taps with Counter Support  - 1 x daily - 7 x weekly - 3 sets - 10 reps (at bottom of steps with no UE support, red TB around ankles) - Ankle Inversion with Resistance  - 1 x daily - 7 x weekly - 3 sets - 10 reps - Modified Thomas Stretch  - 1 x daily - 7 x weekly - 2-3 sets - 1-2 minutes  hold - Supine Straight Leg Lumbar Rotation Stretch  - 1 x daily - 7 x weekly - 3 sets - 10 reps - Psoas Mobilization with Small Ball  - 1 x daily - 7 x weekly - 3 sets - 2-3 minute hold - Pigeon Pose  - 1 x daily - 7 x weekly - 1-2 sets - 1-2 minutes hold  From CIR Therapist: Also instructed pt in 2 additional exercises but no printed versions available: - tall kneeling to half kneeling - sit to half kneeling and then return to sit. Perform to each side.   Verbally added 02/21/24: -squats at countertop with chair behind you  Verbally added 03/13/24: - eccentric heel taps  - jumping over line fwd/retro/laterally   Verbally added 03/27/24  - Pallof press rotations w/red theraband   GOALS: Goals reviewed with patient? Yes  SHORT TERM GOALS: Target date: 02/28/2024  Pt will improve FGA to 28/30 for decreased fall risk  Baseline: 21/30, 25/30 (5/13) Goal status: IN PROGRESS  2.  HiMat to be assessed at STG check and LTG updated  Baseline: 11/54 (5/13) Goal status: MET  3.  Pt will improve gait velocity to at least 3.4 ft/s for improved gait efficiency and return to PLOF Baseline: 3.0 ft/sec (eval), 3.31 ft/sec (5/13) Goal status: IN PROGRESS  4.  ABC scale to be assessed and LTG updated  Baseline: 70.6% (5/13) Goal status: MET  5.  Pt will demonstrate proper lifting technique of 25# weight from floor for proper body mechanics and safety at work Baseline:  Goal status: MET  LONG TERM GOALS:  Target date: 03/13/2024  Pt will improve gait velocity to at  least 3.5 ft/s for improved gait efficiency and independence Baseline: 3.0 ft/sec (eval), 3.31 ft/sec (5/13), 3.57 ft/sec (5/29) Goal status: MET  2.  Pt will improve her score on the HiMat to >/= 16/54 to demonstrate improved balance and function Baseline: 11/54 (5/13), 21/54 (5/29) Goal status: MET  3.  Pt will improve her ABC scale score to >/= 80% for improved confidence in her balance. Baseline: 70.6% (5/13), 90% (5/29) Goal status: MET  4.  Pt will be able to carry 10# object in BUEs up/down 4 steps for improved coordination and safety in work environment   Baseline: can only carry w/single UE, can carry with BUE mod I (5/29) Goal status: MET  5.  Pt will perform [place] get up w/5# weight for improved shoulder stability OH, core stability and high level balance  Baseline: CGA to min A to perform (5/29) Goal status: MET  NEW SHORT TERM GOALS:   Target date: 04/05/2024  Pt will improve her score on the HiMat to >/= 26/54 to demonstrate improved balance and function Baseline: 11/54 (5/13), 21/54 (5/29), 13/54 (6/23) Goal status: NOT MET  2.  Pt will be able to perform 5 deep squats and return to standing to demonstrate improved function Baseline: 2 squats before unable to stand up without physical assist, 5 squats mod I (6/23) Goal status: MET  3.  Manual TUG to be assessed and LTG set Baseline: to be assessed Goal status: MET  4.  Cognitive TUG to be assessed and LTG set Baseline: to be assessed Goal status: MET    NEW LONG TERM GOALS:  Target date: 04/26/2024  Pt will improve gait velocity to at least 3.75 ft/s for improved gait efficiency and independence Baseline: 3.0 ft/sec (eval), 3.31 ft/sec (5/13), 3.57 ft/sec (5/29) Goal status: INITIAL  2.  Pt will improve her score on the HiMat to >/= 25/54 to demonstrate improved balance and function Baseline: 11/54 (5/13), 21/54 (5/29), 13/54 (6/23) Goal  status: REVISED  3.  Pt will be able to perform 10 deep squats and return to standing to demonstrate improved function Baseline: 2 squats before unable to stand up without physical assist Goal status: INITIAL  4.  Pt will improve manual TUG to less than or equal to 12 seconds for improved functional mobility and decreased fall risk.  Baseline: 17.59s Goal status: INITIAL  5.  Pt will improve cognitive TUG to less than or equal to 10 seconds for improved functional mobility and decreased fall risk.  Baseline: 12.35s counting backwards by 8 Goal status: INITIAL      ASSESSMENT:  CLINICAL IMPRESSION: Emphasis of skilled PT session on assessing STG, observing patient's gait with onset of fatigue to address gait deviations, and working on functional LLE NMR and strengthening in order to reduce gait deviations. Pt has met 3/4 STG as she is able to perform 5 deep squats at mod I level with no AD but increased time needed and she completed the cognitive and manual TUG in order for LTGs to be set. She scored lower on the HiMat as compared to previous assessment, however she still scored higher than initial assessment and overall demonstrates improved form and ability to walk on her toes, running, skipping, and bounding. She still has significant difficulty being able to hop on her affected (L) LE. She continues to benefit from LLE NMR and strengthening in order to reduce her gait deviations and prevent pain and injury long-term. Continue POC.   OBJECTIVE IMPAIRMENTS: Abnormal gait, decreased activity  tolerance, decreased balance, decreased coordination, decreased endurance, decreased mobility, decreased strength, increased muscle spasms, impaired UE functional use, improper body mechanics, and pain  ACTIVITY LIMITATIONS: carrying, lifting, squatting, stairs, reach over head, hygiene/grooming, and locomotion level  PARTICIPATION LIMITATIONS: meal prep, cleaning, laundry, interpersonal  relationship, driving, shopping, community activity, occupation, and yard work  PERSONAL FACTORS: 1 comorbidity: R internal capsule infarct/R basal ganglia stroke are also affecting patient's functional outcome.   REHAB POTENTIAL: Excellent  CLINICAL DECISION MAKING: Evolving/moderate complexity  EVALUATION COMPLEXITY: Moderate  PLAN:  PT FREQUENCY: 2-3x/week (starting w/2x following eval)  PT DURATION: 6 weeks  PLANNED INTERVENTIONS: 02835- PT Re-evaluation, 97750- Physical Performance Testing, 97110-Therapeutic exercises, 97530- Therapeutic activity, [id number]- Neuromuscular re-education, 97535- Self Care, [id number]- Manual therapy, [phone]- Gait training, [phone]- Canalith repositioning, [phone]- Aquatic Therapy, Patient/Family education, Balance training, Stair training, Taping, Dry Needling, Joint mobilization, Spinal mobilization, Vestibular training, and DME instructions  PLAN FOR NEXT SESSION:  High level agility, elliptical, L quad eccentric strength, OH tasks. Practice w/plastic needles Marvetta has these) in oranges and pin the needle to targets? Cognitive dual tasking, needs to work on deep squats and return to standing from squats, agility drills, treadmill training with focus on increasing speed and potentially running? Half kneel, ladder drills, carrying weight with both hands (on rockerboard, stairs), agility ladder. Jumping. Wobble bar, standing hip half circles on airex   Pt goals: squats (for patient care), time based tasks, not lose my balance (would lose balance with perturbations/reactive balance), multitasking/cog dual tasking (talking while doing tasks). Work simulation tasks (bed mobility, transfers, holding items)   Waddell Southgate, PT Waddell Southgate, PT, DPT, CSRS  04/09/2024, 11:45 AM

## 2024-04-09 NOTE — Telephone Encounter (Signed)
 Done, thank you!

## 2024-04-09 NOTE — Therapy (Signed)
 OUTPATIENT OCCUPATIONAL THERAPY NEURO TREATMENT  Patient Name: Faith Jordan MRN: [id number] DOB:July 24, 1996, 28 y.o., female Today's Date: 04/09/2024  PCP: Gretta Comer POUR, NP  REFERRING PROVIDER: Carilyn Prentice BRAVO, MD   END OF SESSION:  OT End of Session - 04/09/24 1147     Visit Number 15    Number of Visits 25   including eval   Date for OT Re-Evaluation 05/25/24    Authorization Type [employer] [facility]) 2025    OT Start Time 1146    OT Stop Time 1231    OT Time Calculation (min) 45 min    Activity Tolerance Patient tolerated treatment well    Behavior During Therapy [facility] for tasks assessed/performed          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   Acute flank pain 06/02/2022   Angio-edema 04/27/2016   Angioedema    Aquagenic angio-edema-urticaria    Asthma    no problems /not used recently   Dysautonomia (HCC)    Dysrhythmia    sinus tachycardia   Fibroid    right breast, adenoma   Headache(784.0)    History of COVID-19 11/21/2020   Hives 12/22/2015   Pneumonia    hx  6th grade   Sensation of fullness in both ears 02/02/2023   Snoring 02/11/2022   Urinary frequency 02/12/2019   Vaginal yeast infection 06/02/2022   Vasculitis (HCC)    Past Surgical History:  Procedure Laterality Date   ADENOIDECTOMY     BREAST LUMPECTOMY Right    BREAST LUMPECTOMY Right    MASS EXCISION Right 10/07/2014   Procedure: EXCISION OF RIGHT BREAST MASS;  Surgeon: Krystal Russell, MD;  Location: [facility] OR;  Service: General;  Laterality: Right;   TONSILLECTOMY AND ADENOIDECTOMY     TOOTH EXTRACTION     TRANSESOPHAGEAL ECHOCARDIOGRAM (CATH LAB) N/A 01/10/2024   Procedure: TRANSESOPHAGEAL ECHOCARDIOGRAM;  Surgeon: Jeffrie Oneil BROCKS, MD;  Location: [facility] INVASIVE CV LAB;  Service: Cardiovascular;  Laterality: N/A;   Patient Active Problem List   Diagnosis Date Noted   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 (CVA)  04/05/2024   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) 02/10/2024   Coping style affecting medical condition 01/19/2024   History of CVA (cerebrovascular accident) 01/12/2024   Stroke of right basal ganglia (HCC) 01/07/2024   Non-restorative sleep 05/26/2022   Sleep paralysis, 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   Vivid dream 05/26/2022   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   Excessive daytime sleepiness 05/26/2022   Sleep disturbance 03/17/2022   Panic attacks 02/11/2022   Vitamin B12 deficiency 07/30/2021   Vitamin D  deficiency 07/30/2021   Family history of hypothyroidism 07/30/2021   GAD (generalized anxiety disorder) 03/18/2020   Fatigue 11/06/2019   Chronic back pain 07/06/2019   Preventative health care 04/27/2018   Migraines 04/25/2018   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   Anemia 01/04/2018   Headache disorder 12/29/2015   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   Sinus tachycardia 12/22/2015   Dysautonomia (HCC) 12/22/2015   Dysautonomia, familial (HCC) 04/22/2015   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   Neurocardiogenic syncope 04/06/2013   Intermittent palpitations 04/06/2013    ONSET DATE: 01/26/2024 (referral date)  REFERRING DIAG: I63.81 (ICD-10-CM) - Other cerebral infarction due to occlusion or stenosis of small artery   THERAPY DIAG:  Muscle weakness (generalized)  Other lack of coordination  Other symptoms and signs involving the nervous system  Other symptoms and signs involving the musculoskeletal system  Hemiplegia and hemiparesis following cerebral infarction affecting left non-dominant side (HCC)  Cerebrovascular accident (CVA) of right basal ganglia (HCC)  Stroke of right basal ganglia (HCC)  Rationale for Evaluation and Treatment: Rehabilitation  SUBJECTIVE:   SUBJECTIVE STATEMENT: Pronunciation: Bree[place]Anne-ah  She finds that the apps she has for cognition are not challenging enough for her.    Pt accompanied by: self   PERTINENT HISTORY: familiar hemiplegic migraine, angioedema 2017,dysautonomia; infarct right internal capsule/ R basal ganglia stroke   MRI of brain on 01/07/24   IMPRESSION: 1. 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 2. Susceptibility related calcifications in the globus pallidus bilaterally. This likely reflects the sequela of chronic microvascular ischemia.  PRECAUTIONS: Fall, Per 02/10/24 MD Office Visit: Pt may gradually begin to return to driving.   WEIGHT BEARING RESTRICTIONS: No  PAIN:  Are you having pain? No  FALLS: Has patient fallen in last 6 months? Yes when CVA event occurred.  03/05/24 - Pt reported recent fall after trying to get clothes out of dryer. Pt denied injuries and able to ind get up after fall.   LIVING ENVIRONMENT: Lives with: lives with their family Lives in: House/apartment Stairs: Yes: External: 4 steps; bilateral but cannot reach both Has following equipment at home: shower chair  PLOF: Independent, typically living ind, worked as Engineer, civil (consulting)  PATIENT GOALS: I want to get my hand and arm back and be able to go to work.  OBJECTIVE:  Note: Objective measures were completed at Evaluation unless otherwise noted.  HAND DOMINANCE: Right  ADLs: Overall ADLs:  Transfers/ambulation related to ADLs: Eating: ind Grooming: difficulty with completing hair styling e.g. putting hair up in ponytail, difficulty with curling hair. Uses RUE to complete toothbrushing and brushing hair.  Difficulty with clipping fingernails. UB Dressing: ind, extra time. Buttons and zippers difficult.  LB Dressing: Ind, difficulty with pulling up pants and buttoning buttons on jeans, extra time for tying shoes Toileting: ind  Bathing: difficulty washing hair d/t unable to apply pressure while moving LUE, typically uses RUE to ensure thoroughness, shower bench is available PRN Tub Shower transfers: walk-in  shower, pt reported SOB after showering or when walking a lot, sometimes requires rest breaks Equipment: Shower seat with back and Walk in shower  IADLs: Shopping: assistance from parents but pt participates, such as trying to reach for items on shelf and pushing cart Light housekeeping: pt completes dishwashing, has not attempted laundry Meal Prep: pt reported cooking but task is tiring Community mobility: has attempted to drive 1x with parent  Handwriting: No change per pt.   Work activities: Pt expressed concerns with FM coordination and concerns about return to work considerations, such as coordination required for drawing up syringes from a vial for medication administration, completing IVs, typing, navigating phone.   MOBILITY STATUS: Independent  POSTURE COMMENTS:  Ind sitting balance, sometimes forward shoulder posture  ACTIVITY TOLERANCE: Activity tolerance: Pt reported I'm doing more than I was doing in the hospital, but I definitely get tired easier and take more breaks.  FUNCTIONAL OUTCOME MEASURES: PSF: 3.0  03/15/24 - PSFS: 7.0    UPPER EXTREMITY ROM:    Active ROM Right eval Left eval  Shoulder flexion [facility]  Shoulder abduction  [facility]  Shoulder adduction    Shoulder extension    Shoulder internal rotation    Shoulder external rotation    Elbow flexion  WFL - slightly  delayed movements  Elbow extension    Wrist flexion    Wrist extension    Wrist ulnar deviation    Wrist radial deviation    Wrist pronation    Wrist supination    (Blank rows = not tested)  OT palpated pt's L shoulder and noted tightness of muscles at L shoulder.   UPPER EXTREMITY MMT:     MMT Right eval Left eval  Shoulder flexion 5 3+   HAND FUNCTION: Grip strength: Right: 48.9, 53.5, 53.3 (51.9 lbs average) lbs; Left: 24.2, 22.2, 22.7 (23.1 lbs average) lbs  COORDINATION: 9 Hole Peg test: Right: 20 sec; Left: 40 sec  Pt reported practicing picking up pills, sorting  change, and playing connect-4 at home.  SENSATION: Pt reported tingling in L thumb which has since resolves. Pt questioned if symptoms might have been from resting hand splint at night. Pt reported no other symptoms of numbness/tingling.  EDEMA: None noted  MUSCLE TONE: RUE: Within functional limits and LUE: slight ataxia  COGNITION: Overall cognitive status: Within functional limits for tasks assessed  Pt reported some difficulty with word-finding.  Pt reported sometimes asking same question again a few hours apart. Pt's mother reported not noticing this recently.  VISION: Subjective report: Pt reported initial changes to vision (unable to read, blurry vision close-up and far away) though okay now.  Baseline vision: No visual deficits Visual history: none noted  VISION ASSESSMENT: Pt accurately clock on wall and reported reading books on Kindle. Pt reported still using large print on phone since hospital.   Pt tracked easily to all 4 quadrants. Peripheral vision - WFL B sides, ?slightly decreased on L side though functional regardless  Patient has difficulty with following activities due to following visual impairments: None noted  PERCEPTION: Not tested  PRAXIS: Not tested  OBSERVATIONS: Pt ambulated ind. Pt soft-spoken and appeared tired during session. Pt was pleasant and appeared motivated to complete LUE functional tasks at home based on pt report.                                                                                                                             TODAY'S TREATMENT:    - Therapeutic activities completed for duration as noted below including: Pt completed arm bike in sitting for 5 minutes with average RPM of 37 at level 7 for endurance, ROM, and strengthening of affected extremity using reciprocal arm movements. Pt alternating direction of pedaling halfway through. Intermittent cues provided to maintain stability with respect to anterior/posterior  trunk lean and consistent grasp maintenance.  Pt picked up and placed Squigz, suction toys of various sizes and shapes, using affected LUE for improved strength, ROM, and overall functional use.   With use of left, pt placed and then removed colored, small pegs into corresponding hole with use of pattern sheets for ROM, coordination, and strength of affected extremity at arms reach from mirror board while in sitting.   Pt  participated in 5 games of Uzzle at level(s) 1, 2, and 3 using LUE for eye-hand coordination and cognition by matching game pieces to visual patterns. Pt demonstrating ability to solve level(s) 1, 2, and 3.  OT educated pt on some cognitive apps including Words of Wonder, Elevate, constant therapy, or Impulse. She was encouraged to play Golf Solitaire at home for LUE ROM, fine motor coordination, and ability to make quick decisions.   PATIENT EDUCATION: Education details: functional activities; endurance; cognition - deductive reasoning  Person educated: Patient Education method: Explanation, Demonstration, and Verbal cues Education comprehension: verbalized understanding, returned demonstration, verbal cues required, and needs further education  HOME EXERCISE PROGRAM: 02/09/24 - Affected UE ROM and Yellow Theraputty. Access Code: [email or web address]. 02/15/2024: L handed typing words 02/21/24 - Digit ext with theraputty or rubber band (access code: [email or web address]). Functional reach: Handwritten/Verbal instructions: 1 set in front of mirror, 1 set without mirror: Reaching overhead to touch top of head with affected hand, reaching overhead to touch back of head with affected hand, reaching arm forward and touching nose with affected UE - 10 reps, 2 sets, 2x per day. 03/07/2024: Theraband; dowel 03/13/24 - graded up to pink theraputty. Reps/sets adjusted: Access Code: [email or web address]. 03/15/24 - Verbal instructions: Daily typing practice. LUE overhead reaching - Access Code: [email or web address]  GOALS: Goals reviewed  with patient? Yes  SHORT TERM GOALS: Target date: 03/09/24  Pt will ind demo understanding of HEP using visual handouts. Baseline: new to outpt OT Goal status: MET  2.  Pt will ind recall at least 3 energy conservation strategies. Baseline: Activity tolerance: Pt reported I'm doing more than I was doing in the hospital, but I definitely get tired easier and take more breaks.  03/13/24 - Pt ind recalled breathing, spacing out activities throughout the day, naps, taking breaks PRN.  Goal status: MET  3.  Pt and/or family will return demo of personal e-stim unit use and electrode placement for affected UE.  Baseline: pt reported she has ordered a personal e-stim unit.  02/09/24 - Education and handout provided. Pt returned demo of using personal e-stim unit for wrist flex/ext of affected UE. Goal status: MET  4.  Pt will report improved ability to use LUE during overhead reaching tasks with minimal use of RUE to compensate, such as hair care tasks and washing hair. Baseline: pt reported difficulty with hair styling (e.g. putting hair in ponytail, curling hair) 03/13/24 - Pt reported continued difficulty with putting hair in a bun and washing hair. Goal status: in progress  LONG TERM GOALS: Target date: 05/25/2024   Patient will report at least two-point increase in average PSFS score or at least three-point increase in a single activity score indicating functionally significant improvement given minimum detectable change using adaptive strategies and A/E PRN. Baseline: PSF: 3.0 total score (See above for individual activity scores)  03/15/24 - PSFS: 7.0 Goal status: MET and ongoing  2.  Patient will demonstrate at least  37 lbs LUE grip strength as needed to open jars and other containers.  Baseline: Grip strength: Right: 48.9, 53.5, 53.3 (51.9 lbs average) lbs; Left: 24.2, 22.2, 22.7 (23.1 lbs average) lbs 03/07/2024: LUE - 33.1 lbs Goal status: MET on 03/07/24 and revised on 03/13/24  3.   Patient will demo improved FM coordination as evidenced by completing nine-hole peg with use of LUE in 30 seconds or less.  Baseline: 9 Hole Peg test: Right: 20 sec; Left: 40 sec 03/07/2024: 34 sec Goal status: in progress  4.  Pt will verbalize understanding of A/E, adaptive strategies for functional tasks as needed for return to work considerations. Baseline: Work activities: Pt expressed concerns with FM coordination and concerns about return to work considerations, such as coordination required for drawing up syringes from a vial for medication administration, completing IVs, typing, navigating phone. Goal status: in progress  ASSESSMENT:  CLINICAL IMPRESSION: Pt demonstrating mild difficulty with deductive reasoning requiring encouragement and cues for task completion. She would benefit from focus on cognitive challenges as well as LUE gross motor coordination and overall endurance.   PERFORMANCE DEFICITS: in functional skills including ADLs, IADLs, coordination, dexterity, proprioception, sensation, tone, ROM, strength, pain, muscle spasms, flexibility, Fine motor control, Gross motor control, mobility, balance, endurance, and UE functional use, cognitive skills including attention and energy/drive, and psychosocial skills including environmental adaptation.   IMPAIRMENTS: are limiting patient from ADLs, IADLs, rest and sleep, work, leisure, and social participation.   CO-MORBIDITIES: may have co-morbidities  that affects occupational performance. Patient will benefit from skilled OT to address above impairments and improve overall function.  REHAB POTENTIAL: Good  PLAN:  OT FREQUENCY: Additional 1-2x/week  OT DURATION: Additional  6 weeks   PLANNED INTERVENTIONS: 97168 OT Re-evaluation, 97535 self care/ADL training, 02889 therapeutic exercise, 97530 therapeutic activity, 97112 neuromuscular re-education, 97140 manual therapy, 97035 ultrasound, 97018 paraffin, 02960 fluidotherapy,  97010 moist heat, 97010 cryotherapy, 97760 Orthotic Initial, 97761 Prosthetic Initial, 97763 Orthotic/Prosthetic subsequent, passive range of motion, functional mobility training, visual/perceptual remediation/compensation, energy conservation, patient/family education, and DME and/or AE instructions  RECOMMENDED OTHER SERVICES: N/A for this visit  CONSULTED AND AGREED WITH PLAN OF CARE: Patient   PLAN FOR NEXT SESSION: work simulation (nurse in ED)  LUE strength and gross motor coordination; tossing ball and naming items while walking?  Jocelyn CHRISTELLA Bottom, OT 04/09/2024, 2:29 PM

## 2024-04-09 NOTE — Telephone Encounter (Signed)
 Could you please add I69.944 to your note? This is a covered [employer] code for monoplegia following a stroke. Thank you!

## 2024-04-09 NOTE — Telephone Encounter (Signed)
 [employer] copay card:

## 2024-04-10 ENCOUNTER — Encounter: Payer: Self-pay | Admitting: Adult Health

## 2024-04-10 ENCOUNTER — Telehealth (INDEPENDENT_AMBULATORY_CARE_PROVIDER_SITE_OTHER): Payer: Self-pay | Admitting: Adult Health

## 2024-04-10 DIAGNOSIS — G4700 Insomnia, unspecified: Secondary | ICD-10-CM

## 2024-04-10 DIAGNOSIS — F411 Generalized anxiety disorder: Secondary | ICD-10-CM | POA: Diagnosis not present

## 2024-04-10 DIAGNOSIS — F410 Panic disorder [episodic paroxysmal anxiety] without agoraphobia: Secondary | ICD-10-CM | POA: Diagnosis not present

## 2024-04-10 DIAGNOSIS — F429 Obsessive-compulsive disorder, unspecified: Secondary | ICD-10-CM | POA: Diagnosis not present

## 2024-04-10 NOTE — Progress Notes (Signed)
 Faith Jordan [id number] [birth year] 28 y.o.  Virtual Visit via Video Note  I connected with pt @ on 04/10/24 at 10:00 AM EDT by a video enabled telemedicine application and verified that I am speaking with the correct person using two identifiers.   I discussed the limitations of evaluation and management by telemedicine and the availability of in person appointments. The patient expressed understanding and agreed to proceed.  I discussed the assessment and treatment plan with the patient. The patient was provided an opportunity to ask questions and all were answered. The patient agreed with the plan and demonstrated an understanding of the instructions.   The patient was advised to call back or seek an in-person evaluation if the symptoms worsen or if the condition fails to improve as anticipated.  I provided 25 minutes of non-face-to-face time during this encounter.  The patient was located at home.  The provider was located at [facility].   Angeline LOISE Sayers, NP   Subjective:   Patient ID:  Faith Jordan is a 28 y.o. (DOB May 17, 1996) female.  Chief Complaint: No chief complaint on file.   HPI GRETCHEN WEINFELD presents for follow-up of GAD, OCD, panic disorder, and insomnia.  Referred by therapist - Bambi Cottle.  Describes mood today as ok. Pleasant. Denies tearfulness. Mood symptoms - reports depression some days. Denies anxiety - concerned about her future. Reports decreased interest and motivation. Denies irritability. Reports a couple of panic attacks. Denies worry, rumination and over thinking - about having another stroke. Denies obsessive thoughts and acts. Reports mood as lower. Stating I feel like I'm doing better than I was. Feels like medications are helpful and does not wish to make any changes at this time. Taking medications as prescribed.   Energy levels low, but improved with the Modafinil . 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   Enjoys some usual interests and  activities. Married, but separated. Lives alone. Spending time with family.  Appetite decreased - different before stroke. Weight loss 15 pounds. Sleeps well most nights. Averages 8 hours. Reports less daytime napping.  Reports focus and concentration difficulties with recent strokes - started on Modafinil  50mg  daily with option to increase to 100mg . Completing tasks. Managing aspects of household. Out of work currently on STD - recent stroke Denies SI or HI.  Denies AH or VH. Denies self harm. Denies substance use.  Previous medication trials:  Cymbalta , Gabapentin , Trazadone, Hydroxyzine , Propranolol , Wellbutrin , Lorazepam  and Lexapro , Prozac , Zoloft , Prazosin , Effexor   Review of Systems:  Review of Systems  Musculoskeletal:  Negative for gait problem.  Neurological:  Negative for tremors.  Psychiatric/Behavioral:         Please refer to HPI    Medications: I have reviewed the patient's current medications.  Current Outpatient Medications  Medication Sig Dispense Refill   acetaminophen  (TYLENOL ) 325 MG tablet Take 2 tablets (650 mg total) by mouth every 6 (six) hours as needed for mild pain (pain score 1-3), fever or headache.     atorvastatin  (LIPITOR ) 40 MG tablet Take 1 tablet (40 mg total) by mouth daily for cholesterol. 90 tablet 3   baclofen  (LIORESAL ) 10 MG tablet Take 1 tablet (10 mg total) by mouth 3 (three) times daily. (Patient taking differently: Take 10 mg by mouth in the morning and at bedtime.) 270 each 3   botulinum toxin Type A  (BOTOX ) 200 units injection Provider to inject 155 units into the muscles of the head and neck every 12 weeks. Discard remainder. 1 each 3  Cholecalciferol (VITAMIN D3) 50 MCG (2000 UT) capsule Take 2,000 Units by mouth in the morning.     Coenzyme Q10 (COQ-10 PO) Take 1 tablet by mouth in the morning.     Galcanezumab -gnlm (EMGALITY ) 120 MG/ML SOAJ Inject 1 Dose into the skin every 30 (thirty) days.     LORazepam  (ATIVAN ) 1 MG tablet Take 1  tablet (1 mg total) by mouth every 6 (six) hours as needed for anxiety. 30 tablet 0   modafinil  (PROVIGIL ) 100 MG tablet Take 0.5-1 tablets (50-100 mg total) by mouth daily. 30 tablet 5   ondansetron  (ZOFRAN ) 8 MG tablet Take 8 mg by mouth as needed for nausea or vomiting.     Rimegepant Sulfate  (NURTEC) 75 MG TBDP Take 1 tablet by mouth as needed.     topiramate  (TOPAMAX ) 50 MG tablet Take 1 tablet (50 mg total) by mouth daily. May also take 1 tablet (50 mg total) daily as needed (daily during menstrual cycle). 180 tablet 3   traZODone  (DESYREL ) 100 MG tablet Take 1 tablet (100 mg total) by mouth at bedtime. 90 tablet 0   Ubrogepant  (UBRELVY ) 100 MG TABS Take 1 tablet by mouth as needed.     vitamin B-12 (CYANOCOBALAMIN ) 500 MCG tablet Take 500 mcg by mouth daily.     No current facility-administered medications for this visit.    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, blistering peeling skin   Nsaids Dermatitis    Reaction similar to Stephens-Johnson, blistering peeling rash   Sulfa Antibiotics Swelling    Facial swelling    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   Acute flank pain 06/02/2022   Angio-edema 04/27/2016   Angioedema    Aquagenic angio-edema-urticaria    Asthma    no problems /not used recently   Dysautonomia (HCC)    Dysrhythmia    sinus tachycardia   Fibroid    right breast, adenoma   Headache(784.0)    History of COVID-19 11/21/2020   Hives 12/22/2015   Pneumonia    hx  6th grade   Sensation of fullness in both ears 02/02/2023   Snoring 02/11/2022   Urinary frequency 02/12/2019   Vaginal yeast infection 06/02/2022   Vasculitis (HCC)     Family History  Problem Relation Age of Onset   Depression Mother    Migraines Mother    Narcolepsy Mother    Hypothyroidism Mother    Breast cancer Maternal  Grandmother 80   Myasthenia gravis Maternal Grandfather    Asthma Other    Cancer Other    Epilepsy Other    Allergic rhinitis Neg Hx    Angioedema Neg Hx    Atopy Neg Hx    Eczema Neg Hx    Immunodeficiency Neg Hx    Urticaria Neg Hx    Stroke Neg Hx     Social History   Socioeconomic History   Marital status: Significant Other    Spouse name: Not on file   Number of children: Not on file   Years of education: Not on file   Highest education level: Bachelor's degree (e.g., BA, AB, BS)  Occupational History   Not on file  Tobacco Use   Smoking status: Never   Smokeless tobacco: Never  Vaping Use   Vaping status: Never Used  Substance and Sexual Activity   Alcohol  use: No   Drug use: No   Sexual activity: Yes  Other Topics Concern   Not on file  Social History Narrative   Lives with fiance   R handed   Caffeine : 1 drink a day   Social Drivers of Corporate investment banker Strain: Not on file  Food Insecurity: No Food Insecurity (01/09/2024)   Hunger Vital Sign    Worried About Running Out of Food in the Last Year: Never true    Ran Out of Food in the Last Year: Never true  Transportation Needs: No Transportation Needs (01/09/2024)   PRAPARE - Administrator, Civil Service (Medical): No    Lack of Transportation (Non-Medical): No  Physical Activity: Not on file  Stress: Not on file  Social Connections: Not on file  Intimate Partner Violence: Not At Risk (01/09/2024)   Humiliation, Afraid, Rape, and Kick questionnaire    Fear of Current or Ex-Partner: No    Emotionally Abused: No    Physically Abused: No    Sexually Abused: No    Past Medical History, Surgical history, Social history, and Family history were reviewed and updated as appropriate.   Please see review of systems for further details on the patient's review from today.   Objective:   Physical Exam:  There were no vitals taken for this visit.  Physical Exam  Lab Review:      Component Value Date/Time   NA 139 02/24/2024 1521   NA 140 02/07/2024 0950   K 4.0 02/24/2024 1521   CL 107 02/24/2024 1521   CO2 20 (L) 02/24/2024 1515   GLUCOSE 122 (H) 02/24/2024 1521   BUN 6 02/24/2024 1521   BUN 9 02/07/2024 0950   CREATININE 0.90 02/24/2024 1521   CALCIUM  9.3 02/24/2024 1515   CALCIUM  8.9 01/07/2024 2231   PROT 6.8 02/24/2024 1515   PROT 7.2 02/07/2024 0950   ALBUMIN 3.9 02/24/2024 1515   ALBUMIN 4.7 02/07/2024 0950   AST 25 02/24/2024 1515   ALT 27 02/24/2024 1515   ALKPHOS 85 02/24/2024 1515   BILITOT 0.9 02/24/2024 1515   BILITOT 0.8 02/07/2024 0950   GFRNONAA >60 02/24/2024 1515   GFRAA >60 12/01/2017 1018       Component Value Date/Time   WBC 9.3 02/24/2024 1515   RBC 4.89 02/24/2024 1515   HGB 14.3 02/24/2024 1521   HGB 15.0 02/07/2024 0950   HCT 42.0 02/24/2024 1521   HCT 45.6 02/07/2024 0950   PLT 278 02/24/2024 1515   PLT 269 02/07/2024 0950   MCV 86.3 02/24/2024 1515   MCV 88 02/07/2024 0950   MCH 29.0 02/24/2024 1515   MCHC 33.6 02/24/2024 1515   RDW 13.0 02/24/2024 1515   RDW 13.1 02/07/2024 0950   LYMPHSABS 2.1 02/24/2024 1515   LYMPHSABS 2.0 02/07/2024 0950   MONOABS 0.8 02/24/2024 1515   EOSABS 0.1 02/24/2024 1515   EOSABS 0.0 02/07/2024 0950   BASOSABS 0.0 02/24/2024 1515   BASOSABS 0.0 02/07/2024 0950    No results found for: POCLITH, LITHIUM   No results found for: PHENYTOIN, PHENOBARB, VALPROATE, CBMZ   .res Assessment: Plan:    Plan:  PDMP reviewed  Ativan  0.5mg  BID prn anxiety - not taking daily Prozac  40mg  daily Trazadone 100mg  at hs  Genesight testing on file.  RTC 1 to 2 months   25 minutes spent dedicated to the care of this patient on the date of this encounter to include pre-visit review of records,  ordering of medication, post visit documentation, and face-to-face time with the patient discussing GAD, OCD, panic disorder and insomnia. Discussed continuing current medication  regimen.  Patient advised to contact office with any questions, adverse effects, or acute worsening in signs and symptoms.  Discussed potential benefits, risk, and side effects of benzodiazepines to include potential risk of tolerance and dependence, as well as possible drowsiness.  Advised patient not to drive if experiencing drowsiness and to take lowest possible effective dose to minimize risk of dependence and tolerance.    There are no diagnoses linked to this encounter.   Please see After Visit Summary for patient specific instructions.  Future Appointments  Date Time Provider Department Center  04/12/2024  9:00 AM Cottle, Bambi G, LCSW [facility] None  04/13/2024  8:00 AM Plaster, Marlon BRAVO, PT [facility]  04/16/2024 12:30 PM Abelardo Burnard PARAS, OT [facility]  04/16/2024  1:15 PM Plaster, Marlon BRAVO, PT [facility]  04/18/2024  9:30 AM Vicci Comer FERNS, CCC-SLP [facility]  04/18/2024 10:15 AM Dorise Clarita CROME, OT [facility]  04/18/2024 11:00 AM Plaster, Marlon BRAVO, PT [facility]  04/19/2024  9:45 AM Kirsteins, Prentice BRAVO, MD [facility] CPR  04/23/2024  8:45 AM [facility] LAB [facility] PEC  04/23/2024 10:15 AM Vicci Comer FERNS, CCC-SLP [facility]  04/23/2024 11:00 AM Plaster, Marlon BRAVO, PT [facility]  04/23/2024 11:45 AM Manny Jocelyn HERO, OT [facility]  04/24/2024 10:00 AM Cottle, Peggye MATSU, LCSW [facility] None  04/25/2024 10:15 AM Manny Jocelyn HERO, OT [facility]  04/25/2024 11:00 AM Plaster, Marlon BRAVO, PT [facility]  05/01/2024 11:00 AM Debby Harlene CROME, CCC-SLP [facility]  05/08/2024 10:00 AM Cottle, Peggye MATSU, LCSW [facility] None  05/15/2024 11:00 AM Debby Harlene CROME, CCC-SLP [facility]  05/22/2024 10:00 AM Cottle, Bambi G, LCSW [facility] None  06/05/2024 10:00 AM Cottle, Bambi G, LCSW [facility] None  06/25/2024  3:30 PM Lomax, Amy, NP [facility] None  07/10/2024  3:30 PM Ines Onetha NOVAK, MD [facility] None  11/26/2024 10:30 AM Lomax, Amy, NP [facility] None    No orders of the  defined types were placed in this encounter.     -------------------------------

## 2024-04-10 NOTE — Telephone Encounter (Signed)
 Submitted auth request via CMM, status is pending. Key: [id number]

## 2024-04-11 ENCOUNTER — Encounter: Admitting: Occupational Therapy

## 2024-04-11 ENCOUNTER — Ambulatory Visit: Admitting: Physical Therapy

## 2024-04-11 ENCOUNTER — Other Ambulatory Visit: Payer: Self-pay

## 2024-04-11 DIAGNOSIS — M62838 Other muscle spasm: Secondary | ICD-10-CM | POA: Diagnosis not present

## 2024-04-11 DIAGNOSIS — G9389 Other specified disorders of brain: Secondary | ICD-10-CM | POA: Diagnosis not present

## 2024-04-11 DIAGNOSIS — I6930 Unspecified sequelae of cerebral infarction: Secondary | ICD-10-CM | POA: Diagnosis not present

## 2024-04-11 DIAGNOSIS — Z1331 Encounter for screening for depression: Secondary | ICD-10-CM | POA: Diagnosis not present

## 2024-04-11 DIAGNOSIS — G90A Postural orthostatic tachycardia syndrome (POTS): Secondary | ICD-10-CM | POA: Diagnosis not present

## 2024-04-11 LAB — LIPID PANEL
Cholesterol: 103 (ref 0–200)
HDL: 37 (ref 35–70)
LDL Cholesterol: 54
Triglycerides: 105 (ref 40–160)

## 2024-04-11 MED ORDER — ATORVASTATIN CALCIUM 10 MG PO TABS
10.0000 mg | ORAL_TABLET | Freq: Every day | ORAL | 3 refills | Status: DC
  Filled 2024-04-11: qty 90, 90d supply, fill #0
  Filled 2024-05-24: qty 90, 90d supply, fill #1

## 2024-04-12 ENCOUNTER — Ambulatory Visit: Admitting: Psychology

## 2024-04-12 DIAGNOSIS — F331 Major depressive disorder, recurrent, moderate: Secondary | ICD-10-CM | POA: Diagnosis not present

## 2024-04-12 DIAGNOSIS — F411 Generalized anxiety disorder: Secondary | ICD-10-CM

## 2024-04-12 NOTE — Progress Notes (Signed)
 [facility] Counselor/Therapist Progress Note  Patient ID: Faith Jordan, MRN: [id number],    Date:  04/12/2024  Time Spent: 52 minutes  Time in:9:04  Time out: 9:56  Treatment Type:- The patient was seen via video visit.  She gave verbal consent for the session to be on video on caregility.  The patient was in her home alone and therapist was in the office.  Reported Symptoms: anxiety, panic attacks  Mental Status Exam: Appearance:  Casual     Behavior: Appropriate  Motor: Normal  Speech/Language:  Normal Rate  Affect: flat  Mood: Sad and tearful  Thought process: normal  Thought content:   WNL  Sensory/Perceptual disturbances:   WNL  Orientation: oriented to person, place, time/date, and situation  Attention: Good  Concentration: Good  Memory: WNL  Fund of knowledge:  Good  Insight:   Fair  Judgment:  Good  Impulse Control: Good   Risk Assessment: Danger to Self:  No Self-injurious Behavior: No Danger to Others: No Duty to Warn:no Physical Aggression / Violence:No  Access to Firearms a concern: No  Gang Involvement:No   Subjective: The patient was seen for an individual therapy session  via video visit today.  The patient gave verbal permission for the session to be on video on caregility and she is aware of the limitations of telehealth.  The patient was in her home alone and therapist was in the office.  The patient presents as sad and depressed today.  The patient had a hard time talking in the beginning.  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  She also is being to refer to a cardiologist at [facility].  The patient was supposed to go back to work on July 10 but with this new development as possible that she may not be able to return  right now.  She was very tearful throughout the whole session and I do believe that she is depressed.  We will continue to monitor the situation to see if we need to interact with her medication provider and encourage increase in antidepressant medication as she is struggling with the news.  We also talked about her not catastrophizing and trying to stay in the present moment.  She is worried that she will not have a relationship or be able to have children at any point in her life.  Provided supportive therapy and did some reframing.  interventions: Cognitive Behavioral Therapy and Assertiveness/Communication,, problem solving, psychoeducation,  EMDR as indicated, Meditation and mindfulness,   Diagnosis:GAD (generalized anxiety disorder)  Major depressive disorder, recurrent episode, moderate (HCC)  Plan: Client Abilities/Strengths  Intelligent, insightful, motivated  Client Treatment Preferences  Outpatient Individual therapy every other week  Client Statement of Needs   I feel better now, but still need some help with anxiety  Treatment Level  Outpatient Individual therapy  Symptoms  Frustration and anxiety related to providing oversight and caretaking to an aging, ailing, and dependent  parent.: No Description Entered (Status: improved). Hypervigilance (e.g., feeling constantly on edge,  experiencing concentration difficulties, having trouble falling or staying asleep, exhibiting a general  state of irritability).: No Description Entered (Status: improved). Motor tension (e.g., restlessness,  tiredness, shakiness, muscle tension).: No Description Entered (Status: improved).  Problems Addressed  Anxiety, Phase Of Life Problems, Anxiety  Goals 1. Learn and implement coping skills that result in a reduction of anxiety  and worry, and improved daily functioning. Objective Learn and implement calming skills to reduce overall anxiety and manage anxiety symptoms. Target Date:  2025-08-09Frequency: Weekly Progress: 40 Modality: individual  Related Interventions 1. Teach the client calming/relaxation skills (e.g., applied relaxation, progressive muscle  relaxation, cue controlled relaxation; mindful breathing; biofeedback) and how to discriminate  better between relaxation and tension; teach the client how to apply these skills to his/her daily  life (e.g., New Directions in Progressive Muscle Relaxation by Thornell Collier, and  Hazlett-Stevens; Treating Generalized Anxiety Disorder by Rygh and Red). Objective Identify, challenge, and replace biased, fearful self-talk with positive, realistic, and empowering selftalk. Target Date: 2024-05-26 Frequency: weekly Progress: 30 Modality: individual Related Interventions 1. Explore the client's schema and self-talk that mediate his/her fear response; assist him/her in  challenging the biases; replace the distorted messages with reality-based alternatives and  positive, realistic self-talk that will increase his/her self-confidence in coping with irrational  fears (see Cognitive Therapy of Anxiety Disorders by Gretta armin Mon). Objective Learn and implement problem-solving strategies for realistically addressing worries. Target Date: 2025-08-09Frequency: weekly Progress: 40 Modality: individual 2. Resolve conflicted feelings and adapt to the new life circumstances. Objective Apply problem-solving skills to current circumstances. Target Date: 2024-05-26 Frequency: weekly Progress: 20 Modality: individual Related Interventions 1. Teach the client problem-resolution skills (e.g., defining the problem clearly, brainstorming  multiple solutions, listing the pros and cons of each solution, seeking input from others,  selecting and implementing a plan of action, evaluating outcome, and readjusting plan as  necessary).   3. Stabilize anxiety level while increasing ability to function on a daily   basis. Diagnosis F33.1  Major depressive disorder, moderate 300.02 (Generalized anxiety disorder) - Open - [Signifier: n/a]  Axis  none 309.28 (Adjustment disorder with mixed anxiety and depressed  mood) - Open - [Signifier: n/a]  Adjustment Disorder,  With Anxiety   Marital conflict  Major Depressive disorder, moderate  Conditions For Discharge Achievement of treatment goals and objectives.  The patient approved this plan.   Rochel Privett G Emmali Karow, LCSW

## 2024-04-12 NOTE — Telephone Encounter (Signed)
 Kelli, can we abstract the attached labs?

## 2024-04-12 NOTE — Telephone Encounter (Signed)
 Labs abstracted

## 2024-04-13 ENCOUNTER — Ambulatory Visit: Payer: Self-pay | Admitting: Physical Therapy

## 2024-04-13 DIAGNOSIS — R278 Other lack of coordination: Secondary | ICD-10-CM | POA: Diagnosis not present

## 2024-04-13 DIAGNOSIS — R2689 Other abnormalities of gait and mobility: Secondary | ICD-10-CM

## 2024-04-13 DIAGNOSIS — R2681 Unsteadiness on feet: Secondary | ICD-10-CM | POA: Diagnosis not present

## 2024-04-13 DIAGNOSIS — I69354 Hemiplegia and hemiparesis following cerebral infarction affecting left non-dominant side: Secondary | ICD-10-CM | POA: Diagnosis not present

## 2024-04-13 DIAGNOSIS — R41841 Cognitive communication deficit: Secondary | ICD-10-CM | POA: Diagnosis not present

## 2024-04-13 DIAGNOSIS — M5459 Other low back pain: Secondary | ICD-10-CM | POA: Diagnosis not present

## 2024-04-13 DIAGNOSIS — R29898 Other symptoms and signs involving the musculoskeletal system: Secondary | ICD-10-CM | POA: Diagnosis not present

## 2024-04-13 DIAGNOSIS — M6281 Muscle weakness (generalized): Secondary | ICD-10-CM

## 2024-04-13 DIAGNOSIS — I6381 Other cerebral infarction due to occlusion or stenosis of small artery: Secondary | ICD-10-CM | POA: Diagnosis not present

## 2024-04-13 DIAGNOSIS — R29818 Other symptoms and signs involving the nervous system: Secondary | ICD-10-CM | POA: Diagnosis not present

## 2024-04-13 NOTE — Therapy (Signed)
 OUTPATIENT PHYSICAL THERAPY NEURO TREATMENT    Patient Name: Faith Jordan MRN: [id number] DOB:February 08, 1996, 28 y.o., female Today's Date: 04/13/2024   PCP: Gretta Comer POUR, NP REFERRING PROVIDER: Carilyn Prentice BRAVO, MD   END OF SESSION:  PT End of Session - 04/13/24 0805     Visit Number 21    Number of Visits 25   recert   Date for PT Re-Evaluation 05/10/24   recert, to allow for scheduling delays   Authorization Type [employer]    PT Start Time 9195    PT Stop Time [phone]    PT Time Calculation (min) 39 min    Equipment Utilized During Treatment --    Activity Tolerance Patient tolerated treatment well    Behavior During Therapy [facility] for tasks assessed/performed                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   Acute flank pain 06/02/2022   Angio-edema 04/27/2016   Angioedema    Aquagenic angio-edema-urticaria    Asthma    no problems /not used recently   Dysautonomia (HCC)    Dysrhythmia    sinus tachycardia   Fibroid    right breast, adenoma   Headache(784.0)    History of COVID-19 11/21/2020   Hives 12/22/2015   Pneumonia    hx  6th grade   Sensation of fullness in both ears 02/02/2023   Snoring 02/11/2022   Urinary frequency 02/12/2019   Vaginal yeast infection 06/02/2022   Vasculitis (HCC)    Past Surgical History:  Procedure Laterality Date   ADENOIDECTOMY     BREAST LUMPECTOMY Right    BREAST LUMPECTOMY Right    MASS EXCISION Right 10/07/2014   Procedure: EXCISION OF RIGHT BREAST MASS;  Surgeon: Krystal Russell, MD;  Location: [facility] OR;  Service: General;  Laterality: Right;   TONSILLECTOMY AND ADENOIDECTOMY     TOOTH EXTRACTION     TRANSESOPHAGEAL ECHOCARDIOGRAM (CATH LAB) N/A 01/10/2024   Procedure: TRANSESOPHAGEAL ECHOCARDIOGRAM;  Surgeon: Jeffrie Oneil BROCKS, MD;  Location: [facility] INVASIVE CV LAB;  Service: Cardiovascular;  Laterality: N/A;   Patient Active Problem List   Diagnosis  Date Noted   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) 04/09/2024   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 (CVA) 04/05/2024   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) 02/10/2024   Coping style affecting medical condition 01/19/2024   History of CVA (cerebrovascular accident) 01/12/2024   Stroke of right basal ganglia (HCC) 01/07/2024   Non-restorative sleep 05/26/2022   Sleep paralysis, 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   Vivid dream 05/26/2022   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   Excessive daytime sleepiness 05/26/2022   Sleep disturbance 03/17/2022   Panic attacks 02/11/2022   Vitamin B12 deficiency 07/30/2021   Vitamin D  deficiency 07/30/2021   Family history of hypothyroidism 07/30/2021   GAD (generalized anxiety disorder) 03/18/2020   Fatigue 11/06/2019   Chronic back pain 07/06/2019   Preventative health care 04/27/2018   Migraines 04/25/2018   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   Anemia 01/04/2018   Headache disorder 12/29/2015   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   Sinus tachycardia 12/22/2015   Dysautonomia (HCC) 12/22/2015   Dysautonomia, familial (HCC) 04/22/2015   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   Neurocardiogenic syncope 04/06/2013   Intermittent palpitations 04/06/2013    ONSET DATE: 01/26/2024 (referral)   REFERRING DIAG:  I63.81 (ICD-10-CM) - Other cerebral infarction due to occlusion or stenosis of small artery    THERAPY DIAG:  Muscle weakness (generalized)  Unsteadiness on feet  Other abnormalities of gait and mobility  Rationale for Evaluation and Treatment: Rehabilitation  SUBJECTIVE:                                                                                                                                                                                             SUBJECTIVE STATEMENT: Pt presents to clinic  alone. Denies pain or falls. States she is being referred to a neurodegenerative specialist due to concerns of Fahr's Disease. Is waiting to have MD read her echo because she suspects a PFO.   Pt accompanied by: self  PERTINENT HISTORY: familiar hemiplegic migraine, angioedema 2017,dysautonomia; infarct right internal capsule/ R basal ganglia stroke  PAIN:  Are you having pain? No    PRECAUTIONS: Fall  RED FLAGS: None   WEIGHT BEARING RESTRICTIONS: No  FALLS: Has patient fallen in last 6 months? No  LIVING ENVIRONMENT: Lives with: lives with their family Lives in: House/apartment Stairs: Yes: External: 4 steps; bilateral but cannot reach both Has following equipment at home: shower chair  PLOF: Independent  PATIENT GOALS: Being normal again. Being able to go back to work and be independent   OBJECTIVE:  Note: Objective measures were completed at Evaluation unless otherwise noted.  DIAGNOSTIC FINDINGS: MRI of brain on 01/07/24  IMPRESSION: 1. 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 2. Susceptibility related calcifications in the globus pallidus bilaterally. This likely reflects the sequela of chronic microvascular ischemia.  COGNITION: Overall cognitive status: Within functional limits for tasks assessed   SENSATION: Pt denies numbness/tingling in L hemibody   MUSCLE TONE: Non-fatiguing clonus of LLE  POSTURE: No Significant postural limitations  LOWER EXTREMITY ROM:     Active  Right Eval Left Eval  Hip flexion    Hip extension    Hip abduction    Hip adduction    Hip internal rotation    Hip external rotation    Knee flexion    Knee extension    Ankle dorsiflexion    Ankle plantarflexion    Ankle inversion    Ankle eversion     (Blank rows = not tested)  LOWER EXTREMITY MMT:  Tested in seated position   MMT Right Eval Left Eval  Hip flexion 5 4+  Hip extension    Hip abduction 5 5  Hip  adduction 5 5  Hip internal rotation    Hip external rotation    Knee flexion  5 5  Knee extension 5 4+  Ankle dorsiflexion 5 5  Ankle plantarflexion    Ankle inversion    Ankle eversion    (Blank rows = not tested)  BED MOBILITY:  Not tested  TRANSFERS: Sit to stand: Complete Independence  Assistive device utilized: None     Stand to sit: Complete Independence  Assistive device utilized: None      RAMP:  Not tested  CURB:  Not tested  GAIT: Gait pattern: step through pattern, decreased arm swing- Left, decreased step length- Right, decreased stance time- Left, decreased stride length, decreased hip/knee flexion- Left, lateral hip instability, and wide BOS Distance walked: Various clinic distances  Assistive device utilized: None Level of assistance: Modified independence Comments: Pt walks very guarded, as if she feels unstable. No instability noted other than single instance of L knee buckling which pt able to self-correct.    PATIENT SURVEYS:  ABC scale to be obtained    VITALS  There were no vitals filed for this visit.                                                                                                                              TREATMENT:   Self-Care  Discussed recent [facility] visit and provided therapeutic listening as pt emotional. Reviewed previous head CTs and pt verbalized frustration with having history of calcification dating back to 2006. Provided encouragement as appropriate regarding how far pt has come in her rehab and how well she is doing. Pt verbalized appreciation.   NMR 10# slam balls, x20 reps, for improved high amplitude movement, stability w/deep squat and functional strength. Pt performed well w/no instability noted.     PATIENT EDUCATION: Education details: continue HEP Person educated: Patient Education method: Medical illustrator Education comprehension: verbalized understanding and returned demonstration  HOME  EXERCISE PROGRAM: Access Code: [email or web address] URL: [email or web address] Date: 02/13/2024 Prepared by: Marlon Saurav Crumble  Exercises - Half Kneeling Lift  - 1 x daily - 7 x weekly - 1 sets - 10 reps - Down Dog with Bent Knees and Heels on Mat  - 1 x daily - 7 x weekly - 2-3 reps - 15-30 seconds hold - Bird Dog  - 1 x daily - 7 x weekly - 3 sets - 10 reps - Lateral Shuffles  - 1 x daily - 7 x weekly - 3 sets - 10 reps - Braided Sidestepping  - 1 x daily - 7 x weekly - 3 sets - 10 reps - Standing Balance Activity: Plyometric Modified Lower Extremity Jumping Jack  - 1 x daily - 7 x weekly - 3 sets - 10 reps - Standing Forward Step Taps with Counter Support  - 1 x daily - 7 x weekly - 3 sets - 10 reps (at bottom of steps with no UE support, red TB around ankles) - Ankle Inversion with Resistance  - 1 x daily - 7 x weekly - 3 sets -  10 reps - Modified Thomas Stretch  - 1 x daily - 7 x weekly - 2-3 sets - 1-2 minutes  hold - Supine Straight Leg Lumbar Rotation Stretch  - 1 x daily - 7 x weekly - 3 sets - 10 reps - Psoas Mobilization with Small Ball  - 1 x daily - 7 x weekly - 3 sets - 2-3 minute hold - Pigeon Pose  - 1 x daily - 7 x weekly - 1-2 sets - 1-2 minutes hold  From CIR Therapist: Also instructed pt in 2 additional exercises but no printed versions available: - tall kneeling to half kneeling - sit to half kneeling and then return to sit. Perform to each side.   Verbally added 02/21/24: -squats at countertop with chair behind you  Verbally added 03/13/24: - eccentric heel taps  - jumping over line fwd/retro/laterally   Verbally added 03/27/24  - Pallof press rotations w/red theraband   GOALS: Goals reviewed with patient? Yes  SHORT TERM GOALS: Target date: 02/28/2024  Pt will improve FGA to 28/30 for decreased fall risk  Baseline: 21/30, 25/30 (5/13) Goal status: IN PROGRESS  2.  HiMat to be assessed at STG check and LTG updated  Baseline: 11/54 (5/13) Goal status:  MET  3.  Pt will improve gait velocity to at least 3.4 ft/s for improved gait efficiency and return to PLOF Baseline: 3.0 ft/sec (eval), 3.31 ft/sec (5/13) Goal status: IN PROGRESS  4.  ABC scale to be assessed and LTG updated  Baseline: 70.6% (5/13) Goal status: MET  5.  Pt will demonstrate proper lifting technique of 25# weight from floor for proper body mechanics and safety at work Baseline:  Goal status: MET  LONG TERM GOALS: Target date: 03/13/2024  Pt will improve gait velocity to at least 3.5 ft/s for improved gait efficiency and independence Baseline: 3.0 ft/sec (eval), 3.31 ft/sec (5/13), 3.57 ft/sec (5/29) Goal status: MET  2.  Pt will improve her score on the HiMat to >/= 16/54 to demonstrate improved balance and function Baseline: 11/54 (5/13), 21/54 (5/29) Goal status: MET  3.  Pt will improve her ABC scale score to >/= 80% for improved confidence in her balance. Baseline: 70.6% (5/13), 90% (5/29) Goal status: MET  4.  Pt will be able to carry 10# object in BUEs up/down 4 steps for improved coordination and safety in work environment   Baseline: can only carry w/single UE, can carry with BUE mod I (5/29) Goal status: MET  5.  Pt will perform [place] get up w/5# weight for improved shoulder stability OH, core stability and high level balance  Baseline: CGA to min A to perform (5/29) Goal status: MET  NEW SHORT TERM GOALS:   Target date: 04/05/2024  Pt will improve her score on the HiMat to >/= 26/54 to demonstrate improved balance and function Baseline: 11/54 (5/13), 21/54 (5/29), 13/54 (6/23) Goal status: NOT MET  2.  Pt will be able to perform 5 deep squats and return to standing to demonstrate improved function Baseline: 2 squats before unable to stand up without physical assist, 5 squats mod I (6/23) Goal status: MET  3.  Manual TUG to be assessed and LTG set Baseline: to be assessed Goal status: MET  4.  Cognitive TUG to be assessed and LTG  set Baseline: to be assessed Goal status: MET    NEW LONG TERM GOALS:  Target date: 04/26/2024  Pt will improve gait velocity to at least 3.75 ft/s for improved  gait efficiency and independence Baseline: 3.0 ft/sec (eval), 3.31 ft/sec (5/13), 3.57 ft/sec (5/29) Goal status: INITIAL  2.  Pt will improve her score on the HiMat to >/= 25/54 to demonstrate improved balance and function Baseline: 11/54 (5/13), 21/54 (5/29), 13/54 (6/23) Goal status: REVISED  3.  Pt will be able to perform 10 deep squats and return to standing to demonstrate improved function Baseline: 2 squats before unable to stand up without physical assist Goal status: INITIAL  4.  Pt will improve manual TUG to less than or equal to 12 seconds for improved functional mobility and decreased fall risk.  Baseline: 17.59s Goal status: INITIAL  5.  Pt will improve cognitive TUG to less than or equal to 10 seconds for improved functional mobility and decreased fall risk.  Baseline: 12.35s counting backwards by 8 Goal status: INITIAL      ASSESSMENT:  CLINICAL IMPRESSION: Emphasis of skilled PT session on providing therapeutic listening as pt had appointment with neurologist at [facility] this week. Pt emotional this date over potential diagnosis of Fahr's Disease, so majority of session spent discussing this and implications it would have on mobility. Provided encouragement regarding how far pt has come in therapy so far and focusing on what she is able to do. Pt verbalized understanding and appreciation. Continue POC.   OBJECTIVE IMPAIRMENTS: Abnormal gait, decreased activity tolerance, decreased balance, decreased coordination, decreased endurance, decreased mobility, decreased strength, increased muscle spasms, impaired UE functional use, improper body mechanics, and pain  ACTIVITY LIMITATIONS: carrying, lifting, squatting, stairs, reach over head, hygiene/grooming, and locomotion level  PARTICIPATION LIMITATIONS:  meal prep, cleaning, laundry, interpersonal relationship, driving, shopping, community activity, occupation, and yard work  PERSONAL FACTORS: 1 comorbidity: R internal capsule infarct/R basal ganglia stroke are also affecting patient's functional outcome.   REHAB POTENTIAL: Excellent  CLINICAL DECISION MAKING: Evolving/moderate complexity  EVALUATION COMPLEXITY: Moderate  PLAN:  PT FREQUENCY: 2-3x/week (starting w/2x following eval)  PT DURATION: 6 weeks  PLANNED INTERVENTIONS: 02835- PT Re-evaluation, 97750- Physical Performance Testing, 97110-Therapeutic exercises, 97530- Therapeutic activity, [id number]- Neuromuscular re-education, 97535- Self Care, 02859- Manual therapy, [phone]- Gait training, [phone]- Canalith repositioning, [phone]- Aquatic Therapy, Patient/Family education, Balance training, Stair training, Taping, Dry Needling, Joint mobilization, Spinal mobilization, Vestibular training, and DME instructions  PLAN FOR NEXT SESSION:  High level agility, elliptical, L quad eccentric strength, OH tasks. Practice w/plastic needles Marvetta has these) in oranges and pin the needle to targets? Cognitive dual tasking, needs to work on deep squats and return to standing from squats, agility drills, treadmill training with focus on increasing speed and potentially running? Half kneel, ladder drills, carrying weight with both hands (on rockerboard, stairs), agility ladder. Jumping. Wobble bar, standing hip half circles on airex   Pt goals: squats (for patient care), time based tasks, not lose my balance (would lose balance with perturbations/reactive balance), multitasking/cog dual tasking (talking while doing tasks). Work simulation tasks (bed mobility, transfers, holding items)   Janah Mcculloh E Spiro Ausborn, PT, DPT  04/13/2024, 8:44 AM

## 2024-04-16 ENCOUNTER — Other Ambulatory Visit: Payer: Self-pay

## 2024-04-16 ENCOUNTER — Ambulatory Visit: Admitting: Occupational Therapy

## 2024-04-16 ENCOUNTER — Other Ambulatory Visit (HOSPITAL_COMMUNITY): Payer: Self-pay

## 2024-04-16 ENCOUNTER — Ambulatory Visit: Admitting: Physical Therapy

## 2024-04-16 DIAGNOSIS — R2681 Unsteadiness on feet: Secondary | ICD-10-CM

## 2024-04-16 DIAGNOSIS — R29898 Other symptoms and signs involving the musculoskeletal system: Secondary | ICD-10-CM

## 2024-04-16 DIAGNOSIS — R29818 Other symptoms and signs involving the nervous system: Secondary | ICD-10-CM

## 2024-04-16 DIAGNOSIS — I6381 Other cerebral infarction due to occlusion or stenosis of small artery: Secondary | ICD-10-CM | POA: Diagnosis not present

## 2024-04-16 DIAGNOSIS — R41841 Cognitive communication deficit: Secondary | ICD-10-CM | POA: Diagnosis not present

## 2024-04-16 DIAGNOSIS — I69354 Hemiplegia and hemiparesis following cerebral infarction affecting left non-dominant side: Secondary | ICD-10-CM | POA: Diagnosis not present

## 2024-04-16 DIAGNOSIS — R2689 Other abnormalities of gait and mobility: Secondary | ICD-10-CM | POA: Diagnosis not present

## 2024-04-16 DIAGNOSIS — M5459 Other low back pain: Secondary | ICD-10-CM | POA: Diagnosis not present

## 2024-04-16 DIAGNOSIS — M6281 Muscle weakness (generalized): Secondary | ICD-10-CM | POA: Diagnosis not present

## 2024-04-16 DIAGNOSIS — R278 Other lack of coordination: Secondary | ICD-10-CM

## 2024-04-16 MED ORDER — INCOBOTULINUMTOXINA 100 UNITS IM SOLR
300.0000 [IU] | Freq: Once | INTRAMUSCULAR | 0 refills | Status: AC
  Filled 2024-04-30: qty 3, 1d supply, fill #0

## 2024-04-16 NOTE — Therapy (Signed)
 OUTPATIENT PHYSICAL THERAPY NEURO TREATMENT    Patient Name: Faith Jordan MRN: [id number] DOB:[birth year], 28 y.o., female Today's Date: 04/16/2024   PCP: Gretta Comer POUR, NP REFERRING PROVIDER: Carilyn Prentice BRAVO, MD   END OF SESSION:  PT End of Session - 04/16/24 1318     Visit Number 22    Number of Visits 25   recert   Date for PT Re-Evaluation 05/10/24   recert, to allow for scheduling delays   Authorization Type [employer]    PT Start Time 1317    PT Stop Time 1358    PT Time Calculation (min) 41 min    Activity Tolerance Patient tolerated treatment well    Behavior During Therapy [facility] for tasks assessed/performed                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   Acute flank pain 06/02/2022   Angio-edema 04/27/2016   Angioedema    Aquagenic angio-edema-urticaria    Asthma    no problems /not used recently   Dysautonomia (HCC)    Dysrhythmia    sinus tachycardia   Fibroid    right breast, adenoma   Headache(784.0)    History of COVID-19 11/21/2020   Hives 12/22/2015   Pneumonia    hx  6th grade   Sensation of fullness in both ears 02/02/2023   Snoring 02/11/2022   Urinary frequency 02/12/2019   Vaginal yeast infection 06/02/2022   Vasculitis (HCC)    Past Surgical History:  Procedure Laterality Date   ADENOIDECTOMY     BREAST LUMPECTOMY Right    BREAST LUMPECTOMY Right    MASS EXCISION Right 10/07/2014   Procedure: EXCISION OF RIGHT BREAST MASS;  Surgeon: Krystal Russell, MD;  Location: [facility] OR;  Service: General;  Laterality: Right;   TONSILLECTOMY AND ADENOIDECTOMY     TOOTH EXTRACTION     TRANSESOPHAGEAL ECHOCARDIOGRAM (CATH LAB) N/A 01/10/2024   Procedure: TRANSESOPHAGEAL ECHOCARDIOGRAM;  Surgeon: Jeffrie Oneil BROCKS, MD;  Location: [facility] INVASIVE CV LAB;  Service: Cardiovascular;  Laterality: N/A;   Patient Active Problem List   Diagnosis Date Noted   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) 04/09/2024   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 (CVA) 04/05/2024   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) 02/10/2024   Coping style affecting medical condition 01/19/2024   History of CVA (cerebrovascular accident) 01/12/2024   Stroke of right basal ganglia (HCC) 01/07/2024   Non-restorative sleep 05/26/2022   Sleep paralysis, 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   Vivid dream 05/26/2022   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   Excessive daytime sleepiness 05/26/2022   Sleep disturbance 03/17/2022   Panic attacks 02/11/2022   Vitamin B12 deficiency 07/30/2021   Vitamin D  deficiency 07/30/2021   Family history of hypothyroidism 07/30/2021   GAD (generalized anxiety disorder) 03/18/2020   Fatigue 11/06/2019   Chronic back pain 07/06/2019   Preventative health care 04/27/2018   Migraines 04/25/2018   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   Anemia 01/04/2018   Headache disorder 12/29/2015   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   Sinus tachycardia 12/22/2015   Dysautonomia (HCC) 12/22/2015   Dysautonomia, familial (HCC) 04/22/2015   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   Neurocardiogenic syncope 04/06/2013   Intermittent palpitations 04/06/2013    ONSET DATE: 01/26/2024 (referral)   REFERRING DIAG:  I63.81 (ICD-10-CM) - Other cerebral infarction due  to occlusion or stenosis of small artery    THERAPY DIAG:  Muscle weakness (generalized)  Unsteadiness on feet  Other lack of coordination  Rationale for Evaluation and Treatment: Rehabilitation  SUBJECTIVE:                                                                                                                                                                                             SUBJECTIVE STATEMENT: Pt presents to clinic alone. Denies pain or falls. Reports she does have a hole in  her heart and is scheduled for a TEE this Wednesday. Is also scheduled for botox  in her lower leg in a few weeks, was not told where they would botox . No falls. Is up to walking for >20 minutes before her left leg starts to give her issues.   Pt accompanied by: self  PERTINENT HISTORY: familiar hemiplegic migraine, angioedema 2017,dysautonomia; infarct right internal capsule/ R basal ganglia stroke  PAIN:  Are you having pain? No    PRECAUTIONS: Fall  RED FLAGS: None   WEIGHT BEARING RESTRICTIONS: No  FALLS: Has patient fallen in last 6 months? No  LIVING ENVIRONMENT: Lives with: lives with their family Lives in: House/apartment Stairs: Yes: External: 4 steps; bilateral but cannot reach both Has following equipment at home: shower chair  PLOF: Independent  PATIENT GOALS: Being normal again. Being able to go back to work and be independent   OBJECTIVE:  Note: Objective measures were completed at Evaluation unless otherwise noted.  DIAGNOSTIC FINDINGS: MRI of brain on 01/07/24  IMPRESSION: 1. 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 2. Susceptibility related calcifications in the globus pallidus bilaterally. This likely reflects the sequela of chronic microvascular ischemia.  COGNITION: Overall cognitive status: Within functional limits for tasks assessed   SENSATION: Pt denies numbness/tingling in L hemibody   MUSCLE TONE: Non-fatiguing clonus of LLE  POSTURE: No Significant postural limitations  LOWER EXTREMITY ROM:     Active  Right Eval Left Eval  Hip flexion    Hip extension    Hip abduction    Hip adduction    Hip internal rotation    Hip external rotation    Knee flexion    Knee extension    Ankle dorsiflexion    Ankle plantarflexion    Ankle inversion    Ankle eversion     (Blank rows = not tested)  LOWER EXTREMITY MMT:  Tested in seated position   MMT Right Eval Left Eval  Hip flexion 5 4+   Hip extension    Hip abduction 5 5  Hip  adduction 5 5  Hip internal rotation    Hip external rotation    Knee flexion 5 5  Knee extension 5 4+  Ankle dorsiflexion 5 5  Ankle plantarflexion    Ankle inversion    Ankle eversion    (Blank rows = not tested)  BED MOBILITY:  Not tested  TRANSFERS: Sit to stand: Complete Independence  Assistive device utilized: None     Stand to sit: Complete Independence  Assistive device utilized: None      RAMP:  Not tested  CURB:  Not tested  GAIT: Gait pattern: step through pattern, decreased arm swing- Left, decreased step length- Right, decreased stance time- Left, decreased stride length, decreased hip/knee flexion- Left, lateral hip instability, and wide BOS Distance walked: Various clinic distances  Assistive device utilized: None Level of assistance: Modified independence Comments: Pt walks very guarded, as if she feels unstable. No instability noted other than single instance of L knee buckling which pt able to self-correct.    PATIENT SURVEYS:  ABC scale to be obtained    VITALS  There were no vitals filed for this visit.                                                                                                                              TREATMENT:   NMR/Self-care  Pt performed floor transfer mod I and the following were performed for improved proximal strength, core stability and hip flexor strength:  In tall kneel on bosu (blue side): Mini squats x15 reps. Pt reported feeling a burn around rep 7. SBA for safety  4 Blaze pods on random reach setting performed on 1.5 minute intervals with 1 minute rest periods.  Pt requires SBA guarding. Round 1:  pods placed from 9-3 o'clock in front of pt.  89 hits while pt maintained heel sit position on bosu. Round 2:  same setup but cued pt to perform mini squat (fully extend hips) in between each pod tap.  29 hits. Much more challenging for pt Supine psoas marches w/red  theraband around feet, x4 reps per side. Due to significant extensor tone in LLE, pt unable to flex L knee, so halted activity. Attempted to assist w/flexing pt's knee, but tone at a 3 on MAS and would only reduce w/rest.  Pt performed tall kneel on floor mat to stand mod I On mat table, long sit psoas march overs using 3# DB, 2x15 reps per side, for improved hip flexor strength in shortened position. Pt's tone significantly increased w/movement but pt able to perform as she could keep her knees extended. Encouraged pt to perform this at home, pt verbalized understanding.   Gait pattern: L knee extension thrust and step through pattern Distance walked: 230' plus various clinic distances  Assistive device utilized: None Level of assistance: Modified independence Comments: Assessed LLE as pt ambulated two laps around the gym while fatigued. On lap one, noted significant knee extension thrust  on LLE but pt not hyperextending. On second lap, pt w/reduced thrust and reports this is due to her landing on her midfoot vs heel w/IC. Pt already has heel wedge in her shoe to assist w/knee thrust and since pt is not hyperextending, therapist not super concerned for it at this time,   Discussed upcoming appointments with Dr. Lanny and recommended pt ask about Xeomin injection in her leg to be safe. Informed pt that therapist has not heard back from Dr. Ines regarding clarification on inversion vs eversion of L ankle.      PATIENT EDUCATION: Education details: continue HEP Person educated: Patient Education method: Medical illustrator Education comprehension: verbalized understanding and returned demonstration  HOME EXERCISE PROGRAM: Access Code: [email or web address] URL: [email or web address] Date: 02/13/2024 Prepared by: Marlon Deandrea Vanpelt  Exercises - Half Kneeling Lift  - 1 x daily - 7 x weekly - 1 sets - 10 reps - Down Dog with Bent Knees and Heels on Mat  - 1 x daily - 7 x weekly - 2-3  reps - 15-30 seconds hold - Bird Dog  - 1 x daily - 7 x weekly - 3 sets - 10 reps - Lateral Shuffles  - 1 x daily - 7 x weekly - 3 sets - 10 reps - Braided Sidestepping  - 1 x daily - 7 x weekly - 3 sets - 10 reps - Standing Balance Activity: Plyometric Modified Lower Extremity Jumping Jack  - 1 x daily - 7 x weekly - 3 sets - 10 reps - Standing Forward Step Taps with Counter Support  - 1 x daily - 7 x weekly - 3 sets - 10 reps (at bottom of steps with no UE support, red TB around ankles) - Ankle Inversion with Resistance  - 1 x daily - 7 x weekly - 3 sets - 10 reps - Modified Thomas Stretch  - 1 x daily - 7 x weekly - 2-3 sets - 1-2 minutes  hold - Supine Straight Leg Lumbar Rotation Stretch  - 1 x daily - 7 x weekly - 3 sets - 10 reps - Psoas Mobilization with Small Ball  - 1 x daily - 7 x weekly - 3 sets - 2-3 minute hold - Pigeon Pose  - 1 x daily - 7 x weekly - 1-2 sets - 1-2 minutes hold  From CIR Therapist: Also instructed pt in 2 additional exercises but no printed versions available: - tall kneeling to half kneeling - sit to half kneeling and then return to sit. Perform to each side.   Verbally added 02/21/24: -squats at countertop with chair behind you  Verbally added 03/13/24: - eccentric heel taps  - jumping over line fwd/retro/laterally   Verbally added 03/27/24  - Pallof press rotations w/red theraband   GOALS: Goals reviewed with patient? Yes  SHORT TERM GOALS: Target date: 02/28/2024  Pt will improve FGA to 28/30 for decreased fall risk  Baseline: 21/30, 25/30 (5/13) Goal status: IN PROGRESS  2.  HiMat to be assessed at STG check and LTG updated  Baseline: 11/54 (5/13) Goal status: MET  3.  Pt will improve gait velocity to at least 3.4 ft/s for improved gait efficiency and return to PLOF Baseline: 3.0 ft/sec (eval), 3.31 ft/sec (5/13) Goal status: IN PROGRESS  4.  ABC scale to be assessed and LTG updated  Baseline: 70.6% (5/13) Goal status: MET  5.  Pt will  demonstrate proper lifting technique of 25# weight from floor for proper body mechanics and  safety at work Baseline:  Goal status: MET  LONG TERM GOALS: Target date: 03/13/2024  Pt will improve gait velocity to at least 3.5 ft/s for improved gait efficiency and independence Baseline: 3.0 ft/sec (eval), 3.31 ft/sec (5/13), 3.57 ft/sec (5/29) Goal status: MET  2.  Pt will improve her score on the HiMat to >/= 16/54 to demonstrate improved balance and function Baseline: 11/54 (5/13), 21/54 (5/29) Goal status: MET  3.  Pt will improve her ABC scale score to >/= 80% for improved confidence in her balance. Baseline: 70.6% (5/13), 90% (5/29) Goal status: MET  4.  Pt will be able to carry 10# object in BUEs up/down 4 steps for improved coordination and safety in work environment   Baseline: can only carry w/single UE, can carry with BUE mod I (5/29) Goal status: MET  5.  Pt will perform [place] get up w/5# weight for improved shoulder stability OH, core stability and high level balance  Baseline: CGA to min A to perform (5/29) Goal status: MET  NEW SHORT TERM GOALS:   Target date: 04/05/2024  Pt will improve her score on the HiMat to >/= 26/54 to demonstrate improved balance and function Baseline: 11/54 (5/13), 21/54 (5/29), 13/54 (6/23) Goal status: NOT MET  2.  Pt will be able to perform 5 deep squats and return to standing to demonstrate improved function Baseline: 2 squats before unable to stand up without physical assist, 5 squats mod I (6/23) Goal status: MET  3.  Manual TUG to be assessed and LTG set Baseline: to be assessed Goal status: MET  4.  Cognitive TUG to be assessed and LTG set Baseline: to be assessed Goal status: MET    NEW LONG TERM GOALS:  Target date: 04/26/2024  Pt will improve gait velocity to at least 3.75 ft/s for improved gait efficiency and independence Baseline: 3.0 ft/sec (eval), 3.31 ft/sec (5/13), 3.57 ft/sec (5/29) Goal status: INITIAL  2.  Pt  will improve her score on the HiMat to >/= 25/54 to demonstrate improved balance and function Baseline: 11/54 (5/13), 21/54 (5/29), 13/54 (6/23) Goal status: REVISED  3.  Pt will be able to perform 10 deep squats and return to standing to demonstrate improved function Baseline: 2 squats before unable to stand up without physical assist Goal status: INITIAL  4.  Pt will improve manual TUG to less than or equal to 12 seconds for improved functional mobility and decreased fall risk.  Baseline: 17.59s Goal status: INITIAL  5.  Pt will improve cognitive TUG to less than or equal to 10 seconds for improved functional mobility and decreased fall risk.  Baseline: 12.35s counting backwards by 8 Goal status: INITIAL      ASSESSMENT:  CLINICAL IMPRESSION: Emphasis of skilled PT session on  proximal stability, hip flexor strength and spasticity management. Pt reports she has a TEE scheduled for this week due to confirmation of PFO. Pt still plans to return to work next month but continues to be limited by impaired coordination and significant tone in LLE. Pt has improved her walking endurance but demonstrates L knee extensor thrust w/fatigue. However, pt able to reduce this by placing weight on her L midfoot rather than heel w/IC.  Pt's L hip flexor very weak but when challenged, extensor tone restricts her ability to flex her knee, so focusing on shortened hip flexor position w/knees extended for now. Continue POC.   OBJECTIVE IMPAIRMENTS: Abnormal gait, decreased activity tolerance, decreased balance, decreased coordination, decreased endurance, decreased mobility,  decreased strength, increased muscle spasms, impaired UE functional use, improper body mechanics, and pain  ACTIVITY LIMITATIONS: carrying, lifting, squatting, stairs, reach over head, hygiene/grooming, and locomotion level  PARTICIPATION LIMITATIONS: meal prep, cleaning, laundry, interpersonal relationship, driving, shopping,  community activity, occupation, and yard work  PERSONAL FACTORS: 1 comorbidity: R internal capsule infarct/R basal ganglia stroke are also affecting patient's functional outcome.   REHAB POTENTIAL: Excellent  CLINICAL DECISION MAKING: Evolving/moderate complexity  EVALUATION COMPLEXITY: Moderate  PLAN:  PT FREQUENCY: 2-3x/week (starting w/2x following eval)  PT DURATION: 6 weeks  PLANNED INTERVENTIONS: 02835- PT Re-evaluation, 97750- Physical Performance Testing, 97110-Therapeutic exercises, 97530- Therapeutic activity, [id number]- Neuromuscular re-education, 97535- Self Care, [id number]- Manual therapy, [phone]- Gait training, [phone]- Canalith repositioning, [phone]- Aquatic Therapy, Patient/Family education, Balance training, Stair training, Taping, Dry Needling, Joint mobilization, Spinal mobilization, Vestibular training, and DME instructions  PLAN FOR NEXT SESSION:  High level agility, elliptical, L quad eccentric strength, OH tasks. Practice w/plastic needles Marvetta has these) in oranges and pin the needle to targets? Cognitive dual tasking, needs to work on deep squats and return to standing from squats, agility drills, treadmill training with focus on increasing speed and potentially running? Half kneel, ladder drills, carrying weight with both hands (on rockerboard, stairs), agility ladder. Jumping. Wobble bar, standing hip half circles on airex, hip flexor strength w/knees extended   Pt goals: squats (for patient care), time based tasks, not lose my balance (would lose balance with perturbations/reactive balance), multitasking/cog dual tasking (talking while doing tasks). Work simulation tasks (bed mobility, transfers, holding items)   Camyah Pultz E Darice Vicario, PT, DPT 04/16/2024, 1:59 PM

## 2024-04-16 NOTE — Therapy (Signed)
 OUTPATIENT OCCUPATIONAL THERAPY NEURO TREATMENT  Patient Name: Faith Jordan MRN: [id number] DOB:1996-08-20, 28 y.o., female Today's Date: 04/16/2024  PCP: Gretta Comer POUR, NP  REFERRING PROVIDER: Carilyn Prentice BRAVO, MD   END OF SESSION:  OT End of Session - 04/16/24 1233     Visit Number 16    Number of Visits 25   including eval   Date for OT Re-Evaluation 05/25/24    Authorization Type [employer] [facility]) 2025    OT Start Time 1230    OT Stop Time 1315    OT Time Calculation (min) 45 min    Activity Tolerance Patient tolerated treatment well    Behavior During Therapy [facility] for tasks assessed/performed          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   Acute flank pain 06/02/2022   Angio-edema 04/27/2016   Angioedema    Aquagenic angio-edema-urticaria    Asthma    no problems /not used recently   Dysautonomia (HCC)    Dysrhythmia    sinus tachycardia   Fibroid    right breast, adenoma   Headache(784.0)    History of COVID-19 11/21/2020   Hives 12/22/2015   Pneumonia    hx  6th grade   Sensation of fullness in both ears 02/02/2023   Snoring 02/11/2022   Urinary frequency 02/12/2019   Vaginal yeast infection 06/02/2022   Vasculitis (HCC)    Past Surgical History:  Procedure Laterality Date   ADENOIDECTOMY     BREAST LUMPECTOMY Right    BREAST LUMPECTOMY Right    MASS EXCISION Right 10/07/2014   Procedure: EXCISION OF RIGHT BREAST MASS;  Surgeon: Krystal Russell, MD;  Location: [facility] OR;  Service: General;  Laterality: Right;   TONSILLECTOMY AND ADENOIDECTOMY     TOOTH EXTRACTION     TRANSESOPHAGEAL ECHOCARDIOGRAM (CATH LAB) N/A 01/10/2024   Procedure: TRANSESOPHAGEAL ECHOCARDIOGRAM;  Surgeon: Jeffrie Oneil BROCKS, MD;  Location: [facility] INVASIVE CV LAB;  Service: Cardiovascular;  Laterality: N/A;   Patient Active Problem List   Diagnosis Date Noted   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) 04/09/2024   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 (CVA) 04/05/2024   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) 02/10/2024   Coping style affecting medical condition 01/19/2024   History of CVA (cerebrovascular accident) 01/12/2024   Stroke of right basal ganglia (HCC) 01/07/2024   Non-restorative sleep 05/26/2022   Sleep paralysis, 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   Vivid dream 05/26/2022   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   Excessive daytime sleepiness 05/26/2022   Sleep disturbance 03/17/2022   Panic attacks 02/11/2022   Vitamin B12 deficiency 07/30/2021   Vitamin D  deficiency 07/30/2021   Family history of hypothyroidism 07/30/2021   GAD (generalized anxiety disorder) 03/18/2020   Fatigue 11/06/2019   Chronic back pain 07/06/2019   Preventative health care 04/27/2018   Migraines 04/25/2018   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   Anemia 01/04/2018   Headache disorder 12/29/2015   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   Sinus tachycardia 12/22/2015   Dysautonomia (HCC) 12/22/2015   Dysautonomia, familial (HCC) 04/22/2015   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   Neurocardiogenic syncope 04/06/2013   Intermittent palpitations 04/06/2013    ONSET DATE: 01/26/2024 (referral date)  REFERRING DIAG: I63.81 (ICD-10-CM) - Other cerebral infarction due to occlusion or stenosis of small artery   THERAPY DIAG:  Muscle weakness (  generalized)  Unsteadiness on feet  Other symptoms and signs involving the nervous system  Other symptoms and signs involving the musculoskeletal system  Other lack of coordination  Hemiplegia and hemiparesis following cerebral infarction affecting left non-dominant side (HCC)  Rationale for Evaluation and Treatment: Rehabilitation  SUBJECTIVE:   SUBJECTIVE STATEMENT: I still have some balance deficits, strength and fine motor deficits on Lt  side. No pain and no recent falls.   Pronunciation: Faith Jordan  She finds that the apps she has for cognition are not challenging enough for her.   Pt accompanied by: self   PERTINENT HISTORY: familiar hemiplegic migraine, angioedema 2017,dysautonomia; infarct right internal capsule/ R basal ganglia stroke   MRI of brain on 01/07/24   IMPRESSION: 1. 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 2. Susceptibility related calcifications in the globus pallidus bilaterally. This likely reflects the sequela of chronic microvascular ischemia.  PRECAUTIONS: Fall, Per 02/10/24 MD Office Visit: Pt may gradually begin to return to driving.   WEIGHT BEARING RESTRICTIONS: No  PAIN:  Are you having pain? No  FALLS: Has patient fallen in last 6 months? Yes when CVA event occurred.  03/05/24 - Pt reported recent fall after trying to get clothes out of dryer. Pt denied injuries and able to ind get up after fall.   LIVING ENVIRONMENT: Lives with: lives with their family Lives in: House/apartment Stairs: Yes: External: 4 steps; bilateral but cannot reach both Has following equipment at home: shower chair  PLOF: Independent, typically living ind, worked as Engineer, civil (consulting)  PATIENT GOALS: I want to get my hand and arm back and be able to go to work.  OBJECTIVE:  Note: Objective measures were completed at Evaluation unless otherwise noted.  HAND DOMINANCE: Right  ADLs: Overall ADLs:  Transfers/ambulation related to ADLs: Eating: ind Grooming: difficulty with completing hair styling e.g. putting hair up in ponytail, difficulty with curling hair. Uses RUE to complete toothbrushing and brushing hair.  Difficulty with clipping fingernails. UB Dressing: ind, extra time. Buttons and zippers difficult.  LB Dressing: Ind, difficulty with pulling up pants and buttoning buttons on jeans, extra time for tying shoes Toileting: ind  Bathing: difficulty washing  hair d/t unable to apply pressure while moving LUE, typically uses RUE to ensure thoroughness, shower bench is available PRN Tub Shower transfers: walk-in shower, pt reported SOB after showering or when walking a lot, sometimes requires rest breaks Equipment: Shower seat with back and Walk in shower  IADLs: Shopping: assistance from parents but pt participates, such as trying to reach for items on shelf and pushing cart Light housekeeping: pt completes dishwashing, has not attempted laundry Meal Prep: pt reported cooking but task is tiring Community mobility: has attempted to drive 1x with parent  Handwriting: No change per pt.   Work activities: Pt expressed concerns with FM coordination and concerns about return to work considerations, such as coordination required for drawing up syringes from a vial for medication administration, completing IVs, typing, navigating phone.   MOBILITY STATUS: Independent  POSTURE COMMENTS:  Ind sitting balance, sometimes forward shoulder posture  ACTIVITY TOLERANCE: Activity tolerance: Pt reported I'm doing more than I was doing in the hospital, but I definitely get tired easier and take more breaks.  FUNCTIONAL OUTCOME MEASURES: PSF: 3.0  03/15/24 - PSFS: 7.0    UPPER EXTREMITY ROM:    Active ROM Right eval Left eval  Shoulder flexion [facility]  Shoulder abduction  WFL  Shoulder adduction  Shoulder extension    Shoulder internal rotation    Shoulder external rotation    Elbow flexion  WFL - slightly delayed movements  Elbow extension    Wrist flexion    Wrist extension    Wrist ulnar deviation    Wrist radial deviation    Wrist pronation    Wrist supination    (Blank rows = not tested)  OT palpated pt's L shoulder and noted tightness of muscles at L shoulder.   UPPER EXTREMITY MMT:     MMT Right eval Left eval  Shoulder flexion 5 3+   HAND FUNCTION: Grip strength: Right: 48.9, 53.5, 53.3 (51.9 lbs average) lbs; Left:  24.2, 22.2, 22.7 (23.1 lbs average) lbs  COORDINATION: 9 Hole Peg test: Right: 20 sec; Left: 40 sec  Pt reported practicing picking up pills, sorting change, and playing connect-4 at home.  SENSATION: Pt reported tingling in L thumb which has since resolves. Pt questioned if symptoms might have been from resting hand splint at night. Pt reported no other symptoms of numbness/tingling.  EDEMA: None noted  MUSCLE TONE: RUE: Within functional limits and LUE: slight ataxia  COGNITION: Overall cognitive status: Within functional limits for tasks assessed  Pt reported some difficulty with word-finding.  Pt reported sometimes asking same question again a few hours apart. Pt's mother reported not noticing this recently.  VISION: Subjective report: Pt reported initial changes to vision (unable to read, blurry vision close-up and far away) though okay now.  Baseline vision: No visual deficits Visual history: none noted  VISION ASSESSMENT: Pt accurately clock on wall and reported reading books on Kindle. Pt reported still using large print on phone since hospital.   Pt tracked easily to all 4 quadrants. Peripheral vision - WFL B sides, ?slightly decreased on L side though functional regardless  Patient has difficulty with following activities due to following visual impairments: None noted  PERCEPTION: Not tested  PRAXIS: Not tested  OBSERVATIONS: Pt ambulated ind. Pt soft-spoken and appeared tired during session. Pt was pleasant and appeared motivated to complete LUE functional tasks at home based on pt report.                                                                                                                             TODAY'S TREATMENT:    Pt issued updated coordination HEP - see pt instructions for details. Pt return demo of each   Simulated transferring pt from chair to bed and back to chair, and from supine to sitting w/ cues for proper body mechanics.   Pt  ambulating while tossing 2 balls (alternating one hand, then the other) and performing cognitive task (category generation naming foods then animals with each letter of the alphabet)  with min difficulty  Quadraped - alternating UE/LE (bird dog) position with difficulty in timing and coordination/smoothness of movement.  Tall to short kneeling exercises with cues to go slower - pt with more difficulty  going slower and using eccentric control.  Modified plank exercises on knees to engage core - pt with max difficulty doing regular plank on elbows  PATIENT EDUCATION: Education details: functional activities; endurance; cognition - deductive reasoning  Person educated: Patient Education method: Explanation, Demonstration, and Verbal cues Education comprehension: verbalized understanding, returned demonstration, verbal cues required, and needs further education  HOME EXERCISE PROGRAM: 02/09/24 - Affected UE ROM and Yellow Theraputty. Access Code: [email or web address]. 02/15/2024: L handed typing words 02/21/24 - Digit ext with theraputty or rubber band (access code: [email or web address]). Functional reach: Handwritten/Verbal instructions: 1 set in front of mirror, 1 set without mirror: Reaching overhead to touch top of head with affected hand, reaching overhead to touch back of head with affected hand, reaching arm forward and touching nose with affected UE - 10 reps, 2 sets, 2x per day. 03/07/2024: Theraband; dowel 03/13/24 - graded up to pink theraputty. Reps/sets adjusted: Access Code: [email or web address]. 03/15/24 - Verbal instructions: Daily typing practice. LUE overhead reaching - Access Code: [email or web address]  GOALS: Goals reviewed with patient? Yes  SHORT TERM GOALS: Target date: 03/09/24  Pt will ind demo understanding of HEP using visual handouts. Baseline: new to outpt OT Goal status: MET  2.  Pt will ind recall at least 3 energy conservation strategies. Baseline: Activity tolerance: Pt reported I'm doing more than I was doing  in the hospital, but I definitely get tired easier and take more breaks.  03/13/24 - Pt ind recalled breathing, spacing out activities throughout the day, naps, taking breaks PRN.  Goal status: MET  3.  Pt and/or family will return demo of personal e-stim unit use and electrode placement for affected UE.  Baseline: pt reported she has ordered a personal e-stim unit.  02/09/24 - Education and handout provided. Pt returned demo of using personal e-stim unit for wrist flex/ext of affected UE. Goal status: MET  4.  Pt will report improved ability to use LUE during overhead reaching tasks with minimal use of RUE to compensate, such as hair care tasks and washing hair. Baseline: pt reported difficulty with hair styling (e.g. putting hair in ponytail, curling hair) 03/13/24 - Pt reported continued difficulty with putting hair in a bun and washing hair. Goal status: in progress  LONG TERM GOALS: Target date: 05/25/2024   Patient will report at least two-point increase in average PSFS score or at least three-point increase in a single activity score indicating functionally significant improvement given minimum detectable change using adaptive strategies and A/E PRN. Baseline: PSF: 3.0 total score (See above for individual activity scores)  03/15/24 - PSFS: 7.0 Goal status: MET and ongoing  2.  Patient will demonstrate at least  37 lbs LUE grip strength as needed to open jars and other containers.  Baseline: Grip strength: Right: 48.9, 53.5, 53.3 (51.9 lbs average) lbs; Left: 24.2, 22.2, 22.7 (23.1 lbs average) lbs 03/07/2024: LUE - 33.1 lbs Goal status: MET on 03/07/24 and revised on 03/13/24  3.  Patient will demo improved FM coordination as evidenced by completing nine-hole peg with use of LUE in 30 seconds or less.  Baseline: 9 Hole Peg test: Right: 20 sec; Left: 40 sec 03/07/2024: 34 sec Goal status: in progress  4.  Pt will verbalize understanding of A/E, adaptive strategies for functional tasks as  needed for return to work considerations. Baseline: Work activities: Pt expressed concerns with FM coordination and concerns about return to work considerations, such as coordination required for drawing up syringes from a vial for medication administration,  completing IVs, typing, navigating phone. Goal status: in progress  ASSESSMENT:  CLINICAL IMPRESSION: Pt demonstrating mild difficulty with dual tasking and gross motor coordination tasks that engage core. She would benefit from focus on cognitive challenges, core control, as well as LUE gross motor coordination and overall endurance.   PERFORMANCE DEFICITS: in functional skills including ADLs, IADLs, coordination, dexterity, proprioception, sensation, tone, ROM, strength, pain, muscle spasms, flexibility, Fine motor control, Gross motor control, mobility, balance, endurance, and UE functional use, cognitive skills including attention and energy/drive, and psychosocial skills including environmental adaptation.   IMPAIRMENTS: are limiting patient from ADLs, IADLs, rest and sleep, work, leisure, and social participation.   CO-MORBIDITIES: may have co-morbidities  that affects occupational performance. Patient will benefit from skilled OT to address above impairments and improve overall function.  REHAB POTENTIAL: Good  PLAN:  OT FREQUENCY: Additional 1-2x/week  OT DURATION: Additional  6 weeks   PLANNED INTERVENTIONS: 97168 OT Re-evaluation, 97535 self care/ADL training, 02889 therapeutic exercise, 97530 therapeutic activity, 97112 neuromuscular re-education, 97140 manual therapy, 97035 ultrasound, 97018 paraffin, 02960 fluidotherapy, 97010 moist heat, 97010 cryotherapy, 97760 Orthotic Initial, 97761 Prosthetic Initial, 97763 Orthotic/Prosthetic subsequent, passive range of motion, functional mobility training, visual/perceptual remediation/compensation, energy conservation, patient/family education, and DME and/or AE  instructions  RECOMMENDED OTHER SERVICES: N/A for this visit  CONSULTED AND AGREED WITH PLAN OF CARE: Patient   PLAN FOR NEXT SESSION: work simulation (nurse in ED)  LUE strength and gross motor coordination; ? Try blaze pods while standing on compliant surface and in quadraped   [place], OT 04/16/2024, 12:34 PM

## 2024-04-16 NOTE — Patient Instructions (Signed)
  Coordination Activities  Perform the following activities for 10 minutes 2 times per day with left hand(s).  Rotate ball in fingertips (clockwise and counter-clockwise). Toss ball in air and catch with the same hand. Deal cards with your thumb (Hold deck in hand and push card off top with thumb). Rotate one card in hand (clockwise and counter-clockwise). Pick up coins one at a time until you get 5 in your hand, then move coins from palm to fingertips to stack one at a time. Practice typing. Flip pen over and then scoot down to writing end, then flip over and scoot down to eraser end

## 2024-04-16 NOTE — Telephone Encounter (Signed)
 Xeomin 300 units sent to [facility] as requested

## 2024-04-16 NOTE — Addendum Note (Signed)
 Addended by: ONEITA NEVELYN BRAVO on: 04/16/2024 07:28 AM   Modules accepted: Orders

## 2024-04-16 NOTE — Telephone Encounter (Signed)
 Received approval, please send Xeomin 300u rx to [facility]  Auth#: [id number] (04/12/24-04/11/25)

## 2024-04-18 ENCOUNTER — Ambulatory Visit: Admitting: Physical Therapy

## 2024-04-18 ENCOUNTER — Encounter: Admitting: Occupational Therapy

## 2024-04-18 DIAGNOSIS — Q2112 Patent foramen ovale: Secondary | ICD-10-CM | POA: Diagnosis not present

## 2024-04-18 DIAGNOSIS — I6930 Unspecified sequelae of cerebral infarction: Secondary | ICD-10-CM | POA: Diagnosis not present

## 2024-04-19 ENCOUNTER — Encounter: Attending: Physical Medicine & Rehabilitation | Admitting: Physical Medicine & Rehabilitation

## 2024-04-19 ENCOUNTER — Telehealth: Payer: Self-pay | Admitting: Physical Medicine & Rehabilitation

## 2024-04-19 ENCOUNTER — Encounter: Payer: Self-pay | Admitting: Physical Medicine & Rehabilitation

## 2024-04-19 VITALS — BP 111/75 | HR 76 | Ht 67.0 in | Wt 155.0 lb

## 2024-04-19 DIAGNOSIS — I639 Cerebral infarction, unspecified: Secondary | ICD-10-CM | POA: Insufficient documentation

## 2024-04-19 DIAGNOSIS — G8114 Spastic hemiplegia affecting left nondominant side: Secondary | ICD-10-CM | POA: Diagnosis not present

## 2024-04-19 NOTE — Progress Notes (Signed)
 Subjective:    Patient ID: Faith Jordan, female    DOB: 02/02/1996, 28 y.o.   MRN: [id number]  HPI 28 year old female with history of migraines who suffered a right internal capsule infarct causing left hemiparesis on 01/07/2024.  She has completed inpatient rehabilitation and is now attending outpatient PT and OT.  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  She is continue to work with PT and OT and doing some work Counselling psychologist.  In the interval time she has also been evaluated at [facility] Department of neurology, Dr. Lanny who adjusted baclofen  dose to 5 mg twice daily and 10 mg at night. The patient is also followed up with Dr. Darleen.  The patient was also referred to Dr. Onita for Botox  injections left lower extremity. The patient gets thigh extensor spasms with prolonged activity such as walking 1 mile.  She also gets left ankle clonus but this is intermittent and usually does not occur when she is walking.  Overall her spasms have improved with the adjustment of baclofen  dosage. She is here with her mother today.  She has hopes of returning to work at least in some limited fashion.  She worked as an Manufacturing systems engineer  The patient continues to have some fatigue in the afternoon but no longer requiring a nap in the afternoon.  The patient feels this may be related to reducing daytime baclofen  dosing Pain Inventory Average Pain 0 Pain Right Now 0 My pain is .  In the last 24 hours, has pain interfered with the following? General activity . Relation with others . Enjoyment of life . What TIME of day is your pain at its worst? . Sleep (in general) Good  Pain is worse with: . Pain improves with: . Relief from Meds: .  Family History  Problem Relation Age of Onset   Depression Mother    Migraines Mother    Narcolepsy Mother    Hypothyroidism Mother    Breast cancer Maternal Grandmother 81    Myasthenia gravis Maternal Grandfather    Asthma Other    Cancer Other    Epilepsy Other    Allergic rhinitis Neg Hx    Angioedema Neg Hx    Atopy Neg Hx    Eczema Neg Hx    Immunodeficiency Neg Hx    Urticaria Neg Hx    Stroke Neg Hx    Social History   Socioeconomic History   Marital status: Significant Other    Spouse name: Not on file   Number of children: Not on file   Years of education: Not on file   Highest education level: Bachelor's degree (e.g., BA, AB, BS)  Occupational History   Not on file  Tobacco Use   Smoking status: Never   Smokeless tobacco: Never  Vaping Use   Vaping status: Never Used  Substance and Sexual Activity   Alcohol use: No   Drug use: No   Sexual activity: Yes  Other Topics Concern   Not on file  Social History Narrative   Lives with fiance   R handed   Caffeine : 1 drink a day   Social Drivers of Corporate investment banker Strain: Not on file  Food Insecurity: No Food Insecurity (01/09/2024)   Hunger Vital Sign    Worried About Running Out of Food in the Last Year: Never true    Ran Out of  Food in the Last Year: Never true  Transportation Needs: No Transportation Needs (01/09/2024)   PRAPARE - Administrator, Civil Service (Medical): No    Lack of Transportation (Non-Medical): No  Physical Activity: Not on file  Stress: Not on file  Social Connections: Not on file   Past Surgical History:  Procedure Laterality Date   ADENOIDECTOMY     BREAST LUMPECTOMY Right    BREAST LUMPECTOMY Right    MASS EXCISION Right 10/07/2014   Procedure: EXCISION OF RIGHT BREAST MASS;  Surgeon: Krystal Russell, MD;  Location: [facility] OR;  Service: General;  Laterality: Right;   TONSILLECTOMY AND ADENOIDECTOMY     TOOTH EXTRACTION     TRANSESOPHAGEAL ECHOCARDIOGRAM (CATH LAB) N/A 01/10/2024   Procedure: TRANSESOPHAGEAL ECHOCARDIOGRAM;  Surgeon: Jeffrie Oneil BROCKS, MD;  Location: [facility] INVASIVE CV LAB;  Service: Cardiovascular;  Laterality: N/A;    Past Surgical History:  Procedure Laterality Date   ADENOIDECTOMY     BREAST LUMPECTOMY Right    BREAST LUMPECTOMY Right    MASS EXCISION Right 10/07/2014   Procedure: EXCISION OF RIGHT BREAST MASS;  Surgeon: Krystal Russell, MD;  Location: [facility] OR;  Service: General;  Laterality: Right;   TONSILLECTOMY AND ADENOIDECTOMY     TOOTH EXTRACTION     TRANSESOPHAGEAL ECHOCARDIOGRAM (CATH LAB) N/A 01/10/2024   Procedure: TRANSESOPHAGEAL ECHOCARDIOGRAM;  Surgeon: Jeffrie Oneil BROCKS, MD;  Location: [facility] INVASIVE CV LAB;  Service: Cardiovascular;  Laterality: N/A;   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   Acute flank pain 06/02/2022   Angio-edema 04/27/2016   Angioedema    Aquagenic angio-edema-urticaria    Asthma    no problems /not used recently   Dysautonomia [facility])    Dysrhythmia    sinus tachycardia   Fibroid    right breast, adenoma   Headache(784.0)    History of COVID-19 11/21/2020   Hives 12/22/2015   Pneumonia    hx  6th grade   Sensation of fullness in both ears 02/02/2023   Snoring 02/11/2022   Urinary frequency 02/12/2019   Vaginal yeast infection 06/02/2022   Vasculitis (HCC)    BP 111/75 (BP Location: Right Arm, Cuff Size: Normal)   Pulse 76   Ht 5' 7 (1.702 m)   Wt 155 lb (70.3 kg)   SpO2 98%   BMI 24.28 kg/m   Opioid Risk Score:   Fall Risk Score:  `1  Depression screen [facility] 2/9     02/10/2024    1:44 PM 02/08/2024    8:36 AM 11/29/2023    8:08 AM 06/10/2022    2:28 PM 06/02/2022    8:27 AM 07/30/2021   10:19 AM 02/03/2021    9:41 AM  Depression screen PHQ 2/9  Decreased Interest 2 1 0  0 0 0  Down, Depressed, Hopeless 2 2 0  0 0 0  PHQ - 2 Score 4 3 0  0 0 0  Altered sleeping 2 0 0  0 0 0  Tired, decreased energy 2 2 0  3 0 1  Change in appetite 2 0 0  0 0 0  Feeling bad or failure about yourself  2 0 0  0 0 0  Trouble concentrating 0 0 0  0 0 0  Moving slowly or fidgety/restless 0 2 0  0 0 0  Suicidal  thoughts 0 0 0  0 0 0  PHQ-9 Score 12 7 0  3  0 1  Difficult doing work/chores Very difficult Not difficult at all Not difficult at all  Not difficult at all Not difficult at all Somewhat difficult     Information is confidential and restricted. Go to Review Flowsheets to unlock data.     Review of Systems     Objective:   Physical Exam General No acute distress Mood and affect appropriate Tone no evidence of spasticity in the left upper extremity or left lower extremity she does have 3+ knee extensor reflexes Sustained clonus at the left ankle. Sensation intact to light touch in the upper and lower limbs Motor strength is 5/5 in the right deltoid bicep tricep grip hip flexor knee extensor ankle dorsiflexor 4+ in the left deltoid bicep tricep grip hip flexor knee extensor ankle dorsiflexor Ambulates without assistive device no evidence of toe drag or knee instability.       Assessment & Plan:   1.  Left spastic hemiparesis overall making a good recovery.  She is doing some work simulation with PT and OT will continue this.  We discussed that she may be able to return to work in some limited fashion in the future.  Would continue work simulation with PT OT and I would like to get their input on her abilities as well. I will see her back in 4 to 6 weeks Discussed her various consultations as well as spasticity management options with the patient.  Greater than 30 minutes spent answering questions for patient and mother

## 2024-04-19 NOTE — Telephone Encounter (Signed)
 Patient called in requesting a note for work to return on 7/14

## 2024-04-23 ENCOUNTER — Other Ambulatory Visit

## 2024-04-23 ENCOUNTER — Ambulatory Visit: Attending: Physical Medicine & Rehabilitation | Admitting: Occupational Therapy

## 2024-04-23 ENCOUNTER — Encounter: Payer: Self-pay | Admitting: Physical Medicine & Rehabilitation

## 2024-04-23 ENCOUNTER — Telehealth: Payer: Self-pay | Admitting: Neurology

## 2024-04-23 ENCOUNTER — Ambulatory Visit: Admitting: Physical Therapy

## 2024-04-23 DIAGNOSIS — R278 Other lack of coordination: Secondary | ICD-10-CM | POA: Insufficient documentation

## 2024-04-23 DIAGNOSIS — R2681 Unsteadiness on feet: Secondary | ICD-10-CM

## 2024-04-23 DIAGNOSIS — R29818 Other symptoms and signs involving the nervous system: Secondary | ICD-10-CM | POA: Diagnosis not present

## 2024-04-23 DIAGNOSIS — R2689 Other abnormalities of gait and mobility: Secondary | ICD-10-CM | POA: Insufficient documentation

## 2024-04-23 DIAGNOSIS — M6281 Muscle weakness (generalized): Secondary | ICD-10-CM | POA: Diagnosis not present

## 2024-04-23 DIAGNOSIS — R29898 Other symptoms and signs involving the musculoskeletal system: Secondary | ICD-10-CM | POA: Insufficient documentation

## 2024-04-23 DIAGNOSIS — I69354 Hemiplegia and hemiparesis following cerebral infarction affecting left non-dominant side: Secondary | ICD-10-CM | POA: Diagnosis not present

## 2024-04-23 DIAGNOSIS — I6381 Other cerebral infarction due to occlusion or stenosis of small artery: Secondary | ICD-10-CM | POA: Insufficient documentation

## 2024-04-23 NOTE — Therapy (Signed)
 OUTPATIENT OCCUPATIONAL THERAPY NEURO TREATMENT & DISCHARGE  Patient Name: Faith Jordan MRN: [id number] DOB:1996/09/22, 28 y.o., female Today's Date: 04/23/2024  OCCUPATIONAL THERAPY DISCHARGE SUMMARY  Visits from Start of Care: 17  Current functional level related to goals / functional outcomes: Patient has met all initial short and long-term goals to date.   Remaining deficits: Slight limitations with L grip strength though [facility]   Education / Equipment: Continue with LUE HEP as needed for further remediation efforts.   Patient agrees to discharge. Patient goals were met. Patient is being discharged due to meeting the stated rehab goals.SABRA    PCP: Gretta Comer POUR, NP  REFERRING PROVIDER: Carilyn Prentice BRAVO, MD   END OF SESSION:  OT End of Session - 04/23/24 1150     Visit Number 17    Number of Visits 25   including eval   Date for OT Re-Evaluation 05/25/24    Authorization Type [employer] [facility]) 2025    OT Start Time 1150    OT Stop Time 1229    OT Time Calculation (min) 39 min    Activity Tolerance Patient tolerated treatment well    Behavior During Therapy [facility] for tasks assessed/performed          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   Acute flank pain 06/02/2022   Angio-edema 04/27/2016   Angioedema    Aquagenic angio-edema-urticaria    Asthma    no problems /not used recently   Dysautonomia (HCC)    Dysrhythmia    sinus tachycardia   Fibroid    right breast, adenoma   Headache(784.0)    History of COVID-19 11/21/2020   Hives 12/22/2015   Pneumonia    hx  6th grade   Sensation of fullness in both ears 02/02/2023   Snoring 02/11/2022   Urinary frequency 02/12/2019   Vaginal yeast infection 06/02/2022   Vasculitis (HCC)    Past Surgical History:  Procedure Laterality Date   ADENOIDECTOMY     BREAST LUMPECTOMY Right    BREAST LUMPECTOMY Right    MASS EXCISION Right 10/07/2014    Procedure: EXCISION OF RIGHT BREAST MASS;  Surgeon: Krystal Russell, MD;  Location: [facility] OR;  Service: General;  Laterality: Right;   TONSILLECTOMY AND ADENOIDECTOMY     TOOTH EXTRACTION     TRANSESOPHAGEAL ECHOCARDIOGRAM (CATH LAB) N/A 01/10/2024   Procedure: TRANSESOPHAGEAL ECHOCARDIOGRAM;  Surgeon: Jeffrie Oneil BROCKS, MD;  Location: [facility] INVASIVE CV LAB;  Service: Cardiovascular;  Laterality: N/A;   Patient Active Problem List   Diagnosis Date Noted   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) 04/09/2024   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 (CVA) 04/05/2024   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) 02/10/2024   Coping style affecting medical condition 01/19/2024   History of CVA (cerebrovascular accident) 01/12/2024   Stroke of right basal ganglia (HCC) 01/07/2024   Non-restorative sleep 05/26/2022   Sleep paralysis, 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   Vivid dream 05/26/2022   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   Excessive daytime sleepiness 05/26/2022   Sleep disturbance 03/17/2022   Panic attacks 02/11/2022   Vitamin B12 deficiency 07/30/2021   Vitamin D  deficiency 07/30/2021   Family history of hypothyroidism 07/30/2021   GAD (generalized anxiety disorder) 03/18/2020   Fatigue 11/06/2019   Chronic back pain 07/06/2019   Preventative health care 04/27/2018   Migraines 04/25/2018   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   Anemia 01/04/2018   Headache disorder 12/29/2015   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   Sinus tachycardia 12/22/2015   Dysautonomia (HCC) 12/22/2015   Dysautonomia, familial (HCC) 04/22/2015   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   Neurocardiogenic syncope 04/06/2013   Intermittent palpitations 04/06/2013    ONSET DATE: 01/26/2024 (referral date)  REFERRING DIAG: I63.81 (ICD-10-CM) - Other cerebral infarction due to occlusion or stenosis of  small artery   THERAPY DIAG:  Muscle weakness (generalized)  Other lack of coordination  Other symptoms and signs involving the nervous system  Other symptoms and signs involving the musculoskeletal system  Hemiplegia and hemiparesis following cerebral infarction affecting left non-dominant side (HCC)  Cerebrovascular accident (CVA) of right basal ganglia (HCC)  Rationale for Evaluation and Treatment: Rehabilitation  SUBJECTIVE:   SUBJECTIVE STATEMENT: She is doing really well. Agreeable to d/c and verbalizes understanding of HEP.   Pronunciation: Bree-Anne-ah  Pt accompanied by: self   PERTINENT HISTORY: familiar hemiplegic migraine, angioedema 2017,dysautonomia; infarct right internal capsule/ R basal ganglia stroke   MRI of brain on 01/07/24   IMPRESSION: 1. 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 2. Susceptibility related calcifications in the globus pallidus bilaterally. This likely reflects the sequela of chronic microvascular ischemia.  PRECAUTIONS: Fall, Per 02/10/24 MD Office Visit: Pt may gradually begin to return to driving.   WEIGHT BEARING RESTRICTIONS: No  PAIN:  Are you having pain? No  FALLS: Has patient fallen in last 6 months? Yes when CVA event occurred.  03/05/24 - Pt reported recent fall after trying to get clothes out of dryer. Pt denied injuries and able to ind get up after fall.   LIVING ENVIRONMENT: Lives with: lives with their family Lives in: House/apartment Stairs: Yes: External: 4 steps; bilateral but cannot reach both Has following equipment at home: shower chair  PLOF: Independent, typically living ind, worked as Engineer, civil (consulting)  PATIENT GOALS: I want to get my hand and arm back and be able to go to work.  OBJECTIVE:  Note: Objective measures were completed at Evaluation unless otherwise noted.  HAND DOMINANCE: Right  ADLs: Overall ADLs:  Transfers/ambulation related to  ADLs: Eating: ind Grooming: difficulty with completing hair styling e.g. putting hair up in ponytail, difficulty with curling hair. Uses RUE to complete toothbrushing and brushing hair.  Difficulty with clipping fingernails. UB Dressing: ind, extra time. Buttons and zippers difficult.  LB Dressing: Ind, difficulty with pulling up pants and buttoning buttons on jeans, extra time for tying shoes Toileting: ind  Bathing: difficulty washing hair d/t unable to apply pressure while moving LUE, typically uses RUE to ensure thoroughness, shower bench is available PRN Tub Shower transfers: walk-in shower, pt reported SOB after showering or when walking a lot, sometimes requires rest breaks Equipment: Shower seat with back and Walk in shower  IADLs: Shopping: assistance from parents but pt participates, such as trying to reach for items on shelf and pushing cart Light housekeeping: pt completes dishwashing, has not attempted laundry Meal Prep: pt reported cooking but task is tiring Community mobility: has attempted to drive 1x with parent  Handwriting: No change per pt.   Work activities: Pt expressed concerns with FM coordination and concerns about return to work considerations, such as coordination required for drawing up syringes from a vial for medication administration, completing IVs, typing, navigating phone.   MOBILITY STATUS: Independent  POSTURE COMMENTS:  Ind sitting balance, sometimes forward shoulder posture  ACTIVITY TOLERANCE: Activity tolerance: Pt reported I'm doing more than I was doing in the hospital, but I definitely get tired easier and take more breaks.  FUNCTIONAL OUTCOME MEASURES: PSF: 3.0  03/15/24 - PSFS: 7.0   04/23/2024 -  PSFS: 8.3 total score  UPPER EXTREMITY ROM:    Active ROM Right eval Left eval  Shoulder flexion [facility]  Shoulder abduction  [facility]  Shoulder adduction    Shoulder extension    Shoulder internal rotation    Shoulder external rotation     Elbow flexion  WFL - slightly delayed movements  Elbow extension    Wrist flexion    Wrist extension    Wrist ulnar deviation    Wrist radial deviation    Wrist pronation    Wrist supination    (Blank rows = not tested)  OT palpated pt's L shoulder and noted tightness of muscles at L shoulder.   UPPER EXTREMITY MMT:     MMT Right eval Left eval  Shoulder flexion 5 3+   HAND FUNCTION: Grip strength: Right: 48.9, 53.5, 53.3 (51.9 lbs average) lbs; Left: 24.2, 22.2, 22.7 (23.1 lbs average) lbs  COORDINATION: 9 Hole Peg test: Right: 20 sec; Left: 40 sec  Pt reported practicing picking up pills, sorting change, and playing connect-4 at home.  SENSATION: Pt reported tingling in L thumb which has since resolves. Pt questioned if symptoms might have been from resting hand splint at night. Pt reported no other symptoms of numbness/tingling.  EDEMA: None noted  MUSCLE TONE: RUE: Within functional limits and LUE: slight ataxia  COGNITION: Overall cognitive status: Within functional limits for tasks assessed  Pt reported some difficulty with word-finding.  Pt reported sometimes asking same question again a few hours apart. Pt's mother reported not noticing this recently.  VISION: Subjective report: Pt reported initial changes to vision (unable to read, blurry vision close-up and far away) though okay now.  Baseline vision: No visual deficits Visual history: none noted  VISION ASSESSMENT: Pt accurately clock on wall and reported reading books on Kindle. Pt reported still using large print on phone since hospital.   Pt tracked easily to all 4 quadrants. Peripheral vision - WFL B sides, ?slightly decreased on L side though functional regardless  Patient has difficulty with following activities due to following visual impairments: None noted  PERCEPTION: Not tested  PRAXIS: Not tested  OBSERVATIONS: Pt ambulated ind. Pt soft-spoken and appeared tired during session. Pt  was pleasant and appeared motivated to complete LUE functional tasks at home based on pt report.                                                                                                                             TODAY'S TREATMENT:   Putty progressed to green. OT educated pt on grip, 5 second hold, and turn between each rep. OT also had pt retrieve nut from putty without placing putty on table or using  RUE for assistance.   OT reviewed coordination exercise and graded up for improved in hand manipulation and grip strength using various items for storage and translation using LUE.   Pt completed arm bike in sitting for 5 minutes with average RPM of 37 at level 8 for endurance, ROM, and strengthening of affected extremity using reciprocal arm movements. Pt alternating direction of pedaling halfway through. Intermittent cues provided to maintain stability with respect to anterior/posterior trunk lean and consistent grasp maintenance.   Therapist reviewed goals with patient and updated patient progression.  No additional functional limitations identified. Objective measures assessed as noted in Goals section to determine progression towards goals.  PATIENT EDUCATION: Education details: LUE strengthening; goal progression; OT d/c Person educated: Patient Education method: Explanation, Demonstration, and Verbal cues Education comprehension: verbalized understanding and returned demonstration  HOME EXERCISE PROGRAM: 02/09/24 - Affected UE ROM and Yellow Theraputty. Access Code: [email or web address]. 02/15/2024: L handed typing words 02/21/24 - Digit ext with theraputty or rubber band (access code: [email or web address]). Functional reach: Handwritten/Verbal instructions: 1 set in front of mirror, 1 set without mirror: Reaching overhead to touch top of head with affected hand, reaching overhead to touch back of head with affected hand, reaching arm forward and touching nose with affected UE - 10 reps, 2 sets, 2x per  day. 03/07/2024: Theraband; dowel 03/13/24 - graded up to pink theraputty. Reps/sets adjusted: Access Code: [email or web address]. 03/15/24 - Verbal instructions: Daily typing practice. LUE overhead reaching - Access Code: [email or web address]  GOALS: Goals reviewed with patient? Yes  SHORT TERM GOALS: Target date: 03/09/24  Pt will ind demo understanding of HEP using visual handouts. Baseline: new to outpt OT Goal status: MET  2.  Pt will ind recall at least 3 energy conservation strategies. Baseline: Activity tolerance: Pt reported I'm doing more than I was doing in the hospital, but I definitely get tired easier and take more breaks.  03/13/24 - Pt ind recalled breathing, spacing out activities throughout the day, naps, taking breaks PRN.  Goal status: MET  3.  Pt and/or family will return demo of personal e-stim unit use and electrode placement for affected UE.  Baseline: pt reported she has ordered a personal e-stim unit.  02/09/24 - Education and handout provided. Pt returned demo of using personal e-stim unit for wrist flex/ext of affected UE. Goal status: MET  4.  Pt will report improved ability to use LUE during overhead reaching tasks with minimal use of RUE to compensate, such as hair care tasks and washing hair. Baseline: pt reported difficulty with hair styling (e.g. putting hair in ponytail, curling hair) 03/13/24 - Pt reported continued difficulty with putting hair in a bun and washing hair. Goal status: MET  LONG TERM GOALS: Target date: 05/25/2024   Patient will report at least two-point increase in average PSFS score or at least three-point increase in a single activity score indicating functionally significant improvement given minimum detectable change using adaptive strategies and A/E PRN. Baseline: PSF: 3.0 total score (See above for individual activity scores)  03/15/24 - PSFS: 7.0 04/23/2024: 8.3 Goal status: MET and ongoing  2.  Patient will demonstrate at least  37 lbs LUE grip strength  as needed to open jars and other containers.  Baseline: Grip strength: Right: 48.9, 53.5, 53.3 (51.9 lbs average) lbs; Left: 24.2, 22.2, 22.7 (23.1 lbs average) lbs 03/07/2024: LUE - 33.1 lbs 04/23/2024: 32 lbs Goal status: MET on 03/07/24 and revised on 03/13/24  3.  Patient will demo improved FM coordination as  evidenced by completing nine-hole peg with use of LUE in 30 seconds or less.  Baseline: 9 Hole Peg test: Right: 20 sec; Left: 40 sec 03/07/2024: 34 sec 04/23/2024: 28 sec Goal status: MET  4.  Pt will verbalize understanding of A/E, adaptive strategies for functional tasks as needed for return to work considerations. Baseline: Work activities: Pt expressed concerns with FM coordination and concerns about return to work considerations, such as coordination required for drawing up syringes from a vial for medication administration, completing IVs, typing, navigating phone. Goal status:MET  ASSESSMENT:  CLINICAL IMPRESSION: Patient is appropriate for discharge and no longer demonstrates medical necessity for continued skilled occupational therapy services.  PLAN:  OT D/C Completed  CONSULTED AND AGREED WITH PLAN OF CARE: Patient   Jocelyn CHRISTELLA Bottom, OT 04/23/2024, 1:21 PM

## 2024-04-23 NOTE — Telephone Encounter (Signed)
 Pt came into office to cancel appt with dr. Onita 7/30. Pt states she does not want to continue getting injections in her leg.

## 2024-04-23 NOTE — Therapy (Signed)
 OUTPATIENT PHYSICAL THERAPY NEURO TREATMENT    Patient Name: Faith Jordan MRN: [id number] DOB:Oct 28, 1995, 28 y.o., female Today's Date: 04/23/2024   PCP: Gretta Comer POUR, NP REFERRING PROVIDER: Carilyn Prentice BRAVO, MD   END OF SESSION:  PT End of Session - 04/23/24 1107     Visit Number 23    Number of Visits 25   recert   Date for PT Re-Evaluation 05/10/24   recert, to allow for scheduling delays   Authorization Type [employer]    PT Start Time 1105    PT Stop Time 1145    PT Time Calculation (min) 40 min    Activity Tolerance Patient tolerated treatment well    Behavior During Therapy [facility] for tasks assessed/performed                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   Acute flank pain 06/02/2022   Angio-edema 04/27/2016   Angioedema    Aquagenic angio-edema-urticaria    Asthma    no problems /not used recently   Dysautonomia (HCC)    Dysrhythmia    sinus tachycardia   Fibroid    right breast, adenoma   Headache(784.0)    History of COVID-19 11/21/2020   Hives 12/22/2015   Pneumonia    hx  6th grade   Sensation of fullness in both ears 02/02/2023   Snoring 02/11/2022   Urinary frequency 02/12/2019   Vaginal yeast infection 06/02/2022   Vasculitis (HCC)    Past Surgical History:  Procedure Laterality Date   ADENOIDECTOMY     BREAST LUMPECTOMY Right    BREAST LUMPECTOMY Right    MASS EXCISION Right 10/07/2014   Procedure: EXCISION OF RIGHT BREAST MASS;  Surgeon: Krystal Russell, MD;  Location: [facility] OR;  Service: General;  Laterality: Right;   TONSILLECTOMY AND ADENOIDECTOMY     TOOTH EXTRACTION     TRANSESOPHAGEAL ECHOCARDIOGRAM (CATH LAB) N/A 01/10/2024   Procedure: TRANSESOPHAGEAL ECHOCARDIOGRAM;  Surgeon: Jeffrie Oneil BROCKS, MD;  Location: [facility] INVASIVE CV LAB;  Service: Cardiovascular;  Laterality: N/A;   Patient Active Problem List   Diagnosis Date Noted   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) 04/09/2024   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 (CVA) 04/05/2024   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) 02/10/2024   Coping style affecting medical condition 01/19/2024   History of CVA (cerebrovascular accident) 01/12/2024   Stroke of right basal ganglia (HCC) 01/07/2024   Non-restorative sleep 05/26/2022   Sleep paralysis, 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   Vivid dream 05/26/2022   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   Excessive daytime sleepiness 05/26/2022   Sleep disturbance 03/17/2022   Panic attacks 02/11/2022   Vitamin B12 deficiency 07/30/2021   Vitamin D  deficiency 07/30/2021   Family history of hypothyroidism 07/30/2021   GAD (generalized anxiety disorder) 03/18/2020   Fatigue 11/06/2019   Chronic back pain 07/06/2019   Preventative health care 04/27/2018   Migraines 04/25/2018   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   Anemia 01/04/2018   Headache disorder 12/29/2015   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   Sinus tachycardia 12/22/2015   Dysautonomia (HCC) 12/22/2015   Dysautonomia, familial (HCC) 04/22/2015   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   Neurocardiogenic syncope 04/06/2013   Intermittent palpitations 04/06/2013    ONSET DATE: 01/26/2024 (referral)   REFERRING DIAG:  I63.81 (ICD-10-CM) - Other cerebral infarction due  to occlusion or stenosis of small artery    THERAPY DIAG:  Muscle weakness (generalized)  Unsteadiness on feet  Other lack of coordination  Rationale for Evaluation and Treatment: Rehabilitation  SUBJECTIVE:                                                                                                                                                                                             SUBJECTIVE STATEMENT: Pt presents to clinic alone. Denies pain or falls. Reports she requested a letter to  return to work on 7/14.  Went swimming over the weekend and her left leg locked up on her again.   Pt accompanied by: self  PERTINENT HISTORY: familiar hemiplegic migraine, angioedema 2017,dysautonomia; infarct right internal capsule/ R basal ganglia stroke  PAIN:  Are you having pain? No    PRECAUTIONS: Fall  RED FLAGS: None   WEIGHT BEARING RESTRICTIONS: No  FALLS: Has patient fallen in last 6 months? No  LIVING ENVIRONMENT: Lives with: lives with their family Lives in: House/apartment Stairs: Yes: External: 4 steps; bilateral but cannot reach both Has following equipment at home: shower chair  PLOF: Independent  PATIENT GOALS: Being normal again. Being able to go back to work and be independent   OBJECTIVE:  Note: Objective measures were completed at Evaluation unless otherwise noted.  DIAGNOSTIC FINDINGS: MRI of brain on 01/07/24  IMPRESSION: 1. 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 2. Susceptibility related calcifications in the globus pallidus bilaterally. This likely reflects the sequela of chronic microvascular ischemia.  COGNITION: Overall cognitive status: Within functional limits for tasks assessed   SENSATION: Pt denies numbness/tingling in L hemibody   MUSCLE TONE: Non-fatiguing clonus of LLE  POSTURE: No Significant postural limitations  LOWER EXTREMITY ROM:     Active  Right Eval Left Eval  Hip flexion    Hip extension    Hip abduction    Hip adduction    Hip internal rotation    Hip external rotation    Knee flexion    Knee extension    Ankle dorsiflexion    Ankle plantarflexion    Ankle inversion    Ankle eversion     (Blank rows = not tested)  LOWER EXTREMITY MMT:  Tested in seated position   MMT Right Eval Left Eval  Hip flexion 5 4+  Hip extension    Hip abduction 5 5  Hip adduction 5 5  Hip internal rotation    Hip external rotation    Knee flexion 5 5  Knee  extension 5 4+  Ankle dorsiflexion 5  5  Ankle plantarflexion    Ankle inversion    Ankle eversion    (Blank rows = not tested)  BED MOBILITY:  Not tested  TRANSFERS: Sit to stand: Complete Independence  Assistive device utilized: None     Stand to sit: Complete Independence  Assistive device utilized: None      RAMP:  Not tested  CURB:  Not tested  GAIT: Gait pattern: step through pattern, decreased arm swing- Left, decreased step length- Right, decreased stance time- Left, decreased stride length, decreased hip/knee flexion- Left, lateral hip instability, and wide BOS Distance walked: Various clinic distances  Assistive device utilized: None Level of assistance: Modified independence Comments: Pt walks very guarded, as if she feels unstable. No instability noted other than single instance of L knee buckling which pt able to self-correct.    PATIENT SURVEYS:  ABC scale to be obtained    VITALS  There were no vitals filed for this visit.                                                                                                                              TREATMENT:   Ther Act/Self-care  Discussed POC moving forward as pt plans on returning to work next week. Pt in agreement to add more appointments and reduce frequency to 1x/week for four more weeks. Informed pt therapist will write plan for 1-2x/week in case she needs to add more appointments once she returns to work, pt verbalized understanding.  At ballet bar for improved ankle stability and proprioception. Had pt perform without shoes on:  Heel raises while squeezing 0.5 kg ball between heels and BUE support, x18 reps. Cues to rise onto big toes w/movement.  Resisted heel raises (biasing eversion) w/red theraband to work on posterior tib strength and inversion, x15 reps.  On mat table, dead bugs w/red theraball, x6 reps per side, for improved core stability, reciprocal coordination and hip flexor strength. Pt  extremely challenged by this but was able to flex L knee w/movement despite fatigue  Quadruped multifidus rotations on yoga block, x10 reps per side. Increased difficulty stabilizing on LLE  At counter, resisted mountain climbers (green theraband around feet), x10 reps per side for improved hip flexor strength and ankle stability. Pt performed well, so progressed to regular mountain climbers w/emphasis on speed and pt very challenged by this. Noted knee extensor thrust w/movement, but pt able to control well and no hyperextension noted.   Gait pattern: L knee extension thrust and step through pattern Distance walked:  various clinic distances  Assistive device utilized: None Level of assistance: Modified independence Comments: Pt demonstrates L knee extension thrust if fatigued but no hyperextension noted      PATIENT EDUCATION: Education details: continue HEP, PT POC Person educated: Patient Education method: Medical illustrator Education comprehension: verbalized understanding and returned demonstration  HOME EXERCISE PROGRAM: Access Code: [email or web address] URL: [email or web address] Date: 02/13/2024 Prepared by: Marlon Adalena Abdulla  Exercises -  Half Kneeling Lift  - 1 x daily - 7 x weekly - 1 sets - 10 reps - Down Dog with Bent Knees and Heels on Mat  - 1 x daily - 7 x weekly - 2-3 reps - 15-30 seconds hold - Bird Dog  - 1 x daily - 7 x weekly - 3 sets - 10 reps - Lateral Shuffles  - 1 x daily - 7 x weekly - 3 sets - 10 reps - Braided Sidestepping  - 1 x daily - 7 x weekly - 3 sets - 10 reps - Standing Balance Activity: Plyometric Modified Lower Extremity Jumping Jack  - 1 x daily - 7 x weekly - 3 sets - 10 reps - Standing Forward Step Taps with Counter Support  - 1 x daily - 7 x weekly - 3 sets - 10 reps (at bottom of steps with no UE support, red TB around ankles) - Ankle Inversion with Resistance  - 1 x daily - 7 x weekly - 3 sets - 10 reps - Modified Thomas Stretch   - 1 x daily - 7 x weekly - 2-3 sets - 1-2 minutes  hold - Supine Straight Leg Lumbar Rotation Stretch  - 1 x daily - 7 x weekly - 3 sets - 10 reps - Psoas Mobilization with Small Ball  - 1 x daily - 7 x weekly - 3 sets - 2-3 minute hold - Pigeon Pose  - 1 x daily - 7 x weekly - 1-2 sets - 1-2 minutes hold  From CIR Therapist: Also instructed pt in 2 additional exercises but no printed versions available: - tall kneeling to half kneeling - sit to half kneeling and then return to sit. Perform to each side.   Verbally added 02/21/24: -squats at countertop with chair behind you  Verbally added 03/13/24: - eccentric heel taps  - jumping over line fwd/retro/laterally   Verbally added 03/27/24  - Pallof press rotations w/red theraband   GOALS: Goals reviewed with patient? Yes  SHORT TERM GOALS: Target date: 02/28/2024  Pt will improve FGA to 28/30 for decreased fall risk  Baseline: 21/30, 25/30 (5/13) Goal status: IN PROGRESS  2.  HiMat to be assessed at STG check and LTG updated  Baseline: 11/54 (5/13) Goal status: MET  3.  Pt will improve gait velocity to at least 3.4 ft/s for improved gait efficiency and return to PLOF Baseline: 3.0 ft/sec (eval), 3.31 ft/sec (5/13) Goal status: IN PROGRESS  4.  ABC scale to be assessed and LTG updated  Baseline: 70.6% (5/13) Goal status: MET  5.  Pt will demonstrate proper lifting technique of 25# weight from floor for proper body mechanics and safety at work Baseline:  Goal status: MET  LONG TERM GOALS: Target date: 03/13/2024  Pt will improve gait velocity to at least 3.5 ft/s for improved gait efficiency and independence Baseline: 3.0 ft/sec (eval), 3.31 ft/sec (5/13), 3.57 ft/sec (5/29) Goal status: MET  2.  Pt will improve her score on the HiMat to >/= 16/54 to demonstrate improved balance and function Baseline: 11/54 (5/13), 21/54 (5/29) Goal status: MET  3.  Pt will improve her ABC scale score to >/= 80% for improved confidence in  her balance. Baseline: 70.6% (5/13), 90% (5/29) Goal status: MET  4.  Pt will be able to carry 10# object in BUEs up/down 4 steps for improved coordination and safety in work environment   Baseline: can only carry w/single UE, can carry with BUE mod I (  5/29) Goal status: MET  5.  Pt will perform [place] get up w/5# weight for improved shoulder stability OH, core stability and high level balance  Baseline: CGA to min A to perform (5/29) Goal status: MET  NEW SHORT TERM GOALS:   Target date: 04/05/2024  Pt will improve her score on the HiMat to >/= 26/54 to demonstrate improved balance and function Baseline: 11/54 (5/13), 21/54 (5/29), 13/54 (6/23) Goal status: NOT MET  2.  Pt will be able to perform 5 deep squats and return to standing to demonstrate improved function Baseline: 2 squats before unable to stand up without physical assist, 5 squats mod I (6/23) Goal status: MET  3.  Manual TUG to be assessed and LTG set Baseline: to be assessed Goal status: MET  4.  Cognitive TUG to be assessed and LTG set Baseline: to be assessed Goal status: MET    NEW LONG TERM GOALS:  Target date: 04/26/2024  Pt will improve gait velocity to at least 3.75 ft/s for improved gait efficiency and independence Baseline: 3.0 ft/sec (eval), 3.31 ft/sec (5/13), 3.57 ft/sec (5/29) Goal status: INITIAL  2.  Pt will improve her score on the HiMat to >/= 25/54 to demonstrate improved balance and function Baseline: 11/54 (5/13), 21/54 (5/29), 13/54 (6/23) Goal status: REVISED  3.  Pt will be able to perform 10 deep squats and return to standing to demonstrate improved function Baseline: 2 squats before unable to stand up without physical assist Goal status: INITIAL  4.  Pt will improve manual TUG to less than or equal to 12 seconds for improved functional mobility and decreased fall risk.  Baseline: 17.59s Goal status: INITIAL  5.  Pt will improve cognitive TUG to less than or equal to 10 seconds  for improved functional mobility and decreased fall risk.  Baseline: 12.35s counting backwards by 8 Goal status: INITIAL      ASSESSMENT:  CLINICAL IMPRESSION: Emphasis of skilled PT session on discussing POC moving forward, continued ankle and hip flexor strengthening and core stability. Pt in agreement to add more appointments at a frequency of 1x/week once she returns to work next week, so appointments added as appropriate. Pt continues to be limited by extensor tone in LLE, but was better controlled today w/activation of deep core muscle to assist in pelvic positioning of LLE. Pt demonstrates eversion of L ankle that is controlled w/use of heel wedge in shoe but reports she has been able to wear flip flops as well. Continue POC.   OBJECTIVE IMPAIRMENTS: Abnormal gait, decreased activity tolerance, decreased balance, decreased coordination, decreased endurance, decreased mobility, decreased strength, increased muscle spasms, impaired UE functional use, improper body mechanics, and pain  ACTIVITY LIMITATIONS: carrying, lifting, squatting, stairs, reach over head, hygiene/grooming, and locomotion level  PARTICIPATION LIMITATIONS: meal prep, cleaning, laundry, interpersonal relationship, driving, shopping, community activity, occupation, and yard work  PERSONAL FACTORS: 1 comorbidity: R internal capsule infarct/R basal ganglia stroke are also affecting patient's functional outcome.   REHAB POTENTIAL: Excellent  CLINICAL DECISION MAKING: Evolving/moderate complexity  EVALUATION COMPLEXITY: Moderate  PLAN:  PT FREQUENCY: 2-3x/week (starting w/2x following eval)  PT DURATION: 6 weeks  PLANNED INTERVENTIONS: 02835- PT Re-evaluation, 97750- Physical Performance Testing, 97110-Therapeutic exercises, 97530- Therapeutic activity, 97112- Neuromuscular re-education, 97535- Self Care, 02859- Manual therapy, [phone]- Gait training, [phone]- Canalith repositioning, [phone]- Aquatic Therapy,  Patient/Family education, Balance training, Stair training, Taping, Dry Needling, Joint mobilization, Spinal mobilization, Vestibular training, and DME instructions  PLAN FOR NEXT SESSION:  Goals and  recert High level agility, elliptical, L quad eccentric strength, OH tasks. Practice w/plastic needles Marvetta has these) in oranges and pin the needle to targets? Cognitive dual tasking, needs to work on deep squats and return to standing from squats, agility drills, treadmill training with focus on increasing speed and potentially running? Half kneel, ladder drills, carrying weight with both hands (on rockerboard, stairs), agility ladder. Jumping. Wobble bar, standing hip half circles on airex, hip flexor strength w/knees extended   Pt goals: squats (for patient care), time based tasks, not lose my balance (would lose balance with perturbations/reactive balance), multitasking/cog dual tasking (talking while doing tasks). Work simulation tasks (bed mobility, transfers, holding items)   Lucee Brissett E Savier Trickett, PT, DPT 04/23/2024, 11:49 AM

## 2024-04-23 NOTE — Telephone Encounter (Signed)
 Patient calling to let us  know that she is ready for a return to work note for return on 05/03/24. She can pick up anytime today or whenever it is ready.   Work restrictions:  Lifting up to 20lbs  Sitting for 10 mins every hour Working 4 hrs 2 days/week

## 2024-04-24 ENCOUNTER — Encounter: Payer: Self-pay | Admitting: Physical Medicine & Rehabilitation

## 2024-04-24 ENCOUNTER — Ambulatory Visit (INDEPENDENT_AMBULATORY_CARE_PROVIDER_SITE_OTHER): Admitting: Psychology

## 2024-04-24 ENCOUNTER — Other Ambulatory Visit: Payer: Self-pay

## 2024-04-24 DIAGNOSIS — F411 Generalized anxiety disorder: Secondary | ICD-10-CM

## 2024-04-24 DIAGNOSIS — F331 Major depressive disorder, recurrent, moderate: Secondary | ICD-10-CM | POA: Diagnosis not present

## 2024-04-24 DIAGNOSIS — F4323 Adjustment disorder with mixed anxiety and depressed mood: Secondary | ICD-10-CM | POA: Diagnosis not present

## 2024-04-24 NOTE — Progress Notes (Unsigned)
                edge,  experiencing concentration difficulties, having trouble falling or staying asleep, exhibiting a general  state of irritability).: No Description Entered (Status: improved). Motor tension (e.g., restlessness,  tiredness, shakiness, muscle tension).: No Description Entered (Status: improved).  Problems Addressed  Anxiety, Phase Of Life Problems, Anxiety  Goals 1. Learn and implement coping skills that result in a reduction of anxiety  and worry, and improved daily functioning. Objective Learn  and implement calming skills to reduce overall anxiety and manage anxiety symptoms. Target Date: 2025-08-09Frequency: Weekly Progress: 40 Modality: individual  Related Interventions 1. Teach the client calming/relaxation skills (e.g., applied relaxation, progressive muscle  relaxation, cue controlled relaxation; mindful breathing; biofeedback) and how to discriminate  better between relaxation and tension; teach the client how to apply these skills to his/her daily  life (e.g., New Directions in Progressive Muscle Relaxation by Marcelyn Ditty, and  Hazlett-Stevens; Treating Generalized Anxiety Disorder by Rygh and Ida Rogue). Objective Identify, challenge, and replace biased, fearful self-talk with positive, realistic, and empowering selftalk. Target Date: 2024-05-26 Frequency: weekly Progress: 30 Modality: individual Related Interventions 1. Explore the client's schema and self-talk that mediate his/her fear response; assist him/her in  challenging the biases; replace the distorted messages with reality-based alternatives and  positive, realistic self-talk that will increase his/her self-confidence in coping with irrational  fears (see Cognitive Therapy of Anxiety Disorders by Laurence Slate). Objective Learn and implement problem-solving strategies for realistically addressing worries. Target Date: 2025-08-09Frequency: weekly Progress: 40 Modality: individual 2. Resolve conflicted feelings and adapt to the new life circumstances. Objective Apply problem-solving skills to current circumstances. Target Date: 2024-05-26 Frequency: weekly Progress: 20 Modality: individual Related Interventions 1. Teach the client problem-resolution skills (e.g., defining the problem clearly, brainstorming  multiple solutions, listing the pros and cons of each solution, seeking input from others,  selecting and implementing a plan of action, evaluating outcome, and readjusting plan as   necessary).   3. Stabilize anxiety level while increasing ability to function on a daily  basis. Diagnosis F33.1  Major depressive disorder, moderate 300.02 (Generalized anxiety disorder) - Open - [Signifier: n/a]  Axis  none 309.28 (Adjustment disorder with mixed anxiety and depressed  mood) - Open - [Signifier: n/a]  Adjustment Disorder,  With Anxiety   Marital conflict  Major Depressive disorder, moderate  Conditions For Discharge Achievement of treatment goals and objectives.  The patient approved this plan.   Deonna Krummel G Ethridge Sollenberger, LCSW

## 2024-04-24 NOTE — Telephone Encounter (Signed)
 I completed a letter this morning and put it in my outbox

## 2024-04-25 ENCOUNTER — Ambulatory Visit: Admitting: Physical Therapy

## 2024-04-25 ENCOUNTER — Ambulatory Visit: Admitting: Occupational Therapy

## 2024-04-25 ENCOUNTER — Encounter: Payer: Self-pay | Admitting: Neurology

## 2024-04-25 DIAGNOSIS — R278 Other lack of coordination: Secondary | ICD-10-CM

## 2024-04-25 DIAGNOSIS — I69354 Hemiplegia and hemiparesis following cerebral infarction affecting left non-dominant side: Secondary | ICD-10-CM | POA: Diagnosis not present

## 2024-04-25 DIAGNOSIS — I6381 Other cerebral infarction due to occlusion or stenosis of small artery: Secondary | ICD-10-CM | POA: Diagnosis not present

## 2024-04-25 DIAGNOSIS — R29898 Other symptoms and signs involving the musculoskeletal system: Secondary | ICD-10-CM | POA: Diagnosis not present

## 2024-04-25 DIAGNOSIS — R2681 Unsteadiness on feet: Secondary | ICD-10-CM | POA: Diagnosis not present

## 2024-04-25 DIAGNOSIS — R29818 Other symptoms and signs involving the nervous system: Secondary | ICD-10-CM | POA: Diagnosis not present

## 2024-04-25 DIAGNOSIS — M6281 Muscle weakness (generalized): Secondary | ICD-10-CM | POA: Diagnosis not present

## 2024-04-25 DIAGNOSIS — R2689 Other abnormalities of gait and mobility: Secondary | ICD-10-CM | POA: Diagnosis not present

## 2024-04-25 NOTE — Therapy (Signed)
 OUTPATIENT PHYSICAL THERAPY NEURO TREATMENT - RECERTIFICATION   Patient Name: Faith Jordan MRN: [id number] DOB:[birth year], 28 y.o., female Today's Date: 04/25/2024   PCP: Gretta Comer POUR, NP REFERRING PROVIDER: Carilyn Prentice BRAVO, MD   END OF SESSION:  PT End of Session - 04/25/24 1103     Visit Number 24    Number of Visits 32   recert   Date for PT Re-Evaluation 05/30/24   recert   Authorization Type [employer]    PT Start Time 1102    PT Stop Time 1143    PT Time Calculation (min) 41 min    Activity Tolerance Patient tolerated treatment well    Behavior During Therapy [facility] for tasks assessed/performed                 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   Acute flank pain 06/02/2022   Angio-edema 04/27/2016   Angioedema    Aquagenic angio-edema-urticaria    Asthma    no problems /not used recently   Dysautonomia (HCC)    Dysrhythmia    sinus tachycardia   Fibroid    right breast, adenoma   Headache(784.0)    History of COVID-19 11/21/2020   Hives 12/22/2015   Pneumonia    hx  6th grade   Sensation of fullness in both ears 02/02/2023   Snoring 02/11/2022   Urinary frequency 02/12/2019   Vaginal yeast infection 06/02/2022   Vasculitis (HCC)    Past Surgical History:  Procedure Laterality Date   ADENOIDECTOMY     BREAST LUMPECTOMY Right    BREAST LUMPECTOMY Right    MASS EXCISION Right 10/07/2014   Procedure: EXCISION OF RIGHT BREAST MASS;  Surgeon: Krystal Russell, MD;  Location: [facility] OR;  Service: General;  Laterality: Right;   TONSILLECTOMY AND ADENOIDECTOMY     TOOTH EXTRACTION     TRANSESOPHAGEAL ECHOCARDIOGRAM (CATH LAB) N/A 01/10/2024   Procedure: TRANSESOPHAGEAL ECHOCARDIOGRAM;  Surgeon: Jeffrie Oneil BROCKS, MD;  Location: [facility] INVASIVE CV LAB;  Service: Cardiovascular;  Laterality: N/A;   Patient Active Problem List   Diagnosis Date Noted   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) 04/09/2024   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 (CVA) 04/05/2024   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) 02/10/2024   Coping style affecting medical condition 01/19/2024   History of CVA (cerebrovascular accident) 01/12/2024   Stroke of right basal ganglia (HCC) 01/07/2024   Non-restorative sleep 05/26/2022   Sleep paralysis, 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   Vivid dream 05/26/2022   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   Excessive daytime sleepiness 05/26/2022   Sleep disturbance 03/17/2022   Panic attacks 02/11/2022   Vitamin B12 deficiency 07/30/2021   Vitamin D  deficiency 07/30/2021   Family history of hypothyroidism 07/30/2021   GAD (generalized anxiety disorder) 03/18/2020   Fatigue 11/06/2019   Chronic back pain 07/06/2019   Preventative health care 04/27/2018   Migraines 04/25/2018   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   Anemia 01/04/2018   Headache disorder 12/29/2015   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   Sinus tachycardia 12/22/2015   Dysautonomia (HCC) 12/22/2015   Dysautonomia, familial (HCC) 04/22/2015   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   Neurocardiogenic syncope 04/06/2013   Intermittent palpitations 04/06/2013    ONSET DATE: 01/26/2024 (referral)   REFERRING DIAG:  I63.81 (ICD-10-CM) - Other cerebral infarction due to occlusion or  stenosis of small artery    THERAPY DIAG:  Muscle weakness (generalized)  Unsteadiness on feet  Other lack of coordination  Hemiplegia and hemiparesis following cerebral infarction affecting left non-dominant side (HCC)  Rationale for Evaluation and Treatment: Rehabilitation  SUBJECTIVE:                                                                                                                                                                                             SUBJECTIVE  STATEMENT: Pt reports doing well. Is working on Wednesday and Friday next week. No pain or falls to report   Pt accompanied by: self  PERTINENT HISTORY: familiar hemiplegic migraine, angioedema 2017,dysautonomia; infarct right internal capsule/ R basal ganglia stroke  PAIN:  Are you having pain? No    PRECAUTIONS: Fall  RED FLAGS: None   WEIGHT BEARING RESTRICTIONS: No  FALLS: Has patient fallen in last 6 months? No  LIVING ENVIRONMENT: Lives with: lives with their family Lives in: House/apartment Stairs: Yes: External: 4 steps; bilateral but cannot reach both Has following equipment at home: shower chair  PLOF: Independent  PATIENT GOALS: Being normal again. Being able to go back to work and be independent   OBJECTIVE:  Note: Objective measures were completed at Evaluation unless otherwise noted.  DIAGNOSTIC FINDINGS: MRI of brain on 01/07/24  IMPRESSION: 1. 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 2. Susceptibility related calcifications in the globus pallidus bilaterally. This likely reflects the sequela of chronic microvascular ischemia.  COGNITION: Overall cognitive status: Within functional limits for tasks assessed   SENSATION: Pt denies numbness/tingling in L hemibody   MUSCLE TONE: Non-fatiguing clonus of LLE  POSTURE: No Significant postural limitations  LOWER EXTREMITY ROM:     Active  Right Eval Left Eval  Hip flexion    Hip extension    Hip abduction    Hip adduction    Hip internal rotation    Hip external rotation    Knee flexion    Knee extension    Ankle dorsiflexion    Ankle plantarflexion    Ankle inversion    Ankle eversion     (Blank rows = not tested)  LOWER EXTREMITY MMT:  Tested in seated position   MMT Right Eval Left Eval  Hip flexion 5 4+  Hip extension    Hip abduction 5 5  Hip adduction 5 5  Hip internal rotation    Hip external rotation    Knee flexion 5 5   Knee extension 5 4+  Ankle dorsiflexion 5 5  Ankle plantarflexion    Ankle  inversion    Ankle eversion    (Blank rows = not tested)  BED MOBILITY:  Not tested  TRANSFERS: Sit to stand: Complete Independence  Assistive device utilized: None     Stand to sit: Complete Independence  Assistive device utilized: None      RAMP:  Not tested  CURB:  Not tested  GAIT: Gait pattern: step through pattern, decreased arm swing- Left, decreased step length- Right, decreased stance time- Left, decreased stride length, decreased hip/knee flexion- Left, lateral hip instability, and wide BOS Distance walked: Various clinic distances  Assistive device utilized: None Level of assistance: Modified independence Comments: Pt walks very guarded, as if she feels unstable. No instability noted other than single instance of L knee buckling which pt able to self-correct.    PATIENT SURVEYS:  ABC scale to be obtained    VITALS  There were no vitals filed for this visit.                                                                                                                              TREATMENT:   Physical Performance  SCORE  ITEM PERFORMANCE   0     1        2       3      4     5   WALK 7.3 sec    >6.6 5.4-6.6 4.3-5.3 <4.3     X  WALK BACKWARD 14.47 sec  >13.3 8.1-13.3 5.8-8.0 <5.8     X  WALK ON TOES 10.18 sec  >8.9 7.0-8.9 5.4-6.9 <5.4     X  WALK OVER OBSTACLE 7.75 sec  >7.1 5.4-7.1 4.5-5.3 <4.5     X  RUN 3.81 sec  >2.7 2.0-2.7 1.7-1.9 <1.7     X  SKIP 10.10 sec  >4.0 3.5-4.0 3.0-3.4 <3.0     X  HOP FORWARD (AFFECTED) Unable to perform X >7.0 5.3-7.0 4.1-5.2 <4.1     X  BOUND (AFFECTED) 1)102 cm 2)102 cm 3)118 cm  <80 80-103 104-132 >132     X  BOUND (LESS-AFFECTED) 1)117 cm 2)118 cm 3)119 cm  <82 82-105 106-129 >129     X  UP STAIRS DEPENDENT   >22.8 14.6-22.8 12.3-14.5 <12.3   UP STAIRS INDEPENDENT 8.75 sec  >9.1 7.6-9.1 6.8-7.5 <6.8     X  DOWN STAIRS DEPENDENT   >24.3  17.6-24.3 12.8-17.5 <12.8   DOWN STAIRS INDEPENDENT 6.79 SEC  >8.4 6.6-8.4 5.8-6.5 <5.8     X   SUBTOTAL  6 4 6  10   TOTAL SCORE 26/54          [facility] PT Assessment - 04/25/24 1111       Ambulation/Gait   Gait velocity 32.8' over 8.94s = 3.66 ft/s   no AD     Timed Up and Go Test   Manual TUG (seconds) 9.72   8.41s holding in RUE, 9.72s in LUE  Cognitive TUG (seconds) 7   retro couting by 8         Ther Act  Pt performed 10 deep squats w/no physical assistance but required cues to shift weight onto heels w/fatigue.  Discussed POC moving forward and pt reports she would like to continue working on her L quad and hip strength. Goals updated as appropriate  Pt held single leg stance on LLE for 10.18s, so will continue to progress this in PT.     Gait pattern: L knee extension thrust and step through pattern Distance walked:  various clinic distances  Assistive device utilized: None Level of assistance: Modified independence Comments: Pt demonstrates L knee extension thrust if fatigued but no hyperextension noted      PATIENT EDUCATION: Education details: goal results, continue HEP, POC moving forward  Person educated: Patient Education method: Medical illustrator Education comprehension: verbalized understanding and returned demonstration  HOME EXERCISE PROGRAM: Access Code: [email or web address] URL: [email or web address] Date: 02/13/2024 Prepared by: Marlon Jiovanni Heeter  Exercises - Half Kneeling Lift  - 1 x daily - 7 x weekly - 1 sets - 10 reps - Down Dog with Bent Knees and Heels on Mat  - 1 x daily - 7 x weekly - 2-3 reps - 15-30 seconds hold - Bird Dog  - 1 x daily - 7 x weekly - 3 sets - 10 reps - Lateral Shuffles  - 1 x daily - 7 x weekly - 3 sets - 10 reps - Braided Sidestepping  - 1 x daily - 7 x weekly - 3 sets - 10 reps - Standing Balance Activity: Plyometric Modified Lower Extremity Jumping Jack  - 1 x daily - 7 x weekly - 3 sets - 10 reps -  Standing Forward Step Taps with Counter Support  - 1 x daily - 7 x weekly - 3 sets - 10 reps (at bottom of steps with no UE support, red TB around ankles) - Ankle Inversion with Resistance  - 1 x daily - 7 x weekly - 3 sets - 10 reps - Modified Thomas Stretch  - 1 x daily - 7 x weekly - 2-3 sets - 1-2 minutes  hold - Supine Straight Leg Lumbar Rotation Stretch  - 1 x daily - 7 x weekly - 3 sets - 10 reps - Psoas Mobilization with Small Ball  - 1 x daily - 7 x weekly - 3 sets - 2-3 minute hold - Pigeon Pose  - 1 x daily - 7 x weekly - 1-2 sets - 1-2 minutes hold  From CIR Therapist: Also instructed pt in 2 additional exercises but no printed versions available: - tall kneeling to half kneeling - sit to half kneeling and then return to sit. Perform to each side.   Verbally added 02/21/24: -squats at countertop with chair behind you  Verbally added 03/13/24: - eccentric heel taps  - jumping over line fwd/retro/laterally   Verbally added 03/27/24  - Pallof press rotations w/red theraband   GOALS: Goals reviewed with patient? Yes  SHORT TERM GOALS:   Target date: 04/05/2024  Pt will improve her score on the HiMat to >/= 26/54 to demonstrate improved balance and function Baseline: 11/54 (5/13), 21/54 (5/29), 13/54 (6/23) Goal status: NOT MET  2.  Pt will be able to perform 5 deep squats and return to standing to demonstrate improved function Baseline: 2 squats before unable to stand up without physical assist, 5 squats mod I (6/23) Goal status: MET  3.  Manual  TUG to be assessed and LTG set Baseline: to be assessed Goal status: MET  4.  Cognitive TUG to be assessed and LTG set Baseline: to be assessed Goal status: MET    LONG TERM GOALS:  Target date: 04/26/2024  Pt will improve gait velocity to at least 3.75 ft/s for improved gait efficiency and independence Baseline: 3.0 ft/sec (eval), 3.31 ft/sec (5/13), 3.57 ft/sec (5/29); 3.66 ft/s (7/9) Goal status: IN PROGRESS  2.  Pt  will improve her score on the HiMat to >/= 25/54 to demonstrate improved balance and function Baseline: 11/54 (5/13), 21/54 (5/29), 13/54 (6/23); 26/54 (7/9) Goal status: MET  3.  Pt will be able to perform 10 deep squats and return to standing to demonstrate improved function Baseline: 2 squats before unable to stand up without physical assist Goal status: MET  4.  Pt will improve manual TUG to less than or equal to 12 seconds for improved functional mobility and decreased fall risk.  Baseline: 17.59s; 9.72s holding cup in LUE Goal status: MET  5.  Pt will improve cognitive TUG to less than or equal to 10 seconds for improved functional mobility and decreased fall risk.  Baseline: 12.35s counting backwards by 8; 7s retro counting by 8 Goal status: MET   NEW LONG TERM GOALS:  Target date: 05/24/2024  Pt will improve gait velocity to at least 3.75 ft/s for improved gait efficiency and independence Baseline: 3.0 ft/sec (eval), 3.31 ft/sec (5/13), 3.57 ft/sec (5/29); 3.66 ft/s (7/9) Goal status: IN PROGRESS  2.  Pt will hold single leg stance on LLE for >/= 20s for improved hip abductor strength, reduced fall risk and ankle proprioception  Baseline: 10.18s Goal status: INITIAL   ASSESSMENT:  CLINICAL IMPRESSION: Emphasis of skilled PT session on LTG assessment and discussing POC moving forward. Pt has met 4 of 5 LTGs, improving her score on hiMat to 26/54 and performing 10 deep squats without physical assistance. Pt also improved her score on manual and cog TUG from baseline, indicative of low fall risk. Pt has improved her gait speed from evaluation but did not meet her LTG, so will continue to progress in therapy. Pt continues to be limited by impaired coordination of LLE and extensor tone, but is ready to return to work part-time. Will continue working in PT to strengthen LLE, improve coordination and practice work-specific tasks. Continue POC.   OBJECTIVE IMPAIRMENTS: Abnormal  gait, decreased activity tolerance, decreased balance, decreased coordination, decreased endurance, decreased mobility, decreased strength, increased muscle spasms, impaired UE functional use, improper body mechanics, and pain  ACTIVITY LIMITATIONS: carrying, lifting, squatting, stairs, reach over head, hygiene/grooming, and locomotion level  PARTICIPATION LIMITATIONS: meal prep, cleaning, laundry, interpersonal relationship, driving, shopping, community activity, occupation, and yard work  PERSONAL FACTORS: 1 comorbidity: R internal capsule infarct/R basal ganglia stroke are also affecting patient's functional outcome.   REHAB POTENTIAL: Excellent  CLINICAL DECISION MAKING: Evolving/moderate complexity  EVALUATION COMPLEXITY: Moderate  PLAN:  PT FREQUENCY: 1-2x/week  PT DURATION: 6 weeks + 4 weeks (recert)   PLANNED INTERVENTIONS: 02835- PT Re-evaluation, 97750- Physical Performance Testing, 97110-Therapeutic exercises, 97530- Therapeutic activity, [id number]- Neuromuscular re-education, 97535- Self Care, [id number]- Manual therapy, [id number]- Gait training, [phone]- Canalith repositioning, [phone]- Aquatic Therapy, Patient/Family education, Balance training, Stair training, Taping, Dry Needling, Joint mobilization, Spinal mobilization, Vestibular training, and DME instructions  PLAN FOR NEXT SESSION: High level agility, elliptical, L quad eccentric strength, OH tasks. Practice w/plastic needles Marvetta has these) in oranges and pin the needle to targets?  Cognitive dual tasking, needs to work on deep squats and return to standing from squats, agility drills, treadmill training with focus on increasing speed and potentially running? Half kneel, ladder drills, carrying weight with both hands (on rockerboard, stairs), agility ladder. Jumping. Wobble bar, standing hip half circles on airex, hip flexor strength w/knees extended   Pt goals: squats (for patient care), time based tasks, not lose my balance (would lose  balance with perturbations/reactive balance), multitasking/cog dual tasking (talking while doing tasks). Work simulation tasks (bed mobility, transfers, holding items)   Harmani Neto E Jamacia Jester, PT, DPT 04/25/2024, 12:06 PM

## 2024-04-25 NOTE — Telephone Encounter (Signed)
 Patient states she does not wish to pursue Xeomin at this time.

## 2024-04-30 ENCOUNTER — Other Ambulatory Visit: Payer: Self-pay

## 2024-05-01 ENCOUNTER — Encounter: Payer: Self-pay | Admitting: Speech Pathology

## 2024-05-02 ENCOUNTER — Ambulatory Visit: Payer: Self-pay | Admitting: Physical Therapy

## 2024-05-02 DIAGNOSIS — M6281 Muscle weakness (generalized): Secondary | ICD-10-CM | POA: Diagnosis not present

## 2024-05-02 DIAGNOSIS — R2681 Unsteadiness on feet: Secondary | ICD-10-CM | POA: Diagnosis not present

## 2024-05-02 DIAGNOSIS — R278 Other lack of coordination: Secondary | ICD-10-CM

## 2024-05-02 DIAGNOSIS — R29818 Other symptoms and signs involving the nervous system: Secondary | ICD-10-CM | POA: Diagnosis not present

## 2024-05-02 DIAGNOSIS — I69354 Hemiplegia and hemiparesis following cerebral infarction affecting left non-dominant side: Secondary | ICD-10-CM | POA: Diagnosis not present

## 2024-05-02 DIAGNOSIS — I6381 Other cerebral infarction due to occlusion or stenosis of small artery: Secondary | ICD-10-CM | POA: Diagnosis not present

## 2024-05-02 DIAGNOSIS — R29898 Other symptoms and signs involving the musculoskeletal system: Secondary | ICD-10-CM | POA: Diagnosis not present

## 2024-05-02 DIAGNOSIS — R2689 Other abnormalities of gait and mobility: Secondary | ICD-10-CM | POA: Diagnosis not present

## 2024-05-02 NOTE — Therapy (Signed)
 OUTPATIENT PHYSICAL THERAPY NEURO TREATMENT    Patient Name: Faith Jordan MRN: [id number] DOB:February 01, 1996, 28 y.o., female Today's Date: 05/02/2024   PCP: Gretta Comer POUR, NP REFERRING PROVIDER: Carilyn Prentice BRAVO, MD   END OF SESSION:  PT End of Session - 05/02/24 0936     Visit Number 25    Number of Visits 32   recert   Date for PT Re-Evaluation 05/30/24   recert   Authorization Type [employer]    PT Start Time [phone]   Therapist running behind   PT Stop Time 1015    PT Time Calculation (min) 40 min    Activity Tolerance Patient tolerated treatment well    Behavior During Therapy [facility] for tasks assessed/performed                 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   Acute flank pain 06/02/2022   Angio-edema 04/27/2016   Angioedema    Aquagenic angio-edema-urticaria    Asthma    no problems /not used recently   Dysautonomia (HCC)    Dysrhythmia    sinus tachycardia   Fibroid    right breast, adenoma   Headache(784.0)    History of COVID-19 11/21/2020   Hives 12/22/2015   Pneumonia    hx  6th grade   Sensation of fullness in both ears 02/02/2023   Snoring 02/11/2022   Urinary frequency 02/12/2019   Vaginal yeast infection 06/02/2022   Vasculitis (HCC)    Past Surgical History:  Procedure Laterality Date   ADENOIDECTOMY     BREAST LUMPECTOMY Right    BREAST LUMPECTOMY Right    MASS EXCISION Right 10/07/2014   Procedure: EXCISION OF RIGHT BREAST MASS;  Surgeon: Krystal Russell, MD;  Location: [facility] OR;  Service: General;  Laterality: Right;   TONSILLECTOMY AND ADENOIDECTOMY     TOOTH EXTRACTION     TRANSESOPHAGEAL ECHOCARDIOGRAM (CATH LAB) N/A 01/10/2024   Procedure: TRANSESOPHAGEAL ECHOCARDIOGRAM;  Surgeon: Jeffrie Oneil BROCKS, MD;  Location: [facility] INVASIVE CV LAB;  Service: Cardiovascular;  Laterality: N/A;   Patient Active Problem List   Diagnosis Date Noted   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) 04/09/2024   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 (CVA) 04/05/2024   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) 02/10/2024   Coping style affecting medical condition 01/19/2024   History of CVA (cerebrovascular accident) 01/12/2024   Stroke of right basal ganglia (HCC) 01/07/2024   Non-restorative sleep 05/26/2022   Sleep paralysis, 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   Vivid dream 05/26/2022   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   Excessive daytime sleepiness 05/26/2022   Sleep disturbance 03/17/2022   Panic attacks 02/11/2022   Vitamin B12 deficiency 07/30/2021   Vitamin D  deficiency 07/30/2021   Family history of hypothyroidism 07/30/2021   GAD (generalized anxiety disorder) 03/18/2020   Fatigue 11/06/2019   Chronic back pain 07/06/2019   Preventative health care 04/27/2018   Migraines 04/25/2018   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   Anemia 01/04/2018   Headache disorder 12/29/2015   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   Sinus tachycardia 12/22/2015   Dysautonomia (HCC) 12/22/2015   Dysautonomia, familial (HCC) 04/22/2015   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   Neurocardiogenic syncope 04/06/2013   Intermittent palpitations 04/06/2013    ONSET DATE: 01/26/2024 (referral)   REFERRING DIAG:  I63.81 (ICD-10-CM) - Other cerebral infarction due  to occlusion or stenosis of small artery    THERAPY DIAG:  Muscle weakness (generalized)  Unsteadiness on feet  Other lack of coordination  Rationale for Evaluation and Treatment: Rehabilitation  SUBJECTIVE:                                                                                                                                                                                             SUBJECTIVE STATEMENT: Pt reports being fine. Rode a stationary bike this weekend, could only tolerate 5  minutes and felt her R thigh burning. Did not feel her LLE working as much but thinks it was because she was trying to avoid setting off clonus. No falls. Has a mild headache today. Goes back to work today.   Pt accompanied by: self  PERTINENT HISTORY: familiar hemiplegic migraine, angioedema 2017,dysautonomia; infarct right internal capsule/ R basal ganglia stroke  PAIN:  Are you having pain? Yes: NPRS scale: 2/10 Pain location: Head  Pain description: headache     PRECAUTIONS: Fall  RED FLAGS: None   WEIGHT BEARING RESTRICTIONS: No  FALLS: Has patient fallen in last 6 months? No  LIVING ENVIRONMENT: Lives with: lives with their family Lives in: House/apartment Stairs: Yes: External: 4 steps; bilateral but cannot reach both Has following equipment at home: shower chair  PLOF: Independent  PATIENT GOALS: Being normal again. Being able to go back to work and be independent   OBJECTIVE:  Note: Objective measures were completed at Evaluation unless otherwise noted.  DIAGNOSTIC FINDINGS: MRI of brain on 01/07/24  IMPRESSION: 1. 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 2. Susceptibility related calcifications in the globus pallidus bilaterally. This likely reflects the sequela of chronic microvascular ischemia.  COGNITION: Overall cognitive status: Within functional limits for tasks assessed   SENSATION: Pt denies numbness/tingling in L hemibody   MUSCLE TONE: Non-fatiguing clonus of LLE  POSTURE: No Significant postural limitations  LOWER EXTREMITY ROM:     Active  Right Eval Left Eval  Hip flexion    Hip extension    Hip abduction    Hip adduction    Hip internal rotation    Hip external rotation    Knee flexion    Knee extension    Ankle dorsiflexion    Ankle plantarflexion    Ankle inversion    Ankle eversion     (Blank rows = not tested)  LOWER EXTREMITY MMT:  Tested in seated position   MMT  Right Eval Left Eval  Hip flexion 5 4+  Hip extension    Hip abduction 5 5  Hip adduction 5 5  Hip internal rotation    Hip external rotation    Knee flexion 5 5  Knee extension 5 4+  Ankle dorsiflexion 5 5  Ankle plantarflexion    Ankle inversion    Ankle eversion    (Blank rows = not tested)  BED MOBILITY:  Not tested  TRANSFERS: Sit to stand: Complete Independence  Assistive device utilized: None     Stand to sit: Complete Independence  Assistive device utilized: None      RAMP:  Not tested  CURB:  Not tested  GAIT: Gait pattern: step through pattern, decreased arm swing- Left, decreased step length- Right, decreased stance time- Left, decreased stride length, decreased hip/knee flexion- Left, lateral hip instability, and wide BOS Distance walked: Various clinic distances  Assistive device utilized: None Level of assistance: Modified independence Comments: Pt walks very guarded, as if she feels unstable. No instability noted other than single instance of L knee buckling which pt able to self-correct.    PATIENT SURVEYS:  ABC scale to be obtained    VITALS  There were no vitals filed for this visit.                                                                                                                              TREATMENT:   Ther Act  The following were performed for improved hip mobility, BLE strength and posterior chain strength. Revised HEP as well (see bolded below):  Half kneel hip flexor stretch, x2 reps per side w/90s hold. Pt reported increased stretch on LLE > RLE and was able to stabilize independently. Educated pt on performing at home gym and using UE support on squat rack if needed Hip adductor rocks on mat table, x10 reps per side  Double leg presses, 12 reps at 80#. Noted increased tremor of RLE. Progressed to single leg presses, x10 reps at 80# on RLE. Started w/3 reps of 70# on LLE, but caused extensor tone to kick in so reduced weight  to 60# for remaining 7 reps. Performed 2 sets on LLE only w/min cues for slow and controlled movement.  Supine straight leg raises w/PPT, x12 reps per side. Pt performed well, so progressed to single leg bridge holds w/straight leg raise, x7 reps per side. Min cues to perform slow and controlled.    Gait pattern: L knee extension thrust and step through pattern Distance walked:  various clinic distances  Assistive device utilized: None Level of assistance: Modified independence Comments: Pt demonstrates L knee extension thrust if fatigued but no hyperextension noted      PATIENT EDUCATION: Education details: Updated HEP Person educated: Patient Education method: Medical illustrator Education comprehension: verbalized understanding and returned demonstration  HOME EXERCISE PROGRAM: Access Code: [email or web address] URL: [email or web address] Date: 05/02/2024 Prepared by: Marlon Wirt Hemmerich  Exercises - Half Kneeling Hip Flexor Stretch  - 1 x daily - 7 x weekly - 3 sets - 10 reps -  Kneeling adductor stretch   - 1 x daily - 7 x weekly - 3 sets - 10 reps - Single leg bridge with leg lift   - 1 x daily - 7 x weekly - 1-2 sets - 7-10 reps - Modified Thomas Stretch  - 1 x daily - 7 x weekly - 2-3 sets - 1-2 minutes hold - Supine Straight Leg Lumbar Rotation Stretch  - 1 x daily - 7 x weekly - 3 sets - 10 reps - Psoas Mobilization with Small Ball  - 1 x daily - 7 x weekly - 3 sets - 2-3 minute  hold - Pigeon Pose  - 1 x daily - 7 x weekly - 1-2 sets - 1-2 minutes hold  Verbally added 03/27/24  - Pallof press rotations w/red theraband   GOALS: Goals reviewed with patient? Yes  SHORT TERM GOALS:   Target date: 04/05/2024  Pt will improve her score on the HiMat to >/= 26/54 to demonstrate improved balance and function Baseline: 11/54 (5/13), 21/54 (5/29), 13/54 (6/23) Goal status: NOT MET  2.  Pt will be able to perform 5 deep squats and return to standing to demonstrate  improved function Baseline: 2 squats before unable to stand up without physical assist, 5 squats mod I (6/23) Goal status: MET  3.  Manual TUG to be assessed and LTG set Baseline: to be assessed Goal status: MET  4.  Cognitive TUG to be assessed and LTG set Baseline: to be assessed Goal status: MET    LONG TERM GOALS:  Target date: 04/26/2024  Pt will improve gait velocity to at least 3.75 ft/s for improved gait efficiency and independence Baseline: 3.0 ft/sec (eval), 3.31 ft/sec (5/13), 3.57 ft/sec (5/29); 3.66 ft/s (7/9) Goal status: IN PROGRESS  2.  Pt will improve her score on the HiMat to >/= 25/54 to demonstrate improved balance and function Baseline: 11/54 (5/13), 21/54 (5/29), 13/54 (6/23); 26/54 (7/9) Goal status: MET  3.  Pt will be able to perform 10 deep squats and return to standing to demonstrate improved function Baseline: 2 squats before unable to stand up without physical assist Goal status: MET  4.  Pt will improve manual TUG to less than or equal to 12 seconds for improved functional mobility and decreased fall risk.  Baseline: 17.59s; 9.72s holding cup in LUE Goal status: MET  5.  Pt will improve cognitive TUG to less than or equal to 10 seconds for improved functional mobility and decreased fall risk.  Baseline: 12.35s counting backwards by 8; 7s retro counting by 8 Goal status: MET   NEW LONG TERM GOALS:  Target date: 05/24/2024  Pt will improve gait velocity to at least 3.75 ft/s for improved gait efficiency and independence Baseline: 3.0 ft/sec (eval), 3.31 ft/sec (5/13), 3.57 ft/sec (5/29); 3.66 ft/s (7/9) Goal status: IN PROGRESS  2.  Pt will hold single leg stance on LLE for >/= 20s for improved hip abductor strength, reduced fall risk and ankle proprioception  Baseline: 10.18s Goal status: INITIAL   ASSESSMENT:  CLINICAL IMPRESSION: Emphasis of skilled PT session on revising HEP, hip flexor strength/mobility and functional BLE strength. Pt  reports her home gym is almost ready and she has been working mostly on swimming, walking and riding her stationary bike. Pt continues to be limited by extensor tone in LLE, triggered by weakness/tightness of hip flexors. Pt tolerated session well but did have increased tone in LLE w/single leg bridges w/straight leg raises. Pt is returning to work  today and is excited. Continue POC.   OBJECTIVE IMPAIRMENTS: Abnormal gait, decreased activity tolerance, decreased balance, decreased coordination, decreased endurance, decreased mobility, decreased strength, increased muscle spasms, impaired UE functional use, improper body mechanics, and pain  ACTIVITY LIMITATIONS: carrying, lifting, squatting, stairs, reach over head, hygiene/grooming, and locomotion level  PARTICIPATION LIMITATIONS: meal prep, cleaning, laundry, interpersonal relationship, driving, shopping, community activity, occupation, and yard work  PERSONAL FACTORS: 1 comorbidity: R internal capsule infarct/R basal ganglia stroke are also affecting patient's functional outcome.   REHAB POTENTIAL: Excellent  CLINICAL DECISION MAKING: Evolving/moderate complexity  EVALUATION COMPLEXITY: Moderate  PLAN:  PT FREQUENCY: 1-2x/week  PT DURATION: 6 weeks + 4 weeks (recert)   PLANNED INTERVENTIONS: 02835- PT Re-evaluation, 97750- Physical Performance Testing, 97110-Therapeutic exercises, 97530- Therapeutic activity, [id number]- Neuromuscular re-education, 97535- Self Care, [id number]- Manual therapy, [phone]- Gait training, [phone]- Canalith repositioning, [phone]- Aquatic Therapy, Patient/Family education, Balance training, Stair training, Taping, Dry Needling, Joint mobilization, Spinal mobilization, Vestibular training, and DME instructions  PLAN FOR NEXT SESSION: How was work? High level agility, elliptical, L quad eccentric strength, OH tasks. Practice w/plastic needles Marvetta has these) in oranges and pin the needle to targets? Cognitive dual tasking,  needs to work on deep squats and return to standing from squats, agility drills, treadmill training with focus on increasing speed and potentially running? Half kneel, ladder drills, carrying weight with both hands (on rockerboard, stairs), agility ladder. Jumping. Wobble bar, standing hip half circles on airex, hip flexor strength w/knees extended   Pt goals: squats (for patient care), time based tasks, not lose my balance (would lose balance with perturbations/reactive balance), multitasking/cog dual tasking (talking while doing tasks). Work simulation tasks (bed mobility, transfers, holding items)   Kimmarie Pascale E Shayleigh Bouldin, PT, DPT 05/02/2024, 10:16 AM

## 2024-05-08 ENCOUNTER — Ambulatory Visit (INDEPENDENT_AMBULATORY_CARE_PROVIDER_SITE_OTHER): Admitting: Psychology

## 2024-05-08 ENCOUNTER — Ambulatory Visit: Payer: Self-pay | Admitting: Physical Therapy

## 2024-05-08 DIAGNOSIS — I6381 Other cerebral infarction due to occlusion or stenosis of small artery: Secondary | ICD-10-CM | POA: Diagnosis not present

## 2024-05-08 DIAGNOSIS — F411 Generalized anxiety disorder: Secondary | ICD-10-CM

## 2024-05-08 DIAGNOSIS — R29898 Other symptoms and signs involving the musculoskeletal system: Secondary | ICD-10-CM

## 2024-05-08 DIAGNOSIS — R29818 Other symptoms and signs involving the nervous system: Secondary | ICD-10-CM

## 2024-05-08 DIAGNOSIS — R2689 Other abnormalities of gait and mobility: Secondary | ICD-10-CM | POA: Diagnosis not present

## 2024-05-08 DIAGNOSIS — I69354 Hemiplegia and hemiparesis following cerebral infarction affecting left non-dominant side: Secondary | ICD-10-CM | POA: Diagnosis not present

## 2024-05-08 DIAGNOSIS — F429 Obsessive-compulsive disorder, unspecified: Secondary | ICD-10-CM | POA: Diagnosis not present

## 2024-05-08 DIAGNOSIS — F331 Major depressive disorder, recurrent, moderate: Secondary | ICD-10-CM | POA: Diagnosis not present

## 2024-05-08 DIAGNOSIS — R2681 Unsteadiness on feet: Secondary | ICD-10-CM

## 2024-05-08 DIAGNOSIS — M6281 Muscle weakness (generalized): Secondary | ICD-10-CM | POA: Diagnosis not present

## 2024-05-08 DIAGNOSIS — R278 Other lack of coordination: Secondary | ICD-10-CM | POA: Diagnosis not present

## 2024-05-08 NOTE — Therapy (Signed)
 OUTPATIENT PHYSICAL THERAPY NEURO TREATMENT    Patient Name: Faith Jordan MRN: [id number] DOB:11-21-1995, 28 y.o., female Today's Date: 05/08/2024   PCP: Gretta Comer POUR, NP REFERRING PROVIDER: Carilyn Prentice BRAVO, MD   END OF SESSION:  PT End of Session - 05/08/24 0846     Visit Number 26    Number of Visits 32   recert   Date for PT Re-Evaluation 05/30/24   recert   Authorization Type [employer]    PT Start Time 0845    PT Stop Time [id number]    PT Time Calculation (min) 40 min    Equipment Utilized During Treatment Gait belt    Activity Tolerance Patient tolerated treatment well    Behavior During Therapy [facility] for tasks assessed/performed                  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   Acute flank pain 06/02/2022   Angio-edema 04/27/2016   Angioedema    Aquagenic angio-edema-urticaria    Asthma    no problems /not used recently   Dysautonomia (HCC)    Dysrhythmia    sinus tachycardia   Fibroid    right breast, adenoma   Headache(784.0)    History of COVID-19 11/21/2020   Hives 12/22/2015   Pneumonia    hx  6th grade   Sensation of fullness in both ears 02/02/2023   Snoring 02/11/2022   Urinary frequency 02/12/2019   Vaginal yeast infection 06/02/2022   Vasculitis (HCC)    Past Surgical History:  Procedure Laterality Date   ADENOIDECTOMY     BREAST LUMPECTOMY Right    BREAST LUMPECTOMY Right    MASS EXCISION Right 10/07/2014   Procedure: EXCISION OF RIGHT BREAST MASS;  Surgeon: Krystal Russell, MD;  Location: [facility] OR;  Service: General;  Laterality: Right;   TONSILLECTOMY AND ADENOIDECTOMY     TOOTH EXTRACTION     TRANSESOPHAGEAL ECHOCARDIOGRAM (CATH LAB) N/A 01/10/2024   Procedure: TRANSESOPHAGEAL ECHOCARDIOGRAM;  Surgeon: Jeffrie Oneil BROCKS, MD;  Location: [facility] INVASIVE CV LAB;  Service: Cardiovascular;  Laterality: N/A;   Patient Active Problem List   Diagnosis Date Noted    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) 04/09/2024   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 (CVA) 04/05/2024   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) 02/10/2024   Coping style affecting medical condition 01/19/2024   History of CVA (cerebrovascular accident) 01/12/2024   Stroke of right basal ganglia (HCC) 01/07/2024   Non-restorative sleep 05/26/2022   Sleep paralysis, 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   Vivid dream 05/26/2022   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   Excessive daytime sleepiness 05/26/2022   Sleep disturbance 03/17/2022   Panic attacks 02/11/2022   Vitamin B12 deficiency 07/30/2021   Vitamin D  deficiency 07/30/2021   Family history of hypothyroidism 07/30/2021   GAD (generalized anxiety disorder) 03/18/2020   Fatigue 11/06/2019   Chronic back pain 07/06/2019   Preventative health care 04/27/2018   Migraines 04/25/2018   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   Anemia 01/04/2018   Headache disorder 12/29/2015   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   Sinus tachycardia 12/22/2015   Dysautonomia (HCC) 12/22/2015   Dysautonomia, familial (HCC) 04/22/2015   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   Neurocardiogenic syncope 04/06/2013   Intermittent palpitations 04/06/2013    ONSET DATE: 01/26/2024 (referral)   REFERRING DIAG:  I63.81 (  ICD-10-CM) - Other cerebral infarction due to occlusion or stenosis of small artery    THERAPY DIAG:  Muscle weakness (generalized)  Unsteadiness on feet  Other lack of coordination  Other symptoms and signs involving the nervous system  Other symptoms and signs involving the musculoskeletal system  Other abnormalities of gait and mobility  Rationale for Evaluation and Treatment: Rehabilitation  SUBJECTIVE:                                                                                                                                                                                              SUBJECTIVE STATEMENT:  Pt has been 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 She reports she has been able to go at her own pace, is being floated to different areas. Pt has noticed the clonus getting worse in her LLE at the end of her shifts. Typically she was only taking an AM and a PM dose of her Baclofen , has needed her 3rd mid-day dose as well for the clonus. She isn't feeling too fatigued as a side effect of the Baclofen  but does sleep a lot the next day after working a shift. She also feels like it is really hard to walk fast and that her LLE doesn't keep up. I feel like a baby horse. She feels like it is more the strength and control of her L leg that is an issue regarding trying to walk fast vs the clonus. She did cancel her Botox  injection for her leg after discussing with Dr. MARLA as they both feel the clonus is not affecting her walking at this point.  She feels like her sciatica symptoms have improved but does report an aching pain in her R quads frequently. She has been having ongoing nausea since her stroke but it has gotten worse over the past week. She has a follow-up with her neurologist in a few weeks and with Dr. MARLA on 8/7 so will bring it up at those appointments.  She is going to try to increase her frequency at work to 3x/week from 2x/week. She also feels like she could work longer shifts than 4 hours.  Her home gym setup is almost done. She has an elliptical, treadmill, and weights/dumbbells. She also was working on walking a lot but over the past week with the heat and the rain she has not done as much walking.   Pt accompanied by: self  PERTINENT HISTORY: familiar hemiplegic migraine, angioedema 2017,dysautonomia; infarct right internal capsule/ R basal ganglia stroke  PAIN:  Are you having pain? No  PRECAUTIONS: Fall  RED FLAGS: None   WEIGHT BEARING  RESTRICTIONS: No  FALLS: Has patient fallen in last 6 months? No  LIVING ENVIRONMENT: Lives with: lives with their family Lives in: House/apartment Stairs: Yes: External: 4 steps; bilateral but cannot reach both Has following equipment at home: shower chair  PLOF: Independent  PATIENT GOALS: Being normal again. Being able to go back to work and be independent   OBJECTIVE:  Note: Objective measures were completed at Evaluation unless otherwise noted.  DIAGNOSTIC FINDINGS: MRI of brain on 01/07/24  IMPRESSION: 1. 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 2. Susceptibility related calcifications in the globus pallidus bilaterally. This likely reflects the sequela of chronic microvascular ischemia.  COGNITION: Overall cognitive status: Within functional limits for tasks assessed   SENSATION: Pt denies numbness/tingling in L hemibody   MUSCLE TONE: Non-fatiguing clonus of LLE  POSTURE: No Significant postural limitations  LOWER EXTREMITY ROM:     Active  Right Eval Left Eval  Hip flexion    Hip extension    Hip abduction    Hip adduction    Hip internal rotation    Hip external rotation    Knee flexion    Knee extension    Ankle dorsiflexion    Ankle plantarflexion    Ankle inversion    Ankle eversion     (Blank rows = not tested)  LOWER EXTREMITY MMT:  Tested in seated position   MMT Right Eval Left Eval  Hip flexion 5 4+  Hip extension    Hip abduction 5 5  Hip adduction 5 5  Hip internal rotation    Hip external rotation    Knee flexion 5 5  Knee extension 5 4+  Ankle dorsiflexion 5 5  Ankle plantarflexion    Ankle inversion    Ankle eversion    (Blank rows = not tested)  BED MOBILITY:  Not tested  TRANSFERS: Sit to stand: Complete Independence  Assistive device utilized: None     Stand to sit: Complete Independence  Assistive device utilized: None      RAMP:  Not tested  CURB:  Not  tested  GAIT: Gait pattern: step through pattern, decreased arm swing- Left, decreased step length- Right, decreased stance time- Left, decreased stride length, decreased hip/knee flexion- Left, lateral hip instability, and wide BOS Distance walked: Various clinic distances  Assistive device utilized: None Level of assistance: Modified independence Comments: Pt walks very guarded, as if she feels unstable. No instability noted other than single instance of L knee buckling which pt able to self-correct.    PATIENT SURVEYS:  ABC scale to be obtained    VITALS  There were no vitals filed for this visit.                                                                                                                              TREATMENT:  Ther Act  The following were performed for improved hip mobility, BLE strength, SLS stability, and quad strengthening: SLS on airex at ballet bar with intermittent UE support On RLE with hip half-circles with LLE 3 x 10 reps On LLE with hip half-circles with RLE x 5 reps Onset of L quad clonus/spasms making it difficult to maintain SLS on this limb Transitioned to stance on LLE performing R hip flexion, abduction, and extension 3 x 10 reps Pt with improved control of L quad but does have intermittent shaking of muscle and difficulty maintaining standing balance without locking out her knee Gait 3 x 115 ft with focus on increasing speed Pt does have onset again of L quad clonus/spasms during increased speed with increased distance covered  Gait pattern: L knee extension thrust and step through pattern Distance walked:  various clinic distances  Assistive device utilized: None Level of assistance: Modified independence Comments: Pt demonstrates L knee extension thrust if fatigued but no hyperextension noted   Elliptical level 3 x 4 min forwards x 4 min backwards (alternating every 2 min) for neural priming for reciprocal movement, dynamic  cardiovascular warmup and increased amplitude of stepping. RPE of 6/10 following activity.       PATIENT EDUCATION: Education details: continue HEP, work on transition from fast to slow walking and stop/go at home Person educated: Patient Education method: Medical illustrator Education comprehension: verbalized understanding and returned demonstration  HOME EXERCISE PROGRAM: Access Code: [email or web address] URL: [email or web address] Date: 05/02/2024 Prepared by: Marlon Plaster  Exercises - Half Kneeling Hip Flexor Stretch  - 1 x daily - 7 x weekly - 3 sets - 10 reps - Kneeling adductor stretch   - 1 x daily - 7 x weekly - 3 sets - 10 reps - Single leg bridge with leg lift   - 1 x daily - 7 x weekly - 1-2 sets - 7-10 reps - Modified Thomas Stretch  - 1 x daily - 7 x weekly - 2-3 sets - 1-2 minutes hold - Supine Straight Leg Lumbar Rotation Stretch  - 1 x daily - 7 x weekly - 3 sets - 10 reps - Psoas Mobilization with Small Ball  - 1 x daily - 7 x weekly - 3 sets - 2-3 minute  hold - Pigeon Pose  - 1 x daily - 7 x weekly - 1-2 sets - 1-2 minutes hold  Verbally added 03/27/24  - Pallof press rotations w/red theraband   GOALS: Goals reviewed with patient? Yes  SHORT TERM GOALS:   Target date: 04/05/2024  Pt will improve her score on the HiMat to >/= 26/54 to demonstrate improved balance and function Baseline: 11/54 (5/13), 21/54 (5/29), 13/54 (6/23) Goal status: NOT MET  2.  Pt will be able to perform 5 deep squats and return to standing to demonstrate improved function Baseline: 2 squats before unable to stand up without physical assist, 5 squats mod I (6/23) Goal status: MET  3.  Manual TUG to be assessed and LTG set Baseline: to be assessed Goal status: MET  4.  Cognitive TUG to be assessed and LTG set Baseline: to be assessed Goal status: MET    LONG TERM GOALS:  Target date: 04/26/2024  Pt will improve gait velocity to at least 3.75 ft/s for improved  gait efficiency and independence Baseline: 3.0 ft/sec (eval), 3.31 ft/sec (5/13), 3.57 ft/sec (5/29); 3.66 ft/s (7/9) Goal status: IN PROGRESS  2.  Pt will improve her score on the HiMat to >/=  25/54 to demonstrate improved balance and function Baseline: 11/54 (5/13), 21/54 (5/29), 13/54 (6/23); 26/54 (7/9) Goal status: MET  3.  Pt will be able to perform 10 deep squats and return to standing to demonstrate improved function Baseline: 2 squats before unable to stand up without physical assist Goal status: MET  4.  Pt will improve manual TUG to less than or equal to 12 seconds for improved functional mobility and decreased fall risk.  Baseline: 17.59s; 9.72s holding cup in LUE Goal status: MET  5.  Pt will improve cognitive TUG to less than or equal to 10 seconds for improved functional mobility and decreased fall risk.  Baseline: 12.35s counting backwards by 8; 7s retro counting by 8 Goal status: MET   NEW LONG TERM GOALS:  Target date: 05/24/2024  Pt will improve gait velocity to at least 3.75 ft/s for improved gait efficiency and independence Baseline: 3.0 ft/sec (eval), 3.31 ft/sec (5/13), 3.57 ft/sec (5/29); 3.66 ft/s (7/9) Goal status: IN PROGRESS  2.  Pt will hold single leg stance on LLE for >/= 20s for improved hip abductor strength, reduced fall risk and ankle proprioception  Baseline: 10.18s Goal status: INITIAL   ASSESSMENT:  CLINICAL IMPRESSION: Emphasis of skilled PT session on continuing to work on SLS stability and LLE NMR. Pt with difficulty maintaining stance on LLE due to ongoing functional weakness in her L quad as well as ongoing clonus in this limb. 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 She continues to benefit from skilled PT services to work on LLE NMR and return to PLOF. Continue POC.   OBJECTIVE IMPAIRMENTS: Abnormal gait,  decreased activity tolerance, decreased balance, decreased coordination, decreased endurance, decreased mobility, decreased strength, increased muscle spasms, impaired UE functional use, improper body mechanics, and pain  ACTIVITY LIMITATIONS: carrying, lifting, squatting, stairs, reach over head, hygiene/grooming, and locomotion level  PARTICIPATION LIMITATIONS: meal prep, cleaning, laundry, interpersonal relationship, driving, shopping, community activity, occupation, and yard work  PERSONAL FACTORS: 1 comorbidity: R internal capsule infarct/R basal ganglia stroke are also affecting patient's functional outcome.   REHAB POTENTIAL: Excellent  CLINICAL DECISION MAKING: Evolving/moderate complexity  EVALUATION COMPLEXITY: Moderate  PLAN:  PT FREQUENCY: 1-2x/week  PT DURATION: 6 weeks + 4 weeks (recert)   PLANNED INTERVENTIONS: 02835- PT Re-evaluation, 97750- Physical Performance Testing, 97110-Therapeutic exercises, 97530- Therapeutic activity, [id number]- Neuromuscular re-education, 97535- Self Care, [id number]- Manual therapy, [phone]- Gait training, [phone]- Canalith repositioning, [phone]- Aquatic Therapy, Patient/Family education, Balance training, Stair training, Taping, Dry Needling, Joint mobilization, Spinal mobilization, Vestibular training, and DME instructions  PLAN FOR NEXT SESSION: How was work? High level agility, elliptical, L quad eccentric strength, OH tasks. Practice w/plastic needles Marvetta has these) in oranges and pin the needle to targets? Cognitive dual tasking, needs to work on deep squats and return to standing from squats, agility drills, treadmill training with focus on increasing speed and potentially running? Half kneel, ladder drills, carrying weight with both hands (on rockerboard, stairs), agility ladder. Jumping. Wobble bar, standing hip half circles on airex, hip flexor strength w/knees extended   Pt goals: squats (for patient care), time based tasks, not lose my balance  (would lose balance with perturbations/reactive balance), multitasking/cog dual tasking (talking while doing tasks). Work simulation tasks (bed mobility, transfers, holding items)   Waddell Southgate, PT Waddell Southgate, PT, DPT, CSRS  05/08/2024, 9:29  AM

## 2024-05-08 NOTE — Progress Notes (Unsigned)
  Behavioral Health Counselor/Therapist Progress Note  Patient ID: Faith Jordan, MRN: [id number],    Date:  04/24/2024  Time Spent: 27 minutes  Time in:10:03  Time out: 10:29  Treatment Type:- The patient was seen via video visit.  She gave verbal consent for the session to be on video on caregility.  The patient was in her home alone and therapist was in the office.  Reported Symptoms: anxiety, panic attacks, sadness  Mental Status Exam: Appearance:  Casual     Behavior: Appropriate  Motor: Normal  Speech/Language:  Normal Rate  Affect: flat  Mood: Sad and tearful  Thought process: normal  Thought content:   WNL  Sensory/Perceptual disturbances:   WNL  Orientation: oriented to person, place, time/date, and situation  Attention: Good  Concentration: Good  Memory: WNL  Fund of knowledge:  Good  Insight:   Fair  Judgment:  Good  Impulse Control: Good   Risk Assessment: Danger to Self:  No Self-injurious Behavior: No Danger to Others: No Duty to Warn:no Physical Aggression / Violence:No  Access to Firearms a concern: No  Gang Involvement:No   Subjective: The patient was seen for an individual therapy session  via video visit today.  The patient gave verbal permission for the session to be on video on caregility and she is aware of the limitations of telehealth.  The patient was in her home alone and therapist was in the office.  The patient presents as sad and depressed today.  Patient is continuing to struggle with the fact that she has had a stroke and she is so young.  She did report that she went back to work last week and work 8 hours.  She is still thinking in a negative direction and her understand why she is struggling.  We talked about the need to help herself get to a place of acceptance that this is what she has to deal with.  She continues to say that she does not know why all these bad things are happening to her and we talked about bad things happening to  everyone and that we would need to try to get her to a place where she is feeling more confident in herself.  While we were talking her father sent the fire department over because her fire alarms were going off and they needed to come and check them.  We had to end the session early and we have something scheduled in a few weeks.   interventions: Cognitive Behavioral Therapy and Assertiveness/Communication,, problem solving, psychoeducation,  EMDR as indicated, Meditation and mindfulness,   Diagnosis:Major depressive disorder, recurrent episode, moderate (HCC)  GAD (generalized anxiety disorder)  Obsessive-compulsive disorder, unspecified type  Plan: Client Abilities/Strengths  Intelligent, insightful, motivated  Client Treatment Preferences  Outpatient Individual therapy every other week  Client Statement of Needs   I feel better now, but still need some help with anxiety  Treatment Level  Outpatient Individual therapy  Symptoms  Frustration and anxiety related to providing oversight and caretaking to an aging, ailing, and dependent  parent.: No Description Entered (Status: improved). Hypervigilance (e.g., feeling constantly on edge,  experiencing concentration difficulties, having trouble falling or staying asleep, exhibiting a general  state of irritability).: No Description Entered (Status: improved). Motor tension (e.g., restlessness,  tiredness, shakiness, muscle tension).: No Description Entered (Status: improved).  Problems Addressed  Anxiety, Phase Of Life Problems, Anxiety  Goals 1. Learn and implement coping skills that result in a reduction of anxiety  and worry, and improved daily functioning. Objective Learn and implement calming skills to reduce overall anxiety and manage anxiety symptoms. Target Date: 2026-08-09Frequency: Weekly Progress: 40 Modality: individual  Related Interventions 1. Teach the client calming/relaxation skills (e.g., applied relaxation,  progressive muscle  relaxation, cue controlled relaxation; mindful breathing; biofeedback) and how to discriminate  better between relaxation and tension; teach the client how to apply these skills to his/her daily  life (e.g., New Directions in Progressive Muscle Relaxation by Thornell Collier, and  Hazlett-Stevens; Treating Generalized Anxiety Disorder by Rygh and Red). Objective Identify, challenge, and replace biased, fearful self-talk with positive, realistic, and empowering selftalk. Target Date: 2025-05-26 Frequency: weekly Progress: 30 Modality: individual Related Interventions 1. Explore the client's schema and self-talk that mediate his/her fear response; assist him/her in  challenging the biases; replace the distorted messages with reality-based alternatives and  positive, realistic self-talk that will increase his/her self-confidence in coping with irrational  fears (see Cognitive Therapy of Anxiety Disorders by Gretta armin Mon). Objective Learn and implement problem-solving strategies for realistically addressing worries. Target Date: 2026-08-09Frequency: weekly Progress: 40 Modality: individual 2. Resolve conflicted feelings and adapt to the new life circumstances. Objective Apply problem-solving skills to current circumstances. Target Date: 2025-05-26 Frequency: weekly Progress: 20 Modality: individual Related Interventions 1. Teach the client problem-resolution skills (e.g., defining the problem clearly, brainstorming  multiple solutions, listing the pros and cons of each solution, seeking input from others,  selecting and implementing a plan of action, evaluating outcome, and readjusting plan as  necessary).   3. Stabilize anxiety level while increasing ability to function on a daily  basis. Diagnosis F33.1  Major depressive disorder, moderate 300.02 (Generalized anxiety disorder) - Open - [Signifier: n/a]  Axis  none 309.28 (Adjustment disorder with  mixed anxiety and depressed  mood) - Open - [Signifier: n/a]  Adjustment Disorder,  With Anxiety   Marital conflict  Major Depressive disorder, moderate  Conditions For Discharge Achievement of treatment goals and objectives.  The patient approved this plan.   Peggye KANDICE Macintosh, LCSW             [facility] Counselor/Therapist Progress Note  Patient ID: Faith Jordan, MRN: [id number],    Date:  04/12/2024  Time Spent: 52 minutes  Time in:9:04  Time out: 9:56  Treatment Type:- The patient was seen via video visit.  She gave verbal consent for the session to be on video on caregility.  The patient was in her home alone and therapist was in the office.  Reported Symptoms: anxiety, panic attacks  Mental Status Exam: Appearance:  Casual     Behavior: Appropriate  Motor: Normal  Speech/Language:  Normal Rate  Affect: flat  Mood: Sad and tearful  Thought process: normal  Thought content:   WNL  Sensory/Perceptual disturbances:   WNL  Orientation: oriented to person, place, time/date, and situation  Attention: Good  Concentration: Good  Memory: WNL  Fund of knowledge:  Good  Insight:   Fair  Judgment:  Good  Impulse Control: Good   Risk Assessment: Danger to Self:  No Self-injurious Behavior: No Danger to Others: No Duty to Warn:no Physical Aggression / Violence:No  Access to Firearms a concern: No  Gang Involvement:No   Subjective: The patient was seen for an individual therapy session  via video visit today.  The patient gave verbal permission for the session to be on video on caregility and she is aware of the limitations of telehealth.  The patient was in  her home alone and therapist was in the office.  The patient presents as sad and depressed today.  The patient had a hard time talking in the beginning.  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  She also is being to refer to a cardiologist at [facility].  The patient was supposed to go back to work on July 10 but with this new development as possible that she may not be able to return right now.  She was very tearful throughout the whole session and I do believe that she is depressed.  We will continue to monitor the situation to see if we need to interact with her medication provider and encourage increase in antidepressant medication as she is struggling with the news.  We also talked about her not catastrophizing and trying to stay in the present moment.  She is worried that she will not have a relationship or be able to have children at any point in her life.  Provided supportive therapy and did some reframing.  interventions: Cognitive Behavioral Therapy and Assertiveness/Communication,, problem solving, psychoeducation,  EMDR as indicated, Meditation and mindfulness,   Diagnosis:Major depressive disorder, recurrent episode, moderate (HCC)  GAD (generalized anxiety disorder)  Obsessive-compulsive disorder, unspecified type  Plan: Client Abilities/Strengths  Intelligent, insightful, motivated  Client Treatment Preferences  Outpatient Individual therapy every other week  Client Statement of Needs   I feel better now, but still need some help with anxiety  Treatment Level  Outpatient Individual therapy  Symptoms  Frustration and anxiety related to providing oversight and caretaking to an aging, ailing, and dependent  parent.: No Description Entered (Status: improved). Hypervigilance (e.g., feeling constantly on edge,  experiencing concentration difficulties, having trouble falling or staying asleep, exhibiting a general  state of irritability).: No Description Entered (Status: improved). Motor tension (e.g., restlessness,  tiredness, shakiness, muscle  tension).: No Description Entered (Status: improved).  Problems Addressed  Anxiety, Phase Of Life Problems, Anxiety  Goals 1. Learn and implement coping skills that result in a reduction of anxiety  and worry, and improved daily functioning. Objective Learn and implement calming skills to reduce overall anxiety and manage anxiety symptoms. Target Date: 2025-08-09Frequency: Weekly Progress: 40 Modality: individual  Related Interventions 1. Teach the client calming/relaxation skills (e.g., applied relaxation, progressive muscle  relaxation, cue controlled relaxation; mindful breathing; biofeedback) and how to discriminate  better between relaxation and tension; teach the client how to apply these skills to his/her daily  life (e.g., New Directions in Progressive Muscle Relaxation by Thornell Collier, and  Hazlett-Stevens; Treating Generalized Anxiety Disorder by Rygh and Red). Objective Identify, challenge, and replace biased, fearful self-talk with positive, realistic, and empowering selftalk. Target Date: 2024-05-26 Frequency: weekly Progress: 30 Modality: individual Related Interventions 1. Explore the client's schema and self-talk that mediate his/her fear response; assist him/her in  challenging the biases; replace the distorted messages with reality-based alternatives and  positive, realistic self-talk that will increase his/her self-confidence in coping with irrational  fears (see Cognitive Therapy of Anxiety Disorders by Gretta armin Mon). Objective Learn and implement problem-solving strategies for realistically addressing worries. Target Date: 2025-08-09Frequency: weekly Progress: 40 Modality: individual 2. Resolve conflicted feelings and adapt to the new life circumstances. Objective Apply problem-solving skills to  current circumstances. Target Date: 2024-05-26 Frequency: weekly Progress: 20 Modality: individual Related Interventions 1. Teach the client  problem-resolution skills (e.g., defining the problem clearly, brainstorming  multiple solutions, listing the pros and cons of each solution, seeking input from others,  selecting and implementing a plan of action, evaluating outcome, and readjusting plan as  necessary).   3. Stabilize anxiety level while increasing ability to function on a daily  basis. Diagnosis F33.1  Major depressive disorder, moderate 300.02 (Generalized anxiety disorder) - Open - [Signifier: n/a]  Axis  none 309.28 (Adjustment disorder with mixed anxiety and depressed  mood) - Open - [Signifier: n/a]  Adjustment Disorder,  With Anxiety   Marital conflict  Major Depressive disorder, moderate  Conditions For Discharge Achievement of treatment goals and objectives.  The patient approved this plan.   Nyiah Pianka G Farhad Burleson, LCSW

## 2024-05-10 ENCOUNTER — Encounter: Payer: Self-pay | Admitting: Physical Medicine & Rehabilitation

## 2024-05-10 ENCOUNTER — Ambulatory Visit: Admitting: Primary Care

## 2024-05-15 ENCOUNTER — Ambulatory Visit (INDEPENDENT_AMBULATORY_CARE_PROVIDER_SITE_OTHER): Admitting: Psychology

## 2024-05-15 ENCOUNTER — Encounter: Payer: Self-pay | Admitting: Speech Pathology

## 2024-05-15 ENCOUNTER — Telehealth: Payer: Self-pay | Admitting: Pharmacist

## 2024-05-15 DIAGNOSIS — F429 Obsessive-compulsive disorder, unspecified: Secondary | ICD-10-CM

## 2024-05-15 DIAGNOSIS — F411 Generalized anxiety disorder: Secondary | ICD-10-CM

## 2024-05-15 DIAGNOSIS — Z6823 Body mass index (BMI) 23.0-23.9, adult: Secondary | ICD-10-CM | POA: Diagnosis not present

## 2024-05-15 DIAGNOSIS — F331 Major depressive disorder, recurrent, moderate: Secondary | ICD-10-CM

## 2024-05-15 DIAGNOSIS — Z01419 Encounter for gynecological examination (general) (routine) without abnormal findings: Secondary | ICD-10-CM | POA: Diagnosis not present

## 2024-05-15 NOTE — Progress Notes (Unsigned)
                edge,  experiencing concentration difficulties, having trouble falling or staying asleep, exhibiting a general  state of irritability).: No Description Entered (Status: improved). Motor tension (e.g., restlessness,  tiredness, shakiness, muscle tension).: No Description Entered (Status: improved).  Problems Addressed  Anxiety, Phase Of Life Problems, Anxiety  Goals 1. Learn and implement coping skills that result in a reduction of anxiety  and worry, and improved daily functioning. Objective Learn  and implement calming skills to reduce overall anxiety and manage anxiety symptoms. Target Date: 2025-08-09Frequency: Weekly Progress: 40 Modality: individual  Related Interventions 1. Teach the client calming/relaxation skills (e.g., applied relaxation, progressive muscle  relaxation, cue controlled relaxation; mindful breathing; biofeedback) and how to discriminate  better between relaxation and tension; teach the client how to apply these skills to his/her daily  life (e.g., New Directions in Progressive Muscle Relaxation by Marcelyn Ditty, and  Hazlett-Stevens; Treating Generalized Anxiety Disorder by Rygh and Ida Rogue). Objective Identify, challenge, and replace biased, fearful self-talk with positive, realistic, and empowering selftalk. Target Date: 2024-05-26 Frequency: weekly Progress: 30 Modality: individual Related Interventions 1. Explore the client's schema and self-talk that mediate his/her fear response; assist him/her in  challenging the biases; replace the distorted messages with reality-based alternatives and  positive, realistic self-talk that will increase his/her self-confidence in coping with irrational  fears (see Cognitive Therapy of Anxiety Disorders by Laurence Slate). Objective Learn and implement problem-solving strategies for realistically addressing worries. Target Date: 2025-08-09Frequency: weekly Progress: 40 Modality: individual 2. Resolve conflicted feelings and adapt to the new life circumstances. Objective Apply problem-solving skills to current circumstances. Target Date: 2024-05-26 Frequency: weekly Progress: 20 Modality: individual Related Interventions 1. Teach the client problem-resolution skills (e.g., defining the problem clearly, brainstorming  multiple solutions, listing the pros and cons of each solution, seeking input from others,  selecting and implementing a plan of action, evaluating outcome, and readjusting plan as   necessary).   3. Stabilize anxiety level while increasing ability to function on a daily  basis. Diagnosis F33.1  Major depressive disorder, moderate 300.02 (Generalized anxiety disorder) - Open - [Signifier: n/a]  Axis  none 309.28 (Adjustment disorder with mixed anxiety and depressed  mood) - Open - [Signifier: n/a]  Adjustment Disorder,  With Anxiety   Marital conflict  Major Depressive disorder, moderate  Conditions For Discharge Achievement of treatment goals and objectives.  The patient approved this plan.   Deonna Krummel G Ethridge Sollenberger, LCSW

## 2024-05-15 NOTE — Telephone Encounter (Signed)
 Pharmacy Patient Advocate Encounter  Received notification from [facility] that Prior Authorization for Nurtec 75MG  Tablets has been APPROVED from 05/15/2024 to 05/15/2025   PA #/Case ID/Reference #: [id number]

## 2024-05-16 ENCOUNTER — Telehealth: Payer: Self-pay | Admitting: Physical Medicine & Rehabilitation

## 2024-05-16 ENCOUNTER — Ambulatory Visit: Admitting: Neurology

## 2024-05-16 ENCOUNTER — Ambulatory Visit: Payer: Self-pay | Admitting: Physical Therapy

## 2024-05-16 VITALS — BP 105/80 | HR 88

## 2024-05-16 DIAGNOSIS — M6281 Muscle weakness (generalized): Secondary | ICD-10-CM | POA: Diagnosis not present

## 2024-05-16 DIAGNOSIS — R278 Other lack of coordination: Secondary | ICD-10-CM | POA: Diagnosis not present

## 2024-05-16 DIAGNOSIS — R2681 Unsteadiness on feet: Secondary | ICD-10-CM

## 2024-05-16 DIAGNOSIS — I69354 Hemiplegia and hemiparesis following cerebral infarction affecting left non-dominant side: Secondary | ICD-10-CM | POA: Diagnosis not present

## 2024-05-16 DIAGNOSIS — R2689 Other abnormalities of gait and mobility: Secondary | ICD-10-CM | POA: Diagnosis not present

## 2024-05-16 DIAGNOSIS — R29898 Other symptoms and signs involving the musculoskeletal system: Secondary | ICD-10-CM | POA: Diagnosis not present

## 2024-05-16 DIAGNOSIS — I6381 Other cerebral infarction due to occlusion or stenosis of small artery: Secondary | ICD-10-CM | POA: Diagnosis not present

## 2024-05-16 DIAGNOSIS — R29818 Other symptoms and signs involving the nervous system: Secondary | ICD-10-CM

## 2024-05-16 NOTE — Telephone Encounter (Signed)
 Patient states she need a written letter that  she is able to increase her hours.

## 2024-05-16 NOTE — Therapy (Signed)
 OUTPATIENT PHYSICAL THERAPY NEURO TREATMENT    Patient Name: Faith Jordan MRN: [id number] DOB:[birth year], 28 y.o., female Today's Date: 05/16/2024   PCP: Gretta Comer POUR, NP REFERRING PROVIDER: Carilyn Prentice BRAVO, MD   END OF SESSION:  PT End of Session - 05/16/24 1446     Visit Number 27    Number of Visits 32   recert   Date for PT Re-Evaluation 05/30/24   recert   Authorization Type [employer]    PT Start Time 1445    PT Stop Time 1524    PT Time Calculation (min) 39 min    Equipment Utilized During Treatment Gait belt    Activity Tolerance Patient tolerated treatment well    Behavior During Therapy [facility] for tasks assessed/performed                   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   Acute flank pain 06/02/2022   Angio-edema 04/27/2016   Angioedema    Aquagenic angio-edema-urticaria    Asthma    no problems /not used recently   Dysautonomia (HCC)    Dysrhythmia    sinus tachycardia   Fibroid    right breast, adenoma   Headache(784.0)    History of COVID-19 11/21/2020   Hives 12/22/2015   Pneumonia    hx  6th grade   Sensation of fullness in both ears 02/02/2023   Snoring 02/11/2022   Urinary frequency 02/12/2019   Vaginal yeast infection 06/02/2022   Vasculitis (HCC)    Past Surgical History:  Procedure Laterality Date   ADENOIDECTOMY     BREAST LUMPECTOMY Right    BREAST LUMPECTOMY Right    MASS EXCISION Right 10/07/2014   Procedure: EXCISION OF RIGHT BREAST MASS;  Surgeon: Krystal Russell, MD;  Location[facility] OR;  Service: General;  Laterality: Right;   TONSILLECTOMY AND ADENOIDECTOMY     TOOTH EXTRACTION     TRANSESOPHAGEAL ECHOCARDIOGRAM (CATH LAB) N/A 01/10/2024   Procedure: TRANSESOPHAGEAL ECHOCARDIOGRAM;  Surgeon: Jeffrie Oneil BROCKS, MD;  Location: [facility] INVASIVE CV LAB;  Service: Cardiovascular;  Laterality: N/A;   Patient Active Problem List   Diagnosis Date Noted    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) 04/09/2024   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 (CVA) 04/05/2024   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) 02/10/2024   Coping style affecting medical condition 01/19/2024   History of CVA (cerebrovascular accident) 01/12/2024   Stroke of right basal ganglia (HCC) 01/07/2024   Non-restorative sleep 05/26/2022   Sleep paralysis, 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   Vivid dream 05/26/2022   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   Excessive daytime sleepiness 05/26/2022   Sleep disturbance 03/17/2022   Panic attacks 02/11/2022   Vitamin B12 deficiency 07/30/2021   Vitamin D  deficiency 07/30/2021   Family history of hypothyroidism 07/30/2021   GAD (generalized anxiety disorder) 03/18/2020   Fatigue 11/06/2019   Chronic back pain 07/06/2019   Preventative health care 04/27/2018   Migraines 04/25/2018   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   Anemia 01/04/2018   Headache disorder 12/29/2015   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   Sinus tachycardia 12/22/2015   Dysautonomia (HCC) 12/22/2015   Dysautonomia, familial (HCC) 04/22/2015   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   Neurocardiogenic syncope 04/06/2013   Intermittent palpitations 04/06/2013    ONSET DATE: 01/26/2024 (referral)   REFERRING DIAG:  I63.81 (ICD-10-CM) - Other cerebral infarction due to occlusion or stenosis of small artery    THERAPY DIAG:  Muscle weakness (generalized)  Unsteadiness on feet  Other lack of coordination  Other symptoms and signs involving the nervous system  Other symptoms and signs involving the musculoskeletal system  Other abnormalities of gait and mobility  Rationale for Evaluation and Treatment: Rehabilitation  SUBJECTIVE:                                                                                                                                                                                              SUBJECTIVE STATEMENT:  Pt reports that things have been okay since last visit, pt feels like the nausea and trouble eating have gotten worse. Original plan was to discuss with her neurologist and Dr. MARLA, did make an appointment with her PCP for 8/5 to discuss.  Pt has been doing fine with work, doesn't go back until Friday this week. She did ask Dr. MARLA about increasing to 3 days/week, did not hear back but he did respond in chart. She is going to reach out to their office to get necessary paperwork to increase her schedule.  She also reports that she has been feeling a little more off balance. Pt reports no falls but was in a deep squat baby-sitting her cousin and fell on her butt, was able to get back up independently. She also feels like she has been running into the wall on her L side more over the past week and overall feels weaker. She does feel like she has decreased energy with the ongoing nausea leading to decreased appetite. She said it feels like she has a stomach bug all the time, nausea but not throwing up.  Pt has not been working out or exercising as she really has not felt like it. She has not been doing her walks because it has been so hot.  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   Pt accompanied by: self  PERTINENT HISTORY: familiar hemiplegic migraine, angioedema 2017,dysautonomia; infarct right internal capsule/ R basal ganglia stroke  PAIN:  Are you having pain? No    PRECAUTIONS: Fall  RED FLAGS: None   WEIGHT BEARING RESTRICTIONS: No  FALLS: Has patient fallen in last 6 months? No  LIVING ENVIRONMENT: Lives with: lives with their family Lives in: House/apartment Stairs: Yes: External: 4 steps; bilateral but cannot reach both Has following equipment at home: shower chair  PLOF: Independent  PATIENT GOALS:  Being normal again. Being able to go back to work and be  independent   OBJECTIVE:  Note: Objective measures were completed at Evaluation unless otherwise noted.  DIAGNOSTIC FINDINGS: MRI of brain on 01/07/24  IMPRESSION: 1. 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 2. Susceptibility related calcifications in the globus pallidus bilaterally. This likely reflects the sequela of chronic microvascular ischemia.  COGNITION: Overall cognitive status: Within functional limits for tasks assessed   SENSATION: Pt denies numbness/tingling in L hemibody   MUSCLE TONE: Non-fatiguing clonus of LLE  POSTURE: No Significant postural limitations  LOWER EXTREMITY ROM:     Active  Right Eval Left Eval  Hip flexion    Hip extension    Hip abduction    Hip adduction    Hip internal rotation    Hip external rotation    Knee flexion    Knee extension    Ankle dorsiflexion    Ankle plantarflexion    Ankle inversion    Ankle eversion     (Blank rows = not tested)  LOWER EXTREMITY MMT:  Tested in seated position   MMT Right Eval Left Eval  Hip flexion 5 4+  Hip extension    Hip abduction 5 5  Hip adduction 5 5  Hip internal rotation    Hip external rotation    Knee flexion 5 5  Knee extension 5 4+  Ankle dorsiflexion 5 5  Ankle plantarflexion    Ankle inversion    Ankle eversion    (Blank rows = not tested)  BED MOBILITY:  Not tested  TRANSFERS: Sit to stand: Complete Independence  Assistive device utilized: None     Stand to sit: Complete Independence  Assistive device utilized: None      RAMP:  Not tested  CURB:  Not tested  GAIT: Gait pattern: step through pattern, decreased arm swing- Left, decreased step length- Right, decreased stance time- Left, decreased stride length, decreased hip/knee flexion- Left, lateral hip instability, and wide BOS Distance walked: Various clinic distances  Assistive device utilized:  None Level of assistance: Modified independence Comments: Pt walks very guarded, as if she feels unstable. No instability noted other than single instance of L knee buckling which pt able to self-correct.    PATIENT SURVEYS:  ABC scale to be obtained    VITALS  Vitals:   05/16/24 1454  BP: 105/80  Pulse: 88                                                                                                                                TREATMENT:   Self-Care/Home Management Assessed vitals in LUE in sitting at rest, BP slightly low but this is consistent with previous readings Provided emotional support as pt does become tearful discussing Fahr's disease diagnosis as well as stroke recovery. Discussed potential for d/c from OPPT next visit, will reassess goals and discuss with primary PT to come up with a plan next session. Educated patient that  just because she is d/c from PT does not mean that she cannot return in the future but if she is in a stable place and able to continue to progress independently at home she won't require formal PT. Pt is hesitant about idea of d/c due to ongoing shaking in her L quad at times.  Ther Act  Gait 2 x 230 ft (L and R around blue track) to observe for any gait deviations. Pt not noted to have any path deviation or significant gait deviations this session. To work on balance reactions and stepping strategy: Resisted gait with purple band Steady resistance with progression to multidirectional perturbations Progression from cross over stepping to stepping out strategy To work on core stabilization: Plank x 6 sec, onset of LBP Modified plank, very difficult on LUE Quadruped bird dogs 2 x 5 reps Hard but pt able to perform these     PATIENT EDUCATION: Education details: continue HEP, see above regarding d/c plan Person educated: Patient Education method: Medical illustrator Education comprehension: verbalized understanding and returned  demonstration  HOME EXERCISE PROGRAM: Access Code: [email or web address] URL: [email or web address] Date: 05/02/2024 Prepared by: Marlon Plaster  Exercises - Half Kneeling Hip Flexor Stretch  - 1 x daily - 7 x weekly - 3 sets - 10 reps - Kneeling adductor stretch   - 1 x daily - 7 x weekly - 3 sets - 10 reps - Single leg bridge with leg lift   - 1 x daily - 7 x weekly - 1-2 sets - 7-10 reps - Modified Thomas Stretch  - 1 x daily - 7 x weekly - 2-3 sets - 1-2 minutes hold - Supine Straight Leg Lumbar Rotation Stretch  - 1 x daily - 7 x weekly - 3 sets - 10 reps - Psoas Mobilization with Small Ball  - 1 x daily - 7 x weekly - 3 sets - 2-3 minute  hold - Pigeon Pose  - 1 x daily - 7 x weekly - 1-2 sets - 1-2 minutes hold  Verbally added 03/27/24  - Pallof press rotations w/red theraband   GOALS: Goals reviewed with patient? Yes  SHORT TERM GOALS:   Target date: 04/05/2024  Pt will improve her score on the HiMat to >/= 26/54 to demonstrate improved balance and function Baseline: 11/54 (5/13), 21/54 (5/29), 13/54 (6/23) Goal status: NOT MET  2.  Pt will be able to perform 5 deep squats and return to standing to demonstrate improved function Baseline: 2 squats before unable to stand up without physical assist, 5 squats mod I (6/23) Goal status: MET  3.  Manual TUG to be assessed and LTG set Baseline: to be assessed Goal status: MET  4.  Cognitive TUG to be assessed and LTG set Baseline: to be assessed Goal status: MET    LONG TERM GOALS:  Target date: 04/26/2024  Pt will improve gait velocity to at least 3.75 ft/s for improved gait efficiency and independence Baseline: 3.0 ft/sec (eval), 3.31 ft/sec (5/13), 3.57 ft/sec (5/29); 3.66 ft/s (7/9) Goal status: IN PROGRESS  2.  Pt will improve her score on the HiMat to >/= 25/54 to demonstrate improved balance and function Baseline: 11/54 (5/13), 21/54 (5/29), 13/54 (6/23); 26/54 (7/9) Goal status: MET  3.  Pt will be able to  perform 10 deep squats and return to standing to demonstrate improved function Baseline: 2 squats before unable to stand up without physical assist Goal status: MET  4.  Pt will improve manual TUG to  less than or equal to 12 seconds for improved functional mobility and decreased fall risk.  Baseline: 17.59s; 9.72s holding cup in LUE Goal status: MET  5.  Pt will improve cognitive TUG to less than or equal to 10 seconds for improved functional mobility and decreased fall risk.  Baseline: 12.35s counting backwards by 8; 7s retro counting by 8 Goal status: MET   NEW LONG TERM GOALS:  Target date: 05/24/2024  Pt will improve gait velocity to at least 3.75 ft/s for improved gait efficiency and independence Baseline: 3.0 ft/sec (eval), 3.31 ft/sec (5/13), 3.57 ft/sec (5/29); 3.66 ft/s (7/9) Goal status: IN PROGRESS  2.  Pt will hold single leg stance on LLE for >/= 20s for improved hip abductor strength, reduced fall risk and ankle proprioception  Baseline: 10.18s Goal status: INITIAL   ASSESSMENT:  CLINICAL IMPRESSION: Emphasis of skilled PT session on continuing to work on core strengthening/stabilization, working on dynamic balance and balance reactions, and discussing PT POC with potential for d/c next visit. Pt does exhibit ongoing core and L hemibody weakness as well as difficulty with higher level balance challenges. Plan to reassess LTG next visit and potentially d/c following discussion with primary PT. Continue POC.   OBJECTIVE IMPAIRMENTS: Abnormal gait, decreased activity tolerance, decreased balance, decreased coordination, decreased endurance, decreased mobility, decreased strength, increased muscle spasms, impaired UE functional use, improper body mechanics, and pain  ACTIVITY LIMITATIONS: carrying, lifting, squatting, stairs, reach over head, hygiene/grooming, and locomotion level  PARTICIPATION LIMITATIONS: meal prep, cleaning, laundry, interpersonal relationship,  driving, shopping, community activity, occupation, and yard work  PERSONAL FACTORS: 1 comorbidity: R internal capsule infarct/R basal ganglia stroke are also affecting patient's functional outcome.   REHAB POTENTIAL: Excellent  CLINICAL DECISION MAKING: Evolving/moderate complexity  EVALUATION COMPLEXITY: Moderate  PLAN:  PT FREQUENCY: 1-2x/week  PT DURATION: 6 weeks + 4 weeks (recert)   PLANNED INTERVENTIONS: 02835- PT Re-evaluation, 97750- Physical Performance Testing, 97110-Therapeutic exercises, 97530- Therapeutic activity, [id number]- Neuromuscular re-education, 97535- Self Care, [id number]- Manual therapy, [phone]- Gait training, [phone]- Canalith repositioning, [phone]- Aquatic Therapy, Patient/Family education, Balance training, Stair training, Taping, Dry Needling, Joint mobilization, Spinal mobilization, Vestibular training, and DME instructions  PLAN FOR NEXT SESSION: d/c next visit-discuss POC on d/c vs add visits?  How was work? High level agility, elliptical, L quad eccentric strength, OH tasks. Practice w/plastic needles Marvetta has these) in oranges and pin the needle to targets? Cognitive dual tasking, needs to work on deep squats and return to standing from squats, agility drills, treadmill training with focus on increasing speed and potentially running? Half kneel, ladder drills, carrying weight with both hands (on rockerboard, stairs), agility ladder. Jumping. Wobble bar, standing hip half circles on airex, hip flexor strength w/knees extended   Pt goals: squats (for patient care), time based tasks, not lose my balance (would lose balance with perturbations/reactive balance), multitasking/cog dual tasking (talking while doing tasks). Work simulation tasks (bed mobility, transfers, holding items)  Waddell Southgate, PT Waddell Southgate, PT, DPT, CSRS  05/16/2024, 3:24 PM

## 2024-05-17 ENCOUNTER — Telehealth: Payer: Self-pay | Admitting: Physical Medicine & Rehabilitation

## 2024-05-17 ENCOUNTER — Encounter: Payer: Self-pay | Admitting: Physical Medicine & Rehabilitation

## 2024-05-17 NOTE — Telephone Encounter (Signed)
 LVM for patient that Dr. Carilyn has filled out letter, for reduced hours at work.  I have put the letter in file folder up front (blue).

## 2024-05-22 ENCOUNTER — Ambulatory Visit (INDEPENDENT_AMBULATORY_CARE_PROVIDER_SITE_OTHER): Admitting: Psychology

## 2024-05-22 ENCOUNTER — Ambulatory Visit: Admitting: Primary Care

## 2024-05-22 DIAGNOSIS — F411 Generalized anxiety disorder: Secondary | ICD-10-CM | POA: Diagnosis not present

## 2024-05-22 DIAGNOSIS — F331 Major depressive disorder, recurrent, moderate: Secondary | ICD-10-CM

## 2024-05-22 DIAGNOSIS — F429 Obsessive-compulsive disorder, unspecified: Secondary | ICD-10-CM

## 2024-05-22 NOTE — Progress Notes (Signed)
 [facility] Counselor/Therapist Progress Note  Patient ID: Faith Jordan, MRN: [id number],    Date:  05/22/2024  Time Spent: 57 minutes  Time in:10:00  Time out: 10:57  Treatment Type:- The patient was seen via video visit.  She gave verbal consent for the session to be on video on caregility.  The patient was in her home alone and therapist was in the office.  Reported Symptoms: anxiety, panic attacks, sadness  Mental Status Exam: Appearance:  Casual     Behavior: Appropriate  Motor: Normal  Speech/Language:  Normal Rate  Affect: flat  Mood: Sad and tearful  Thought process: normal  Thought content:   WNL  Sensory/Perceptual disturbances:   WNL  Orientation: oriented to person, place, time/date, and situation  Attention: Good  Concentration: Good  Memory: WNL  Fund of knowledge:  Good  Insight:   Fair  Judgment:  Good  Impulse Control: Good   Risk Assessment: Danger to Self:  No Self-injurious Behavior: No Danger to Others: No Duty to Warn:no Physical Aggression / Violence:No  Access to Firearms a concern: No  Gang Involvement:No   Subjective: The patient was seen for an individual therapy session  via video visit today.  The patient gave verbal permission for the session to be on video on caregility and she is aware of the limitations of telehealth.  The patient was in her home alone and therapist was in the office.  The patient presents with a blunted affect and her mood is pleasant.  She does appear a little sad today.  The patient reports that she is back to work for 3 to 4-hour shifts.  She states that she feels okay about this.  She does not seem to be as depressed today as she has been.  She reports that yesterday her divorce was final and she was granted the divorce from her husband.  She says that her whole family went out to [place] food and she felt good about that.  The patient has gotten her hair colored and she feels like that was a good thing for  her to do for herself.  She reports that she has been researching Fahr's disease and she can talk about it now without crying all the time.  She did state that she is using her Ativan  more and I recommended that she speak with her provider tomorrow when she has an appointment about that.  She is going to a neurologist next week and is having an MRI tomorrow.  She also is supposed to go to the neuro degenerative doctor in September.  We talked about her continuing to do things that are good for her and she talked about going to get herself a new book to read and it seems that she is going to get her nails done with a friend of hers today.  We talked briefly about the possibility of her getting back into the dating scene and she is not quite ready to do that yet but I explained to her that if she does not do something she will not have as many opportunities to meet someone.    Interventions: Cognitive Behavioral Therapy and Assertiveness/Communication,, problem solving, psychoeducation,  EMDR as indicated, Meditation and mindfulness,   Diagnosis:Major depressive disorder, recurrent episode, moderate (HCC)  GAD (generalized anxiety disorder)  Obsessive-compulsive disorder, unspecified type  Plan: Client Abilities/Strengths  Intelligent, insightful, motivated  Client Treatment Preferences  Outpatient Individual therapy every other week  Client Statement of Needs  I feel better now, but still need some help with anxiety  Treatment Level  Outpatient Individual therapy  Symptoms  Frustration and anxiety related to providing oversight and caretaking to an aging, ailing, and dependent  parent.: No Description Entered (Status: improved). Hypervigilance (e.g., feeling constantly on edge,  experiencing concentration difficulties, having trouble falling or staying asleep, exhibiting a general  state of irritability).: No Description Entered (Status: improved). Motor tension (e.g., restlessness,   tiredness, shakiness, muscle tension).: No Description Entered (Status: improved).  Problems Addressed  Anxiety, Phase Of Life Problems, Anxiety  Goals 1. Learn and implement coping skills that result in a reduction of anxiety  and worry, and improved daily functioning. Objective Learn and implement calming skills to reduce overall anxiety and manage anxiety symptoms. Target Date: 2026-08-09Frequency: Weekly Progress: 40 Modality: individual  Related Interventions 1. Teach the client calming/relaxation skills (e.g., applied relaxation, progressive muscle  relaxation, cue controlled relaxation; mindful breathing; biofeedback) and how to discriminate  better between relaxation and tension; teach the client how to apply these skills to his/her daily  life (e.g., New Directions in Progressive Muscle Relaxation by Thornell Collier, and  Hazlett-Stevens; Treating Generalized Anxiety Disorder by Rygh and Red). Objective Identify, challenge, and replace biased, fearful self-talk with positive, realistic, and empowering selftalk. Target Date: 2025-05-26 Frequency: weekly Progress: 30 Modality: individual Related Interventions 1. Explore the client's schema and self-talk that mediate his/her fear response; assist him/her in  challenging the biases; replace the distorted messages with reality-based alternatives and  positive, realistic self-talk that will increase his/her self-confidence in coping with irrational  fears (see Cognitive Therapy of Anxiety Disorders by Gretta armin Mon). Objective Learn and implement problem-solving strategies for realistically addressing worries. Target Date: 2026-08-09Frequency: weekly Progress: 40 Modality: individual 2. Resolve conflicted feelings and adapt to the new life circumstances. Objective Apply problem-solving skills to current circumstances. Target Date: 2025-05-26 Frequency: weekly Progress: 20 Modality: individual Related  Interventions 1. Teach the client problem-resolution skills (e.g., defining the problem clearly, brainstorming  multiple solutions, listing the pros and cons of each solution, seeking input from others,  selecting and implementing a plan of action, evaluating outcome, and readjusting plan as  necessary).   3. Stabilize anxiety level while increasing ability to function on a daily  basis. Diagnosis F33.1  Major depressive disorder, moderate 300.02 (Generalized anxiety disorder) - Open - [Signifier: n/a]  Axis  none 309.28 (Adjustment disorder with mixed anxiety and depressed  mood) - Open - [Signifier: n/a]  Adjustment Disorder,  With Anxiety   Marital conflict  Major Depressive disorder, moderate  Conditions For Discharge Achievement of treatment goals and objectives.  The patient approved this plan.   Roya Gieselman G Alyona Romack, LCSW

## 2024-05-23 ENCOUNTER — Encounter: Payer: Self-pay | Admitting: Adult Health

## 2024-05-23 ENCOUNTER — Other Ambulatory Visit: Payer: Self-pay | Admitting: Primary Care

## 2024-05-23 ENCOUNTER — Telehealth: Admitting: Adult Health

## 2024-05-23 ENCOUNTER — Other Ambulatory Visit (HOSPITAL_COMMUNITY): Payer: Self-pay

## 2024-05-23 ENCOUNTER — Other Ambulatory Visit: Payer: Self-pay

## 2024-05-23 DIAGNOSIS — F411 Generalized anxiety disorder: Secondary | ICD-10-CM | POA: Diagnosis not present

## 2024-05-23 DIAGNOSIS — G4700 Insomnia, unspecified: Secondary | ICD-10-CM | POA: Diagnosis not present

## 2024-05-23 DIAGNOSIS — Z8673 Personal history of transient ischemic attack (TIA), and cerebral infarction without residual deficits: Secondary | ICD-10-CM

## 2024-05-23 DIAGNOSIS — F410 Panic disorder [episodic paroxysmal anxiety] without agoraphobia: Secondary | ICD-10-CM | POA: Diagnosis not present

## 2024-05-23 DIAGNOSIS — F429 Obsessive-compulsive disorder, unspecified: Secondary | ICD-10-CM

## 2024-05-23 MED ORDER — LORAZEPAM 1 MG PO TABS
1.0000 mg | ORAL_TABLET | Freq: Two times a day (BID) | ORAL | 2 refills | Status: DC
  Filled 2024-05-23: qty 60, 30d supply, fill #0
  Filled 2024-09-23: qty 60, 30d supply, fill #1

## 2024-05-23 MED ORDER — FLUOXETINE HCL 20 MG PO CAPS
ORAL_CAPSULE | ORAL | 2 refills | Status: DC
  Filled 2024-05-23: qty 90, 30d supply, fill #0
  Filled 2024-06-18: qty 90, 30d supply, fill #1
  Filled 2024-07-18: qty 90, 30d supply, fill #2

## 2024-05-23 NOTE — Progress Notes (Signed)
 Faith Jordan [id number] 1996/03/27 28 y.o.  Virtual Visit via Video Note  I connected with pt @ on 05/23/24 at 12:00 PM EDT by a video enabled telemedicine application and verified that I am speaking with the correct person using two identifiers.   I discussed the limitations of evaluation and management by telemedicine and the availability of in person appointments. The patient expressed understanding and agreed to proceed.  I discussed the assessment and treatment plan with the patient. The patient was provided an opportunity to ask questions and all were answered. The patient agreed with the plan and demonstrated an understanding of the instructions.   The patient was advised to call back or seek an in-person evaluation if the symptoms worsen or if the condition fails to improve as anticipated.  I provided 25 minutes of non-face-to-face time during this encounter.  The patient was located at home.  The provider was located at [facility].   Angeline LOISE Sayers, NP   Subjective:   Patient ID:  Faith Jordan is a 28 y.o. (DOB July 05, 1996) female.  Chief Complaint: No chief complaint on file.   HPI CHENAY NESMITH presents for follow-up of GAD, OCD, panic disorder, and insomnia.  Referred by therapist - Bambi Cottle.  Describes mood today as better. Pleasant. Reports increased tearfulness. Mood symptoms - reports anxiety and depression situational with medical issues. Reports improved interest and motivation. Denies irritability - more sensitive than I was. Reports recent panic attack. Denies worry and rumination. Reports some over thinking. Denies obsessive thoughts and acts. Reports mood as lower. Stating I feel like I've been dealt a bad hand. Feels like medications are helpful and does not wish to make any changes at this time. Taking medications as prescribed.   Energy levels lower - body gets fatigues. 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   Enjoys some usual interests and  activities. Divorced. Lives alone. Spending time with family.  Appetite decreased. Weight loss - 20+ pounds. Sleeps well most nights. Averages 10 hours. Reports less daytime napping - not nearly as bad.   Reports focus and concentration difficulties different since my stroke. Completing tasks. Managing aspects of household. Reports she has returned to work - part time - 12 hours a week. Denies SI or HI.  Denies AH or VH. Denies self harm. Denies substance use.  Previous medication trials:  Cymbalta , Gabapentin , Trazadone, Hydroxyzine , Propranolol , Wellbutrin , Lorazepam  and Lexapro , Prozac , Zoloft , Prazosin , Effexor  Review of Systems:  Review of Systems  Musculoskeletal:  Negative for gait problem.  Neurological:  Negative for tremors.  Psychiatric/Behavioral:         Please refer to HPI    Medications: I have reviewed the patient's current medications.  Current Outpatient Medications  Medication Sig Dispense Refill   acetaminophen  (TYLENOL ) 325 MG tablet Take 2 tablets (650 mg total) by mouth every 6 (six) hours as needed for mild pain (pain score 1-3), fever or headache.     atorvastatin  (LIPITOR ) 10 MG tablet Take 1 tablet (10 mg total) by mouth at bedtime. 90 tablet 3   atorvastatin  (LIPITOR ) 40 MG tablet Take 1 tablet (40 mg total) by mouth daily for cholesterol. 90 tablet 3   baclofen  (LIORESAL ) 10 MG tablet Take 1 tablet (10 mg total) by mouth 3 (three) times daily. (Patient taking differently: Take 10 mg by mouth in the morning and at bedtime.) 270 each 3   botulinum toxin Type A  (BOTOX ) 200 units injection Provider to inject 155 units into the muscles of  the head and neck every 12 weeks. Discard remainder. 1 each 3   Cholecalciferol (VITAMIN D3) 50 MCG (2000 UT) capsule Take 2,000 Units by mouth in the morning.     Coenzyme Q10 (COQ-10 PO) Take 1 tablet by mouth in the morning.     Galcanezumab -gnlm (EMGALITY ) 120 MG/ML SOAJ Inject 1 Dose into the skin every 30 (thirty)  days.     LORazepam  (ATIVAN ) 1 MG tablet Take 1 tablet (1 mg total) by mouth every 6 (six) hours as needed for anxiety. 30 tablet 0   modafinil  (PROVIGIL ) 100 MG tablet Take 0.5-1 tablets (50-100 mg total) by mouth daily. 30 tablet 5   ondansetron  (ZOFRAN ) 8 MG tablet Take 8 mg by mouth as needed for nausea or vomiting.     Rimegepant Sulfate  (NURTEC) 75 MG TBDP Take 1 tablet by mouth as needed.     topiramate  (TOPAMAX ) 50 MG tablet Take 1 tablet (50 mg total) by mouth daily. May also take 1 tablet (50 mg total) daily as needed (daily during menstrual cycle). 180 tablet 3   traZODone  (DESYREL ) 100 MG tablet Take 1 tablet (100 mg total) by mouth at bedtime. 90 tablet 0   Ubrogepant  (UBRELVY ) 100 MG TABS Take 1 tablet by mouth as needed.     vitamin B-12 (CYANOCOBALAMIN ) 500 MCG tablet Take 500 mcg by mouth daily.     No current facility-administered medications for this visit.    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, blistering peeling skin   Nsaids Dermatitis    Reaction similar to Stephens-Johnson, blistering peeling rash   Sulfa Antibiotics Swelling    Facial swelling    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   Acute flank pain 06/02/2022   Angio-edema 04/27/2016   Angioedema    Aquagenic angio-edema-urticaria    Asthma    no problems /not used recently   Dysautonomia (HCC)    Dysrhythmia    sinus tachycardia   Fibroid    right breast, adenoma   Headache(784.0)    History of COVID-19 11/21/2020   Hives 12/22/2015   Pneumonia    hx  6th grade   Sensation of fullness in both ears 02/02/2023   Snoring 02/11/2022   Urinary frequency 02/12/2019   Vaginal yeast infection 06/02/2022   Vasculitis (HCC)     Family History  Problem Relation Age of Onset   Depression Mother    Migraines Mother    Narcolepsy Mother     Hypothyroidism Mother    Breast cancer Maternal Grandmother 49   Myasthenia gravis Maternal Grandfather    Asthma Other    Cancer Other    Epilepsy Other    Allergic rhinitis Neg Hx    Angioedema Neg Hx    Atopy Neg Hx    Eczema Neg Hx    Immunodeficiency Neg Hx    Urticaria Neg Hx    Stroke Neg Hx     Social History   Socioeconomic History   Marital status: Significant Other    Spouse name: Not on file   Number of children: Not on file   Years of education: Not on file   Highest education level: Bachelor's degree (e.g., BA, AB, BS)  Occupational History   Not on file  Tobacco Use   Smoking status: Never   Smokeless tobacco: Never  Vaping Use  Vaping status: Never Used  Substance and Sexual Activity   Alcohol use: No   Drug use: No   Sexual activity: Yes  Other Topics Concern   Not on file  Social History Narrative   Lives with fiance   R handed   Caffeine : 1 drink a day   Social Drivers of Corporate investment banker Strain: Not on file  Food Insecurity: No Food Insecurity (01/09/2024)   Hunger Vital Sign    Worried About Running Out of Food in the Last Year: Never true    Ran Out of Food in the Last Year: Never true  Transportation Needs: No Transportation Needs (01/09/2024)   PRAPARE - Administrator, Civil Service (Medical): No    Lack of Transportation (Non-Medical): No  Physical Activity: Not on file  Stress: Not on file  Social Connections: Not on file  Intimate Partner Violence: Not At Risk (01/09/2024)   Humiliation, Afraid, Rape, and Kick questionnaire    Fear of Current or Ex-Partner: No    Emotionally Abused: No    Physically Abused: No    Sexually Abused: No    Past Medical History, Surgical history, Social history, and Family history were reviewed and updated as appropriate.   Please see review of systems for further details on the patient's review from today.   Objective:   Physical Exam:  There were no vitals taken for  this visit.  Physical Exam Constitutional:      General: She is not in acute distress. Musculoskeletal:        General: No deformity.  Neurological:     Mental Status: She is alert and oriented to person, place, and time.     Coordination: Coordination normal.  Psychiatric:        Attention and Perception: Attention and perception normal. She does not perceive auditory or visual hallucinations.        Mood and Affect: Mood is anxious and depressed. Affect is not labile, blunt, angry or inappropriate.        Speech: Speech normal.        Behavior: Behavior normal.        Thought Content: Thought content normal. Thought content is not paranoid or delusional. Thought content does not include homicidal or suicidal ideation. Thought content does not include homicidal or suicidal plan.        Cognition and Memory: Cognition and memory normal.        Judgment: Judgment normal.     Comments: Insight intact     Lab Review:     Component Value Date/Time   NA 139 02/24/2024 1521   NA 140 02/07/2024 0950   K 4.0 02/24/2024 1521   CL 107 02/24/2024 1521   CO2 20 (L) 02/24/2024 1515   GLUCOSE 122 (H) 02/24/2024 1521   BUN 6 02/24/2024 1521   BUN 9 02/07/2024 0950   CREATININE 0.90 02/24/2024 1521   CALCIUM  9.3 02/24/2024 1515   CALCIUM  8.9 01/07/2024 2231   PROT 6.8 02/24/2024 1515   PROT 7.2 02/07/2024 0950   ALBUMIN 3.9 02/24/2024 1515   ALBUMIN 4.7 02/07/2024 0950   AST 25 02/24/2024 1515   ALT 27 02/24/2024 1515   ALKPHOS 85 02/24/2024 1515   BILITOT 0.9 02/24/2024 1515   BILITOT 0.8 02/07/2024 0950   GFRNONAA >60 02/24/2024 1515   GFRAA >60 12/01/2017 1018       Component Value Date/Time   WBC 9.3 02/24/2024 1515   RBC 4.89  02/24/2024 1515   HGB 14.3 02/24/2024 1521   HGB 15.0 02/07/2024 0950   HCT 42.0 02/24/2024 1521   HCT 45.6 02/07/2024 0950   PLT 278 02/24/2024 1515   PLT 269 02/07/2024 0950   MCV 86.3 02/24/2024 1515   MCV 88 02/07/2024 0950   MCH 29.0  02/24/2024 1515   MCHC 33.6 02/24/2024 1515   RDW 13.0 02/24/2024 1515   RDW 13.1 02/07/2024 0950   LYMPHSABS 2.1 02/24/2024 1515   LYMPHSABS 2.0 02/07/2024 0950   MONOABS 0.8 02/24/2024 1515   EOSABS 0.1 02/24/2024 1515   EOSABS 0.0 02/07/2024 0950   BASOSABS 0.0 02/24/2024 1515   BASOSABS 0.0 02/07/2024 0950    No results found for: POCLITH, LITHIUM   No results found for: PHENYTOIN, PHENOBARB, VALPROATE, CBMZ   .res Assessment: Plan:    Plan:  PDMP reviewed  Increase Ativan  0.5mg  BID to 1mg  BID prn anxiety  Increase Prozac  40mg  to 60mg  daily for anxiety and depression Trazadone 100mg  at hs  Genesight testing on file.  RTC 4 weeks  25 minutes spent dedicated to the care of this patient on the date of this encounter to include pre-visit review of records, ordering of medication, post visit documentation, and face-to-face time with the patient discussing GAD, OCD, panic disorder and insomnia. Discussed continuing current medication regimen.  Patient advised to contact office with any questions, adverse effects, or acute worsening in signs and symptoms.  Discussed potential benefits, risk, and side effects of benzodiazepines to include potential risk of tolerance and dependence, as well as possible drowsiness.  Advised patient not to drive if experiencing drowsiness and to take lowest possible effective dose to minimize risk of dependence and tolerance.  There are no diagnoses linked to this encounter.   Please see After Visit Summary for patient specific instructions.  Future Appointments  Date Time Provider Department Center  05/24/2024  9:30 AM Plaster, Marlon FORBES [facility]  05/24/2024 10:45 AM Kirsteins, Prentice FORBES, MD [facility]  06/05/2024 10:00 AM Cottle, Peggye MATSU, LCSW [facility] None  06/14/2024  5:00 PM Cottle, Bambi G, LCSW [facility] None  06/25/2024  3:30 PM Lomax, Amy, NP [facility] None  07/04/2024 10:00 AM Cottle, Bambi G, LCSW [facility] None   07/10/2024 10:00 AM Cottle, Bambi G, LCSW [facility] None  07/10/2024  3:30 PM Ines Onetha NOVAK, MD [facility] None  11/26/2024 10:30 AM Lomax, Amy, NP [facility] None    No orders of the defined types were placed in this encounter.     -------------------------------

## 2024-05-24 ENCOUNTER — Encounter: Attending: Physical Medicine & Rehabilitation | Admitting: Physical Medicine & Rehabilitation

## 2024-05-24 ENCOUNTER — Other Ambulatory Visit: Payer: Self-pay

## 2024-05-24 ENCOUNTER — Encounter: Payer: Self-pay | Admitting: Physical Medicine & Rehabilitation

## 2024-05-24 ENCOUNTER — Ambulatory Visit: Payer: Self-pay | Attending: Physical Medicine & Rehabilitation | Admitting: Physical Therapy

## 2024-05-24 VITALS — BP 114/78 | HR 65 | Ht 67.0 in | Wt 151.8 lb

## 2024-05-24 DIAGNOSIS — R269 Unspecified abnormalities of gait and mobility: Secondary | ICD-10-CM | POA: Diagnosis not present

## 2024-05-24 DIAGNOSIS — I639 Cerebral infarction, unspecified: Secondary | ICD-10-CM | POA: Insufficient documentation

## 2024-05-24 DIAGNOSIS — R278 Other lack of coordination: Secondary | ICD-10-CM | POA: Insufficient documentation

## 2024-05-24 DIAGNOSIS — G8114 Spastic hemiplegia affecting left nondominant side: Secondary | ICD-10-CM | POA: Diagnosis not present

## 2024-05-24 DIAGNOSIS — M6281 Muscle weakness (generalized): Secondary | ICD-10-CM | POA: Insufficient documentation

## 2024-05-24 DIAGNOSIS — R2681 Unsteadiness on feet: Secondary | ICD-10-CM | POA: Diagnosis not present

## 2024-05-24 DIAGNOSIS — I69398 Other sequelae of cerebral infarction: Secondary | ICD-10-CM | POA: Diagnosis not present

## 2024-05-24 NOTE — Progress Notes (Signed)
 Subjective:    Patient ID: Faith Jordan, female    DOB: May 24, 1996, 28 y.o.   MRN: [id number] 28 y.o. 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  She plans to stay at her mother's on discharge.  Presented 01/07/2024 with acute onset of left-sided weakness and facial droop.  She denied any headache.  Cranial CT scan showed no acute intracranial abnormality.  CTA of the head and neck unremarkable.  Patient did not receive tPA.  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  Admission chemistries unremarkable except WBC 12,700, urine drug screen negative.  Beta-2  glycoprotein and lupus anticoagulant negative.  Protein S and C activity and normal Antithrombin III  normal.  Echocardiogram with ejection fraction of 60 to 65% no wall motion abnormalities.  TEE completed showing ejection fraction 65% no atrial appendage or thrombus.  Maintained on Plavix  for CVA prophylaxis allergies to NSAIDs thus was not placed on aspirin.  Lovenox  added for DVT prophylaxis.  She was currently on Topamax  for her headaches.  Therapy evaluations completed due to patient decreased functional mobility left-sided weakness was admitted for a comprehensive rehab program. HPI Patient states that since her stroke she has been nauseated.  We reviewed medication stroke is not in the brainstem or cerebellum.  Therefore we discussed that this is likely not a primary effect of the stroke.  We discussed that she is on some new medicine since she has had a stroke and medication such as atorvastatin  could cause nausea.  She has an  appointment with neurology and would like to discuss this with neurology next visit.  We also discussed that she may need to talk to her primary care about this as other GI causes may be responsible.  She states that not only is she nauseated but food taste funny.  We discussed the possibility of depression she is seeing a mental health provider and has been taking SSRIs.  Her mother who is with her today does not feel like this is an issue.  From a work standpoint she is doing well she is up to 4 hours 3 days a week she is only done this starting Monday of this week.  The patient's last shift this week is on Saturday.  Her normal work schedule is 12 hours 3 days/week. She is not lifting any patients yet so far is on a 20 pound lifting restriction.  She has not simulated any patient lifting. She is working on her ambulation speed as well as her endurance.  She rarely takes a nap anymore. Pain Inventory Average Pain 3 Pain Right Now 0 My pain is dull  In the last 24 hours, has pain interfered with the following? General activity 0 Relation with others 0 Enjoyment of life 0 What TIME of day is your pain at its worst? evening and night Sleep (in general) Fair  Pain is worse with: unsure Pain improves with: nothing Relief from Meds: 0  Family History  Problem Relation Age of Onset   Depression Mother    Migraines Mother    Narcolepsy Mother    Hypothyroidism Mother    Breast cancer Maternal Grandmother 95  Myasthenia gravis Maternal Grandfather    Asthma Other    Cancer Other    Epilepsy Other    Allergic rhinitis Neg Hx    Angioedema Neg Hx    Atopy Neg Hx    Eczema Neg Hx    Immunodeficiency Neg Hx    Urticaria Neg Hx    Stroke Neg Hx    Social History   Socioeconomic History   Marital status: Significant Other    Spouse name: Not on file   Number of children: Not on file   Years of education: Not on file   Highest education level: Bachelor's degree (e.g., BA, AB, BS)   Occupational History   Not on file  Tobacco Use   Smoking status: Never   Smokeless tobacco: Never  Vaping Use   Vaping status: Never Used  Substance and Sexual Activity   Alcohol use: No   Drug use: No   Sexual activity: Yes  Other Topics Concern   Not on file  Social History Narrative   Lives with fiance   R handed   Caffeine : 1 drink a day   Social Drivers of Corporate investment banker Strain: Not on file  Food Insecurity: No Food Insecurity (01/09/2024)   Hunger Vital Sign    Worried About Running Out of Food in the Last Year: Never true    Ran Out of Food in the Last Year: Never true  Transportation Needs: No Transportation Needs (01/09/2024)   PRAPARE - Administrator, Civil Service (Medical): No    Lack of Transportation (Non-Medical): No  Physical Activity: Not on file  Stress: Not on file  Social Connections: Not on file   Past Surgical History:  Procedure Laterality Date   ADENOIDECTOMY     BREAST LUMPECTOMY Right    BREAST LUMPECTOMY Right    MASS EXCISION Right 10/07/2014   Procedure: EXCISION OF RIGHT BREAST MASS;  Surgeon: Krystal Russell, MD;  Location: [facility] OR;  Service: General;  Laterality: Right;   TONSILLECTOMY AND ADENOIDECTOMY     TOOTH EXTRACTION     TRANSESOPHAGEAL ECHOCARDIOGRAM (CATH LAB) N/A 01/10/2024   Procedure: TRANSESOPHAGEAL ECHOCARDIOGRAM;  Surgeon: Jeffrie Oneil BROCKS, MD;  Location: [facility] INVASIVE CV LAB;  Service: Cardiovascular;  Laterality: N/A;   Past Surgical History:  Procedure Laterality Date   ADENOIDECTOMY     BREAST LUMPECTOMY Right    BREAST LUMPECTOMY Right    MASS EXCISION Right 10/07/2014   Procedure: EXCISION OF RIGHT BREAST MASS;  Surgeon: Krystal Russell, MD;  Location: [facility] OR;  Service: General;  Laterality: Right;   TONSILLECTOMY AND ADENOIDECTOMY     TOOTH EXTRACTION     TRANSESOPHAGEAL ECHOCARDIOGRAM (CATH LAB) N/A 01/10/2024   Procedure: TRANSESOPHAGEAL ECHOCARDIOGRAM;  Surgeon: Jeffrie Oneil BROCKS, MD;   Location: [facility] INVASIVE CV LAB;  Service: Cardiovascular;  Laterality: N/A;   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   Acute flank pain 06/02/2022   Angio-edema 04/27/2016   Angioedema    Aquagenic angio-edema-urticaria    Asthma    no problems /not used recently   Dysautonomia [facility])    Dysrhythmia    sinus tachycardia   Fibroid    right breast, adenoma   Headache(784.0)    History of COVID-19 11/21/2020   Hives 12/22/2015   Pneumonia    hx  6th grade   Sensation of fullness in both ears 02/02/2023   Snoring 02/11/2022   Urinary  frequency 02/12/2019   Vaginal yeast infection 06/02/2022   Vasculitis (HCC)    BP 114/78   Pulse 65   Ht 5' 7 (1.702 m)   Wt 151 lb 12.8 oz (68.9 kg)   SpO2 99%   BMI 23.78 kg/m   Opioid Risk Score:   Fall Risk Score:  `1  Depression screen [facility] 2/9     05/24/2024   10:37 AM 02/10/2024    1:44 PM 02/08/2024    8:36 AM 11/29/2023    8:08 AM 06/10/2022    2:28 PM 06/02/2022    8:27 AM 07/30/2021   10:19 AM  Depression screen PHQ 2/9  Decreased Interest 2 2 1  0  0 0  Down, Depressed, Hopeless 2 2 2  0  0 0  PHQ - 2 Score 4 4 3  0  0 0  Altered sleeping  2 0 0  0 0  Tired, decreased energy  2 2 0  3 0  Change in appetite  2 0 0  0 0  Feeling bad or failure about yourself   2 0 0  0 0  Trouble concentrating  0 0 0  0 0  Moving slowly or fidgety/restless  0 2 0  0 0  Suicidal thoughts  0 0 0  0 0  PHQ-9 Score  12 7 0  3 0  Difficult doing work/chores  Very difficult Not difficult at all Not difficult at all  Not difficult at all Not difficult at all     Information is confidential and restricted. Go to Review Flowsheets to unlock data.     Review of Systems  Musculoskeletal:        Right leg  Psychiatric/Behavioral:  Positive for dysphoric mood.   All other systems reviewed and are negative.      Objective:   Physical Exam  Motor strength is 4+ at the left deltoid bicep tricep finger  flexors and extensors 5/5 in the right deltoid bicep tricep finger flexors and extensors 5/5 bilateral hip flexor knee extensor ankle dorsiflexion Romberg is negative Sharpened Romberg is positive Has difficulty with tandem gait. She is able to toe walk. Finger thumb opposition is intact bilaterally No evidence of dysdiadochokinesis bilateral alternating supination pronation of the upper extremity Finger-nose-finger testing with mild dysmetria on the left side compared to right side      Assessment & Plan:   1.  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  She will continue 4 hours 3 days a week for this week and next week and the following week she will go up to 6 hours 3 days a week.  I will see her back in approximately 6 weeks.  She will call me with any other concerns about her work schedule as it may occur before that time. She will follow-up with neurology. In terms of her listing I think we can liberalize it 35 pounds.  We discussed that she may practice lifting a heavy bag of dog food from a seat level position. We discussed practicing stacking objects with the left upper extremity.  We discussed practicing tandem gait as well as continuing with her endurance training Discussed with patient mother answered questions.

## 2024-05-24 NOTE — Therapy (Signed)
 OUTPATIENT PHYSICAL THERAPY NEURO TREATMENT - DISCHARGE SUMMARY    Patient Name: Faith Jordan MRN: [id number] DOB:[birth year], 28 y.o., female Today's Date: 05/24/2024   PCP: Gretta Comer POUR, NP REFERRING PROVIDER: Carilyn Prentice BRAVO, MD  PHYSICAL THERAPY DISCHARGE SUMMARY  Visits from Start of Care: 28  Current functional level related to goals / functional outcomes: Mod I with all ADLs, driving, working    Remaining deficits: LLE extensor tone    Education / Equipment: HEP   Patient agrees to discharge. Patient goals were met. Patient is being discharged due to meeting the stated rehab goals.    END OF SESSION:  PT End of Session - 05/24/24 0932     Visit Number 28    Number of Visits 32   recert   Date for PT Re-Evaluation 05/30/24   recert   Authorization Type [employer]    PT Start Time [phone]    PT Stop Time 1006    PT Time Calculation (min) 35 min    Equipment Utilized During Treatment --    Activity Tolerance Patient tolerated treatment well    Behavior During Therapy [facility] for tasks assessed/performed                    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   Acute flank pain 06/02/2022   Angio-edema 04/27/2016   Angioedema    Aquagenic angio-edema-urticaria    Asthma    no problems /not used recently   Dysautonomia (HCC)    Dysrhythmia    sinus tachycardia   Fibroid    right breast, adenoma   Headache(784.0)    History of COVID-19 11/21/2020   Hives 12/22/2015   Pneumonia    hx  6th grade   Sensation of fullness in both ears 02/02/2023   Snoring 02/11/2022   Urinary frequency 02/12/2019   Vaginal yeast infection 06/02/2022   Vasculitis (HCC)    Past Surgical History:  Procedure Laterality Date   ADENOIDECTOMY     BREAST LUMPECTOMY Right    BREAST LUMPECTOMY Right    MASS EXCISION Right 10/07/2014   Procedure: EXCISION OF RIGHT BREAST MASS;  Surgeon: Krystal Russell,  MD;  Location: [facility] OR;  Service: General;  Laterality: Right;   TONSILLECTOMY AND ADENOIDECTOMY     TOOTH EXTRACTION     TRANSESOPHAGEAL ECHOCARDIOGRAM (CATH LAB) N/A 01/10/2024   Procedure: TRANSESOPHAGEAL ECHOCARDIOGRAM;  Surgeon: Jeffrie Oneil BROCKS, MD;  Location: [facility] INVASIVE CV LAB;  Service: Cardiovascular;  Laterality: N/A;   Patient Active Problem List   Diagnosis Date Noted   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) 04/09/2024   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 (CVA) 04/05/2024   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) 02/10/2024   Coping style affecting medical condition 01/19/2024   History of CVA (cerebrovascular accident) 01/12/2024   Stroke of right basal ganglia (HCC) 01/07/2024   Non-restorative sleep 05/26/2022   Sleep paralysis, 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   Vivid dream 05/26/2022   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   Excessive daytime sleepiness 05/26/2022   Sleep disturbance 03/17/2022   Panic attacks 02/11/2022   Vitamin B12 deficiency 07/30/2021   Vitamin D  deficiency 07/30/2021   Family history of hypothyroidism 07/30/2021   GAD (generalized anxiety disorder) 03/18/2020   Fatigue 11/06/2019   Chronic back pain 07/06/2019   Preventative health care 04/27/2018   Migraines  04/25/2018   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   Anemia 01/04/2018   Headache disorder 12/29/2015   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   Sinus tachycardia 12/22/2015   Dysautonomia (HCC) 12/22/2015   Dysautonomia, familial (HCC) 04/22/2015   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   Neurocardiogenic syncope 04/06/2013   Intermittent palpitations 04/06/2013    ONSET DATE: 01/26/2024 (referral)   REFERRING DIAG:  I63.81 (ICD-10-CM) - Other cerebral infarction due to occlusion or stenosis of small artery    THERAPY DIAG:  Muscle weakness  (generalized)  Unsteadiness on feet  Other lack of coordination  Rationale for Evaluation and Treatment: Rehabilitation  SUBJECTIVE:                                                                                                                                                                                             SUBJECTIVE STATEMENT:  Pt presents with mother. Pt reports she has increased her hours at work to 3 days per week and is going well, is looking forward to increasing to 8 hour work days. Pt states she did a speed walk on the treadmill for one mile and tried to run, but her leg started to spasm so she stopped. Her brother has ordered more equipment for her home gym and she has been lifting more.   Is still nervous to DC today as she does not feel as though she is 100% but is in agreement that she is ready for DC and has exercises she can continue to work on.   Still dealing with nausea that prevents her from being able to eat. Will have one day that she can barely tolerate a protein shake but then will feel hungry the next day.    Pt accompanied by: Mother, Tammy  PERTINENT HISTORY: familiar hemiplegic migraine, angioedema 2017,dysautonomia; infarct right internal capsule/ R basal ganglia stroke  PAIN:  Are you having pain? No    PRECAUTIONS: Fall  RED FLAGS: None   WEIGHT BEARING RESTRICTIONS: No  FALLS: Has patient fallen in last 6 months? No  LIVING ENVIRONMENT: Lives with: lives with their family Lives in: House/apartment Stairs: Yes: External: 4 steps; bilateral but cannot reach both Has following equipment at home: shower chair  PLOF: Independent  PATIENT GOALS: Being normal again. Being able to go back to work and be independent   OBJECTIVE:  Note: Objective measures were completed at Evaluation unless otherwise noted.  DIAGNOSTIC FINDINGS: MRI of brain on 01/07/24  IMPRESSION: 1. 10 mm acute nonhemorrhagic infarct of the posterior limb of  the right internal capsule. 2. Susceptibility related calcifications in the globus pallidus  bilaterally. This likely reflects the sequela of chronic microvascular ischemia.  COGNITION: Overall cognitive status: Within functional limits for tasks assessed   SENSATION: Pt denies numbness/tingling in L hemibody   MUSCLE TONE: Non-fatiguing clonus of LLE  POSTURE: No Significant postural limitations  LOWER EXTREMITY ROM:     Active  Right Eval Left Eval  Hip flexion    Hip extension    Hip abduction    Hip adduction    Hip internal rotation    Hip external rotation    Knee flexion    Knee extension    Ankle dorsiflexion    Ankle plantarflexion    Ankle inversion    Ankle eversion     (Blank rows = not tested)  LOWER EXTREMITY MMT:  Tested in seated position   MMT Right Eval Left Eval  Hip flexion 5 4+  Hip extension    Hip abduction 5 5  Hip adduction 5 5  Hip internal rotation    Hip external rotation    Knee flexion 5 5  Knee extension 5 4+  Ankle dorsiflexion 5 5  Ankle plantarflexion    Ankle inversion    Ankle eversion    (Blank rows = not tested)  BED MOBILITY:  Not tested  TRANSFERS: Sit to stand: Complete Independence  Assistive device utilized: None     Stand to sit: Complete Independence  Assistive device utilized: None      RAMP:  Not tested  CURB:  Not tested  GAIT: Gait pattern: WFL Distance walked: Various clinic distances  Assistive device utilized: None Level of assistance: Complete Independence Comments: Pt continues to wear medial wedge in L shoe to assist w/ankle eversion   PATIENT SURVEYS:  ABC scale to be obtained    VITALS  There were no vitals filed for this visit.                                                                                                                               TREATMENT:   Self-Care/Home Management Provided encouragement to pt regarding how hard she has worked in PT and the progress  she has made. Informed pt that she can always return to PT in future if needed, but would recommend she focus on her goals of increasing her hours at work and lifting at home to continue building up strength and stamina. Pt in agreement with this. Provided pt w/PT's work email if pt has any questions or concerns.   Ther Act - LTG assessment   [facility] PT Assessment - 05/24/24 0954       Ambulation/Gait   Gait velocity 32.8' over 7.94s = 4.13 ft/s   no AD       Single leg stance on LLE : 31.38s       PATIENT EDUCATION: Education details: continue HEP, see self-care, goal results  Person educated: Patient and Parent Education method: Explanation and Demonstration Education comprehension: verbalized understanding and  returned demonstration  HOME EXERCISE PROGRAM: Access Code: [email or web address] URL: [email or web address] Date: 05/02/2024 Prepared by: Marlon Cheney Ewart  Exercises - Half Kneeling Hip Flexor Stretch  - 1 x daily - 7 x weekly - 3 sets - 10 reps - Kneeling adductor stretch   - 1 x daily - 7 x weekly - 3 sets - 10 reps - Single leg bridge with leg lift   - 1 x daily - 7 x weekly - 1-2 sets - 7-10 reps - Modified Thomas Stretch  - 1 x daily - 7 x weekly - 2-3 sets - 1-2 minutes hold - Supine Straight Leg Lumbar Rotation Stretch  - 1 x daily - 7 x weekly - 3 sets - 10 reps - Psoas Mobilization with Small Ball  - 1 x daily - 7 x weekly - 3 sets - 2-3 minute  hold - Pigeon Pose  - 1 x daily - 7 x weekly - 1-2 sets - 1-2 minutes hold  Verbally added 03/27/24  - Pallof press rotations w/red theraband   GOALS: Goals reviewed with patient? Yes  SHORT TERM GOALS:   Target date: 04/05/2024  Pt will improve her score on the HiMat to >/= 26/54 to demonstrate improved balance and function Baseline: 11/54 (5/13), 21/54 (5/29), 13/54 (6/23) Goal status: NOT MET  2.  Pt will be able to perform 5 deep squats and return to standing to demonstrate improved function Baseline: 2  squats before unable to stand up without physical assist, 5 squats mod I (6/23) Goal status: MET  3.  Manual TUG to be assessed and LTG set Baseline: to be assessed Goal status: MET  4.  Cognitive TUG to be assessed and LTG set Baseline: to be assessed Goal status: MET    LONG TERM GOALS:  Target date: 04/26/2024  Pt will improve gait velocity to at least 3.75 ft/s for improved gait efficiency and independence Baseline: 3.0 ft/sec (eval), 3.31 ft/sec (5/13), 3.57 ft/sec (5/29); 3.66 ft/s (7/9) Goal status: IN PROGRESS  2.  Pt will improve her score on the HiMat to >/= 25/54 to demonstrate improved balance and function Baseline: 11/54 (5/13), 21/54 (5/29), 13/54 (6/23); 26/54 (7/9) Goal status: MET  3.  Pt will be able to perform 10 deep squats and return to standing to demonstrate improved function Baseline: 2 squats before unable to stand up without physical assist Goal status: MET  4.  Pt will improve manual TUG to less than or equal to 12 seconds for improved functional mobility and decreased fall risk.  Baseline: 17.59s; 9.72s holding cup in LUE Goal status: MET  5.  Pt will improve cognitive TUG to less than or equal to 10 seconds for improved functional mobility and decreased fall risk.  Baseline: 12.35s counting backwards by 8; 7s retro counting by 8 Goal status: MET   NEW LONG TERM GOALS:  Target date: 05/24/2024  Pt will improve gait velocity to at least 3.75 ft/s for improved gait efficiency and independence Baseline: 3.0 ft/sec (eval), 3.31 ft/sec (5/13), 3.57 ft/sec (5/29); 3.66 ft/s (7/9); 4.13 ft/s (8/7) Goal status: MET  2.  Pt will hold single leg stance on LLE for >/= 20s for improved hip abductor strength, reduced fall risk and ankle proprioception  Baseline: 10.18s; 31.38s (8/7) Goal status: MET   ASSESSMENT:  CLINICAL IMPRESSION: Emphasis of skilled PT session on LTG assessment and DC from PT. Pt has met 2/2 LTGs, improving her gait speed to 4.13  ft/s and holding single leg stance >  30s on LLE. Pt reports she has increased her work days to 3 days per week and is feeling good, no longer needs to take naps daily. Pt has also been working on lifting weights and jogging at her home gym. Pt continues to be limited by extensor tone in LLE when fatigued, but is able to perform more activity prior to this occurring and continues to work on her stamina at home. Pt in agreement to DC this date due to returning to work and meeting stated rehab goals.    OBJECTIVE IMPAIRMENTS: Abnormal gait, decreased activity tolerance, decreased balance, decreased coordination, decreased endurance, decreased mobility, decreased strength, increased muscle spasms, impaired UE functional use, improper body mechanics, and pain  ACTIVITY LIMITATIONS: carrying, lifting, squatting, stairs, reach over head, hygiene/grooming, and locomotion level  PARTICIPATION LIMITATIONS: meal prep, cleaning, laundry, interpersonal relationship, driving, shopping, community activity, occupation, and yard work  PERSONAL FACTORS: 1 comorbidity: R internal capsule infarct/R basal ganglia stroke are also affecting patient's functional outcome.   REHAB POTENTIAL: Excellent  CLINICAL DECISION MAKING: Evolving/moderate complexity  EVALUATION COMPLEXITY: Moderate  PLAN:  PT FREQUENCY: 1-2x/week  PT DURATION: 6 weeks + 4 weeks (recert)   PLANNED INTERVENTIONS: 02835- PT Re-evaluation, 97750- Physical Performance Testing, 97110-Therapeutic exercises, 97530- Therapeutic activity, [id number]- Neuromuscular re-education, 97535- Self Care, 02859- Manual therapy, [phone]- Gait training, [phone]- Canalith repositioning, [phone]- Aquatic Therapy, Patient/Family education, Balance training, Stair training, Taping, Dry Needling, Joint mobilization, Spinal mobilization, Vestibular training, and DME instructions   Marlon BRAVO Mallorie Norrod, PT, DPT  05/24/2024, 10:10 AM

## 2024-05-25 ENCOUNTER — Telehealth: Payer: Self-pay | Admitting: Primary Care

## 2024-05-25 ENCOUNTER — Other Ambulatory Visit: Payer: Self-pay

## 2024-05-25 DIAGNOSIS — Q2112 Patent foramen ovale: Secondary | ICD-10-CM | POA: Diagnosis not present

## 2024-05-25 DIAGNOSIS — Z8673 Personal history of transient ischemic attack (TIA), and cerebral infarction without residual deficits: Secondary | ICD-10-CM

## 2024-05-25 DIAGNOSIS — I6389 Other cerebral infarction: Secondary | ICD-10-CM | POA: Diagnosis not present

## 2024-05-25 DIAGNOSIS — I6930 Unspecified sequelae of cerebral infarction: Secondary | ICD-10-CM | POA: Diagnosis not present

## 2024-05-25 MED ORDER — CLOPIDOGREL BISULFATE 75 MG PO TABS
75.0000 mg | ORAL_TABLET | Freq: Every day | ORAL | 1 refills | Status: AC
  Filled 2024-05-25: qty 90, 90d supply, fill #0
  Filled 2024-08-19: qty 90, 90d supply, fill #1

## 2024-05-25 NOTE — Telephone Encounter (Signed)
 Copied from CRM #[id number]. Topic: Clinical - Medication Refill >> May 25, 2024  9:41 AM [name] wrote: Medication: clopidogrel  (PLAVIX ) tablet 300 mg  **PATIENT STATES SHE HASN'T BEEN MADE AWARE BY ANY PROVIDER TO STOP TAKING MEDICATION. PATIENT WAS ADVISED SHE WILL BE ON THIS MEDICATION THE REST OF HER LIFE*  Has the patient contacted their pharmacy? Yes   This is the patient's preferred pharmacy:  [facility] [address] [place] [address] Phone: [phone] Fax: [phone]   Is this the correct pharmacy for this prescription? Yes If no, delete pharmacy and type the correct one.   Has the prescription been filled recently? No  Is the patient out of the medication? No-PATIENT WILL BE OUT OF MEDICATION THIS WEEKEND  Has the patient been seen for an appointment in the last year OR does the patient have an upcoming appointment? Yes  Can we respond through MyChart? Yes  Agent: Please be advised that Rx refills may take up to 3 business days. We ask that you follow-up with your pharmacy.

## 2024-05-25 NOTE — Telephone Encounter (Signed)
 Spoke with patient via phone.  She is managed on clopidogrel  75 mg daily.  Reviewed neurology notes from June 2025 which confirms.  Refill sent to pharmacy.

## 2024-05-28 ENCOUNTER — Other Ambulatory Visit: Payer: Self-pay

## 2024-05-30 DIAGNOSIS — I639 Cerebral infarction, unspecified: Secondary | ICD-10-CM | POA: Diagnosis not present

## 2024-05-30 DIAGNOSIS — Z1331 Encounter for screening for depression: Secondary | ICD-10-CM | POA: Diagnosis not present

## 2024-05-31 DIAGNOSIS — J90 Pleural effusion, not elsewhere classified: Secondary | ICD-10-CM | POA: Diagnosis not present

## 2024-05-31 DIAGNOSIS — I639 Cerebral infarction, unspecified: Secondary | ICD-10-CM | POA: Diagnosis not present

## 2024-06-04 ENCOUNTER — Encounter: Payer: Self-pay | Admitting: Primary Care

## 2024-06-04 ENCOUNTER — Ambulatory Visit (INDEPENDENT_AMBULATORY_CARE_PROVIDER_SITE_OTHER): Admitting: Primary Care

## 2024-06-04 ENCOUNTER — Other Ambulatory Visit: Payer: Self-pay

## 2024-06-04 VITALS — BP 126/78 | HR 77 | Temp 98.1°F | Ht 67.0 in | Wt 146.0 lb

## 2024-06-04 DIAGNOSIS — R110 Nausea: Secondary | ICD-10-CM | POA: Insufficient documentation

## 2024-06-04 DIAGNOSIS — R634 Abnormal weight loss: Secondary | ICD-10-CM | POA: Diagnosis not present

## 2024-06-04 MED ORDER — ONDANSETRON 4 MG PO TBDP
4.0000 mg | ORAL_TABLET | Freq: Three times a day (TID) | ORAL | 0 refills | Status: AC | PRN
  Filled 2024-06-04: qty 20, 7d supply, fill #0

## 2024-06-04 NOTE — Progress Notes (Signed)
 Subjective:    Patient ID: Faith Jordan, female    DOB: 1996/08/28, 28 y.o.   MRN: [id number]  History of Present Illness    Faith Jordan is a very pleasant 28 y.o. female with a history of migraines, CVA, neurocardiogenic syncope, palpitations, fatigue who presents today to discuss nausea.  Her nausea began in March 2025 just after her stroke. Initially after her stroke she had problems physically chewing and swallowing which has improved. Since then she's feeling nauseated intermittently throughout the day, every day. Her nausea occurs with and without food. Nausea occurs with smells of certain foods, fried foods, processed foods. She's been unable to eat much during the day due to her nausea.  She does have occasional intermittent generalized abdominal pain during episodes of nausea.  She underwent MRI chest through lower extremities in August 2025 per neurology at [facility] which showed a contracted gall bladder.   She sometimes experiences esophageal burning and belching without vomiting. She has a family history of gall bladder disease in mother, maternal aunt, maternal cousin.   She has discussed her ongoing nausea with both neurology and physical medicine, both of which believe her nausea secondary to depression.  She does not believe this is the case.  She denies vomiting, changes in medications.  She does not believe her nausea is secondary to her medications as she has been on many of these long-term.  Her atorvastatin  was reduced to 5 mg daily.  BP Readings from Last 3 Encounters:  06/04/24 126/78  05/24/24 114/78  05/16/24 105/80   Wt Readings from Last 3 Encounters:  06/04/24 146 lb (66.2 kg)  05/24/24 151 lb 12.8 oz (68.9 kg)  04/19/24 155 lb (70.3 kg)      Review of Systems  Constitutional:  Negative for fever.  Gastrointestinal:  Positive for abdominal pain, constipation and nausea. Negative for diarrhea and vomiting.         Past Medical History:   Diagnosis Date   Acne    Acquired breast deformity 12/22/2015   Acute flank pain 06/02/2022   Angio-edema 04/27/2016   Angioedema    Aquagenic angio-edema-urticaria    Asthma    no problems /not used recently   Dysautonomia [facility])    Dysrhythmia    sinus tachycardia   Fibroid    right breast, adenoma   Headache(784.0)    History of COVID-19 11/21/2020   Hives 12/22/2015   Pneumonia    hx  6th grade   Sensation of fullness in both ears 02/02/2023   Snoring 02/11/2022   Urinary frequency 02/12/2019   Vaginal yeast infection 06/02/2022   Vasculitis (HCC)     Social History   Socioeconomic History   Marital status: Significant Other    Spouse name: Not on file   Number of children: Not on file   Years of education: Not on file   Highest education level: Bachelor's degree (e.g., BA, AB, BS)  Occupational History   Not on file  Tobacco Use   Smoking status: Never   Smokeless tobacco: Never  Vaping Use   Vaping status: Never Used  Substance and Sexual Activity   Alcohol use: No   Drug use: No   Sexual activity: Yes  Other Topics Concern   Not on file  Social History Narrative   Lives with fiance   R handed   Caffeine : 1 drink a day   Social Drivers of Corporate investment banker Strain: Not on file  Food  Insecurity: No Food Insecurity (01/09/2024)   Hunger Vital Sign    Worried About Running Out of Food in the Last Year: Never true    Ran Out of Food in the Last Year: Never true  Transportation Needs: No Transportation Needs (01/09/2024)   PRAPARE - Administrator, Civil Service (Medical): No    Lack of Transportation (Non-Medical): No  Physical Activity: Not on file  Stress: Not on file  Social Connections: Not on file  Intimate Partner Violence: Not At Risk (01/09/2024)   Humiliation, Afraid, Rape, and Kick questionnaire    Fear of Current or Ex-Partner: No    Emotionally Abused: No    Physically Abused: No    Sexually Abused: No    Past  Surgical History:  Procedure Laterality Date   ADENOIDECTOMY     BREAST LUMPECTOMY Right    BREAST LUMPECTOMY Right    MASS EXCISION Right 10/07/2014   Procedure: EXCISION OF RIGHT BREAST MASS;  Surgeon: Krystal Russell, MD;  Location: [facility] OR;  Service: General;  Laterality: Right;   TONSILLECTOMY AND ADENOIDECTOMY     TOOTH EXTRACTION     TRANSESOPHAGEAL ECHOCARDIOGRAM (CATH LAB) N/A 01/10/2024   Procedure: TRANSESOPHAGEAL ECHOCARDIOGRAM;  Surgeon: Jeffrie Oneil BROCKS, MD;  Location: [facility] INVASIVE CV LAB;  Service: Cardiovascular;  Laterality: N/A;    Family History  Problem Relation Age of Onset   Depression Mother    Migraines Mother    Narcolepsy Mother    Hypothyroidism Mother    Breast cancer Maternal Grandmother 61   Myasthenia gravis Maternal Grandfather    Asthma Other    Cancer Other    Epilepsy Other    Allergic rhinitis Neg Hx    Angioedema Neg Hx    Atopy Neg Hx    Eczema Neg Hx    Immunodeficiency Neg Hx    Urticaria Neg Hx    Stroke Neg Hx     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, blistering peeling skin   Nsaids Dermatitis    Reaction similar to Stephens-Johnson, blistering peeling rash   Sulfa Antibiotics Swelling    Facial swelling    Current Outpatient Medications on File Prior to Visit  Medication Sig Dispense Refill   acetaminophen  (TYLENOL ) 325 MG tablet Take 2 tablets (650 mg total) by mouth every 6 (six) hours as needed for mild pain (pain score 1-3), fever or headache.     atorvastatin  (LIPITOR ) 10 MG tablet Take 1 tablet (10 mg total) by mouth at bedtime. 90 tablet 3   baclofen  (LIORESAL ) 10 MG tablet Take 1 tablet (10 mg total) by mouth 3 (three) times daily. (Patient taking differently: Take 10 mg by mouth in the morning and at bedtime.) 270 each 3   botulinum toxin Type A  (BOTOX ) 200 units injection Provider to inject 155 units into the muscles of the head and neck every 12 weeks.  Discard remainder. 1 each 3   Cholecalciferol (VITAMIN D3) 50 MCG (2000 UT) capsule Take 2,000 Units by mouth in the morning.     clopidogrel  (PLAVIX ) 75 MG tablet Take 1 tablet (75 mg total) by mouth daily. 90 tablet 1   Coenzyme Q10 (COQ-10 PO) Take 1 tablet by mouth in the morning.     FLUoxetine  (PROZAC ) 20 MG capsule Take three capsules by mouth at bedtime. 90 capsule 2   Galcanezumab -gnlm (EMGALITY ) 120 MG/ML SOAJ Inject 1 Dose into the skin every 30 (thirty)  days.     LORazepam  (ATIVAN ) 1 MG tablet Take 1 tablet (1 mg total) by mouth 2 (two) times daily. 60 tablet 2   modafinil  (PROVIGIL ) 100 MG tablet Take 0.5-1 tablets (50-100 mg total) by mouth daily. 30 tablet 5   Rimegepant Sulfate  (NURTEC) 75 MG TBDP Take 1 tablet by mouth as needed.     topiramate  (TOPAMAX ) 50 MG tablet Take 1 tablet (50 mg total) by mouth daily. May also take 1 tablet (50 mg total) daily as needed (daily during menstrual cycle). 180 tablet 3   traZODone  (DESYREL ) 100 MG tablet Take 1 tablet (100 mg total) by mouth at bedtime. 90 tablet 0   Ubrogepant  (UBRELVY ) 100 MG TABS Take 1 tablet by mouth as needed.     vitamin B-12 (CYANOCOBALAMIN ) 500 MCG tablet Take 500 mcg by mouth daily.     atorvastatin  (LIPITOR ) 40 MG tablet Take 1 tablet (40 mg total) by mouth daily for cholesterol. (Patient not taking: Reported on 06/04/2024) 90 tablet 3   No current facility-administered medications on file prior to visit.    BP 126/78   Pulse 77   Temp 98.1 F (36.7 C) (Temporal)   Ht 5' 7 (1.702 m)   Wt 146 lb (66.2 kg)   LMP 05/11/2024   SpO2 98%   BMI 22.87 kg/m  Objective:   Physical Exam Cardiovascular:     Rate and Rhythm: Normal rate and regular rhythm.  Pulmonary:     Effort: Pulmonary effort is normal.     Breath sounds: Normal breath sounds.  Abdominal:     General: Bowel sounds are normal.     Palpations: Abdomen is soft.     Tenderness: There is no abdominal tenderness.  Musculoskeletal:     Cervical  back: Neck supple.  Skin:    General: Skin is warm and dry.  Neurological:     Mental Status: She is alert and oriented to person, place, and time.  Psychiatric:        Mood and Affect: Mood normal.     Physical Exam        Assessment & Plan:  Chronic nausea Assessment & Plan: No alarm signs on exam today.  There is evidence of progressive weight loss since March 2025. Will obtain ultrasound of right upper quadrant for further evaluation of gallbladder. Consider HIDA scan if US  without cause for symptoms.  Rx for Zofran  4 mg ODT prescribed to use as needed.  Reviewed labs from May 2025.  Orders: -     US  ABDOMEN LIMITED RUQ (LIVER/GB); Future -     Ondansetron ; Take 1 tablet (4 mg total) by mouth every 8 (eight) hours as needed for nausea or vomiting.  Dispense: 20 tablet; Refill: 0  Weight loss -     US  ABDOMEN LIMITED RUQ (LIVER/GB); Future    Assessment and Plan Assessment & Plan         Comer MARLA Gaskins, NP

## 2024-06-04 NOTE — Assessment & Plan Note (Signed)
 No alarm signs on exam today.  There is evidence of progressive weight loss since March 2025. Will obtain ultrasound of right upper quadrant for further evaluation of gallbladder. Consider HIDA scan if US  without cause for symptoms.  Rx for Zofran  4 mg ODT prescribed to use as needed.  Reviewed labs from May 2025.

## 2024-06-04 NOTE — Patient Instructions (Signed)
 You will receive a phone call today regarding your ultrasound.  It was a pleasure to see you today!

## 2024-06-05 ENCOUNTER — Ambulatory Visit (INDEPENDENT_AMBULATORY_CARE_PROVIDER_SITE_OTHER): Admitting: Psychology

## 2024-06-05 ENCOUNTER — Ambulatory Visit
Admission: RE | Admit: 2024-06-05 | Discharge: 2024-06-05 | Disposition: A | Discharge status: Home / Self Care | Source: Ambulatory Visit | Attending: Primary Care | Admitting: Primary Care

## 2024-06-05 DIAGNOSIS — F429 Obsessive-compulsive disorder, unspecified: Secondary | ICD-10-CM | POA: Diagnosis not present

## 2024-06-05 DIAGNOSIS — R634 Abnormal weight loss: Secondary | ICD-10-CM | POA: Diagnosis not present

## 2024-06-05 DIAGNOSIS — F331 Major depressive disorder, recurrent, moderate: Secondary | ICD-10-CM | POA: Diagnosis not present

## 2024-06-05 DIAGNOSIS — R110 Nausea: Secondary | ICD-10-CM | POA: Insufficient documentation

## 2024-06-05 DIAGNOSIS — F411 Generalized anxiety disorder: Secondary | ICD-10-CM | POA: Diagnosis not present

## 2024-06-05 NOTE — Progress Notes (Unsigned)
                edge,  experiencing concentration difficulties, having trouble falling or staying asleep, exhibiting a general  state of irritability).: No Description Entered (Status: improved). Motor tension (e.g., restlessness,  tiredness, shakiness, muscle tension).: No Description Entered (Status: improved).  Problems Addressed  Anxiety, Phase Of Life Problems, Anxiety  Goals 1. Learn and implement coping skills that result in a reduction of anxiety  and worry, and improved daily functioning. Objective Learn  and implement calming skills to reduce overall anxiety and manage anxiety symptoms. Target Date: 2025-08-09Frequency: Weekly Progress: 40 Modality: individual  Related Interventions 1. Teach the client calming/relaxation skills (e.g., applied relaxation, progressive muscle  relaxation, cue controlled relaxation; mindful breathing; biofeedback) and how to discriminate  better between relaxation and tension; teach the client how to apply these skills to his/her daily  life (e.g., New Directions in Progressive Muscle Relaxation by Marcelyn Ditty, and  Hazlett-Stevens; Treating Generalized Anxiety Disorder by Rygh and Ida Rogue). Objective Identify, challenge, and replace biased, fearful self-talk with positive, realistic, and empowering selftalk. Target Date: 2024-05-26 Frequency: weekly Progress: 30 Modality: individual Related Interventions 1. Explore the client's schema and self-talk that mediate his/her fear response; assist him/her in  challenging the biases; replace the distorted messages with reality-based alternatives and  positive, realistic self-talk that will increase his/her self-confidence in coping with irrational  fears (see Cognitive Therapy of Anxiety Disorders by Laurence Slate). Objective Learn and implement problem-solving strategies for realistically addressing worries. Target Date: 2025-08-09Frequency: weekly Progress: 40 Modality: individual 2. Resolve conflicted feelings and adapt to the new life circumstances. Objective Apply problem-solving skills to current circumstances. Target Date: 2024-05-26 Frequency: weekly Progress: 20 Modality: individual Related Interventions 1. Teach the client problem-resolution skills (e.g., defining the problem clearly, brainstorming  multiple solutions, listing the pros and cons of each solution, seeking input from others,  selecting and implementing a plan of action, evaluating outcome, and readjusting plan as   necessary).   3. Stabilize anxiety level while increasing ability to function on a daily  basis. Diagnosis F33.1  Major depressive disorder, moderate 300.02 (Generalized anxiety disorder) - Open - [Signifier: n/a]  Axis  none 309.28 (Adjustment disorder with mixed anxiety and depressed  mood) - Open - [Signifier: n/a]  Adjustment Disorder,  With Anxiety   Marital conflict  Major Depressive disorder, moderate  Conditions For Discharge Achievement of treatment goals and objectives.  The patient approved this plan.   Deonna Krummel G Ethridge Sollenberger, LCSW

## 2024-06-06 ENCOUNTER — Telehealth: Payer: Self-pay

## 2024-06-06 ENCOUNTER — Ambulatory Visit: Payer: Self-pay | Admitting: Primary Care

## 2024-06-06 DIAGNOSIS — R110 Nausea: Secondary | ICD-10-CM

## 2024-06-06 DIAGNOSIS — R634 Abnormal weight loss: Secondary | ICD-10-CM

## 2024-06-06 NOTE — Telephone Encounter (Signed)
 Received a return to work form from [employer] for patient. Per MR, we do not charge for this form. Bringing to POD 4 for completion.

## 2024-06-11 ENCOUNTER — Other Ambulatory Visit: Payer: Self-pay | Admitting: Pharmacy Technician

## 2024-06-11 ENCOUNTER — Other Ambulatory Visit: Payer: Self-pay

## 2024-06-11 ENCOUNTER — Telehealth: Admitting: Adult Health

## 2024-06-11 NOTE — Progress Notes (Signed)
 Specialty Pharmacy Refill Coordination Note  Faith Jordan is a 28 y.o. female assessed today regarding refills of clinic administered specialty medication(s) OnabotulinumtoxinA  (BOTOX )   Clinic requested Courier to Provider Office   Delivery date: 06/20/24   Verified address: [facility] [address] [place], [place] [address]   Medication will be filled on 06/19/24.  Appt on 9/8. Clean Claim $0.00.    Sent mychart message Clear bag patient.

## 2024-06-12 ENCOUNTER — Encounter: Payer: Self-pay | Admitting: Physical Medicine & Rehabilitation

## 2024-06-14 ENCOUNTER — Ambulatory Visit (INDEPENDENT_AMBULATORY_CARE_PROVIDER_SITE_OTHER): Admitting: Psychology

## 2024-06-14 DIAGNOSIS — F411 Generalized anxiety disorder: Secondary | ICD-10-CM | POA: Diagnosis not present

## 2024-06-14 DIAGNOSIS — F429 Obsessive-compulsive disorder, unspecified: Secondary | ICD-10-CM

## 2024-06-14 DIAGNOSIS — F331 Major depressive disorder, recurrent, moderate: Secondary | ICD-10-CM | POA: Diagnosis not present

## 2024-06-14 NOTE — Progress Notes (Signed)
 [facility] Counselor/Therapist Progress Note  Patient ID: ANJEANETTE PETZOLD, MRN: [id number],    Date:  06/14/2024  Time Spent: 61 minutes  Time in:5:03 Time out: 6:04  Treatment Type:- The patient was seen via video visit.  She gave verbal consent for the session to be on video on caregility.  The patient was in her home alone and therapist was in the office.  Reported Symptoms: anxiety, panic attacks, sadness  Mental Status Exam: Appearance:  Casual     Behavior: Appropriate  Motor: Normal  Speech/Language:  Normal Rate  Affect: blunted  Mood: frustrated  Thought process: normal  Thought content:   WNL  Sensory/Perceptual disturbances:   WNL  Orientation: oriented to person, place, time/date, and situation  Attention: Good  Concentration: Good  Memory: WNL  Fund of knowledge:  Good  Insight:   Fair  Judgment:  Good  Impulse Control: Good   Risk Assessment: Danger to Self:  No Self-injurious Behavior: No Danger to Others: No Duty to Warn:no Physical Aggression / Violence:No  Access to Firearms a concern: No  Gang Involvement:No   Subjective: The patient was seen for an individual therapy session  via video visit today.  The patient gave verbal permission for the session to be on video on caregility and she is aware of the limitations of telehealth.  The patient was in her home alone and therapist was in the office.  The patient presents with a blunted affect and her mood is frustrated.   Patient reports that she is going to the beach tomorrow and she has a little anxiety about going somewhere away from home since she has had her stroke.  She was doing some what if thinking about what if I have a stroke.  I tried to reassure her that they have good facilities down at the beach and that she is going to be okay.  We also processed her feelings about what is going on with her sister.  She responded to her sister and apparently her sister came back with a lot of  attention seeking verbiage.  I did some education about borderline personality disorder with the patient and recommended that she just try not to necessarily help her sister see the logic but hide what her thoughts are.  Her sister is incomplete competition with her and she can say very hurtful things and I do not know that anything is going to change with her sister. Interventions: Cognitive Behavioral Therapy and Assertiveness/Communication,, problem solving, psychoeducation,  EMDR as indicated, Meditation and mindfulness,   Diagnosis:GAD (generalized anxiety disorder)  Obsessive-compulsive disorder, unspecified type  Major depressive disorder, recurrent episode, moderate (HCC)  Plan: Client Abilities/Strengths  Intelligent, insightful, motivated  Client Treatment Preferences  Outpatient Individual therapy every other week  Client Statement of Needs   I feel better now, but still need some help with anxiety  Treatment Level  Outpatient Individual therapy  Symptoms  Frustration and anxiety related to providing oversight and caretaking to an aging, ailing, and dependent  parent.: No Description Entered (Status: improved). Hypervigilance (e.g., feeling constantly on edge,  experiencing concentration difficulties, having trouble falling or staying asleep, exhibiting a general  state of irritability).: No Description Entered (Status: improved). Motor tension (e.g., restlessness,  tiredness, shakiness, muscle tension).: No Description Entered (Status: improved).  Problems Addressed  Anxiety, Phase Of Life Problems, Anxiety  Goals 1. Learn and implement coping skills that result in a reduction of anxiety  and worry, and improved daily functioning. Objective  Learn and implement calming skills to reduce overall anxiety and manage anxiety symptoms. Target Date: 2026-08-09Frequency: Weekly Progress: 40 Modality: individual  Related Interventions 1. Teach the client calming/relaxation  skills (e.g., applied relaxation, progressive muscle  relaxation, cue controlled relaxation; mindful breathing; biofeedback) and how to discriminate  better between relaxation and tension; teach the client how to apply these skills to his/her daily  life (e.g., New Directions in Progressive Muscle Relaxation by Thornell Collier, and  Hazlett-Stevens; Treating Generalized Anxiety Disorder by Rygh and Red). Objective Identify, challenge, and replace biased, fearful self-talk with positive, realistic, and empowering selftalk. Target Date: 2025-05-26 Frequency: weekly Progress: 30 Modality: individual Related Interventions 1. Explore the client's schema and self-talk that mediate his/her fear response; assist him/her in  challenging the biases; replace the distorted messages with reality-based alternatives and  positive, realistic self-talk that will increase his/her self-confidence in coping with irrational  fears (see Cognitive Therapy of Anxiety Disorders by Gretta armin Mon). Objective Learn and implement problem-solving strategies for realistically addressing worries. Target Date: 2026-08-09Frequency: weekly Progress: 40 Modality: individual 2. Resolve conflicted feelings and adapt to the new life circumstances. Objective Apply problem-solving skills to current circumstances. Target Date: 2025-05-26 Frequency: weekly Progress: 20 Modality: individual Related Interventions 1. Teach the client problem-resolution skills (e.g., defining the problem clearly, brainstorming  multiple solutions, listing the pros and cons of each solution, seeking input from others,  selecting and implementing a plan of action, evaluating outcome, and readjusting plan as  necessary).   3. Stabilize anxiety level while increasing ability to function on a daily  basis. Diagnosis F33.1  Major depressive disorder, moderate 300.02 (Generalized anxiety disorder) - Open - [Signifier: n/a]  Axis   none 309.28 (Adjustment disorder with mixed anxiety and depressed  mood) - Open - [Signifier: n/a]  Adjustment Disorder,  With Anxiety   Marital conflict  Major Depressive disorder, moderate  Conditions For Discharge Achievement of treatment goals and objectives.  The patient approved this plan.   Caroljean Monsivais G Mabeline Varas, LCSW

## 2024-06-19 ENCOUNTER — Other Ambulatory Visit: Payer: Self-pay | Admitting: Family Medicine

## 2024-06-19 ENCOUNTER — Other Ambulatory Visit: Payer: Self-pay | Admitting: Neurology

## 2024-06-19 ENCOUNTER — Other Ambulatory Visit: Payer: Self-pay

## 2024-06-20 ENCOUNTER — Other Ambulatory Visit: Payer: Self-pay

## 2024-06-20 ENCOUNTER — Encounter: Payer: Self-pay | Admitting: Physical Medicine & Rehabilitation

## 2024-06-20 NOTE — Telephone Encounter (Signed)
 Reviewed form. [employer] was asking for form completion at 05/24/24 visit which was with Dr Marlane at rehabilitation office. Chart shows his office has been managing patient's return to work status and has been increasing her hours back to work. I called Bernarda w/ [employer] back and LVM for her to call us  back. Was going to just let her know we have not been managing this for patient. I do not currently see a patient authorization on file for us  to talk in any further detail with Bernarda however.   I called the patient and LVM (ok per DPR) informing her of the form we received and suggested that if she speaks with Bernarda at the [facility] she could let her know that Dr Marlane was who she saw on 05/24/24. Certainly please call us  back if needed. Office # left in message.

## 2024-06-21 ENCOUNTER — Other Ambulatory Visit: Payer: Self-pay

## 2024-06-21 ENCOUNTER — Telehealth: Payer: Self-pay | Admitting: Neurology

## 2024-06-21 DIAGNOSIS — G43009 Migraine without aura, not intractable, without status migrainosus: Secondary | ICD-10-CM

## 2024-06-21 NOTE — Telephone Encounter (Signed)
 I called pt and she has been taking samples of nurtec and ubrelvy  prn acute migraines.  Given to her by Dr. Ines.   Pt would like ubrelvy  (needs prescription).

## 2024-06-21 NOTE — Telephone Encounter (Signed)
 Pt reports she was called re: if she takes  Ubrogepant  (UBRELVY ) 100 MG TABS .  Pt states Dr Ines has her on this medication, pt states a new Rx is needed from Dr Ines for this medication, please call pt to discuss.

## 2024-06-22 ENCOUNTER — Other Ambulatory Visit: Payer: Self-pay

## 2024-06-22 ENCOUNTER — Other Ambulatory Visit (HOSPITAL_COMMUNITY): Payer: Self-pay

## 2024-06-22 ENCOUNTER — Telehealth: Payer: Self-pay

## 2024-06-22 NOTE — Telephone Encounter (Signed)
 Pharmacy Patient Advocate Encounter   Received notification from Physician's Office that prior authorization for Ubrelvy  is required/requested.   Insurance verification completed.   The patient is insured through [facility] .   Per test claim: The current 30 day co-pay is, $0.  No PA needed at this time. This test claim was processed through [facility]- copay amounts may vary at other pharmacies due to pharmacy/plan contracts, or as the patient moves through the different stages of their insurance plan.

## 2024-06-22 NOTE — Progress Notes (Signed)
 06/22/24 ALL: Faith Jordan returns for Botox . She is doing fairly well. May be having more headaches over the past 12 weeks. She has returned to work part time. She seems to be doing well. No hemiplegic or stroke like symptoms. She continues close follow up with PCP and Dr Carilyn. She was seen by Dr Lanny 05/2024 who is repeating TCD and pulmonary angiography to evaluate extracardiac shunt. Lipitor  decreased to 5mg  as LDL was 50 .  03/21/2024 ALL: Faith Jordan presents for Botox . She continues Emgality  montly and tolerating it well. She is having regular headache days but reports migraines have remained well controlled. She has had one event since stroke 12/2023 where she had left hemiplegic symptoms with slurred speech. No new intracranial abnormalities. Migraine cocktail relieved symptoms. She is scheduled to see Dr Lanny with [facility] at the end of June.     Consent Form Botulism Toxin Injection For Chronic Migraine    Reviewed orally with patient, additionally signature is on file:  Botulism toxin has been approved by the Federal drug administration for treatment of chronic migraine. Botulism toxin does not cure chronic migraine and it may not be effective in some patients.  The administration of botulism toxin is accomplished by injecting a small amount of toxin into the muscles of the neck and head. Dosage must be titrated for each individual. Any benefits resulting from botulism toxin tend to wear off after 3 months with a repeat injection required if benefit is to be maintained. Injections are usually done every 3-4 months with maximum effect peak achieved by about 2 or 3 weeks. Botulism toxin is expensive and you should be sure of what costs you will incur resulting from the injection.  The side effects of botulism toxin use for chronic migraine may include:   -Transient, and usually mild, facial weakness with facial injections  -Transient, and usually mild, head or neck weakness with  head/neck injections  -Reduction or loss of forehead facial animation due to forehead muscle weakness  -Eyelid drooping  -Dry eye  -Pain at the site of injection or bruising at the site of injection  -Double vision  -Potential unknown long term risks   Contraindications: You should not have Botox  if you are pregnant, nursing, allergic to albumin, have an infection, skin condition, or muscle weakness at the site of the injection, or have myasthenia gravis, Lambert-Eaton syndrome, or ALS.  It is also possible that as with any injection, there may be an allergic reaction or no effect from the medication. Reduced effectiveness after repeated injections is sometimes seen and rarely infection at the injection site may occur. All care will be taken to prevent these side effects. If therapy is given over a long time, atrophy and wasting in the muscle injected may occur. Occasionally the patient's become refractory to treatment because they develop antibodies to the toxin. In this event, therapy needs to be modified.  I have read the above information and consent to the administration of botulism toxin.    BOTOX  PROCEDURE NOTE FOR MIGRAINE HEADACHE  Contraindications and precautions discussed with patient(above). Aseptic procedure was observed and patient tolerated procedure. Procedure performed by Greig Forbes, FNP-C.   The condition has existed for more than 6 months, and pt does not have a diagnosis of ALS, Myasthenia Gravis or Lambert-Eaton Syndrome.  Risks and benefits of injections discussed and pt agrees to proceed with the procedure.  Written consent obtained  These injections are medically necessary. Pt  receives good benefits from  these injections. These injections do not cause sedations or hallucinations which the oral therapies may cause.   Description of procedure:  The patient was placed in a sitting position. The standard protocol was used for Botox  as follows, with 5 units of Botox   injected at each site:  -Procerus muscle, midline injection  -Corrugator muscle, bilateral injection  -Frontalis muscle, bilateral injection, with 2 sites each side, medial injection was performed in the upper one third of the frontalis muscle, in the region vertical from the medial inferior edge of the superior orbital rim. The lateral injection was again in the upper one third of the forehead vertically above the lateral limbus of the cornea, 1.5 cm lateral to the medial injection site.  -Temporalis muscle injection, 4 sites, bilaterally. The first injection was 3 cm above the tragus of the ear, second injection site was 1.5 cm to 3 cm up from the first injection site in line with the tragus of the ear. The third injection site was 1.5-3 cm forward between the first 2 injection sites. The fourth injection site was 1.5 cm posterior to the second injection site. 5th site laterally in the temporalis  muscleat the level of the outer canthus.  -Occipitalis muscle injection, 3 sites, bilaterally. The first injection was done one half way between the occipital protuberance and the tip of the mastoid process behind the ear. The second injection site was done lateral and superior to the first, 1 fingerbreadth from the first injection. The third injection site was 1 fingerbreadth superiorly and medially from the first injection site.  -Cervical paraspinal muscle injection, 2 sites, bilaterally. The first injection site was 1 cm from the midline of the cervical spine, 3 cm inferior to the lower border of the occipital protuberance. The second injection site was 1.5 cm superiorly and laterally to the first injection site.  -Trapezius muscle injection was performed at 3 sites, bilaterally. The first injection site was in the upper trapezius muscle halfway between the inflection point of the neck, and the acromion. The second injection site was one half way between the acromion and the first injection site. The  third injection was done between the first injection site and the inflection point of the neck.  -masseter muscle injections bilaterally    Will return for repeat injection in 3 months.   A total of 200 units of Botox  was prepared, 165 units of Botox  was injected as documented above, any Botox  not injected was wasted. The patient tolerated the procedure well, there were no complications of the above procedure.

## 2024-06-23 ENCOUNTER — Other Ambulatory Visit: Payer: Self-pay

## 2024-06-24 ENCOUNTER — Other Ambulatory Visit: Payer: Self-pay

## 2024-06-25 ENCOUNTER — Other Ambulatory Visit: Payer: Self-pay

## 2024-06-25 ENCOUNTER — Ambulatory Visit (INDEPENDENT_AMBULATORY_CARE_PROVIDER_SITE_OTHER): Admitting: Family Medicine

## 2024-06-25 ENCOUNTER — Encounter: Payer: Self-pay | Admitting: Family Medicine

## 2024-06-25 VITALS — BP 118/72 | HR 83

## 2024-06-25 DIAGNOSIS — G43709 Chronic migraine without aura, not intractable, without status migrainosus: Secondary | ICD-10-CM

## 2024-06-25 MED ORDER — UBRELVY 100 MG PO TABS
ORAL_TABLET | ORAL | 11 refills | Status: AC
  Filled 2024-06-25: qty 16, 30d supply, fill #0
  Filled 2024-10-07: qty 16, 30d supply, fill #1

## 2024-06-25 MED ORDER — ONABOTULINUMTOXINA 200 UNITS IJ SOLR
155.0000 [IU] | Freq: Once | INTRAMUSCULAR | Status: AC
  Administered 2024-06-25: 165 [IU] via INTRAMUSCULAR

## 2024-06-25 NOTE — Progress Notes (Signed)
 Botox - 200 units x 1 vial Lot: [id number] Expiration:08/2026 NDC: 0023-3921-02  Bacteriostatic 0.9% Sodium Chloride - 30 mL  Lot: [id number] Expiration: OCT-31-2026 NDC: 9593-8033-97  Dx: H56.290 S/P Witnessed by: Maurilio Molt, RN

## 2024-06-25 NOTE — Telephone Encounter (Signed)
 Please let patient know:ubrelvy  prescribed and:  Per test claim: The current 30 day co-pay is, $0.  No PA needed at this time. This test claim was processed through [facility]- copay amounts may vary at other pharmacies due to pharmacy/plan contracts, or as the patient moves through the different stages of their insurance pla

## 2024-06-26 ENCOUNTER — Ambulatory Visit (INDEPENDENT_AMBULATORY_CARE_PROVIDER_SITE_OTHER): Admitting: Psychology

## 2024-06-26 ENCOUNTER — Ambulatory Visit
Admission: RE | Admit: 2024-06-26 | Discharge: 2024-06-26 | Disposition: A | Discharge status: Home / Self Care | Source: Ambulatory Visit | Attending: Primary Care | Admitting: Primary Care

## 2024-06-26 DIAGNOSIS — F331 Major depressive disorder, recurrent, moderate: Secondary | ICD-10-CM | POA: Diagnosis not present

## 2024-06-26 DIAGNOSIS — F429 Obsessive-compulsive disorder, unspecified: Secondary | ICD-10-CM

## 2024-06-26 DIAGNOSIS — Z8673 Personal history of transient ischemic attack (TIA), and cerebral infarction without residual deficits: Secondary | ICD-10-CM | POA: Diagnosis not present

## 2024-06-26 DIAGNOSIS — R110 Nausea: Secondary | ICD-10-CM | POA: Insufficient documentation

## 2024-06-26 DIAGNOSIS — R634 Abnormal weight loss: Secondary | ICD-10-CM | POA: Diagnosis not present

## 2024-06-26 DIAGNOSIS — R109 Unspecified abdominal pain: Secondary | ICD-10-CM | POA: Diagnosis not present

## 2024-06-26 DIAGNOSIS — F411 Generalized anxiety disorder: Secondary | ICD-10-CM

## 2024-06-26 MED ORDER — TECHNETIUM TC 99M MEBROFENIN IV KIT
5.0000 | PACK | Freq: Once | INTRAVENOUS | Status: AC | PRN
  Administered 2024-06-26: 5.35 via INTRAVENOUS

## 2024-06-26 NOTE — Telephone Encounter (Signed)
 Pt aware of Ubrevly  approval

## 2024-06-26 NOTE — Progress Notes (Signed)
 [facility] Counselor/Therapist Progress Note  Patient ID: Faith Jordan, MRN: [id number],    Date:  06/26/2024  Time Spent: 54 minutes  Time in:9:04 Time out: 9:58  Treatment Type:- The patient was seen via video visit.  She gave verbal consent for the session to be on video on caregility.  The patient was in her home alone and therapist was in the office.  Reported Symptoms: anxiety, panic attacks, sadness  Mental Status Exam: Appearance:  Casual     Behavior: Appropriate  Motor: Normal  Speech/Language:  Normal Rate  Affect: blunted  Mood: frustrated  Thought process: normal  Thought content:   WNL  Sensory/Perceptual disturbances:   WNL  Orientation: oriented to person, place, time/date, and situation  Attention: Good  Concentration: Good  Memory: WNL  Fund of knowledge:  Good  Insight:   Fair  Judgment:  Good  Impulse Control: Good   Risk Assessment: Danger to Self:  No Self-injurious Behavior: No Danger to Others: No Duty to Warn:no Physical Aggression / Violence:No  Access to Firearms a concern: No  Gang Involvement:No   Subjective: The patient was seen for an individual therapy session  via video visit today.  The patient gave verbal permission for the session to be on video on caregility and she is aware of the limitations of telehealth.  The patient was in her home alone and therapist was in the office.  The patient presents with a blunted affect and her mood is frustrated.  The patient reports that she went to the beach in had some difficulty and that she fell down while she was on the beach and had to be helped up.  She is really struggling with being 28 and having a stroke.  We talked about how that could have actually happened to anyone and that she is going to have to get to a place where she deals with things differently than she used to deal with some because there are going to be things that happen that are different than they were before.  In  addition she was talking about going to lunch at her parents house and her sister was there and apparently she felt pressure from her parents to interact with her sister and she did not want to really interact with her.  We talked about how she went to the dinner and that that was probably good enough and that she probably handled the situation the best she could have handled it.  She reports that her sister was at her office yesterday and she did not exactly know how to respond and I explained to her that she needs to be just kind and put situations back where they belong and if it work it goes back to the mentor if she has questions or if it is with her parents that she explains to them that she is not going to be able to work things out with her sister and that she will do the best she can to be cordial and kind but that she does not feel like she is going to be able to develop a long-term relationship with her sister as they have difficulty themselves.  We also talked briefly about her sadness and the doctors feel that some of her stomach issues are probably related to her depression.  She has seen Gina Mozingo recently and her medications were increased.  She reports no real change in her mood.  The patient denies any suicidal thoughts and  denies that any of her problems are related to her sadness.  Interventions: Cognitive Behavioral Therapy and Assertiveness/Communication,, problem solving, psychoeducation,  EMDR as indicated, Meditation and mindfulness,   Diagnosis:GAD (generalized anxiety disorder)  Obsessive-compulsive disorder, unspecified type  Major depressive disorder, recurrent episode, moderate (HCC)  Plan: Client Abilities/Strengths  Intelligent, insightful, motivated  Client Treatment Preferences  Outpatient Individual therapy every other week  Client Statement of Needs   I feel better now, but still need some help with anxiety  Treatment Level  Outpatient Individual therapy   Symptoms  Frustration and anxiety related to providing oversight and caretaking to an aging, ailing, and dependent  parent.: No Description Entered (Status: improved). Hypervigilance (e.g., feeling constantly on edge,  experiencing concentration difficulties, having trouble falling or staying asleep, exhibiting a general  state of irritability).: No Description Entered (Status: improved). Motor tension (e.g., restlessness,  tiredness, shakiness, muscle tension).: No Description Entered (Status: improved).  Problems Addressed  Anxiety, Phase Of Life Problems, Anxiety  Goals 1. Learn and implement coping skills that result in a reduction of anxiety  and worry, and improved daily functioning. Objective Learn and implement calming skills to reduce overall anxiety and manage anxiety symptoms. Target Date: 2026-08-09Frequency: Weekly Progress: 40 Modality: individual  Related Interventions 1. Teach the client calming/relaxation skills (e.g., applied relaxation, progressive muscle  relaxation, cue controlled relaxation; mindful breathing; biofeedback) and how to discriminate  better between relaxation and tension; teach the client how to apply these skills to his/her daily  life (e.g., New Directions in Progressive Muscle Relaxation by Thornell Collier, and  Hazlett-Stevens; Treating Generalized Anxiety Disorder by Rygh and Red). Objective Identify, challenge, and replace biased, fearful self-talk with positive, realistic, and empowering selftalk. Target Date: 2025-05-26 Frequency: weekly Progress: 30 Modality: individual Related Interventions 1. Explore the client's schema and self-talk that mediate his/her fear response; assist him/her in  challenging the biases; replace the distorted messages with reality-based alternatives and  positive, realistic self-talk that will increase his/her self-confidence in coping with irrational  fears (see Cognitive Therapy of Anxiety Disorders by  Gretta armin Mon). Objective Learn and implement problem-solving strategies for realistically addressing worries. Target Date: 2026-08-09Frequency: weekly Progress: 40 Modality: individual 2. Resolve conflicted feelings and adapt to the new life circumstances. Objective Apply problem-solving skills to current circumstances. Target Date: 2025-05-26 Frequency: weekly Progress: 20 Modality: individual Related Interventions 1. Teach the client problem-resolution skills (e.g., defining the problem clearly, brainstorming  multiple solutions, listing the pros and cons of each solution, seeking input from others,  selecting and implementing a plan of action, evaluating outcome, and readjusting plan as  necessary).   3. Stabilize anxiety level while increasing ability to function on a daily  basis. Diagnosis F33.1  Major depressive disorder, moderate 300.02 (Generalized anxiety disorder) - Open - [Signifier: n/a]  Axis  none 309.28 (Adjustment disorder with mixed anxiety and depressed  mood) - Open - [Signifier: n/a]  Adjustment Disorder,  With Anxiety   Marital conflict  Major Depressive disorder, moderate  Conditions For Discharge Achievement of treatment goals and objectives.  The patient approved this plan.   Rechy Bost G Kanaan Kagawa, LCSW

## 2024-06-27 ENCOUNTER — Ambulatory Visit: Payer: Self-pay | Admitting: Primary Care

## 2024-06-27 DIAGNOSIS — R634 Abnormal weight loss: Secondary | ICD-10-CM

## 2024-06-27 DIAGNOSIS — R110 Nausea: Secondary | ICD-10-CM

## 2024-06-28 DIAGNOSIS — G909 Disorder of the autonomic nervous system, unspecified: Secondary | ICD-10-CM | POA: Diagnosis not present

## 2024-06-28 DIAGNOSIS — I639 Cerebral infarction, unspecified: Secondary | ICD-10-CM | POA: Diagnosis not present

## 2024-07-03 ENCOUNTER — Encounter: Attending: Physical Medicine & Rehabilitation | Admitting: Physical Medicine & Rehabilitation

## 2024-07-03 ENCOUNTER — Encounter: Payer: Self-pay | Admitting: Physical Medicine & Rehabilitation

## 2024-07-03 VITALS — BP 105/71 | HR 71 | Ht 67.0 in | Wt 141.0 lb

## 2024-07-03 DIAGNOSIS — I6381 Other cerebral infarction due to occlusion or stenosis of small artery: Secondary | ICD-10-CM | POA: Diagnosis not present

## 2024-07-03 NOTE — Patient Instructions (Signed)
 Lipoproteins Test- your new test is Lipoprotein A  Why am I having this test? Lipoproteins are substances that move cholesterol and fats through the bloodstream. You may have the lipoproteins test to evaluate your risk for developing heart disease. What is being tested? This test measures lipoproteins in your blood. Lipoproteins transport cholesterol, triglycerides, and other fats to and from your tissues and your liver. There are three main types of lipoproteins: High-density lipoproteins (HDL). This is sometimes called good cholesterol. Having high levels of HDL decreases your risk for heart disease. Low-density lipoproteins (LDL). This is sometimes called bad cholesterol. Having high levels of LDL increases your risk for heart disease. Very low-density lipoproteins (VLDL). A high level of VLDL also increases your risk for heart disease. The lipoproteins test may measure one, two, or all three of these lipoproteins. When all types of lipoproteins are measured together, the test is called a lipid profile test. What kind of sample is taken?  A blood sample is required for this test. It is usually collected by inserting a needle into a blood vessel or by sticking a finger with a small needle. How do I prepare for this test? Follow instructions from your health care provider about changing or stopping your regular medicines. Do not eat or drink anything except water starting 9-12 hours before your test, or as long as told by your health care provider. Do not drink alcohol starting at least 24 hours before your test. Follow any instructions from your health care provider about dietary restrictions before your test. Tell a health care provider about: Any allergies you have. All medicines you are taking, including vitamins, herbs, eye drops, creams, and over-the-counter medicines. Any bleeding problems you have. Any surgeries you have had. Any medical conditions you have. Whether you are pregnant  or may be pregnant. How are the results reported? Your test results will be reported as values that indicate how much of each lipoprotein is in your blood. This is usually given as milligrams of lipoprotein per deciliter of blood (mg/dL). Your health care provider will compare your results to normal ranges that were established after testing a large group of people (reference ranges). Reference ranges may vary among labs and hospitals. For this test, common reference ranges are: HDL: Female: greater than 45 mg/dL. Female: greater than 55 mg/dL. LDL: Adult: less than 130 mg/dL. Children: less than 110 mg/dL. VLDL: 7-32 mg/dL. The VLDL result may also be given as a percentage of total cholesterol. VLDL levels that are higher than 25-50% of total cholesterol are associated with an increased risk of heart disease. What do the results mean? Your HDL results will be used to describe your risk for heart disease as low, moderate, or high. You are at low risk for heart disease if your HDL result is: Men: greater than 60 mg/dL. Women: greater than 70 mg/dL. You are at moderate risk for heart disease if your HDL result is: Men: 45-59 mg/dL. Women: 55-69 mg/dL. You are at high risk for heart disease if your HDL result is: Men: 25-44 mg/dL. Women: 35-54 mg/dL. Having a low level of HDL along with an LDL level that is higher than 130 mg/dL also increases your risk for heart disease. Based on your risk for heart disease, your health care provider will determine a target LDL level for you. If you are at low risk, your LDL should be 130 mg/dL or less. If you are at moderate risk, your LDL should be 100 mg/dL or less.  If you are at high risk, your LDL should be less than 70 mg/dL. A VLDL level that is higher than 32 mg/dL or above 74-49% of total cholesterol increases your risk for heart disease. You may need to make lifestyle changes or take medicines to lower your LDL cholesterol and decrease your risk for  heart disease. Talk with your health care provider about your individual risk and what your results mean. Questions to ask your health care provider Ask your health care provider, or the department that is doing the test: When will my results be ready? How will I get my results? What are my treatment options? What other tests do I need? What are my next steps? Summary Lipoproteins are substances that move cholesterol and fats through your bloodstream. The lipoproteins test helps evaluate your risk for developing heart disease. There are three main types of lipoproteins: high-density lipoproteins (HDL), low-density lipoproteins (LDL), and very low-density lipoproteins (VLDL). Having a high level of HDL and low levels of LDL and VLDL decreases your risk for heart disease. Talk with your health care provider about what your results mean. This information is not intended to replace advice given to you by your health care provider. Make sure you discuss any questions you have with your health care provider. Document Revised: 01/14/2021 Document Reviewed: 01/14/2021 Elsevier Patient Education  2024 [employer].

## 2024-07-03 NOTE — Progress Notes (Signed)
 Subjective:    Patient ID: Faith Jordan, female    DOB: 1996-06-16, 28 y.o.   MRN: [id number]  HPI  28 year old female with history of right basal ganglia infarct returns today to discuss work schedule.  She has followed up with [facility] Department of neurology, Dr. Cora ordered a TCD which was negative.  She sees a neurologist specializing in neurodegenerative diseases.  This is to evaluate if calcifications in the basal ganglia area are due to a congenital neurodegenerative disorder.  The patient does have a migraine history.  She has had normal lipid studies.  We discussed another lipoprotein specifically lipoprotein a .  I reviewed epic and there was results for this test specifically. Pain Inventory Average Pain 0 Pain Right Now 0 My pain is no pain  In the last 24 hours, has pain interfered with the following? General activity 0 Relation with others 0 Enjoyment of life 0 What TIME of day is your pain at its worst? NA Sleep (in general) Good  Pain is worse with: no pain Pain improves with: no pain Relief from Meds: no pain  Family History  Problem Relation Age of Onset   Depression Mother    Migraines Mother    Narcolepsy Mother    Hypothyroidism Mother    Breast cancer Maternal Grandmother 19   Myasthenia gravis Maternal Grandfather    Asthma Other    Cancer Other    Epilepsy Other    Allergic rhinitis Neg Hx    Angioedema Neg Hx    Atopy Neg Hx    Eczema Neg Hx    Immunodeficiency Neg Hx    Urticaria Neg Hx    Stroke Neg Hx    Social History   Socioeconomic History   Marital status: Significant Other    Spouse name: Not on file   Number of children: Not on file   Years of education: Not on file   Highest education level: Bachelor's degree (e.g., BA, AB, BS)  Occupational History   Not on file  Tobacco Use   Smoking status: Never   Smokeless tobacco: Never  Vaping Use   Vaping status: Never Used  Substance and Sexual Activity    Alcohol use: No   Drug use: No   Sexual activity: Yes  Other Topics Concern   Not on file  Social History Narrative   Lives with fiance   R handed   Caffeine : 1 drink a day   Social Drivers of Corporate investment banker Strain: Not on file  Food Insecurity: No Food Insecurity (01/09/2024)   Hunger Vital Sign    Worried About Running Out of Food in the Last Year: Never true    Ran Out of Food in the Last Year: Never true  Transportation Needs: No Transportation Needs (01/09/2024)   PRAPARE - Administrator, Civil Service (Medical): No    Lack of Transportation (Non-Medical): No  Physical Activity: Not on file  Stress: Not on file  Social Connections: Not on file   Past Surgical History:  Procedure Laterality Date   ADENOIDECTOMY     BREAST LUMPECTOMY Right    BREAST LUMPECTOMY Right    MASS EXCISION Right 10/07/2014   Procedure: EXCISION OF RIGHT BREAST MASS;  Surgeon: Krystal Russell, MD;  Location: [facility] OR;  Service: General;  Laterality: Right;   TONSILLECTOMY AND ADENOIDECTOMY     TOOTH EXTRACTION     TRANSESOPHAGEAL ECHOCARDIOGRAM (CATH LAB) N/A 01/10/2024  Procedure: TRANSESOPHAGEAL ECHOCARDIOGRAM;  Surgeon: Jeffrie Oneil BROCKS, MD;  Location: [facility] INVASIVE CV LAB;  Service: Cardiovascular;  Laterality: N/A;   Past Surgical History:  Procedure Laterality Date   ADENOIDECTOMY     BREAST LUMPECTOMY Right    BREAST LUMPECTOMY Right    MASS EXCISION Right 10/07/2014   Procedure: EXCISION OF RIGHT BREAST MASS;  Surgeon: Krystal Russell, MD;  Location: [facility] OR;  Service: General;  Laterality: Right;   TONSILLECTOMY AND ADENOIDECTOMY     TOOTH EXTRACTION     TRANSESOPHAGEAL ECHOCARDIOGRAM (CATH LAB) N/A 01/10/2024   Procedure: TRANSESOPHAGEAL ECHOCARDIOGRAM;  Surgeon: Jeffrie Oneil BROCKS, MD;  Location: [facility] INVASIVE CV LAB;  Service: Cardiovascular;  Laterality: N/A;   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   Acute flank pain  06/02/2022   Angio-edema 04/27/2016   Angioedema    Aquagenic angio-edema-urticaria    Asthma    no problems /not used recently   Dysautonomia [facility])    Dysrhythmia    sinus tachycardia   Fibroid    right breast, adenoma   Headache(784.0)    History of COVID-19 11/21/2020   Hives 12/22/2015   Pneumonia    hx  6th grade   Sensation of fullness in both ears 02/02/2023   Snoring 02/11/2022   Urinary frequency 02/12/2019   Vaginal yeast infection 06/02/2022   Vasculitis (HCC)    BP 105/71   Pulse 71   Ht 5' 7 (1.702 m)   Wt 141 lb (64 kg)   LMP 06/11/2024   SpO2 98%   BMI 22.08 kg/m   Opioid Risk Score:   Fall Risk Score:  `1  Depression screen [facility] 2/9     07/03/2024   10:57 AM 05/24/2024   10:37 AM 02/10/2024    1:44 PM 02/08/2024    8:36 AM 11/29/2023    8:08 AM 06/10/2022    2:28 PM 06/02/2022    8:27 AM  Depression screen PHQ 2/9  Decreased Interest 0 2 2 1  0  0  Down, Depressed, Hopeless 0 2 2 2  0  0  PHQ - 2 Score 0 4 4 3  0  0  Altered sleeping   2 0 0  0  Tired, decreased energy   2 2 0  3  Change in appetite   2 0 0  0  Feeling bad or failure about yourself    2 0 0  0  Trouble concentrating   0 0 0  0  Moving slowly or fidgety/restless   0 2 0  0  Suicidal thoughts   0 0 0  0  PHQ-9 Score   12 7 0  3  Difficult doing work/chores   Very difficult Not difficult at all Not difficult at all  Not difficult at all     Information is confidential and restricted. Go to Review Flowsheets to unlock data.      Review of Systems  All other systems reviewed and are negative.      Objective:   Physical Exam General No acute distress Mood affect appropriate Extremities without edema Motor strength is 5/5 in the right deltoid, bicep, tricep, grip, hip flexor, knee extensor, ankle dorsiflexor and plantar flexor Motor strength on the left side is 4+ in the deltoid bicep tricep grip hip flexor knee extensor ankle dorsiflexion and plantarflexion Ambulates without  assistive device no evidence toe drag or knee instability she has some difficulty with tandem  gait.  She is able to toe walk and heel walk however. Sensation intact light touch bilateral upper and lower limbs Finger thumb opposition is intact bilaterally No evidence of dysdiadochokinesis with rapid alternating supination pronation bilateral extremities Finger-nose-finger testing shows mild dysmetria left upper extremity.       Assessment & Plan:   #1.  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  She also has mild balance disorder.  She has been tolerating a gradual return to work schedule.  She is tolerating 3 x8 hours workdays per week.  I think she may go up to 12 hours 3 days a week which is essentially her normal schedule.  She would do better without back-to-back shifts.  She still has a 35 pound lifting restriction.  I would like to see her back in 3 months to reassess or sooner if she has any issues at work. 2.  Cryptogenic stroke, ordered lipoprotein a.  If abnormal may need to see cardiology again to see if niacin therapy may be helpful. She is following up with vascular neurology as well as neuro degenerative disease specialist at [facility]

## 2024-07-04 ENCOUNTER — Other Ambulatory Visit: Payer: Self-pay

## 2024-07-04 ENCOUNTER — Ambulatory Visit: Admitting: Psychology

## 2024-07-04 LAB — LIPOPROTEIN A (LPA): Lipoprotein (a): 8.7 nmol/L (ref <75.0)

## 2024-07-05 ENCOUNTER — Telehealth: Payer: Self-pay | Admitting: Neurology

## 2024-07-05 NOTE — Telephone Encounter (Signed)
 MYC c an

## 2024-07-09 ENCOUNTER — Telehealth: Payer: Self-pay | Admitting: Neurology

## 2024-07-09 DIAGNOSIS — G9389 Other specified disorders of brain: Secondary | ICD-10-CM | POA: Diagnosis not present

## 2024-07-09 NOTE — Telephone Encounter (Signed)
 Pt called and LVM wanting to know when she can get r/s. Pt was to be seen in 12 weeks and there is nothing near that time frame available. Please advise when we can get pt in to the office in a timely manner.

## 2024-07-10 ENCOUNTER — Ambulatory Visit (INDEPENDENT_AMBULATORY_CARE_PROVIDER_SITE_OTHER): Admitting: Psychology

## 2024-07-10 ENCOUNTER — Ambulatory Visit: Admitting: Neurology

## 2024-07-10 DIAGNOSIS — F330 Major depressive disorder, recurrent, mild: Secondary | ICD-10-CM

## 2024-07-10 DIAGNOSIS — F429 Obsessive-compulsive disorder, unspecified: Secondary | ICD-10-CM

## 2024-07-10 DIAGNOSIS — F411 Generalized anxiety disorder: Secondary | ICD-10-CM | POA: Diagnosis not present

## 2024-07-10 NOTE — Progress Notes (Unsigned)
 [facility] Counselor/Therapist Progress Note  Patient ID: Faith Jordan, MRN: [id number],    Date:  07/10/2024  Time Spent: 51 minutes  Time in:10:04 Time out: 10:55  Treatment Type:- The patient was seen via video visit.  She gave verbal consent for the session to be on video on caregility.  The patient was in her home alone and therapist was in the office.  Reported Symptoms: anxiety, panic attacks, sadness  Mental Status Exam: Appearance:  Casual     Behavior: Appropriate  Motor: Normal  Speech/Language:  Normal Rate  Affect: blunted  Mood: pleasant  Thought process: normal  Thought content:   WNL  Sensory/Perceptual disturbances:   WNL  Orientation: oriented to person, place, time/date, and situation  Attention: Good  Concentration: Good  Memory: WNL  Fund of knowledge:  Good  Insight:   Fair  Judgment:  Good  Impulse Control: Good   Risk Assessment: Danger to Self:  No Self-injurious Behavior: No Danger to Others: No Duty to Warn:no Physical Aggression / Violence:No  Access to Firearms a concern: No  Gang Involvement:No   Subjective: The patient was seen for an individual therapy session  via video visit today.  The patient gave verbal permission for the session to be on video on caregility and she is aware of the limitations of telehealth.  The patient was in her home alone and therapist was in the office.  The patient presents with a blunted affect and her mood is pleasant.  The patient reports that work has been a little more difficult because her sister has been there.  She states that she has been very careful not to tell people that Sabra is her sister.  We talked about the need to have good clear boundaries at work and to be very careful and mindful about how she interacts with her sister because of the conflict of interest issue.  The patient also talked about having gone to the doctor and it does not seem that they are too concerned about her  having Fahr's disease right now and she does not seem to be as stressed about that.  She did report that she had a conversation with her father and she was talking about how if she had another stroke that she might have to move in with them and she did not feel well supported by her father about that.  We talked about the possibility of her getting long-term care insurance so that if she needed some help  in the future she could potentially pay for it.  She does seem to be doing some obsessing about the possibility that she could have another stroke and that may be what her father's frustration is about.  Interventions: Cognitive Behavioral Therapy and Assertiveness/Communication,, problem solving, psychoeducation,  EMDR as indicated, Meditation and mindfulness,   Diagnosis:GAD (generalized anxiety disorder)  Obsessive-compulsive disorder, unspecified type  Major depressive disorder, recurrent episode, mild  Plan: Client Abilities/Strengths  Intelligent, insightful, motivated  Client Treatment Preferences  Outpatient Individual therapy every other week  Client Statement of Needs   I feel better now, but still need some help with anxiety  Treatment Level  Outpatient Individual therapy  Symptoms  Frustration and anxiety related to providing oversight and caretaking to an aging, ailing, and dependent  parent.: No Description Entered (Status: improved). Hypervigilance (e.g., feeling constantly on edge,  experiencing concentration difficulties, having trouble falling or staying asleep, exhibiting a general  state of irritability).: No Description Entered (Status: improved).  Motor tension (e.g., restlessness,  tiredness, shakiness, muscle tension).: No Description Entered (Status: improved).  Problems Addressed  Anxiety, Phase Of Life Problems, Anxiety  Goals 1. Learn and implement coping skills that result in a reduction of anxiety  and worry, and improved daily  functioning. Objective Learn and implement calming skills to reduce overall anxiety and manage anxiety symptoms. Target Date: 2026-08-09Frequency: Weekly Progress: 40 Modality: individual  Related Interventions 1. Teach the client calming/relaxation skills (e.g., applied relaxation, progressive muscle  relaxation, cue controlled relaxation; mindful breathing; biofeedback) and how to discriminate  better between relaxation and tension; teach the client how to apply these skills to his/her daily  life (e.g., New Directions in Progressive Muscle Relaxation by Thornell Collier, and  Hazlett-Stevens; Treating Generalized Anxiety Disorder by Rygh and Red). Objective Identify, challenge, and replace biased, fearful self-talk with positive, realistic, and empowering selftalk. Target Date: 2025-05-26 Frequency: weekly Progress: 30 Modality: individual Related Interventions 1. Explore the client's schema and self-talk that mediate his/her fear response; assist him/her in  challenging the biases; replace the distorted messages with reality-based alternatives and  positive, realistic self-talk that will increase his/her self-confidence in coping with irrational  fears (see Cognitive Therapy of Anxiety Disorders by Gretta armin Mon). Objective Learn and implement problem-solving strategies for realistically addressing worries. Target Date: 2026-08-09Frequency: weekly Progress: 40 Modality: individual 2. Resolve conflicted feelings and adapt to the new life circumstances. Objective Apply problem-solving skills to current circumstances. Target Date: 2025-05-26 Frequency: weekly Progress: 20 Modality: individual Related Interventions 1. Teach the client problem-resolution skills (e.g., defining the problem clearly, brainstorming  multiple solutions, listing the pros and cons of each solution, seeking input from others,  selecting and implementing a plan of action, evaluating outcome, and  readjusting plan as  necessary).   3. Stabilize anxiety level while increasing ability to function on a daily  basis. Diagnosis F33.1  Major depressive disorder, moderate 300.02 (Generalized anxiety disorder) - Open - [Signifier: n/a]  Axis  none 309.28 (Adjustment disorder with mixed anxiety and depressed  mood) - Open - [Signifier: n/a]  Adjustment Disorder,  With Anxiety   Marital conflict  Major Depressive disorder, moderate  Conditions For Discharge Achievement of treatment goals and objectives.  The patient approved this plan.   Abijah Roussel G Dehlia Kilner, LCSW

## 2024-07-12 ENCOUNTER — Encounter: Admitting: Physical Medicine & Rehabilitation

## 2024-07-17 ENCOUNTER — Encounter: Payer: Self-pay | Admitting: Physical Medicine & Rehabilitation

## 2024-07-17 ENCOUNTER — Ambulatory Visit: Payer: Self-pay | Admitting: *Deleted

## 2024-07-18 ENCOUNTER — Encounter: Payer: Self-pay | Admitting: Family Medicine

## 2024-07-18 ENCOUNTER — Other Ambulatory Visit: Payer: Self-pay

## 2024-07-18 ENCOUNTER — Other Ambulatory Visit: Payer: Self-pay | Admitting: Family Medicine

## 2024-07-18 ENCOUNTER — Other Ambulatory Visit: Payer: Self-pay | Admitting: Adult Health

## 2024-07-18 DIAGNOSIS — G479 Sleep disorder, unspecified: Secondary | ICD-10-CM

## 2024-07-18 MED ORDER — EMGALITY 120 MG/ML ~~LOC~~ SOAJ
120.0000 mg | SUBCUTANEOUS | 0 refills | Status: DC
  Filled 2024-07-18: qty 3, 90d supply, fill #0

## 2024-07-18 MED ORDER — TRAZODONE HCL 100 MG PO TABS
100.0000 mg | ORAL_TABLET | Freq: Every day | ORAL | 0 refills | Status: DC
  Filled 2024-07-18: qty 90, 90d supply, fill #0

## 2024-07-18 NOTE — Telephone Encounter (Signed)
 Last seen on 06/25/24 Follow up scheduled on 09/26/24

## 2024-07-19 ENCOUNTER — Telehealth: Payer: Self-pay | Admitting: Family Medicine

## 2024-07-19 NOTE — Telephone Encounter (Signed)
 LVM asking pt to call back to reschedule 07/30/24 appt - MD departure

## 2024-07-19 NOTE — Telephone Encounter (Signed)
 Patient called to reschedule appointment due to Dr. Ines no longer with [facility]

## 2024-07-19 NOTE — Telephone Encounter (Signed)
 Faith I sent in the refill for patient already.

## 2024-07-20 ENCOUNTER — Ambulatory Visit: Admitting: Psychology

## 2024-07-24 ENCOUNTER — Encounter: Payer: Self-pay | Admitting: Physical Medicine & Rehabilitation

## 2024-07-25 ENCOUNTER — Ambulatory Visit: Admitting: Psychology

## 2024-07-27 ENCOUNTER — Telehealth: Admitting: Adult Health

## 2024-07-28 ENCOUNTER — Other Ambulatory Visit: Payer: Self-pay

## 2024-07-30 ENCOUNTER — Ambulatory Visit: Admitting: Neurology

## 2024-08-01 ENCOUNTER — Ambulatory Visit (INDEPENDENT_AMBULATORY_CARE_PROVIDER_SITE_OTHER): Admitting: Psychology

## 2024-08-01 DIAGNOSIS — F411 Generalized anxiety disorder: Secondary | ICD-10-CM | POA: Diagnosis not present

## 2024-08-01 DIAGNOSIS — F429 Obsessive-compulsive disorder, unspecified: Secondary | ICD-10-CM | POA: Diagnosis not present

## 2024-08-01 DIAGNOSIS — F330 Major depressive disorder, recurrent, mild: Secondary | ICD-10-CM | POA: Diagnosis not present

## 2024-08-01 NOTE — Progress Notes (Unsigned)
 [facility] Counselor/Therapist Progress Note  Patient ID: QUITA Jordan, MRN: [id number],    Date:  08/01/2024  Time Spent: 51 minutes  Time in:11:03 Time out: 12:03  Treatment Type:- The patient was seen via video visit.  She gave verbal consent for the session to be on video on caregility.  The patient was in her home alone and therapist was in the office.  Reported Symptoms: anxiety, panic attacks, sadness  Mental Status Exam: Appearance:  Casual     Behavior: Appropriate  Motor: Normal  Speech/Language:  Normal Rate  Affect: blunted  Mood: pleasant  Thought process: normal  Thought content:   WNL  Sensory/Perceptual disturbances:   WNL  Orientation: oriented to person, place, time/date, and situation  Attention: Good  Concentration: Good  Memory: WNL  Fund of knowledge:  Good  Insight:   Fair  Judgment:  Good  Impulse Control: Good   Risk Assessment: Danger to Self:  No Self-injurious Behavior: No Danger to Others: No Duty to Warn:no Physical Aggression / Violence:No  Access to Firearms a concern: No  Gang Involvement:No   Subjective: The patient was seen for an individual therapy session  via video visit today.  The patient gave verbal permission for the session to be on video on caregility and she is aware of the limitations of telehealth.  The patient was in her home alone and therapist was in the office.  The patient presents with a blunted affect and her mood is pleasant.  The patient reports that she is doing okay at work and she still having to deal with her sister but she seems to be handling that okay right now.  Her sister does seem to be decompensating some because she is wanting attention and she even is decompensating to the point that she is competing with her sister because of the stroke.  We talked about her actually talking to a new gentleman and they met on the Internet and he is 80 and we talked about how she might be trying to sabotage  herself and trying to get in things before they can start with some of the comments that she has been making.  We rephrased some things and talked about the need for her to try to be more positive instead of sarcastic in regard to how she is interacting with him.  She does seem more excited because she has this going on.  She also seems more relieved because it seems that they do not think that she  has Fahr's disease.  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  In addition I did explain to her that I would be retiring at the end of March and we began talking about a transfer strategy.  The patient does not seem very happy about me leaving but we will talk about how to transfer her to somebody else or whether she wants to take a break after working with me for the rest of the time that I am here.  Interventions: Cognitive Behavioral Therapy and Assertiveness/Communication,, problem solving, psychoeducation,  EMDR as indicated, Meditation and mindfulness,   Diagnosis:GAD (generalized anxiety disorder)  Obsessive-compulsive disorder, unspecified type  Major depressive disorder, recurrent episode, mild  Plan: Client Abilities/Strengths  Intelligent, insightful, motivated  Client Treatment Preferences  Outpatient Individual therapy every other week  Client Statement of Needs   I feel better now, but still need some help with anxiety  Treatment Level  Outpatient Individual therapy  Symptoms  Frustration and  anxiety related to providing oversight and caretaking to an aging, ailing, and dependent  parent.: No Description Entered (Status: improved). Hypervigilance (e.g., feeling constantly on edge,  experiencing concentration difficulties, having trouble falling or staying asleep, exhibiting a general  state of irritability).: No Description Entered (Status: improved). Motor tension (e.g., restlessness,  tiredness, shakiness, muscle tension).: No Description Entered (Status: improved).   Problems Addressed  Anxiety, Phase Of Life Problems, Anxiety  Goals 1. Learn and implement coping skills that result in a reduction of anxiety  and worry, and improved daily functioning. Objective Learn and implement calming skills to reduce overall anxiety and manage anxiety symptoms. Target Date: 2026-08-09Frequency: Weekly Progress: 40 Modality: individual  Related Interventions 1. Teach the client calming/relaxation skills (e.g., applied relaxation, progressive muscle  relaxation, cue controlled relaxation; mindful breathing; biofeedback) and how to discriminate  better between relaxation and tension; teach the client how to apply these skills to his/her daily  life (e.g., New Directions in Progressive Muscle Relaxation by Thornell Collier, and  Hazlett-Stevens; Treating Generalized Anxiety Disorder by Rygh and Red). Objective Identify, challenge, and replace biased, fearful self-talk with positive, realistic, and empowering selftalk. Target Date: 2025-05-26 Frequency: weekly Progress: 40 Modality: individual Related Interventions 1. Explore the client's schema and self-talk that mediate his/her fear response; assist him/her in  challenging the biases; replace the distorted messages with reality-based alternatives and  positive, realistic self-talk that will increase his/her self-confidence in coping with irrational  fears (see Cognitive Therapy of Anxiety Disorders by Gretta armin Mon). Objective Learn and implement problem-solving strategies for realistically addressing worries. Target Date: 2026-08-09Frequency: weekly Progress: 40 Modality: individual 2. Resolve conflicted feelings and adapt to the new life circumstances. Objective Apply problem-solving skills to current circumstances. Target Date: 2025-05-26 Frequency: weekly Progress: 20 Modality: individual Related Interventions 1. Teach the client problem-resolution skills (e.g., defining the problem clearly,  brainstorming  multiple solutions, listing the pros and cons of each solution, seeking input from others,  selecting and implementing a plan of action, evaluating outcome, and readjusting plan as  necessary).   3. Stabilize anxiety level while increasing ability to function on a daily  basis. Diagnosis F33.1  Major depressive disorder, moderate 300.02 (Generalized anxiety disorder) - Open - [Signifier: n/a]  Axis  none 309.28 (Adjustment disorder with mixed anxiety and depressed  mood) - Open - [Signifier: n/a]  Adjustment Disorder,  With Anxiety   Marital conflict  Major Depressive disorder, moderate  Conditions For Discharge Achievement of treatment goals and objectives.  The patient approved this plan.   Kathelyn Gombos G Cayce Paschal, LCSW

## 2024-08-02 ENCOUNTER — Telehealth: Payer: Self-pay | Admitting: Physical Medicine & Rehabilitation

## 2024-08-02 ENCOUNTER — Encounter: Payer: Self-pay | Admitting: Physical Medicine & Rehabilitation

## 2024-08-02 ENCOUNTER — Other Ambulatory Visit (HOSPITAL_COMMUNITY): Payer: Self-pay

## 2024-08-02 NOTE — Telephone Encounter (Signed)
 Pt call and asked for a letter to regulate her own hours for work. She mentioned that she wrote previously to endorsed her request

## 2024-08-02 NOTE — Telephone Encounter (Signed)
 Note is on my desk in my outbox

## 2024-08-06 ENCOUNTER — Telehealth: Payer: Self-pay | Admitting: Pharmacist

## 2024-08-06 NOTE — Telephone Encounter (Signed)
 Pharmacy Patient Advocate Encounter   Received notification from CoverMyMeds that prior authorization for Ubrelvy  100MG  tablets is required/requested.   Insurance verification completed.   The patient is insured through [facility].   Per test claim: PA required; PA submitted to above mentioned insurance via Latent Key/confirmation #/EOC [id number] Status is pending

## 2024-08-06 NOTE — Telephone Encounter (Signed)
 Pharmacy Patient Advocate Encounter  Received notification from [facility] that Prior Authorization for UBRELVY  100 MG PO TABS has been APPROVED from 08/06/2024 to 08/05/2025   PA #/Case ID/Reference #: [id number]

## 2024-08-07 ENCOUNTER — Encounter: Payer: Self-pay | Admitting: Adult Health

## 2024-08-07 ENCOUNTER — Telehealth (INDEPENDENT_AMBULATORY_CARE_PROVIDER_SITE_OTHER): Admitting: Adult Health

## 2024-08-07 ENCOUNTER — Other Ambulatory Visit: Payer: Self-pay

## 2024-08-07 ENCOUNTER — Ambulatory Visit: Admitting: Psychology

## 2024-08-07 DIAGNOSIS — G479 Sleep disorder, unspecified: Secondary | ICD-10-CM

## 2024-08-07 DIAGNOSIS — F429 Obsessive-compulsive disorder, unspecified: Secondary | ICD-10-CM

## 2024-08-07 DIAGNOSIS — F411 Generalized anxiety disorder: Secondary | ICD-10-CM

## 2024-08-07 DIAGNOSIS — G4700 Insomnia, unspecified: Secondary | ICD-10-CM | POA: Diagnosis not present

## 2024-08-07 DIAGNOSIS — F410 Panic disorder [episodic paroxysmal anxiety] without agoraphobia: Secondary | ICD-10-CM

## 2024-08-07 MED ORDER — FLUOXETINE HCL 20 MG PO CAPS
60.0000 mg | ORAL_CAPSULE | Freq: Every day | ORAL | 0 refills | Status: DC
  Filled 2024-08-07 – 2024-08-19 (×2): qty 90, 30d supply, fill #0

## 2024-08-07 MED ORDER — TRAZODONE HCL 100 MG PO TABS
100.0000 mg | ORAL_TABLET | Freq: Every day | ORAL | 0 refills | Status: DC
  Filled 2024-08-07 – 2024-10-07 (×2): qty 90, 90d supply, fill #0

## 2024-08-07 NOTE — Progress Notes (Signed)
 Faith Jordan [id number] 02/01/1996 28 y.o.  Virtual Visit via Video Note  I connected with pt @ on 08/07/24 at  1:30 PM EDT by a video enabled telemedicine application and verified that I am speaking with the correct person using two identifiers.   I discussed the limitations of evaluation and management by telemedicine and the availability of in person appointments. The patient expressed understanding and agreed to proceed.  I discussed the assessment and treatment plan with the patient. The patient was provided an opportunity to ask questions and all were answered. The patient agreed with the plan and demonstrated an understanding of the instructions.   The patient was advised to call back or seek an in-person evaluation if the symptoms worsen or if the condition fails to improve as anticipated.  I provided 25 minutes of non-face-to-face time during this encounter.  The patient was located at home.  The provider was located at [facility].   Angeline LOISE Sayers, NP   Subjective:   Patient ID:  Faith Jordan is a 28 y.o. (DOB 1995/12/11) female.  Chief Complaint: No chief complaint on file.   HPI ABIGAILE ROSSIE presents for follow-up of GAD, OCD, panic disorder, and insomnia.  Referred by therapist - Bambi Cottle.  Describes mood today as better. Pleasant. Reports increased tearfulness. Mood symptoms - reports anxiety at times. Denies depression. Reports improved interest and motivation. Denies irritability. Reports recent panic attacks. Reports some worry about having another stroke. Denies rumination. Denies over thinking. Denies obsessive thoughts and acts. Reports mood as improved. Stating I feel like I'm doing better than I was. Feels like medications are helpful and would like to continue them. Taking medications as prescribed.   Energy levels improved - still getting tired later in the day. 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   Enjoys some usual interests and activities. Divorced.  Lives alone. Spending time with family.  Appetite decreased. Weight loss - 20+ pounds - 138 pounds - 67. Sleeps well most nights. Averages 8 to 10 hours. Reports napping on her off days.  Reports focus and concentration stable. Reports occasional word finding issues. Completing tasks. Managing aspects of household. Reports she has returned to work - full-time - 12 hours a week. Denies SI or HI.  Denies AH or VH. Denies self harm. Denies substance use.  Previous medication trials:  Cymbalta , Gabapentin , Trazadone, Hydroxyzine , Propranolol , Wellbutrin , Lorazepam  and Lexapro , Prozac , Zoloft , Prazosin , Effexor   Review of Systems:  Review of Systems  Musculoskeletal:  Negative for gait problem.  Neurological:  Negative for tremors.  Psychiatric/Behavioral:         Please refer to HPI    Medications: I have reviewed the patient's current medications.  Current Outpatient Medications  Medication Sig Dispense Refill   acetaminophen  (TYLENOL ) 325 MG tablet Take 2 tablets (650 mg total) by mouth every 6 (six) hours as needed for mild pain (pain score 1-3), fever or headache.     Atorvastatin  Calcium  (LIPITOR  PO) Take 5 mg by mouth daily.     baclofen  (LIORESAL ) 10 MG tablet Take 1 tablet (10 mg total) by mouth 3 (three) times daily. (Patient taking differently: Take 10 mg by mouth in the morning and at bedtime. Takes 5 mg in am and afternoon and 10 mg at bedtime) 270 each 3   botulinum toxin Type A  (BOTOX ) 200 units injection Provider to inject 155 units into the muscles of the head and neck every 12 weeks. Discard remainder. 1 each 3   Cholecalciferol (  VITAMIN D3) 50 MCG (2000 UT) capsule Take 2,000 Units by mouth in the morning.     clopidogrel  (PLAVIX ) 75 MG tablet Take 1 tablet (75 mg total) by mouth daily. 90 tablet 1   Coenzyme Q10 (COQ-10 PO) Take 1 tablet by mouth in the morning.     FLUoxetine  (PROZAC ) 20 MG capsule Take three capsules by mouth at bedtime. 90 capsule 2    Galcanezumab -gnlm (EMGALITY ) 120 MG/ML SOAJ Inject 1 Dose into the skin every 30 (thirty) days.     Galcanezumab -gnlm (EMGALITY ) 120 MG/ML SOAJ Inject 120 mg into the skin every 30 (thirty) days. 3 mL 0   LORazepam  (ATIVAN ) 1 MG tablet Take 1 tablet (1 mg total) by mouth 2 (two) times daily. 60 tablet 2   ondansetron  (ZOFRAN -ODT) 4 MG disintegrating tablet Take 1 tablet (4 mg total) by mouth every 8 (eight) hours as needed for nausea or vomiting. 20 tablet 0   Rimegepant Sulfate  (NURTEC) 75 MG TBDP Take 1 tablet by mouth as needed.     topiramate  (TOPAMAX ) 50 MG tablet Take 1 tablet (50 mg total) by mouth daily. May also take 1 tablet (50 mg total) daily as needed (daily during menstrual cycle). 180 tablet 3   traZODone  (DESYREL ) 100 MG tablet Take 1 tablet (100 mg total) by mouth at bedtime. 90 tablet 0   Ubrogepant  (UBRELVY ) 100 MG TABS Take 1 tablet onset migraine may repeat in 2 hours if needed. (Max 2 tabs/ 24 hrs) 16 tablet 11   vitamin B-12 (CYANOCOBALAMIN ) 500 MCG tablet Take 500 mcg by mouth daily.     No current facility-administered medications for this visit.    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, blistering peeling skin   Nsaids Dermatitis    Reaction similar to Stephens-Johnson, blistering peeling rash   Sulfa Antibiotics Swelling    Facial swelling    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   Acute flank pain 06/02/2022   Angio-edema 04/27/2016   Angioedema    Aquagenic angio-edema-urticaria    Asthma    no problems /not used recently   Dysautonomia (HCC)    Dysrhythmia    sinus tachycardia   Fibroid    right breast, adenoma   Headache(784.0)    History of COVID-19 11/21/2020   Hives 12/22/2015   Pneumonia    hx  6th grade   Sensation of fullness in both ears 02/02/2023   Snoring 02/11/2022   Urinary  frequency 02/12/2019   Vaginal yeast infection 06/02/2022   Vasculitis     Family History  Problem Relation Age of Onset   Depression Mother    Migraines Mother    Narcolepsy Mother    Hypothyroidism Mother    Breast cancer Maternal Grandmother 79   Myasthenia gravis Maternal Grandfather    Asthma Other    Cancer Other    Epilepsy Other    Allergic rhinitis Neg Hx    Angioedema Neg Hx    Atopy Neg Hx    Eczema Neg Hx    Immunodeficiency Neg Hx    Urticaria Neg Hx    Stroke Neg Hx     Social History   Socioeconomic History   Marital status: Significant Other    Spouse name: Not on file   Number of children: Not on file   Years of education: Not  on file   Highest education level: Bachelor's degree (e.g., BA, AB, BS)  Occupational History   Not on file  Tobacco Use   Smoking status: Never   Smokeless tobacco: Never  Vaping Use   Vaping status: Never Used  Substance and Sexual Activity   Alcohol use: No   Drug use: No   Sexual activity: Yes  Other Topics Concern   Not on file  Social History Narrative   Lives with fiance   R handed   Caffeine : 1 drink a day   Social Drivers of Corporate investment banker Strain: Not on file  Food Insecurity: No Food Insecurity (01/09/2024)   Hunger Vital Sign    Worried About Running Out of Food in the Last Year: Never true    Ran Out of Food in the Last Year: Never true  Transportation Needs: No Transportation Needs (01/09/2024)   PRAPARE - Administrator, Civil Service (Medical): No    Lack of Transportation (Non-Medical): No  Physical Activity: Not on file  Stress: Not on file  Social Connections: Not on file  Intimate Partner Violence: Not At Risk (01/09/2024)   Humiliation, Afraid, Rape, and Kick questionnaire    Fear of Current or Ex-Partner: No    Emotionally Abused: No    Physically Abused: No    Sexually Abused: No    Past Medical History, Surgical history, Social history, and Family history were  reviewed and updated as appropriate.   Please see review of systems for further details on the patient's review from today.   Objective:   Physical Exam:  There were no vitals taken for this visit.  Physical Exam Constitutional:      General: She is not in acute distress. Musculoskeletal:        General: No deformity.  Neurological:     Mental Status: She is alert and oriented to person, place, and time.     Coordination: Coordination normal.  Psychiatric:        Attention and Perception: Attention and perception normal. She does not perceive auditory or visual hallucinations.        Mood and Affect: Mood normal. Mood is not anxious or depressed. Affect is not labile, blunt, angry or inappropriate.        Speech: Speech normal.        Behavior: Behavior normal.        Thought Content: Thought content normal. Thought content is not paranoid or delusional. Thought content does not include homicidal or suicidal ideation. Thought content does not include homicidal or suicidal plan.        Cognition and Memory: Cognition and memory normal.        Judgment: Judgment normal.     Comments: Insight intact     Lab Review:     Component Value Date/Time   NA 139 02/24/2024 1521   NA 140 02/07/2024 0950   K 4.0 02/24/2024 1521   CL 107 02/24/2024 1521   CO2 20 (L) 02/24/2024 1515   GLUCOSE 122 (H) 02/24/2024 1521   BUN 6 02/24/2024 1521   BUN 9 02/07/2024 0950   CREATININE 0.90 02/24/2024 1521   CALCIUM  9.3 02/24/2024 1515   CALCIUM  8.9 01/07/2024 2231   PROT 6.8 02/24/2024 1515   PROT 7.2 02/07/2024 0950   ALBUMIN 3.9 02/24/2024 1515   ALBUMIN 4.7 02/07/2024 0950   AST 25 02/24/2024 1515   ALT 27 02/24/2024 1515   ALKPHOS 85 02/24/2024 1515  BILITOT 0.9 02/24/2024 1515   BILITOT 0.8 02/07/2024 0950   GFRNONAA >60 02/24/2024 1515   GFRAA >60 12/01/2017 1018       Component Value Date/Time   WBC 9.3 02/24/2024 1515   RBC 4.89 02/24/2024 1515   HGB 14.3 02/24/2024 1521    HGB 15.0 02/07/2024 0950   HCT 42.0 02/24/2024 1521   HCT 45.6 02/07/2024 0950   PLT 278 02/24/2024 1515   PLT 269 02/07/2024 0950   MCV 86.3 02/24/2024 1515   MCV 88 02/07/2024 0950   MCH 29.0 02/24/2024 1515   MCHC 33.6 02/24/2024 1515   RDW 13.0 02/24/2024 1515   RDW 13.1 02/07/2024 0950   LYMPHSABS 2.1 02/24/2024 1515   LYMPHSABS 2.0 02/07/2024 0950   MONOABS 0.8 02/24/2024 1515   EOSABS 0.1 02/24/2024 1515   EOSABS 0.0 02/07/2024 0950   BASOSABS 0.0 02/24/2024 1515   BASOSABS 0.0 02/07/2024 0950    No results found for: POCLITH, LITHIUM   No results found for: PHENYTOIN, PHENOBARB, VALPROATE, CBMZ   .res Assessment: Plan:    Plan:  PDMP reviewed  Ativan  1mg  BID prn anxiety  Prozac  60mg  daily for anxiety and depression Trazadone 100mg  at hs  Genesight testing on file.  RTC 2 months  25 minutes spent dedicated to the care of this patient on the date of this encounter to include pre-visit review of records, ordering of medication, post visit documentation, and face-to-face time with the patient discussing GAD, OCD, panic disorder and insomnia. Discussed continuing current medication regimen.  Patient advised to contact office with any questions, adverse effects, or acute worsening in signs and symptoms.  Discussed potential benefits, risk, and side effects of benzodiazepines to include potential risk of tolerance and dependence, as well as possible drowsiness.  Advised patient not to drive if experiencing drowsiness and to take lowest possible effective dose to minimize risk of dependence and tolerance.   There are no diagnoses linked to this encounter.   Please see After Visit Summary for patient specific instructions.  Future Appointments  Date Time Provider Department Center  08/07/2024  1:30 PM Orpha Dain, Angeline Mattocks, NP [facility] None  08/16/2024 10:00 AM Cottle, Bambi G, LCSW [facility] None  08/22/2024  4:00 PM Cottle, Bambi G, LCSW [facility]  None  09/05/2024  9:00 AM Cottle, Bambi G, LCSW [facility] None  09/07/2024  9:30 AM Celestia Rima, NP [facility] None  09/26/2024  3:30 PM Cary, Amy, NP [facility] None  10/02/2024 11:30 AM Kirsteins, Prentice BRAVO, MD [facility] CPR  11/26/2024 10:30 AM Cary No, NP [facility] None  02/26/2025  4:00 PM Penumalli, Eduard SAUNDERS, MD [facility] None    No orders of the defined types were placed in this encounter.     -------------------------------

## 2024-08-08 NOTE — Patient Instructions (Incomplete)

## 2024-08-08 NOTE — Progress Notes (Unsigned)
 PATIENT: Faith Jordan DOB: 02-08-1996  REASON FOR VISIT: follow up HISTORY FROM: patient  No chief complaint on file.    HISTORY OF PRESENT ILLNESS:  08/08/24 ALL: [name] returns for follow up for migraines. She was last seen by me 06/2024 for Botox .   She has returned to work. She is working three 12 hour shifts. She was seen by psychiatry 08/07/2024. She is taking fluoxetine  20mg , trazodone  100mg  daily and lorazepam  PRN.   She continues atorvastatin  5mg  and Plavix ?? She has follow up with Dr Lanny, [facility], 08/22/2024.   11/21/2023 ALL:  [name] returns for follow up for migraines. She was last seen 10/2022 and we continued Emgality , topiramate  and Ubrelvy .   She reports headaches are fairly well managed. She feels they are a little worse over the past 2-3 months and feels it is related to stress and weather changes. She is working in the ER 3a-3p. Sleep schedules have been off. She has worsening headaches during menstrual cycle. She has tried multiple OCPs. Ubrelvy  has not been as effective, recently. Mood is stable on pPozac. She continues to follow with psychiatry and psychology.   Meds she has tried: Emgality , topiramate , propranolol  (recently stopped for tachycardia), imipramide.  Ubrelvy  (minimally effective). She has taken Maxalt  and Imitrex for abortive therapy but reports tachycardia with a triptan therapy.  She is allergic to NSAIDs.  11/17/2022 ALL:  Chole returns for follow up for migraines and excessive daytime sleepiness. PSG 07/2022 did not indicate any concerns for sleep breathing disorder or desaturations. Counseling and pain management recommended by Dr Chalice. We switched her back to Emgality  in 05/2022 as Ajovy  was ineffective and continued topiramate  50mg  daily and Ubrelvy  PRN. Migraines are stable. She may have 3-4 per month. Ubrelvy  works . She feels weather changes and menstrual cycle are common triggers.   06/01/2022 ALL: [name] returns for follow up  for migraines. She was last seen 05/2021 and doing well. We advised to continue Emgality  and Ubrelvy  and discussed weaning topiramate  as headaches were rare. She was switched to Ajovy  in 02/2022 due to insurance coverage and has continued topiramate . Since, migraines have worsened. She is having 4-5 migraines every month. Ubrelvy  continues to offer relief. She has had more dizziness and nausea with headaches.   She was seen in consult with Dr Chalice 05/2022 for excessive sleepiness and witnessed apneic events. She has a history of OSA as a child resolved following tonsil and adenoids removed. Sleep study was ordered.   06/04/2021 ALL: [name] returns for follow up for migraines. She continues Emgality , topiramate  50mg  daily and Ubrelvy  as needed. She is doing very well. She rarely has headaches. She can not remember the last time she took Ubrelvy . She recently switched birth control to Kyleena. She is feeling well today and without concerns.   06/03/2020 ALL:  GLYNIS Jordan is a 28 y.o. female here today for follow up for migraines. She continues topiramate , Emgality  and propranolol  for prevention. Ubrelvy  helps with migraine abortion. She reports that she is doing very well from a migraine perspective. She can not remember the last time she needed to take Ubrelvy . She may have 1-2 mild headaches just prior to due date for Emgality . 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 She is staying very busy.   HISTORY: (copied from my note on 05/31/2019)  Faith Jordan is a 28 y.o. female here today for follow up of migraines. She was restarted on topiramate  50  mg at night about 4 months ago. She continued Emgality  injections and propranalol (originally prescribed for tachycardia).    She is taking Amethia for birth control. She is taking OCP continuously for three months with quarterly menstrual cycles. She reports that migraines are very well managed until the week of menstrual cycle. She states  that migraine is usually retro orbital (usually right but can be on the left) with pounding and light/sound sensitivity. She has tried Ubrelvy , Aleve , and Tylenol  intermittently for acute management but does not feel anything helps. She can not tolerate triptans due to tachycardia. She has a reported allergy of dermatitis with ibuprofen. She does not have any reaction with Aleve .    She is currently on her menstrual cycle. She has a terrible migraine that is mostly located behind her right eye. She does have mild pain of left forehead as well. She is requesting a nerve block today as this has worked well in the past.    HISTORY: (copied from my note on 01/16/2019)   Faith Jordan is a 28 y.o. female seen today for follow up.  She reports that her migraines have worsened specifically over the last month.  She feels that stress is definitely a trigger.  She is in school and finishing up with exams at this time.  She knows that weather also contributes.  She notices significant worsening around the time of her menstrual cycle.  Pain is typically right-sided retro-orbital pressure.  Occasionally there is throbbing, light sensitivity and nausea.  She continues Emgality  every month.  She is using Ubrelvy  for abortive therapy that does help temporarily.  She is also on propranolol  for tachycardia.  She reports a history of syncope as well that is followed closely by her cardiologist.   She has tried topiramate  in the past but is unsure why this medication was stopped.  She is also tried imipemide but reports that it did not help.  She has taken Maxalt  and Imitrex for abortive therapy but reports tachycardia with a triptan therapy.  She is allergic to NSAIDs.   REVIEW OF SYSTEMS: Out of a complete 14 system review of symptoms, the patient complains only of the following symptoms, headaches, fatigue, dizziness  and all other reviewed systems are negative.  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     Atorvastatin  Calcium  (LIPITOR  PO) Take 5 mg by mouth daily.     baclofen  (LIORESAL ) 10 MG tablet Take 1 tablet (10 mg total) by mouth 3 (three) times daily. (Patient taking differently: Take 10 mg by mouth in the morning and at bedtime. Takes 5 mg in am and afternoon and 10 mg at bedtime) 270 each 3   botulinum toxin Type A  (BOTOX ) 200 units injection Provider to inject 155 units into the muscles of the head and neck every 12 weeks. Discard remainder. 1 each 3   Cholecalciferol (VITAMIN D3) 50 MCG (2000 UT) capsule Take 2,000 Units by mouth in the morning.     clopidogrel  (PLAVIX ) 75 MG  tablet Take 1 tablet (75 mg total) by mouth daily. 90 tablet 1   Coenzyme Q10 (COQ-10 PO) Take 1 tablet by mouth in the morning.     FLUoxetine  (PROZAC ) 20 MG capsule Take 3 capsules (60 mg total) by mouth at bedtime. 90 capsule 0   Galcanezumab -gnlm (EMGALITY ) 120 MG/ML SOAJ Inject 1 Dose into the skin every 30 (thirty) days.     Galcanezumab -gnlm (EMGALITY ) 120 MG/ML SOAJ Inject 120 mg into the skin every 30 (thirty) days. 3 mL 0   LORazepam  (ATIVAN ) 1 MG tablet Take 1 tablet (1 mg total) by mouth 2 (two) times daily. 60 tablet 2   ondansetron  (ZOFRAN -ODT) 4 MG disintegrating tablet Take 1 tablet (4 mg total) by mouth every 8 (eight) hours as needed for nausea or vomiting. 20 tablet 0   Rimegepant Sulfate  (NURTEC) 75 MG TBDP Take 1 tablet by mouth as needed.     topiramate  (TOPAMAX ) 50 MG tablet Take 1 tablet (50 mg total) by mouth daily. May  also take 1 tablet (50 mg total) daily as needed (daily during menstrual cycle). 180 tablet 3   traZODone  (DESYREL ) 100 MG tablet Take 1 tablet (100 mg total) by mouth at bedtime. 90 tablet 0   Ubrogepant  (UBRELVY ) 100 MG TABS Take 1 tablet onset migraine may repeat in 2 hours if needed. (Max 2 tabs/ 24 hrs) 16 tablet 11   vitamin B-12 (CYANOCOBALAMIN ) 500 MCG tablet Take 500 mcg by mouth daily.     No facility-administered medications prior to visit.    PAST MEDICAL HISTORY: 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   Acute flank pain 06/02/2022   Angio-edema 04/27/2016   Angioedema    Aquagenic angio-edema-urticaria    Asthma    no problems /not used recently   Dysautonomia (HCC)    Dysrhythmia    sinus tachycardia   Fibroid    right breast, adenoma   Headache(784.0)    History of COVID-19 11/21/2020   Hives 12/22/2015   Pneumonia    hx  6th grade   Sensation of fullness in both ears 02/02/2023   Snoring 02/11/2022   Urinary frequency 02/12/2019   Vaginal yeast infection 06/02/2022   Vasculitis     PAST SURGICAL HISTORY: Past Surgical History:  Procedure Laterality Date   ADENOIDECTOMY     BREAST LUMPECTOMY Right    BREAST LUMPECTOMY Right    MASS EXCISION Right 10/07/2014   Procedure: EXCISION OF RIGHT BREAST MASS;  Surgeon: Krystal Russell, MD;  Location: [facility];  Service: General;  Laterality: Right;   TONSILLECTOMY AND ADENOIDECTOMY     TOOTH EXTRACTION     TRANSESOPHAGEAL ECHOCARDIOGRAM (CATH LAB) N/A 01/10/2024   Procedure: TRANSESOPHAGEAL ECHOCARDIOGRAM;  Surgeon: Jeffrie Oneil BROCKS, MD;  Location: [facility] INVASIVE CV LAB;  Service: Cardiovascular;  Laterality: N/A;    FAMILY HISTORY: Family History  Problem Relation Age of Onset   Depression Mother    Migraines Mother    Narcolepsy Mother    Hypothyroidism Mother    Breast cancer Maternal Grandmother 48   Myasthenia gravis Maternal Grandfather    Asthma Other     Cancer Other    Epilepsy Other    Allergic rhinitis Neg Hx    Angioedema Neg Hx    Atopy Neg Hx    Eczema Neg Hx    Immunodeficiency Neg Hx    Urticaria Neg Hx    Stroke Neg Hx  SOCIAL HISTORY: Social History   Socioeconomic History   Marital status: Significant Other    Spouse name: Not on file   Number of children: Not on file   Years of education: Not on file   Highest education level: Bachelor's degree (e.g., BA, AB, BS)  Occupational History   Not on file  Tobacco Use   Smoking status: Never   Smokeless tobacco: Never  Vaping Use   Vaping status: Never Used  Substance and Sexual Activity   Alcohol use: No   Drug use: No   Sexual activity: Yes  Other Topics Concern   Not on file  Social History Narrative   Lives with fiance   R handed   Caffeine : 1 drink a day   Social Drivers of Corporate investment banker Strain: Not on file  Food Insecurity: No Food Insecurity (01/09/2024)   Hunger Vital Sign    Worried About Running Out of Food in the Last Year: Never true    Ran Out of Food in the Last Year: Never true  Transportation Needs: No Transportation Needs (01/09/2024)   PRAPARE - Administrator, Civil Service (Medical): No    Lack of Transportation (Non-Medical): No  Physical Activity: Not on file  Stress: Not on file  Social Connections: Not on file  Intimate Partner Violence: Not At Risk (01/09/2024)   Humiliation, Afraid, Rape, and Kick questionnaire    Fear of Current or Ex-Partner: No    Emotionally Abused: No    Physically Abused: No    Sexually Abused: No      PHYSICAL EXAM  There were no vitals filed for this visit.      There is no height or weight on file to calculate BMI.  Generalized: Well developed, in no acute distress  Cardiology: normal rate and rhythm, no murmur noted Respiratory: clear to auscultation bilaterally  Neurological examination  Mentation: Alert oriented to time, place, history taking. Follows all  commands speech and language fluent Cranial nerve II-XII: Pupils were equal round reactive to light. Extraocular movements were full, visual field were full  Motor: The motor testing reveals 5 over 5 strength of all 4 extremities. Good symmetric motor tone is noted throughout.  Gait and station: Gait is normal.   DIAGNOSTIC DATA (LABS, IMAGING, TESTING) - I reviewed patient records, labs, notes, testing and imaging myself where available.      No data to display           Lab Results  Component Value Date   WBC 9.3 02/24/2024   HGB 14.3 02/24/2024   HCT 42.0 02/24/2024   MCV 86.3 02/24/2024   PLT 278 02/24/2024      Component Value Date/Time   NA 139 02/24/2024 1521   NA 140 02/07/2024 0950   K 4.0 02/24/2024 1521   CL 107 02/24/2024 1521   CO2 20 (L) 02/24/2024 1515   GLUCOSE 122 (H) 02/24/2024 1521   BUN 6 02/24/2024 1521   BUN 9 02/07/2024 0950   CREATININE 0.90 02/24/2024 1521   CALCIUM  9.3 02/24/2024 1515   CALCIUM  8.9 01/07/2024 2231   PROT 6.8 02/24/2024 1515   PROT 7.2 02/07/2024 0950   ALBUMIN 3.9 02/24/2024 1515   ALBUMIN 4.7 02/07/2024 0950   AST 25 02/24/2024 1515   ALT 27 02/24/2024 1515   ALKPHOS 85 02/24/2024 1515   BILITOT 0.9 02/24/2024 1515   BILITOT 0.8 02/07/2024 0950   GFRNONAA >60 02/24/2024 1515   GFRAA >60  12/01/2017 1018   Lab Results  Component Value Date   CHOL 103 04/11/2024   HDL 37 04/11/2024   LDLCALC 54 04/11/2024   TRIG 105 04/11/2024   CHOLHDL 2.7 02/07/2024   Lab Results  Component Value Date   HGBA1C 4.9 01/07/2024   Lab Results  Component Value Date   VITAMINB12 280 02/08/2024   Lab Results  Component Value Date   TSH 1.08 11/29/2023    ASSESSMENT AND PLAN 28 y.o. year old female  has a past medical history of Acne, Acquired breast deformity (12/22/2015), Acute flank pain (06/02/2022), Angio-edema (04/27/2016), Angioedema, Aquagenic angio-edema-urticaria, Asthma, Dysautonomia (HCC), Dysrhythmia, Fibroid,  Headache(784.0), History of COVID-19 (11/21/2020), Hives (12/22/2015), Pneumonia, Sensation of fullness in both ears (02/02/2023), Snoring (02/11/2022), Urinary frequency (02/12/2019), Vaginal yeast infection (06/02/2022), and Vasculitis. here with   No diagnosis found.   Brianna reports headaches have worsened slightly and mostly around the time of her menstrual cycle. We will continue Emgality  monthly and topiramate  50mg  daily for prevention. She may increase topiramate  to 100mg  daily during menstrual cycle. We will switch Ubrelvy  to Nurtec for abortive therapy. Unable to tolerate triptans d/t tachycardia.  She will continue propranolol  20mg  daily for tachycardia, managed by cardiology. Advised against pregnancy. She will continue healthy lifestyle habits. She will follow up with me in 1 year, sooner if needed.    No orders of the defined types were placed in this encounter.     No orders of the defined types were placed in this encounter.     I spent 30 minutes of face-to-face and non-face-to-face time with patient.  This included previsit chart review, lab review, study review, order entry, electronic health record documentation, patient education.   Greig Forbes, FNP-C 08/08/2024, 4:08 PM [facility] [address] [place], [place] [address] [phone]

## 2024-08-09 ENCOUNTER — Other Ambulatory Visit: Payer: Self-pay

## 2024-08-09 ENCOUNTER — Encounter: Payer: Self-pay | Admitting: Family Medicine

## 2024-08-09 ENCOUNTER — Ambulatory Visit (INDEPENDENT_AMBULATORY_CARE_PROVIDER_SITE_OTHER): Admitting: Family Medicine

## 2024-08-09 VITALS — BP 98/70 | HR 75 | Resp 15 | Ht 67.0 in | Wt 138.5 lb

## 2024-08-09 DIAGNOSIS — G43109 Migraine with aura, not intractable, without status migrainosus: Secondary | ICD-10-CM

## 2024-08-09 DIAGNOSIS — I639 Cerebral infarction, unspecified: Secondary | ICD-10-CM | POA: Diagnosis not present

## 2024-08-09 DIAGNOSIS — R5383 Other fatigue: Secondary | ICD-10-CM

## 2024-08-09 DIAGNOSIS — R110 Nausea: Secondary | ICD-10-CM | POA: Diagnosis not present

## 2024-08-09 DIAGNOSIS — G43409 Hemiplegic migraine, not intractable, without status migrainosus: Secondary | ICD-10-CM | POA: Diagnosis not present

## 2024-08-09 DIAGNOSIS — I6381 Other cerebral infarction due to occlusion or stenosis of small artery: Secondary | ICD-10-CM

## 2024-08-09 MED ORDER — PROMETHAZINE HCL 12.5 MG PO TABS
12.5000 mg | ORAL_TABLET | Freq: Four times a day (QID) | ORAL | 2 refills | Status: AC | PRN
  Filled 2024-08-09: qty 30, 8d supply, fill #0

## 2024-08-10 ENCOUNTER — Ambulatory Visit: Payer: Self-pay | Admitting: Family Medicine

## 2024-08-10 LAB — CBC WITH DIFFERENTIAL/PLATELET
Basophils Absolute: 0 x10E3/uL (ref 0.0–0.2)
Basos: 0 %
EOS (ABSOLUTE): 0 x10E3/uL (ref 0.0–0.4)
Eos: 0 %
Hematocrit: 45.8 % (ref 34.0–46.6)
Hemoglobin: 14.8 g/dL (ref 11.1–15.9)
Immature Grans (Abs): 0 x10E3/uL (ref 0.0–0.1)
Immature Granulocytes: 0 %
Lymphocytes Absolute: 1.7 x10E3/uL (ref 0.7–3.1)
Lymphs: 28 %
MCH: 29.2 pg (ref 26.6–33.0)
MCHC: 32.3 g/dL (ref 31.5–35.7)
MCV: 91 fL (ref 79–97)
Monocytes Absolute: 0.5 x10E3/uL (ref 0.1–0.9)
Monocytes: 9 %
Neutrophils Absolute: 3.8 x10E3/uL (ref 1.4–7.0)
Neutrophils: 63 %
Platelets: 300 x10E3/uL (ref 150–450)
RBC: 5.06 x10E6/uL (ref 3.77–5.28)
RDW: 12.8 % (ref 11.7–15.4)
WBC: 6.1 x10E3/uL (ref 3.4–10.8)

## 2024-08-10 LAB — LIPID PANEL
Chol/HDL Ratio: 3 ratio (ref 0.0–4.4)
Cholesterol, Total: 125 mg/dL (ref 100–199)
HDL: 42 mg/dL (ref >39)
LDL Chol Calc (NIH): 70 mg/dL (ref 0–99)
Triglycerides: 60 mg/dL (ref 0–149)
VLDL Cholesterol Cal: 13 mg/dL (ref 5–40)

## 2024-08-10 LAB — COMPREHENSIVE METABOLIC PANEL WITH GFR
ALT: 9 IU/L (ref 0–32)
AST: 14 IU/L (ref 0–40)
Albumin: 5 g/dL (ref 4.0–5.0)
Alkaline Phosphatase: 118 IU/L — ABNORMAL HIGH (ref 41–116)
BUN/Creatinine Ratio: 11 (ref 9–23)
BUN: 10 mg/dL (ref 6–20)
Bilirubin Total: 0.5 mg/dL (ref 0.0–1.2)
CO2: 20 mmol/L (ref 20–29)
Calcium: 9.5 mg/dL (ref 8.7–10.2)
Chloride: 103 mmol/L (ref 96–106)
Creatinine, Ser: 0.87 mg/dL (ref 0.57–1.00)
Globulin, Total: 2.6 g/dL (ref 1.5–4.5)
Glucose: 78 mg/dL (ref 70–99)
Potassium: 4 mmol/L (ref 3.5–5.2)
Sodium: 139 mmol/L (ref 134–144)
Total Protein: 7.6 g/dL (ref 6.0–8.5)
eGFR: 93 mL/min/1.73 (ref >59)

## 2024-08-10 LAB — TSH: TSH: 1.02 u[IU]/mL (ref 0.450–4.500)

## 2024-08-10 LAB — VITAMIN B12: Vitamin B-12: 503 pg/mL (ref 232–1245)

## 2024-08-10 LAB — VITAMIN D 25 HYDROXY (VIT D DEFICIENCY, FRACTURES): Vit D, 25-Hydroxy: 65.3 ng/mL (ref 30.0–100.0)

## 2024-08-13 ENCOUNTER — Telehealth: Payer: Self-pay | Admitting: Pharmacist

## 2024-08-13 NOTE — Telephone Encounter (Signed)
 Pharmacy Patient Advocate Encounter  Received notification from [facility] that Prior Authorization for EMGALITY  120 MG/ML [place] SOAJ has been APPROVED from 08/13/2024 to 08/12/2025   PA #/Case ID/Reference #: [id number]

## 2024-08-13 NOTE — Telephone Encounter (Signed)
 Pharmacy Patient Advocate Encounter   Received notification from CoverMyMeds that prior authorization for Emgality  120MG /ML auto-injectors (migraine) is required/requested.   Insurance verification completed.   The patient is insured through [facility].   Per test claim: PA required; PA submitted to above mentioned insurance via Latent Key/confirmation #/EOC [facility] Status is pending

## 2024-08-16 ENCOUNTER — Ambulatory Visit (INDEPENDENT_AMBULATORY_CARE_PROVIDER_SITE_OTHER): Admitting: Psychology

## 2024-08-16 DIAGNOSIS — F429 Obsessive-compulsive disorder, unspecified: Secondary | ICD-10-CM | POA: Diagnosis not present

## 2024-08-16 DIAGNOSIS — F411 Generalized anxiety disorder: Secondary | ICD-10-CM

## 2024-08-16 NOTE — Progress Notes (Signed)
 [facility] Counselor/Therapist Progress Note  Patient ID: AVERYANA PILLARS, MRN: [id number],    Date:  08/16/2024  Time Spent: 59 minutes  Time in:10:04 Time out: 11:03  Treatment Type:- The patient was seen via video visit.  She gave verbal consent for the session to be on video on caregility.  The patient was in her home alone and therapist was in the office.  Reported Symptoms: anxiety, panic attacks, sadness  Mental Status Exam: Appearance:  Casual     Behavior: Appropriate  Motor: Normal  Speech/Language:  Normal Rate  Affect: blunted  Mood: pleasant  Thought process: normal  Thought content:   WNL  Sensory/Perceptual disturbances:   WNL  Orientation: oriented to person, place, time/date, and situation  Attention: Good  Concentration: Good  Memory: WNL  Fund of knowledge:  Good  Insight:   Fair  Judgment:  Good  Impulse Control: Good   Risk Assessment: Danger to Self:  No Self-injurious Behavior: No Danger to Others: No Duty to Warn:no Physical Aggression / Violence:No  Access to Firearms a concern: No  Gang Involvement:No   Subjective: The patient was seen for an individual therapy session  via video visit today.  The patient gave verbal permission for the session to be on video on caregility and she is aware of the limitations of telehealth.  The patient was in her home alone and therapist was in the office.  The patient presents with a blunted affect and her mood is pleasant.  The patient reports that the guy she was talking to apparently did not want to meet at all.  We talked about her moving onto the next 1 and that it probably is about him and not about her.  The patient does feel like she has made some progress with therapy and that she did not take that as personally this time as she has in the past.  She is continuing to do some good things for herself and she is working full-time but she is aware of her limitations at work.  I continue to  encourage her to do good things for herself because I think that would make her feel more fulfilled in her life.  We did talk briefly about my retirement in March and she is hoping that she can get to the place where she might not feel like she needs therapy as often.  She really does not want to start with a new therapist and I explained to her that it was entirely up to her that we could do a transfer or she could see how she does and then if she needed therapy and she can get back in it and we talked about how to do that. Interventions: Cognitive Behavioral Therapy and Assertiveness/Communication,, problem solving, psychoeducation,  EMDR as indicated, Meditation and mindfulness,   Diagnosis:GAD (generalized anxiety disorder)  Obsessive-compulsive disorder, unspecified type  Plan: Client Abilities/Strengths  Intelligent, insightful, motivated  Client Treatment Preferences  Outpatient Individual therapy every other week  Client Statement of Needs   I feel better now, but still need some help with anxiety  Treatment Level  Outpatient Individual therapy  Symptoms  Frustration and anxiety related to providing oversight and caretaking to an aging, ailing, and dependent  parent.: No Description Entered (Status: improved). Hypervigilance (e.g., feeling constantly on edge,  experiencing concentration difficulties, having trouble falling or staying asleep, exhibiting a general  state of irritability).: No Description Entered (Status: improved). Motor tension (e.g., restlessness,  tiredness, shakiness, muscle  tension).: No Description Entered (Status: improved).  Problems Addressed  Anxiety, Phase Of Life Problems, Anxiety  Goals 1. Learn and implement coping skills that result in a reduction of anxiety  and worry, and improved daily functioning. Objective Learn and implement calming skills to reduce overall anxiety and manage anxiety symptoms. Target Date: 2026-08-09Frequency:  Weekly Progress: 50 Modality: individual  Related Interventions 1. Teach the client calming/relaxation skills (e.g., applied relaxation, progressive muscle  relaxation, cue controlled relaxation; mindful breathing; biofeedback) and how to discriminate  better between relaxation and tension; teach the client how to apply these skills to his/her daily  life (e.g., New Directions in Progressive Muscle Relaxation by Thornell Collier, and  Hazlett-Stevens; Treating Generalized Anxiety Disorder by Rygh and Red). Objective Identify, challenge, and replace biased, fearful self-talk with positive, realistic, and empowering selftalk. Target Date: 2025-05-26 Frequency: weekly Progress: 50 Modality: individual Related Interventions 1. Explore the client's schema and self-talk that mediate his/her fear response; assist him/her in  challenging the biases; replace the distorted messages with reality-based alternatives and  positive, realistic self-talk that will increase his/her self-confidence in coping with irrational  fears (see Cognitive Therapy of Anxiety Disorders by Gretta armin Mon). Objective Learn and implement problem-solving strategies for realistically addressing worries. Target Date: 2026-08-09Frequency: weekly Progress: 50 Modality: individual 2. Resolve conflicted feelings and adapt to the new life circumstances. Objective Apply problem-solving skills to current circumstances. Target Date: 2025-05-26 Frequency: weekly Progress: 30 Modality: individual Related Interventions 1. Teach the client problem-resolution skills (e.g., defining the problem clearly, brainstorming  multiple solutions, listing the pros and cons of each solution, seeking input from others,  selecting and implementing a plan of action, evaluating outcome, and readjusting plan as  necessary).   3. Stabilize anxiety level while increasing ability to function on a daily  basis. Diagnosis F33.1  Major  depressive disorder, moderate 300.02 (Generalized anxiety disorder) - Open - [Signifier: n/a]  Axis  none 309.28 (Adjustment disorder with mixed anxiety and depressed  mood) - Open - [Signifier: n/a]  Adjustment Disorder,  With Anxiety   Marital conflict  Major Depressive disorder, moderate  Conditions For Discharge Achievement of treatment goals and objectives.  The patient approved this plan.   Devanie Galanti G Jamella Grayer, LCSW

## 2024-08-19 ENCOUNTER — Other Ambulatory Visit: Payer: Self-pay

## 2024-08-20 ENCOUNTER — Other Ambulatory Visit: Payer: Self-pay

## 2024-08-21 ENCOUNTER — Other Ambulatory Visit: Payer: Self-pay

## 2024-08-21 MED ORDER — ATORVASTATIN CALCIUM 10 MG PO TABS
5.0000 mg | ORAL_TABLET | Freq: Every day | ORAL | 3 refills | Status: AC
  Filled 2024-08-21: qty 45, 90d supply, fill #0
  Filled 2024-11-04: qty 45, 90d supply, fill #1

## 2024-08-22 ENCOUNTER — Ambulatory Visit: Admitting: Psychology

## 2024-08-22 DIAGNOSIS — Z1331 Encounter for screening for depression: Secondary | ICD-10-CM | POA: Diagnosis not present

## 2024-08-22 DIAGNOSIS — G90A Postural orthostatic tachycardia syndrome (POTS): Secondary | ICD-10-CM | POA: Diagnosis not present

## 2024-08-22 DIAGNOSIS — G43409 Hemiplegic migraine, not intractable, without status migrainosus: Secondary | ICD-10-CM | POA: Diagnosis not present

## 2024-08-22 DIAGNOSIS — I6930 Unspecified sequelae of cerebral infarction: Secondary | ICD-10-CM | POA: Diagnosis not present

## 2024-08-22 NOTE — Progress Notes (Signed)
 [facility]: Stroke Clinic Department of Neurology Division of Vascular and Stroke Neurology  Return Patient Visit Referring Provider: Lanny Brought, MD  08/22/2024  History of Present Illness Faith Jordan is a 28 y.o. right  handed female with a past medical history significant for familiar hemiplegic migraine, POTS, depression, bilateral intracranial calcification who presents to the stroke clinic today for consultation of a recent ischemic stroke. She is here with her mother.  Patient worked as a engineer, civil (consulting) in the ED, she had familiar hemiplegic migraine ( mom and her sibling all have it) which occurred probably once per month or less, typically involving left facial droop with headache, lasting about 4-6 hours. Patient is on several migraine headache medication such topiramate , nurtec and emgality  pen, she used to take propanolol but stopped.   On March 21st morning, she woke up with left dense hemiparesis without numbness, and dysarthria. 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  She had worked up locally, echo is normal including TEE without PFO , heart monitor is unremarkable except a few PAC and PVC (for 2 weeks), hypercoaguable lab is negative. She was placed on high dose lipitor ( down to 40mg  now) and plavix .  She has recovered from the stroke, but she still feel clumsy and spasm, especially with left leg which she took baclofen .   Patient denises smoking, or drink or drug use. She was on BCP for years mainly for migraine headache. 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 She endorse that she had migraine very young age, heat and period can make it worse. She also had POTS and depression which she took prozac  for years. She also had incidental finding concerns for Fahr diease ( bilateral subcortical intracranial calcification), they  brought a CD in 2006 (she was 9) which already documented calcification.  The only familiar disease is hemiplegic migraine ( mom has it).   Interval history:   Patient completed all workup so far but at this moment, it appears that she may have extracardiac shunt which needs to be proved. Patient states she is still emotional about the event, she has return to work in the ED but not night shift. She has increase dose of fluoxetine  to 60mg . Patient has voice some issue with decreased appetite but weight appears to be stable. She is compliant to medication and keep good hydration. She still has headache but did not get worse. She has get app with movement disorder clinic.   Interval history 2. Patient is doing well. Went back to her job as engineer, civil (consulting) in the ED. She fel that she is capable although she has fatigue sometimes, she is no longer doing the night shift. She still has spasm with left leg and she can lose balance. She has no trouble with walking. She is compliant to medication but her migraine headache is getting better and she was able to come off several medications. Overall, she is feeling better but still worry about stroke.  Review of Systems  Reviewed and positive for occasional fatigue.  Allergies  Allergen Reactions  . Nsaids (Non-Steroidal Anti-Inflammatory Drug) Rash    Blisters on arms  . Sulfa (Sulfonamide Antibiotics) Rash    Facial rash at [age] year old  . Ibuprofen Rash    Facial swelling  . Sulfa (Sulfonamide Antibiotics) Rash  Facial swelling     Current Outpatient Medications  Medication Sig Dispense Refill  . atorvastatin  (LIPITOR ) 10 MG tablet Take 0.5 tablets (5 mg total) by mouth at bedtime 45 tablet 3  . baclofen  (LIORESAL ) 10 MG tablet Take 5 mg by mouth 3 (three) times daily 5mg  in am, 5mg  in afternoon as needed, and 10mg  at night    . clopidogreL  (PLAVIX ) 75 mg tablet Take 75 mg by mouth once daily    . cyanocobalamin  (VITAMIN B12) 1000 MCG tablet Take 500  mcg by mouth once daily    . EMGALITY  PEN 120 mg/mL PnIj Inject 120 mg subcutaneously every 30 (thirty) days    . FLUoxetine  60 mg Take 60 mg by mouth once daily    . LORazepam  (ATIVAN ) 1 MG tablet Take 0.5 mg by mouth at bedtime as needed    . NURTEC ODT  75 mg disintegrating tablet Take 75 mg by mouth once    . topiramate  (TOPAMAX ) 50 MG tablet Take 50 mg by mouth once daily    . traZODone  (DESYREL ) 50 MG tablet Take 100 mg by mouth at bedtime    . ubrogepant  (UBRELVY ) 100 mg Tab Take 100 mg by mouth once as needed    . EPIPEN  2-PAK 0.3 mg/0.3 mL (1:1,000) pen injector Reported on 12/22/2015     . MAGNESIUM  CITRATE ORAL Take by mouth. (Patient not taking: Reported on 07/09/2024)    . propranolol  (INDERAL ) 20 MG tablet TAKE 1 TABLET (20 MG TOTAL) BY MOUTH ONCE DAILY. (Patient not taking: Reported on 07/09/2024) 90 tablet 4   No current facility-administered medications for this visit.     Past Medical History: Depression   Past Surgical History:  Procedure Laterality Date  . ADENOIDECTOMY    . MASTECTOMY PARTIAL / LUMPECTOMY Right   . TONSILLECTOMY    . wisdom teeth removal      Social History   Socioeconomic History  . Marital status: Single  Tobacco Use  . Smoking status: Never  . Smokeless tobacco: Never  Substance and Sexual Activity  . Alcohol use: No  . Drug use: No  Social History Narrative   ** Merged History Encounter **       Social Drivers of Health   Food Insecurity: No Food Insecurity (01/09/2024)   Received from [facility]   Hunger Vital Sign   . Within the past 12 months, you worried that your food would run out before you got the money to buy more.: Never true   . Within the past 12 months, the food you bought just didn't last and you didn't have money to get more.: Never true  Transportation Needs: No Transportation Needs (01/09/2024)   Received from [facility] - Transportation   . Lack of Transportation (Medical): No   . Lack of  Transportation (Non-Medical): No    Family History  Problem Relation Name Age of Onset  . Irregular Heart Beat (Arrhythmia) Mother  85       PAC's  . Migraines Mother    . Thyroid  disease Mother      PHQ 2/9 last 3 flowsheet values     04/11/2024    9:53 AM 05/30/2024    8:52 AM 08/22/2024    9:04 AM  PHQ-2/9 Depression Screening   Little interest or pleasure in doing things 1 0 0  Feeling down, depressed, or hopeless 1 1 1   Patient Health Questionnaire-2 Score 2 1 1   Trouble falling or staying asleep,  or sleeping too much 3 1 0  Feeling tired or having little energy 3 1 1   Poor appetite or overeating 3 3 1   Feeling bad about yourself - or that you are a failure or have let yourself or your family down 3 2 0  Trouble concentrating on things, such as reading the newspaper or watching television 1 0 0  Moving or speaking so slowly that other people could have noticed? Or the opposite - being so fidgety or restless that you have been moving around a lot more than usual. 0 0 0  Thoughts that you would be better off dead or hurting yourself in some way 0 0 0  How difficult have these problems made it for you to do your work, take care of things at home, or get along with other people? Extremely difficult Very difficult Somewhat difficult  Patient Health Questionnaire-9 Score 15 8 3      Depression Severity and Treatment Recommendations:  0-4= None  5-9= Mild / Treatment: Support, educate to call if worse; return in one month  10-14= Moderate / Treatment: Support, watchful waiting; Antidepressant or Psychotherapy  15-19= Moderately severe / Treatment: Antidepressant OR Psychotherapy  >= 20 = Major depression, severe / Antidepressant AND Psychotherapy  Physical Exam Vitals:   08/22/24 0900  BP: 107/73  BP Location: Left upper arm  Patient Position: Sitting  BP Cuff Size: Adult  Pulse: 88  Weight: 64.1 kg (141 lb 5 oz)  Height: 170.2 cm (5' 7)     General Exam Alert, acute  distress and very emotional. Oropharynx is without inflammation. No cervical lymphadenopathy or thyromegaly noted. No carotid bruits appreciated. Lungs clear to ausculation without wheezing or rhonchi. Normal sinus rhythm without murmurs or gallops. Abdomen non-tender to palpation. Extremities without edema. Peripheral pulses are palpated.   Neurological Examination  Mental Status: Alert and oriented to self, location, date. Speech was clear and fluent and could name, repeat and follow commands. Able to count month forward and backward without difficulty.   Cranial Nerves:  Pupils were equal and reacts to direct stimulation bilaterally. Visual fields were full to confrontation. Visual acuity intact. Extraocular movements were intact. No nystagmus noted. Facial sensation was intact. No facial asymmetry was noted. Hearing was intact to finger rub bilaterally. Palate elevated symmetrically. Should shrug was normal and tongue was midline.  Motor: slightly deficit with fine motor skill,slow with finger taping. Slightly weak with left leg, trouble with walking and standing with toe.  Sensory: Intact to light touch, pin prick, temperature perception, joint position, vibration sense in all four extremities.   Coordination: Intact to finger-to-nose, heel-to-shin, and rapid alternating movements.   Reflexes: Reflexes were brisk on left side. Induced sustained clonus with left ankle reflex.  Station and Gait: Able to stand without assistance. Trouble with walk straight line and walk on the toes.  NIHSS: 1a  Level of consciousness: 0=alert; keenly responsive  1b. LOC questions:  0=Performs both tasks correctly  1c. LOC commands: 0=Performs both tasks correctly  2.  Best Gaze: 0=normal  3. Visual: 0=No visual loss  4. Facial Palsy: 0=Normal symmetric movement  5a. Motor left arm: 0=No drift, limb holds 90 (or 45) degrees for full 10 seconds  5b.  Motor right arm: 0=No drift, limb holds 90 (or 45)  degrees for full 10 seconds  6a. Motor left leg: 0=No drift, limb holds 90 (or 45) degrees for full 10 seconds  6b  Motor right leg:  0=No drift, limb holds  90 (or 45) degrees for full 10 seconds  7. Limb Ataxia: 0=Absent  8.  Sensory: 0=Normal; no sensory loss  9. Best Language:  0=No aphasia, normal  10. Dysarthria: 0=Normal  11. Extinction and Inattention: 0=No abnormality   Total:   0   mRS: 1 - No significant disability despite symptoms; able to carry out all usual duties and activities  Barthel Index: Feeding: 10 - Independent  Bathing: 5 - Independent  Grooming: 5 - Independent  Dressing: 10 - Independent  Bowel Control: 10 - Continent  Bladder Control: 10 - Continent  Toilet Use: 10 - Independent  Transfers: 15 - Independent  Mobility on Level Surfaces: 15 - Independent > 50 yards  Stairs: 10 - Independent  Total:   100   Impression and Plan  History of stroke with minor residual deficit Mild post-stroke lower-limb spasticity POTS Familiar Hemiplegic migraine (better) Post-stroke depression  Positive PFO  - Overall, I think this is still a PFO related stroke but it is low risk PFO. The hole is not open all the time,. We only detect on the TTE and but not on TEE and TCD. We also rule out pulmonary shunt. Interestingly her migraine headache is getting better. Other rare cause of stroke were all ruled out. I would not recommend to close PFO. We can do another cardiac MRI and I would even suggest she exercise that day before the test to see if the PFO is open. -  plavix  platelet inhibition test shows plavix  is working, but will switch to aspirin 81mg  after one year. - continue lipitor  to 5 mg qhs as LDL is 70. The goal is not to reduce LDL rather improve the endothelial environment.  - continue baclofen  to 5, and 10mg . - continue for prozac  ( at 60mg ) and trazodone  for depression, but I suggest she work with psychiatrist to decrease dose fluoxetine  to 40mg  as it also reduce  the tone. - she needs to keep well hydrated. - she need to do slow stretch with calf muscle to decrease tone and clonus. - she could be a restoration study subject. - follow up in 6 months and we may do another cardiac MRI   6. Bilateral subcortical intracranial calcification - her first scan in 2006 (age of 37) already document calcification and now gets worse. It is not clear to me if this is familiar or not. She has no symptoms but worry about it. - she has appointment with movement disorder clinic next month - a few labs associated calcium  metabolism are all normal.     Additional Notes: we also discussed with warning signs /symptoms of stroke including: sudden onset of numbness or weakness, sudden loss of speech, sudden difficulty with vision or double vision, abrupt dizziness or trouble with walking, difficulty with coordination, or new onset of severe headache. They verbalize the need to call 911 immediately should they experience any of the above signs or symptoms.   Attestation Statement:   I personally performed the service. I spent 50 minutes on his care today including both face to face and non face-to-face time.  AUTUMN MATTOCK, MD  Lemond Mattock MD MS Division of Stroke and Vascular Neurology Department of Neurology 08/22/2024 9:50 AM

## 2024-08-26 ENCOUNTER — Other Ambulatory Visit: Payer: Self-pay

## 2024-09-03 ENCOUNTER — Telehealth: Payer: Self-pay | Admitting: Family Medicine

## 2024-09-03 NOTE — Telephone Encounter (Signed)
 Submitted auth renewal request via CMM, status is pending. Key: [id number]

## 2024-09-04 ENCOUNTER — Other Ambulatory Visit: Payer: Self-pay

## 2024-09-04 ENCOUNTER — Other Ambulatory Visit (HOSPITAL_COMMUNITY): Payer: Self-pay

## 2024-09-05 ENCOUNTER — Telehealth: Admitting: Adult Health

## 2024-09-05 ENCOUNTER — Encounter: Payer: Self-pay | Admitting: Adult Health

## 2024-09-05 ENCOUNTER — Other Ambulatory Visit: Payer: Self-pay

## 2024-09-05 ENCOUNTER — Ambulatory Visit (INDEPENDENT_AMBULATORY_CARE_PROVIDER_SITE_OTHER): Admitting: Psychology

## 2024-09-05 DIAGNOSIS — F411 Generalized anxiety disorder: Secondary | ICD-10-CM

## 2024-09-05 DIAGNOSIS — F410 Panic disorder [episodic paroxysmal anxiety] without agoraphobia: Secondary | ICD-10-CM | POA: Diagnosis not present

## 2024-09-05 DIAGNOSIS — F429 Obsessive-compulsive disorder, unspecified: Secondary | ICD-10-CM | POA: Diagnosis not present

## 2024-09-05 DIAGNOSIS — F330 Major depressive disorder, recurrent, mild: Secondary | ICD-10-CM | POA: Diagnosis not present

## 2024-09-05 DIAGNOSIS — G4700 Insomnia, unspecified: Secondary | ICD-10-CM

## 2024-09-05 MED ORDER — FLUOXETINE HCL 40 MG PO CAPS
40.0000 mg | ORAL_CAPSULE | Freq: Every day | ORAL | 1 refills | Status: DC
  Filled 2024-09-05: qty 90, 90d supply, fill #0

## 2024-09-05 MED ORDER — BUSPIRONE HCL 10 MG PO TABS
ORAL_TABLET | ORAL | 2 refills | Status: DC
  Filled 2024-09-05: qty 90, 30d supply, fill #0
  Filled 2024-10-08: qty 90, 30d supply, fill #1
  Filled 2024-11-04: qty 90, 30d supply, fill #2

## 2024-09-05 NOTE — Progress Notes (Unsigned)
                edge,  experiencing concentration difficulties, having trouble falling or staying asleep, exhibiting a general  state of irritability).: No Description Entered (Status: improved). Motor tension (e.g., restlessness,  tiredness, shakiness, muscle tension).: No Description Entered (Status: improved).  Problems Addressed  Anxiety, Phase Of Life Problems, Anxiety  Goals 1. Learn and implement coping skills that result in a reduction of anxiety  and worry, and improved daily functioning. Objective Learn  and implement calming skills to reduce overall anxiety and manage anxiety symptoms. Target Date: 2025-08-09Frequency: Weekly Progress: 40 Modality: individual  Related Interventions 1. Teach the client calming/relaxation skills (e.g., applied relaxation, progressive muscle  relaxation, cue controlled relaxation; mindful breathing; biofeedback) and how to discriminate  better between relaxation and tension; teach the client how to apply these skills to his/her daily  life (e.g., New Directions in Progressive Muscle Relaxation by Marcelyn Ditty, and  Hazlett-Stevens; Treating Generalized Anxiety Disorder by Rygh and Ida Rogue). Objective Identify, challenge, and replace biased, fearful self-talk with positive, realistic, and empowering selftalk. Target Date: 2024-05-26 Frequency: weekly Progress: 30 Modality: individual Related Interventions 1. Explore the client's schema and self-talk that mediate his/her fear response; assist him/her in  challenging the biases; replace the distorted messages with reality-based alternatives and  positive, realistic self-talk that will increase his/her self-confidence in coping with irrational  fears (see Cognitive Therapy of Anxiety Disorders by Laurence Slate). Objective Learn and implement problem-solving strategies for realistically addressing worries. Target Date: 2025-08-09Frequency: weekly Progress: 40 Modality: individual 2. Resolve conflicted feelings and adapt to the new life circumstances. Objective Apply problem-solving skills to current circumstances. Target Date: 2024-05-26 Frequency: weekly Progress: 20 Modality: individual Related Interventions 1. Teach the client problem-resolution skills (e.g., defining the problem clearly, brainstorming  multiple solutions, listing the pros and cons of each solution, seeking input from others,  selecting and implementing a plan of action, evaluating outcome, and readjusting plan as   necessary).   3. Stabilize anxiety level while increasing ability to function on a daily  basis. Diagnosis F33.1  Major depressive disorder, moderate 300.02 (Generalized anxiety disorder) - Open - [Signifier: n/a]  Axis  none 309.28 (Adjustment disorder with mixed anxiety and depressed  mood) - Open - [Signifier: n/a]  Adjustment Disorder,  With Anxiety   Marital conflict  Major Depressive disorder, moderate  Conditions For Discharge Achievement of treatment goals and objectives.  The patient approved this plan.   Deonna Krummel G Ethridge Sollenberger, LCSW

## 2024-09-05 NOTE — Progress Notes (Signed)
 Faith Jordan [id number] 03-Jun-1996 27 y.o.  Virtual Visit via Video Note  I connected with pt @ on 09/05/24 at  2:30 PM EST by a video enabled telemedicine application and verified that I am speaking with the correct person using two identifiers.   I discussed the limitations of evaluation and management by telemedicine and the availability of in person appointments. The patient expressed understanding and agreed to proceed.  I discussed the assessment and treatment plan with the patient. The patient was provided an opportunity to ask questions and all were answered. The patient agreed with the plan and demonstrated an understanding of the instructions.   The patient was advised to call back or seek an in-person evaluation if the symptoms worsen or if the condition fails to improve as anticipated.  I provided 25 minutes of non-face-to-face time during this encounter.  The patient was located at home.  The provider was located at [facility].   Faith LOISE Sayers, NP   Subjective:   Patient ID:  Faith Jordan is a 28 y.o. (DOB 1996/04/17) female.  Chief Complaint: No chief complaint on file.   HPI Faith Jordan presents for follow-up of GAD, OCD, panic disorder, and insomnia.  Referred by therapist - Bambi Cottle.  Describes mood today as alright. Pleasant. Reports tearfulness. Mood symptoms - reports anxiety, depression and irritability. Reports improved interest and motivation. Reports struggling with mood - not managing emotions as well since her stroke. Reports she is uncertain if being off birth control has added to mood symptoms, but can no longer take birth control. Reports a panic attack at home a few weeks ago. Reports some worry - having another stroke. Denies rumination. Reports over thinking. Denies obsessive thoughts and acts. Reports mood as improved. Stating I feel like I'm doing better overall. Feels like medications are helpful, but would like to  consider other options. Taking medications as prescribed. Spoke with therapist today and they discussed some strategies for mood symptoms. Energy levels improved. 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   Enjoys some usual interests and activities. Divorced. Lives alone. Spending time with family.  Appetite decreased. Weight loss - 20+ pounds - 136 pounds - 67. Sleeps well most nights. Averages 8 to 10 hours. Reports napping on her off days.  Reports focus and concentration stable. Reports word finding issues. Completing tasks. Managing aspects of household. Reports she has returned to work - full-time - 12 hours a week. Denies SI or HI.  Denies AH or VH. Denies self harm. Denies substance use.  Previous medication trials:  Cymbalta , Gabapentin , Trazadone, Hydroxyzine , Propranolol , Wellbutrin , Lorazepam , Lexapro , Prozac , Zoloft , Prazosin , Effexor  Review of Systems:  Review of Systems  Musculoskeletal:  Negative for gait problem.  Neurological:  Negative for tremors.  Psychiatric/Behavioral:         Please refer to HPI    Medications: I have reviewed the patient's current medications.  Current Outpatient Medications  Medication Sig Dispense Refill   acetaminophen  (TYLENOL ) 325 MG tablet Take 2 tablets (650 mg total) by mouth every 6 (six) hours as needed for mild pain (pain score 1-3), fever or headache.     atorvastatin  (LIPITOR ) 10 MG tablet Take 0.5 tablets (5 mg total) by mouth at bedtime. 45 tablet 3   Atorvastatin  Calcium  (LIPITOR  PO) Take 5 mg by mouth daily.     baclofen  (LIORESAL ) 10 MG tablet Take 1 tablet (10 mg total) by mouth 3 (three) times daily. (Patient taking differently: Take 10 mg by mouth in  the morning and at bedtime. Takes 5 mg in am and afternoon and 10 mg at bedtime) 270 each 3   botulinum toxin Type A  (BOTOX ) 200 units injection Provider to inject 155 units into the muscles of the head and neck every 12 weeks. Discard remainder. 1 each 3   Cholecalciferol (VITAMIN D3) 50 MCG (2000  UT) capsule Take 2,000 Units by mouth in the morning.     clopidogrel  (PLAVIX ) 75 MG tablet Take 1 tablet (75 mg total) by mouth daily. 90 tablet 1   Coenzyme Q10 (COQ-10 PO) Take 1 tablet by mouth in the morning.     FLUoxetine  (PROZAC ) 20 MG capsule Take 3 capsules (60 mg total) by mouth at bedtime. 90 capsule 0   Galcanezumab -gnlm (EMGALITY ) 120 MG/ML SOAJ Inject 1 Dose into the skin every 30 (thirty) days.     Galcanezumab -gnlm (EMGALITY ) 120 MG/ML SOAJ Inject 120 mg into the skin every 30 (thirty) days. 3 mL 0   LORazepam  (ATIVAN ) 1 MG tablet Take 1 tablet (1 mg total) by mouth 2 (two) times daily. 60 tablet 2   ondansetron  (ZOFRAN -ODT) 4 MG disintegrating tablet Take 1 tablet (4 mg total) by mouth every 8 (eight) hours as needed for nausea or vomiting. 20 tablet 0   promethazine  (PHENERGAN ) 12.5 MG tablet Take 1 tablet (12.5 mg total) by mouth every 6 (six) hours as needed for nausea or vomiting. 30 tablet 2   Rimegepant Sulfate  (NURTEC) 75 MG TBDP Take 1 tablet by mouth as needed.     topiramate  (TOPAMAX ) 50 MG tablet Take 1 tablet (50 mg total) by mouth daily. May also take 1 tablet (50 mg total) daily as needed (daily during menstrual cycle). 180 tablet 3   traZODone  (DESYREL ) 100 MG tablet Take 1 tablet (100 mg total) by mouth at bedtime. 90 tablet 0   Ubrogepant  (UBRELVY ) 100 MG TABS Take 1 tablet onset migraine may repeat in 2 hours if needed. (Max 2 tabs/ 24 hrs) 16 tablet 11   vitamin B-12 (CYANOCOBALAMIN ) 500 MCG tablet Take 500 mcg by mouth daily.     No current facility-administered medications for this visit.    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, blistering peeling skin   Nsaids Dermatitis    Reaction similar to Stephens-Johnson, blistering peeling rash  Blisters on arms   Sulfa Antibiotics Swelling and Dermatitis    Facial swelling  Facial rash at [age] year  old    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   Acute flank pain 06/02/2022   Angio-edema 04/27/2016   Angioedema    Aquagenic angio-edema-urticaria    Asthma    no problems /not used recently   Dysautonomia (HCC)    Dysrhythmia    sinus tachycardia   Fibroid    right breast, adenoma   Headache(784.0)    History of COVID-19 11/21/2020   Hives 12/22/2015   Pneumonia    hx  6th grade   Sensation of fullness in both ears 02/02/2023   Snoring 02/11/2022   Urinary frequency 02/12/2019   Vaginal yeast infection 06/02/2022   Vasculitis     Family History  Problem Relation Age of Onset   Depression Mother    Migraines Mother    Narcolepsy Mother    Hypothyroidism Mother    Breast cancer Maternal Grandmother 19   Myasthenia gravis  Maternal Grandfather    Asthma Other    Cancer Other    Epilepsy Other    Allergic rhinitis Neg Hx    Angioedema Neg Hx    Atopy Neg Hx    Eczema Neg Hx    Immunodeficiency Neg Hx    Urticaria Neg Hx    Stroke Neg Hx     Social History   Socioeconomic History   Marital status: Significant Other    Spouse name: Not on file   Number of children: Not on file   Years of education: Not on file   Highest education level: Bachelor's degree (e.g., BA, AB, BS)  Occupational History   Not on file  Tobacco Use   Smoking status: Never   Smokeless tobacco: Never  Vaping Use   Vaping status: Never Used  Substance and Sexual Activity   Alcohol use: No   Drug use: No   Sexual activity: Yes  Other Topics Concern   Not on file  Social History Narrative   Lives with fiance   R handed   Caffeine : 1 drink a day   Social Drivers of Corporate Investment Banker Strain: Not on file  Food Insecurity: No Food Insecurity (01/09/2024)   Hunger Vital Sign    Worried About Running Out of Food in the Last Year: Never true    Ran Out of Food in the Last Year: Never true  Transportation Needs: No  Transportation Needs (01/09/2024)   PRAPARE - Administrator, Civil Service (Medical): No    Lack of Transportation (Non-Medical): No  Physical Activity: Not on file  Stress: Not on file  Social Connections: Not on file  Intimate Partner Violence: Not At Risk (01/09/2024)   Humiliation, Afraid, Rape, and Kick questionnaire    Fear of Current or Ex-Partner: No    Emotionally Abused: No    Physically Abused: No    Sexually Abused: No    Past Medical History, Surgical history, Social history, and Family history were reviewed and updated as appropriate.   Please see review of systems for further details on the patient's review from today.   Objective:   Physical Exam:  LMP 08/02/2024 (Exact Date)   Physical Exam Constitutional:      General: She is not in acute distress. Musculoskeletal:        General: No deformity.  Neurological:     Mental Status: She is alert and oriented to person, place, and time.     Coordination: Coordination normal.  Psychiatric:        Attention and Perception: Attention and perception normal. She does not perceive auditory or visual hallucinations.        Mood and Affect: Mood normal. Mood is not anxious or depressed. Affect is not labile, blunt, angry or inappropriate.        Speech: Speech normal.        Behavior: Behavior normal.        Thought Content: Thought content normal. Thought content is not paranoid or delusional. Thought content does not include homicidal or suicidal ideation. Thought content does not include homicidal or suicidal plan.        Cognition and Memory: Cognition and memory normal.        Judgment: Judgment normal.     Comments: Insight intact     Lab Review:     Component Value Date/Time   NA 139 08/09/2024 0934   K 4.0 08/09/2024 0934   CL 103  08/09/2024 0934   CO2 20 08/09/2024 0934   GLUCOSE 78 08/09/2024 0934   GLUCOSE 122 (H) 02/24/2024 1521   BUN 10 08/09/2024 0934   CREATININE 0.87 08/09/2024 0934    CALCIUM  9.5 08/09/2024 0934   CALCIUM  8.9 01/07/2024 2231   PROT 7.6 08/09/2024 0934   ALBUMIN 5.0 08/09/2024 0934   AST 14 08/09/2024 0934   ALT 9 08/09/2024 0934   ALKPHOS 118 (H) 08/09/2024 0934   BILITOT 0.5 08/09/2024 0934   GFRNONAA >60 02/24/2024 1515   GFRAA >60 12/01/2017 1018       Component Value Date/Time   WBC 6.1 08/09/2024 0934   WBC 9.3 02/24/2024 1515   RBC 5.06 08/09/2024 0934   RBC 4.89 02/24/2024 1515   HGB 14.8 08/09/2024 0934   HCT 45.8 08/09/2024 0934   PLT 300 08/09/2024 0934   MCV 91 08/09/2024 0934   MCH 29.2 08/09/2024 0934   MCH 29.0 02/24/2024 1515   MCHC 32.3 08/09/2024 0934   MCHC 33.6 02/24/2024 1515   RDW 12.8 08/09/2024 0934   LYMPHSABS 1.7 08/09/2024 0934   MONOABS 0.8 02/24/2024 1515   EOSABS 0.0 08/09/2024 0934   BASOSABS 0.0 08/09/2024 0934    No results found for: POCLITH, LITHIUM   No results found for: PHENYTOIN, PHENOBARB, VALPROATE, CBMZ   .res Assessment: Plan:    Plan:  PDMP reviewed  Trazodone  100mg  at hs Ativan  1mg  BID prn anxiety   Decreased Prozac  60mg  to 40mg  daily for anxiety and depression Add Buspar  10mg  TID - 1/2 tablet TID x 7 days, then one tablet TID.  Genesight testing on file - reviewed.  RTC 4 weeks  25 minutes spent dedicated to the care of this patient on the date of this encounter to include pre-visit review of records, ordering of medication, post visit documentation, and face-to-face time with the patient discussing GAD, OCD, panic disorder and insomnia. Discussed continuing current medication regimen.  Patient advised to contact office with any questions, adverse effects, or acute worsening in signs and symptoms.  Discussed potential benefits, risk, and side effects of benzodiazepines to include potential risk of tolerance and dependence, as well as possible drowsiness.  Advised patient not to drive if experiencing drowsiness and to take lowest possible effective dose to minimize  risk of dependence and tolerance.   There are no diagnoses linked to this encounter.   Please see After Visit Summary for patient specific instructions.  Future Appointments  Date Time Provider Department Center  09/05/2024  2:30 PM Aigner Horseman, Faith Mattocks, NP CP-CP None  09/07/2024  9:30 AM Celestia Rima, NP AGI[facility]AG[facility] None  09/26/2024  3:30 PM Cary, Amy, NP GNA[facility]NA None  09/27/2024  2:00 PM Cottle, Bambi G, LCSW [facility] None  10/02/2024 11:30 AM Kirsteins, Prentice BRAVO, MD CPR-PRMA CPR  11/06/2024  1:00 PM Kinzi Frediani Nattalie, NP CP-CP None  02/26/2025  4:00 PM Penumalli, Eduard SAUNDERS, MD [facility]NA[facility]NA None    No orders of the defined types were placed in this encounter.     -------------------------------

## 2024-09-06 ENCOUNTER — Other Ambulatory Visit: Payer: Self-pay

## 2024-09-06 NOTE — Telephone Encounter (Signed)
 Auth was approved, she will continue to fill through [facility].  Auth#: [id number] (09/05/24-09/04/25)

## 2024-09-06 NOTE — Progress Notes (Signed)
 09/07/2024 Faith Jordan [id number] August 19, 1996  Gastroenterology Office Note    Referring Provider: Gretta Comer POUR, NP Primary Care Physician:  Gretta Comer POUR, NP  Primary GI Provider: Jinny Carmine, MD    Chief Complaint   Chief Complaint  Patient presents with   New Patient (Initial Visit)    Chronic nausea-x one month-after the stroke she started having nausea-      History of Present Illness   Faith Jordan is a 28 y.o. 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  Patient has history of stroke with residual left-sided weakness, but is functioning well.  Patient reports that about a month after having her stroke she started having nausea and felt like she had a stomach bug.  She has episodes off and on where she feels this way, but overall she has nausea every day.  Her eating habits have changed and she just does not feel hungry.  Some days she will drink protein shakes. She reports she weighed 170 pounds in March 2025 prior to the stroke, and now she is at 130.  Zofran  has helped with the nausea.  Denies dysphagia, vomiting, heartburn or reflux.   She states that she used to be regular with bowel movements but now she may have some loose stools and then may only have a bowel movement 2-3 times a week. States she has history of having hemorrhoids and rarely may see a scant amount of blood on the tissue when she wipes.  She takes Colace occasionally. Denies rectal pain or itching.  She drinks about 40 ounces of water daily.  Denies melena.   Patient seen by PCP on 08/09/2024 with complaints of nausea, weight loss, loss of appetite.  Zofran  has not helped. She is alternating between diarrhea and constipation.   06/26/2024 HIDA: Gallbladder ejection fraction normal.    06/05/2024 US  RUQ: No acute sonographic abnormality.    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   Acute flank pain 06/02/2022   Angio-edema 04/27/2016   Angioedema    Aquagenic angio-edema-urticaria    Asthma    no problems /not used recently   Dysautonomia [facility])    Dysrhythmia    sinus tachycardia   Fibroid    right breast, adenoma   Headache(784.0)    History of COVID-19 11/21/2020   Hives 12/22/2015   Pneumonia    hx  6th grade   Sensation of fullness in both ears 02/02/2023   Snoring 02/11/2022   Urinary frequency 02/12/2019   Vaginal yeast infection 06/02/2022   Vasculitis     Past Surgical History:  Procedure Laterality Date   ADENOIDECTOMY     BREAST LUMPECTOMY Right    BREAST LUMPECTOMY Right    MASS EXCISION Right 10/07/2014   Procedure: EXCISION OF RIGHT BREAST MASS;  Surgeon: Krystal Russell, MD;  Location: [facility] OR;  Service: General;  Laterality: Right;   TONSILLECTOMY AND ADENOIDECTOMY     TOOTH EXTRACTION     TRANSESOPHAGEAL ECHOCARDIOGRAM (CATH LAB) N/A 01/10/2024   Procedure: TRANSESOPHAGEAL ECHOCARDIOGRAM;  Surgeon: Jeffrie Oneil BROCKS, MD;  Location: [facility] INVASIVE CV LAB;  Service: Cardiovascular;  Laterality: N/A;    Current Outpatient Medications  Medication Sig Dispense Refill   acetaminophen  (TYLENOL ) 325 MG tablet  Take 2 tablets (650 mg total) by mouth every 6 (six) hours as needed for mild pain (pain score 1-3), fever or headache.     atorvastatin  (LIPITOR ) 10 MG tablet Take 0.5 tablets (5 mg total) by mouth at bedtime. 45 tablet 3   baclofen  (LIORESAL ) 10 MG tablet Take 1 tablet (10 mg total) by mouth 3 (three) times daily. (Patient taking differently: Take 10 mg by mouth in the morning and at bedtime. Takes 5 mg in am and afternoon and 10 mg at bedtime) 270 each 3   botulinum toxin Type A  (BOTOX ) 200 units injection Provider to inject 155 units into the muscles of the head and neck every 12 weeks.  Discard remainder. 1 each 3   busPIRone  (BUSPAR ) 10 MG tablet Take 0.5 tablets (5 mg total) by mouth 3 (three) times daily for 7 days, THEN 1 tablet (10 mg total) 3 (three) times daily 90 tablet 2   Cholecalciferol (VITAMIN D3) 50 MCG (2000 UT) capsule Take 2,000 Units by mouth in the morning.     clopidogrel  (PLAVIX ) 75 MG tablet Take 1 tablet (75 mg total) by mouth daily. 90 tablet 1   Coenzyme Q10 (COQ-10 PO) Take 1 tablet by mouth in the morning.     FLUoxetine  (PROZAC ) 40 MG capsule Take 1 capsule (40 mg total) by mouth at bedtime. 90 capsule 1   Galcanezumab -gnlm (EMGALITY ) 120 MG/ML SOAJ Inject 1 Dose into the skin every 30 (thirty) days.     LORazepam  (ATIVAN ) 1 MG tablet Take 1 tablet (1 mg total) by mouth 2 (two) times daily. 60 tablet 2   ondansetron  (ZOFRAN -ODT) 4 MG disintegrating tablet Take 1 tablet (4 mg total) by mouth every 8 (eight) hours as needed for nausea or vomiting. 20 tablet 0   pantoprazole  (PROTONIX ) 40 MG tablet Take 1 tablet (40 mg total) by mouth daily. 90 tablet 3   promethazine  (PHENERGAN ) 12.5 MG tablet Take 1 tablet (12.5 mg total) by mouth every 6 (six) hours as needed for nausea or vomiting. 30 tablet 2   Rimegepant Sulfate  (NURTEC) 75 MG TBDP Take 1 tablet by mouth as needed.     topiramate  (TOPAMAX ) 50 MG tablet Take 1 tablet (50 mg total) by mouth daily. May also take 1 tablet (50 mg total) daily as needed (daily during menstrual cycle). 180 tablet 3   traZODone  (DESYREL ) 100 MG tablet Take 1 tablet (100 mg total) by mouth at bedtime. 90 tablet 0   Ubrogepant  (UBRELVY ) 100 MG TABS Take 1 tablet onset migraine may repeat in 2 hours if needed. (Max 2 tabs/ 24 hrs) 16 tablet 11   vitamin B-12 (CYANOCOBALAMIN ) 500 MCG tablet Take 500 mcg by mouth daily.     No current facility-administered medications for this visit.    Allergies as of 09/07/2024 - Review Complete 09/07/2024  Allergen Reaction Noted   Advil [ibuprofen] Dermatitis 03/31/2013   Nsaids  Dermatitis 03/31/2013   Sulfa antibiotics Swelling and Dermatitis 11/26/2012    Family History  Problem Relation Age of Onset   Depression Mother    Migraines Mother    Narcolepsy Mother    Hypothyroidism Mother    Breast cancer Maternal Grandmother 79   Myasthenia gravis Maternal Grandfather    Asthma Other    Cancer Other    Epilepsy Other    Allergic rhinitis Neg Hx    Angioedema Neg Hx    Atopy Neg Hx    Eczema Neg Hx    Immunodeficiency Neg Hx  Urticaria Neg Hx    Stroke Neg Hx     Social History   Socioeconomic History   Marital status: Significant Other    Spouse name: Not on file   Number of children: Not on file   Years of education: Not on file   Highest education level: Bachelor's degree (e.g., BA, AB, BS)  Occupational History   Not on file  Tobacco Use   Smoking status: Never   Smokeless tobacco: Never  Vaping Use   Vaping status: Never Used  Substance and Sexual Activity   Alcohol use: No   Drug use: No   Sexual activity: Yes  Other Topics Concern   Not on file  Social History Narrative   Lives with fiance   R handed   Caffeine : 1 drink a day   Social Drivers of Corporate Investment Banker Strain: Not on file  Food Insecurity: No Food Insecurity (01/09/2024)   Hunger Vital Sign    Worried About Running Out of Food in the Last Year: Never true    Ran Out of Food in the Last Year: Never true  Transportation Needs: No Transportation Needs (01/09/2024)   PRAPARE - Administrator, Civil Service (Medical): No    Lack of Transportation (Non-Medical): No  Physical Activity: Not on file  Stress: Not on file  Social Connections: Not on file  Intimate Partner Violence: Not At Risk (01/09/2024)   Humiliation, Afraid, Rape, and Kick questionnaire    Fear of Current or Ex-Partner: No    Emotionally Abused: No    Physically Abused: No    Sexually Abused: No     RELEVANT GI HISTORY, IMAGING AND LABS: CBC    Component Value Date/Time    WBC 6.1 08/09/2024 0934   WBC 9.3 02/24/2024 1515   RBC 5.06 08/09/2024 0934   RBC 4.89 02/24/2024 1515   HGB 14.8 08/09/2024 0934   HCT 45.8 08/09/2024 0934   PLT 300 08/09/2024 0934   MCV 91 08/09/2024 0934   MCH 29.2 08/09/2024 0934   MCH 29.0 02/24/2024 1515   MCHC 32.3 08/09/2024 0934   MCHC 33.6 02/24/2024 1515   RDW 12.8 08/09/2024 0934   LYMPHSABS 1.7 08/09/2024 0934   MONOABS 0.8 02/24/2024 1515   EOSABS 0.0 08/09/2024 0934   BASOSABS 0.0 08/09/2024 0934   Recent Labs    01/09/24 0739 01/10/24 0513 01/12/24 1743 01/13/24 0655 01/16/24 0527 01/23/24 0525 02/07/24 0950 02/24/24 1515 02/24/24 1521 08/09/24 0934  HGB 14.0 14.7 16.1* 15.5* 13.7 13.9 15.0 14.2 14.3 14.8    CMP     Component Value Date/Time   NA 139 08/09/2024 0934   K 4.0 08/09/2024 0934   CL 103 08/09/2024 0934   CO2 20 08/09/2024 0934   GLUCOSE 78 08/09/2024 0934   GLUCOSE 122 (H) 02/24/2024 1521   BUN 10 08/09/2024 0934   CREATININE 0.87 08/09/2024 0934   CALCIUM  9.5 08/09/2024 0934   CALCIUM  8.9 01/07/2024 2231   PROT 7.6 08/09/2024 0934   ALBUMIN 5.0 08/09/2024 0934   AST 14 08/09/2024 0934   ALT 9 08/09/2024 0934   ALKPHOS 118 (H) 08/09/2024 0934   BILITOT 0.5 08/09/2024 0934   GFRNONAA >60 02/24/2024 1515   GFRAA >60 12/01/2017 1018      Latest Ref Rng & Units 08/09/2024    9:34 AM 02/24/2024    3:15 PM 02/07/2024    9:50 AM  Hepatic Function  Total Protein 6.0 - 8.5 g/dL 7.6  6.8  7.2   Albumin 4.0 - 5.0 g/dL 5.0  3.9  4.7   AST 0 - 40 IU/L 14  25  19    ALT 0 - 32 IU/L 9  27  28    Alk Phosphatase 41 - 116 IU/L 118  85  106   Total Bilirubin 0.0 - 1.2 mg/dL 0.5  0.9  0.8       Review of Systems   All systems reviewed and negative except where noted in HPI.    Physical Exam  BP 101/68   Pulse 89   Temp 98 F (36.7 C)   Ht 5' 7 (1.702 m)   Wt 139 lb (63 kg)   LMP 09/04/2024 (Exact Date)   SpO2 98%   BMI 21.77 kg/m  Patient's last menstrual period was  09/04/2024 (exact date). General:   Alert and oriented. Pleasant and cooperative. Well-nourished and well-developed.  In no acute distress Head:  Normocephalic and atraumatic. Eyes:  Without icterus Ears:  Normal auditory acuity. Neck:  Supple; no masses or thyromegaly. Lungs:  Respirations even and unlabored.  Clear throughout to auscultation.   No wheezes, crackles, or rhonchi. No acute distress. Heart:  Regular rate and rhythm; no murmurs, clicks, rubs, or gallops. Abdomen:  Normal bowel sounds.  No bruits.  Soft, non-tender and non-distended without masses, hepatosplenomegaly or hernias noted.  No guarding or rebound tenderness.   Rectal:  Deferred. [name]:  Symmetrical without gross deformities. Normal posture. Extremities:  Without edema. Neurologic:  Alert and  oriented x4;  grossly normal neurologically. Skin:  Intact without significant lesions or rashes. Psych:  Alert and cooperative. Normal mood and affect.   Assessment & Plan   Faith Jordan is a 28 y.o. female presenting today with chronic nausea and constipation.   Chronic nausea. Started about a month after stroke. Has lost weight due to not being hungry and constant nausea.  - Will try PPI, sent in pantoprazole  40 mg once a day 30 minutes prior to meal.   - Due to history of stroke, will evaluate for gastroparesis, will order gastric emptying study. - consider upper GI series.  Constipation. - Drink 64 ounces of Fluids Daily. - Start Miralax  Mix 1 capful in a drink daily. - Start Benefiber Mix 1 TBSP in a drink daily. - If no improvement, consider Linzess, Amitiza or Trulance.  Follow-up in 6 weeks.  Grayce Bohr, DNP, AGNP-C [facility]

## 2024-09-07 ENCOUNTER — Encounter: Payer: Self-pay | Admitting: Family Medicine

## 2024-09-07 ENCOUNTER — Other Ambulatory Visit: Payer: Self-pay

## 2024-09-07 ENCOUNTER — Ambulatory Visit: Admitting: Family Medicine

## 2024-09-07 VITALS — BP 101/68 | HR 89 | Temp 98.0°F | Ht 67.0 in | Wt 139.0 lb

## 2024-09-07 DIAGNOSIS — R110 Nausea: Secondary | ICD-10-CM | POA: Diagnosis not present

## 2024-09-07 DIAGNOSIS — R634 Abnormal weight loss: Secondary | ICD-10-CM | POA: Diagnosis not present

## 2024-09-07 DIAGNOSIS — K5909 Other constipation: Secondary | ICD-10-CM

## 2024-09-07 MED ORDER — PANTOPRAZOLE SODIUM 40 MG PO TBEC
40.0000 mg | DELAYED_RELEASE_TABLET | Freq: Every day | ORAL | 3 refills | Status: AC
  Filled 2024-09-07: qty 90, 90d supply, fill #0

## 2024-09-07 NOTE — Patient Instructions (Addendum)
 Gastric Emptying study scheduled @ [facility] . Please arrive @ 09/14/24 @ 10:45. Nothing to eat/drink after midnight. Please HOLD Protonix  for 8 hours prior to test.   Start Miralax  Mix 1 capful in a drink daily. Start Benefiber Mix 1 TBSP in a drink daily.

## 2024-09-14 ENCOUNTER — Encounter
Admission: RE | Admit: 2024-09-14 | Discharge: 2024-09-14 | Disposition: A | Source: Ambulatory Visit | Attending: Family Medicine | Admitting: Family Medicine

## 2024-09-14 DIAGNOSIS — R110 Nausea: Secondary | ICD-10-CM | POA: Insufficient documentation

## 2024-09-14 MED ORDER — TECHNETIUM TC 99M SULFUR COLLOID
1.8000 | Freq: Once | INTRAVENOUS | Status: AC | PRN
  Administered 2024-09-14: 1.8 via ORAL

## 2024-09-15 ENCOUNTER — Encounter (HOSPITAL_BASED_OUTPATIENT_CLINIC_OR_DEPARTMENT_OTHER): Payer: Self-pay | Admitting: Emergency Medicine

## 2024-09-15 ENCOUNTER — Other Ambulatory Visit: Payer: Self-pay

## 2024-09-15 ENCOUNTER — Emergency Department (HOSPITAL_BASED_OUTPATIENT_CLINIC_OR_DEPARTMENT_OTHER)
Admission: EM | Admit: 2024-09-15 | Discharge: 2024-09-15 | Disposition: A | Discharge status: Home / Self Care | Attending: Emergency Medicine | Admitting: Emergency Medicine

## 2024-09-15 ENCOUNTER — Emergency Department (HOSPITAL_BASED_OUTPATIENT_CLINIC_OR_DEPARTMENT_OTHER)

## 2024-09-15 DIAGNOSIS — W108XXA Fall (on) (from) other stairs and steps, initial encounter: Secondary | ICD-10-CM | POA: Diagnosis not present

## 2024-09-15 DIAGNOSIS — S90812A Abrasion, left foot, initial encounter: Secondary | ICD-10-CM | POA: Diagnosis not present

## 2024-09-15 DIAGNOSIS — M79672 Pain in left foot: Secondary | ICD-10-CM | POA: Diagnosis not present

## 2024-09-15 DIAGNOSIS — S99922A Unspecified injury of left foot, initial encounter: Secondary | ICD-10-CM | POA: Diagnosis not present

## 2024-09-15 DIAGNOSIS — S0990XA Unspecified injury of head, initial encounter: Secondary | ICD-10-CM | POA: Diagnosis not present

## 2024-09-15 DIAGNOSIS — S8992XA Unspecified injury of left lower leg, initial encounter: Secondary | ICD-10-CM | POA: Insufficient documentation

## 2024-09-15 DIAGNOSIS — M25562 Pain in left knee: Secondary | ICD-10-CM | POA: Diagnosis not present

## 2024-09-15 DIAGNOSIS — W19XXXA Unspecified fall, initial encounter: Secondary | ICD-10-CM

## 2024-09-15 NOTE — ED Triage Notes (Signed)
 Pt endorses mechanical fall this am. Pt c/o LLE pain from knee to foot. Pt denies head injury, endorses plavix . Painful to bear weight. Mobic  pta

## 2024-09-15 NOTE — ED Notes (Addendum)
 Pt endorses knee and foot pain. Reports leg was bent behind me. Pt endorses hx of CVA, reports that LT side was affected and has baseline weakness

## 2024-09-15 NOTE — Discharge Instructions (Signed)
 You were seen for your knee sprain in the emergency department.   At home, please take tylenol  and ibuprofen for your pain.  Please rest your joint and elevate it.  Use ice to limit the swelling.  Use a knee sleeve for comfort.  You may bear weight as tolerated.  Use the crutches we have given you if you are unable to bear weight due to pain.  Check your MyChart online for the results of any tests that had not resulted by the time you left the emergency department.   Follow-up with [facility] next week.  Return immediately to the emergency department if you experience any of the following: Worsening pain, or any other concerning symptoms.    Thank you for visiting our Emergency Department. It was a pleasure taking care of you today.

## 2024-09-15 NOTE — ED Notes (Signed)
 Patient provided Ice.  Patient refuses to perform pregnancy urine test - will sign waiver with xray if needed.

## 2024-09-15 NOTE — ED Provider Notes (Signed)
 [place] EMERGENCY DEPARTMENT AT [facility] Provider Note   CSN: [id number] Arrival date & time: 09/15/24  9166     Patient presents with: [place]   Faith Jordan is a 28 y.o. female.   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  This morning patient was going the stairs when she fell down approximately 6 stairs.  Says that her left leg folded under to her.  Since then has been having significant pain when trying to extend her left knee.  Unable to bear weight.  Also has some pain near the great toe of her left foot.  Took some Mobic  prior to arrival.  No head strike or LOC.       Prior to Admission medications   Medication Sig Start Date End Date Taking? Authorizing Provider  acetaminophen  (TYLENOL ) 325 MG tablet Take 2 tablets (650 mg total) by mouth every 6 (six) hours as needed for mild pain (pain score 1-3), fever or headache. 01/12/24   Gonfa, Taye T, MD  atorvastatin  (LIPITOR ) 10 MG tablet Take 0.5 tablets (5 mg total) by mouth at bedtime. 08/21/24     baclofen  (LIORESAL ) 10 MG tablet Take 1 tablet (10 mg total) by mouth 3 (three) times daily. Patient taking differently: Take 10 mg by mouth in the morning and at bedtime. Takes 5 mg in am and afternoon and 10 mg at bedtime 02/07/24   Ines Onetha KATHEE, MD  botulinum toxin Type A  (BOTOX ) 200 units injection Provider to inject 155 units into the muscles of the head and neck every 12 weeks. Discard remainder. 02/20/24   Ines Onetha KATHEE, MD  busPIRone  (BUSPAR ) 10 MG tablet Take 0.5 tablets (5 mg total) by mouth 3 (three) times daily for 7 days, THEN 1 tablet (10 mg total) 3 (three) times daily 09/05/24 10/06/24  Mozingo, Regina Nattalie, NP  Cholecalciferol (VITAMIN D3) 50 MCG (2000 UT) capsule Take 2,000 Units by mouth in the morning.    [provider]  clopidogrel  (PLAVIX ) 75 MG tablet Take 1 tablet (75 mg  total) by mouth daily. 05/25/24   Clark, Katherine K, NP  Coenzyme Q10 (COQ-10 PO) Take 1 tablet by mouth in the morning.    [provider]  FLUoxetine  (PROZAC ) 40 MG capsule Take 1 capsule (40 mg total) by mouth at bedtime. 09/05/24   Mozingo, Regina Nattalie, NP  Galcanezumab -gnlm (EMGALITY ) 120 MG/ML SOAJ Inject 1 Dose into the skin every 30 (thirty) days.    [provider]  LORazepam  (ATIVAN ) 1 MG tablet Take 1 tablet (1 mg total) by mouth 2 (two) times daily. 05/23/24   Mozingo, Regina Nattalie, NP  ondansetron  (ZOFRAN -ODT) 4 MG disintegrating tablet Take 1 tablet (4 mg total) by mouth every 8 (eight) hours as needed for nausea or vomiting. 06/04/24   Clark, Katherine K, NP  pantoprazole  (PROTONIX ) 40 MG tablet Take 1 tablet (40 mg total) by mouth daily. 09/07/24   Celestia Rima, NP  promethazine  (PHENERGAN ) 12.5 MG tablet Take 1 tablet (12.5 mg total) by mouth every 6 (six) hours as needed for nausea or vomiting. 08/09/24   Lomax, Amy, NP  Rimegepant Sulfate  (NURTEC) 75 MG TBDP Take 1 tablet by mouth as needed.    [provider]  topiramate  (TOPAMAX ) 50 MG tablet Take 1 tablet (50 mg total) by mouth daily. May also take 1 tablet (50 mg total) daily as needed (daily during menstrual cycle).  01/27/24   Angiulli, Toribio PARAS, PA-C  traZODone  (DESYREL ) 100 MG tablet Take 1 tablet (100 mg total) by mouth at bedtime. 08/07/24   Mozingo, Regina Nattalie, NP  Ubrogepant  (UBRELVY ) 100 MG TABS Take 1 tablet onset migraine may repeat in 2 hours if needed. (Max 2 tabs/ 24 hrs) 06/25/24   Ines Onetha NOVAK, MD  vitamin B-12 (CYANOCOBALAMIN ) 500 MCG tablet Take 500 mcg by mouth daily.    [provider]    Allergies: Advil [ibuprofen], Nsaids, and Sulfa antibiotics    Review of Systems  Updated Vital Signs BP 128/72 (BP Location: Right Arm)   Pulse 85   Temp 97.7 F (36.5 C) (Oral)   Resp 18   Ht 5' 7 (1.702 m)   Wt 61.2 kg   LMP 09/04/2024 (Within Days)   SpO2 100%    BMI 21.13 kg/m   Physical Exam Constitutional:      Appearance: Normal appearance.  HENT:     Head: Normocephalic and atraumatic.  Musculoskeletal:     Comments: DP pulses 2+ bilaterally.  Small abrasion and tenderness palpation of left first MTP.  No deformity of the foot.  No deformity of the knee or effusion noted.  Mild tenderness to palpation of the lateral aspect of the left knee.  Negative Lachman and anterior drawer test of the left knee.  No laxity with varus or valgus stress  Neurological:     Mental Status: She is alert.     (all labs ordered are listed, but only abnormal results are displayed) Labs Reviewed - No data to display   EKG: None  Radiology: DG Foot Complete Left Result Date: 09/15/2024 EXAM: 3 or more VIEW(S) XRAY OF THE left FOOT 09/15/2024 09:29:55 AM COMPARISON: None available. CLINICAL HISTORY: Foot  pain sp fall FINDINGS: BONES AND JOINTS: No acute fracture. No focal osseous lesion. No joint dislocation. SOFT TISSUES: The soft tissues are unremarkable. IMPRESSION: 1. No acute fracture or dislocation. Electronically signed by: Norleen Kil MD 09/15/2024 10:25 AM EST RP Workstation: [id number]   DG Knee Complete 4 Views Left Result Date: 09/15/2024 EXAM: 4 or more VIEW(S) XRAY OF THE left knee 09/15/2024 09:29:55 AM COMPARISON: None available. CLINICAL HISTORY: knee pain sp fall FINDINGS: BONES AND JOINTS: No acute fracture. No focal osseous lesion. No joint dislocation. No significant joint effusion. No significant degenerative changes. SOFT TISSUES: The soft tissues are unremarkable. IMPRESSION: 1. Negative. Electronically signed by: Norleen Kil MD 09/15/2024 10:24 AM EST RP Workstation: [id number]     Procedures   Medications Ordered in the ED - No data to display  Clinical Course as of 09/15/24 1045  Sat Sep 15, 2024  0924 Patient declined urine pregnancy test [RP]    Clinical Course User Index [RP] Yolande Lamar BROCKS, MD                                  Medical Decision Making Amount and/or Complexity of Data Reviewed Labs: ordered. Radiology: ordered.   Faith Jordan is a 28 year old female history of right basal ganglia infarct with left-sided weakness who presents emergency department after a fall  Initial Ddx:  TBI, concussion, C-spine injury, knee fracture, ligamentous strain, knee dislocation, foot fracture  MDM/Course:  Patient presents emergency department after mechanical fall down 6 stairs.  No head strike or LOC.  Not having any headache or neck pain.  Is complaining of pain of her left knee  and left foot.  On exam no obvious deformities.  No significant swelling.  Does have a small abrasion to the left foot.  She had x-rays that do not show fracture of her knee or foot.  Upon re-evaluation still having some mild pain.  No joint laxity that would suggest significant ligamentous injury.  She is able to fully flex her knee but is having difficulty fully extending it.  Could potentially have a meniscal injury or minor ligament injury and so we will have her bear weight as tolerated, use a knee sleeve, and follow-up with orthopedics to see if she needs an MRI if her symptoms do not improve in several days.  This patient presents to the ED for concern of complaints listed in HPI, this involves an extensive number of treatment options, and is a complaint that carries with it a high risk of complications and morbidity. Disposition including potential need for admission considered.   Dispo: DC Home. Return precautions discussed including, but not limited to, those listed in the AVS. Allowed pt time to ask questions which were answered fully prior to dc.  Additional history obtained from father Records reviewed Outpatient Clinic Notes I independently reviewed the following imaging with scope of interpretation limited to determining acute life threatening conditions related to emergency care: Extremity x-ray(s) and agree with the  radiologist interpretation with the following exceptions: none I have reviewed the patients home medications and made adjustments as needed  Portions of this note were generated with Dragon dictation software. Dictation errors may occur despite best attempts at proofreading.     Final diagnoses:  Injury of left knee, initial encounter  Injury of left foot, initial encounter  Fall, initial encounter    ED Discharge Orders     None          Yolande Lamar BROCKS, MD 09/15/24 1045

## 2024-09-16 DIAGNOSIS — S99822A Other specified injuries of left foot, initial encounter: Secondary | ICD-10-CM | POA: Diagnosis not present

## 2024-09-18 ENCOUNTER — Other Ambulatory Visit: Payer: Self-pay

## 2024-09-18 ENCOUNTER — Ambulatory Visit: Payer: Self-pay | Admitting: Family Medicine

## 2024-09-18 ENCOUNTER — Encounter: Payer: Self-pay | Admitting: Physician Assistant

## 2024-09-18 ENCOUNTER — Ambulatory Visit: Admitting: Physician Assistant

## 2024-09-18 DIAGNOSIS — M25562 Pain in left knee: Secondary | ICD-10-CM | POA: Diagnosis not present

## 2024-09-18 DIAGNOSIS — M25572 Pain in left ankle and joints of left foot: Secondary | ICD-10-CM | POA: Insufficient documentation

## 2024-09-18 NOTE — Progress Notes (Signed)
 Office Visit Note   Patient: Faith Jordan           Date of Birth: 1996/03/25           MRN: [id number] Visit Date: 09/18/2024              Requested by: Gretta Comer POUR, NP [address] [place],  [place] [address] PCP: Gretta Comer POUR, NP   Assessment & Plan: Visit Diagnoses:  1. Acute pain of left knee   2. 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  She has a recent history of a left-sided CVA.  She had a fall down some stairs over the weekend.  She was placed in an immobilizer placed on crutches she is doing better today.  Exam is fairly benign except she does have pain in the posterior aspect of her knee with stretching of her hamstrings.  No meniscal pathology she has good ligamentous stability equivalent with the nonaffected side.  She does not have any effusion.  She does have some pain over the first MTP joint but again x-rays are negative.  I would like for her to continue with range of motion I do not think she needs the immobilizer can use a walking stick or cane as needed.  She has worked with Ortho neuro[facility]hab since her stroke and I will refer her back there today for great gait training and strengthening.  Would like to reevaluate her in 3 weeks  Follow-Up Instructions: No follow-ups on file.   Orders:  Orders Placed This Encounter  Procedures   Ambulatory referral to Physical Therapy   No orders of the defined types were placed in this encounter.     Procedures: No procedures performed   Clinical Data: No additional findings.   Subjective: Left knee pain status post fall  HPI Patient is a 28 year old woman with a history of CVA who presents today with left knee pain and left foot pain.  She had a fall 3 days ago was seen in the emergency room 2 days ago.  She is complaining of having trouble straightening out her leg and putting weight on it.  She is taking Mobic   for pain Review of Systems  All other systems reviewed and are negative.    Objective: Vital Signs: LMP 09/04/2024 (Within Days)   Physical Exam Constitutional:      Appearance: Normal appearance.  Skin:    General: Skin is warm and dry.  Neurological:     General: No focal deficit present.     Mental Status: She is alert and oriented to person, place, and time.  Psychiatric:        Mood and Affect: Mood normal.        Behavior: Behavior normal.     Ortho Exam Examination of her left knee she has no effusion no erythema compartments are soft and compressible she is neurovascularly intact distally she has good extension and flexion strength.  No deficits with extension and flexion of her ankle.  Pulses are intact no deformity she has good varus valgus stability without pain good endpoint on anterior draw very compatible with her right side.  She does have tenderness pain in the hamstrings with terminal extension no pain with flexion negative McMurray's sign.  Her foot she has no instability through the Lisfranc complex little pain at the first MTP joint Specialty  Comments:  No specialty comments available.  Imaging: No results found.   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) 04/09/2024   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 (CVA) 04/05/2024   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) 02/10/2024   Coping style affecting medical condition 01/19/2024   History of CVA (cerebrovascular accident) 01/12/2024   Stroke of right basal ganglia (HCC) 01/07/2024   Non-restorative sleep 05/26/2022   Sleep paralysis, 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   Vivid dream 05/26/2022   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   Excessive daytime sleepiness 05/26/2022   Sleep disturbance 03/17/2022   Panic attacks 02/11/2022   Vitamin B12 deficiency 07/30/2021   Vitamin D  deficiency 07/30/2021   Family history of hypothyroidism 07/30/2021   GAD (generalized anxiety disorder) 03/18/2020   Fatigue 11/06/2019   Chronic back pain 07/06/2019   Preventative health care 04/27/2018   Migraines 04/25/2018   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   Anemia 01/04/2018   Headache disorder 12/29/2015   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   Sinus tachycardia 12/22/2015   Dysautonomia (HCC) 12/22/2015   Dysautonomia, familial (HCC) 04/22/2015   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   Neurocardiogenic syncope 04/06/2013   Intermittent palpitations 04/06/2013   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   Acute flank pain 06/02/2022   Angio-edema 04/27/2016   Angioedema    Aquagenic angio-edema-urticaria    Asthma    no problems /not used recently   Dysautonomia (HCC)    Dysrhythmia    sinus tachycardia   Fibroid    right breast, adenoma   Headache(784.0)    History of COVID-19 11/21/2020   Hives 12/22/2015   Pneumonia    hx  6th grade   Sensation of fullness in both ears 02/02/2023   Snoring 02/11/2022   Urinary frequency 02/12/2019   Vaginal yeast infection 06/02/2022   Vasculitis     Family History  Problem Relation Age of Onset   Depression Mother    Migraines Mother    Narcolepsy Mother    Hypothyroidism Mother    Breast cancer Maternal Grandmother 58   Myasthenia gravis Maternal Grandfather    Asthma Other    Cancer Other    Epilepsy Other    Allergic rhinitis Neg Hx    Angioedema Neg Hx    Atopy Neg Hx    Eczema Neg Hx    Immunodeficiency Neg Hx    Urticaria Neg Hx    Stroke Neg Hx     Past Surgical History:  Procedure Laterality Date   ADENOIDECTOMY     BREAST LUMPECTOMY Right    BREAST LUMPECTOMY Right     MASS EXCISION Right 10/07/2014   Procedure: EXCISION OF RIGHT BREAST MASS;  Surgeon: Krystal Russell, MD;  Location: [facility] OR;  Service: General;  Laterality: Right;   TONSILLECTOMY AND ADENOIDECTOMY     TOOTH EXTRACTION     TRANSESOPHAGEAL ECHOCARDIOGRAM (CATH LAB) N/A 01/10/2024   Procedure: TRANSESOPHAGEAL ECHOCARDIOGRAM;  Surgeon: Jeffrie Oneil BROCKS, MD;  Location: [facility] INVASIVE CV LAB;  Service: Cardiovascular;  Laterality: N/A;   Social History   Occupational History   Not on file  Tobacco Use   Smoking status:  Never   Smokeless tobacco: Never  Vaping Use   Vaping status: Never Used  Substance and Sexual Activity   Alcohol use: No   Drug use: No   Sexual activity: Yes

## 2024-09-18 NOTE — Progress Notes (Signed)
 Specialty Pharmacy Refill Coordination Note  Faith Jordan is a 28 y.o. female assessed today regarding refills of clinic administered specialty medication(s) OnabotulinumtoxinA  (BOTOX )   Clinic requested Courier to Provider Office   Delivery date: 09/20/24   Verified address: [facility] [address] [place], [place] [address]   Medication will be filled on: 09/19/24   Copay: $0.00 Appointment: 12.10.25

## 2024-09-19 ENCOUNTER — Other Ambulatory Visit: Payer: Self-pay

## 2024-09-24 ENCOUNTER — Other Ambulatory Visit: Payer: Self-pay

## 2024-09-26 ENCOUNTER — Ambulatory Visit: Admitting: Family Medicine

## 2024-09-26 ENCOUNTER — Encounter: Payer: Self-pay | Admitting: Family Medicine

## 2024-09-26 DIAGNOSIS — G43109 Migraine with aura, not intractable, without status migrainosus: Secondary | ICD-10-CM

## 2024-09-26 DIAGNOSIS — G43E09 Chronic migraine with aura, not intractable, without status migrainosus: Secondary | ICD-10-CM

## 2024-09-26 MED ORDER — ONABOTULINUMTOXINA 200 UNITS IJ SOLR
155.0000 [IU] | Freq: Once | INTRAMUSCULAR | Status: AC
  Administered 2024-09-26: 155 [IU] via INTRAMUSCULAR

## 2024-09-26 NOTE — Progress Notes (Signed)
 09/26/24 ALL: Faith Jordan returns for Botox . She is doing well from a migraine standpoint. She has not had any significant headaches since last Botox  visit. She does report nausea has decreased. She is eating better. She had a fall 09/13/2024. She is having significant left leg pain. She is starting PT soon.   06/25/2024 ALL: Faith Jordan returns for Botox . She is doing fairly well. May be having more headaches over the past 12 weeks. She has returned to work part time. She seems to be doing well. No hemiplegic or stroke like symptoms. She continues close follow up with PCP and Dr Carilyn. She was seen by Dr Lanny 05/2024 who is repeating TCD and pulmonary angiography to evaluate extracardiac shunt. Lipitor  decreased to 5mg  as LDL was 50 .  03/21/2024 ALL: Faith Jordan presents for Botox . She continues Emgality  montly and tolerating it well. She is having regular headache days but reports migraines have remained well controlled. She has had one event since stroke 12/2023 where she had left hemiplegic symptoms with slurred speech. No new intracranial abnormalities. Migraine cocktail relieved symptoms. She is scheduled to see Dr Lanny with [facility] at the end of June.     Consent Form Botulism Toxin Injection For Chronic Migraine    Reviewed orally with patient, additionally signature is on file:  Botulism toxin has been approved by the Federal drug administration for treatment of chronic migraine. Botulism toxin does not cure chronic migraine and it may not be effective in some patients.  The administration of botulism toxin is accomplished by injecting a small amount of toxin into the muscles of the neck and head. Dosage must be titrated for each individual. Any benefits resulting from botulism toxin tend to wear off after 3 months with a repeat injection required if benefit is to be maintained. Injections are usually done every 3-4 months with maximum effect peak achieved by about 2 or 3 weeks. Botulism  toxin is expensive and you should be sure of what costs you will incur resulting from the injection.  The side effects of botulism toxin use for chronic migraine may include:   -Transient, and usually mild, facial weakness with facial injections  -Transient, and usually mild, head or neck weakness with head/neck injections  -Reduction or loss of forehead facial animation due to forehead muscle weakness  -Eyelid drooping  -Dry eye  -Pain at the site of injection or bruising at the site of injection  -Double vision  -Potential unknown long term risks   Contraindications: You should not have Botox  if you are pregnant, nursing, allergic to albumin, have an infection, skin condition, or muscle weakness at the site of the injection, or have myasthenia gravis, Lambert-Eaton syndrome, or ALS.  It is also possible that as with any injection, there may be an allergic reaction or no effect from the medication. Reduced effectiveness after repeated injections is sometimes seen and rarely infection at the injection site may occur. All care will be taken to prevent these side effects. If therapy is given over a long time, atrophy and wasting in the muscle injected may occur. Occasionally the patient's become refractory to treatment because they develop antibodies to the toxin. In this event, therapy needs to be modified.  I have read the above information and consent to the administration of botulism toxin.    BOTOX  PROCEDURE NOTE FOR MIGRAINE HEADACHE  Contraindications and precautions discussed with patient(above). Aseptic procedure was observed and patient tolerated procedure. Procedure performed by Greig Forbes, FNP-C.  The condition has existed for more than 6 months, and pt does not have a diagnosis of ALS, Myasthenia Gravis or Lambert-Eaton Syndrome.  Risks and benefits of injections discussed and pt agrees to proceed with the procedure.  Written consent obtained  These injections are medically  necessary. Pt  receives good benefits from these injections. These injections do not cause sedations or hallucinations which the oral therapies may cause.   Description of procedure:  The patient was placed in a sitting position. The standard protocol was used for Botox  as follows, with 5 units of Botox  injected at each site:  -Procerus muscle, midline injection  -Corrugator muscle, bilateral injection  -Frontalis muscle, bilateral injection, with 2 sites each side, medial injection was performed in the upper one third of the frontalis muscle, in the region vertical from the medial inferior edge of the superior orbital rim. The lateral injection was again in the upper one third of the forehead vertically above the lateral limbus of the cornea, 1.5 cm lateral to the medial injection site.  -Temporalis muscle injection, 4 sites, bilaterally. The first injection was 3 cm above the tragus of the ear, second injection site was 1.5 cm to 3 cm up from the first injection site in line with the tragus of the ear. The third injection site was 1.5-3 cm forward between the first 2 injection sites. The fourth injection site was 1.5 cm posterior to the second injection site. 5th site laterally in the temporalis  muscleat the level of the outer canthus.  -Occipitalis muscle injection, 3 sites, bilaterally. The first injection was done one half way between the occipital protuberance and the tip of the mastoid process behind the ear. The second injection site was done lateral and superior to the first, 1 fingerbreadth from the first injection. The third injection site was 1 fingerbreadth superiorly and medially from the first injection site.  -Cervical paraspinal muscle injection, 2 sites, bilaterally. The first injection site was 1 cm from the midline of the cervical spine, 3 cm inferior to the lower border of the occipital protuberance. The second injection site was 1.5 cm superiorly and laterally to the first  injection site.  -Trapezius muscle injection was performed at 3 sites, bilaterally. The first injection site was in the upper trapezius muscle halfway between the inflection point of the neck, and the acromion. The second injection site was one half way between the acromion and the first injection site. The third injection was done between the first injection site and the inflection point of the neck.  Omitted masseter injections 09/26/2024   Will return for repeat injection in 3 months.   A total of 200 units of Botox  was prepared, 155 units of Botox  was injected as documented above, any Botox  not injected was wasted. The patient tolerated the procedure well, there were no complications of the above procedure.

## 2024-09-26 NOTE — Progress Notes (Signed)
 Botox - 200 units x 1 vial Lot: [id number] Expiration: 2027/06 NDC: 0023-3921-02  Bacteriostatic 0.9% Sodium Chloride - 4 mL  Lot: [id number] Expiration: 08/17/25 NDC: 9590803397  Dx: H56.290  S/P  Witnessed by [facility] RMA

## 2024-09-27 ENCOUNTER — Other Ambulatory Visit: Payer: Self-pay

## 2024-09-27 ENCOUNTER — Ambulatory Visit: Admitting: Psychology

## 2024-09-27 DIAGNOSIS — F411 Generalized anxiety disorder: Secondary | ICD-10-CM | POA: Diagnosis not present

## 2024-09-27 DIAGNOSIS — F330 Major depressive disorder, recurrent, mild: Secondary | ICD-10-CM

## 2024-09-27 DIAGNOSIS — F429 Obsessive-compulsive disorder, unspecified: Secondary | ICD-10-CM | POA: Diagnosis not present

## 2024-09-27 NOTE — Progress Notes (Signed)
 [facility] Counselor/Therapist Progress Note  Patient ID: Faith Jordan, MRN: [id number],    Date:  09/27/2024  Time Spent: 54 minutes  Time in:2:07 Time out: 3:01  Treatment Type:- The patient was seen via video visit.  She gave verbal consent for the session to be on video on caregility.  The patient was in her home alone and therapist was in the office.  Reported Symptoms: anxiety, panic attacks, sadness  Mental Status Exam: Appearance:  Casual     Behavior: Appropriate  Motor: Normal  Speech/Language:  Normal Rate  Affect: blunted  Mood: Sad and stressed  Thought process: normal  Thought content:   WNL  Sensory/Perceptual disturbances:   WNL  Orientation: oriented to person, place, time/date, and situation  Attention: Good  Concentration: Good  Memory: WNL  Fund of knowledge:  Good  Insight:   Fair  Judgment:  Good  Impulse Control: Good   Risk Assessment: Danger to Self:  No Self-injurious Behavior: No Danger to Others: No Duty to Warn:no Physical Aggression / Violence:No  Access to Firearms a concern: No  Gang Involvement:No   Subjective: The patient was seen for an individual therapy session  via video visit today.  The patient gave verbal permission for the session to be on video on caregility and she is aware of the limitations of telehealth.  The patient was in her home alone and therapist was in the office.  The patient presents with a blunted affect and her mood is sad and stressed.  The patient reports that there have been a lot of things that have happened over the last few weeks.  She states that she fell down the steps in her leg real bad and now has to go back to physical therapy because her gait is off again.  She also reports that she had some incidents with her parents that she feels bad about and she went into talking about how everybody hates her and we had to do some reframing around this.  It seems that her parents are very stressed  because they are dealing with their borderline daughter who apparently broke up with her boyfriend and they were feeling very stressed about that.  In addition they did not respond very well to the patient because she was needing them to help her because she has had a stroke and she fell down and they were feeling overwhelmed by this as well.  We talked about the patient being probably the most healthy of the family and she is the only one who is doing anything about what she needs to do something about.  Her family members refused to get therapy and probably are not aware of their own emotions or how to manage them.  We talked about how to have a conversation with her parents and let them know that she understands that they are very stressed right now but that they did not treat her exactly the way she needed to be treated in that moment. Interventions: Cognitive Behavioral Therapy and Assertiveness/Communication,, problem solving, psychoeducation,  EMDR as indicated, Meditation and mindfulness,   Diagnosis:GAD (generalized anxiety disorder)  Obsessive-compulsive disorder, unspecified type  Major depressive disorder, recurrent episode, mild  Plan: Client Abilities/Strengths  Intelligent, insightful, motivated  Client Treatment Preferences  Outpatient Individual therapy every other week  Client Statement of Needs   I feel better now, but still need some help with anxiety  Treatment Level  Outpatient Individual therapy  Symptoms  Frustration and anxiety  related to providing oversight and caretaking to an aging, ailing, and dependent  parent.: No Description Entered (Status: improved). Hypervigilance (e.g., feeling constantly on edge,  experiencing concentration difficulties, having trouble falling or staying asleep, exhibiting a general  state of irritability).: No Description Entered (Status: improved). Motor tension (e.g., restlessness,  tiredness, shakiness, muscle tension).: No Description  Entered (Status: improved).  Problems Addressed  Anxiety, Phase Of Life Problems, Anxiety  Goals 1. Learn and implement coping skills that result in a reduction of anxiety  and worry, and improved daily functioning. Objective Learn and implement calming skills to reduce overall anxiety and manage anxiety symptoms. Target Date: 2026-08-09Frequency: Weekly Progress: 50 Modality: individual  Related Interventions 1. Teach the client calming/relaxation skills (e.g., applied relaxation, progressive muscle  relaxation, cue controlled relaxation; mindful breathing; biofeedback) and how to discriminate  better between relaxation and tension; teach the client how to apply these skills to his/her daily  life (e.g., New Directions in Progressive Muscle Relaxation by Thornell Collier, and  Hazlett-Stevens; Treating Generalized Anxiety Disorder by Rygh and Red). Objective Identify, challenge, and replace biased, fearful self-talk with positive, realistic, and empowering selftalk. Target Date: 2025-05-26 Frequency: weekly Progress: 50 Modality: individual Related Interventions 1. Explore the client's schema and self-talk that mediate his/her fear response; assist him/her in  challenging the biases; replace the distorted messages with reality-based alternatives and  positive, realistic self-talk that will increase his/her self-confidence in coping with irrational  fears (see Cognitive Therapy of Anxiety Disorders by Gretta armin Mon). Objective Learn and implement problem-solving strategies for realistically addressing worries. Target Date: 2026-08-09Frequency: weekly Progress: 60 Modality: individual 2. Resolve conflicted feelings and adapt to the new life circumstances. Objective Apply problem-solving skills to current circumstances. Target Date: 2025-05-26 Frequency: weekly Progress: 30 Modality: individual Related Interventions 1. Teach the client problem-resolution skills (e.g.,  defining the problem clearly, brainstorming  multiple solutions, listing the pros and cons of each solution, seeking input from others,  selecting and implementing a plan of action, evaluating outcome, and readjusting plan as  necessary).   3. Stabilize anxiety level while increasing ability to function on a daily  basis. Diagnosis F33.1  Major depressive disorder, moderate 300.02 (Generalized anxiety disorder) - Open - [Signifier: n/a]  Axis  none 309.28 (Adjustment disorder with mixed anxiety and depressed  mood) - Open - [Signifier: n/a]  Adjustment Disorder,  With Anxiety   Marital conflict  Major Depressive disorder, moderate  Conditions For Discharge Achievement of treatment goals and objectives.  The patient approved this plan.   Chayce Rullo G Dynasia Kercheval, LCSW

## 2024-09-28 ENCOUNTER — Ambulatory Visit: Attending: Physician Assistant | Admitting: Physical Therapy

## 2024-09-28 DIAGNOSIS — M6281 Muscle weakness (generalized): Secondary | ICD-10-CM | POA: Diagnosis present

## 2024-09-28 DIAGNOSIS — R2689 Other abnormalities of gait and mobility: Secondary | ICD-10-CM | POA: Insufficient documentation

## 2024-09-28 DIAGNOSIS — R2681 Unsteadiness on feet: Secondary | ICD-10-CM

## 2024-09-28 DIAGNOSIS — R29898 Other symptoms and signs involving the musculoskeletal system: Secondary | ICD-10-CM | POA: Insufficient documentation

## 2024-09-28 DIAGNOSIS — M25562 Pain in left knee: Secondary | ICD-10-CM

## 2024-09-28 DIAGNOSIS — R29818 Other symptoms and signs involving the nervous system: Secondary | ICD-10-CM

## 2024-09-28 DIAGNOSIS — R278 Other lack of coordination: Secondary | ICD-10-CM

## 2024-09-28 NOTE — Therapy (Signed)
 OUTPATIENT PHYSICAL THERAPY NEURO EVALUATION   Patient Name: Faith Jordan MRN: [id number] DOB:Feb 22, 1996, 28 y.o., female Today's Date: 09/28/2024   PCP: Gretta Comer POUR, NP REFERRING PROVIDER: Persons, Ronal Dragon, [place]  END OF SESSION:  PT End of Session - 09/28/24 0804     Visit Number 1    Number of Visits 7   with eval   Date for Recertification  11/23/24    Authorization Type [employer] [place]    PT Start Time [phone]    PT Stop Time [phone]   eval   PT Time Calculation (min) 34 min    Equipment Utilized During Treatment Gait belt    Activity Tolerance Patient limited by pain    Behavior During Therapy [facility] for tasks assessed/performed          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   Acute flank pain 06/02/2022   Angio-edema 04/27/2016   Angioedema    Aquagenic angio-edema-urticaria    Asthma    no problems /not used recently   Dysautonomia (HCC)    Dysrhythmia    sinus tachycardia   Fibroid    right breast, adenoma   Headache(784.0)    History of COVID-19 11/21/2020   Hives 12/22/2015   Pneumonia    hx  6th grade   Sensation of fullness in both ears 02/02/2023   Snoring 02/11/2022   Urinary frequency 02/12/2019   Vaginal yeast infection 06/02/2022   Vasculitis    Past Surgical History:  Procedure Laterality Date   ADENOIDECTOMY     BREAST LUMPECTOMY Right    BREAST LUMPECTOMY Right    MASS EXCISION Right 10/07/2014   Procedure: EXCISION OF RIGHT BREAST MASS;  Surgeon: Krystal Russell, MD;  Location: [facility] OR;  Service: General;  Laterality: Right;   TONSILLECTOMY AND ADENOIDECTOMY     TOOTH EXTRACTION     TRANSESOPHAGEAL ECHOCARDIOGRAM (CATH LAB) N/A 01/10/2024   Procedure: TRANSESOPHAGEAL ECHOCARDIOGRAM;  Surgeon: Jeffrie Oneil BROCKS, MD;  Location: [facility] INVASIVE CV LAB;  Service: Cardiovascular;  Laterality: N/A;   Patient Active Problem List   Diagnosis Date Noted   Pain in left knee 09/18/2024    Pain in left ankle and joints of left foot 09/18/2024   Chronic nausea 06/04/2024   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) 04/09/2024   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 (CVA) 04/05/2024   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) 02/10/2024   Coping style affecting medical condition 01/19/2024   History of CVA (cerebrovascular accident) 01/12/2024   Stroke of right basal ganglia (HCC) 01/07/2024   Non-restorative sleep 05/26/2022   Sleep paralysis, 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   Vivid dream 05/26/2022   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   Excessive daytime sleepiness 05/26/2022   Sleep disturbance 03/17/2022   Panic attacks 02/11/2022   Vitamin B12 deficiency 07/30/2021   Vitamin D  deficiency 07/30/2021   Family history of hypothyroidism 07/30/2021   GAD (generalized anxiety disorder) 03/18/2020   Fatigue 11/06/2019   Chronic back pain 07/06/2019   Preventative health care 04/27/2018   Migraines 04/25/2018   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   Anemia 01/04/2018   Headache disorder 12/29/2015   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   Sinus tachycardia 12/22/2015   Dysautonomia (HCC) 12/22/2015   Dysautonomia, familial (HCC) 04/22/2015   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   Neurocardiogenic syncope 04/06/2013   Intermittent palpitations  04/06/2013    ONSET DATE: 09/18/2024  REFERRING DIAG: F74.437 (ICD-10-CM) - Acute pain of left knee  THERAPY DIAG:  Muscle weakness (generalized)  Unsteadiness on feet  Other lack of coordination  Other symptoms and signs involving the nervous system  Other symptoms and signs involving the musculoskeletal system  Other abnormalities of gait and mobility  Left knee pain, unspecified chronicity  Rationale for Evaluation and Treatment: Rehabilitation  SUBJECTIVE:                                                                                                                                                                                              SUBJECTIVE STATEMENT: [name]  Patient familiar to this clinic, last seen April-August 2025 following her CVA.  Pt had a fall a few weeks ago down the stairs, hurt her L knee and L foot. Pt does not think her LLE contributed to the fall. Xrays negative for any fractures. Pt reports that initially she could not put any weight on the L leg due to pain and was on crutches for a few days. She reports that she is still in a lot of pain and her L knee does not feel like it is stable. She has trouble bending her L knee and fully extending it, feels like she swings it out to the side when walking. She feels like her knee jerks when she tries to bend it and has noticed that her clonus is a lot worse.  Pt states, Before I walked weird but it didn't hurt, now it hurts. Pt also reports that her back hurts too from compensations. Pt reports that it is hard to get out of the car (low seat), hard to stand up if she has been sitting for an extended period of time, and that getting up from the floor is very difficult and painful. She is unable to squat. She has also noticed that cold weather makes her leg stiffer.  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 She also reports that she has been losing weight unintentionally, seeing her doctor about this. Pt is wanting to build back muscle and strength.   Pt accompanied by: self  PERTINENT HISTORY: PMH: familiar hemiplegic migraine, angioedema 2017,dysautonomia; infarct right internal capsule/ R basal ganglia stroke  PAIN:  Are you having pain? Yes: NPRS scale: 1/10 currently, 8/10 at worst Pain location: L knee (back of knee), tenderness along lateral joint line Pain description: sharp Aggravating factors: bending L knee all the way or straightening it all the way, standing/weight  bearing Relieving factors: propping it up  with pillow, sitting, resting  PRECAUTIONS: Fall  RED FLAGS: None   WEIGHT BEARING RESTRICTIONS: No  FALLS: Has patient fallen in last 6 months? Yes. [place], more recently down the stairs leading to this injury  LIVING ENVIRONMENT: Lives with: lives alone Lives in: House/apartment Stairs: Yes: Internal: 12 steps; on right going up, has to use arms quite a bit on her stairs - hard to just have weight on LLE Has following equipment at home: None  PLOF: Independent  PATIENT GOALS: to walk better gain strength  OBJECTIVE:  Note: Objective measures were completed at Evaluation unless otherwise noted.  DIAGNOSTIC FINDINGS:   L knee Xray 09/15/2024 CLINICAL HISTORY: knee pain sp fall   FINDINGS:   BONES AND JOINTS: No acute fracture. No focal osseous lesion. No joint dislocation. No significant joint effusion. No significant degenerative changes.   SOFT TISSUES: The soft tissues are unremarkable.   IMPRESSION: 1. Negative.  L foot xray 09/15/2024 CLINICAL HISTORY: Foot  pain sp fall   FINDINGS:   BONES AND JOINTS: No acute fracture. No focal osseous lesion. No joint dislocation.   SOFT TISSUES: The soft tissues are unremarkable.   IMPRESSION: 1. No acute fracture or dislocation.  COGNITION: Overall cognitive status: Within functional limits for tasks assessed   SENSATION: Appears to be [facility] No N/T  COORDINATION: Slightly impaired LLE  EDEMA:  Around L knee, down L shin, in L foot  TONE: clonus in LLE  POSTURE: rounded shoulders and forward head  PALPATION: No pain with PROM, pain with AROM into extension More pain with patellar mobility medially and superiorly vs laterally and inferiorly, hurts in back of knee Most tenderness along lateral joint line   LOWER EXTREMITY ROM:     Active  Right Eval Left Eval  Hip flexion WFL   Hip extension    Hip abduction     Hip adduction    Hip internal rotation    Hip external rotation    Knee flexion  WFL  Knee extension  0-40  Ankle dorsiflexion    Ankle plantarflexion    Ankle inversion    Ankle eversion     (Blank rows = not tested)  LOWER EXTREMITY MMT:    MMT Right Eval Left Eval  Hip flexion 5 5  Hip extension    Hip abduction    Hip adduction    Hip internal rotation    Hip external rotation    Knee flexion 5 4  Knee extension 5 4  Ankle dorsiflexion 5 5  Ankle plantarflexion    Ankle inversion    Ankle eversion    (Blank rows = not tested)  BED MOBILITY:  WFL per pt report  TRANSFERS: Sit to stand: Modified independence  Assistive device utilized: None     Stand to sit: Modified independence  Assistive device utilized: None     Chair to chair: Modified independence  Assistive device utilized: None       RAMP:  Not tested  CURB:  Not tested  STAIRS:  STAIRS:  Level of Assistance: Modified independence  Stair Negotiation Technique: Alternating Pattern  with Single Rail on Right  Number of Stairs: 6   Height of Stairs: 4  Comments: intermittent rail support   GAIT: Findings:  GAIT: Gait pattern: decreased stance time- Left, decreased hip/knee flexion- Left, circumduction- Left, and antalgic Distance walked: various clinic distances Assistive device utilized: None Level of assistance: Modified independence Comments: see  above  FUNCTIONAL TESTS:    [facility] PT Assessment - 09/28/24 0830       Ambulation/Gait   Gait velocity 32.8 ft over 9.75 sec = 3.36 ft/sec      Standardized Balance Assessment   Standardized Balance Assessment Timed Up and Go Test;Five Times Sit to Stand    Five times sit to stand comments  14 sec   no UE     Timed Up and Go Test   TUG Normal TUG    Normal TUG (seconds) 8.25   no AD     Functional Gait  Assessment   Gait assessed  Yes    Gait Level Surface Walks 20 ft in less than 7 sec but greater than 5.5 sec, uses assistive device,  slower speed, mild gait deviations, or deviates 6-10 in outside of the 12 in walkway width.    Change in Gait Speed Makes only minor adjustments to walking speed, or accomplishes a change in speed with significant gait deviations, deviates 10-15 in outside the 12 in walkway width, or changes speed but loses balance but is able to recover and continue walking.    Gait with Horizontal Head Turns Performs head turns with moderate changes in gait velocity, slows down, deviates 10-15 in outside 12 in walkway width but recovers, can continue to walk.    Gait with Vertical Head Turns Performs task with slight change in gait velocity (eg, minor disruption to smooth gait path), deviates 6 - 10 in outside 12 in walkway width or uses assistive device    Gait and Pivot Turn Pivot turns safely within 3 sec and stops quickly with no loss of balance.    Step Over Obstacle Is able to step over one shoe box (4.5 in total height) but must slow down and adjust steps to clear box safely. May require verbal cueing.    Gait with Narrow Base of Support Ambulates less than 4 steps heel to toe or cannot perform without assistance.    Gait with Eyes Closed Walks 20 ft, uses assistive device, slower speed, mild gait deviations, deviates 6-10 in outside 12 in walkway width. Ambulates 20 ft in less than 9 sec but greater than 7 sec.    Ambulating Backwards Walks 20 ft, uses assistive device, slower speed, mild gait deviations, deviates 6-10 in outside 12 in walkway width.    Steps Alternating feet, must use rail.    Total Score 16    FGA comment: 16/30, high fall risk            LOWER EXTREMITY SPECIAL TESTS:  Knee special tests: Anterior drawer test: positive , Posterior drawer test: negative, Apley's compression test: negative, McMurray's test: negative, and Patellafemoral apprehension test: negative                                                                                                                                TREATMENT DATE: PT Evaluation  TherAct Encouraged continued use of ice or heat depending on which provides better relief Encouraged continued ROM of L knee joint (avoid locking knee out into extension - but otherwise try to get full range)   PATIENT EDUCATION: Education details: Eval findings, PT POC, results of OM and functional implications, see above Person educated: Patient Education method: Medical Illustrator Education comprehension: verbalized understanding, returned demonstration, and needs further education  HOME EXERCISE PROGRAM: To be initiated  Verbally encouraged L knee full ROM ext/flex - without locking out knee into extension  GOALS: Goals reviewed with patient? Yes  SHORT TERM GOALS: Target date: 10/19/2024   Pt will be independent with initial HEP for improved strength, balance, transfers and gait as well as management of pain symptoms. Baseline: Goal status: INITIAL   LONG TERM GOALS: Target date: 11/09/2024   Pt will be independent with final HEP for improved strength, balance, transfers and gait as well as management of pain symptoms. Baseline:  Goal status: INITIAL  2.  Pt will improve FGA to 20/30 for decreased fall risk  Baseline: 16/30 (12/12) Goal status: INITIAL  3.  Pt will improve her L knee extension ROM to 0 degrees with increase in pain </= 2/10 from baseline Baseline: 40 degrees with 8/10 pain (12/12) Goal status: INITIAL    ASSESSMENT:  CLINICAL IMPRESSION: Patient is a 28 year old female referred to Neuro OPPT for L knee pain.   Pt's PMH is significant for: familiar hemiplegic migraine, angioedema 2017,dysautonomia; infarct right internal capsule/ R basal ganglia stroke. The following deficits were present during the exam: decreased L knee AROM, increased edema in LLE, decreased LLE strength, and impaired balance and increased pain impacting function. Based on her fall history and FGA score of 16/30, pt is an incr risk  for falls. Pt would benefit from skilled PT to address these impairments and functional limitations to maximize functional mobility independence.   OBJECTIVE IMPAIRMENTS: Abnormal gait, decreased activity tolerance, decreased balance, decreased coordination, difficulty walking, decreased ROM, decreased strength, increased edema, impaired perceived functional ability, increased muscle spasms, impaired tone, improper body mechanics, postural dysfunction, and pain.   ACTIVITY LIMITATIONS: bending, sitting, standing, squatting, stairs, and transfers  PARTICIPATION LIMITATIONS: community activity  PERSONAL FACTORS: 1-2 comorbidities:   familiar hemiplegic migraine, angioedema 2017,dysautonomia; infarct right internal capsule/ R basal ganglia strokeare also affecting patient's functional outcome.   REHAB POTENTIAL: Excellent  CLINICAL DECISION MAKING: Evolving/moderate complexity  EVALUATION COMPLEXITY: Moderate  PLAN:  PT FREQUENCY: 1x/week  PT DURATION: 6 weeks  PLANNED INTERVENTIONS: 97164- PT Re-evaluation, 97750- Physical Performance Testing, 97110-Therapeutic exercises, 97530- Therapeutic activity, [id number]- Neuromuscular re-education, 97535- Self Care, [id number]- Manual therapy, [id number]- Gait training, [phone]- Aquatic Therapy, [phone]- Electrical stimulation (manual), Patient/Family education, Balance training, Stair training, Taping, Joint mobilization, DME instructions, Cryotherapy, and Moist heat  PLAN FOR NEXT SESSION: how is L knee AROM? Initiate HEP to work on L quad and HS strengthening (mini-squats, eccentric step downs, SAQ/LAQ, leg press, SLS, stance on compliant surface, L hip strength?   Waddell Southgate, PT Waddell Southgate, PT, DPT, CSRS  09/28/2024, 8:39 AM

## 2024-10-02 ENCOUNTER — Encounter: Attending: Physical Medicine & Rehabilitation | Admitting: Physical Medicine & Rehabilitation

## 2024-10-02 ENCOUNTER — Ambulatory Visit: Admitting: Physical Therapy

## 2024-10-02 ENCOUNTER — Encounter: Payer: Self-pay | Admitting: Physical Medicine & Rehabilitation

## 2024-10-02 ENCOUNTER — Ambulatory Visit: Admitting: Primary Care

## 2024-10-02 VITALS — BP 111/73 | HR 79 | Ht 67.0 in | Wt 143.8 lb

## 2024-10-02 DIAGNOSIS — M6281 Muscle weakness (generalized): Secondary | ICD-10-CM

## 2024-10-02 DIAGNOSIS — I639 Cerebral infarction, unspecified: Secondary | ICD-10-CM | POA: Diagnosis not present

## 2024-10-02 DIAGNOSIS — M25562 Pain in left knee: Secondary | ICD-10-CM

## 2024-10-02 DIAGNOSIS — R2681 Unsteadiness on feet: Secondary | ICD-10-CM

## 2024-10-02 DIAGNOSIS — G8114 Spastic hemiplegia affecting left nondominant side: Secondary | ICD-10-CM | POA: Insufficient documentation

## 2024-10-02 DIAGNOSIS — R2689 Other abnormalities of gait and mobility: Secondary | ICD-10-CM

## 2024-10-02 NOTE — Therapy (Signed)
 OUTPATIENT PHYSICAL THERAPY NEURO TREATMENT   Patient Name: Faith Jordan MRN: [id number] DOB:05-Sep-1996, 28 y.o., female Today's Date: 10/02/2024   PCP: Gretta Comer POUR, NP REFERRING PROVIDER: Persons, Ronal Dragon, [place]  END OF SESSION:  PT End of Session - 10/02/24 1613     Visit Number 2    Number of Visits 7   with eval   Date for Recertification  11/23/24    Authorization Type [employer]    PT Start Time [phone]    PT Stop Time 1700    PT Time Calculation (min) 48 min    Equipment Utilized During Treatment Gait belt    Activity Tolerance Patient limited by pain    Behavior During Therapy [facility] for tasks assessed/performed           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   Acute flank pain 06/02/2022   Angio-edema 04/27/2016   Angioedema    Aquagenic angio-edema-urticaria    Asthma    no problems /not used recently   Dysautonomia (HCC)    Dysrhythmia    sinus tachycardia   Fibroid    right breast, adenoma   Headache(784.0)    History of COVID-19 11/21/2020   Hives 12/22/2015   Pneumonia    hx  6th grade   Sensation of fullness in both ears 02/02/2023   Snoring 02/11/2022   Urinary frequency 02/12/2019   Vaginal yeast infection 06/02/2022   Vasculitis    Past Surgical History:  Procedure Laterality Date   ADENOIDECTOMY     BREAST LUMPECTOMY Right    BREAST LUMPECTOMY Right    MASS EXCISION Right 10/07/2014   Procedure: EXCISION OF RIGHT BREAST MASS;  Surgeon: Krystal Russell, MD;  Location: [facility] OR;  Service: General;  Laterality: Right;   TONSILLECTOMY AND ADENOIDECTOMY     TOOTH EXTRACTION     TRANSESOPHAGEAL ECHOCARDIOGRAM (CATH LAB) N/A 01/10/2024   Procedure: TRANSESOPHAGEAL ECHOCARDIOGRAM;  Surgeon: Jeffrie Oneil BROCKS, MD;  Location: [facility] INVASIVE CV LAB;  Service: Cardiovascular;  Laterality: N/A;   Patient Active Problem List   Diagnosis Date Noted   Pain in left knee 09/18/2024   Pain in  left ankle and joints of left foot 09/18/2024   Chronic nausea 06/04/2024   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) 04/09/2024   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 (CVA) 04/05/2024   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) 02/10/2024   Coping style affecting medical condition 01/19/2024   History of CVA (cerebrovascular accident) 01/12/2024   Stroke of right basal ganglia (HCC) 01/07/2024   Non-restorative sleep 05/26/2022   Sleep paralysis, 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   Vivid dream 05/26/2022   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   Excessive daytime sleepiness 05/26/2022   Sleep disturbance 03/17/2022   Panic attacks 02/11/2022   Vitamin B12 deficiency 07/30/2021   Vitamin D  deficiency 07/30/2021   Family history of hypothyroidism 07/30/2021   GAD (generalized anxiety disorder) 03/18/2020   Fatigue 11/06/2019   Chronic back pain 07/06/2019   Preventative health care 04/27/2018   Migraines 04/25/2018   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   Anemia 01/04/2018   Headache disorder 12/29/2015   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   Sinus tachycardia 12/22/2015   Dysautonomia (HCC) 12/22/2015   Dysautonomia, familial (HCC) 04/22/2015   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   Neurocardiogenic syncope 04/06/2013   Intermittent palpitations 04/06/2013  ONSET DATE: 09/18/2024  REFERRING DIAG: F74.437 (ICD-10-CM) - Acute pain of left knee  THERAPY DIAG:  Muscle weakness (generalized)  Unsteadiness on feet  Other abnormalities of gait and mobility  Left knee pain, unspecified chronicity  Rationale for Evaluation and Treatment: Rehabilitation  SUBJECTIVE:                                                                                                                                                                                              SUBJECTIVE STATEMENT: Faith Jordan  Pt reports that her pain is okay today, she has been working on straightening her leg more so has gotten more ROM but it does still hurt to bend/straighten it. She feels like it locks up if she tries to straighten it while walking, it locks up and she can't unlock it quickly.   Pt accompanied by: self  PERTINENT HISTORY: PMH: familiar hemiplegic migraine, angioedema 2017,dysautonomia; infarct right internal capsule/ R basal ganglia stroke  PAIN:  Are you having pain? Yes: NPRS scale: 1/10 currently, 8/10 at worst Pain location: L knee (back of knee), tenderness along lateral joint line Pain description: sharp Aggravating factors: bending L knee all the way or straightening it all the way, standing/weight bearing Relieving factors: propping it up with pillow, sitting, resting  PRECAUTIONS: Fall  RED FLAGS: None   WEIGHT BEARING RESTRICTIONS: No  FALLS: Has patient fallen in last 6 months? Yes. [place], more recently down the stairs leading to this injury  LIVING ENVIRONMENT: Lives with: lives alone Lives in: House/apartment Stairs: Yes: Internal: 12 steps; on right going up, has to use arms quite a bit on her stairs - hard to just have weight on LLE Has following equipment at home: None  PLOF: Independent  PATIENT GOALS: to walk better gain strength  OBJECTIVE:  Note: Objective measures were completed at Evaluation unless otherwise noted.  DIAGNOSTIC FINDINGS:   L knee Xray 09/15/2024 CLINICAL HISTORY: knee pain sp fall   FINDINGS:   BONES AND JOINTS: No acute fracture. No focal osseous lesion. No joint dislocation. No significant joint effusion. No significant degenerative changes.   SOFT TISSUES: The soft tissues are unremarkable.   IMPRESSION: 1. Negative.  L foot xray 09/15/2024 CLINICAL HISTORY: Foot  pain sp fall   FINDINGS:   BONES AND JOINTS: No acute fracture. No focal osseous  lesion. No joint dislocation.   SOFT TISSUES: The soft tissues are unremarkable.   IMPRESSION: 1. No acute fracture or dislocation.  COGNITION: Overall cognitive status: Within functional limits for tasks assessed   SENSATION: Appears to be  WFL No N/T  COORDINATION: Slightly impaired LLE  EDEMA:  Around L knee, down L shin, in L foot  TONE: clonus in LLE  POSTURE: rounded shoulders and forward head  PALPATION: No pain with PROM, pain with AROM into extension More pain with patellar mobility medially and superiorly vs laterally and inferiorly, hurts in back of knee Most tenderness along lateral joint line   LOWER EXTREMITY ROM:     Active  Right Eval Left Eval  Hip flexion WFL   Hip extension    Hip abduction    Hip adduction    Hip internal rotation    Hip external rotation    Knee flexion  WFL  Knee extension  0-40  Ankle dorsiflexion    Ankle plantarflexion    Ankle inversion    Ankle eversion     (Blank rows = not tested)  LOWER EXTREMITY MMT:    MMT Right Eval Left Eval  Hip flexion 5 5  Hip extension    Hip abduction    Hip adduction    Hip internal rotation    Hip external rotation    Knee flexion 5 4  Knee extension 5 4  Ankle dorsiflexion 5 5  Ankle plantarflexion    Ankle inversion    Ankle eversion    (Blank rows = not tested)  BED MOBILITY:  WFL per pt report  TRANSFERS: Sit to stand: Modified independence  Assistive device utilized: None     Stand to sit: Modified independence  Assistive device utilized: None     Chair to chair: Modified independence  Assistive device utilized: None       RAMP:  Not tested  CURB:  Not tested  STAIRS:  STAIRS:  Level of Assistance: Modified independence  Stair Negotiation Technique: Alternating Pattern  with Single Rail on Right  Number of Stairs: 6   Height of Stairs: 4  Comments: intermittent rail support   GAIT: Findings:  GAIT: Gait pattern: decreased stance time- Left,  decreased hip/knee flexion- Left, circumduction- Left, and antalgic Distance walked: various clinic distances Assistive device utilized: None Level of assistance: Modified independence Comments: see above  FUNCTIONAL TESTS:        LOWER EXTREMITY SPECIAL TESTS:  Knee special tests: Anterior drawer test: positive , Posterior drawer test: negative, Apley's compression test: negative, McMurray's test: negative, and Patellafemoral apprehension test: negative                                                                                                                               TREATMENT DATE:   TherAct To work on increasing L knee ROM: Supine heel slides x 15 reps Unable to reach full extension without knee locking up and unable to flex knee or unlock it due to spasticity in quad muscles Feels clicking on lateral portion of joint line with flexion, able to palpate this as well Supine SAQ x 5 reps Increase in pain along  back of knee with attempts to extend knee Supine quad sets x 10 reps with 5 sec hold More tolerable than SAQ Assessed L hip abd strength in SL, 4+/5 In sidelying on powderboard Knee flexion/extension 2 x 10 reps Able to achieve full ROM Reassessed L anterior and posterior drawer test Increased pain with anterior drawer test but no increased mobility as compared to R knee To work on increasing L knee AROM and strength: In // bars with intermittent UE support: 4 eccentric step downs 3 x 10 reps Difficulty with quad activation 4 LLE step ups 3 x 10 reps Much easier than eccentric step downs but still has difficulty unlocking knee Squats x 10 reps  Onset of L quad clonus in extension Spanish squats x 10 reps Onset of L quad clonus in full extension, decreased if she avoids end range extension   PATIENT EDUCATION: Education details: initial HEP, avoid full range extension with exercises to avoid setting off clonus Person educated: Patient Education  method: Programmer, Multimedia, Demonstration, and Handouts Education comprehension: verbalized understanding, returned demonstration, and needs further education  HOME EXERCISE PROGRAM: Verbally encouraged L knee full ROM ext/flex - without locking out knee into extension  Access Code: [email or web address] URL: [email or web address] Date: 10/02/2024 Prepared by: Waddell Southgate  Exercises - Supine Heel Slide  - 1 x daily - 7 x weekly - 3 sets - 10 reps - Supine Quad Set  - 1 x daily - 7 x weekly - 3 sets - 10 reps - 5 sec hold - Small-Range Spanish Squat With Resistance  - 1 x daily - 7 x weekly - 3 sets - 10 reps  GOALS: Goals reviewed with patient? Yes  SHORT TERM GOALS: Target date: 10/19/2024   Pt will be independent with initial HEP for improved strength, balance, transfers and gait as well as management of pain symptoms. Baseline: Goal status: INITIAL   LONG TERM GOALS: Target date: 11/09/2024   Pt will be independent with final HEP for improved strength, balance, transfers and gait as well as management of pain symptoms. Baseline:  Goal status: INITIAL  2.  Pt will improve FGA to 20/30 for decreased fall risk  Baseline: 16/30 (12/12) Goal status: INITIAL  3.  Pt will improve her L knee extension ROM to 0 degrees with increase in pain </= 2/10 from baseline Baseline: 40 degrees with 8/10 pain (12/12) Goal status: INITIAL    ASSESSMENT:  CLINICAL IMPRESSION: Emphasis of skilled PT session on initiating HEP to work on L knee ROM and quad strengthening. Pt with difficulty fully extending her knee and when she does it locks up into extension and sets of clonus in her L quad that she is unable to break and she is unable to flex her knee. She also has ongoing pain in her posterior L knee and feels a clicking sensation with knee flexion. She is able to perform full L knee ROM in gravity-eliminated position in sidelying on powderboard with no pain and no clonus. She also exhibits  onset of L quad clonus in standing with step ups and eccentric step downs when her knee is fully extended. Encouraged her to perform HEP avoiding full knee extension in order to avoid setting off her clonus. Will further assess this next visit as well as focus on hamstring strengthening. Continue POC.    OBJECTIVE IMPAIRMENTS: Abnormal gait, decreased activity tolerance, decreased balance, decreased coordination, difficulty walking, decreased ROM, decreased strength, increased edema, impaired perceived functional ability, increased muscle spasms, impaired tone, improper  body mechanics, postural dysfunction, and pain.   ACTIVITY LIMITATIONS: bending, sitting, standing, squatting, stairs, and transfers  PARTICIPATION LIMITATIONS: community activity  PERSONAL FACTORS: 1-2 comorbidities:   familiar hemiplegic migraine, angioedema 2017,dysautonomia; infarct right internal capsule/ R basal ganglia strokeare also affecting patient's functional outcome.   REHAB POTENTIAL: Excellent  CLINICAL DECISION MAKING: Evolving/moderate complexity  EVALUATION COMPLEXITY: Moderate  PLAN:  PT FREQUENCY: 1x/week  PT DURATION: 6 weeks  PLANNED INTERVENTIONS: 97164- PT Re-evaluation, 97750- Physical Performance Testing, 97110-Therapeutic exercises, 97530- Therapeutic activity, [id number]- Neuromuscular re-education, 97535- Self Care, [id number]- Manual therapy, [id number]- Gait training, [phone]- Aquatic Therapy, [phone]- Electrical stimulation (manual), Patient/Family education, Balance training, Stair training, Taping, Joint mobilization, DME instructions, Cryotherapy, and Moist heat  PLAN FOR NEXT SESSION: how is L knee AROM? How is initial HEP? Add to HEP to work on L quad and HS strengthening (leg press, SLS, stance on compliant surface, bridge with HS curl, seated resisted HS curls, standing resisted HS curls, prone contract/relax of HS), estim? Does she need a L knee MRI?   Makynna Manocchio, PT Waddell Southgate, PT, DPT,  CSRS  10/02/2024, 5:00 PM

## 2024-10-02 NOTE — Progress Notes (Signed)
 Subjective:    Patient ID: Faith Jordan, female    DOB: [birth year], 28 y.o.   MRN: [id number]  HPI Discussed the use of AI scribe software for clinical note transcription with the patient, who gave verbal consent to proceed.  History of Present Illness Faith Jordan [name] is a 28 year old female who presents with left knee pain following a fall.  She fell down the stairs at home, resulting in her left knee buckling. She experiences persistent pain in the knee, which has made walking difficult. Initially, she used ice and took Mobic  a few times, but found it ineffective and discontinued its use. She continues to elevate her leg at home due to pain when attempting to straighten it.  She describes difficulty in straightening her knee, stating that it feels 'stuck' when trying to bend or straighten it. She also reports increased clonus in her leg, which occurs throughout the whole leg and worsens when standing. She has increased her baclofen  dosage to 10 mg twice a day, occasionally taking a third dose if needed, without significant drowsiness.  She mentions tenderness in the knee, particularly where it inserts on the medial side, and notes some swelling and bruising in her leg, which she attributes to being on Plavix . Initially, she required crutches as she could not tolerate weight on the leg, but is now able to walk, albeit with difficulty and increased stiffness after sitting.  She continues to work but at a slower pace due to her knee issues. She is undergoing physical therapy and is trying to balance stretching and exercise to aid recovery without exacerbating her symptoms.   Pain Inventory Average Pain 5 Pain Right Now 1 My pain is sharp and stabbing  In the last 24 hours, has pain interfered with the following? General activity 10 Relation with others 5 Enjoyment of life 10 What TIME of day is your pain at its worst? morning , daytime, evening, and night Sleep  (in general) Fair  Pain is worse with: walking and standing Pain improves with: rest Relief from Meds: na  Family History  Problem Relation Age of Onset   Depression Mother    Migraines Mother    Narcolepsy Mother    Hypothyroidism Mother    Breast cancer Maternal Grandmother 27   Myasthenia gravis Maternal Grandfather    Asthma Other    Cancer Other    Epilepsy Other    Allergic rhinitis Neg Hx    Angioedema Neg Hx    Atopy Neg Hx    Eczema Neg Hx    Immunodeficiency Neg Hx    Urticaria Neg Hx    Stroke Neg Hx    Social History   Socioeconomic History   Marital status: Significant Other    Spouse name: Not on file   Number of children: Not on file   Years of education: Not on file   Highest education level: Bachelor's degree (e.g., BA, AB, BS)  Occupational History   Not on file  Tobacco Use   Smoking status: Never   Smokeless tobacco: Never  Vaping Use   Vaping status: Never Used  Substance and Sexual Activity   Alcohol use: No   Drug use: No   Sexual activity: Yes  Other Topics Concern   Not on file  Social History Narrative   Lives with fiance   R handed   Caffeine : 1 drink a day   Social Drivers of Health   Tobacco Use: Low Risk (09/26/2024)  Patient History    Smoking Tobacco Use: Never    Smokeless Tobacco Use: Never    Passive Exposure: Not on file  Financial Resource Strain: Not on file  Food Insecurity: No Food Insecurity (01/09/2024)   Hunger Vital Sign    Worried About Running Out of Food in the Last Year: Never true    Ran Out of Food in the Last Year: Never true  Transportation Needs: No Transportation Needs (01/09/2024)   PRAPARE - Administrator, Civil Service (Medical): No    Lack of Transportation (Non-Medical): No  Physical Activity: Not on file  Stress: Not on file  Social Connections: Not on file  Depression (PHQ2-9): Low Risk (10/02/2024)   Depression (PHQ2-9)    PHQ-2 Score: 0  Alcohol Screen: Not on file   Housing: Unknown (04/11/2024)   Received from [facility]   Epic    Unable to Pay for Housing in the Last Year: Not on file    Number of Times Moved in the Last Year: Not on file    At any time in the past 12 months, were you homeless or living in a shelter (including now)?: No  Utilities: Not At Risk (01/09/2024)   [facility] Utilities    Threatened with loss of utilities: No  Health Literacy: Not on file   Past Surgical History:  Procedure Laterality Date   ADENOIDECTOMY     BREAST LUMPECTOMY Right    BREAST LUMPECTOMY Right    MASS EXCISION Right 10/07/2014   Procedure: EXCISION OF RIGHT BREAST MASS;  Surgeon: Krystal Russell, MD;  Location: [facility] OR;  Service: General;  Laterality: Right;   TONSILLECTOMY AND ADENOIDECTOMY     TOOTH EXTRACTION     TRANSESOPHAGEAL ECHOCARDIOGRAM (CATH LAB) N/A 01/10/2024   Procedure: TRANSESOPHAGEAL ECHOCARDIOGRAM;  Surgeon: Jeffrie Oneil BROCKS, MD;  Location: [facility] INVASIVE CV LAB;  Service: Cardiovascular;  Laterality: N/A;   Past Surgical History:  Procedure Laterality Date   ADENOIDECTOMY     BREAST LUMPECTOMY Right    BREAST LUMPECTOMY Right    MASS EXCISION Right 10/07/2014   Procedure: EXCISION OF RIGHT BREAST MASS;  Surgeon: Krystal Russell, MD;  Location: [facility] OR;  Service: General;  Laterality: Right;   TONSILLECTOMY AND ADENOIDECTOMY     TOOTH EXTRACTION     TRANSESOPHAGEAL ECHOCARDIOGRAM (CATH LAB) N/A 01/10/2024   Procedure: TRANSESOPHAGEAL ECHOCARDIOGRAM;  Surgeon: Jeffrie Oneil BROCKS, MD;  Location: [facility] INVASIVE CV LAB;  Service: Cardiovascular;  Laterality: N/A;   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   Acute flank pain 06/02/2022   Angio-edema 04/27/2016   Angioedema    Aquagenic angio-edema-urticaria    Asthma    no problems /not used recently   Dysautonomia [facility])    Dysrhythmia    sinus tachycardia   Fibroid    right breast, adenoma   Headache(784.0)    History of COVID-19  11/21/2020   Hives 12/22/2015   Pneumonia    hx  6th grade   Sensation of fullness in both ears 02/02/2023   Snoring 02/11/2022   Urinary frequency 02/12/2019   Vaginal yeast infection 06/02/2022   Vasculitis    BP 111/73   Pulse 79   Ht 5' 7 (1.702 m)   Wt 143 lb 12.8 oz (65.2 kg)   LMP 09/04/2024 (Within Days)   SpO2 97%   BMI 22.52 kg/m   Opioid Risk Score:   Fall Risk Score:  `  1  Depression screen [facility] 2/9     10/02/2024   11:46 AM 07/03/2024   10:57 AM 05/24/2024   10:37 AM 02/10/2024    1:44 PM 02/08/2024    8:36 AM 11/29/2023    8:08 AM 06/10/2022    2:28 PM  Depression screen PHQ 2/9  Decreased Interest 0 0 2 2 1  0   Down, Depressed, Hopeless 0 0 2 2 2  0   PHQ - 2 Score 0 0 4 4 3  0   Altered sleeping    2 0 0   Tired, decreased energy    2 2 0   Change in appetite    2 0 0   Feeling bad or failure about yourself     2 0 0   Trouble concentrating    0 0 0   Moving slowly or fidgety/restless    0 2 0   Suicidal thoughts    0 0 0   PHQ-9 Score    12  7  0    Difficult doing work/chores    Very difficult Not difficult at all Not difficult at all      Information is confidential and restricted. Go to Review Flowsheets to unlock data.   Data saved with a previous flowsheet row definition     Review of Systems  Musculoskeletal:  Positive for gait problem.  All other systems reviewed and are negative.      Objective:   Physical Exam  General no acute distress Mood affect appropriate Extremities without edema Bruising over the anterior shin on the left side only No evidence of left knee effusion.  No tenderness of the patellar or quadricep tendon there is tenderness over the both medial and lateral hamstring insertions Pain at end range with knee extension this is at the hamstring tendons. Ambulates without assistive device no evidence toe drag or knee instability Tone 3+ reflexes at the left patellar, clonus sustained in the left ankle.      Assessment  & Plan:  Assessment and Plan Assessment & Plan Left hamstring strain with pain and swelling Persistent pain and swelling post-fall, exacerbated by Plavix . No intra-articular involvement. Conservative management expected to improve symptoms over months. - Continue physical therapy with therapist guidance. - Use Voltaren gel for pain relief. - Avoid pain-provoking activities. - Elevate leg when possible. - Follow up with orthopedic specialist at month's end.  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 Clonus may be exacerbated by pain and spasticity. Botox  considered if clonus persists. - Continue baclofen  10 mg twice daily, optional third dose as needed. - Consider Botox  in calf muscle if clonus persists. - Monitor for drowsiness with increased baclofen .

## 2024-10-02 NOTE — Patient Instructions (Signed)
°  VISIT SUMMARY: You visited us  today due to left knee pain following a fall. We discussed your knee pain, swelling, and difficulty in movement, as well as your ongoing issues with spastic hemiparesis.  YOUR PLAN: LEFT HAMSTRING STRAIN WITH PAIN AND SWELLING: You have a strain in your left hamstring with associated pain and swelling, which has been worsened by your use of Plavix . There is no involvement of the joint itself. -Continue with physical therapy as guided by your therapist. -Use Voltaren gel to help relieve pain. -Avoid activities that cause pain. -Elevate your leg whenever possible. -Follow up with an orthopedic specialist at the end of the month.  SPASTIC HEMIPARESIS OF LEFT NONDOMINANT SIDE DUE TO RIGHT BASAL GANGLIA INFARCT: You have increased clonus (muscle spasms) in your left leg, which may be worsened by pain and spasticity. -Continue taking baclofen  10 mg twice daily, with an optional third dose if needed. -Consider Botox  injections in your calf muscle if the clonus persists. -Monitor for any drowsiness with the increased baclofen  dosage.                      Contains text generated by Abridge.                                 Contains text generated by Abridge.

## 2024-10-07 ENCOUNTER — Telehealth: Admitting: Family Medicine

## 2024-10-07 DIAGNOSIS — J020 Streptococcal pharyngitis: Secondary | ICD-10-CM | POA: Diagnosis not present

## 2024-10-07 MED ORDER — AMOXICILLIN 875 MG PO TABS
875.0000 mg | ORAL_TABLET | Freq: Two times a day (BID) | ORAL | 0 refills | Status: AC
  Filled 2024-10-07: qty 20, 10d supply, fill #0

## 2024-10-07 NOTE — Progress Notes (Signed)

## 2024-10-08 ENCOUNTER — Other Ambulatory Visit: Payer: Self-pay

## 2024-10-09 ENCOUNTER — Ambulatory Visit: Admitting: Psychology

## 2024-10-12 ENCOUNTER — Ambulatory Visit: Admitting: Physical Therapy

## 2024-10-12 NOTE — Therapy (Incomplete)
 " OUTPATIENT PHYSICAL THERAPY NEURO TREATMENT   Patient Name: Faith Jordan MRN: [id number] DOB:1996-04-17, 28 y.o., female Today's Date: 10/12/2024   PCP: Gretta Comer POUR, NP REFERRING PROVIDER: Persons, Ronal Dragon, [place]  END OF SESSION:     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   Acute flank pain 06/02/2022   Angio-edema 04/27/2016   Angioedema    Aquagenic angio-edema-urticaria    Asthma    no problems /not used recently   Dysautonomia [facility])    Dysrhythmia    sinus tachycardia   Fibroid    right breast, adenoma   Headache(784.0)    History of COVID-19 11/21/2020   Hives 12/22/2015   Pneumonia    hx  6th grade   Sensation of fullness in both ears 02/02/2023   Snoring 02/11/2022   Urinary frequency 02/12/2019   Vaginal yeast infection 06/02/2022   Vasculitis    Past Surgical History:  Procedure Laterality Date   ADENOIDECTOMY     BREAST LUMPECTOMY Right    BREAST LUMPECTOMY Right    MASS EXCISION Right 10/07/2014   Procedure: EXCISION OF RIGHT BREAST MASS;  Surgeon: Krystal Russell, MD;  Location: [facility] OR;  Service: General;  Laterality: Right;   TONSILLECTOMY AND ADENOIDECTOMY     TOOTH EXTRACTION     TRANSESOPHAGEAL ECHOCARDIOGRAM (CATH LAB) N/A 01/10/2024   Procedure: TRANSESOPHAGEAL ECHOCARDIOGRAM;  Surgeon: Jeffrie Oneil BROCKS, MD;  Location: [facility] INVASIVE CV LAB;  Service: Cardiovascular;  Laterality: N/A;   Patient Active Problem List   Diagnosis Date Noted   Pain in left knee 09/18/2024   Pain in left ankle and joints of left foot 09/18/2024   Chronic nausea 06/04/2024   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) 04/09/2024   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 (CVA) 04/05/2024   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) 02/10/2024   Coping style affecting medical condition 01/19/2024    History of CVA (cerebrovascular accident) 01/12/2024   Stroke of right basal ganglia (HCC) 01/07/2024   Non-restorative sleep 05/26/2022   Sleep paralysis, 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   Vivid dream 05/26/2022   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   Excessive daytime sleepiness 05/26/2022   Sleep disturbance 03/17/2022   Panic attacks 02/11/2022   Vitamin B12 deficiency 07/30/2021   Vitamin D  deficiency 07/30/2021   Family history of hypothyroidism 07/30/2021   GAD (generalized anxiety disorder) 03/18/2020   Fatigue 11/06/2019   Chronic back pain 07/06/2019   Preventative health care 04/27/2018   Migraines 04/25/2018   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  Rationale for Evaluation and Treatment: Rehabilitation  SUBJECTIVE:  SUBJECTIVE STATEMENT: [name]  Pt reports that her pain is okay today, she has been working on straightening her leg more so has gotten more ROM but it does still hurt to bend/straighten it. She feels like it locks up if she tries to straighten it while walking, it locks up and she can't unlock it quickly.  ***   Pt accompanied by: self  PERTINENT HISTORY: PMH: familiar hemiplegic migraine, angioedema 2017,dysautonomia; infarct right internal capsule/ R basal ganglia stroke  PAIN:  Are you having pain? Yes: NPRS scale: 1/10  currently, 8/10 at worst Pain location: L knee (back of knee), tenderness along lateral joint line Pain description: sharp Aggravating factors: bending L knee all the way or straightening it all the way, standing/weight bearing Relieving factors: propping it up with pillow, sitting, resting  PRECAUTIONS: Fall  RED FLAGS: None   WEIGHT BEARING RESTRICTIONS: No  FALLS: Has patient fallen in last 6 months? Yes. [place], more recently down the stairs leading to this injury  LIVING ENVIRONMENT: Lives with: lives alone Lives in: House/apartment Stairs: Yes: Internal: 12 steps; on right going up, has to use arms quite a bit on her stairs - hard to just have weight on LLE Has following equipment at home: None  PLOF: Independent  PATIENT GOALS: to walk better gain strength  OBJECTIVE:  Note: Objective measures were completed at Evaluation unless otherwise noted.  DIAGNOSTIC FINDINGS:   L knee Xray 09/15/2024 CLINICAL HISTORY: knee pain sp fall   FINDINGS:   BONES AND JOINTS: No acute fracture. No focal osseous lesion. No joint dislocation. No significant joint effusion. No significant degenerative changes.   SOFT TISSUES: The soft tissues are unremarkable.   IMPRESSION: 1. Negative.  L foot xray 09/15/2024 CLINICAL HISTORY: Foot  pain sp fall   FINDINGS:   BONES AND JOINTS: No acute fracture. No focal osseous lesion. No joint dislocation.   SOFT TISSUES: The soft tissues are unremarkable.   IMPRESSION: 1. No acute fracture or dislocation.  COGNITION: Overall cognitive status: Within functional limits for tasks assessed   SENSATION: Appears to be [facility] No N/T  COORDINATION: Slightly impaired LLE  EDEMA:  Around L knee, down L shin, in L foot  TONE: clonus in LLE  POSTURE: rounded shoulders and forward head  PALPATION: No pain with PROM, pain with AROM into extension More pain with patellar mobility medially and  superiorly vs laterally and inferiorly, hurts in back of knee Most tenderness along lateral joint line   LOWER EXTREMITY ROM:     Active  Right Eval Left Eval  Hip flexion WFL   Hip extension    Hip abduction    Hip adduction    Hip internal rotation    Hip external rotation    Knee flexion  WFL  Knee extension  0-40  Ankle dorsiflexion    Ankle plantarflexion    Ankle inversion    Ankle eversion     (Blank rows = not tested)  LOWER EXTREMITY MMT:    MMT Right Eval Left Eval  Hip flexion 5 5  Hip extension    Hip abduction    Hip adduction    Hip internal rotation    Hip external rotation    Knee flexion 5 4  Knee extension 5 4  Ankle dorsiflexion 5 5  Ankle plantarflexion    Ankle inversion    Ankle eversion    (Blank rows = not tested)  BED MOBILITY:  WFL per pt report  TRANSFERS: Sit to stand: Modified independence  Assistive device utilized: None     Stand to sit: Modified independence  Assistive device utilized: None     Chair to chair: Modified independence  Assistive device utilized: None       RAMP:  Not tested  CURB:  Not tested  STAIRS:  STAIRS:  Level of Assistance: Modified independence  Stair Negotiation Technique: Alternating Pattern  with Single Rail on Right  Number of Stairs: 6   Height of Stairs: 4  Comments: intermittent rail support   GAIT: Findings:  GAIT: Gait pattern: decreased stance time- Left, decreased hip/knee flexion- Left, circumduction- Left, and antalgic Distance walked: various clinic distances Assistive device utilized: None Level of assistance: Modified independence Comments: see above  FUNCTIONAL TESTS:        LOWER EXTREMITY SPECIAL TESTS:  Knee special tests: Anterior drawer test: positive , Posterior drawer test: negative, Apley's compression test: negative, McMurray's test: negative, and Patellafemoral apprehension test: negative                                                                                                                                TREATMENT DATE:   TherAct To work on increasing L knee ROM: Supine heel slides x 15 reps Unable to reach full extension without knee locking up and unable to flex knee or unlock it due to spasticity in quad muscles Feels clicking on lateral portion of joint line with flexion, able to palpate this as well Supine SAQ x 5 reps Increase in pain along back of knee with attempts to extend knee Supine quad sets x 10 reps with 5 sec hold More tolerable than SAQ Assessed L hip abd strength in SL, 4+/5 In sidelying on powderboard Knee flexion/extension 2 x 10 reps Able to achieve full ROM Reassessed L anterior and posterior drawer test Increased pain with anterior drawer test but no increased mobility as compared to R knee *** To work on increasing L knee AROM and strength: In // bars with intermittent UE support: 4 eccentric step downs 3 x 10 reps Difficulty with quad activation 4 LLE step ups 3 x 10 reps Much easier than eccentric step downs but still has difficulty unlocking knee Squats x 10 reps  Onset of L quad clonus in extension Spanish squats x 10 reps Onset of L quad clonus in full extension, decreased if she avoids end range extension ***   PATIENT EDUCATION: Education details: initial HEP, avoid full range extension with exercises to avoid setting off clonus*** Person educated: Patient Education method: Programmer, Multimedia, Demonstration, and Handouts Education comprehension: verbalized understanding, returned demonstration, and needs further education  HOME EXERCISE PROGRAM: Verbally encouraged L knee full ROM ext/flex - without locking out knee into extension  Access Code: [email or web address] URL: [email or web address] Date: 10/02/2024 Prepared by: Waddell Southgate  Exercises - Supine Heel Slide  - 1 x daily - 7 x weekly - 3  sets - 10 reps - Supine Quad Set  - 1 x daily - 7 x weekly - 3 sets - 10 reps - 5  sec hold - Small-Range Spanish Squat With Resistance  - 1 x daily - 7 x weekly - 3 sets - 10 reps  GOALS: Goals reviewed with patient? Yes  SHORT TERM GOALS: Target date: 10/19/2024   Pt will be independent with initial HEP for improved strength, balance, transfers and gait as well as management of pain symptoms. Baseline: Goal status: INITIAL   LONG TERM GOALS: Target date: 11/09/2024   Pt will be independent with final HEP for improved strength, balance, transfers and gait as well as management of pain symptoms. Baseline:  Goal status: INITIAL  2.  Pt will improve FGA to 20/30 for decreased fall risk  Baseline: 16/30 (12/12) Goal status: INITIAL  3.  Pt will improve her L knee extension ROM to 0 degrees with increase in pain </= 2/10 from baseline Baseline: 40 degrees with 8/10 pain (12/12) Goal status: INITIAL    ASSESSMENT:  CLINICAL IMPRESSION: Emphasis of skilled PT session on*** initiating HEP to work on L knee ROM and quad strengthening. Pt with difficulty fully extending her knee and when she does it locks up into extension and sets of clonus in her L quad that she is unable to break and she is unable to flex her knee. She also has ongoing pain in her posterior L knee and feels a clicking sensation with knee flexion. She is able to perform full L knee ROM in gravity-eliminated position in sidelying on powderboard with no pain and no clonus. She also exhibits onset of L quad clonus in standing with step ups and eccentric step downs when her knee is fully extended. Encouraged her to perform HEP avoiding full knee extension in order to avoid setting off her clonus. Will further assess this next visit as well as focus on hamstring strengthening. Continue POC.    OBJECTIVE IMPAIRMENTS: Abnormal gait, decreased activity tolerance, decreased balance, decreased coordination, difficulty walking, decreased ROM, decreased strength, increased edema, impaired perceived functional  ability, increased muscle spasms, impaired tone, improper body mechanics, postural dysfunction, and pain.   ACTIVITY LIMITATIONS: bending, sitting, standing, squatting, stairs, and transfers  PARTICIPATION LIMITATIONS: community activity  PERSONAL FACTORS: 1-2 comorbidities:   familiar hemiplegic migraine, angioedema 2017,dysautonomia; infarct right internal capsule/ R basal ganglia strokeare also affecting patient's functional outcome.   REHAB POTENTIAL: Excellent  CLINICAL DECISION MAKING: Evolving/moderate complexity  EVALUATION COMPLEXITY: Moderate  PLAN:  PT FREQUENCY: 1x/week  PT DURATION: 6 weeks  PLANNED INTERVENTIONS: [id number]- PT Re-evaluation, 97750- Physical Performance Testing, 97110-Therapeutic exercises, 97530- Therapeutic activity, [id number]- Neuromuscular re-education, 97535- Self Care, [id number]- Manual therapy, [id number]- Gait training, [phone]- Aquatic Therapy, [phone]- Electrical stimulation (manual), Patient/Family education, Balance training, Stair training, Taping, Joint mobilization, DME instructions, Cryotherapy, and Moist heat  PLAN FOR NEXT SESSION: how is L knee AROM? How is initial HEP? Add to HEP to work on L quad and HS strengthening (leg press, SLS, stance on compliant surface, bridge with HS curl, seated resisted HS curls, standing resisted HS curls, prone contract/relax of HS), estim? Does she need a L knee MRI?***   Waddell Southgate, PT Waddell Southgate, PT, DPT, CSRS  10/12/2024, 7:44 AM        "

## 2024-10-16 ENCOUNTER — Ambulatory Visit: Admitting: Physician Assistant

## 2024-10-19 ENCOUNTER — Ambulatory Visit: Attending: Physician Assistant

## 2024-10-19 DIAGNOSIS — M6281 Muscle weakness (generalized): Secondary | ICD-10-CM | POA: Diagnosis present

## 2024-10-19 DIAGNOSIS — R29818 Other symptoms and signs involving the nervous system: Secondary | ICD-10-CM | POA: Insufficient documentation

## 2024-10-19 DIAGNOSIS — R2681 Unsteadiness on feet: Secondary | ICD-10-CM | POA: Diagnosis present

## 2024-10-19 DIAGNOSIS — R278 Other lack of coordination: Secondary | ICD-10-CM | POA: Diagnosis present

## 2024-10-19 DIAGNOSIS — R29898 Other symptoms and signs involving the musculoskeletal system: Secondary | ICD-10-CM | POA: Insufficient documentation

## 2024-10-19 DIAGNOSIS — R2689 Other abnormalities of gait and mobility: Secondary | ICD-10-CM | POA: Insufficient documentation

## 2024-10-19 DIAGNOSIS — M25562 Pain in left knee: Secondary | ICD-10-CM | POA: Diagnosis present

## 2024-10-19 NOTE — Therapy (Signed)
 " OUTPATIENT PHYSICAL THERAPY NEURO TREATMENT   Patient Name: Faith Jordan MRN: [id number] DOB:27-Apr-1996, 29 y.o., female Today's Date: 10/19/2024   PCP: Gretta Comer POUR, NP REFERRING PROVIDER: Persons, Ronal Dragon, [place]  END OF SESSION:  PT End of Session - 10/19/24 0927     Visit Number 3    Number of Visits 7   with eval   Date for Recertification  11/23/24    Authorization Type [employer]    PT Start Time 0930    PT Stop Time 1015    PT Time Calculation (min) 45 min    Equipment Utilized During Treatment Gait belt    Activity Tolerance Patient limited by pain    Behavior During Therapy [facility] for tasks assessed/performed            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   Acute flank pain 06/02/2022   Angio-edema 04/27/2016   Angioedema    Aquagenic angio-edema-urticaria    Asthma    no problems /not used recently   Dysautonomia (HCC)    Dysrhythmia    sinus tachycardia   Fibroid    right breast, adenoma   Headache(784.0)    History of COVID-19 11/21/2020   Hives 12/22/2015   Pneumonia    hx  6th grade   Sensation of fullness in both ears 02/02/2023   Snoring 02/11/2022   Urinary frequency 02/12/2019   Vaginal yeast infection 06/02/2022   Vasculitis    Past Surgical History:  Procedure Laterality Date   ADENOIDECTOMY     BREAST LUMPECTOMY Right    BREAST LUMPECTOMY Right    MASS EXCISION Right 10/07/2014   Procedure: EXCISION OF RIGHT BREAST MASS;  Surgeon: Krystal Russell, MD;  Location: [facility] OR;  Service: General;  Laterality: Right;   TONSILLECTOMY AND ADENOIDECTOMY     TOOTH EXTRACTION     TRANSESOPHAGEAL ECHOCARDIOGRAM (CATH LAB) N/A 01/10/2024   Procedure: TRANSESOPHAGEAL ECHOCARDIOGRAM;  Surgeon: Jeffrie Oneil BROCKS, MD;  Location: [facility] INVASIVE CV LAB;  Service: Cardiovascular;  Laterality: N/A;   Patient Active Problem List   Diagnosis Date Noted   Pain in left knee 09/18/2024   Pain in  left ankle and joints of left foot 09/18/2024   Chronic nausea 06/04/2024   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) 04/09/2024   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 (CVA) 04/05/2024   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) 02/10/2024   Coping style affecting medical condition 01/19/2024   History of CVA (cerebrovascular accident) 01/12/2024   Stroke of right basal ganglia (HCC) 01/07/2024   Non-restorative sleep 05/26/2022   Sleep paralysis, 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   Vivid dream 05/26/2022   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   Excessive daytime sleepiness 05/26/2022   Sleep disturbance 03/17/2022   Panic attacks 02/11/2022   Vitamin B12 deficiency 07/30/2021   Vitamin D  deficiency 07/30/2021   Family history of hypothyroidism 07/30/2021   GAD (generalized anxiety disorder) 03/18/2020   Fatigue 11/06/2019   Chronic back pain 07/06/2019   Preventative health care 04/27/2018   Migraines 04/25/2018   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   Anemia 01/04/2018   Headache disorder 12/29/2015   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   Sinus tachycardia 12/22/2015   Dysautonomia (HCC) 12/22/2015   Dysautonomia, familial (HCC) 04/22/2015   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   Neurocardiogenic syncope 04/06/2013   Intermittent  palpitations 04/06/2013    ONSET DATE: 09/18/2024  REFERRING DIAG: M25.562 (ICD-10-CM) - Acute pain of left knee  THERAPY DIAG:  Muscle weakness (generalized)  Unsteadiness on feet  Left knee pain, unspecified chronicity  Rationale for Evaluation and Treatment: Rehabilitation  SUBJECTIVE:                                                                                                                                                                                             SUBJECTIVE STATEMENT: [name]  Pt reports knee  pain is better. 2/10 currently on side of the knee and back of the knee. Pain is worst when going down the stairs, walking and deep squatting is painful.    Pt accompanied by: self  PERTINENT HISTORY: PMH: familiar hemiplegic migraine, angioedema 2017,dysautonomia; infarct right internal capsule/ R basal ganglia stroke  PAIN:  Are you having pain? Yes: NPRS scale: 2/10 pain Pain location: L knee (back of knee), tenderness along lateral joint line Pain description: sharp Aggravating factors: bending L knee all the way or straightening it all the way, standing/weight bearing Relieving factors: propping it up with pillow, sitting, resting  PRECAUTIONS: Fall  RED FLAGS: None   WEIGHT BEARING RESTRICTIONS: No  FALLS: Has patient fallen in last 6 months? Yes. [place], more recently down the stairs leading to this injury  LIVING ENVIRONMENT: Lives with: lives alone Lives in: House/apartment Stairs: Yes: Internal: 12 steps; on right going up, has to use arms quite a bit on her stairs - hard to just have weight on LLE Has following equipment at home: None  PLOF: Independent  PATIENT GOALS: to walk better gain strength  OBJECTIVE:  Note: Objective measures were completed at Evaluation unless otherwise noted.  DIAGNOSTIC FINDINGS:   L knee Xray 09/15/2024 CLINICAL HISTORY: knee pain sp fall   FINDINGS:   BONES AND JOINTS: No acute fracture. No focal osseous lesion. No joint dislocation. No significant joint effusion. No significant degenerative changes.   SOFT TISSUES: The soft tissues are unremarkable.   IMPRESSION: 1. Negative.  L foot xray 09/15/2024 CLINICAL HISTORY: Foot  pain sp fall   FINDINGS:   BONES AND JOINTS: No acute fracture. No focal osseous lesion. No joint dislocation.   SOFT TISSUES: The soft tissues are unremarkable.   IMPRESSION: 1. No acute fracture or dislocation.  COGNITION: Overall cognitive  status: Within functional limits for tasks assessed   SENSATION: Appears to be [facility] No N/T  COORDINATION: Slightly impaired LLE  EDEMA:  Around L knee, down L shin, in L foot  TONE: clonus in LLE  POSTURE: rounded shoulders and forward head  PALPATION: No pain with PROM, pain with AROM into extension More pain with patellar mobility medially and superiorly vs laterally and inferiorly, hurts in back of knee Most tenderness along lateral joint line   LOWER EXTREMITY ROM:     Active  Right Eval Left Eval  Hip flexion WFL   Hip extension    Hip abduction    Hip adduction    Hip internal rotation    Hip external rotation    Knee flexion  WFL  Knee extension  0-40  Ankle dorsiflexion    Ankle plantarflexion    Ankle inversion    Ankle eversion     (Blank rows = not tested)  LOWER EXTREMITY MMT:    MMT Right Eval Left Eval  Hip flexion 5 5  Hip extension    Hip abduction    Hip adduction    Hip internal rotation    Hip external rotation    Knee flexion 5 4  Knee extension 5 4  Ankle dorsiflexion 5 5  Ankle plantarflexion    Ankle inversion    Ankle eversion    (Blank rows = not tested)  BED MOBILITY:  WFL per pt report  TRANSFERS: Sit to stand: Modified independence  Assistive device utilized: None     Stand to sit: Modified independence  Assistive device utilized: None     Chair to chair: Modified independence  Assistive device utilized: None       RAMP:  Not tested  CURB:  Not tested  STAIRS:  STAIRS:  Level of Assistance: Modified independence  Stair Negotiation Technique: Alternating Pattern  with Single Rail on Right  Number of Stairs: 6   Height of Stairs: 4  Comments: intermittent rail support   GAIT: Findings:  GAIT: Gait pattern: decreased stance time- Left, decreased hip/knee flexion- Left, circumduction- Left, and antalgic Distance walked: various clinic distances Assistive device utilized: None Level of assistance: Modified  independence Comments: see above  FUNCTIONAL TESTS:        LOWER EXTREMITY SPECIAL TESTS:  Knee special tests: Anterior drawer test: positive , Posterior drawer test: negative, Apley's compression test: negative, McMurray's test: negative, and Patellafemoral apprehension test: negative                                                                                                                               TREATMENT DATE:   Valgus test: neg on L Varus test: pos on L with patient compalining of pain on Lateral aspect of knee McMurray's test: Pos with varus and internal rotation of tibia; neg with valgus and external rotation of tibia Thessley's test: pt unable to perform due to coordination issues on L LE, N/A Anterior drawer test and  posterior drawer test: negative in L LE Assessed back of the knee: there is little bit of non pitting edema but unsure of it is Baker's cyst or just normal synovial  fluid (when compared to other side)   Bridge: hooklying: 10x Bridge with ball under calves: 10x, challenging for patient even with both UE support Supine hamstring curls (without bridge): 10x Alternating q-ped UE and LE: poor coordination/balance, deferred, pt educated to work on UE and LE seperately and gradually incormporate UE and LE together as she is able to . Q-ped LE: 10x alternating R and L Q-ped UE: 10x alternating R and L Attempted bridge with alternating leg extension but clonus started in L LE so deferred after 2 reps Attempted single leg press: 40lbs: mm spasms present in L LE and pt has difficult time eccentrically controlling L knee flexion, even with PT assisting in the back of the knee. Trialed: bil leg press: 40lbs, during eccentric control: pt asked to let L LE control 90% of eccentric control- no spasms noted with this. Went back to single leg press: 40lbs 5x, Pt asked to imagine, her leg is not weak, pt able to perform better with some mental visualization with  significantly decreased spasms. Single leg hamstring scoots on stool: 2 x 20 feet L only SLS on R LE: 30 sec SLS on L LE, pt tends to hyperextend the knee, when asked to keep knee slightly flexed, patient is not able to control the knee. Pt asked to perform TTWB on R LE and both hands lightly on rail and keep L knee slightly flexed as she holds this posture for 30 sec- reviewed for HEP    PATIENT EDUCATION: Education details: initial HEP, avoid full range extension with exercises to avoid setting off clonus Person educated: Patient Education method: Explanation, Demonstration, and Handouts Education comprehension: verbalized understanding, returned demonstration, and needs further education  HOME EXERCISE PROGRAM: Access Code: [email or web address] URL: [email or web address] Date: 10/19/2024 Prepared by: Raj Blanch  Exercises - Quadruped Alternating Leg Extensions  - 1 x daily - 7 x weekly - 2 sets - 10 reps - Quadruped Alternating Shoulder Flexion  - 1 x daily - 7 x weekly - 2 sets - 10 reps - Bird Dog  - 1 x daily - 7 x weekly - 2 sets - 10 reps - Bridge with Heels on [employer]  - 1 x daily - 7 x weekly - 2 sets - 10 reps - Single Leg Stance  - 1 x daily - 7 x weekly - 3 reps - 30 sec hold - Wall Quarter Squat  - 1 x daily - 7 x weekly - 2 sets - 10 reps  GOALS: Goals reviewed with patient? Yes  SHORT TERM GOALS: Target date: 10/19/2024   Pt will be independent with initial HEP for improved strength, balance, transfers and gait as well as management of pain symptoms. Baseline: Goal status: INITIAL   LONG TERM GOALS: Target date: 11/09/2024   Pt will be independent with final HEP for improved strength, balance, transfers and gait as well as management of pain symptoms. Baseline:  Goal status: INITIAL  2.  Pt will improve FGA to 20/30 for decreased fall risk  Baseline: 16/30 (12/12) Goal status: INITIAL  3.  Pt will improve her L knee extension ROM to 0 degrees with  increase in pain </= 2/10 from baseline Baseline: 40 degrees with 8/10 pain (12/12) Goal status: INITIAL    ASSESSMENT:  CLINICAL IMPRESSION: Today's session emphasized on assessing L knee ligament laxity and working on strength, coordination exercises. Patient may have LCL sprain that is currently healing very well. Posterior knee pain may be due to more hamstring tendinosis (  vs Baker's cyst pain) considering neurological muscle spasms in L LE and prolonged pain in L knee. Patient has poor control with knee flexion/extension with single limb exercises. We worked on some biofeedback during exercises which seemed to help patient work through some of the spasms and improve motor control.     OBJECTIVE IMPAIRMENTS: Abnormal gait, decreased activity tolerance, decreased balance, decreased coordination, difficulty walking, decreased ROM, decreased strength, increased edema, impaired perceived functional ability, increased muscle spasms, impaired tone, improper body mechanics, postural dysfunction, and pain.   ACTIVITY LIMITATIONS: bending, sitting, standing, squatting, stairs, and transfers  PARTICIPATION LIMITATIONS: community activity  PERSONAL FACTORS: 1-2 comorbidities:   familiar hemiplegic migraine, angioedema 2017,dysautonomia; infarct right internal capsule/ R basal ganglia strokeare also affecting patient's functional outcome.   REHAB POTENTIAL: Excellent  CLINICAL DECISION MAKING: Evolving/moderate complexity  EVALUATION COMPLEXITY: Moderate  PLAN:  PT FREQUENCY: 1x/week  PT DURATION: 6 weeks  PLANNED INTERVENTIONS: [id number]- PT Re-evaluation, 97750- Physical Performance Testing, 97110-Therapeutic exercises, 97530- Therapeutic activity, [id number]- Neuromuscular re-education, 97535- Self Care, [id number]- Manual therapy, [id number]- Gait training, [phone]- Aquatic Therapy, [phone]- Electrical stimulation (manual), Patient/Family education, Balance training, Stair training, Taping, Joint mobilization,  DME instructions, Cryotherapy, and Moist heat  PLAN FOR NEXT SESSION: how is L knee AROM? How is initial HEP? Add to HEP to work on L quad and HS strengthening (leg press, SLS, stance on compliant surface, bridge with HS curl, seated resisted HS curls, standing resisted HS curls, prone contract/relax of HS), estim? Does she need a L knee MRI?   Raj LOISE Blanch, PT  10/19/2024, 10:25 AM        "

## 2024-10-23 ENCOUNTER — Telehealth: Payer: Self-pay | Admitting: Family Medicine

## 2024-10-23 ENCOUNTER — Other Ambulatory Visit: Payer: Self-pay

## 2024-10-23 MED ORDER — AMOXICILLIN 500 MG PO CAPS
500.0000 mg | ORAL_CAPSULE | Freq: Two times a day (BID) | ORAL | 0 refills | Status: AC
  Filled 2024-10-23: qty 20, 10d supply, fill #0

## 2024-10-23 MED ORDER — DEXAMETHASONE 4 MG PO TABS
10.0000 mg | ORAL_TABLET | Freq: Once | ORAL | 0 refills | Status: AC
  Filled 2024-10-23: qty 3, 1d supply, fill #0

## 2024-10-23 NOTE — Telephone Encounter (Signed)
 The patient contacted the office to cancel her scheduled appointment, reporting that her symptoms have improved and she no longer requires the visit.

## 2024-10-24 ENCOUNTER — Other Ambulatory Visit: Payer: Self-pay

## 2024-10-24 ENCOUNTER — Ambulatory Visit: Payer: Self-pay | Admitting: Primary Care

## 2024-10-24 ENCOUNTER — Ambulatory Visit: Admitting: Family Medicine

## 2024-10-24 DIAGNOSIS — J020 Streptococcal pharyngitis: Secondary | ICD-10-CM

## 2024-10-24 MED ORDER — BENZONATATE 100 MG PO CAPS
200.0000 mg | ORAL_CAPSULE | Freq: Three times a day (TID) | ORAL | 0 refills | Status: AC | PRN
  Filled 2024-10-24: qty 30, 5d supply, fill #0

## 2024-10-24 NOTE — Telephone Encounter (Signed)
 Patient calling back in to schedule tomorrow at 11 am, as previous NT offered that appointment. Advised patient that appointment is no longer available, soonest in any PCP office is Friday.

## 2024-10-24 NOTE — Telephone Encounter (Signed)
 FYI Only or Action Required?: Action required by provider: requesting cough medicine.  Patient was last seen in primary care on 06/04/2024 by Gretta Comer POUR, NP.  Called Nurse Triage reporting Sore Throat.  Symptoms began several weeks ago.  Interventions attempted: OTC medications: cough medicine and Prescription medications: on second atbx, decadron .  Symptoms are: unchanged.  Triage Disposition: See Physician Within 24 Hours  Patient/caregiver understands and will follow disposition?: Yes  Copied from CRM #[id number]. Topic: Clinical - Red Word Triage >> Oct 24, 2024 11:14 AM Eva FALCON wrote: Red Word that prompted transfer to Nurse Triage: severe cough,swollen tonsils/throat., was given antibiotic but not getting any better. Has appointment Friday, but was hoping to get appointment moved sooner. Reason for Disposition  SEVERE throat pain (e.g., excruciating)  Answer Assessment - Initial Assessment Questions Pt has an appt scheduled for Friday but was hoping to be seen today or tomorrow after 3pm. She states that she was recently treated with a single dose of decadron  and atbx. She states she is still having a lot of drainage down into her throat. She thinks it might be something other than strep since she has the nasal congestion. She states the swelling in her throat is better since doing the steroid. She states she has a doctor she works with look at is and they said swelling is better. She states it's worse on the left side. Denies any higher acuity symptoms. No appts available at the time she is requesting. RN did advise could go to urgent care. She would like to keep her appt on Friday. She is requesting a cough medicine be called in as the OTC is not helping much and she's not able to sleep at night due to the cough.     1. ONSET: When did the throat start hurting? (Hours or days ago)      A couple of weeks ago 2. SEVERITY: How bad is the sore throat? (Scale 1-10; mild,  moderate or severe)     Moderate  3. STREP EXPOSURE: Has there been any exposure to strep within the past week? If Yes, ask: What type of contact occurred?      Over a week ago, was treated for strep, on second round of antibiotic 4.  VIRAL SYMPTOMS: Are there any symptoms of a cold, such as a runny nose, cough, hoarse voice or red eyes?      Yes runny nose, nasal drainage into there throat 5. FEVER: Do you have a fever? If Yes, ask: What is your temperature, how was it measured, and when did it start?     denies 6. PUS ON THE TONSILS: Is there pus on the tonsils in the back of your throat?     States there is something on the left side 7. OTHER SYMPTOMS: Do you have any other symptoms? (e.g., difficulty breathing, headache, rash)     denies  Protocols used: Sore Throat-A-AH

## 2024-10-24 NOTE — Telephone Encounter (Signed)
 Noted

## 2024-10-25 ENCOUNTER — Ambulatory Visit (INDEPENDENT_AMBULATORY_CARE_PROVIDER_SITE_OTHER): Admitting: Psychology

## 2024-10-25 DIAGNOSIS — F429 Obsessive-compulsive disorder, unspecified: Secondary | ICD-10-CM | POA: Diagnosis not present

## 2024-10-25 DIAGNOSIS — F411 Generalized anxiety disorder: Secondary | ICD-10-CM

## 2024-10-25 DIAGNOSIS — F330 Major depressive disorder, recurrent, mild: Secondary | ICD-10-CM | POA: Diagnosis not present

## 2024-10-25 NOTE — Progress Notes (Signed)
 " [facility] Counselor/Therapist Progress Note  Patient ID: Faith Jordan, MRN: [id number],    Date:  10/25/2024  Time Spent: 54 minutes  Time in:5:03 Time out: 5:57  Treatment Type:- The patient was seen via video visit.  She gave verbal consent for the session to be on video on caregility.  The patient was in her home alone and therapist was in the office.  Reported Symptoms: anxiety, panic attacks, sadness  Mental Status Exam: Appearance:  Casual     Behavior: Appropriate  Motor: Normal  Speech/Language:  Normal Rate  Affect: blunted  Mood: pleasant  Thought process: normal  Thought content:   WNL  Sensory/Perceptual disturbances:   WNL  Orientation: oriented to person, place, time/date, and situation  Attention: Good  Concentration: Good  Memory: WNL  Fund of knowledge:  Good  Insight:   Fair  Judgment:  Good  Impulse Control: Good   Risk Assessment: Danger to Self:  No Self-injurious Behavior: No Danger to Others: No Duty to Warn:no Physical Aggression / Violence:No  Access to Firearms a concern: No  Gang Involvement:No   Subjective: The patient was seen for an individual therapy session  via video visit today.  The patient gave verbal permission for the session to be on video on caregility and she is aware of the limitations of telehealth.  The patient was in her home alone and therapist was in the office.  The patient presents with a blunted affect and her mood is pleasant.  The patient reports that she has a date this weekend and she met him on a dating app.  She started doing a lot of questioning and looking for things as to why the relationship with him would not work.  I did a lot of reframing with her today and also talked with her about cognitive distortions and specifically absolutes.  The patient is afraid to be in a relationship and is afraid she is going to choose the wrong situation and I explained to her that she will be fine and that  even if she made a bad choice if she would learn from it that was the thing that she needed to work toward doing.  We also talked about her potentially going to see another therapist and I am requesting that a release of information be sent to her  and then the patient can decide whether she wants to go see the therapist in [place].  Interventions: Cognitive Behavioral Therapy and Assertiveness/Communication,, problem solving, psychoeducation,  EMDR as indicated, Meditation and mindfulness,   Diagnosis:GAD (generalized anxiety disorder)  Obsessive-compulsive disorder, unspecified type  Major depressive disorder, recurrent episode, mild  Plan: Client Abilities/Strengths  Intelligent, insightful, motivated  Client Treatment Preferences  Outpatient Individual therapy every other week  Client Statement of Needs   I feel better now, but still need some help with anxiety  Treatment Level  Outpatient Individual therapy  Symptoms  Frustration and anxiety related to providing oversight and caretaking to an aging, ailing, and dependent  parent.: No Description Entered (Status: improved). Hypervigilance (e.g., feeling constantly on edge,  experiencing concentration difficulties, having trouble falling or staying asleep, exhibiting a general  state of irritability).: No Description Entered (Status: improved). Motor tension (e.g., restlessness,  tiredness, shakiness, muscle tension).: No Description Entered (Status: improved).  Problems Addressed  Anxiety, Phase Of Life Problems, Anxiety  Goals 1. Learn and implement coping skills that result in a reduction of anxiety  and worry, and improved daily functioning. Objective Learn  and implement calming skills to reduce overall anxiety and manage anxiety symptoms. Target Date: 2026-08-09Frequency: Weekly Progress: 60 Modality: individual  Related Interventions 1. Teach the client calming/relaxation skills (e.g., applied relaxation, progressive  muscle  relaxation, cue controlled relaxation; mindful breathing; biofeedback) and how to discriminate  better between relaxation and tension; teach the client how to apply these skills to his/her daily  life (e.g., New Directions in Progressive Muscle Relaxation by Thornell Collier, and  Hazlett-Stevens; Treating Generalized Anxiety Disorder by Rygh and Red). Objective Identify, challenge, and replace biased, fearful self-talk with positive, realistic, and empowering selftalk. Target Date: 2025-05-26 Frequency: weekly Progress: 60 Modality: individual Related Interventions 1. Explore the client's schema and self-talk that mediate his/her fear response; assist him/her in  challenging the biases; replace the distorted messages with reality-based alternatives and  positive, realistic self-talk that will increase his/her self-confidence in coping with irrational  fears (see Cognitive Therapy of Anxiety Disorders by Gretta armin Mon). Objective Learn and implement problem-solving strategies for realistically addressing worries. Target Date: 2026-08-09Frequency: weekly Progress: 60 Modality: individual 2. Resolve conflicted feelings and adapt to the new life circumstances. Objective Apply problem-solving skills to current circumstances. Target Date: 2025-05-26 Frequency: weekly Progress: 40 Modality: individual Related Interventions 1. Teach the client problem-resolution skills (e.g., defining the problem clearly, brainstorming  multiple solutions, listing the pros and cons of each solution, seeking input from others,  selecting and implementing a plan of action, evaluating outcome, and readjusting plan as  necessary).   3. Stabilize anxiety level while increasing ability to function on a daily  basis. Diagnosis F33.1  Major depressive disorder, moderate 300.02 (Generalized anxiety disorder) - Open - [Signifier: n/a]  Axis  none 309.28 (Adjustment disorder with mixed anxiety  and depressed  mood) - Open - [Signifier: n/a]  Adjustment Disorder,  With Anxiety   Marital conflict  Major Depressive disorder, moderate  Conditions For Discharge Achievement of treatment goals and objectives.  The patient approved this plan.   Sahas Sluka G Deniese Oberry, LCSW                                                                                                     "

## 2024-10-26 ENCOUNTER — Other Ambulatory Visit: Payer: Self-pay

## 2024-10-26 ENCOUNTER — Ambulatory Visit (INDEPENDENT_AMBULATORY_CARE_PROVIDER_SITE_OTHER)
Admission: RE | Admit: 2024-10-26 | Discharge: 2024-10-26 | Disposition: A | Source: Ambulatory Visit | Attending: Primary Care

## 2024-10-26 ENCOUNTER — Ambulatory Visit: Admitting: Physical Therapy

## 2024-10-26 ENCOUNTER — Ambulatory Visit: Admitting: Primary Care

## 2024-10-26 ENCOUNTER — Encounter: Payer: Self-pay | Admitting: Primary Care

## 2024-10-26 ENCOUNTER — Ambulatory Visit: Payer: Self-pay | Admitting: Primary Care

## 2024-10-26 VITALS — BP 128/70 | HR 93 | Temp 98.0°F | Ht 67.0 in | Wt 136.1 lb

## 2024-10-26 DIAGNOSIS — R2689 Other abnormalities of gait and mobility: Secondary | ICD-10-CM

## 2024-10-26 DIAGNOSIS — R051 Acute cough: Secondary | ICD-10-CM | POA: Insufficient documentation

## 2024-10-26 DIAGNOSIS — R29898 Other symptoms and signs involving the musculoskeletal system: Secondary | ICD-10-CM

## 2024-10-26 DIAGNOSIS — M6281 Muscle weakness (generalized): Secondary | ICD-10-CM | POA: Diagnosis not present

## 2024-10-26 DIAGNOSIS — M25562 Pain in left knee: Secondary | ICD-10-CM

## 2024-10-26 DIAGNOSIS — R278 Other lack of coordination: Secondary | ICD-10-CM

## 2024-10-26 DIAGNOSIS — R2681 Unsteadiness on feet: Secondary | ICD-10-CM

## 2024-10-26 DIAGNOSIS — R29818 Other symptoms and signs involving the nervous system: Secondary | ICD-10-CM

## 2024-10-26 LAB — POCT INFLUENZA A/B
Influenza A, POC: NEGATIVE
Influenza B, POC: NEGATIVE

## 2024-10-26 LAB — POC COVID19 BINAXNOW: SARS Coronavirus 2 Ag: NEGATIVE

## 2024-10-26 MED ORDER — HYDROCOD POLI-CHLORPHE POLI ER 10-8 MG/5ML PO SUER
5.0000 mL | Freq: Two times a day (BID) | ORAL | 0 refills | Status: AC | PRN
  Filled 2024-10-26: qty 50, 5d supply, fill #0

## 2024-10-26 MED ORDER — IPRATROPIUM-ALBUTEROL 0.5-2.5 (3) MG/3ML IN SOLN
3.0000 mL | Freq: Once | RESPIRATORY_TRACT | Status: AC
  Administered 2024-10-26: 3 mL via RESPIRATORY_TRACT

## 2024-10-26 MED ORDER — PREDNISONE 20 MG PO TABS
ORAL_TABLET | ORAL | 0 refills | Status: AC
  Filled 2024-10-26: qty 10, 5d supply, fill #0

## 2024-10-26 NOTE — Therapy (Signed)
 " OUTPATIENT PHYSICAL THERAPY NEURO TREATMENT - DISCHARGE NOTE   Patient Name: Faith Jordan MRN: [id number] DOB:[birth year], 29 y.o., female Today's Date: 10/26/2024   PCP: Faith Comer POUR, NP REFERRING PROVIDER: Persons, Faith Dragon, PA  PHYSICAL THERAPY DISCHARGE SUMMARY  Visits from Start of Care: 4  Current functional level related to goals / functional outcomes: Mod I   Remaining deficits: Mild discomfort in L knee   Education / Equipment: Handout for final HEP   Patient agrees to discharge. Patient goals were met. Patient is being discharged due to being pleased with the current functional level.   END OF SESSION:  PT End of Session - 10/26/24 1104     Visit Number 4    Number of Visits 7   with eval   Date for Recertification  11/23/24    Authorization Type Faith Jordan [employer]    PT Start Time 1102    PT Stop Time 1118   d/c   PT Time Calculation (min) 16 min    Equipment Utilized During Treatment Gait belt    Activity Tolerance Patient tolerated treatment well    Behavior During Therapy [facility] for tasks assessed/performed             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   Acute flank pain 06/02/2022   Angio-edema 04/27/2016   Angioedema    Aquagenic angio-edema-urticaria    Asthma    no problems /not used recently   Dysautonomia (HCC)    Dysrhythmia    sinus tachycardia   Fibroid    right breast, adenoma   Headache(784.0)    History of COVID-19 11/21/2020   Hives 12/22/2015   Pneumonia    hx  6th grade   Sensation of fullness in both ears 02/02/2023   Snoring 02/11/2022   Urinary frequency 02/12/2019   Vaginal yeast infection 06/02/2022   Vasculitis    Past Surgical History:  Procedure Laterality Date   ADENOIDECTOMY     BREAST LUMPECTOMY Right    BREAST LUMPECTOMY Right    MASS EXCISION Right 10/07/2014   Procedure: EXCISION OF RIGHT BREAST MASS;  Surgeon: Krystal Russell, MD;   Location: [facility] OR;  Service: General;  Laterality: Right;   TONSILLECTOMY AND ADENOIDECTOMY     TOOTH EXTRACTION     TRANSESOPHAGEAL ECHOCARDIOGRAM (CATH LAB) N/A 01/10/2024   Procedure: TRANSESOPHAGEAL ECHOCARDIOGRAM;  Surgeon: Jeffrie Oneil BROCKS, MD;  Location: [facility] INVASIVE CV LAB;  Service: Cardiovascular;  Laterality: N/A;   Patient Active Problem List   Diagnosis Date Noted   Pain in left knee 09/18/2024   Pain in left ankle and joints of left foot 09/18/2024   Chronic nausea 06/04/2024   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) 04/09/2024   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 (CVA) 04/05/2024   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) 02/10/2024   Coping style affecting medical condition 01/19/2024   History of CVA (cerebrovascular accident) 01/12/2024   Stroke of right basal ganglia (HCC) 01/07/2024   Non-restorative sleep 05/26/2022   Sleep paralysis, 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   Vivid dream 05/26/2022   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   Excessive daytime sleepiness 05/26/2022   Sleep disturbance 03/17/2022   Panic attacks 02/11/2022   Vitamin B12 deficiency 07/30/2021   Vitamin D  deficiency 07/30/2021   Family history of hypothyroidism 07/30/2021   GAD (generalized anxiety disorder) 03/18/2020  Fatigue 11/06/2019   Chronic back pain 07/06/2019   Preventative health care 04/27/2018   Migraines 04/25/2018   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   Anemia 01/04/2018   Headache disorder 12/29/2015   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   Sinus tachycardia 12/22/2015   Dysautonomia (HCC) 12/22/2015   Dysautonomia, familial (HCC) 04/22/2015   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   Neurocardiogenic syncope 04/06/2013   Intermittent palpitations 04/06/2013    ONSET DATE: 09/18/2024  REFERRING DIAG: M25.562 (ICD-10-CM) - Acute pain of left  knee  THERAPY DIAG:  Muscle weakness (generalized)  Unsteadiness on feet  Left knee pain, unspecified chronicity  Other abnormalities of gait and mobility  Other lack of coordination  Other symptoms and signs involving the nervous system  Other symptoms and signs involving the musculoskeletal system  Rationale for Evaluation and Treatment: Rehabilitation  SUBJECTIVE:                                                                                                                                                                                             SUBJECTIVE STATEMENT: [facility]  Pt reports that her L knee is doing a lot better, no pain right now. Pt feels like she is walking better. Pt reports that her clonus is better as well. Pt feels like she is at about 90% of where she was before this injury. She reports that she can get in/out of the car easier and is walking a lot better.   Pt accompanied by: self  PERTINENT HISTORY: PMH: familiar hemiplegic migraine, angioedema 2017,dysautonomia; infarct right internal capsule/ R basal ganglia stroke  PAIN:  Are you having pain? Yes: NPRS scale: 2/10 pain Pain location: L knee (back of knee), tenderness along lateral joint line Pain description: sharp Aggravating factors: bending L knee all the way or straightening it all the way, standing/weight bearing Relieving factors: propping it up with pillow, sitting, resting  PRECAUTIONS: Fall  RED FLAGS: None   WEIGHT BEARING RESTRICTIONS: No  FALLS: Has patient fallen in last 6 months? Yes. [place] of [place], more recently down the stairs leading to this injury  LIVING ENVIRONMENT: Lives with: lives alone Lives in: House/apartment Stairs: Yes: Internal: 12 steps; on right going up, has to use arms quite a bit on her stairs - hard to just have weight on LLE Has following equipment at home: None  PLOF: Independent  PATIENT GOALS: to walk better gain  strength  OBJECTIVE:  Note: Objective measures were completed at Evaluation unless otherwise noted.  DIAGNOSTIC FINDINGS:   L knee Xray 09/15/2024 CLINICAL HISTORY: knee pain  sp fall   FINDINGS:   BONES AND JOINTS: No acute fracture. No focal osseous lesion. No joint dislocation. No significant joint effusion. No significant degenerative changes.   SOFT TISSUES: The soft tissues are unremarkable.   IMPRESSION: 1. Negative.  L foot xray 09/15/2024 CLINICAL HISTORY: Foot  pain sp fall   FINDINGS:   BONES AND JOINTS: No acute fracture. No focal osseous lesion. No joint dislocation.   SOFT TISSUES: The soft tissues are unremarkable.   IMPRESSION: 1. No acute fracture or dislocation.  COGNITION: Overall cognitive status: Within functional limits for tasks assessed   SENSATION: Appears to be [facility] No N/T  COORDINATION: Slightly impaired LLE  EDEMA:  Around L knee, down L shin, in L foot  TONE: clonus in LLE  POSTURE: rounded shoulders and forward head  PALPATION: No pain with PROM, pain with AROM into extension More pain with patellar mobility medially and superiorly vs laterally and inferiorly, hurts in back of knee Most tenderness along lateral joint line   LOWER EXTREMITY ROM:     Active  Right Eval Left Eval  Hip flexion WFL   Hip extension    Hip abduction    Hip adduction    Hip internal rotation    Hip external rotation    Knee flexion  WFL  Knee extension  0-40  Ankle dorsiflexion    Ankle plantarflexion    Ankle inversion    Ankle eversion     (Blank rows = not tested)  LOWER EXTREMITY MMT:    MMT Right Eval Left Eval  Hip flexion 5 5  Hip extension    Hip abduction    Hip adduction    Hip internal rotation    Hip external rotation    Knee flexion 5 4  Knee extension 5 4  Ankle dorsiflexion 5 5  Ankle plantarflexion    Ankle inversion    Ankle eversion    (Blank rows = not tested)  BED MOBILITY:  WFL per pt  report  TRANSFERS: Sit to stand: Modified independence  Assistive device utilized: None     Stand to sit: Modified independence  Assistive device utilized: None     Chair to chair: Modified independence  Assistive device utilized: None       RAMP:  Not tested  CURB:  Not tested  STAIRS:  STAIRS:  Level of Assistance: Modified independence  Stair Negotiation Technique: Alternating Pattern  with Single Rail on Right  Number of Stairs: 6   Height of Stairs: 4  Comments: intermittent rail support   GAIT: Findings:  GAIT: Gait pattern: decreased stance time- Left, decreased hip/knee flexion- Left, circumduction- Left, and antalgic Distance walked: various clinic distances Assistive device utilized: None Level of assistance: Modified independence Comments: see above  FUNCTIONAL TESTS:    [facility] PT Assessment - 10/26/24 1112       Functional Gait  Assessment   Gait assessed  Yes    Gait Level Surface Walks 20 ft in less than 5.5 sec, no assistive devices, good speed, no evidence for imbalance, normal gait pattern, deviates no more than 6 in outside of the 12 in walkway width.    Change in Gait Speed Able to smoothly change walking speed without loss of balance or gait deviation. Deviate no more than 6 in outside of the 12 in walkway width.    Gait with Horizontal Head Turns Performs head turns smoothly with no change in gait. Deviates no more than 6 in outside  12 in walkway width    Gait with Vertical Head Turns Performs head turns with no change in gait. Deviates no more than 6 in outside 12 in walkway width.    Gait and Pivot Turn Pivot turns safely within 3 sec and stops quickly with no loss of balance.    Step Over Obstacle Is able to step over 2 stacked shoe boxes taped together (9 in total height) without changing gait speed. No evidence of imbalance.    Gait with Narrow Base of Support Is able to ambulate for 10 steps heel to toe with no staggering.    Gait with Eyes  Closed Walks 20 ft, no assistive devices, good speed, no evidence of imbalance, normal gait pattern, deviates no more than 6 in outside 12 in walkway width. Ambulates 20 ft in less than 7 sec.    Ambulating Backwards Walks 20 ft, no assistive devices, good speed, no evidence for imbalance, normal gait    Steps Alternating feet, no rail.    Total Score 30    FGA comment: 30/30, low fall risk             LOWER EXTREMITY SPECIAL TESTS:  Knee special tests: Anterior drawer test: positive , Posterior drawer test: negative, Apley's compression test: negative, McMurray's test: negative, and Patellafemoral apprehension test: negative                                                                                                                               TREATMENT DATE:   [employer] For LTG assessment: Reassessed L knee ROM in supine: Extension: 0 degrees Flexion: 142 degrees   [facility] PT Assessment - 10/26/24 1112       Functional Gait  Assessment   Gait assessed  Yes    Gait Level Surface Walks 20 ft in less than 5.5 sec, no assistive devices, good speed, no evidence for imbalance, normal gait pattern, deviates no more than 6 in outside of the 12 in walkway width.    Change in Gait Speed Able to smoothly change walking speed without loss of balance or gait deviation. Deviate no more than 6 in outside of the 12 in walkway width.    Gait with Horizontal Head Turns Performs head turns smoothly with no change in gait. Deviates no more than 6 in outside 12 in walkway width    Gait with Vertical Head Turns Performs head turns with no change in gait. Deviates no more than 6 in outside 12 in walkway width.    Gait and Pivot Turn Pivot turns safely within 3 sec and stops quickly with no loss of balance.    Step Over Obstacle Is able to step over 2 stacked shoe boxes taped together (9 in total height) without changing gait speed. No evidence of imbalance.    Gait with Narrow Base of Support Is able  to ambulate for 10 steps heel to toe with no staggering.  Gait with Eyes Closed Walks 20 ft, no assistive devices, good speed, no evidence of imbalance, normal gait pattern, deviates no more than 6 in outside 12 in walkway width. Ambulates 20 ft in less than 7 sec.    Ambulating Backwards Walks 20 ft, no assistive devices, good speed, no evidence for imbalance, normal gait    Steps Alternating feet, no rail.    Total Score 30    FGA comment: 30/30, low fall risk           PATIENT EDUCATION: Education details: continue HEP, results of reassessments, plan to d/c this visit Person educated: Patient Education method: Explanation Education comprehension: verbalized understanding  HOME EXERCISE PROGRAM: Access Code: [email or web address] URL: [email or web address] Date: 10/19/2024 Prepared by: Raj Blanch  Exercises - Quadruped Alternating Leg Extensions  - 1 x daily - 7 x weekly - 2 sets - 10 reps - Quadruped Alternating Shoulder Flexion  - 1 x daily - 7 x weekly - 2 sets - 10 reps - Bird Dog  - 1 x daily - 7 x weekly - 2 sets - 10 reps - Bridge with Heels on [employer]  - 1 x daily - 7 x weekly - 2 sets - 10 reps - Single Leg Stance  - 1 x daily - 7 x weekly - 3 reps - 30 sec hold - Wall Quarter Squat  - 1 x daily - 7 x weekly - 2 sets - 10 reps  GOALS: Goals reviewed with patient? Yes  SHORT TERM GOALS: Target date: 10/19/2024   Pt will be independent with initial HEP for improved strength, balance, transfers and gait as well as management of pain symptoms. Baseline: Goal status: MET   LONG TERM GOALS: Target date: 11/09/2024   Pt will be independent with final HEP for improved strength, balance, transfers and gait as well as management of pain symptoms. Baseline:  Goal status: MET  2.  Pt will improve FGA to 20/30 for decreased fall risk  Baseline: 16/30 (12/12),  30/30 (1/9) Goal status: MET  3.  Pt will improve her L knee extension ROM to 0 degrees with  increase in pain </= 2/10 from baseline Baseline: 40 degrees with 8/10 pain (12/12), 0 degrees with 0/10 pain (1/9) Goal status: MET    ASSESSMENT:  CLINICAL IMPRESSION: Emphasis of skilled PT session on assessing LTG due to patient reports of feeling significantly improved. She has met 3/3 LTG due to being independent with her final HEP, improving her FGA score from 16/30 to 30/30, and improving her L knee ROM from only 40 degrees extension to full 0 degrees extension without any increase in pain and without setting off her clonus. Encouraged her to continue with her HEP and obtain a new referral to return to PT in the future if needed. Pt agreeable to d/c from OPPT services this date due to reduced pain and improved function.    OBJECTIVE IMPAIRMENTS: Abnormal gait, decreased activity tolerance, decreased balance, decreased coordination, difficulty walking, decreased ROM, decreased strength, increased edema, impaired perceived functional ability, increased muscle spasms, impaired tone, improper body mechanics, postural dysfunction, and pain.   ACTIVITY LIMITATIONS: bending, sitting, standing, squatting, stairs, and transfers  PARTICIPATION LIMITATIONS: community activity  PERSONAL FACTORS: 1-2 comorbidities:   familiar hemiplegic migraine, angioedema 2017,dysautonomia; infarct right internal capsule/ R basal ganglia strokeare also affecting patient's functional outcome.   REHAB POTENTIAL: Excellent  CLINICAL DECISION MAKING: Evolving/moderate complexity  EVALUATION COMPLEXITY: Moderate  PLAN: discharge from PT   [facility]  Nasrin Lanzo, PT [place], PT, DPT, CSRS   10/26/2024, 11:19 AM        "

## 2024-10-26 NOTE — Progress Notes (Signed)
 "  Subjective:    Patient ID: Faith Jordan, female    DOB: 02/23/1996, 29 y.o.   MRN: [id number]  Faith Jordan is a very pleasant 29 y.o. female with a history of CVA, AMS, migraines who presents today to discuss acute cough.  Evaluated for ED visit on 10/07/2024 for a 1 day history of voice hoarseness, neck stiffness, sore throat, enlarged cervical lymph nodes.  She is diagnosed with strep pharyngitis and was prescribed amoxicillin  500 mg twice daily x 10 days.  Symptoms have yet to improve so she was prescribed another course of amoxicillin  500 mg twice daily x 10 days and one dose of Decadron . She never underwent actual testing for strep pharyngitis  She reached out to us  via MyChart a few days ago with reports of unmanageable cough despite OTC treatment.  She was prescribed benzonatate  capsules to take 3 times daily as needed.  Today she discusses ongoing cough, difficulty taking in a deep breath, and continued sore throat. Over the last 24 hours she's developed chest tightness and ongoing cough. She denies fevers. Her throat pain has resolved.   Review of Systems  Constitutional:  Negative for fever.  HENT:  Negative for sore throat.   Respiratory:  Positive for cough, chest tightness and shortness of breath.          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   Acute flank pain 06/02/2022   Angio-edema 04/27/2016   Angioedema    Aquagenic angio-edema-urticaria    Asthma    no problems /not used recently   Dysautonomia [facility])    Dysrhythmia    sinus tachycardia   Fibroid    right breast, adenoma   Headache(784.0)    History of COVID-19 11/21/2020   Hives 12/22/2015   Pneumonia    hx  6th grade   Sensation of fullness in both ears 02/02/2023   Snoring 02/11/2022   Urinary frequency 02/12/2019   Vaginal yeast infection 06/02/2022   Vasculitis     Social History   Socioeconomic History   Marital status:  Significant Other    Spouse name: Not on file   Number of children: Not on file   Years of education: Not on file   Highest education level: Bachelor's degree (e.g., BA, AB, BS)  Occupational History   Not on file  Tobacco Use   Smoking status: Never   Smokeless tobacco: Never  Vaping Use   Vaping status: Never Used  Substance and Sexual Activity   Alcohol use: No   Drug use: No   Sexual activity: Yes  Other Topics Concern   Not on file  Social History Narrative   Lives with fiance   R handed   Caffeine : 1 drink a day   Social Drivers of Health   Tobacco Use: Low Risk (10/26/2024)   Patient History    Smoking Tobacco Use: Never    Smokeless Tobacco Use: Never    Passive Exposure: Not on file  Financial Resource Strain: Not on file  Food Insecurity: No Food Insecurity (01/09/2024)   Hunger Vital Sign    Worried About Running Out of Food in the Last Year: Never true    Ran Out of Food in the Last Year: Never true  Transportation Needs: No Transportation Needs (01/09/2024)   PRAPARE - Administrator, Civil Service (Medical): No    Lack of Transportation (Non-Medical): No  Physical Activity: Not  on file  Stress: Not on file  Social Connections: Not on file  Intimate Partner Violence: Not At Risk (01/09/2024)   Humiliation, Afraid, Rape, and Kick questionnaire    Fear of Current or Ex-Partner: No    Emotionally Abused: No    Physically Abused: No    Sexually Abused: No  Depression (PHQ2-9): Low Risk (10/02/2024)   Depression (PHQ2-9)    PHQ-2 Score: 0  Alcohol Screen: Not on file  Housing: Unknown (04/11/2024)   Received from [facility]   Epic    Unable to Pay for Housing in the Last Year: Not on file    Number of Times Moved in the Last Year: Not on file    At any time in the past 12 months, were you homeless or living in a shelter (including now)?: No  Utilities: Not At Risk (01/09/2024)   [facility] Utilities    Threatened with loss of  utilities: No  Health Literacy: Not on file    Past Surgical History:  Procedure Laterality Date   ADENOIDECTOMY     BREAST LUMPECTOMY Right    BREAST LUMPECTOMY Right    MASS EXCISION Right 10/07/2014   Procedure: EXCISION OF RIGHT BREAST MASS;  Surgeon: Krystal Russell, MD;  Location: [facility] OR;  Service: General;  Laterality: Right;   TONSILLECTOMY AND ADENOIDECTOMY     TOOTH EXTRACTION     TRANSESOPHAGEAL ECHOCARDIOGRAM (CATH LAB) N/A 01/10/2024   Procedure: TRANSESOPHAGEAL ECHOCARDIOGRAM;  Surgeon: Jeffrie Oneil BROCKS, MD;  Location: [facility] INVASIVE CV LAB;  Service: Cardiovascular;  Laterality: N/A;    Family History  Problem Relation Age of Onset   Depression Mother    Migraines Mother    Narcolepsy Mother    Hypothyroidism Mother    Breast cancer Maternal Grandmother 84   Myasthenia gravis Maternal Grandfather    Asthma Other    Cancer Other    Epilepsy Other    Allergic rhinitis Neg Hx    Angioedema Neg Hx    Atopy Neg Hx    Eczema Neg Hx    Immunodeficiency Neg Hx    Urticaria Neg Hx    Stroke Neg Hx     Allergies[1]  Medications Ordered Prior to Encounter[2]  BP 128/70   Pulse 93   Temp 98 F (36.7 C) (Oral)   Ht 5' 7 (1.702 m)   Wt 136 lb 2 oz (61.7 kg)   LMP 10/01/2024   SpO2 97%   BMI 21.32 kg/m  Objective:   Physical Exam Constitutional:      Appearance: She is ill-appearing.  HENT:     Right Ear: Tympanic membrane and ear canal normal.     Left Ear: Tympanic membrane and ear canal normal.     Nose: No mucosal edema.     Right Sinus: No maxillary sinus tenderness or frontal sinus tenderness.     Left Sinus: No maxillary sinus tenderness or frontal sinus tenderness.     Mouth/Throat:     Mouth: Mucous membranes are moist.  Eyes:     Conjunctiva/sclera: Conjunctivae normal.  Cardiovascular:     Rate and Rhythm: Normal rate and regular rhythm.  Pulmonary:     Effort: Pulmonary effort is normal.     Breath sounds: Normal breath sounds. No wheezing or  rhonchi.     Comments: Dry cough noted several times during visit Musculoskeletal:     Cervical back: Neck supple.  Skin:    General: Skin is warm and dry.  Physical Exam        Assessment & Plan:  Acute cough Assessment & Plan: Unclear etiology.  Could be postinfectious cough.  Rapid influenza and COVID-19 test negative today. Oral exam does does not an abscess or evidence of pharyngitis. Respiratory exam overall reassuring.  Given her ongoing cough we will provide her with a DuoNeb treatment in the office today.  She agrees. Start prednisone  20 mg tablets. Take 2 tablets by mouth once daily in the morning for 5 days. Continue Tessalon  Perles PRN.  Start Tussionex cough suppressant. Take 5 ml every 12 hours as needed for cough and rest.  Drowsiness precautions provided  Given her history of pneumonia we will obtain a chest x-ray.  Orders: -     POC COVID-19 BinaxNow -     POCT Influenza A/B -     DG Chest 2 View -     predniSONE ; Take 2 tablets by mouth once daily in the morning for 5 days.  Dispense: 10 tablet; Refill: 0 -     Ipratropium-Albuterol  -     Hydrocod Poli-Chlorphe Poli ER; Take 5 mLs by mouth every 12 (twelve) hours as needed for cough.  Dispense: 50 mL; Refill: 0    Assessment and Plan Assessment & Plan         Comer MARLA Gaskins, NP       [1]  Allergies Allergen Reactions   Advil [Ibuprofen] Dermatitis    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, blistering peeling skin   Nsaids Dermatitis    Reaction similar to Stephens-Johnson, blistering peeling rash  Blisters on arms   Sulfa Antibiotics Swelling and Dermatitis    Facial swelling  Facial rash at [age] year old  [2]  Current Outpatient Medications on File Prior to Visit  Medication Sig Dispense Refill   acetaminophen  (TYLENOL ) 325 MG tablet Take 2 tablets (650 mg total) by mouth every 6 (six) hours as needed for mild pain (pain score 1-3), fever or headache.     amoxicillin   (AMOXIL ) 500 MG capsule Take 1 capsule (500 mg total) by mouth every 12 hours daily. 20 capsule 0   atorvastatin  (LIPITOR ) 10 MG tablet Take 0.5 tablets (5 mg total) by mouth at bedtime. 45 tablet 3   baclofen  (LIORESAL ) 10 MG tablet Take 1 tablet (10 mg total) by mouth 3 (three) times daily. (Patient taking differently: Take 10 mg by mouth in the morning and at bedtime. Takes 5 mg in am and afternoon and 10 mg at bedtime) 270 each 3   benzonatate  (TESSALON ) 100 MG capsule Take 2 capsules (200 mg total) by mouth 3 (three) times daily as needed for cough. 30 capsule 0   botulinum toxin Type A  (BOTOX ) 200 units injection Provider to inject 155 units into the muscles of the head and neck every 12 weeks. Discard remainder. 1 each 3   busPIRone  (BUSPAR ) 10 MG tablet Take 0.5 tablets (5 mg total) by mouth 3 (three) times daily for 7 days, THEN 1 tablet (10 mg total) 3 (three) times daily 90 tablet 2   Cholecalciferol (VITAMIN D3) 50 MCG (2000 UT) capsule Take 2,000 Units by mouth in the morning.     clopidogrel  (PLAVIX ) 75 MG tablet Take 1 tablet (75 mg total) by mouth daily. 90 tablet 1   Coenzyme Q10 (COQ-10 PO) Take 1 tablet by mouth in the morning.     FLUoxetine  (PROZAC ) 40 MG capsule Take 1 capsule (40 mg total) by mouth at bedtime. 90 capsule 1  Galcanezumab -gnlm (EMGALITY ) 120 MG/ML SOAJ Inject 1 Dose into the skin every 30 (thirty) days.     LORazepam  (ATIVAN ) 1 MG tablet Take 1 tablet (1 mg total) by mouth 2 (two) times daily. 60 tablet 2   ondansetron  (ZOFRAN -ODT) 4 MG disintegrating tablet Take 1 tablet (4 mg total) by mouth every 8 (eight) hours as needed for nausea or vomiting. 20 tablet 0   pantoprazole  (PROTONIX ) 40 MG tablet Take 1 tablet (40 mg total) by mouth daily. 90 tablet 3   promethazine  (PHENERGAN ) 12.5 MG tablet Take 1 tablet (12.5 mg total) by mouth every 6 (six) hours as needed for nausea or vomiting. 30 tablet 2   Rimegepant Sulfate  (NURTEC) 75 MG TBDP Take 1 tablet by mouth as  needed.     topiramate  (TOPAMAX ) 50 MG tablet Take 1 tablet (50 mg total) by mouth daily. May also take 1 tablet (50 mg total) daily as needed (daily during menstrual cycle). 180 tablet 3   traZODone  (DESYREL ) 100 MG tablet Take 1 tablet (100 mg total) by mouth at bedtime. 90 tablet 0   Ubrogepant  (UBRELVY ) 100 MG TABS Take 1 tablet onset migraine may repeat in 2 hours if needed. (Max 2 tabs/ 24 hrs) 16 tablet 11   vitamin B-12 (CYANOCOBALAMIN ) 500 MCG tablet Take 500 mcg by mouth daily.     No current facility-administered medications on file prior to visit.   "

## 2024-10-26 NOTE — Assessment & Plan Note (Addendum)
 Unclear etiology.  Could be postinfectious cough.  Rapid influenza and COVID-19 test negative today. Oral exam does does not an abscess or evidence of pharyngitis. Respiratory exam overall reassuring.  Given her ongoing cough we will provide her with a DuoNeb treatment in the office today.  She agrees. Start prednisone  20 mg tablets. Take 2 tablets by mouth once daily in the morning for 5 days. Continue Tessalon  Perles PRN.  Start Tussionex cough suppressant. Take 5 ml every 12 hours as needed for cough and rest.  Drowsiness precautions provided  Given her history of pneumonia we will obtain a chest x-ray.

## 2024-10-26 NOTE — Patient Instructions (Addendum)
 Complete xray(s) prior to leaving today. I will notify you of your results once received.  Start prednisone  20 mg tablets. Take 2 tablets by mouth once daily in the morning for 5 days.  Start Tussionex cough suppressant. Take 5 ml every 12 hours as needed for cough and rest. Caution this medication contains codeine which may cause drowsiness.   It was a pleasure to see you today!

## 2024-11-02 ENCOUNTER — Ambulatory Visit: Admitting: Physical Therapy

## 2024-11-04 ENCOUNTER — Other Ambulatory Visit: Payer: Self-pay

## 2024-11-06 ENCOUNTER — Telehealth: Admitting: Adult Health

## 2024-11-07 ENCOUNTER — Ambulatory Visit: Admitting: Psychology

## 2024-11-07 ENCOUNTER — Ambulatory Visit: Admitting: Physical Therapy

## 2024-11-07 DIAGNOSIS — F429 Obsessive-compulsive disorder, unspecified: Secondary | ICD-10-CM | POA: Diagnosis not present

## 2024-11-07 DIAGNOSIS — F411 Generalized anxiety disorder: Secondary | ICD-10-CM

## 2024-11-07 NOTE — Progress Notes (Signed)
 " [facility] Counselor/Therapist Progress Note  Patient ID: Faith Jordan, MRN: [id number],    Date:  11/07/2024  Time Spent: 54 minutes  Time in:10:03 Time out: 10:56  Treatment Type:- The patient was seen via video visit.  She gave verbal consent for the session to be on video on caregility.  The patient was in her home alone and therapist was in the office.  Reported Symptoms: anxiety, panic attacks, sadness  Mental Status Exam: Appearance:  Casual     Behavior: Appropriate  Motor: Normal  Speech/Language:  Normal Rate  Affect: blunted  Mood: pleasant  Thought process: normal  Thought content:   WNL  Sensory/Perceptual disturbances:   WNL  Orientation: oriented to person, place, time/date, and situation  Attention: Good  Concentration: Good  Memory: WNL  Fund of knowledge:  Good  Insight:   Fair  Judgment:  Good  Impulse Control: Good   Risk Assessment: Danger to Self:  No Self-injurious Behavior: No Danger to Others: No Duty to Warn:no Physical Aggression / Violence:No  Access to Firearms a concern: No  Gang Involvement:No   Subjective: The patient was seen for an individual therapy session  via video visit today.  The patient gave verbal permission for the session to be on video on caregility and she is aware of the limitations of telehealth.  The patient was in her home alone and therapist was in the office.  The patient presents with a blunted affect and her mood is pleasant.  We spent a minute on the phone before I got on line.  The patient reports that she has been doing okay but she was concerned about being judged because she ended up sleeping with a guy she had just met and she really did not like him very much.  We talked about this and I explained to her that our job is not to judge and that is long as she learn something from the experience and put it into practice then we were not going to pass that judgment.  I think she just was giving  herself a hard time about it and I explained to her that it was fine and that she did not need to worry about us  being concerned about that.  We talked about her transferring to the new therapist in April and what we will do is have her call the office and get on the person schedule towards mid February.  We talked about things that she needed to do to help herself feel better and talked about her getting herself another book to read as that is one of the things she enjoys doing. Interventions: Cognitive Behavioral Therapy and Assertiveness/Communication,, problem solving, psychoeducation,  EMDR as indicated, Meditation and mindfulness,   Diagnosis:GAD (generalized anxiety disorder)  Obsessive-compulsive disorder, unspecified type  Plan: Client Abilities/Strengths  Intelligent, insightful, motivated  Client Treatment Preferences  Outpatient Individual therapy every other week  Client Statement of Needs   I feel better now, but still need some help with anxiety  Treatment Level  Outpatient Individual therapy  Symptoms  Frustration and anxiety related to providing oversight and caretaking to an aging, ailing, and dependent  parent.: No Description Entered (Status: improved). Hypervigilance (e.g., feeling constantly on edge,  experiencing concentration difficulties, having trouble falling or staying asleep, exhibiting a general  state of irritability).: No Description Entered (Status: improved). Motor tension (e.g., restlessness,  tiredness, shakiness, muscle tension).: No Description Entered (Status: improved).  Problems Addressed  Anxiety, Phase Of  Life Problems, Anxiety  Goals 1. Learn and implement coping skills that result in a reduction of anxiety  and worry, and improved daily functioning. Objective Learn and implement calming skills to reduce overall anxiety and manage anxiety symptoms. Target Date: 2026-08-09Frequency: Weekly Progress: 60 Modality: individual  Related  Interventions 1. Teach the client calming/relaxation skills (e.g., applied relaxation, progressive muscle  relaxation, cue controlled relaxation; mindful breathing; biofeedback) and how to discriminate  better between relaxation and tension; teach the client how to apply these skills to his/her daily  life (e.g., New Directions in Progressive Muscle Relaxation by Thornell Collier, and  Hazlett-Stevens; Treating Generalized Anxiety Disorder by Rygh and Red). Objective Identify, challenge, and replace biased, fearful self-talk with positive, realistic, and empowering selftalk. Target Date: 2025-05-26 Frequency: weekly Progress: 60 Modality: individual Related Interventions 1. Explore the client's schema and self-talk that mediate his/her fear response; assist him/her in  challenging the biases; replace the distorted messages with reality-based alternatives and  positive, realistic self-talk that will increase his/her self-confidence in coping with irrational  fears (see Cognitive Therapy of Anxiety Disorders by Gretta armin Mon). Objective Learn and implement problem-solving strategies for realistically addressing worries. Target Date: 2026-08-09Frequency: weekly Progress: 60 Modality: individual 2. Resolve conflicted feelings and adapt to the new life circumstances. Objective Apply problem-solving skills to current circumstances. Target Date: 2025-05-26 Frequency: weekly Progress: 40 Modality: individual Related Interventions 1. Teach the client problem-resolution skills (e.g., defining the problem clearly, brainstorming  multiple solutions, listing the pros and cons of each solution, seeking input from others,  selecting and implementing a plan of action, evaluating outcome, and readjusting plan as  necessary).   3. Stabilize anxiety level while increasing ability to function on a daily  basis. Diagnosis F33.1  Major depressive disorder, moderate 300.02 (Generalized anxiety  disorder) - Open - [Signifier: n/a]  Axis  none 309.28 (Adjustment disorder with mixed anxiety and depressed  mood) - Open - [Signifier: n/a]  Adjustment Disorder,  With Anxiety   Marital conflict  Major Depressive disorder, moderate  Conditions For Discharge Achievement of treatment goals and objectives.  The patient approved this plan.   Vieno Tarrant G Artemis Loyal, LCSW                                                                                                     "

## 2024-11-09 ENCOUNTER — Encounter: Payer: Self-pay | Admitting: Adult Health

## 2024-11-09 ENCOUNTER — Telehealth: Admitting: Adult Health

## 2024-11-09 ENCOUNTER — Other Ambulatory Visit: Payer: Self-pay

## 2024-11-09 DIAGNOSIS — G4700 Insomnia, unspecified: Secondary | ICD-10-CM

## 2024-11-09 DIAGNOSIS — F410 Panic disorder [episodic paroxysmal anxiety] without agoraphobia: Secondary | ICD-10-CM | POA: Diagnosis not present

## 2024-11-09 DIAGNOSIS — F429 Obsessive-compulsive disorder, unspecified: Secondary | ICD-10-CM

## 2024-11-09 DIAGNOSIS — G479 Sleep disorder, unspecified: Secondary | ICD-10-CM

## 2024-11-09 DIAGNOSIS — F411 Generalized anxiety disorder: Secondary | ICD-10-CM | POA: Diagnosis not present

## 2024-11-09 MED ORDER — BUSPIRONE HCL 10 MG PO TABS
10.0000 mg | ORAL_TABLET | Freq: Three times a day (TID) | ORAL | 1 refills | Status: AC
  Filled 2024-11-09: qty 270, 90d supply, fill #0

## 2024-11-09 MED ORDER — LORAZEPAM 1 MG PO TABS
1.0000 mg | ORAL_TABLET | Freq: Two times a day (BID) | ORAL | 2 refills | Status: AC
  Filled 2024-11-09: qty 60, 30d supply, fill #0

## 2024-11-09 MED ORDER — TRAZODONE HCL 100 MG PO TABS
100.0000 mg | ORAL_TABLET | Freq: Every day | ORAL | 0 refills | Status: AC
  Filled 2024-11-09: qty 90, 90d supply, fill #0

## 2024-11-09 MED ORDER — FLUOXETINE HCL 40 MG PO CAPS
40.0000 mg | ORAL_CAPSULE | Freq: Every day | ORAL | 1 refills | Status: AC
  Filled 2024-11-09: qty 90, 90d supply, fill #0

## 2024-11-09 NOTE — Progress Notes (Signed)
 Faith Jordan Amon [id number] 1996-07-19 29 y.o.  Virtual Visit via Video Note  I connected with pt @ on 11/09/24 at 11:30 AM EST by a video enabled telemedicine application and verified that I am speaking with the correct person using two identifiers.   I discussed the limitations of evaluation and management by telemedicine and the availability of in person appointments. The patient expressed understanding and agreed to proceed.  I discussed the assessment and treatment plan with the patient. The patient was provided an opportunity to ask questions and all were answered. The patient agreed with the plan and demonstrated an understanding of the instructions.   The patient was advised to call back or seek an in-person evaluation if the symptoms worsen or if the condition fails to improve as anticipated.  I provided 25 minutes of non-face-to-face time during this encounter.  The patient was located at home.  The provider was located at [facility].   Angeline LOISE Sayers, NP   Subjective:   Patient ID:  Faith Jordan is a 29 y.o. (DOB 1996/06/20) female.  Chief Complaint: No chief complaint on file.   HPI Jacie Tristan Mckenna presents for follow-up of GAD, OCD, panic disorder, and insomnia.  Referred by therapist - Bambi Cottle.  Describes mood today as ok. Pleasant. Reports tearfulness. Mood symptoms - denies anxiety, depression and irritability. Reports stable interest and motivation. Reports one panic attack since last visit. Reports some worry and over thinking - having another stroke. Denies rumination. Denies obsessive thoughts and acts. Reports mood as better. Stating I feel like I'm doing ok. Feels like current medications are helpful. Taking medications as prescribed. Working with therapist - Chiropodist.  Energy levels improved. Active, working on an exercise routine. Enjoys some usual interests and activities. Divorced. Lives alone. Spending  time with family.  Appetite decreased. Weight loss - 20+ pounds - 136 pounds - 67. Sleeps well most nights. Averages 8 to 10 hours. Reports napping on her off days.  Reports focus and concentration stable. Reports some word finding issues. Completing tasks. Managing aspects of household. 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 Denies SI or HI.  Denies AH or VH. Denies self harm. Denies substance use.  Previous medication trials:  Cymbalta , Gabapentin , Trazadone, Hydroxyzine , Propranolol , Wellbutrin , Lorazepam , Lexapro , Prozac , Zoloft , Prazosin , Effexor    Review of Systems:  Review of Systems  Musculoskeletal:  Negative for gait problem.  Neurological:  Negative for tremors.  Psychiatric/Behavioral:         Please refer to HPI    Medications: I have reviewed the patient's current medications.  Current Outpatient Medications  Medication Sig Dispense Refill   acetaminophen  (TYLENOL ) 325 MG tablet Take 2 tablets (650 mg total) by mouth every 6 (six) hours as needed for mild pain (pain score 1-3), fever or headache.     amoxicillin  (AMOXIL ) 500 MG capsule Take 1 capsule (500 mg total) by mouth every 12 hours daily. 20 capsule 0   atorvastatin  (LIPITOR ) 10 MG tablet Take 0.5 tablets (5 mg total) by mouth at bedtime. 45 tablet 3   baclofen  (LIORESAL ) 10 MG tablet Take 1 tablet (10 mg total) by mouth 3 (three) times daily. (Patient taking differently: Take 10 mg by mouth in the morning and at bedtime. Takes 5 mg in am and afternoon and 10 mg at bedtime) 270 each 3   benzonatate  (TESSALON ) 100 MG capsule Take 2 capsules (200 mg total) by mouth 3 (three) times daily as needed for  cough. 30 capsule 0   botulinum toxin Type A  (BOTOX ) 200 units injection Provider to inject 155 units into the muscles of the head and neck every 12 weeks. Discard remainder. 1 each 3   busPIRone  (BUSPAR ) 10 MG tablet Take 0.5 tablets (5 mg total) by mouth 3 (three) times daily for 7 days, THEN 1 tablet (10 mg  total) 3 (three) times daily 90 tablet 2   chlorpheniramine-HYDROcodone  (TUSSIONEX) 10-8 MG/5ML Take 5 mLs by mouth every 12 (twelve) hours as needed for cough. 50 mL 0   Cholecalciferol (VITAMIN D3) 50 MCG (2000 UT) capsule Take 2,000 Units by mouth in the morning.     clopidogrel  (PLAVIX ) 75 MG tablet Take 1 tablet (75 mg total) by mouth daily. 90 tablet 1   Coenzyme Q10 (COQ-10 PO) Take 1 tablet by mouth in the morning.     FLUoxetine  (PROZAC ) 40 MG capsule Take 1 capsule (40 mg total) by mouth at bedtime. 90 capsule 1   Galcanezumab -gnlm (EMGALITY ) 120 MG/ML SOAJ Inject 1 Dose into the skin every 30 (thirty) days.     LORazepam  (ATIVAN ) 1 MG tablet Take 1 tablet (1 mg total) by mouth 2 (two) times daily. 60 tablet 2   ondansetron  (ZOFRAN -ODT) 4 MG disintegrating tablet Take 1 tablet (4 mg total) by mouth every 8 (eight) hours as needed for nausea or vomiting. 20 tablet 0   pantoprazole  (PROTONIX ) 40 MG tablet Take 1 tablet (40 mg total) by mouth daily. 90 tablet 3   predniSONE  (DELTASONE ) 20 MG tablet Take 2 tablets by mouth once daily in the morning for 5 days. 10 tablet 0   promethazine  (PHENERGAN ) 12.5 MG tablet Take 1 tablet (12.5 mg total) by mouth every 6 (six) hours as needed for nausea or vomiting. 30 tablet 2   Rimegepant Sulfate  (NURTEC) 75 MG TBDP Take 1 tablet by mouth as needed.     topiramate  (TOPAMAX ) 50 MG tablet Take 1 tablet (50 mg total) by mouth daily. May also take 1 tablet (50 mg total) daily as needed (daily during menstrual cycle). 180 tablet 3   traZODone  (DESYREL ) 100 MG tablet Take 1 tablet (100 mg total) by mouth at bedtime. 90 tablet 0   Ubrogepant  (UBRELVY ) 100 MG TABS Take 1 tablet onset migraine may repeat in 2 hours if needed. (Max 2 tabs/ 24 hrs) 16 tablet 11   vitamin B-12 (CYANOCOBALAMIN ) 500 MCG tablet Take 500 mcg by mouth daily.     No current facility-administered medications for this visit.    Medication Side Effects: None  Allergies:  Allergies[1]  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   Acute flank pain 06/02/2022   Angio-edema 04/27/2016   Angioedema    Aquagenic angio-edema-urticaria    Asthma    no problems /not used recently   Dysautonomia [facility])    Dysrhythmia    sinus tachycardia   Fibroid    right breast, adenoma   Headache(784.0)    History of COVID-19 11/21/2020   Hives 12/22/2015   Pneumonia    hx  6th grade   Sensation of fullness in both ears 02/02/2023   Snoring 02/11/2022   Urinary frequency 02/12/2019   Vaginal yeast infection 06/02/2022   Vasculitis     Family History  Problem Relation Age of Onset   Depression Mother    Migraines Mother    Narcolepsy Mother    Hypothyroidism Mother    Breast cancer Maternal Grandmother  51   Myasthenia gravis Maternal Grandfather    Asthma Other    Cancer Other    Epilepsy Other    Allergic rhinitis Neg Hx    Angioedema Neg Hx    Atopy Neg Hx    Eczema Neg Hx    Immunodeficiency Neg Hx    Urticaria Neg Hx    Stroke Neg Hx     Social History   Socioeconomic History   Marital status: Significant Other    Spouse name: Not on file   Number of children: Not on file   Years of education: Not on file   Highest education level: Bachelor's degree (e.g., BA, AB, BS)  Occupational History   Not on file  Tobacco Use   Smoking status: Never   Smokeless tobacco: Never  Vaping Use   Vaping status: Never Used  Substance and Sexual Activity   Alcohol use: No   Drug use: No   Sexual activity: Yes  Other Topics Concern   Not on file  Social History Narrative   Lives with fiance   R handed   Caffeine : 1 drink a day   Social Drivers of Health   Tobacco Use: Low Risk (10/26/2024)   Patient History    Smoking Tobacco Use: Never    Smokeless Tobacco Use: Never    Passive Exposure: Not on file  Financial Resource Strain: Not on file  Food Insecurity: No Food Insecurity (01/09/2024)   Hunger Vital  Sign    Worried About Running Out of Food in the Last Year: Never true    Ran Out of Food in the Last Year: Never true  Transportation Needs: No Transportation Needs (01/09/2024)   PRAPARE - Administrator, Civil Service (Medical): No    Lack of Transportation (Non-Medical): No  Physical Activity: Not on file  Stress: Not on file  Social Connections: Not on file  Intimate Partner Violence: Not At Risk (01/09/2024)   Humiliation, Afraid, Rape, and Kick questionnaire    Fear of Current or Ex-Partner: No    Emotionally Abused: No    Physically Abused: No    Sexually Abused: No  Depression (PHQ2-9): Low Risk (10/02/2024)   Depression (PHQ2-9)    PHQ-2 Score: 0  Alcohol Screen: Not on file  Housing: Unknown (04/11/2024)   Received from [facility]   Epic    Unable to Pay for Housing in the Last Year: Not on file    Number of Times Moved in the Last Year: Not on file    At any time in the past 12 months, were you homeless or living in a shelter (including now)?: No  Utilities: Not At Risk (01/09/2024)   [facility] Utilities    Threatened with loss of utilities: No  Health Literacy: Not on file    Past Medical History, Surgical history, Social history, and Family history were reviewed and updated as appropriate.   Please see review of systems for further details on the patient's review from today.   Objective:   Physical Exam:  LMP 10/01/2024   Physical Exam Constitutional:      General: She is not in acute distress. Musculoskeletal:        General: No deformity.  Neurological:     Mental Status: She is alert and oriented to person, place, and time.     Coordination: Coordination normal.  Psychiatric:        Attention and Perception: Attention and perception normal. She does not  perceive auditory or visual hallucinations.        Mood and Affect: Mood normal. Mood is not anxious or depressed. Affect is not labile, blunt, angry or inappropriate.         Speech: Speech normal.        Behavior: Behavior normal.        Thought Content: Thought content normal. Thought content is not paranoid or delusional. Thought content does not include homicidal or suicidal ideation. Thought content does not include homicidal or suicidal plan.        Cognition and Memory: Cognition and memory normal.        Judgment: Judgment normal.     Comments: Insight intact     Lab Review:     Component Value Date/Time   NA 139 08/09/2024 0934   K 4.0 08/09/2024 0934   CL 103 08/09/2024 0934   CO2 20 08/09/2024 0934   GLUCOSE 78 08/09/2024 0934   GLUCOSE 122 (H) 02/24/2024 1521   BUN 10 08/09/2024 0934   CREATININE 0.87 08/09/2024 0934   CALCIUM  9.5 08/09/2024 0934   CALCIUM  8.9 01/07/2024 2231   PROT 7.6 08/09/2024 0934   ALBUMIN 5.0 08/09/2024 0934   AST 14 08/09/2024 0934   ALT 9 08/09/2024 0934   ALKPHOS 118 (H) 08/09/2024 0934   BILITOT 0.5 08/09/2024 0934   GFRNONAA >60 02/24/2024 1515   GFRAA >60 12/01/2017 1018       Component Value Date/Time   WBC 6.1 08/09/2024 0934   WBC 9.3 02/24/2024 1515   RBC 5.06 08/09/2024 0934   RBC 4.89 02/24/2024 1515   HGB 14.8 08/09/2024 0934   HCT 45.8 08/09/2024 0934   PLT 300 08/09/2024 0934   MCV 91 08/09/2024 0934   MCH 29.2 08/09/2024 0934   MCH 29.0 02/24/2024 1515   MCHC 32.3 08/09/2024 0934   MCHC 33.6 02/24/2024 1515   RDW 12.8 08/09/2024 0934   LYMPHSABS 1.7 08/09/2024 0934   MONOABS 0.8 02/24/2024 1515   EOSABS 0.0 08/09/2024 0934   BASOSABS 0.0 08/09/2024 0934    No results found for: POCLITH, LITHIUM   No results found for: PHENYTOIN, PHENOBARB, VALPROATE, CBMZ   .res Assessment: Plan:    Plan:  PDMP reviewed  Trazodone  100mg  at hs Ativan  1mg  BID prn anxiety  Prozac  40mg  daily for anxiety and depression Buspar  10mg  TID   Genesight testing on file - reviewed.  RTC 8 weeks  25 minutes spent dedicated to the care of this patient on the date of this encounter to  include pre-visit review of records, ordering of medication, post visit documentation, and face-to-face time with the patient discussing GAD, OCD, panic disorder and insomnia. Discussed continuing current medication regimen.  Patient advised to contact office with any questions, adverse effects, or acute worsening in signs and symptoms.  Discussed potential benefits, risk, and side effects of benzodiazepines to include potential risk of tolerance and dependence, as well as possible drowsiness.  Advised patient not to drive if experiencing drowsiness and to take lowest possible effective dose to minimize risk of dependence and tolerance.   There are no diagnoses linked to this encounter.   Please see After Visit Summary for patient specific instructions.  Future Appointments  Date Time Provider Department Center  11/09/2024 11:30 AM Kehlani Vancamp, Angeline Mattocks, NP [facility] None  11/22/2024  4:00 PM Cottle, Peggye MATSU, LCSW [facility] None  12/07/2024  3:00 PM Cottle, Bambi G, LCSW [facility] None  12/21/2024 10:00 AM Cottle, Bambi G, LCSW  [facility] None  01/01/2025  3:30 PM Cary, Amy, NP [facility] None  02/26/2025  4:00 PM Penumalli, Eduard SAUNDERS, MD [facility] None  04/02/2025 11:30 AM Kirsteins, Prentice BRAVO, MD [facility] CPR    No orders of the defined types were placed in this encounter.     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

## 2024-11-14 ENCOUNTER — Other Ambulatory Visit: Payer: Self-pay

## 2024-11-14 ENCOUNTER — Other Ambulatory Visit: Payer: Self-pay | Admitting: Family Medicine

## 2024-11-15 ENCOUNTER — Other Ambulatory Visit: Payer: Self-pay

## 2024-11-15 ENCOUNTER — Other Ambulatory Visit (HOSPITAL_COMMUNITY): Payer: Self-pay

## 2024-11-15 ENCOUNTER — Encounter: Payer: Self-pay | Admitting: Family Medicine

## 2024-11-15 MED ORDER — EMGALITY 120 MG/ML ~~LOC~~ SOAJ
120.0000 mg | SUBCUTANEOUS | 2 refills | Status: AC
  Filled 2024-11-15 (×2): qty 3, 90d supply, fill #0

## 2024-11-22 ENCOUNTER — Ambulatory Visit: Admitting: Psychology

## 2024-11-22 DIAGNOSIS — F429 Obsessive-compulsive disorder, unspecified: Secondary | ICD-10-CM

## 2024-11-22 DIAGNOSIS — F330 Major depressive disorder, recurrent, mild: Secondary | ICD-10-CM

## 2024-11-22 DIAGNOSIS — F411 Generalized anxiety disorder: Secondary | ICD-10-CM

## 2024-11-22 NOTE — Progress Notes (Signed)
 " [facility] Counselor/Therapist Progress Note  Patient ID: Faith Jordan, MRN: [id number],    Date:  11/22/2024  Time Spent: 55 minutes  Time in:4:06 Time out: 5:01  Treatment Type:- The patient was seen via video visit.  She gave verbal consent for the session to be on video on caregility.  The patient was in her home alone and therapist was in the office.  Reported Symptoms: anxiety, panic attacks, sadness  Mental Status Exam: Appearance:  Casual     Behavior: Appropriate  Motor: Normal  Speech/Language:  Normal Rate  Affect: blunted  Mood: sad  Thought process: normal  Thought content:   WNL  Sensory/Perceptual disturbances:   WNL  Orientation: oriented to person, place, time/date, and situation  Attention: Good  Concentration: Good  Memory: WNL  Fund of knowledge:  Good  Insight:   Fair  Judgment:  Good  Impulse Control: Good   Risk Assessment: Danger to Self:  No Self-injurious Behavior: No Danger to Others: No Duty to Warn:no Physical Aggression / Violence:No  Access to Firearms a concern: No  Gang Involvement:No   Subjective: The patient was seen for an individual therapy session  via video visit today.  The patient gave verbal permission for the session to be on video on caregility and she is aware of the limitations of telehealth.  The patient was in her home alone and therapist was in the office.  The patient presents with a blunted affect and her mood is sad.  The patient reports that she has had 2 panic attacks since I last saw her.  She reports that she read a book while she was out for the snow and the book was a love story.  She reports that she did not know that she was going to have the panic attacks.  We talked about why she potentially could have had the panic attacks and I explained to her that she likely had some sort of negative thoughts that led her down the path that of being anxious.  When we explored it further it seems that she  had told herself that she has never had a relationship like the one that was in the book and that caused her to feel anxious and like she was not going to have a relationship like that.  We talked again about how thoughts work in her brain and that she is going to have to work with herself to make her thoughts more neutral or positive.  I gave her some specific examples of what I wanted her to do to change her thinking.  In addition I asked her to write down 3 good things before she goes to bed every night between now and the time I see her.  Interventions: Cognitive Behavioral Therapy and Assertiveness/Communication,, problem solving, psychoeducation,  EMDR as indicated, Meditation and mindfulness,   Diagnosis:GAD (generalized anxiety disorder)  Obsessive-compulsive disorder, unspecified type  Major depressive disorder, recurrent episode, mild  Plan: Client Abilities/Strengths  Intelligent, insightful, motivated  Client Treatment Preferences  Outpatient Individual therapy every other week  Client Statement of Needs   I feel better now, but still need some help with anxiety  Treatment Level  Outpatient Individual therapy  Symptoms  Frustration and anxiety related to providing oversight and caretaking to an aging, ailing, and dependent  parent.: No Description Entered (Status: improved). Hypervigilance (e.g., feeling constantly on edge,  experiencing concentration difficulties, having trouble falling or staying asleep, exhibiting a general  state of irritability).:  No Description Entered (Status: improved). Motor tension (e.g., restlessness,  tiredness, shakiness, muscle tension).: No Description Entered (Status: improved).  Problems Addressed  Anxiety, Phase Of Life Problems, Anxiety  Goals 1. Learn and implement coping skills that result in a reduction of anxiety  and worry, and improved daily functioning. Objective Learn and implement calming skills to reduce overall anxiety and  manage anxiety symptoms. Target Date: 2026-08-09Frequency: Weekly Progress: 60 Modality: individual  Related Interventions 1. Teach the client calming/relaxation skills (e.g., applied relaxation, progressive muscle  relaxation, cue controlled relaxation; mindful breathing; biofeedback) and how to discriminate  better between relaxation and tension; teach the client how to apply these skills to his/her daily  life (e.g., New Directions in Progressive Muscle Relaxation by Thornell Collier, and  Hazlett-Stevens; Treating Generalized Anxiety Disorder by Rygh and Red). Objective Identify, challenge, and replace biased, fearful self-talk with positive, realistic, and empowering selftalk. Target Date: 2025-05-26 Frequency: weekly Progress: 60 Modality: individual Related Interventions 1. Explore the client's schema and self-talk that mediate his/her fear response; assist him/her in  challenging the biases; replace the distorted messages with reality-based alternatives and  positive, realistic self-talk that will increase his/her self-confidence in coping with irrational  fears (see Cognitive Therapy of Anxiety Disorders by Gretta armin Mon). Objective Learn and implement problem-solving strategies for realistically addressing worries. Target Date: 2026-08-09Frequency: weekly Progress: 60 Modality: individual 2. Resolve conflicted feelings and adapt to the new life circumstances. Objective Apply problem-solving skills to current circumstances. Target Date: 2025-05-26 Frequency: weekly Progress: 40 Modality: individual Related Interventions 1. Teach the client problem-resolution skills (e.g., defining the problem clearly, brainstorming  multiple solutions, listing the pros and cons of each solution, seeking input from others,  selecting and implementing a plan of action, evaluating outcome, and readjusting plan as  necessary).   3. Stabilize anxiety level while increasing ability to  function on a daily  basis. Diagnosis F33.1  Major depressive disorder, moderate 300.02 (Generalized anxiety disorder) - Open - [Signifier: n/a]  Axis  none 309.28 (Adjustment disorder with mixed anxiety and depressed  mood) - Open - [Signifier: n/a]  Adjustment Disorder,  With Anxiety   Marital conflict  Major Depressive disorder, moderate  Conditions For Discharge Achievement of treatment goals and objectives.  The patient approved this plan.   Marckus Hanover KANDICE Macintosh, LCSW                    Interventions: Cognitive Behavioral Therapy and Assertiveness/Communication,, problem solving, psychoeducation,  EMDR as indicated, Meditation and mindfulness,   Diagnosis:GAD (generalized anxiety disorder)  Obsessive-compulsive disorder, unspecified type  Major depressive disorder, recurrent episode, mild  Plan: Client Abilities/Strengths  Intelligent, insightful, motivated  Client Treatment Preferences  Outpatient Individual therapy every other week  Client Statement of Needs   I feel better now, but still need some help with anxiety  Treatment Level  Outpatient Individual therapy  Symptoms  Frustration and anxiety related to providing oversight and caretaking to an aging, ailing, and dependent  parent.: No Description Entered (Status: improved). Hypervigilance (e.g., feeling constantly on edge,  experiencing concentration difficulties, having trouble falling or staying asleep, exhibiting a general  state of irritability).: No Description Entered (Status: improved). Motor tension (e.g., restlessness,  tiredness, shakiness, muscle tension).: No Description Entered (Status: improved).  Problems Addressed  Anxiety, Phase Of Life Problems, Anxiety  Goals 1. Learn and implement coping skills that result in a reduction of anxiety  and worry, and improved daily functioning. Objective Learn and implement calming skills to reduce  overall anxiety and manage anxiety  symptoms. Target Date: 2026-08-09Frequency: Weekly Progress: 60 Modality: individual  Related Interventions 1. Teach the client calming/relaxation skills (e.g., applied relaxation, progressive muscle  relaxation, cue controlled relaxation; mindful breathing; biofeedback) and how to discriminate  better between relaxation and tension; teach the client how to apply these skills to his/her daily  life (e.g., New Directions in Progressive Muscle Relaxation by Thornell Collier, and  Hazlett-Stevens; Treating Generalized Anxiety Disorder by Rygh and Red). Objective Identify, challenge, and replace biased, fearful self-talk with positive, realistic, and empowering selftalk. Target Date: 2025-05-26 Frequency: weekly Progress: 60 Modality: individual Related Interventions 1. Explore the client's schema and self-talk that mediate his/her fear response; assist him/her in  challenging the biases; replace the distorted messages with reality-based alternatives and  positive, realistic self-talk that will increase his/her self-confidence in coping with irrational  fears (see Cognitive Therapy of Anxiety Disorders by Gretta armin Mon). Objective Learn and implement problem-solving strategies for realistically addressing worries. Target Date: 2026-08-09Frequency: weekly Progress: 60 Modality: individual 2. Resolve conflicted feelings and adapt to the new life circumstances. Objective Apply problem-solving skills to current circumstances. Target Date: 2025-05-26 Frequency: weekly Progress: 40 Modality: individual Related Interventions 1. Teach the client problem-resolution skills (e.g., defining the problem clearly, brainstorming  multiple solutions, listing the pros and cons of each solution, seeking input from others,  selecting and implementing a plan of action, evaluating outcome, and readjusting plan as  necessary).   3. Stabilize anxiety level while increasing ability to function on a  daily  basis. Diagnosis F33.1  Major depressive disorder, moderate 300.02 (Generalized anxiety disorder) - Open - [Signifier: n/a]  Axis  none 309.28 (Adjustment disorder with mixed anxiety and depressed  mood) - Open - [Signifier: n/a]  Adjustment Disorder,  With Anxiety   Marital conflict  Major Depressive disorder, moderate  Conditions For Discharge Achievement of treatment goals and objectives.  The patient approved this plan.   Kamilya Wakeman G Catrina Fellenz, LCSW                                                                                                     "

## 2024-11-26 ENCOUNTER — Ambulatory Visit: Payer: Commercial Managed Care - PPO | Admitting: Family Medicine

## 2024-12-07 ENCOUNTER — Ambulatory Visit: Admitting: Psychology

## 2024-12-21 ENCOUNTER — Ambulatory Visit: Admitting: Psychology

## 2024-12-24 ENCOUNTER — Ambulatory Visit: Admitting: Family Medicine

## 2025-01-01 ENCOUNTER — Ambulatory Visit: Admitting: Family Medicine

## 2025-01-07 ENCOUNTER — Telehealth: Admitting: Adult Health

## 2025-02-26 ENCOUNTER — Ambulatory Visit: Admitting: Diagnostic Neuroimaging

## 2025-04-02 ENCOUNTER — Encounter: Admitting: Physical Medicine & Rehabilitation
